# Patient Record
Sex: Female | Born: 1935
Health system: Southern US, Community
[De-identification: ages and names within clinical notes are randomized; demographics above are authoritative.]

## PROBLEM LIST (undated history)

## (undated) DIAGNOSIS — T4145XA Adverse effect of unspecified anesthetic, initial encounter: Secondary | ICD-10-CM

## (undated) DIAGNOSIS — I4819 Other persistent atrial fibrillation: Secondary | ICD-10-CM

## (undated) DIAGNOSIS — Z950 Presence of cardiac pacemaker: Secondary | ICD-10-CM

## (undated) DIAGNOSIS — C801 Malignant (primary) neoplasm, unspecified: Secondary | ICD-10-CM

## (undated) DIAGNOSIS — I4719 Other supraventricular tachycardia: Secondary | ICD-10-CM

## (undated) DIAGNOSIS — I340 Nonrheumatic mitral (valve) insufficiency: Secondary | ICD-10-CM

## (undated) DIAGNOSIS — N289 Disorder of kidney and ureter, unspecified: Secondary | ICD-10-CM

## (undated) DIAGNOSIS — I10 Essential (primary) hypertension: Secondary | ICD-10-CM

## (undated) DIAGNOSIS — R112 Nausea with vomiting, unspecified: Secondary | ICD-10-CM

## (undated) DIAGNOSIS — I5042 Chronic combined systolic (congestive) and diastolic (congestive) heart failure: Secondary | ICD-10-CM

## (undated) DIAGNOSIS — I351 Nonrheumatic aortic (valve) insufficiency: Secondary | ICD-10-CM

## (undated) DIAGNOSIS — E875 Hyperkalemia: Secondary | ICD-10-CM

## (undated) DIAGNOSIS — N183 Chronic kidney disease, stage 3 unspecified: Secondary | ICD-10-CM

## (undated) DIAGNOSIS — E213 Hyperparathyroidism, unspecified: Secondary | ICD-10-CM

## (undated) DIAGNOSIS — I495 Sick sinus syndrome: Secondary | ICD-10-CM

## (undated) DIAGNOSIS — Z9289 Personal history of other medical treatment: Secondary | ICD-10-CM

## (undated) DIAGNOSIS — M1990 Unspecified osteoarthritis, unspecified site: Secondary | ICD-10-CM

## (undated) DIAGNOSIS — Z8669 Personal history of other diseases of the nervous system and sense organs: Secondary | ICD-10-CM

## (undated) DIAGNOSIS — I471 Supraventricular tachycardia: Secondary | ICD-10-CM

## (undated) DIAGNOSIS — I420 Dilated cardiomyopathy: Secondary | ICD-10-CM

## (undated) DIAGNOSIS — I493 Ventricular premature depolarization: Secondary | ICD-10-CM

## (undated) DIAGNOSIS — Z8639 Personal history of other endocrine, nutritional and metabolic disease: Secondary | ICD-10-CM

## (undated) DIAGNOSIS — G459 Transient cerebral ischemic attack, unspecified: Secondary | ICD-10-CM

## (undated) DIAGNOSIS — R0602 Shortness of breath: Secondary | ICD-10-CM

## (undated) DIAGNOSIS — Z9889 Other specified postprocedural states: Secondary | ICD-10-CM

## (undated) DIAGNOSIS — E7800 Pure hypercholesterolemia, unspecified: Secondary | ICD-10-CM

## (undated) DIAGNOSIS — T8859XA Other complications of anesthesia, initial encounter: Secondary | ICD-10-CM

## (undated) DIAGNOSIS — K219 Gastro-esophageal reflux disease without esophagitis: Secondary | ICD-10-CM

## (undated) HISTORY — PX: TOE AMPUTATION: SHX809

## (undated) HISTORY — DX: Other specified postprocedural states: Z98.890

## (undated) HISTORY — DX: Supraventricular tachycardia: I47.1

## (undated) HISTORY — DX: Other supraventricular tachycardia: I47.19

## (undated) HISTORY — DX: Nonrheumatic aortic (valve) insufficiency: I35.1

## (undated) HISTORY — DX: Personal history of other diseases of the nervous system and sense organs: Z86.69

## (undated) HISTORY — PX: BUNIONECTOMY: SHX129

## (undated) HISTORY — PX: FOOT SURGERY: SHX648

## (undated) HISTORY — DX: Other persistent atrial fibrillation: I48.19

## (undated) HISTORY — PX: SHOULDER SURGERY: SHX246

## (undated) HISTORY — PX: EYE SURGERY: SHX253

## (undated) HISTORY — DX: Ventricular premature depolarization: I49.3

## (undated) HISTORY — DX: Disorder of kidney and ureter, unspecified: N28.9

## (undated) HISTORY — PX: VARICOSE VEIN SURGERY: SHX832

## (undated) HISTORY — DX: Hyperkalemia: E87.5

## (undated) HISTORY — DX: Sick sinus syndrome: I49.5

## (undated) HISTORY — DX: Chronic combined systolic (congestive) and diastolic (congestive) heart failure: I50.42

## (undated) HISTORY — DX: Dilated cardiomyopathy: I42.0

## (undated) HISTORY — DX: Nonrheumatic mitral (valve) insufficiency: I34.0

## (undated) HISTORY — PX: TONSILLECTOMY: SUR1361

## (undated) HISTORY — DX: Chronic kidney disease, stage 3 unspecified: N18.30

## (undated) HISTORY — DX: Personal history of other endocrine, nutritional and metabolic disease: Z86.39

## (undated) HISTORY — PX: SHOULDER ARTHROSCOPY W/ ROTATOR CUFF REPAIR: SHX2400

---

## 2000-04-19 ENCOUNTER — Encounter: Admission: RE | Admit: 2000-04-19 | Discharge: 2000-04-19 | Payer: Self-pay | Admitting: *Deleted

## 2000-04-19 ENCOUNTER — Encounter: Payer: Self-pay | Admitting: *Deleted

## 2000-09-25 HISTORY — PX: CARDIAC CATHETERIZATION: SHX172

## 2000-09-28 ENCOUNTER — Ambulatory Visit (HOSPITAL_COMMUNITY): Admission: RE | Admit: 2000-09-28 | Discharge: 2000-09-29 | Payer: Self-pay | Admitting: *Deleted

## 2001-12-23 ENCOUNTER — Encounter: Payer: Self-pay | Admitting: *Deleted

## 2001-12-23 ENCOUNTER — Ambulatory Visit (HOSPITAL_COMMUNITY): Admission: RE | Admit: 2001-12-23 | Discharge: 2001-12-23 | Payer: Self-pay | Admitting: *Deleted

## 2002-06-12 ENCOUNTER — Inpatient Hospital Stay (HOSPITAL_COMMUNITY): Admission: AD | Admit: 2002-06-12 | Discharge: 2002-06-15 | Payer: Self-pay | Admitting: Cardiology

## 2002-07-11 ENCOUNTER — Encounter: Payer: Self-pay | Admitting: Gastroenterology

## 2002-07-11 ENCOUNTER — Encounter: Admission: RE | Admit: 2002-07-11 | Discharge: 2002-07-11 | Payer: Self-pay | Admitting: Gastroenterology

## 2002-07-14 ENCOUNTER — Encounter: Admission: RE | Admit: 2002-07-14 | Discharge: 2002-07-14 | Payer: Self-pay | Admitting: Endocrinology

## 2002-07-24 ENCOUNTER — Encounter: Payer: Self-pay | Admitting: Endocrinology

## 2002-07-24 ENCOUNTER — Encounter: Admission: RE | Admit: 2002-07-24 | Discharge: 2002-07-24 | Payer: Self-pay | Admitting: Endocrinology

## 2002-11-20 ENCOUNTER — Inpatient Hospital Stay (HOSPITAL_COMMUNITY): Admission: AD | Admit: 2002-11-20 | Discharge: 2002-11-22 | Payer: Self-pay | Admitting: Cardiology

## 2003-10-13 ENCOUNTER — Emergency Department (HOSPITAL_COMMUNITY): Admission: EM | Admit: 2003-10-13 | Discharge: 2003-10-13 | Payer: Self-pay | Admitting: Emergency Medicine

## 2003-12-19 ENCOUNTER — Ambulatory Visit (HOSPITAL_COMMUNITY): Admission: RE | Admit: 2003-12-19 | Discharge: 2003-12-19 | Payer: Self-pay | Admitting: Cardiology

## 2004-02-14 ENCOUNTER — Encounter: Admission: RE | Admit: 2004-02-14 | Discharge: 2004-02-14 | Payer: Self-pay | Admitting: Family Medicine

## 2004-08-07 ENCOUNTER — Encounter: Admission: RE | Admit: 2004-08-07 | Discharge: 2004-08-07 | Payer: Self-pay | Admitting: Endocrinology

## 2004-12-02 ENCOUNTER — Ambulatory Visit (HOSPITAL_COMMUNITY): Admission: RE | Admit: 2004-12-02 | Discharge: 2004-12-02 | Payer: Self-pay | Admitting: Cardiology

## 2006-03-16 ENCOUNTER — Ambulatory Visit (HOSPITAL_COMMUNITY): Admission: RE | Admit: 2006-03-16 | Discharge: 2006-03-16 | Payer: Self-pay | Admitting: Cardiology

## 2007-07-28 ENCOUNTER — Encounter: Admission: RE | Admit: 2007-07-28 | Discharge: 2007-07-28 | Payer: Self-pay | Admitting: Cardiology

## 2007-08-22 ENCOUNTER — Ambulatory Visit (HOSPITAL_COMMUNITY): Admission: RE | Admit: 2007-08-22 | Discharge: 2007-08-22 | Payer: Self-pay | Admitting: Cardiology

## 2007-11-17 ENCOUNTER — Other Ambulatory Visit: Admission: RE | Admit: 2007-11-17 | Discharge: 2007-11-17 | Payer: Self-pay | Admitting: Obstetrics & Gynecology

## 2007-11-28 ENCOUNTER — Encounter: Admission: RE | Admit: 2007-11-28 | Discharge: 2007-11-28 | Payer: Self-pay | Admitting: Obstetrics & Gynecology

## 2008-07-30 ENCOUNTER — Encounter: Admission: RE | Admit: 2008-07-30 | Discharge: 2008-07-30 | Payer: Self-pay | Admitting: Cardiology

## 2008-08-06 ENCOUNTER — Ambulatory Visit (HOSPITAL_COMMUNITY): Admission: RE | Admit: 2008-08-06 | Discharge: 2008-08-06 | Payer: Self-pay | Admitting: Cardiology

## 2009-09-02 ENCOUNTER — Ambulatory Visit (HOSPITAL_COMMUNITY): Admission: RE | Admit: 2009-09-02 | Discharge: 2009-09-02 | Payer: Self-pay | Admitting: Cardiology

## 2009-10-28 DIAGNOSIS — I10 Essential (primary) hypertension: Secondary | ICD-10-CM

## 2009-10-28 DIAGNOSIS — I4819 Other persistent atrial fibrillation: Secondary | ICD-10-CM

## 2009-10-29 ENCOUNTER — Ambulatory Visit: Payer: Self-pay | Admitting: Critical Care Medicine

## 2009-10-29 ENCOUNTER — Telehealth: Payer: Self-pay | Admitting: Critical Care Medicine

## 2009-10-29 DIAGNOSIS — E785 Hyperlipidemia, unspecified: Secondary | ICD-10-CM | POA: Insufficient documentation

## 2009-10-29 DIAGNOSIS — R942 Abnormal results of pulmonary function studies: Secondary | ICD-10-CM | POA: Insufficient documentation

## 2010-05-12 ENCOUNTER — Encounter: Admission: RE | Admit: 2010-05-12 | Discharge: 2010-05-12 | Payer: Self-pay | Admitting: Gastroenterology

## 2010-08-25 ENCOUNTER — Ambulatory Visit (HOSPITAL_COMMUNITY): Admission: RE | Admit: 2010-08-25 | Discharge: 2010-08-25 | Payer: Self-pay | Admitting: Cardiology

## 2010-09-02 ENCOUNTER — Encounter: Payer: Self-pay | Admitting: Critical Care Medicine

## 2010-11-16 ENCOUNTER — Encounter: Payer: Self-pay | Admitting: Family Medicine

## 2010-11-25 NOTE — Progress Notes (Signed)
Summary: test results/PFTs received  Phone Note Outgoing Call   Reason for Call: Discuss lab or test results Summary of Call: Call the pt and tell her CXR is normal.  She should stay on pacerone for now.  I am awaiting results of prior PFT results and will call her when available   Initial call taken by: Storm Frisk MD,  October 29, 2009 3:02 PM  Follow-up for Phone Call        Crystal, the PFT results are in PW's look at folder awaiting his review.Michel Bickers CMA  October 29, 2009 3:33 PM  ATC pt.  [employer].  will try back later.  Gweneth Dimitri RN  October 29, 2009 3:41 PM   Additional Follow-up for Phone Call Additional follow up Details #1::        called, spoke with pt.  Pt informed of cxr results and recs stated above per PW.  She verbalized understanding of instructions.  Pt aware we will call back after PW reveiws prior PFTs.  PFTs are in PW's to do folder.  Will forward to PW as FYI.   Additional Follow-up by: Gweneth Dimitri RN,  October 29, 2009 3:57 PM    Additional Follow-up for Phone Call Additional follow up Details #2::    noted   Follow-up by: Storm Frisk MD,  October 30, 2009 12:30 PM

## 2010-11-25 NOTE — Assessment & Plan Note (Signed)
Summary: Pulmonary Consultation   Copy to:  Dr. Armanda Magic Primary Provider/Referring Provider:  Dr. Tally Joe  CC:  Pulmonary Consult-abnormal PFT.  occ SOB with activity x15yr.Marland Kitchen  History of Present Illness: Pulmonary Consultation  77 yo WF referred for eval of abn PFTs, low DLCO on amiodarone.  ? is there pulmonary toxicity from amiodarone.       This is a 77 year old female who presents for consultation at the request of Dr. Armanda Magic for initial Pulmonary consult.  The patient presents with fatigue and shortness of breath with exertion, but has no history of fever, failure to thrive, weight loss, night sweats, nosebleeds, easy bruising, heartburn, nausea/vomiting, unable to take oral fluids/nourishment, chest pain, cough, shortness of breath, hemoptysis, joint swelling, leg swelling, peripheral edema, and URI symptoms.  Today's visit is for evaluation only.  The patient's current symptoms are unchanged.  Prior evaluation and treatment (see reports for full details) has included CXR and PFT's.  The patient has questions or concerns today regarding diagnosis.   Pt with hx of chronic atrial fibrillation.  PT on pacerone 100mg /d for 6years.   Now: only dyspneic with severe exertion, more just fatigued.  No real cough.  If lays flat will cough.  No wheeze.  No prior asthma or copd dx.  Never smoker.   No chest pain.  No mucous.  Notes some edema in ankles.   If goes shopping gets fatigued with exertion.  Preventive Screening-Counseling & Management  Alcohol-Tobacco     Smoking Status: never     Passive Smoke Exposure: yes  Comments: father was a smoker   Current Medications (verified): 1)  Multivitamins  Tabs (Multiple Vitamin) .... Once Daily 2)  Simvastatin 20 Mg Tabs (Simvastatin) .... Once Daily 3)  Amlodipine Besylate 2.5 Mg Tabs (Amlodipine Besylate) .... Once Daily 4)  Lorazepam 0.5 Mg Tabs (Lorazepam) .... As Needed 5)  Metoprolol Succinate 50 Mg Xr24h-Tab (Metoprolol  Succinate) .... 1/2 Tab Once Daily 6)  Pacerone 200 Mg Tabs (Amiodarone Hcl) .... 1/2 Tab Once Daily 7)  Furosemide 20 Mg Tabs (Furosemide) .... Once Daily 8)  Coumadin 5 Mg Tabs (Warfarin Sodium) .... As Directed 9)  Prilosec Otc 20 Mg Tbec (Omeprazole Magnesium) .... Once Daily  Allergies (verified): No Known Drug Allergies  Past History:  Past medical, surgical, family and social histories (including risk factors) reviewed, and no changes noted (except as noted below).  Past Medical History: HYPERLIPIDEMIA (ICD-272.4) HYPERTENSION (ICD-401.9) PAROXYSMAL ATRIAL FIBRILLATION (ICD-427.31) Abnormal DLCO on Amiodarone     -DLCO:  2007:61%    2008:80%  2009:65%   2010: 50%  Past Surgical History: c section x2 varicose vein stripping foot surgery knee surgery rotator cuff Appendectomy  Family History: Reviewed history and no changes required. heart disease-mother, father sister deceased from breast ca  Social History: Reviewed history and no changes required. Patient never smoked.  married 3 children Homemaker Smoking Status:  never Passive Smoke Exposure:  yes  Review of Systems       The patient complains of shortness of breath with activity, irregular heartbeats, acid heartburn, indigestion, and hand/feet swelling.  The patient denies shortness of breath at rest, productive cough, non-productive cough, coughing up blood, chest pain, loss of appetite, weight change, abdominal pain, difficulty swallowing, sore throat, tooth/dental problems, headaches, nasal congestion/difficulty breathing through nose, sneezing, itching, ear ache, anxiety, depression, joint stiffness or pain, rash, change in color of mucus, and fever.    Vital Signs:  Patient profile:  77 year old female Height:      65 inches Weight:      185.13 pounds BMI:     30.92 O2 Sat:      98 % on Room air Temp:     97.4 degrees F oral Pulse rate:   65 / minute BP sitting:   120 / 80  (left arm) Cuff  size:   regular  Vitals Entered By: Gweneth Dimitri RN (October 29, 2009 10:43 AM)  O2 Flow:  Room air CC: Pulmonary Consult-abnormal PFT.  occ SOB with activity x82yr. Comments Medications reviewed with patient Gweneth Dimitri RN  October 29, 2009 10:43 AM    Physical Exam  Additional Exam:  Gen: Pleasant, well-nourished, in no distress,  normal affect ENT: No lesions,  mouth clear,  oropharynx clear, no postnasal drip Neck: No JVD, no TMG, no carotid bruits Lungs: No use of accessory muscles, no dullness to percussion, clear without rales or rhonchi Cardiovascular: RRR, heart sounds normal, no murmur or gallops, no peripheral edema Abdomen: soft and NT, no HSM,  BS normal Musculoskeletal: No deformities, no cyanosis or clubbing Neuro: alert, non focal Skin: Warm, no lesions or rashes    CXR  Procedure date:  10/29/2009  Findings:      IMPRESSION: Stable chest x-ray with no evidence of acute cardiac or pulmonary process.    Pulmonary Function Test Date: 09/02/2009 Gender: Female  Pre-Spirometry FVC    Value: 2.90 L/min   Pred: 2.73 L/min     % Pred: 106 % FEV1    Value: 2.46 L     Pred: 1.91 L     % Pred: 129 % FEV1/FVC  Value: 85 %     Pred: 75 %    FEF 25-75  Value: 3.12 L/min   Pred: 2.17 L/min     % Pred: 107 %  Lung Volumes DLCO    Value: 24.26 %   % Pred: 50 % DLCO/VA  Value: 2.65 %   % Pred: 55 %  Comments: Moderate reduction in DLCO, with drop from 2009 values  Impression & Recommendations:  Problem # 1:  PULMONARY FUNCTION TESTS, ABNORMAL (ICD-794.2) Assessment Unchanged Abnormal DLCO in setting of amiodarone use.  CXR is normal on todays exam.  No physical findings of primary lung disease.Spirometry is normal.  Compared to 2007 DLCO has fallen from 65% pred to 50% pred.  This is consistent with either pulm vascular disease or a true diffusion defect due to amiodarone.  The lack of physical findings or CXR findings for pulm interstitial disease speaks  against this being from amiodarone especially since pt is on such a low dose. plan Repeat PFTs in 6months and track pts status closely Ok to continue low dose amiodarone for now. PT to call sooner if symptoms progress. Orders: New Patient Level IV (16109) T-2 View CXR (71020TC)  Complete Medication List: 1)  Multivitamins Tabs (Multiple vitamin) .... Once daily 2)  Simvastatin 20 Mg Tabs (Simvastatin) .... Once daily 3)  Amlodipine Besylate 2.5 Mg Tabs (Amlodipine besylate) .... Once daily 4)  Lorazepam 0.5 Mg Tabs (Lorazepam) .... As needed 5)  Metoprolol Succinate 50 Mg Xr24h-tab (Metoprolol succinate) .... 1/2 tab once daily 6)  Pacerone 200 Mg Tabs (Amiodarone hcl) .... 1/2 tab once daily 7)  Furosemide 20 Mg Tabs (Furosemide) .... Once daily 8)  Coumadin 5 Mg Tabs (Warfarin sodium) .... As directed 9)  Prilosec Otc 20 Mg Tbec (Omeprazole magnesium) .... Once daily  Patient Instructions: 1)  Ok to stay on Pacerone 2)  Obtain chest xray today 3)  I will call you with Chest xray results  4)  Suspect you will need repeat pulmonary funcitons in 6-8 months, I will review prior PFTs and let you know 5)  No specific return to pulmonary needed   Appended Document: Pulmonary Consultation fax Tally Joe;  Armanda Magic

## 2011-03-13 NOTE — H&P (Signed)
NAME:  Molly [name], Molly [name]                       ACCOUNT NO.:  [id number]   MEDICAL RECORD NO.:  [id number]                   PATIENT TYPE:  INP   LOCATION:  3741                                 FACILITY:  [facility]   PHYSICIAN:  Traci R. Mayford Knife, M.D.               DATE OF BIRTH:  1933/04/21   DATE OF ADMISSION:  06/12/2002  DATE OF DISCHARGE:                                HISTORY & PHYSICAL   PRIMARY CARE PHYSICIAN:  Tama Headings. Marina Goodell, M.D.   IMPRESSION:  (As dictated by Dr. Armanda Magic).  1. Paroxysmal atrial fibrillation; onset February 2003.     A. She is intolerant to Cardizem.  She had breakthrough atrial        fibrillation on Toprol.  She is now admitted for initiation of        vasotherapy for consistent maintenance of normal sinus rhythm.     B. (March 2003)  2-D echocardiogram revealing an ejection fraction of 45        to 50% with normal left ventricular size and function.     C. Cardiac catheterization in December 2001 revealing normal coronary        arteries (prior false positive Cardiolite).     D. The patient has had normal TSH checked in the past.  2. Systemic anticoagulation secondary to paroxysmal atrial fibrillation.  3. History of lower extremity swelling, improved on Lasix.  4. History of hypokalemia, prior daily dosing of K-Dur.  5. Dyspnea, question etiology, though has significantly improved.  She was     referred back to Dr. Marina Goodell for potential referral to a pulmonologist for     further evaluation of her shortness of breath.   PLAN:  (As dictated by Dr. Armanda Magic).  1. Admit to telemetry for initiation of Betapace therapy.  2. Monitor QTC interval for prolongation.  3. Baseline comprehensive metabolic panel and coagulation panel.  4. Continue low-dose K-Dur pending basic metabolic panel results.  5. Follow coagulation studies.  6. If the patient maintains normal sinus rhythm on Betapace, will consider     discontinuation of Coumadin therapy.   HISTORY OF PRESENT ILLNESS:  The patient is a very pleasant, 77 year old  female with a history of atrial fibrillation, onset approximately February  this year.  She has had intolerance to Cardizem in the past and breakthrough  atrial fibrillation on her Toprol dosing.  She has had a prior 2-D  echocardiogram revealing lower range of normal ejection fraction but normal  left ventricular size and function, and normal cardiac valves.  She is on  chronic Coumadin therapy.  As mentioned above, she is on Toprol and digoxin  as well.  The patient also has mild arthritis.   PAST SURGICAL HISTORY:  Significant for  1. Cesarean section x2.  2. Lower extremity vein stripping 30 years ago.  3. Tonsillectomy.  4. Right rotator cuff surgery.  5. Right knee arthroscopic  surgery.  6. Right bunionectomy.   ALLERGIES:  No known drug allergies.  Okay with seafood, shellfish and  iodinated products.   MEDICATIONS ON ADMISSION:  A.  Toprol XL 75 mg p.o. q.d.  B.  Lasix 20 mg p.o. q.d.  C.  Digitek 0.125 mg p.o. q.d.  D.  Coumadin 5 mg on Tuesday, Thursday, Saturday and Sunday, and 2.5 mg on  Monday, Wednesday and Friday.  E.  Vioxx p.r.n., not often taken.   SOCIAL HISTORY/HABITS:  The patient has been married for 50 years.  She is a  Futures trader.  She has three children alive and well.  She has a grandchild,  age 22 years, who she assists in the care of.  EtOH/tobacco:  Negative.   FAMILY HISTORY:  Noncontributory.   REVIEW OF SYSTEMS:  As in history of present illness and past surgical  history.  Otherwise, she denies problems with lightheadedness, dizziness,  syncope and near-syncopal episodes.  Denies current dyspnea.  Negative pedal  edema.  Negative orthopnea.  Negative paroxysmal nocturnal dyspnea.  Episodic dysphagia.  Denies constipation, diarrhea, melena and bright red  blood per rectum.  Negative for dysuria and hematuria.  Mild arthritic-type  complaints affecting her knees and right  shoulder.  She does wear glasses  and has upper partial dentures.  She has lost 15 pounds in the last four  months intentionally.   PHYSICAL EXAMINATION:  (As dictated by Dr. Armanda Magic)  VITAL SIGNS:  Blood pressure is 136/72, heart rate 64 and regular,  respiratory rate 18, temperature 98.2, oxygen saturations 97%.   GENERAL:  She is a well-developed, pleasant, and conversant, 77 year old  female in no acute distress.   HEAD, EYES, EARS, NOSE, THROAT:  Brisk bilateral carotid upstroke without  bruit.  No JVD and no thyromegaly.   CHEST:  Lung sounds clear, equal bilateral excursion.  Negative  costovertebral angle tenderness.   CARDIOVASCULAR:  Regular rate and rhythm without murmurs, rubs or gallops.  Normal S1 and S2.   ABDOMEN:  Soft, nondistended, normoactive bowel sounds.  Negative abdominal  aorta, renal or femoral bruit.  Nontender to applied pressure, no masses and  no organomegaly appreciated.   EXTREMITIES:  Slight lower extremity swelling, distal pulses intact.   NEUROLOGIC:  Cranial nerves II through XII grossly intact.  Alert and  oriented times three.   GENITAL/RECTAL:  Examination is deferred.   LABORATORY DATA:  Hemoglobin 13.9, hematocrit 40.8, white blood cell count  8.5, and platelet count 226,000.  Sodium 141, potassium 4.7, chloride 107,  CO2 28, BUN 16, creatinine 0.9, glucose 97.  Liver function tests are within  normal range.  Calcium is 10.2.  Chest x-ray from December 23, 2001,  revealed no active disease.  Pro time is 23.2, INR 2.4.    Salomon Fick, N.P.                       Traci R. Mayford Knife, M.D.   MES/MEDQ  D:  06/12/2002  T:  06/14/2002  Job:  [id number]

## 2011-03-13 NOTE — Cardiovascular Report (Signed)
[place]  Patient:    Molly [name], Molly [name]                    MRN: [id number] Adm. Date:  [id number] Attending:  Mora Appl CC:         Willis Modena. Dreiling, M.D.   Cardiac Catheterization  INDICATIONS:  Paroxysmal atrial fibrillation and fixed defects in the anteroapical region without redistribution.  There was anterior hypokinesis on echo associated with poor exercise tolerance.  The patient was only able to exercise for 3 minutes 47 seconds utilizing Bruce protocol.  Based on these findings, it was elected to proceed with left heart catheterization.  DESCRIPTION OF PROCEDURE:  After obtaining written informed consent, the patient was brought to the cardiac catheterization lab in the postabsorptive state.  Preoperative sedation was achied using Valium p.o. and IV Versed. Following appropriate sedation, both groins were prepped and draped in the usual sterile fashion.  Access to the right femoral artery was obtained, but was unable to navigate the artery, subsequently the left femoral region was prepped and draped in the usual sterile fashion.  Local anesthesia was achieved using 1% Xylocaine.  A 6 French hemostasis sheath was placed into the left femoral artery using the modified Seldinger technique.  Selective coronary angiography was performed using the JL4 and JR4 Judkins catheters. Single plane ventriculogram was performed in the RAO position using a 6 French pigtail curved catheter.  Following this procedure, the JR4 was brought down to the level of the distal iliac and the right iliac was shown to be patent. The films were reviewed, there was no identifiable disease.  The patient was transferred to the holding area.  The hemostasis sheath was removed. Hemostasis was achieved using digital pressure.  No immediate complications were noted.  Single plane ventriculogram revealed mild anterior hypokinesis.  The mitral valve inlet was  elongated with some disproportional upper septal motion. This may have accounted for what appeared to be hypokinesis on the echo.  There was also significant calcification of the anterior mitral valve inlet.  CORONARY ANGIOGRAPHY:  The left main coronary artery was long and bifurcated into the left anterior descending and circumflex vessel.  There was no significant disease in the left main coronary artery.  Left anterior descending.  The left anterior descending gave rise to a small D1, D2, and D3, and ended as an apical recurrent branch.  There was no significant disease in the left anterior descending or its branches.  Circumflex.  The circumflex vessel gave rise to a high takeoff of the first obtuse marginal and a second obtuse marginal, and ended as an AV groove vessel. There was no significant disease in the circumflex or its branches.  Right coronary artery.  The right coronary artery was dominant giving rise to a small PDA and a moderate size PL branch.  There was no significant disease in the right coronary artery or its branches.  IMPRESSION: 1. False positive stress Cardiolite. 2. Anterior hypokinesis may be explained by the disproportional upper septal    motion making the views of the echo off axis.  There is mild hypokinesis    with an ejection fraction of 50% and normal wall motion.  The patient is    noted to have supraventricular tachycardia and paroxysmal atrial    fibrillation.  This may be corrolated with the calcification in the    anterior mitral valve inlet.  RECOMMENDATIONS:  No further studies is indicated.  The patient is currently maintaining sinus rhythm.  I suspect her poor exercise tolerance may be related to deconditioning. DD:  09/28/00 TD:  09/28/00 Job: [id number]

## 2011-03-13 NOTE — Discharge Summary (Signed)
NAME:  Molly [name], Molly [name]                       ACCOUNT NO.:  [id number]   MEDICAL RECORD NO.:  [id number]                   PATIENT TYPE:  INP   LOCATION:  3741                                 FACILITY:  [facility]   PHYSICIAN:  Armanda Magic, M.D.                  DATE OF BIRTH:  [birth year]   DATE OF ADMISSION:  06/12/2002  DATE OF DISCHARGE:  06/15/2002                                 DISCHARGE SUMMARY   PRIMARY CARE PHYSICIAN:  Tama Headings. Marina Goodell, M.D.   DISCHARGE DIAGNOSES:  1. Paroxysmal atrial flutter, electively admitted for initiation of Betapace     therapy. She had no breakthrough flutter during the course of this     admission. Her QTC remained stable and not prolonged. Did have underlying     first degree arteriovenous block. Betapace was initiated at 80 p.o.     b.i.d. but then held evening of 06/14/2002 secondary to bradycardia at 38     beats per minute. Subsequently heart rate maintained 50s to at 80s     without excessive bradycardia; occasional aberrant APCs versus PVCs.  2. Standard anticoagulation secondary to history of paroxysmal atrial     fibrillation. Continued on her Coumadin during the course of admission.     ProTime/INR within goal range at 24/2.5 at time of discharge.  3. History of hypokalemia supplemented with potassium. Admission serum     potassium was 4.7. Her  K-Dur was held, and serum potassium remained in     good range. Her K-Dur was discontinued at time of discharge.   PLAN:  1. The patient discharged home in stable condition.  2. Discharge medications:     A. (New) Betapace 40 mg p.o. b.i.d.     B. Lasix 20 mg p.o. q.d.     C. Coumadin 5 mg tablets, one tablet on Saturday, Sunday, Tuesday, and        Thursday and a half a tablet Monday, Wednesday, and Friday evening.  3. Special instructions:  Stop Toprol, stop digoxin/Lanoxin, stop Theo-Dur.  4. Followup:     A. Dr. Armanda Magic Wednesday, 9/2, at 9:30 a.m.     B. To see Dawn in our Coumadin  clinic Friday, 9/5, at 2:15 p.m.     C. Wednesday, 8/27, at 11:45 a.m., we will have EKG done with our nurse        assisting for possibility of prolongation of QTC on Betapace.   HISTORY OF PRESENT ILLNESS:  [name] is a very pleasant 77-year-  old female with a history of paroxysmal atrial fibrillation who is know to  be intolerant to Cardizem; breaks through atrial fibrillation on Toprol. She  was admitted for initiation for Betapace therapy towards more consistent  maintenance of NSR. For the details of the hospital admission, see details  above.   PAST MEDICAL HISTORY:  1. PAF.     A. Onset noted  2/03. Found to be intolerant to Cardizem. Breakthrough        atrial fibrillation on Toprol.     B. (3/03) Two-D echocardiogram revealing EF 45 to 50%, normal LV size and        function.     C. (12/01) Cardiac catheterization revealing normal coronary arteries        (prior false positive Cardiolite).     D. Normal TSH, checked in the past.  2. Standard anticoagulation secondary to paroxysmal atrial fibrillation.  3. History of lower extremity swelling improved on Lasix.  4. History of hypokalemia, prior daily dosing of K-Dur.  5. Dyspnea of uncertain etiology, though has significantly improved. She was     referred back to Dr. Marina Goodell for potential referral to pulmonologist for     further evaluation for her shortness of breath.   PAST MEDICAL HISTORY:  1. C-section x2.  2. Lower extremity vein stripping 30 years earlier.  3. Tonsillectomy.  4. Right rotator cuff surgery.  5. Right knee arthroscopic surgery.  6. Right bunionectomy.   LABORATORY DATA:  Sodium 141, potassium 4.7, chloride 107, CO2 28, BUN 16,  creatinine 0.9, glucose 97. LFTs within normal range. On 06/12/02, ProTime of  23.2, INR 2.4. WBC 8.4, hemoglobin 13.9, hematocrit 40.3, platelets 226. On  8/24, EKG revealed sinus rhythm at 61 beats per minute, QTC of 418  milliseconds.   Total time preparing  discharge:  Greater than 40 minutes including dictating  this discharge summary, filling out and reviewing discharge instructions  with patient, and calling in prescriptions.     Salomon Fick, N.P.                       Armanda Magic, M.D.    MES/MEDQ  D:  07/27/2002  T:  07/31/2002  Job:  [id number]   cc:   Tama Headings. Marina Goodell, M.D.

## 2011-03-13 NOTE — Discharge Summary (Signed)
NAME:  Molly [name], Molly [name]                       ACCOUNT NO.:  [id number]   MEDICAL RECORD NO.:  [id number]                   PATIENT TYPE:  INP   LOCATION:  2011                                 FACILITY:  [facility]   PHYSICIAN:  Armanda Magic, M.D.                  DATE OF BIRTH:  [birth year]   DATE OF ADMISSION:  11/20/2002  DATE OF DISCHARGE:  11/22/2002                                 DISCHARGE SUMMARY   PROCEDURES:  PFTs with DLCO, TSH, PA/lateral chest x-ray, LFTs.   DISCHARGE DIAGNOSES:  1. History of paroxysmal atrial fibrillation, electively admitted for     loading with amiodarone.  Baseline QTC, TSH, LFTs, and PFTs were within     acceptable range.  However, PFTs with moderate decreased DLCO computer     interpretation; pulmonologist review pending.  She has a history of being     intolerant to Cardizem.  Amiodarone initiated at 400 mg p.o. b.i.d. (this     is a slightly lower dose setting of heart rate approximately 60s with     some post-conversion pauses, a little over one second).  The patient     tolerated amiodarone load without excessive prolongation of QTC.  QTC of     410 ms at the time of discharge.  2. Systemic anticoagulation secondary to #1.  Continued on Coumadin at a     fractionation.  Admission pro time of 21, INR of 2.0.  At the time of     discharge, the pro time was 22.7, INR of 2.5.  Coumadin was adjusted     downward slightly, setting of anticoagulation for potentiating affects of     Coumadin.  3. History of lower extremity swelling, improved on Lasix.  4. History of hypokalemia.  Serum potassium okay at 4.5 this admission.  5. History of DLE, uncertain etiology.   PLAN:  1. The patient is discharged home in stable condition.  2. Discharge medications:     a. New:  Amiodarone 200 mg two tabs p.o. b.i.d. for two weeks, then        decreased to two tabs p.o. daily for one week, then decreased to one        tab p.o. daily thereafter.  3. Discharge  instructions:  Stop Toprol.  4. Follow up:  [facility] Friday, 11/24/02, at 9:30 a.m.  Also will     obtain an EKG at that time with office nurse assessing for possible QTC     prolongation.  Will see Dr. Armanda Magic Thursday, 12/07/02, at 10:30 a.m.  5. The patient has modest reimbursement for prescription medications by     [employer].  We will enroll her in the Amiodarone Drug Assistance Program via     the pharmaceutical company.   HISTORY OF PRESENT ILLNESS:  Molly [name] is a very pleasant 77-year-  old female with a history of paroxysmal atrial fibrillation.  This was first  identified in 2/03.  She previously has been intolerant to Cardizem and has  had breakthrough atrial fibrillation in the past on Toprol.  She was  admitted last August for initiation of Betapace therapy, but this was  subsequently discontinued secondary to excessive bradycardia.  She has been  maintained on systemic anticoagulation within therapeutic range on Coumadin.  She now presented for elective admission for initiation of amiodarone  therapy.  Her Toprol was discontinued.  Further details of hospital  admission as above.  Uneventful hospital course.   LABORATORY DATA:  WBC of 6, hemoglobin 14.2, hematocrit 42.3, platelets 208.  Admission pro time of 21.0, INR 2, PTC of 34.  At the time of discharge, pro  time 22.7, INR of 2.5.  Admission sodium of 141, potassium 4.5, chloride  103, CO2 29, glucose 101, BUN 18, creatinine 0.9.  LFTs all within normal  range.  Calcium 10.9.  TSH within normal range at 3.886.  At the time of  discharge, her EKG revealed sinus bradycardia with first degree AV block  with occasional PVCs.  Left anterior fascicular block.  Nonspecific T wave  abnormality.  Intraventricular conduction delay.  QTC okay at 410 msec.  Late R wave progression.  PFTs from 11/21/02 revealed spirometry within  normal limits.  Moderate decreased in DLCO.  The patient did note some  difficulty with  10 second breath hold on DLCO testing.   Total time preparing discharge greater than 40 minutes, including dictating  this discharge summary, filling out and reviewing discharge instructions  with patient.     Salomon Fick, N.P.                       Armanda Magic, M.D.    MES/MEDQ  D:  11/22/2002  T:  11/22/2002  Job:  [id number]   cc:   Tama Headings. Marina Goodell, M.D.  [address]  [place]  [place] [address]  Fax: [phone]

## 2011-08-31 ENCOUNTER — Ambulatory Visit
Admission: RE | Admit: 2011-08-31 | Discharge: 2011-08-31 | Disposition: A | Discharge status: Home / Self Care | Payer: Medicare Other | Source: Ambulatory Visit | Attending: Cardiology | Admitting: Cardiology

## 2011-08-31 ENCOUNTER — Other Ambulatory Visit (HOSPITAL_COMMUNITY): Payer: Self-pay | Admitting: Cardiology

## 2011-08-31 ENCOUNTER — Other Ambulatory Visit: Payer: Self-pay | Admitting: Cardiology

## 2011-08-31 DIAGNOSIS — Z79899 Other long term (current) drug therapy: Secondary | ICD-10-CM

## 2011-08-31 DIAGNOSIS — I4891 Unspecified atrial fibrillation: Secondary | ICD-10-CM

## 2011-09-22 ENCOUNTER — Encounter (HOSPITAL_COMMUNITY): Payer: Self-pay

## 2011-09-29 ENCOUNTER — Ambulatory Visit (HOSPITAL_COMMUNITY)
Admission: RE | Admit: 2011-09-29 | Discharge: 2011-09-29 | Disposition: A | Discharge status: Home / Self Care | Payer: Medicare Other | Source: Ambulatory Visit | Attending: Cardiology | Admitting: Cardiology

## 2011-09-29 DIAGNOSIS — Z79899 Other long term (current) drug therapy: Secondary | ICD-10-CM | POA: Insufficient documentation

## 2011-09-29 DIAGNOSIS — I10 Essential (primary) hypertension: Secondary | ICD-10-CM | POA: Insufficient documentation

## 2011-09-29 DIAGNOSIS — E785 Hyperlipidemia, unspecified: Secondary | ICD-10-CM | POA: Insufficient documentation

## 2011-09-29 DIAGNOSIS — R942 Abnormal results of pulmonary function studies: Secondary | ICD-10-CM | POA: Insufficient documentation

## 2011-09-29 DIAGNOSIS — I4891 Unspecified atrial fibrillation: Secondary | ICD-10-CM | POA: Insufficient documentation

## 2011-10-28 DIAGNOSIS — R609 Edema, unspecified: Secondary | ICD-10-CM | POA: Diagnosis not present

## 2011-10-28 DIAGNOSIS — M25579 Pain in unspecified ankle and joints of unspecified foot: Secondary | ICD-10-CM | POA: Diagnosis not present

## 2011-10-29 DIAGNOSIS — M25579 Pain in unspecified ankle and joints of unspecified foot: Secondary | ICD-10-CM | POA: Diagnosis not present

## 2011-11-02 DIAGNOSIS — I4891 Unspecified atrial fibrillation: Secondary | ICD-10-CM | POA: Diagnosis not present

## 2011-11-02 DIAGNOSIS — Z7901 Long term (current) use of anticoagulants: Secondary | ICD-10-CM | POA: Diagnosis not present

## 2011-11-09 DIAGNOSIS — M6240 Contracture of muscle, unspecified site: Secondary | ICD-10-CM | POA: Diagnosis not present

## 2011-11-09 DIAGNOSIS — M76829 Posterior tibial tendinitis, unspecified leg: Secondary | ICD-10-CM | POA: Diagnosis not present

## 2011-11-09 DIAGNOSIS — M2010 Hallux valgus (acquired), unspecified foot: Secondary | ICD-10-CM | POA: Diagnosis not present

## 2011-11-09 DIAGNOSIS — M2040 Other hammer toe(s) (acquired), unspecified foot: Secondary | ICD-10-CM | POA: Diagnosis not present

## 2011-11-10 DIAGNOSIS — H04129 Dry eye syndrome of unspecified lacrimal gland: Secondary | ICD-10-CM | POA: Diagnosis not present

## 2011-11-10 DIAGNOSIS — H401290 Low-tension glaucoma, unspecified eye, stage unspecified: Secondary | ICD-10-CM | POA: Diagnosis not present

## 2011-11-10 DIAGNOSIS — H40059 Ocular hypertension, unspecified eye: Secondary | ICD-10-CM | POA: Diagnosis not present

## 2011-11-10 DIAGNOSIS — H409 Unspecified glaucoma: Secondary | ICD-10-CM | POA: Diagnosis not present

## 2011-11-16 DIAGNOSIS — M76829 Posterior tibial tendinitis, unspecified leg: Secondary | ICD-10-CM | POA: Diagnosis not present

## 2011-11-18 DIAGNOSIS — M76829 Posterior tibial tendinitis, unspecified leg: Secondary | ICD-10-CM | POA: Diagnosis not present

## 2011-11-23 DIAGNOSIS — M76829 Posterior tibial tendinitis, unspecified leg: Secondary | ICD-10-CM | POA: Diagnosis not present

## 2011-11-25 DIAGNOSIS — M76829 Posterior tibial tendinitis, unspecified leg: Secondary | ICD-10-CM | POA: Diagnosis not present

## 2011-11-27 DIAGNOSIS — M76829 Posterior tibial tendinitis, unspecified leg: Secondary | ICD-10-CM | POA: Diagnosis not present

## 2011-11-30 DIAGNOSIS — I4891 Unspecified atrial fibrillation: Secondary | ICD-10-CM | POA: Diagnosis not present

## 2011-11-30 DIAGNOSIS — M76829 Posterior tibial tendinitis, unspecified leg: Secondary | ICD-10-CM | POA: Diagnosis not present

## 2011-11-30 DIAGNOSIS — Z7901 Long term (current) use of anticoagulants: Secondary | ICD-10-CM | POA: Diagnosis not present

## 2011-12-01 DIAGNOSIS — Z85828 Personal history of other malignant neoplasm of skin: Secondary | ICD-10-CM | POA: Diagnosis not present

## 2011-12-01 DIAGNOSIS — L570 Actinic keratosis: Secondary | ICD-10-CM | POA: Diagnosis not present

## 2011-12-01 DIAGNOSIS — L821 Other seborrheic keratosis: Secondary | ICD-10-CM | POA: Diagnosis not present

## 2011-12-01 DIAGNOSIS — C44319 Basal cell carcinoma of skin of other parts of face: Secondary | ICD-10-CM | POA: Diagnosis not present

## 2011-12-11 DIAGNOSIS — M76829 Posterior tibial tendinitis, unspecified leg: Secondary | ICD-10-CM | POA: Diagnosis not present

## 2011-12-15 DIAGNOSIS — M76829 Posterior tibial tendinitis, unspecified leg: Secondary | ICD-10-CM | POA: Diagnosis not present

## 2011-12-21 DIAGNOSIS — M76829 Posterior tibial tendinitis, unspecified leg: Secondary | ICD-10-CM | POA: Diagnosis not present

## 2011-12-21 DIAGNOSIS — M6240 Contracture of muscle, unspecified site: Secondary | ICD-10-CM | POA: Diagnosis not present

## 2012-01-05 DIAGNOSIS — I4891 Unspecified atrial fibrillation: Secondary | ICD-10-CM | POA: Diagnosis not present

## 2012-01-07 DIAGNOSIS — I4891 Unspecified atrial fibrillation: Secondary | ICD-10-CM | POA: Diagnosis not present

## 2012-01-07 DIAGNOSIS — Z7901 Long term (current) use of anticoagulants: Secondary | ICD-10-CM | POA: Diagnosis not present

## 2012-01-07 DIAGNOSIS — R5381 Other malaise: Secondary | ICD-10-CM | POA: Diagnosis not present

## 2012-01-07 DIAGNOSIS — I10 Essential (primary) hypertension: Secondary | ICD-10-CM | POA: Diagnosis not present

## 2012-01-11 DIAGNOSIS — R5381 Other malaise: Secondary | ICD-10-CM | POA: Diagnosis not present

## 2012-01-11 DIAGNOSIS — M79609 Pain in unspecified limb: Secondary | ICD-10-CM | POA: Diagnosis not present

## 2012-01-11 DIAGNOSIS — I4891 Unspecified atrial fibrillation: Secondary | ICD-10-CM | POA: Diagnosis not present

## 2012-01-11 DIAGNOSIS — F411 Generalized anxiety disorder: Secondary | ICD-10-CM | POA: Diagnosis not present

## 2012-01-11 DIAGNOSIS — I10 Essential (primary) hypertension: Secondary | ICD-10-CM | POA: Diagnosis not present

## 2012-01-11 DIAGNOSIS — K219 Gastro-esophageal reflux disease without esophagitis: Secondary | ICD-10-CM | POA: Diagnosis not present

## 2012-01-11 DIAGNOSIS — E782 Mixed hyperlipidemia: Secondary | ICD-10-CM | POA: Diagnosis not present

## 2012-01-11 DIAGNOSIS — R609 Edema, unspecified: Secondary | ICD-10-CM | POA: Diagnosis not present

## 2012-01-26 DIAGNOSIS — M2010 Hallux valgus (acquired), unspecified foot: Secondary | ICD-10-CM | POA: Diagnosis not present

## 2012-01-26 DIAGNOSIS — M79609 Pain in unspecified limb: Secondary | ICD-10-CM | POA: Diagnosis not present

## 2012-01-26 DIAGNOSIS — M76829 Posterior tibial tendinitis, unspecified leg: Secondary | ICD-10-CM | POA: Diagnosis not present

## 2012-01-26 DIAGNOSIS — M7150 Other bursitis, not elsewhere classified, unspecified site: Secondary | ICD-10-CM | POA: Diagnosis not present

## 2012-01-26 DIAGNOSIS — D2370 Other benign neoplasm of skin of unspecified lower limb, including hip: Secondary | ICD-10-CM | POA: Diagnosis not present

## 2012-01-26 DIAGNOSIS — M659 Synovitis and tenosynovitis, unspecified: Secondary | ICD-10-CM | POA: Diagnosis not present

## 2012-02-01 DIAGNOSIS — Z7901 Long term (current) use of anticoagulants: Secondary | ICD-10-CM | POA: Diagnosis not present

## 2012-02-01 DIAGNOSIS — I4891 Unspecified atrial fibrillation: Secondary | ICD-10-CM | POA: Diagnosis not present

## 2012-02-03 DIAGNOSIS — C44319 Basal cell carcinoma of skin of other parts of face: Secondary | ICD-10-CM | POA: Diagnosis not present

## 2012-02-09 DIAGNOSIS — M7750 Other enthesopathy of unspecified foot: Secondary | ICD-10-CM | POA: Diagnosis not present

## 2012-02-09 DIAGNOSIS — D2370 Other benign neoplasm of skin of unspecified lower limb, including hip: Secondary | ICD-10-CM | POA: Diagnosis not present

## 2012-03-09 DIAGNOSIS — I4891 Unspecified atrial fibrillation: Secondary | ICD-10-CM | POA: Diagnosis not present

## 2012-03-09 DIAGNOSIS — Z7901 Long term (current) use of anticoagulants: Secondary | ICD-10-CM | POA: Diagnosis not present

## 2012-03-14 DIAGNOSIS — M2040 Other hammer toe(s) (acquired), unspecified foot: Secondary | ICD-10-CM | POA: Diagnosis not present

## 2012-03-14 DIAGNOSIS — M2010 Hallux valgus (acquired), unspecified foot: Secondary | ICD-10-CM | POA: Diagnosis not present

## 2012-03-14 DIAGNOSIS — M6240 Contracture of muscle, unspecified site: Secondary | ICD-10-CM | POA: Diagnosis not present

## 2012-03-14 DIAGNOSIS — M76829 Posterior tibial tendinitis, unspecified leg: Secondary | ICD-10-CM | POA: Diagnosis not present

## 2012-04-25 DIAGNOSIS — I4891 Unspecified atrial fibrillation: Secondary | ICD-10-CM | POA: Diagnosis not present

## 2012-04-25 DIAGNOSIS — Z7901 Long term (current) use of anticoagulants: Secondary | ICD-10-CM | POA: Diagnosis not present

## 2012-05-06 DIAGNOSIS — I4891 Unspecified atrial fibrillation: Secondary | ICD-10-CM | POA: Diagnosis not present

## 2012-05-06 DIAGNOSIS — I10 Essential (primary) hypertension: Secondary | ICD-10-CM | POA: Diagnosis not present

## 2012-05-06 DIAGNOSIS — R42 Dizziness and giddiness: Secondary | ICD-10-CM | POA: Diagnosis not present

## 2012-05-10 DIAGNOSIS — R42 Dizziness and giddiness: Secondary | ICD-10-CM | POA: Diagnosis not present

## 2012-05-11 DIAGNOSIS — R42 Dizziness and giddiness: Secondary | ICD-10-CM | POA: Diagnosis not present

## 2012-05-22 ENCOUNTER — Encounter (HOSPITAL_COMMUNITY): Payer: Self-pay | Admitting: Emergency Medicine

## 2012-05-22 ENCOUNTER — Inpatient Hospital Stay (HOSPITAL_COMMUNITY)
Admission: EM | Admit: 2012-05-22 | Discharge: 2012-05-23 | DRG: 310 | Disposition: A | Discharge status: Home / Self Care | Payer: Medicare Other | Attending: Cardiology | Admitting: Cardiology

## 2012-05-22 DIAGNOSIS — I4891 Unspecified atrial fibrillation: Secondary | ICD-10-CM | POA: Diagnosis not present

## 2012-05-22 DIAGNOSIS — R5383 Other fatigue: Secondary | ICD-10-CM | POA: Diagnosis not present

## 2012-05-22 DIAGNOSIS — Z7901 Long term (current) use of anticoagulants: Secondary | ICD-10-CM | POA: Diagnosis not present

## 2012-05-22 DIAGNOSIS — E785 Hyperlipidemia, unspecified: Secondary | ICD-10-CM | POA: Diagnosis not present

## 2012-05-22 DIAGNOSIS — N183 Chronic kidney disease, stage 3 unspecified: Secondary | ICD-10-CM | POA: Diagnosis present

## 2012-05-22 DIAGNOSIS — I498 Other specified cardiac arrhythmias: Secondary | ICD-10-CM | POA: Diagnosis not present

## 2012-05-22 DIAGNOSIS — R5381 Other malaise: Secondary | ICD-10-CM | POA: Diagnosis not present

## 2012-05-22 DIAGNOSIS — I4819 Other persistent atrial fibrillation: Secondary | ICD-10-CM | POA: Diagnosis present

## 2012-05-22 DIAGNOSIS — I10 Essential (primary) hypertension: Secondary | ICD-10-CM | POA: Diagnosis present

## 2012-05-22 DIAGNOSIS — R001 Bradycardia, unspecified: Secondary | ICD-10-CM

## 2012-05-22 DIAGNOSIS — I129 Hypertensive chronic kidney disease with stage 1 through stage 4 chronic kidney disease, or unspecified chronic kidney disease: Secondary | ICD-10-CM | POA: Diagnosis present

## 2012-05-22 DIAGNOSIS — R42 Dizziness and giddiness: Secondary | ICD-10-CM | POA: Diagnosis not present

## 2012-05-22 HISTORY — DX: Essential (primary) hypertension: I10

## 2012-05-22 HISTORY — DX: Pure hypercholesterolemia, unspecified: E78.00

## 2012-05-22 LAB — BASIC METABOLIC PANEL
BUN: 24 mg/dL — ABNORMAL HIGH (ref 6–23)
Creatinine, Ser: 1.19 mg/dL — ABNORMAL HIGH (ref 0.50–1.10)
GFR calc non Af Amer: 43 mL/min — ABNORMAL LOW (ref >90)
Potassium: 4.6 mEq/L (ref 3.5–5.1)

## 2012-05-22 LAB — CBC
Hemoglobin: 13.3 g/dL (ref 12.0–15.0)
MCH: 33.1 pg (ref 26.0–34.0)
MCHC: 33 g/dL (ref 30.0–36.0)
RBC: 4.02 MIL/uL (ref 3.87–5.11)
RDW: 14.2 % (ref 11.5–15.5)
WBC: 7.8 10*3/uL (ref 4.0–10.5)

## 2012-05-22 NOTE — ED Notes (Signed)
Pt presented to ED with feeling unwell,dizzy and complains of irregular heart beat.

## 2012-05-22 NOTE — ED Notes (Signed)
Pt states she has been feeling bad for over 2 weeks.  Reports intermittent episodes of dizziness and low HR.  States she saw cardiologist 2 weeks ago and wore monitor for 48 hours.  States monitoring was normal.  Denies chest pain.  On arrival, while hooking pt up to monitor, EMT reported HR in 40s.  HR now 77.

## 2012-05-22 NOTE — ED Provider Notes (Signed)
History     CSN: [id number]  Arrival date & time 05/22/12  2040   First MD Initiated Contact with Patient 05/22/12 2155      Chief Complaint  Patient presents with  . Irregular Heart Beat    The history is provided by the patient.  onset - several days ago Course - worsening Worsened by - exertion Improved - rest  Pt presents for abnormal heart rate - she reports that at home her HR is in the 40s and she feels weak/dizzy.  No cp/sob.  No syncope.  No abdominal pain.  She reports her BP was elevated.  She reports she has had issues with HR recently, currently on amiodarone for afib.  She has been on this for some time. She reports symptoms for at least 2 weeks worse in past several days.  Spoke to her cardiologist who advised ED visit (dr Gloris Manchester turner) She denies vertigo No HA No focal weakness  Past Medical History  Diagnosis Date  . Hypertension   . Atrial fibrillation   . High cholesterol     Past Surgical History  Procedure Date  . Shoulder surgery     No family history on file.  History  Substance Use Topics  . Smoking status: Never Smoker   . Smokeless tobacco: Not on file  . Alcohol Use: No    OB History    Grav Para Term Preterm Abortions TAB SAB Ect Mult Living                  Review of Systems  Constitutional: Negative for fever.  Respiratory: Negative for chest tightness.   Gastrointestinal: Negative for vomiting and abdominal distention.  Neurological: Positive for dizziness.  All other systems reviewed and are negative.    Allergies  Review of patient's allergies indicates no known allergies.  Home Medications   Current Outpatient Rx  Name Route Sig Dispense Refill  . ACETAMINOPHEN 500 MG PO TABS Oral Take 1,000 mg by mouth every 6 (six) hours as needed. For pain    . AMIODARONE HCL 100 MG PO TABS Oral Take 50 mg by mouth daily.    Marland Kitchen AMLODIPINE BESYLATE 2.5 MG PO TABS Oral Take 5 mg by mouth daily.    . FUROSEMIDE 20 MG PO TABS Oral  Take 20 mg by mouth daily.    Marland Kitchen LORAZEPAM 0.5 MG PO TABS Oral Take 0.25 mg by mouth at bedtime.    . ADULT MULTIVITAMIN W/MINERALS CH Oral Take 1 tablet by mouth daily.    Marland Kitchen OMEPRAZOLE 20 MG PO CPDR Oral Take 20 mg by mouth daily.    Marland Kitchen SIMVASTATIN 20 MG PO TABS Oral Take 20 mg by mouth every evening.    . WARFARIN SODIUM 5 MG PO TABS Oral Take 2.5-5 mg by mouth daily. Patient take 0.5 tablet for 3 days then takes 1 whole tab for 4 days      BP 166/49  Pulse 79  Temp 97.8 F (36.6 C) (Oral)  Resp 16  SpO2 97%  Physical Exam CONSTITUTIONAL: Well developed/well nourished HEAD AND FACE: Normocephalic/atraumatic EYES: EOMI/PERRL ENMT: Mucous membranes moist NECK: supple no meningeal signs SPINE:entire spine nontender CV:  no murmurs/rubs/gallops noted LUNGS: Lungs are clear to auscultation bilaterally, no apparent distress ABDOMEN: soft, nontender, no rebound or guarding GU:no cva tenderness NEURO: Pt is awake/alert, moves all extremitiesx4 EXTREMITIES: pulses normal, full ROM SKIN: warm, color normal PSYCH: no abnormalities of mood noted  ED Course  Procedures  Labs Reviewed  CBC - Abnormal; Notable for the following:    MCV 100.2 (*)     All other components within normal limits  BASIC METABOLIC PANEL - Abnormal; Notable for the following:    Glucose, Bld 114 (*)     BUN 24 (*)     Creatinine, Ser 1.19 (*)     Calcium 10.9 (*)     GFR calc non Af Amer 43 (*)     GFR calc Af Amer 50 (*)     All other components within normal limits  POCT I-STAT TROPONIN I   12:18 AM Pt with symptomatic bradycardia and has had some episodes of bradycardia (but normal BP)    D/w cardiology to see patient for likely admission    MDM  Nursing notes including past medical history and social history reviewed and considered in documentation All labs/vitals reviewed and considered        Date: 05/22/2012  Rate: 74  Rhythm: normal sinus rhythm  QRS Axis: left  Intervals: normal   ST/T Wave abnormalities: nonspecific ST changes  Conduction Disutrbances:none  Narrative Interpretation: frequent PVC noted     Joya Gaskins, MD 05/23/12 [phone]

## 2012-05-23 DIAGNOSIS — R42 Dizziness and giddiness: Secondary | ICD-10-CM

## 2012-05-23 DIAGNOSIS — R001 Bradycardia, unspecified: Secondary | ICD-10-CM

## 2012-05-23 DIAGNOSIS — I498 Other specified cardiac arrhythmias: Principal | ICD-10-CM

## 2012-05-23 DIAGNOSIS — I4891 Unspecified atrial fibrillation: Secondary | ICD-10-CM

## 2012-05-23 LAB — CARDIAC PANEL(CRET KIN+CKTOT+MB+TROPI)
Relative Index: INVALID (ref 0.0–2.5)
Relative Index: INVALID (ref 0.0–2.5)
Total CK: 64 U/L (ref 7–177)
Total CK: 74 U/L (ref 7–177)
Troponin I: <0.3 ng/mL (ref <0.30)
Troponin I: <0.3 ng/mL (ref <0.30)

## 2012-05-23 LAB — LIPID PANEL
HDL: 80 mg/dL (ref >39)
Total CHOL/HDL Ratio: 2 RATIO
Triglycerides: 86 mg/dL (ref <150)

## 2012-05-23 LAB — MAGNESIUM: Magnesium: 2.2 mg/dL (ref 1.5–2.5)

## 2012-05-23 LAB — HEMOGLOBIN A1C: Mean Plasma Glucose: 117 mg/dL — ABNORMAL HIGH (ref <117)

## 2012-05-23 MED ORDER — WARFARIN SODIUM 1 MG PO TABS
1.0000 mg | ORAL_TABLET | Freq: Once | ORAL | Status: AC
Start: 2012-05-23 — End: 2012-05-23
  Administered 2012-05-23: 1 mg via ORAL
  Filled 2012-05-23: qty 1

## 2012-05-23 MED ORDER — ACETAMINOPHEN 325 MG PO TABS: 650.0000 mg | ORAL_TABLET | Freq: Four times a day (QID) | ORAL | Status: DC | PRN

## 2012-05-23 MED ORDER — ACETAMINOPHEN 325 MG PO TABS
650.0000 mg | ORAL_TABLET | ORAL | Status: DC | PRN
  Administered 2012-05-23 (×2): 650 mg via ORAL
  Filled 2012-05-23 (×2): qty 2

## 2012-05-23 MED ORDER — WARFARIN SODIUM 2.5 MG PO TABS: 2.5000 mg | ORAL_TABLET | ORAL | Status: DC

## 2012-05-23 MED ORDER — ADULT MULTIVITAMIN W/MINERALS CH
1.0000 | ORAL_TABLET | Freq: Every day | ORAL | Status: DC
  Administered 2012-05-23: 1 via ORAL
  Filled 2012-05-23: qty 1

## 2012-05-23 MED ORDER — PANTOPRAZOLE SODIUM 40 MG PO TBEC
40.0000 mg | DELAYED_RELEASE_TABLET | Freq: Every day | ORAL | Status: DC
  Administered 2012-05-23: 40 mg via ORAL
  Filled 2012-05-23: qty 1

## 2012-05-23 MED ORDER — NITROGLYCERIN 0.4 MG SL SUBL: 0.4000 mg | SUBLINGUAL_TABLET | SUBLINGUAL | Status: DC | PRN

## 2012-05-23 MED ORDER — WARFARIN - PHARMACIST DOSING INPATIENT: Freq: Every day | Status: DC

## 2012-05-23 MED ORDER — WARFARIN - PHYSICIAN DOSING INPATIENT: Freq: Every day | Status: DC

## 2012-05-23 MED ORDER — SIMVASTATIN 20 MG PO TABS
20.0000 mg | ORAL_TABLET | Freq: Every evening | ORAL | Status: DC
  Administered 2012-05-23: 20 mg via ORAL
  Filled 2012-05-23: qty 1

## 2012-05-23 MED ORDER — ONDANSETRON HCL 4 MG/2ML IJ SOLN: 4.0000 mg | Freq: Four times a day (QID) | INTRAMUSCULAR | Status: DC | PRN

## 2012-05-23 MED ORDER — WARFARIN SODIUM 5 MG PO TABS
5.0000 mg | ORAL_TABLET | ORAL | Status: DC
  Filled 2012-05-23: qty 1

## 2012-05-23 MED ORDER — LORAZEPAM 0.5 MG PO TABS: 0.2500 mg | ORAL_TABLET | Freq: Every evening | ORAL | Status: DC | PRN

## 2012-05-23 MED ORDER — FUROSEMIDE 20 MG PO TABS
20.0000 mg | ORAL_TABLET | Freq: Every day | ORAL | Status: DC
  Administered 2012-05-23: 20 mg via ORAL
  Filled 2012-05-23: qty 1

## 2012-05-23 MED ORDER — AMLODIPINE BESYLATE 5 MG PO TABS
5.0000 mg | ORAL_TABLET | Freq: Every day | ORAL | Status: DC
  Administered 2012-05-23: 5 mg via ORAL
  Filled 2012-05-23: qty 1

## 2012-05-23 NOTE — H&P (Signed)
Molly [name] is an 78 y.o. female.   Chief Complaint: slow heart rate HPI: 78 yo woman known to Dr. Mayford [name] with PMH of HTN, atrial fibrillation on chronic anticoagulation and dyslipidemia who continues to have significant symptoms of lightheadedness with intermittent slow heart rates in the high 30s/low 40s. She tells me the symptoms have been doing on for some time (many months) but come and go. She recently saw Dr. Mayford [name] and they discussed the issues but continued the prior plan. However, she has become more symptomatic and called Dr. Norris [name] office today late and it was decided for her to come to the ER. In the ER she had HR ranging from 38-70s. She has no chest pain - just a flutter is noticed when she has slow heart rates. No overt syncope, no PND, no progressive DOE/SOB.   Past Medical History  Diagnosis Date  . Hypertension   . Atrial fibrillation   . High cholesterol     Past Surgical History  Procedure Date  . Shoulder surgery     No family history on file. Social History:  reports that she has never smoked. She does not have any smokeless tobacco history on file. She reports that she does not drink alcohol or use illicit drugs.  Allergies: No Known Allergies  Medications Prior to Admission  Medication Sig Dispense Refill  . acetaminophen (TYLENOL) 500 MG tablet Take 1,000 mg by mouth every 6 (six) hours as needed. For pain      . amiodarone (PACERONE) 100 MG tablet Take 50 mg by mouth daily.      Marland Kitchen amLODipine (NORVASC) 2.5 MG tablet Take 5 mg by mouth daily.      . furosemide (LASIX) 20 MG tablet Take 20 mg by mouth daily.      Marland Kitchen LORazepam (ATIVAN) 0.5 MG tablet Take 0.25 mg by mouth at bedtime.      . Multiple Vitamin (MULTIVITAMIN WITH MINERALS) TABS Take 1 tablet by mouth daily.      Marland Kitchen omeprazole (PRILOSEC) 20 MG capsule Take 20 mg by mouth daily.      . simvastatin (ZOCOR) 20 MG tablet Take 20 mg by mouth every evening.      . warfarin (COUMADIN) 5 MG tablet Take  2.5-5 mg by mouth daily. Patient take 0.5 tablet for 3 days then takes 1 whole tab for 4 days        Review of Systems  Constitutional: Positive for malaise/fatigue. Negative for fever, chills and weight loss.  HENT: Negative for hearing loss, neck pain and tinnitus.   Eyes: Negative for blurred vision, double vision and photophobia.  Respiratory: Negative for cough, hemoptysis and shortness of breath.   Cardiovascular: Positive for palpitations. Negative for chest pain, orthopnea, claudication and PND.  Gastrointestinal: Negative for nausea, vomiting and abdominal pain.  Genitourinary: Negative for dysuria and urgency.  Musculoskeletal: Negative for myalgias.  Skin: Negative for itching and rash.  Neurological: Positive for dizziness. Negative for tingling, tremors, focal weakness, seizures, loss of consciousness and headaches.  Endo/Heme/Allergies: Negative for environmental allergies and polydipsia.  Psychiatric/Behavioral: Negative for depression, suicidal ideas and substance abuse.    Blood pressure 158/62, pulse 79, temperature 97.8 F (36.6 C), temperature source Oral, resp. rate 20, weight 84.369 kg (186 lb), SpO2 96.00%. Physical Exam  Nursing note and vitals reviewed. Constitutional: She is oriented to person, place, and time. She appears well-developed and well-nourished. No distress.  HENT:  Head: Normocephalic and atraumatic.  Nose: Nose normal.  Mouth/Throat: No oropharyngeal exudate.  Eyes: Conjunctivae and EOM are normal. Pupils are equal, round, and reactive to light. No scleral icterus.  Neck: Normal range of motion. Neck supple. No JVD present. No tracheal deviation present. No thyromegaly present.  Cardiovascular: Normal heart sounds and intact distal pulses.  Exam reveals no gallop.   No murmur heard.      Variable rhythm - frequent PVCS - appears to be sinus; HR 70s on my exam  Respiratory: Effort normal and breath sounds normal. No respiratory distress. She has  no wheezes. She has no rales.  GI: Soft. Bowel sounds are normal. She exhibits no distension. There is no tenderness. There is no rebound.  Musculoskeletal: Normal range of motion. She exhibits no tenderness.       Trace LEE bilaterally  Neurological: She is alert and oriented to person, place, and time. No cranial nerve deficit. Coordination normal.  Skin: Skin is warm and dry. No rash noted. She is not diaphoretic. No erythema.  Psychiatric: She has a normal mood and affect. Her behavior is normal.   labs reviewed; K 4.6, Cr 1.2, na 140, troponin negative, h/h 13.3/40.3, plt 208 EKG reviewed; NSR 70s, LAD, nonspecific IVCD, PCS, 1st degree AV block '03 TTE with EF 45-50% 12/04 LHC insignificant  Problem List Symptomatic bradycardia Hypertension Nonspecific IVCD with 1st degree AV block Paroxsymal atrial fibrillation Stage II/III Chronic Kidney Disease  Assessment/Plan 78 yo woman with PMH of Hypertension, paroxysmal atrial fibrillation on warfarin, last INR 3.2 a few weeks ago and dyslipidemia here with symptomatic bradycardia. This is likely secondary to progressive conduction disease in the setting of 1st degree AV block, nonspecific IVCD and amiodarone use. For now, will hold amiodarone (but with 40 day half life will take significant period of time to washout); monitor on telemetry, based on findings patient may need amiodarone washout, possible pacemaker based on symptoms and atrial fibrillation burden. Patient and husband would like to develop a plan before departing hospital - they say she has no quality of life at this time.  - continue amlodipine 5 mg for HTN - hold amiodarone - check INR, warfarin to be continued, dose not taken tonight and will hold in case patient ends up needing a procedure; will write for tomorrow PM - tsh, hba1c, lipid panel - continue simvastatin - repeat cardiac enzymes  Molly [name] 05/23/2012, 12:51 AM

## 2012-05-23 NOTE — Progress Notes (Signed)
VASCULAR LAB PRELIMINARY  PRELIMINARY  PRELIMINARY  PRELIMINARY  Carotid duplex  completed.    Preliminary report:  Bilateral:  No evidence of hemodynamically significant internal carotid artery stenosis.   Vertebral artery flow is antegrade.      Navi Erber, RVT 05/23/2012, 10:58 AM

## 2012-05-23 NOTE — Progress Notes (Addendum)
SUBJECTIVE:  Still complains of transient dizziness especially when turning her head  OBJECTIVE:   Vitals:   Filed Vitals:   05/22/12 2300 05/22/12 2314 05/23/12 0045 05/23/12 0403  BP: 154/57 154/57 158/62 119/68  Pulse: 53 40 79 71  Temp:  98.1 F (36.7 C) 97.8 F (36.6 C) 98 F (36.7 C)  TempSrc:  Oral Oral Oral  Resp: 15 22 20 19   Height:   5\' 4"  (1.626 m)   Weight:   84.369 kg (186 lb)   SpO2: 96% 97% 96% 92%   I&O's:   Intake/Output Summary (Last 24 hours) at 05/23/12 0857 Last data filed at 05/23/12 0558  Gross per 24 hour  Intake      0 ml  Output    700 ml  Net   -700 ml   TELEMETRY: Reviewed telemetry pt in NSR:     PHYSICAL EXAM General: Well developed, well nourished, in no acute distress  Lungs:   Clear bilaterally to auscultation and percussion. Heart:   HRRR S1 S2 Pulses are 2+ & equal. Extremities:   No clubbing, cyanosis or edema.  DP +1 Neuro: Alert and oriented X 3. Psych:  Good affect, responds appropriately   LABS: Basic Metabolic Panel:  Basename 05/23/12 0140 05/22/12 2050  NA -- 140  K -- 4.6  CL -- 104  CO2 -- 24  GLUCOSE -- 114*  BUN -- 24*  CREATININE -- 1.19*  CALCIUM -- 10.9*  MG 2.2 --  PHOS -- --   Liver Function Tests: No results found for this basename: AST:2,ALT:2,ALKPHOS:2,BILITOT:2,PROT:2,ALBUMIN:2 in the last 72 hours No results found for this basename: LIPASE:2,AMYLASE:2 in the last 72 hours CBC:  Basename 05/22/12 2050  WBC 7.8  NEUTROABS --  HGB 13.3  HCT 40.3  MCV 100.2*  PLT 208   Cardiac Enzymes:  Basename 05/23/12 0140  CKTOTAL 64  CKMB 2.3  CKMBINDEX --  TROPONINI <0.30   BNP: No components found with this basename: POCBNP:3 D-Dimer: No results found for this basename: DDIMER:2 in the last 72 hours Hemoglobin A1C: No results found for this basename: HGBA1C in the last 72 hours Fasting Lipid Panel:  Basename 05/23/12 0514  CHOL 156  HDL 80  LDLCALC 59  TRIG 86  CHOLHDL 2.0  LDLDIRECT  --   Thyroid Function Tests: No results found for this basename: TSH,T4TOTAL,FREET3,T3FREE,THYROIDAB in the last 72 hours Anemia Panel: No results found for this basename: VITAMINB12,FOLATE,FERRITIN,TIBC,IRON,RETICCTPCT in the last 72 hours Coag Panel:   Lab Results  Component Value Date   INR 3.07* 05/23/2012    RADIOLOGY: No results found.    ASSESSMENT:  1.  Symptomatic bradycardia - reportedly by patient with heart rate down to 35bpm last pm on her BP cuff in setting of dizziness 2.  PAF on systemic anticoagulation 3.  Hypretension 4.  Dizziness ? Related to #1 although it also occurs some when she turns her head to the side 5.  Dyslipidemia   PLAN:   1.  EP consult for ? Need for PPM 2.  Coumadin per pharmacy 3.  Carotid and vertebral dopplers 4.  Continue to hold amiodarone  Quintella Reichert, MD  05/23/2012  8:57 AM

## 2012-05-23 NOTE — Progress Notes (Signed)
ANTICOAGULATION CONSULT NOTE - Initial Consult  Pharmacy Consult for Coumadin Indication: atrial fibrillation  No Known Allergies  Patient Measurements: Height: 5\' 4"  (162.6 cm) Weight: 186 lb (84.369 kg) IBW/kg (Calculated) : 54.7   Vital Signs: Temp: 98 F (36.7 C) (07/29 0403) Temp src: Oral (07/29 0403) BP: 119/68 mmHg (07/29 0403) Pulse Rate: 71  (07/29 0403)  Labs:  Basename 05/23/12 0140 05/22/12 2050  HGB -- 13.3  HCT -- 40.3  PLT -- 208  APTT 41* --  LABPROT 32.2* --  INR 3.07* --  HEPARINUNFRC -- --  CREATININE -- 1.19*  CKTOTAL 64 --  CKMB 2.3 --  TROPONINI <0.30 --    Estimated Creatinine Clearance: 41.6 ml/min (by C-G formula based on Cr of 1.19).   Medical History: Past Medical History  Diagnosis Date  . Hypertension   . Atrial fibrillation   . High cholesterol     Medications:  Prescriptions prior to admission  Medication Sig Dispense Refill  . acetaminophen (TYLENOL) 500 MG tablet Take 1,000 mg by mouth every 6 (six) hours as needed. For pain      . amiodarone (PACERONE) 100 MG tablet Take 50 mg by mouth daily.      Marland Kitchen amLODipine (NORVASC) 2.5 MG tablet Take 5 mg by mouth daily.      . furosemide (LASIX) 20 MG tablet Take 20 mg by mouth daily.      Marland Kitchen LORazepam (ATIVAN) 0.5 MG tablet Take 0.25 mg by mouth at bedtime.      . Multiple Vitamin (MULTIVITAMIN WITH MINERALS) TABS Take 1 tablet by mouth daily.      Marland Kitchen omeprazole (PRILOSEC) 20 MG capsule Take 20 mg by mouth daily.      . simvastatin (ZOCOR) 20 MG tablet Take 20 mg by mouth every evening.      . warfarin (COUMADIN) 5 MG tablet Take 2.5-5 mg by mouth daily. Patient take 0.5 tablet for 3 days then takes 1 whole tab for 4 days        Assessment: 78 y.o. female presents with bradycardia and lightheadedness. On coumadin PTA for afib- last dose yesterday p.m. Baseline INR slightly supratherapeutic. No bleeding noted. CBC stable.  Goal of Therapy:  INR 2-3 Monitor platelets by  anticoagulation protocol: Yes   Plan:  1. Daily INR 2. Will give smaller dose of coumadin tonight - 1mg  po  Christoper Fabian, PharmD, BCPS Clinical pharmacist, pager [phone] 05/23/2012,9:20 AM

## 2012-05-23 NOTE — Consult Note (Signed)
  Reason for Consult:symptomatic bradycardia, PVC's and PAF  Referring Physician: [name] is an 78 y.o. female.   HPI: The patient is a 78 yo woman with a h/o PAF, HTN, and symptomatic bradycardia. She was admitted yesterday when she was feeling poorly and her heart rates (pulse rate) was in the 30's. She has been observed on telemetery with NSR and PVC's, at times in a bigeminal fashion. She c/o fatigue and weakness. She has been on amiodarone almost 10 years. She has not had significant bradycardia on telemetry. She denies syncope or chest pain. She has had some bradycardia on beta blockers in the past.   PMH: Past Medical History  Diagnosis Date  . Hypertension   . Atrial fibrillation   . High cholesterol     PSHX: Past Surgical History  Procedure Date  . Shoulder surgery     FAMHX:No family history on file.  Social History:  reports that she has never smoked. She does not have any smokeless tobacco history on file. She reports that she does not drink alcohol or use illicit drugs.  Allergies: No Known Allergies  Medications: reviewed  No results found.  ROS  As stated in the HPI and negative for all other systems.  Physical Exam  Vitals:Blood pressure 126/62, pulse 60, temperature 97.2 F (36.2 C), temperature source Oral, resp. rate 18, height 5\' 4"  (1.626 m), weight 186 lb (84.369 kg), SpO2 98.00%.  Well appearing 78 yo woman, NAD HEENT: Unremarkable Neck:  No JVD, no thyromegally Lungs:  Clear with no wheezes HEART:  Regular rate rhythm, no murmurs, no rubs, no clicks Abd:  Flat, positive bowel sounds, no organomegally, no rebound, no guarding Ext:  2 plus pulses, no edema, no cyanosis, no clubbing Skin:  No rashes no nodules Neuro:  CN II through XII intact, motor grossly intact  Tele - NSR with PVC's, at times in a bigeminal fashion  Assessment/Plan: 1. Atrial fib 2. Symptomatic PVC's 3. H/o bradycardia on combination beta blockers and  amiodarone. Rec: I think her pulse rate in the 30's yesterday almost certainly due to NSR with PVC's. I doubt sinus bradycardia is playing a significant role. She appears to be symptomatic from her amiodarone and at this point, I would suggest stopping amio, using beta blockers initially as needed and consider trying Tikosyn in 2-3 months. If she developed uncontrolled tachy-brady, then PPM would be a consideration.  Molly [name], M.D.  Molly Gowda TaylorMD 05/23/2012, 5:38 PM

## 2012-05-23 NOTE — Discharge Summary (Signed)
Patient ID: Molly [name] MRN: [id number] DOB/AGE: 04-02-1933 78 y.o.  Admit date: 05/22/2012 Discharge date: 05/23/2012  Primary Discharge Diagnosis bradycardia  Secondary Discharge Diagnosis  Atrial fibrillation  Systemic anticoagulation  Hypertension  dyslipidemia    Significant Diagnostic Studies:None  Consults: Dr. Ladona Ridgel - EP  Hospital Course:This is a 78yo WF with a history of atrial fibrillation suppressed on Amiodarone, HTN, and systemic anticoagulation who presented with complaints of dizziness and fatigue.  She had heart rates in the 40's yesterda and was admitted to the hospital.  Her heart rates in the hospital have been in the 60's with NSR with PVC's.  She was seen by EP who felt that her low HR yesterday was due to PVC's.  It was recommended that her amiodarone be stopped and then followed as an outpatient for recurrent afib. On day of discharge she was ambulating in stable condition.   Discharge Exam:  WD, WN, WF in NAD HEENT:  Benign LUNGS:  CTA bilaterally COR:  RRR no M/R/G ABD:  Soft NT, NT active BS EXT:  No C/E/E  Labs:   Lab Results  Component Value Date   WBC 7.8 05/22/2012   HGB 13.3 05/22/2012   HCT 40.3 05/22/2012   MCV 100.2* 05/22/2012   PLT 208 05/22/2012    Lab 05/22/12 2050  NA 140  K 4.6  CL 104  CO2 24  BUN 24*  CREATININE 1.19*  CALCIUM 10.9*  PROT --  BILITOT --  ALKPHOS --  ALT --  AST --  GLUCOSE 114*   Lab Results  Component Value Date   CKTOTAL 74 05/23/2012   CKMB 2.3 05/23/2012   TROPONINI <0.30 05/23/2012    Lab Results  Component Value Date   CHOL 156 05/23/2012   Lab Results  Component Value Date   HDL 80 05/23/2012   Lab Results  Component Value Date   LDLCALC 59 05/23/2012   Lab Results  Component Value Date   TRIG 86 05/23/2012   Lab Results  Component Value Date   CHOLHDL 2.0 05/23/2012   No results found for this basename: LDLDIRECT      EKG: NSR with PVC's and nonspecific IVCD  FOLLOW UP  PLANS AND APPOINTMENTS Discharge Orders    Future Orders Please Complete By Expires   Diet - low sodium heart healthy      Increase activity slowly      Call MD for:  difficulty breathing, headache or visual disturbances      Call MD for:  persistant dizziness or light-headedness      Call MD for:  extreme fatigue        Medication List  As of 05/23/2012  7:54 PM   STOP taking these medications         amiodarone 100 MG tablet         TAKE these medications         acetaminophen 500 MG tablet   Commonly known as: TYLENOL   Take 1,000 mg by mouth every 6 (six) hours as needed. For pain      amLODipine 2.5 MG tablet   Commonly known as: NORVASC   Take 5 mg by mouth daily.      furosemide 20 MG tablet   Commonly known as: LASIX   Take 20 mg by mouth daily.      LORazepam 0.5 MG tablet   Commonly known as: ATIVAN   Take 0.25 mg by mouth at bedtime.  multivitamin with minerals Tabs   Take 1 tablet by mouth daily.      omeprazole 20 MG capsule   Commonly known as: PRILOSEC   Take 20 mg by mouth daily.      simvastatin 20 MG tablet   Commonly known as: ZOCOR   Take 20 mg by mouth every evening.      warfarin 5 MG tablet   Commonly known as: COUMADIN   Take 2.5-5 mg by mouth daily. Patient take 0.5 tablet for 3 days then takes 1 whole tab for 4 days           Follow-up Information    Follow up with Quintella Reichert, MD. Call in 1 day. (call for an appt to be seen in 1 week)    Contact information:   [address] [place] [address] [phone]          BRING ALL MEDICATIONS WITH YOU TO FOLLOW UP APPOINTMENTS  Time spent with patient to include physician time 35 minutes Signed: Quintella Reichert 05/23/2012, 7:54 PM

## 2012-05-23 NOTE — Progress Notes (Signed)
PHARMACIST - PHYSICIAN COMMUNICATION CONCERNING: Pharmacy Care Issues Regarding Warfarin Labs  RECOMMENDATION (Action Taken): A baseline and daily protime for three days has been ordered to meet the [employer] National Patient safety goal and comply with the current [facility] policy.   The Pharmacy will defer all warfarin dose order changes and follow up of lab results to the prescriber unless an additional order to initiate a "pharmacy Coumadin consult" is placed.  DESCRIPTION:  While hospitalized, to be in compliance with [employer] National Patient Safety Goals, all patients on warfarin must have a baseline and/or current protime prior to the administration of warfarin. Pharmacy has received your order for warfarin without these required laboratory assessments.   

## 2012-05-23 NOTE — Care Management Note (Unsigned)
    Page 1 of 1   05/23/2012     9:02:12 AM   CARE MANAGEMENT NOTE 05/23/2012  Patient:  Molly [name], Molly [name]   Account Number:  [id number]  Date Initiated:  05/23/2012  Documentation initiated by:  SIMMONS,Nica Friske  Subjective/Objective Assessment:   ADMITTED WITH BRADYCARDIA; LIVES AT HOME WITH HUSBAND- EUGENE; WAS IPTA;  USES [facility] ON [address] FOR RX.     Action/Plan:   DISCHARGE PLANNING DISCUSSED AT BEDSIDE.   Anticipated DC Date:  05/24/2012   Anticipated DC Plan:  HOME/SELF CARE      DC Planning Services  CM consult      Choice offered to / List presented to:             Status of service:  In process, will continue to follow Medicare Important Message given?   (If response is "NO", the following Medicare IM given date fields will be blank) Date Medicare IM given:   Date Additional Medicare IM given:    Discharge Disposition:    Per UR Regulation:  Reviewed for med. necessity/level of care/duration of stay  If discussed at Long Length of Stay Meetings, dates discussed:    Comments:  05/23/12  0901  Zeriyah Wain SIMMONS RN, BSN [phone] NCM WILL FOLLOW.

## 2012-05-30 ENCOUNTER — Encounter: Payer: Self-pay | Admitting: Internal Medicine

## 2012-05-30 DIAGNOSIS — I10 Essential (primary) hypertension: Secondary | ICD-10-CM | POA: Diagnosis not present

## 2012-05-30 DIAGNOSIS — R42 Dizziness and giddiness: Secondary | ICD-10-CM | POA: Diagnosis not present

## 2012-05-30 DIAGNOSIS — I498 Other specified cardiac arrhythmias: Secondary | ICD-10-CM | POA: Diagnosis not present

## 2012-05-30 DIAGNOSIS — I4891 Unspecified atrial fibrillation: Secondary | ICD-10-CM | POA: Diagnosis not present

## 2012-05-30 DIAGNOSIS — Z7901 Long term (current) use of anticoagulants: Secondary | ICD-10-CM | POA: Diagnosis not present

## 2012-05-30 DIAGNOSIS — I4949 Other premature depolarization: Secondary | ICD-10-CM | POA: Diagnosis not present

## 2012-06-03 ENCOUNTER — Encounter: Payer: Self-pay | Admitting: *Deleted

## 2012-06-06 ENCOUNTER — Encounter: Payer: Self-pay | Admitting: Internal Medicine

## 2012-06-06 ENCOUNTER — Ambulatory Visit (INDEPENDENT_AMBULATORY_CARE_PROVIDER_SITE_OTHER): Payer: Medicare Other | Admitting: Internal Medicine

## 2012-06-06 VITALS — BP 155/91 | HR 76 | Ht 63.0 in | Wt 182.0 lb

## 2012-06-06 DIAGNOSIS — Z79899 Other long term (current) drug therapy: Secondary | ICD-10-CM | POA: Diagnosis not present

## 2012-06-06 DIAGNOSIS — I493 Ventricular premature depolarization: Secondary | ICD-10-CM

## 2012-06-06 DIAGNOSIS — I4949 Other premature depolarization: Secondary | ICD-10-CM

## 2012-06-06 DIAGNOSIS — R5381 Other malaise: Secondary | ICD-10-CM

## 2012-06-06 DIAGNOSIS — I4891 Unspecified atrial fibrillation: Secondary | ICD-10-CM | POA: Diagnosis not present

## 2012-06-06 DIAGNOSIS — R5383 Other fatigue: Secondary | ICD-10-CM

## 2012-06-06 LAB — T3, FREE: T3, Free: 2.5 pg/mL (ref 2.3–4.2)

## 2012-06-06 LAB — SEDIMENTATION RATE: Sed Rate: 30 mm/hr — ABNORMAL HIGH (ref 0–22)

## 2012-06-06 LAB — T4, FREE: Free T4: 1.22 ng/dL (ref 0.60–1.60)

## 2012-06-06 LAB — CORTISOL: Cortisol, Plasma: 10.6 ug/dL

## 2012-06-06 NOTE — Patient Instructions (Signed)
Your physician recommends that you return for lab work in: today  Your physician recommends that you schedule a follow-up appointment in: 2-3 months with Dr Ladona Ridgel.

## 2012-06-07 ENCOUNTER — Encounter: Payer: Self-pay | Admitting: Internal Medicine

## 2012-06-07 DIAGNOSIS — I493 Ventricular premature depolarization: Secondary | ICD-10-CM | POA: Insufficient documentation

## 2012-06-07 DIAGNOSIS — R5383 Other fatigue: Secondary | ICD-10-CM | POA: Insufficient documentation

## 2012-06-07 NOTE — Assessment & Plan Note (Signed)
She has occasional symptoms at this point. She may have more as the amiodarone wears off. Will address this at followup.

## 2012-06-07 NOTE — Assessment & Plan Note (Signed)
The PVC's are playing a role but I am struck that she feels poorly all of the time, even when she is in NSR with no PVC's as she is on her visit today. I discussed various treatment options with the patient. Because I am not convinced that her symptoms are all related to the PVC's, and because she has more than one PVC morphology, I would not at this time recommend PVC ablation. She is now off of amiodarone and I would let this wash out for at least 2 more months before considering additional anti-arrhythmic drug therapy.

## 2012-06-07 NOTE — Progress Notes (Signed)
HPI Molly [name] returns today for followup. She is a pleasant 78 yo woman with a h/o atrial fibrillation, who has been on long term amiodarone therapy. She has begun to feel bad and was noted to have increased ventricular ectopy on cardiac monitor with approx. 17000 PVC's a day. She did not have sustained ventricular tachycardia on cardiac monitoring and did not have prolonged pauses. When I saw her last, I recommended stopping her amiodarone as I was concerned about side effects. She continues to feel poorly. I have reviewed her cardiac monitor which demonstrates that her PVC's have several morphologies although one ( a RBBB ) appears to be more prominent then others. On the day of her visit, she denies palpitations but complains of fatigue and weakness. She states that her energy level is down.  No Known Allergies   Current Outpatient Prescriptions  Medication Sig Dispense Refill  . acetaminophen (TYLENOL) 500 MG tablet Take 1,000 mg by mouth every 6 (six) hours as needed. For pain      . amLODipine (NORVASC) 2.5 MG tablet Take 5 mg by mouth daily.      . furosemide (LASIX) 20 MG tablet Take 20 mg by mouth daily.      Marland Kitchen LORazepam (ATIVAN) 0.5 MG tablet Take 0.25 mg by mouth at bedtime.      . Multiple Vitamin (MULTIVITAMIN WITH MINERALS) TABS Take 1 tablet by mouth daily.      Marland Kitchen omeprazole (PRILOSEC) 20 MG capsule Take 20 mg by mouth daily.      . simvastatin (ZOCOR) 20 MG tablet Take 20 mg by mouth every evening.      . warfarin (COUMADIN) 5 MG tablet Take 2.5-5 mg by mouth daily. Patient take 0.5 tablet for 3 days then takes 1 whole tab for 4 days         Past Medical History  Diagnosis Date  . Hypertension   . Atrial fibrillation   . High cholesterol   . Renal insufficiency     ROS:   All systems reviewed and negative except as noted in the HPI.   Past Surgical History  Procedure Date  . Shoulder surgery   . Cardiac catheterization 09/25/2000    EF of 50% -- with normal left  ventricular size and function  . Cesarean section   . Cesarean section   . Tonsillectomy   . Shoulder arthroscopy w/ rotator cuff repair   . Bunionectomy     Right  . Varicose vein surgery   . Foot surgery      No family history on file.   History   Social History  . Marital Status: Married    Spouse Name: N/A    Number of Children: N/A  . Years of Education: N/A   Occupational History  . Not on file.   Social History Main Topics  . Smoking status: Never Smoker   . Smokeless tobacco: Never Used  . Alcohol Use: No  . Drug Use: No  . Sexually Active: Not on file   Other Topics Concern  . Not on file   Social History Narrative  . No narrative on file     BP 155/91  Pulse 76  Ht 5\' 3"  (1.6 m)  Wt 182 lb (82.555 kg)  BMI 32.24 kg/m2  Physical Exam:  Well appearing 78 yo woman, NAD HEENT: Unremarkable Neck:  No JVD, no thyromegally Lungs:  Clear with no wheezes HEART:  Regular rate rhythm, no murmurs, no rubs, no clicks  Abd:  soft, positive bowel sounds, no organomegally, no rebound, no guarding Ext:  2 plus pulses, no edema, no cyanosis, no clubbing Skin:  No rashes no nodules Neuro:  CN II through XII intact, motor grossly intact  EKG NSR with first degree AV block and LVH with QRS widening   Assess/Plan:

## 2012-06-07 NOTE — Assessment & Plan Note (Signed)
This appears to be her predominant problem. I am unclear of the etiology. It is clear that she is feeling poorly even when she is in NSR without PVC's. I note that her TSH was elevated slightly and will plan on checking a FT3 and T4 and a cortisol and ESR. I wonder about amio induced thyroiditis. I will see her in several weeks.

## 2012-06-10 ENCOUNTER — Telehealth: Payer: Self-pay | Admitting: Internal Medicine

## 2012-06-10 DIAGNOSIS — F329 Major depressive disorder, single episode, unspecified: Secondary | ICD-10-CM | POA: Diagnosis not present

## 2012-06-10 DIAGNOSIS — R42 Dizziness and giddiness: Secondary | ICD-10-CM | POA: Diagnosis not present

## 2012-06-10 DIAGNOSIS — I4891 Unspecified atrial fibrillation: Secondary | ICD-10-CM | POA: Diagnosis not present

## 2012-06-10 NOTE — Telephone Encounter (Signed)
Please return call to patient [phone] regarding lab results

## 2012-06-10 NOTE — Telephone Encounter (Signed)
Pt given lab results 

## 2012-06-13 DIAGNOSIS — I4891 Unspecified atrial fibrillation: Secondary | ICD-10-CM | POA: Diagnosis not present

## 2012-06-13 DIAGNOSIS — Z7901 Long term (current) use of anticoagulants: Secondary | ICD-10-CM | POA: Diagnosis not present

## 2012-06-24 DIAGNOSIS — Z1331 Encounter for screening for depression: Secondary | ICD-10-CM | POA: Diagnosis not present

## 2012-06-24 DIAGNOSIS — N390 Urinary tract infection, site not specified: Secondary | ICD-10-CM | POA: Diagnosis not present

## 2012-06-28 DIAGNOSIS — M25579 Pain in unspecified ankle and joints of unspecified foot: Secondary | ICD-10-CM | POA: Diagnosis not present

## 2012-06-28 DIAGNOSIS — M205X9 Other deformities of toe(s) (acquired), unspecified foot: Secondary | ICD-10-CM | POA: Insufficient documentation

## 2012-06-28 DIAGNOSIS — M2010 Hallux valgus (acquired), unspecified foot: Secondary | ICD-10-CM | POA: Diagnosis not present

## 2012-07-18 DIAGNOSIS — I4891 Unspecified atrial fibrillation: Secondary | ICD-10-CM | POA: Diagnosis not present

## 2012-07-18 DIAGNOSIS — Z7901 Long term (current) use of anticoagulants: Secondary | ICD-10-CM | POA: Diagnosis not present

## 2012-07-27 DIAGNOSIS — Z7901 Long term (current) use of anticoagulants: Secondary | ICD-10-CM | POA: Diagnosis not present

## 2012-07-27 DIAGNOSIS — I4891 Unspecified atrial fibrillation: Secondary | ICD-10-CM | POA: Diagnosis not present

## 2012-07-27 DIAGNOSIS — I10 Essential (primary) hypertension: Secondary | ICD-10-CM | POA: Diagnosis not present

## 2012-07-27 DIAGNOSIS — I4949 Other premature depolarization: Secondary | ICD-10-CM | POA: Diagnosis not present

## 2012-08-01 DIAGNOSIS — M2040 Other hammer toe(s) (acquired), unspecified foot: Secondary | ICD-10-CM | POA: Diagnosis not present

## 2012-08-01 DIAGNOSIS — M76829 Posterior tibial tendinitis, unspecified leg: Secondary | ICD-10-CM | POA: Diagnosis not present

## 2012-08-15 DIAGNOSIS — E782 Mixed hyperlipidemia: Secondary | ICD-10-CM | POA: Diagnosis not present

## 2012-08-15 DIAGNOSIS — F411 Generalized anxiety disorder: Secondary | ICD-10-CM | POA: Diagnosis not present

## 2012-08-15 DIAGNOSIS — I10 Essential (primary) hypertension: Secondary | ICD-10-CM | POA: Diagnosis not present

## 2012-08-15 DIAGNOSIS — Z23 Encounter for immunization: Secondary | ICD-10-CM | POA: Diagnosis not present

## 2012-08-15 DIAGNOSIS — I4891 Unspecified atrial fibrillation: Secondary | ICD-10-CM | POA: Diagnosis not present

## 2012-08-15 DIAGNOSIS — K219 Gastro-esophageal reflux disease without esophagitis: Secondary | ICD-10-CM | POA: Diagnosis not present

## 2012-08-15 DIAGNOSIS — R609 Edema, unspecified: Secondary | ICD-10-CM | POA: Diagnosis not present

## 2012-08-29 DIAGNOSIS — Z7901 Long term (current) use of anticoagulants: Secondary | ICD-10-CM | POA: Diagnosis not present

## 2012-08-29 DIAGNOSIS — I4891 Unspecified atrial fibrillation: Secondary | ICD-10-CM | POA: Diagnosis not present

## 2012-09-06 DIAGNOSIS — M205X9 Other deformities of toe(s) (acquired), unspecified foot: Secondary | ICD-10-CM | POA: Diagnosis not present

## 2012-09-06 DIAGNOSIS — M2010 Hallux valgus (acquired), unspecified foot: Secondary | ICD-10-CM | POA: Diagnosis not present

## 2012-09-06 DIAGNOSIS — M2040 Other hammer toe(s) (acquired), unspecified foot: Secondary | ICD-10-CM | POA: Diagnosis not present

## 2012-09-06 DIAGNOSIS — M216X9 Other acquired deformities of unspecified foot: Secondary | ICD-10-CM | POA: Diagnosis not present

## 2012-09-26 DIAGNOSIS — I4891 Unspecified atrial fibrillation: Secondary | ICD-10-CM | POA: Diagnosis not present

## 2012-09-26 DIAGNOSIS — Z7901 Long term (current) use of anticoagulants: Secondary | ICD-10-CM | POA: Diagnosis not present

## 2012-10-05 DIAGNOSIS — M76829 Posterior tibial tendinitis, unspecified leg: Secondary | ICD-10-CM | POA: Diagnosis not present

## 2012-10-31 DIAGNOSIS — I4891 Unspecified atrial fibrillation: Secondary | ICD-10-CM | POA: Diagnosis not present

## 2012-10-31 DIAGNOSIS — Z7901 Long term (current) use of anticoagulants: Secondary | ICD-10-CM | POA: Diagnosis not present

## 2012-11-21 DIAGNOSIS — I4891 Unspecified atrial fibrillation: Secondary | ICD-10-CM | POA: Diagnosis not present

## 2012-11-21 DIAGNOSIS — Z7901 Long term (current) use of anticoagulants: Secondary | ICD-10-CM | POA: Diagnosis not present

## 2012-12-19 DIAGNOSIS — Z7901 Long term (current) use of anticoagulants: Secondary | ICD-10-CM | POA: Diagnosis not present

## 2012-12-19 DIAGNOSIS — I4891 Unspecified atrial fibrillation: Secondary | ICD-10-CM | POA: Diagnosis not present

## 2013-01-10 DIAGNOSIS — D0460 Carcinoma in situ of skin of unspecified upper limb, including shoulder: Secondary | ICD-10-CM | POA: Diagnosis not present

## 2013-01-10 DIAGNOSIS — L570 Actinic keratosis: Secondary | ICD-10-CM | POA: Diagnosis not present

## 2013-01-10 DIAGNOSIS — L821 Other seborrheic keratosis: Secondary | ICD-10-CM | POA: Diagnosis not present

## 2013-01-10 DIAGNOSIS — Z85828 Personal history of other malignant neoplasm of skin: Secondary | ICD-10-CM | POA: Diagnosis not present

## 2013-01-10 DIAGNOSIS — D485 Neoplasm of uncertain behavior of skin: Secondary | ICD-10-CM | POA: Diagnosis not present

## 2013-01-12 DIAGNOSIS — Z7901 Long term (current) use of anticoagulants: Secondary | ICD-10-CM | POA: Diagnosis not present

## 2013-01-12 DIAGNOSIS — I4891 Unspecified atrial fibrillation: Secondary | ICD-10-CM | POA: Diagnosis not present

## 2013-01-12 DIAGNOSIS — I4949 Other premature depolarization: Secondary | ICD-10-CM | POA: Diagnosis not present

## 2013-01-12 DIAGNOSIS — I10 Essential (primary) hypertension: Secondary | ICD-10-CM | POA: Diagnosis not present

## 2013-01-12 DIAGNOSIS — Z79899 Other long term (current) drug therapy: Secondary | ICD-10-CM | POA: Diagnosis not present

## 2013-02-02 DIAGNOSIS — R42 Dizziness and giddiness: Secondary | ICD-10-CM | POA: Diagnosis not present

## 2013-02-14 DIAGNOSIS — H9319 Tinnitus, unspecified ear: Secondary | ICD-10-CM | POA: Diagnosis not present

## 2013-02-14 DIAGNOSIS — H81399 Other peripheral vertigo, unspecified ear: Secondary | ICD-10-CM | POA: Diagnosis not present

## 2013-02-14 DIAGNOSIS — H903 Sensorineural hearing loss, bilateral: Secondary | ICD-10-CM | POA: Diagnosis not present

## 2013-02-21 DIAGNOSIS — H409 Unspecified glaucoma: Secondary | ICD-10-CM | POA: Diagnosis not present

## 2013-02-21 DIAGNOSIS — H401490 Capsular glaucoma with pseudoexfoliation of lens, unspecified eye, stage unspecified: Secondary | ICD-10-CM | POA: Diagnosis not present

## 2013-02-23 ENCOUNTER — Encounter: Payer: Self-pay | Admitting: Internal Medicine

## 2013-02-23 ENCOUNTER — Ambulatory Visit (INDEPENDENT_AMBULATORY_CARE_PROVIDER_SITE_OTHER): Payer: Medicare Other | Admitting: Internal Medicine

## 2013-02-23 VITALS — BP 153/81 | HR 90 | Ht 66.0 in | Wt 181.8 lb

## 2013-02-23 DIAGNOSIS — I4891 Unspecified atrial fibrillation: Secondary | ICD-10-CM | POA: Diagnosis not present

## 2013-02-23 HISTORY — PX: PACEMAKER INSERTION: SHX728

## 2013-02-23 HISTORY — PX: INSERT / REPLACE / REMOVE PACEMAKER: SUR710

## 2013-02-23 MED ORDER — AMIODARONE HCL 200 MG PO TABS: 200.0000 mg | ORAL_TABLET | Freq: Every day | ORAL | Status: DC

## 2013-02-23 NOTE — Patient Instructions (Signed)
Your physician recommends that you schedule a follow-up appointment in: 8 weeks with Dr. Taylor  

## 2013-02-23 NOTE — Assessment & Plan Note (Signed)
Today we discussed her treatment options. She is having increasingly frequent bouts of what sounds like atrial fibrillation. Review of her electrocardiogram suggests that flecainide therapy would be an advisable. Her QT interval is elevated. While initiation of dofetilide would be an option, I am concerned that her QT interval is too long for this to be implemented safely. Previously she was on amiodarone maintaining sinus rhythm. After I saw the patient today my initial plan was to restart amiodarone with the expectation that she would develop more bradycardia and required backup pacemaker support. After reflecting on her elevated sedimentation rate, and PVCs while on amiodarone, I am uncertain if this is the correct course. Unfortunately, she does not have many good options. One possibility would be a trial of Multaq. I plan to discuss these issues with her primary cardiologist and may well decide to not treat her with amiodarone.

## 2013-02-23 NOTE — Progress Notes (Signed)
HPI Molly [name] returns today for followup. She is a very pleasant 79 year old woman with a history of paroxysmal atrial fibrillation who had been well-controlled on amiodarone therapy. I saw the patient approximately 8 months ago. At that time, despite maintaining sinus rhythm, she was feeling poorly. She had frequent PVCs. In addition, laboratory evaluation demonstrated an elevated sedimentation rate. Her thyroid functions were minimally abnormal. I recommended that she stop amiodarone. Over the last several weeks, she has had increasingly frequent palpitations. She thinks that she is going back into atrial fibrillation. She has had no syncope. She cannot distinguish whether her palpitations are due to atrial fibrillation or to PVCs however she notes that the episodes last for 3-4 hours at a time strongly suggesting that this is atrial fibrillation. The patient has had a history of bradycardia on amiodarone in the past. Apparently sotalol also caused bradycardia. No Known Allergies   Current Outpatient Prescriptions  Medication Sig Dispense Refill  . acetaminophen (TYLENOL) 500 MG tablet Take 1,000 mg by mouth every 6 (six) hours as needed. For pain      . amLODipine (NORVASC) 2.5 MG tablet Take 5 mg by mouth daily.      . furosemide (LASIX) 20 MG tablet Take 20 mg by mouth daily.      Marland Kitchen LORazepam (ATIVAN) 0.5 MG tablet Take 0.25 mg by mouth at bedtime.      . Multiple Vitamin (MULTIVITAMIN WITH MINERALS) TABS Take 1 tablet by mouth daily.      Marland Kitchen omeprazole (PRILOSEC) 20 MG capsule Take 20 mg by mouth daily.      . simvastatin (ZOCOR) 20 MG tablet Take 20 mg by mouth every evening.      . warfarin (COUMADIN) 5 MG tablet Take 2.5-5 mg by mouth daily. Patient take 0.5 tablet for 3 days then takes 1 whole tab for 4 days      . amiodarone (PACERONE) 200 MG tablet Take 1 tablet (200 mg total) by mouth daily.  90 tablet  3   No current facility-administered medications for this visit.     Past  Medical History  Diagnosis Date  . Hypertension   . Atrial fibrillation   . High cholesterol   . Renal insufficiency     ROS:   All systems reviewed and negative except as noted in the HPI.   Past Surgical History  Procedure Laterality Date  . Shoulder surgery    . Cardiac catheterization  09/25/2000    EF of 50% -- with normal left ventricular size and function  . Cesarean section    . Cesarean section    . Tonsillectomy    . Shoulder arthroscopy w/ rotator cuff repair    . Bunionectomy      Right  . Varicose vein surgery    . Foot surgery       No family history on file.   History   Social History  . Marital Status: Married    Spouse Name: N/A    Number of Children: N/A  . Years of Education: N/A   Occupational History  . Not on file.   Social History Main Topics  . Smoking status: Never Smoker   . Smokeless tobacco: Never Used  . Alcohol Use: No  . Drug Use: No  . Sexually Active: Not on file   Other Topics Concern  . Not on file   Social History Narrative  . No narrative on file     BP 153/81  Pulse 90  Ht 5\' 6"  (1.676 m)  Wt 181 lb 12.8 oz (82.464 kg)  BMI 29.36 kg/m2  Physical Exam:  Well appearing elderly woman, NAD HEENT: Unremarkable Neck:  7 cmJVD, no thyromegally Lungs:  Clear with no wheezes, rales, or rhonchi. HEART:  Regular rate rhythm, no murmurs, no rubs, no clicks Abd:  soft, positive bowel sounds, no organomegally, no rebound, no guarding Ext:  2 plus pulses, no edema, no cyanosis, no clubbing Skin:  No rashes no nodules Neuro:  CN II through XII intact, motor grossly intact  EKG Normal sinus rhythm with left ventricular hypertrophy and first degree A-V block with QRS widening. QRS duration is 124 ms. Corrected QT is 444 ms.  Assess/Plan:

## 2013-02-28 ENCOUNTER — Inpatient Hospital Stay (HOSPITAL_COMMUNITY)
Admission: AD | Admit: 2013-02-28 | Discharge: 2013-03-06 | DRG: 243 | Disposition: A | Discharge status: Home / Self Care | Payer: Medicare Other | Source: Ambulatory Visit | Attending: Cardiology | Admitting: Cardiology

## 2013-02-28 ENCOUNTER — Encounter (HOSPITAL_COMMUNITY): Payer: Self-pay | Admitting: General Practice

## 2013-02-28 ENCOUNTER — Inpatient Hospital Stay (HOSPITAL_COMMUNITY): Payer: Medicare Other

## 2013-02-28 DIAGNOSIS — I442 Atrioventricular block, complete: Secondary | ICD-10-CM | POA: Diagnosis not present

## 2013-02-28 DIAGNOSIS — Z7901 Long term (current) use of anticoagulants: Secondary | ICD-10-CM | POA: Diagnosis not present

## 2013-02-28 DIAGNOSIS — I498 Other specified cardiac arrhythmias: Secondary | ICD-10-CM | POA: Diagnosis not present

## 2013-02-28 DIAGNOSIS — R5383 Other fatigue: Secondary | ICD-10-CM

## 2013-02-28 DIAGNOSIS — I4891 Unspecified atrial fibrillation: Secondary | ICD-10-CM | POA: Diagnosis present

## 2013-02-28 DIAGNOSIS — I4949 Other premature depolarization: Secondary | ICD-10-CM | POA: Diagnosis present

## 2013-02-28 DIAGNOSIS — R5381 Other malaise: Secondary | ICD-10-CM | POA: Diagnosis not present

## 2013-02-28 DIAGNOSIS — I495 Sick sinus syndrome: Secondary | ICD-10-CM | POA: Diagnosis not present

## 2013-02-28 DIAGNOSIS — E785 Hyperlipidemia, unspecified: Secondary | ICD-10-CM | POA: Diagnosis present

## 2013-02-28 DIAGNOSIS — I10 Essential (primary) hypertension: Secondary | ICD-10-CM | POA: Diagnosis not present

## 2013-02-28 DIAGNOSIS — Z79899 Other long term (current) drug therapy: Secondary | ICD-10-CM | POA: Diagnosis not present

## 2013-02-28 DIAGNOSIS — E213 Hyperparathyroidism, unspecified: Secondary | ICD-10-CM | POA: Diagnosis present

## 2013-02-28 DIAGNOSIS — E7800 Pure hypercholesterolemia, unspecified: Secondary | ICD-10-CM | POA: Diagnosis present

## 2013-02-28 DIAGNOSIS — I493 Ventricular premature depolarization: Secondary | ICD-10-CM

## 2013-02-28 DIAGNOSIS — K219 Gastro-esophageal reflux disease without esophagitis: Secondary | ICD-10-CM | POA: Diagnosis present

## 2013-02-28 DIAGNOSIS — R001 Bradycardia, unspecified: Secondary | ICD-10-CM

## 2013-02-28 DIAGNOSIS — J9819 Other pulmonary collapse: Secondary | ICD-10-CM | POA: Diagnosis not present

## 2013-02-28 DIAGNOSIS — N289 Disorder of kidney and ureter, unspecified: Secondary | ICD-10-CM | POA: Diagnosis present

## 2013-02-28 HISTORY — DX: Shortness of breath: R06.02

## 2013-02-28 HISTORY — DX: Hyperparathyroidism, unspecified: E21.3

## 2013-02-28 HISTORY — DX: Gastro-esophageal reflux disease without esophagitis: K21.9

## 2013-02-28 LAB — PHOSPHORUS: Phosphorus: 2.8 mg/dL (ref 2.3–4.6)

## 2013-02-28 LAB — APTT: aPTT: 36 seconds (ref 24–37)

## 2013-02-28 LAB — COMPREHENSIVE METABOLIC PANEL
ALT: 11 U/L (ref 0–35)
AST: 26 U/L (ref 0–37)
CO2: 25 mEq/L (ref 19–32)
Calcium: 10.7 mg/dL — ABNORMAL HIGH (ref 8.4–10.5)
Creatinine, Ser: 1.2 mg/dL — ABNORMAL HIGH (ref 0.50–1.10)
GFR calc Af Amer: 49 mL/min — ABNORMAL LOW (ref >90)
GFR calc non Af Amer: 42 mL/min — ABNORMAL LOW (ref >90)
Glucose, Bld: 108 mg/dL — ABNORMAL HIGH (ref 70–99)
Sodium: 141 mEq/L (ref 135–145)
Total Protein: 6.6 g/dL (ref 6.0–8.3)

## 2013-02-28 LAB — TSH: TSH: 3.338 u[IU]/mL (ref 0.350–4.500)

## 2013-02-28 LAB — CBC WITH DIFFERENTIAL/PLATELET
Basophils Absolute: 0 10*3/uL (ref 0.0–0.1)
Eosinophils Relative: 2 % (ref 0–5)
Lymphocytes Relative: 26 % (ref 12–46)
Lymphs Abs: 2 10*3/uL (ref 0.7–4.0)
MCV: 96.5 fL (ref 78.0–100.0)
Neutro Abs: 4.7 10*3/uL (ref 1.7–7.7)
Platelets: 194 10*3/uL (ref 150–400)
RBC: 3.7 MIL/uL — ABNORMAL LOW (ref 3.87–5.11)
RDW: 14.2 % (ref 11.5–15.5)
WBC: 7.6 10*3/uL (ref 4.0–10.5)

## 2013-02-28 LAB — PROTIME-INR
INR: 2.58 — ABNORMAL HIGH (ref 0.00–1.49)
Prothrombin Time: 26.4 seconds — ABNORMAL HIGH (ref 11.6–15.2)

## 2013-02-28 MED ORDER — SOTALOL HCL 80 MG PO TABS
80.0000 mg | ORAL_TABLET | Freq: Two times a day (BID) | ORAL | Status: DC
  Filled 2013-02-28 (×2): qty 1

## 2013-02-28 MED ORDER — FUROSEMIDE 20 MG PO TABS
20.0000 mg | ORAL_TABLET | Freq: Every day | ORAL | Status: DC
  Administered 2013-03-01: 20 mg via ORAL
  Filled 2013-02-28: qty 1

## 2013-02-28 MED ORDER — DILTIAZEM LOAD VIA INFUSION
10.0000 mg | Freq: Once | INTRAVENOUS | Status: AC
  Administered 2013-02-28: 10 mg via INTRAVENOUS

## 2013-02-28 MED ORDER — LORAZEPAM 0.5 MG PO TABS
0.2500 mg | ORAL_TABLET | Freq: Every day | ORAL | Status: DC
  Administered 2013-02-28 – 2013-03-05 (×6): 0.25 mg via ORAL
  Filled 2013-02-28 (×6): qty 1

## 2013-02-28 MED ORDER — SODIUM CHLORIDE 0.9 % IJ SOLN: 3.0000 mL | Freq: Two times a day (BID) | INTRAMUSCULAR | Status: DC

## 2013-02-28 MED ORDER — PANTOPRAZOLE SODIUM 40 MG PO TBEC
40.0000 mg | DELAYED_RELEASE_TABLET | Freq: Every day | ORAL | Status: DC
  Administered 2013-03-01 – 2013-03-06 (×6): 40 mg via ORAL
  Filled 2013-02-28 (×6): qty 1

## 2013-02-28 MED ORDER — SODIUM CHLORIDE 0.9 % IV SOLN: 250.0000 mL | INTRAVENOUS | Status: DC | PRN

## 2013-02-28 MED ORDER — SIMVASTATIN 20 MG PO TABS
20.0000 mg | ORAL_TABLET | Freq: Every evening | ORAL | Status: DC
  Filled 2013-02-28: qty 1

## 2013-02-28 MED ORDER — DILTIAZEM HCL 100 MG IV SOLR
5.0000 mg/h | INTRAVENOUS | Status: DC
  Administered 2013-02-28: 10 mg/h via INTRAVENOUS
  Administered 2013-02-28: 5 mg/h via INTRAVENOUS
  Administered 2013-02-28: 10 mg/h via INTRAVENOUS
  Administered 2013-03-01: 5 mg/h via INTRAVENOUS
  Filled 2013-02-28 (×2): qty 100

## 2013-02-28 MED ORDER — SOTALOL HCL 80 MG PO TABS
80.0000 mg | ORAL_TABLET | Freq: Two times a day (BID) | ORAL | Status: DC
  Filled 2013-02-28: qty 1

## 2013-02-28 MED ORDER — ATORVASTATIN CALCIUM 10 MG PO TABS
10.0000 mg | ORAL_TABLET | Freq: Every day | ORAL | Status: DC
  Administered 2013-02-28 – 2013-03-05 (×6): 10 mg via ORAL
  Filled 2013-02-28 (×9): qty 1

## 2013-02-28 MED ORDER — SODIUM CHLORIDE 0.9 % IJ SOLN
3.0000 mL | Freq: Two times a day (BID) | INTRAMUSCULAR | Status: DC
  Administered 2013-03-01: 3 mL via INTRAVENOUS
  Administered 2013-03-02: 10 mL via INTRAVENOUS
  Administered 2013-03-03 – 2013-03-05 (×6): 3 mL via INTRAVENOUS

## 2013-02-28 MED ORDER — SODIUM CHLORIDE 0.9 % IJ SOLN: 3.0000 mL | INTRAMUSCULAR | Status: DC | PRN

## 2013-02-28 NOTE — H&P (Addendum)
Admit date: 02/28/2013 Primary Cardiologist Dr. Mayford Knife Chief complaint/reason for admission:drug loading for afib  ZOX:WRUE is a 79 yo woman  with PMH of HTN, atrial fibrillation on chronic anticoagulation and dyslipidemia who continues to have significant symptoms of lightheadedness with intermittent slow heart rates in the high 30s/low 40s and then episodes of afib with RVR that are symptomatic. She tells me the symptoms have been doing on for some time (many months) but come and go. She recently saw Dr. Ladona Ridgel.  Dr. Ladona Ridgel felt that with her history of elevated sed rate and increased PVC's that were very symptomatic while on Amio that we should avoid restarting amio.  Her QTc is prolonged some so he did not feel Tikosyn or Dofetilide were safe options.  Dr. Ladona Ridgel also felt that Flecainide would also not be a safe option due to QRS duration of .  AFter disussion with Dr. Ladona Ridgel, it was felt that the safest option would be to bring her in for loading with Sotolol 80mg  BID and titrate up to 120mg  BID while following her QTc closely.  If heart rate gets too slow then she will likely need PPM.  Today on admission she in atrial fibrillation with RVR at 170bpm.      PMH:    Past Medical History  Diagnosis Date  . Hypertension   . Atrial fibrillation   . High cholesterol    Symptomatic PVC's    Tachybrady syndrome   . Renal insufficiency     PSH:    Past Surgical History  Procedure Laterality Date  . Shoulder surgery    . Cardiac catheterization  09/25/2000    EF of 50% -- with normal left ventricular size and function  . Cesarean section    . Cesarean section    . Tonsillectomy    . Shoulder arthroscopy w/ rotator cuff repair    . Bunionectomy      Right  . Varicose vein surgery    . Foot surgery      ALLERGIES:   Review of patient's allergies indicates no known allergies.  Prior to Admit Meds:   Prescriptions prior to admission  Medication Sig Dispense Refill  . acetaminophen  (TYLENOL) 500 MG tablet Take 1,000 mg by mouth every 6 (six) hours as needed. For pain      . amiodarone (PACERONE) 200 MG tablet Take 1 tablet (200 mg total) by mouth daily.  90 tablet  3  . amLODipine (NORVASC) 2.5 MG tablet Take 5 mg by mouth daily.      . furosemide (LASIX) 20 MG tablet Take 20 mg by mouth daily.      Marland Kitchen LORazepam (ATIVAN) 0.5 MG tablet Take 0.25 mg by mouth at bedtime.      . Multiple Vitamin (MULTIVITAMIN WITH MINERALS) TABS Take 1 tablet by mouth daily.      Marland Kitchen omeprazole (PRILOSEC) 20 MG capsule Take 20 mg by mouth daily.      . simvastatin (ZOCOR) 20 MG tablet Take 20 mg by mouth every evening.      . warfarin (COUMADIN) 5 MG tablet Take 2.5-5 mg by mouth daily. Patient take 0.5 tablet for 3 days then takes 1 whole tab for 4 days       Family HX:   No family history on file. Social HX:    History   Social History  . Marital Status: Married    Spouse Name: N/A    Number of Children: N/A  . Years of Education:  N/A   Occupational History  . Not on file.   Social History Main Topics  . Smoking status: Never Smoker   . Smokeless tobacco: Never Used  . Alcohol Use: No  . Drug Use: No  . Sexually Active: Not on file   Other Topics Concern  . Not on file   Social History Narrative  . No narrative on file     ROS:  All 11 ROS were addressed and are negative except what is stated in the HPI  PHYSICAL EXAM Filed Vitals:   02/28/13 1253  BP: 114/82  Pulse: 122  Temp:   Resp:    General: Well developed, well nourished, in no acute distress Head: Eyes PERRLA, No xanthomas.   Normal cephalic and atramatic  Lungs:   Clear bilaterally to auscultation and percussion. Heart:   Irregularly lrregular adn tachy S1 S2 Pulses are 2+ & equal.            No carotid bruit. No JVD.  No abdominal bruits. No femoral bruits. Abdomen: Bowel sounds are positive, abdomen soft and non-tender without masses Extremities:   No clubbing, cyanosis or edema.  DP +1 Neuro: Alert  and oriented X 3. Psych:  Good affect, responds appropriately   Labs:   Lab Results  Component Value Date   WBC 7.8 05/22/2012   HGB 13.3 05/22/2012   HCT 40.3 05/22/2012   MCV 100.2* 05/22/2012   PLT 208 05/22/2012   No results found for this basename: NA, K, CL, CO2, BUN, CREATININE, CALCIUM, LABALBU, PROT, BILITOT, ALKPHOS, ALT, AST, GLUCOSE,  in the last 168 hours Lab Results  Component Value Date   CKTOTAL 74 05/23/2012   CKMB 2.3 05/23/2012   TROPONINI <0.30 05/23/2012   No results found for this basename: PTT   Lab Results  Component Value Date   INR 3.07* 05/23/2012     Lab Results  Component Value Date   CHOL 156 05/23/2012   Lab Results  Component Value Date   HDL 80 05/23/2012   Lab Results  Component Value Date   LDLCALC 59 05/23/2012   Lab Results  Component Value Date   TRIG 86 05/23/2012   Lab Results  Component Value Date   CHOLHDL 2.0 05/23/2012   No results found for this basename: LDLDIRECT      Radiology:  pending  EKG:  Atrial fibrillation with RVR  ASSESSMENT:  1.  Afib with RVR with HR 170bpm on admission 2.  Tachybrady syndrome 3.  HTN 4.  Symptomatic PVC's 5.  Systemic anticoagulation  PLAN:   1.  Admit to tele bed 2.  IV Cardizem 10mg  bolus then gtt at 5mg /hr 3.  Start Sotolol 80mg  BID and if QTc remains stable will increase to 120mg  BID 4.  Continue home meds except hold amlodipine- patient has stopped amio 5.  Daily EKG for QTc  Quintella Reichert, MD  02/28/2013  1:44 PM

## 2013-02-28 NOTE — Plan of Care (Signed)
Problem: Phase I Progression Outcomes Goal: Anticoagulation Therapy per MD order Outcome: Completed/Met Date Met:  02/28/13 Pt on coumadin per MD Goal: Heart rate or rhythm control medication Outcome: Progressing Pt currently on cardizem drip, HR from 90-110 at rest, HR up to 160 non-sustained with ambulation.

## 2013-03-01 DIAGNOSIS — N289 Disorder of kidney and ureter, unspecified: Secondary | ICD-10-CM | POA: Diagnosis not present

## 2013-03-01 DIAGNOSIS — I442 Atrioventricular block, complete: Secondary | ICD-10-CM | POA: Diagnosis not present

## 2013-03-01 DIAGNOSIS — I4891 Unspecified atrial fibrillation: Secondary | ICD-10-CM | POA: Diagnosis not present

## 2013-03-01 DIAGNOSIS — I495 Sick sinus syndrome: Secondary | ICD-10-CM | POA: Diagnosis not present

## 2013-03-01 DIAGNOSIS — I498 Other specified cardiac arrhythmias: Secondary | ICD-10-CM | POA: Diagnosis not present

## 2013-03-01 LAB — BASIC METABOLIC PANEL
CO2: 24 mEq/L (ref 19–32)
Calcium: 10.1 mg/dL (ref 8.4–10.5)
Creatinine, Ser: 1.05 mg/dL (ref 0.50–1.10)
GFR calc Af Amer: 58 mL/min — ABNORMAL LOW (ref >90)
GFR calc non Af Amer: 50 mL/min — ABNORMAL LOW (ref >90)
Sodium: 140 mEq/L (ref 135–145)

## 2013-03-01 LAB — MRSA PCR SCREENING: MRSA by PCR: NEGATIVE

## 2013-03-01 LAB — PROTIME-INR: Prothrombin Time: 25.9 seconds — ABNORMAL HIGH (ref 11.6–15.2)

## 2013-03-01 MED ORDER — ATROPINE SULFATE 1 MG/ML IJ SOLN
INTRAMUSCULAR | Status: AC
  Administered 2013-03-01: 15:00:00
  Filled 2013-03-01: qty 1

## 2013-03-01 MED ORDER — SOTALOL HCL 80 MG PO TABS
80.0000 mg | ORAL_TABLET | Freq: Two times a day (BID) | ORAL | Status: DC
  Administered 2013-03-01: 80 mg via ORAL
  Filled 2013-03-01 (×2): qty 1

## 2013-03-01 MED ORDER — ACETAMINOPHEN 500 MG PO TABS
1000.0000 mg | ORAL_TABLET | Freq: Four times a day (QID) | ORAL | Status: DC | PRN
  Administered 2013-03-01 – 2013-03-04 (×3): 1000 mg via ORAL
  Filled 2013-03-01 (×3): qty 2

## 2013-03-01 MED ORDER — ONDANSETRON HCL 4 MG PO TABS: 4.0000 mg | ORAL_TABLET | Freq: Three times a day (TID) | ORAL | Status: DC | PRN

## 2013-03-01 NOTE — Progress Notes (Addendum)
Will transfer to ICU for closer monitoring.  She is having multiple pauses post sotalol dose this AM.  All rate slowing drugs have been stopped.  No further sotalol.  She will likely need PPM.  Discussed with Dr. Mayford Knife earlier today.  Will hold off on temporary pacer at this time.  Could use low dose Dopamine.  Hopefully, rates will improve when sotalol dose from this morning wears off.  Initially, pauses were asymptomatic and occuring infrequently.  Sotalol was given at 10 AM. Fluid bolus ordered.  Now pauses are becoming more frequent.  She began having some nausea this afternoon, which worsened with the pauses.  Now she is having lightheadedness while in bed when her HR drops.  It has beeninthe 20s at times.  No complete syncope. Atropine given with little response.  Husband is quite annoyed.  He feels that we are "playing around."  He feels that something needs to be done or they will have to go someplace else.  I explained to him that a pacemaker was likely and that she would be evaluated by EP tomorrow.  Explained that a pacer would prevent slow HR while medicines could be added that prevented fast HR.    Recheck exam a few hours after transfer to ICU reveals improvement of HR and conversion to NSR.  Prior bradycardia was while patient was in AFib.  HR 70 BPstable.  RRR S1 S2, CTA bilaterally; soft, NT; tr edema  Watch patient in ICU.  Bedrest for a few more hours, until sotalol out of her system.  No indication for temp pacer at this time.    Critical care time  35 minutes

## 2013-03-01 NOTE — Progress Notes (Signed)
SUBJECTIVE:  Doing well and HR better controlled  OBJECTIVE:   Vitals:   Filed Vitals:   02/28/13 1500 02/28/13 1941 02/28/13 2103 03/01/13 0605  BP: 109/70 125/80 123/79 108/72  Pulse: 115 125 90 89  Temp: 97.7 F (36.5 C)  98.4 F (36.9 C) 98.3 F (36.8 C)  TempSrc: Oral  Oral Oral  Resp: 14  18 18   Height:      Weight:      SpO2: 93% 93% 91% 92%   I&O's:   Intake/Output Summary (Last 24 hours) at 03/01/13 0902 Last data filed at 03/01/13 0800  Gross per 24 hour  Intake   1320 ml  Output      3 ml  Net   1317 ml   TELEMETRY: Reviewed telemetry pt in atrial fibrilation     PHYSICAL EXAM General: Well developed, well nourished, in no acute distress Head: Eyes PERRLA, No xanthomas.   Normal cephalic and atramatic  Lungs:   Clear bilaterally to auscultation and percussion. Heart:   Irregularly irregular S1 S2 Pulses are 2+ & equal. Abdomen: Bowel sounds are positive, abdomen soft and non-tender without masses Extremities:   No clubbing, cyanosis or edema.  DP +1 Neuro: Alert and oriented X 3. Psych:  Good affect, responds appropriately   LABS: Basic Metabolic Panel:  Recent Labs  69/62/95 1500 03/01/13 0500  NA 141 140  K 4.4 4.2  CL 107 107  CO2 25 24  GLUCOSE 108* 105*  BUN 23 22  CREATININE 1.20* 1.05  CALCIUM 10.7* 10.1  MG 2.1  --   PHOS 2.8  --    Liver Function Tests:  Recent Labs  02/28/13 1500  AST 26  ALT 11  ALKPHOS 55  BILITOT 0.3  PROT 6.6  ALBUMIN 3.6   No results found for this basename: LIPASE, AMYLASE,  in the last 72 hours CBC:  Recent Labs  02/28/13 1500  WBC 7.6  NEUTROABS 4.7  HGB 12.1  HCT 35.7*  MCV 96.5  PLT 194    Thyroid Function Tests:  Recent Labs  02/28/13 1500  TSH 3.338   Anemia Panel: No results found for this basename: VITAMINB12, FOLATE, FERRITIN, TIBC, IRON, RETICCTPCT,  in the last 72 hours Coag Panel:   Lab Results  Component Value Date   INR 2.51* 03/01/2013   INR 2.58* 02/28/2013   INR  3.07* 05/23/2012    RADIOLOGY: X-ray Chest Pa And Lateral   02/28/2013  *RADIOLOGY REPORT*  Clinical Data: Atrial fibrillation.  Weakness.  CHEST - 2 VIEW  Comparison: 08/31/2011.  Findings: Cardiomegaly and aortic tortuosity are stable.  There is stable anterior eventration of the right hemidiaphragm.  The lungs are clear.  There is no pleural effusion.  Degenerative changes throughout the spine are unchanged.  IMPRESSION: Stable chest.  No acute cardiopulmonary process.   Original Report Authenticated By: Carey Bullocks, M.D.    ASSESSMENT:  1. Afib with RVR with HR 170bpm on admission now rate controlled on IV Cardizem 2. Tachybrady syndrome  3. HTN  4. Symptomatic PVC's  5. Systemic anticoagulation with therapeutic INR  PLAN:  1. Prolonged QTc resolved after HR controlled so will start Sotolol 80mg  BID and if QTc remains stable will increase to 120mg  BID  2. Continue home meds except hold amlodipine- patient has stopped amio  3. Daily EKG for QTc 4. Continue IV Cardizem for now     Quintella Reichert, MD  03/01/2013  9:02 AM

## 2013-03-01 NOTE — Progress Notes (Addendum)
Called to patient room. Pt beginning to feel dizzy and nauseous. HR dropping into 30s nonsustained and bouncing back up to 60s (afib). BP 106/65. Dr Isabel Caprice paged. Will continue to monitor. Levonne Spiller, RN   Spoke with Dr. Isabel Caprice. Betapace discontinued per his order. Zofran 4mg  PO PRN nausea ordered. Pt refuses nausea medication at this time as she states, "i'm starting to feel better." HR sustaining 50s. Will continue to monitor closely. Levonne Spiller, RN

## 2013-03-01 NOTE — Progress Notes (Signed)
Pt having headache and requesting tylenol. MD notified and order given. Baron Hamper, RN 03/01/2013 3:35 AM

## 2013-03-01 NOTE — Progress Notes (Signed)
Pt symptomatic with multiple 2.3-2.8 sec pauses. HR 26. 1/2 amp atropine given. Emelda Brothers RN

## 2013-03-01 NOTE — Progress Notes (Signed)
Pt HR dipping down to low 30s with frequent 2.8-2.89 second pauses. HR sustaining 40s-50s. Pt symptomatic (dizzy, nauseous, and feels flushed). BP 105/60.  Dr Isabel Caprice notified. 250cc saline bolus ordered. Will continue to monitor patient closely. Levonne Spiller, RN

## 2013-03-01 NOTE — Progress Notes (Signed)
Pt brady down to 30s. Asymptomatic at the time.  Cardizem drip discontinued. Pt heart rate bouncing around from 40s-60s. Dr. Mayford Knife made aware. Ok to give betapace tonight as long as HR >60. Contact Dr. Isabel Caprice for questions this afternoon. Will continue to monitor patient closely. Levonne Spiller, RN

## 2013-03-02 ENCOUNTER — Encounter (HOSPITAL_COMMUNITY)
Admission: AD | Disposition: A | Discharge status: Home / Self Care | Payer: Self-pay | Source: Ambulatory Visit | Attending: Cardiology

## 2013-03-02 DIAGNOSIS — N289 Disorder of kidney and ureter, unspecified: Secondary | ICD-10-CM | POA: Diagnosis not present

## 2013-03-02 DIAGNOSIS — I4891 Unspecified atrial fibrillation: Secondary | ICD-10-CM | POA: Diagnosis not present

## 2013-03-02 DIAGNOSIS — I495 Sick sinus syndrome: Secondary | ICD-10-CM

## 2013-03-02 DIAGNOSIS — I442 Atrioventricular block, complete: Secondary | ICD-10-CM | POA: Diagnosis not present

## 2013-03-02 HISTORY — PX: PERMANENT PACEMAKER INSERTION: SHX5480

## 2013-03-02 LAB — PROTIME-INR: Prothrombin Time: 23.3 seconds — ABNORMAL HIGH (ref 11.6–15.2)

## 2013-03-02 SURGERY — PERMANENT PACEMAKER INSERTION
Anesthesia: LOCAL

## 2013-03-02 MED ORDER — SOTALOL HCL 80 MG PO TABS
80.0000 mg | ORAL_TABLET | Freq: Two times a day (BID) | ORAL | Status: DC
  Administered 2013-03-02: 80 mg via ORAL
  Filled 2013-03-02 (×3): qty 1

## 2013-03-02 MED ORDER — SODIUM CHLORIDE 0.9 % IR SOLN
80.0000 mg | Status: DC
  Filled 2013-03-02: qty 2

## 2013-03-02 MED ORDER — MIDAZOLAM HCL 5 MG/5ML IJ SOLN
INTRAMUSCULAR | Status: AC
  Filled 2013-03-02: qty 5

## 2013-03-02 MED ORDER — ACETAMINOPHEN 325 MG PO TABS
325.0000 mg | ORAL_TABLET | ORAL | Status: DC | PRN
  Administered 2013-03-03: 325 mg via ORAL
  Filled 2013-03-02: qty 2

## 2013-03-02 MED ORDER — ONDANSETRON HCL 4 MG/2ML IJ SOLN: 4.0000 mg | Freq: Four times a day (QID) | INTRAMUSCULAR | Status: DC | PRN

## 2013-03-02 MED ORDER — LIDOCAINE HCL (PF) 1 % IJ SOLN
INTRAMUSCULAR | Status: AC
  Filled 2013-03-02: qty 60

## 2013-03-02 MED ORDER — SODIUM CHLORIDE 0.9 % IV SOLN
INTRAVENOUS | Status: DC
  Administered 2013-03-02: 10:00:00 via INTRAVENOUS

## 2013-03-02 MED ORDER — FENTANYL CITRATE 0.05 MG/ML IJ SOLN
INTRAMUSCULAR | Status: AC
  Filled 2013-03-02: qty 2

## 2013-03-02 MED ORDER — HEPARIN (PORCINE) IN NACL 2-0.9 UNIT/ML-% IJ SOLN
INTRAMUSCULAR | Status: AC
  Filled 2013-03-02: qty 500

## 2013-03-02 MED ORDER — CHLORHEXIDINE GLUCONATE 4 % EX LIQD: 60.0000 mL | Freq: Once | CUTANEOUS | Status: DC

## 2013-03-02 MED ORDER — CEFAZOLIN SODIUM-DEXTROSE 2-3 GM-% IV SOLR
2.0000 g | Freq: Four times a day (QID) | INTRAVENOUS | Status: AC
  Administered 2013-03-02 – 2013-03-03 (×3): 2 g via INTRAVENOUS
  Filled 2013-03-02 (×3): qty 50

## 2013-03-02 MED ORDER — CEFAZOLIN SODIUM-DEXTROSE 2-3 GM-% IV SOLR
2.0000 g | INTRAVENOUS | Status: DC
  Filled 2013-03-02: qty 50

## 2013-03-02 MED ORDER — CHLORHEXIDINE GLUCONATE 4 % EX LIQD
60.0000 mL | Freq: Once | CUTANEOUS | Status: AC
  Administered 2013-03-02: 4 via TOPICAL
  Filled 2013-03-02: qty 60

## 2013-03-02 NOTE — Consult Note (Signed)
 ELECTROPHYSIOLOGY CONSULT NOTE  Patient ID: Molly [name] MRN: [id number], DOB/AGE: 01/01/1933   Admit date: 02/28/2013 Date of Consult: 03/02/2013  Primary Physician: David Swayne, MD Primary Cardiologist: Traci Turner, MD Reason for Consultation: Bradycardia  History of Present Illness Molly [name] is a 79 year old woman with PAF, HTN, dyslipidemia and renal insufficiency who has been followed by Dr. Turner for management of AF. When in AF, her rates are slow in the upper 30s to 40s with symptoms of lightheadedness. She also has intermittent RVR which causes tachypalpitations. She denies CP, SOB or frank syncope. She was recently evaluated by Dr. Larry Knipp who discussed multiple treatment options with her; however, she has few options due to baseline QT prolongation and widened QRS. She had an elevated sed rate and symptomatic PVCs while on amiodarone; therefore, Dr. Rishit Burkhalter wanted to avoid resuming amiodarone. After further discussion with Dr. Turner, it was recommended she be admitted for AAD drug loading with sotalol and close watch over her QTc. She presented 02/28/2013 for admission and was in rapid AF. She was started on IV diltiazem in addition to sotalol 80 mg BID. However, she developed bradycardia with rates down to 30 bpm and sotalol was discontinued. Overnight she converted to SR and is now maintaining SR in the 60s.    Past Medical History Past Medical History  Diagnosis Date  . Hypertension   . Atrial fibrillation   . High cholesterol   . Renal insufficiency   . Hyperparathyroidism   . Shortness of breath   . GERD (gastroesophageal reflux disease)     Past Surgical History Past Surgical History  Procedure Laterality Date  . Shoulder surgery    . Cardiac catheterization  09/25/2000    EF of 50% -- with normal left ventricular size and function  . Cesarean section    . Cesarean section    . Tonsillectomy    . Shoulder arthroscopy w/ rotator cuff repair    . Bunionectomy       Right  . Varicose vein surgery    . Foot surgery      Allergies/Intolerances No Known Allergies  Inpatient Medications . atorvastatin  10 mg Oral q1800  . LORazepam  0.25 mg Oral QHS  . pantoprazole  40 mg Oral Daily  . sodium chloride  3 mL Intravenous Q12H  . sodium chloride  3 mL Intravenous Q12H   Family History Positive for CAD   Social History Social History  . Marital Status: Married   Social History Main Topics  . Smoking status: Never Smoker   . Smokeless tobacco: Never Used  . Alcohol Use: No  . Drug Use: No   Review of Systems General: No chills, fever, night sweats or weight changes  Cardiovascular: +palpitations  No chest pain, dyspnea on exertion, edema, orthopnea, paroxysmal nocturnal dyspnea Dermatological: No rash, lesions or masses Respiratory: No cough, dyspnea Urologic: No hematuria, dysuria Abdominal: No nausea, vomiting, diarrhea, bright red blood per rectum, melena, or hematemesis Neurologic: +lightheadedness  No visual changes All other systems reviewed and are otherwise negative except as noted above.  Physical Exam Blood pressure 132/68, pulse 61, temperature 97.7 F (36.5 C), temperature source Oral, resp. rate 17, height 5' 6" (1.676 m), weight 183 lb (83.008 kg), SpO2 93.00%.  General: Well developed, well appearing 79 year old female in no acute distress. HEENT: Normocephalic, atraumatic. EOMs intact. Sclera nonicteric. Oropharynx clear.  Neck: Supple without bruits. No JVD. Lungs: Respirations regular and unlabored, CTA bilaterally. No   wheezes, rales or rhonchi. Heart: RRR. S1, S2 present. No murmurs, rub, S3 or S4. Abdomen: Soft, non-tender, non-distended. BS present x 4 quadrants. No hepatosplenomegaly.  Extremities: No clubbing, cyanosis or edema. DP/PT/Radials 2+ and equal bilaterally. Psych: Normal affect. Neuro: Alert and oriented X 3. Moves all extremities spontaneously. Musculoskeletal: No kyphosis. Skin: Intact. Warm and  dry. No rashes or petechiae in exposed areas.   Labs Lab Results  Component Value Date   WBC 7.6 02/28/2013   HGB 12.1 02/28/2013   HCT 35.7* 02/28/2013   MCV 96.5 02/28/2013   PLT 194 02/28/2013    Recent Labs Lab 02/28/13 1500 03/01/13 0500  NA 141 140  K 4.4 4.2  CL 107 107  CO2 25 24  BUN 23 22  CREATININE 1.20* 1.05  CALCIUM 10.7* 10.1  PROT 6.6  --   BILITOT 0.3  --   ALKPHOS 55  --   ALT 11  --   AST 26  --   GLUCOSE 108* 105*    Recent Labs  02/28/13 1500  TSH 3.338    Recent Labs  03/01/13 0500  INR 2.51*    Radiology/Studies X-ray Chest Pa And Lateral   02/28/2013  *RADIOLOGY REPORT*  Clinical Data: Atrial fibrillation.  Weakness.  CHEST - 2 VIEW  Comparison: 08/31/2011.  Findings: Cardiomegaly and aortic tortuosity are stable.  There is stable anterior eventration of the right hemidiaphragm.  The lungs are clear.  There is no pleural effusion.  Degenerative changes throughout the spine are unchanged.  IMPRESSION: Stable chest.  No acute cardiopulmonary process.   Original Report Authenticated By: William Veazey, M.D.     12-lead ECG this AM shows SR with 1st degree AV block, widened QRS, prolonged QT interval Telemetry currently SR at 63 bpm; reviewed from last 24 hours - AF with SVR, pauses ~3 seconds   Assessment and Plan 1. Tachy-brady syndrome  2. PAF  Molly [name] presents with tachy-brady syndrome and meets criteria for PPM implantation. This will allow for optimization of her medical therapy for AF with rate/rhythm controlling medications. Risks, benefits and alternatives to PPM implantation were discussed in detail with Molly [name] today. These risks include, but are not limited to, bleeding, infection, pneumothorax, perforation, tamponade, vascular damage, renal failure, lead dislodgement, MI, stroke and death. She expressed verbal understanding and agrees to proceed.    Dr. Cassandre Oleksy to see Signed, EDMISTEN, BROOKE, PA-C 03/02/2013, 7:21 AM EP  Attending  Patient seen and examined. She is well known to me with symptomatic tachybrady syndrome, atrial fibrillation with a rapid ventricular response and symptomatic sinus node dysfunction. She is not a candidate for amio or Tikosyn. PPM and sotalol will be only way to keep her in NSR at this point. I have discussed the risks/benefits/goals/expectations of PPM with the patient and her husband and they wish to proceed.  Camari Wisham,M.D.  

## 2013-03-02 NOTE — Op Note (Signed)
DDD PPM insertion via the left subclavian vein without immediate complication. W#[id number].

## 2013-03-02 NOTE — Progress Notes (Signed)
SUBJECTIVE:  Events of yesterday noted.  Had significant bradycardia with sotolol dose.  Now on for PPM today  OBJECTIVE:   Vitals:   Filed Vitals:   03/02/13 0300 03/02/13 0400 03/02/13 0500 03/02/13 0600  BP: 126/79 111/52 117/56 132/68  Pulse:      Temp:  97.7 F (36.5 C)    TempSrc:  Oral    Resp: 18 16 15 17   Height:      Weight:      SpO2:  93%     I&O's:   Intake/Output Summary (Last 24 hours) at 03/02/13 0748 Last data filed at 03/02/13 0700  Gross per 24 hour  Intake    840 ml  Output      2 ml  Net    838 ml   TELEMETRY: Reviewed telemetry pt in NSR:     PHYSICAL EXAM General: Well developed, well nourished, in no acute distress Head: Eyes PERRLA, No xanthomas.   Normal cephalic and atramatic  Lungs:   Clear bilaterally to auscultation and percussion. Heart:   HRRR S1 S2 Pulses are 2+ & equal. Abdomen: Bowel sounds are positive, abdomen soft and non-tender without masses Extremities:   No clubbing, cyanosis or edema.  DP +1 Neuro: Alert and oriented X 3. Psych:  Good affect, responds appropriately   LABS: Basic Metabolic Panel:  Recent Labs  91/47/82 1500 03/01/13 0500  NA 141 140  K 4.4 4.2  CL 107 107  CO2 25 24  GLUCOSE 108* 105*  BUN 23 22  CREATININE 1.20* 1.05  CALCIUM 10.7* 10.1  MG 2.1  --   PHOS 2.8  --    Liver Function Tests:  Recent Labs  02/28/13 1500  AST 26  ALT 11  ALKPHOS 55  BILITOT 0.3  PROT 6.6  ALBUMIN 3.6   No results found for this basename: LIPASE, AMYLASE,  in the last 72 hours CBC:  Recent Labs  02/28/13 1500  WBC 7.6  NEUTROABS 4.7  HGB 12.1  HCT 35.7*  MCV 96.5  PLT 194  Thyroid Function Tests:  Recent Labs  02/28/13 1500  TSH 3.338   Anemia Panel: No results found for this basename: VITAMINB12, FOLATE, FERRITIN, TIBC, IRON, RETICCTPCT,  in the last 72 hours Coag Panel:   Lab Results  Component Value Date   INR 2.51* 03/01/2013   INR 2.58* 02/28/2013   INR 3.07* 05/23/2012    RADIOLOGY:  X-ray Chest Pa And Lateral   02/28/2013  *RADIOLOGY REPORT*  Clinical Data: Atrial fibrillation.  Weakness.  CHEST - 2 VIEW  Comparison: 08/31/2011.  Findings: Cardiomegaly and aortic tortuosity are stable.  There is stable anterior eventration of the right hemidiaphragm.  The lungs are clear.  There is no pleural effusion.  Degenerative changes throughout the spine are unchanged.  IMPRESSION: Stable chest.  No acute cardiopulmonary process.   Original Report Authenticated By: Carey Bullocks, M.D.    ASSESSMENT:  1. Afib with RVR with HR 170bpm on admission now in NSR 2. Tachybrady syndrome with profound bradycardia after first dose of sotolol 3. HTN  4. Symptomatic PVC's  5. Systemic anticoagulation with therapeutic INR  PLAN:  1. PPM today 2. Restart sotolol after PPM     Quintella Reichert, MD  03/02/2013  7:48 AM

## 2013-03-02 NOTE — H&P (View-Only) (Signed)
ELECTROPHYSIOLOGY CONSULT NOTE  Patient ID: Molly [name] MRN: [id number], DOB/AGE: [birth year]   Admit date: 02/28/2013 Date of Consult: 03/02/2013  Primary Physician: Tally Joe, MD Primary Cardiologist: Armanda Magic, MD Reason for Consultation: Bradycardia  History of Present Illness Molly [name] is a 79 year old woman with PAF, HTN, dyslipidemia and renal insufficiency who has been followed by Molly [name] for management of AF. When in AF, her rates are slow in the upper 30s to 40s with symptoms of lightheadedness. She also has intermittent RVR which causes tachypalpitations. She denies CP, SOB or frank syncope. She was recently evaluated by Molly [name] who discussed multiple treatment options with her; however, she has few options due to baseline QT prolongation and widened QRS. She had an elevated sed rate and symptomatic PVCs while on amiodarone; therefore, Molly [name] wanted to avoid resuming amiodarone. After further discussion with Molly [name], it was recommended she be admitted for AAD drug loading with sotalol and close watch over her QTc. She presented 02/28/2013 for admission and was in rapid AF. She was started on IV diltiazem in addition to sotalol 80 mg BID. However, she developed bradycardia with rates down to 30 bpm and sotalol was discontinued. Overnight she converted to SR and is now maintaining SR in the 60s.    Past Medical History Past Medical History  Diagnosis Date  . Hypertension   . Atrial fibrillation   . High cholesterol   . Renal insufficiency   . Hyperparathyroidism   . Shortness of breath   . GERD (gastroesophageal reflux disease)     Past Surgical History Past Surgical History  Procedure Laterality Date  . Shoulder surgery    . Cardiac catheterization  09/25/2000    EF of 50% -- with normal left ventricular size and function  . Cesarean section    . Cesarean section    . Tonsillectomy    . Shoulder arthroscopy w/ rotator cuff repair    . Bunionectomy       Right  . Varicose vein surgery    . Foot surgery      Allergies/Intolerances No Known Allergies  Inpatient Medications . atorvastatin  10 mg Oral q1800  . LORazepam  0.25 mg Oral QHS  . pantoprazole  40 mg Oral Daily  . sodium chloride  3 mL Intravenous Q12H  . sodium chloride  3 mL Intravenous Q12H   Family History Positive for CAD   Social History Social History  . Marital Status: Married   Social History Main Topics  . Smoking status: Never Smoker   . Smokeless tobacco: Never Used  . Alcohol Use: No  . Drug Use: No   Review of Systems General: No chills, fever, night sweats or weight changes  Cardiovascular: +palpitations  No chest pain, dyspnea on exertion, edema, orthopnea, paroxysmal nocturnal dyspnea Dermatological: No rash, lesions or masses Respiratory: No cough, dyspnea Urologic: No hematuria, dysuria Abdominal: No nausea, vomiting, diarrhea, bright red blood per rectum, melena, or hematemesis Neurologic: +lightheadedness  No visual changes All other systems reviewed and are otherwise negative except as noted above.  Physical Exam Blood pressure 132/68, pulse 61, temperature 97.7 F (36.5 C), temperature source Oral, resp. rate 17, height 5\' 6"  (1.676 m), weight 183 lb (83.008 kg), SpO2 93.00%.  General: Well developed, well appearing 79 year old female in no acute distress. HEENT: Normocephalic, atraumatic. EOMs intact. Sclera nonicteric. Oropharynx clear.  Neck: Supple without bruits. No JVD. Lungs: Respirations regular and unlabored, CTA bilaterally. No  wheezes, rales or rhonchi. Heart: RRR. S1, S2 present. No murmurs, rub, S3 or S4. Abdomen: Soft, non-tender, non-distended. BS present x 4 quadrants. No hepatosplenomegaly.  Extremities: No clubbing, cyanosis or edema. DP/PT/Radials 2+ and equal bilaterally. Psych: Normal affect. Neuro: Alert and oriented X 3. Moves all extremities spontaneously. Musculoskeletal: No kyphosis. Skin: Intact. Warm and  dry. No rashes or petechiae in exposed areas.   Labs Lab Results  Component Value Date   WBC 7.6 02/28/2013   HGB 12.1 02/28/2013   HCT 35.7* 02/28/2013   MCV 96.5 02/28/2013   PLT 194 02/28/2013    Recent Labs Lab 02/28/13 1500 03/01/13 0500  NA 141 140  K 4.4 4.2  CL 107 107  CO2 25 24  BUN 23 22  CREATININE 1.20* 1.05  CALCIUM 10.7* 10.1  PROT 6.6  --   BILITOT 0.3  --   ALKPHOS 55  --   ALT 11  --   AST 26  --   GLUCOSE 108* 105*    Recent Labs  02/28/13 1500  TSH 3.338    Recent Labs  03/01/13 0500  INR 2.51*    Radiology/Studies X-ray Chest Pa And Lateral   02/28/2013  *RADIOLOGY REPORT*  Clinical Data: Atrial fibrillation.  Weakness.  CHEST - 2 VIEW  Comparison: 08/31/2011.  Findings: Cardiomegaly and aortic tortuosity are stable.  There is stable anterior eventration of the right hemidiaphragm.  The lungs are clear.  There is no pleural effusion.  Degenerative changes throughout the spine are unchanged.  IMPRESSION: Stable chest.  No acute cardiopulmonary process.   Original Report Authenticated By: Carey Bullocks, M.D.     12-lead ECG this AM shows SR with 1st degree AV block, widened QRS, prolonged QT interval Telemetry currently SR at 63 bpm; reviewed from last 24 hours - AF with SVR, pauses ~3 seconds   Assessment and Plan 1. Tachy-brady syndrome  2. PAF  Molly [name] presents with tachy-brady syndrome and meets criteria for PPM implantation. This will allow for optimization of her medical therapy for AF with rate/rhythm controlling medications. Risks, benefits and alternatives to PPM implantation were discussed in detail with Molly [name] today. These risks include, but are not limited to, bleeding, infection, pneumothorax, perforation, tamponade, vascular damage, renal failure, lead dislodgement, MI, stroke and death. She expressed verbal understanding and agrees to proceed.    Molly [name] to see Signed, Rick Duff, PA-C 03/02/2013, 7:21 AM EP  Attending  Patient seen and examined. She is well known to me with symptomatic tachybrady syndrome, atrial fibrillation with a rapid ventricular response and symptomatic sinus node dysfunction. She is not a candidate for amio or Tikosyn. PPM and sotalol will be only way to keep her in NSR at this point. I have discussed the risks/benefits/goals/expectations of PPM with the patient and her husband and they wish to proceed.  Leonia Reeves.D.

## 2013-03-02 NOTE — Progress Notes (Signed)
03/02/13  1310   To cath lab for Pacer. Chiquitta Matty, Linnell Fulling

## 2013-03-02 NOTE — Interval H&P Note (Signed)
History and Physical Interval Note:  03/02/2013 8:01 AM  Molly [name]  has presented today for surgery, with the diagnosis of Bradycardia  The various methods of treatment have been discussed with the patient and family. After consideration of risks, benefits and other options for treatment, the patient has consented to  Procedure(s): PERMANENT PACEMAKER INSERTION (N/A) as a surgical intervention .  The patient's history has been reviewed, patient examined, no change in status, stable for surgery.  I have reviewed the patient's chart and labs.  Questions were answered to the patient's satisfaction.     Lewayne Bunting

## 2013-03-03 ENCOUNTER — Inpatient Hospital Stay (HOSPITAL_COMMUNITY): Payer: Medicare Other

## 2013-03-03 DIAGNOSIS — J9819 Other pulmonary collapse: Secondary | ICD-10-CM | POA: Diagnosis not present

## 2013-03-03 DIAGNOSIS — I4891 Unspecified atrial fibrillation: Secondary | ICD-10-CM | POA: Diagnosis not present

## 2013-03-03 DIAGNOSIS — I495 Sick sinus syndrome: Secondary | ICD-10-CM | POA: Diagnosis not present

## 2013-03-03 DIAGNOSIS — I442 Atrioventricular block, complete: Secondary | ICD-10-CM | POA: Diagnosis not present

## 2013-03-03 DIAGNOSIS — N289 Disorder of kidney and ureter, unspecified: Secondary | ICD-10-CM | POA: Diagnosis not present

## 2013-03-03 MED ORDER — POLYETHYLENE GLYCOL 3350 17 G PO PACK
17.0000 g | PACK | Freq: Every day | ORAL | Status: DC | PRN
  Administered 2013-03-03: 17 g via ORAL
  Filled 2013-03-03: qty 1

## 2013-03-03 MED ORDER — SOTALOL HCL 120 MG PO TABS
120.0000 mg | ORAL_TABLET | Freq: Two times a day (BID) | ORAL | Status: DC
  Administered 2013-03-03 (×2): 120 mg via ORAL
  Filled 2013-03-03 (×4): qty 1

## 2013-03-03 MED ORDER — POLYETHYLENE GLYCOL 3350 17 G PO PACK: 17.0000 g | PACK | Freq: Every day | ORAL | Status: DC

## 2013-03-03 NOTE — Progress Notes (Signed)
Repeat EKG shows a QTc of 481.  Dr Anne Fu paged.

## 2013-03-03 NOTE — Progress Notes (Signed)
Sotalol 120mg  ordered to start this am, per am EKG done at 0530 5/9, QTc is , Dr. Anne Fu, on call, for Dr. Mayford Knife, called and made aware, to repeat 12 lead EKG and reconfer with Dr. Anne Fu regarding Sotalol administration, Berle Mull RN

## 2013-03-03 NOTE — Progress Notes (Signed)
SUBJECTIVE:  No complaints  OBJECTIVE:   Vitals:   Filed Vitals:   03/02/13 1659 03/02/13 2021 03/02/13 2035 03/03/13 0532  BP: 126/68 116/66  135/81  Pulse:  59 60 62  Temp:  97.6 F (36.4 C)  98.1 F (36.7 C)  TempSrc:  Oral  Oral  Resp: 16 18  18   Height:      Weight:      SpO2: 95% 93%  93%   I&O's:   Intake/Output Summary (Last 24 hours) at 03/03/13 0758 Last data filed at 03/03/13 0442  Gross per 24 hour  Intake 841.67 ml  Output   1450 ml  Net -608.33 ml   TELEMETRY: Reviewed telemetry pt in NSR:     PHYSICAL EXAM General: Well developed, well nourished, in no acute distress Head: Eyes PERRLA, No xanthomas.   Normal cephalic and atramatic  Lungs:   Clear bilaterally to auscultation and percussion. Heart:   HRRR S1 S2 Pulses are 2+ & equal. Abdomen: Bowel sounds are positive, abdomen soft and non-tender without masses Extremities:   No clubbing, cyanosis or edema.  DP +1 Neuro: Alert and oriented X 3. Psych:  Good affect, responds appropriately   LABS: Basic Metabolic Panel:  Recent Labs  16/10/96 1500 03/01/13 0500  NA 141 140  K 4.4 4.2  CL 107 107  CO2 25 24  GLUCOSE 108* 105*  BUN 23 22  CREATININE 1.20* 1.05  CALCIUM 10.7* 10.1  MG 2.1  --   PHOS 2.8  --    Liver Function Tests:  Recent Labs  02/28/13 1500  AST 26  ALT 11  ALKPHOS 55  BILITOT 0.3  PROT 6.6  ALBUMIN 3.6   No results found for this basename: LIPASE, AMYLASE,  in the last 72 hours CBC:  Recent Labs  02/28/13 1500  WBC 7.6  NEUTROABS 4.7  HGB 12.1  HCT 35.7*  MCV 96.5  PLT 194   Thyroid Function Tests:  Recent Labs  02/28/13 1500  TSH 3.338   Anemia Panel: No results found for this basename: VITAMINB12, FOLATE, FERRITIN, TIBC, IRON, RETICCTPCT,  in the last 72 hours Coag Panel:   Lab Results  Component Value Date   INR 2.18* 03/02/2013   INR 2.51* 03/01/2013   INR 2.58* 02/28/2013    RADIOLOGY: Dg Chest 2 View  03/03/2013  *RADIOLOGY REPORT*   Clinical Data: Status post pacemaker placement  CHEST - 2 VIEW  Comparison: 02/28/2013  Findings: The heart and pulmonary vascularity are stable.  A pacing device is now seen.  No pneumothorax is noted.  The lungs are well- aerated with mild left basilar atelectasis.  IMPRESSION: Mild left basilar atelectasis.  No pneumothorax is seen.   Original Report Authenticated By: Alcide Clever, M.D.    X-ray Chest Pa And Lateral   02/28/2013  *RADIOLOGY REPORT*  Clinical Data: Atrial fibrillation.  Weakness.  CHEST - 2 VIEW  Comparison: 08/31/2011.  Findings: Cardiomegaly and aortic tortuosity are stable.  There is stable anterior eventration of the right hemidiaphragm.  The lungs are clear.  There is no pleural effusion.  Degenerative changes throughout the spine are unchanged.  IMPRESSION: Stable chest.  No acute cardiopulmonary process.   Original Report Authenticated By: Carey Bullocks, M.D.     ASSESSMENT:  1. Afib with RVR with HR 170bpm on admission now in NSR on sotolol load 2. Tachybrady syndrome with profound bradycardia after first dose of sotolol now s/p PPM 3. HTN controlled 4. Symptomatic PVC's  5. Systemic anticoagulation with therapeutic INR  PLAN:  1.Continue Sotolol load - will need to stay in hospital until Sunday while loading Sotolol to watch QTc which has increased on other antiarrhythmic meds. 2.  Increase Sotolol to 120mg  BID per Dr. Ladona Ridgel to suppress PAF 3.  Continue Coumadin      Quintella Reichert, MD  03/03/2013  7:58 AM

## 2013-03-03 NOTE — Progress Notes (Signed)
     Patient: Molly [name] Date of Encounter: 03/03/2013, 12:03 PM Admit date: 02/28/2013     Subjective  Ms. Stille reports mild incisional soreness but has no other complaints. She denies CP or SOB. She and her husband are upset that she has not received any dose of Sotalol yet.   Objective  Physical Exam: Vitals: BP 135/81  Pulse 62  Temp(Src) 98.1 F (36.7 C) (Oral)  Resp 18  Ht 5\' 6"  (1.676 m)  Wt 183 lb (83.008 kg)  BMI 29.55 kg/m2  SpO2 93% General: Well developed, well appearing 79 year old female in no acute distress. Neck: Supple. JVD not elevated. Lungs: Clear bilaterally to auscultation without wheezes, rales, or rhonchi. Breathing is unlabored. Heart: Regular. S1 S2 present without murmurs, rubs, or gallops.  Abdomen: Soft, non-distended. Extremities: No clubbing or cyanosis. No edema.   Neuro: Alert and oriented X 3. Moves all extremities spontaneously. No focal deficits. Skin: Left upper chest/implant site intact without significant bleeding or hematoma.  Intake/Output:  Intake/Output Summary (Last 24 hours) at 03/03/13 1203 Last data filed at 03/03/13 1055  Gross per 24 hour  Intake    633 ml  Output   1000 ml  Net   -367 ml    Inpatient Medications:  . atorvastatin  10 mg Oral q1800  . LORazepam  0.25 mg Oral QHS  . pantoprazole  40 mg Oral Daily  . sodium chloride  3 mL Intravenous Q12H  . sodium chloride  3 mL Intravenous Q12H  . sotalol  120 mg Oral Q12H    Labs:  Recent Labs  02/28/13 1500 03/01/13 0500  NA 141 140  K 4.4 4.2  CL 107 107  CO2 25 24  GLUCOSE 108* 105*  BUN 23 22  CREATININE 1.20* 1.05  CALCIUM 10.7* 10.1  MG 2.1  --   PHOS 2.8  --     Recent Labs  02/28/13 1500  AST 26  ALT 11  ALKPHOS 55  BILITOT 0.3  PROT 6.6  ALBUMIN 3.6    Recent Labs  02/28/13 1500  WBC 7.6  NEUTROABS 4.7  HGB 12.1  HCT 35.7*  MCV 96.5  PLT 194    Recent Labs  02/28/13 1500  TSH 3.338    Recent Labs   03/02/13 0710  INR 2.18*    Radiology/Studies: Dg Chest 2 View 03/03/2013  *RADIOLOGY REPORT*  Clinical Data: Status post pacemaker placement  CHEST - 2 VIEW  Comparison: 02/28/2013  Findings: The heart and pulmonary vascularity are stable.  A pacing device is now seen.  No pneumothorax is noted.  The lungs are well- aerated with mild left basilar atelectasis.  IMPRESSION: Mild left basilar atelectasis.  No pneumothorax is seen.   Original Report Authenticated By: Alcide Clever, M.D.     Device interrogaton: performed by industry this AM shows normal PPM function    Assessment and Plan  1. Tachy-brady syndrome s/p PPM implantation 2. PAF Ms. Cronic is doing well post PPM implant yesterday. CXR shows stable lead placement without PTX. Device interrogation shows normal PPM function. Wound is intact without significant bleeding or hematoma. Reviewed activity restrictions, wound care and follow-up. Dr. Ladona Ridgel to see.   Signed, Exie Parody  EP Attending  Patient seen and examined. Agree with above. Her QT is minimally elevated because her QRS duration is out. Please start sotalol 120 mg twice daily. I will be around this weekend to monitor QT.  Leonia Reeves.D.

## 2013-03-03 NOTE — Op Note (Signed)
NAMECATALEYA, Molly [name] NO.:  [id number]  MEDICAL RECORD NO.:  [id number]  LOCATION:  2029                         FACILITY:  [facility]  PHYSICIAN:  Doylene Canning. Ladona Ridgel, MD    DATE OF BIRTH:  04-06-1933  DATE OF PROCEDURE:  03/02/2013 DATE OF DISCHARGE:                              OPERATIVE REPORT   PROCEDURE PERFORMED:  Insertion of a dual-chamber pacemaker.  INDICATION:  Symptomatic sinus node dysfunction and intermittent complete heart block.  INTRODUCTION:  The patient is a 79 year old woman with a history of atrial fibrillation with a rapid ventricular response as well as sinus node dysfunction and sinus bradycardia.  She was admitted to the hospital to initiate sotalol therapy.  Previously, she had been on amiodarone and developed lung toxicity.  Prior to her initial admission, she was experiencing rapid tachy palpitations and heart rates of over 150 beats per minute.  After initiation of sotalol, the patient developed intermittent complete heart block.  At other times, she was in atrial fibrillation with a rapid ventricular response at rates of over 150 beats per minute.  She is now referred for permanent pacemaker insertion, so that she might continue taking sotalol.  PROCEDURE:  After informed was obtained, the patient was taken to the diagnostic EP lab in the fasting state.  After usual preparation and draping, intravenous fentanyl and midazolam was given for sedation.  A 30 mL of lidocaine was infiltrated into the left infraclavicular region. A 5-cm incision was carried out over this region.  Electrocautery was utilized to dissect down to the fascial plane.  The left subclavian vein was punctured x2.  The Guidant dextrous model [id number] cm active fixation pacing lead, serial #[id number] was advanced into the right ventricle and the Guidant dextrous model 4135, active fixation pacing lead, serial #[id number] was advanced into the right atrium.  Mapping was carried  out in the right ventricle  first.  At the final site, the R-waves measured 11 mV.  The pacing impedance was 740 ohms.  The threshold was 0.5 V at 0.4 milliseconds.  With the right ventricular lead in satisfactory position, attention was then turned to placement of the atrial lead.  It was placed in the anterolateral portion of the right atrium.  The P- waves measured 3 mV.  The pacing impedance was 500 ohms and the threshold was 0.6 V at 0.4 milliseconds.  A 10 V pacing in both the atrium and the ventricle did not stimulate the diaphragm.  With these satisfactory parameters, the leads were secured to the subpectoralis fascia with a figure-of-eight silk suture.  Sewing sleeve was secured with silk suture.  Electrocautery was utilized to make a subcutaneous pocket.  Antibiotic irrigation was utilized to irrigate the pocket and electrocautery was utilized to assure hemostasis.  The [facility] Scientific ADVANTIO dual-chamber pacemaker, serial [id number] was connected to the atrial and ventricular leads and placed back in the subcutaneous pocket. The pocket was irrigated with antibiotic irrigation, and the incision was closed with 2-0 and 3-0 Vicryl suture.  Benzoin and Steri-Strips were painted on the skin, a pressure dressing was applied, and the patient was returned to her room in satisfactory condition.  COMPLICATIONS:  There were  no immediate procedure complications.  RESULTS:  Demonstrate successful implantation of a [employer] dual-chamber pacemaker in a patient with symptomatic sinus node dysfunction, and atrial fibrillation with a rapid ventricular response.     Doylene Canning. Ladona Ridgel, MD     GWT/MEDQ  D:  03/02/2013  T:  03/03/2013  Job:  [id number]  cc:   Armanda Magic, M.D.

## 2013-03-04 DIAGNOSIS — I442 Atrioventricular block, complete: Secondary | ICD-10-CM | POA: Diagnosis not present

## 2013-03-04 DIAGNOSIS — Z79899 Other long term (current) drug therapy: Secondary | ICD-10-CM | POA: Diagnosis not present

## 2013-03-04 DIAGNOSIS — I495 Sick sinus syndrome: Secondary | ICD-10-CM | POA: Diagnosis not present

## 2013-03-04 DIAGNOSIS — I4891 Unspecified atrial fibrillation: Secondary | ICD-10-CM | POA: Diagnosis not present

## 2013-03-04 DIAGNOSIS — N289 Disorder of kidney and ureter, unspecified: Secondary | ICD-10-CM | POA: Diagnosis not present

## 2013-03-04 MED ORDER — SOTALOL HCL 80 MG PO TABS
80.0000 mg | ORAL_TABLET | Freq: Two times a day (BID) | ORAL | Status: AC
  Administered 2013-03-04: 80 mg via ORAL
  Filled 2013-03-04: qty 1

## 2013-03-04 MED ORDER — HYDROCORTISONE 1 % EX CREA
TOPICAL_CREAM | Freq: Four times a day (QID) | CUTANEOUS | Status: DC
  Administered 2013-03-04 – 2013-03-06 (×6): via TOPICAL
  Filled 2013-03-04: qty 28

## 2013-03-04 MED ORDER — SOTALOL HCL 80 MG PO TABS
80.0000 mg | ORAL_TABLET | Freq: Two times a day (BID) | ORAL | Status: DC
  Filled 2013-03-04: qty 1

## 2013-03-04 NOTE — Progress Notes (Signed)
Patient Name: Molly [name] Date of Encounter: 03/04/2013    SUBJECTIVE: the patient feels comfortable. She does confirm that she received 2 doses of sotalol last evening, one at 2 PM and another at 4 PM. The nurse confirms that they were both 120 mg doses.  This morning the QTC is 544 milli-seconds and I've instructed no sotalol until the evening dose, depending on the QTC. Will discuss this with Dr. Ladona Ridgel  She denies chest pain and dyspnea.  TELEMETRY:  Paced rhythm Filed Vitals:   03/03/13 0532 03/03/13 1345 03/03/13 2053 03/04/13 0430  BP: 135/81 127/67 121/77 129/66  Pulse: 62 75 79 60  Temp: 98.1 F (36.7 C) 97.8 F (36.6 C) 98.2 F (36.8 C) 98.2 F (36.8 C)  TempSrc: Oral Oral Oral Oral  Resp: 18 18 18 19   Height:      Weight:      SpO2: 93% 97% 99% 94%    Intake/Output Summary (Last 24 hours) at 03/04/13 1019 Last data filed at 03/04/13 0700  Gross per 24 hour  Intake    603 ml  Output    550 ml  Net     53 ml    Radiology/Studies:  The chest x-ray is unremarkable. No pneumothorax post pacer implant  Physical Exam: Blood pressure 129/66, pulse 60, temperature 98.2 F (36.8 C), temperature source Oral, resp. rate 19, height 5\' 6"  (1.676 m), weight 83.008 kg (183 lb), SpO2 94.00%. Weight change:    Chest is clear.  Cardiac exam reveals no rub.  No peripheral edema.  ASSESSMENT:  1. Paroxysmal atrial fibrillation  2. Tachybradycardia syndrome, now with pacemaker in place  3. Sotalol therapy for atrial fibrillation suppression, with prolonged QTC this morning secondary to a dose of 240 mg last evening. QTc corrected for QRS prolongation is 500 msec(124-80 = 44; 544-44= 500 msec)  Plan:  1. Reevaluate QTC this evening and resume the 120 mg twice a day as ordered by Dr. Ladona Ridgel if appropriate. 2. Decrease the sotalol to 80 mg tonight.  Selinda Eon 03/04/2013, 10:19 AM

## 2013-03-04 NOTE — Progress Notes (Signed)
Dr Dorris Fetch order , " O.K. To give betapace "

## 2013-03-04 NOTE — Progress Notes (Signed)
RN notified attending on-call Dr.Smith concerning pt's ekg this am. Per EKG QT/QTC 544. MD to assess pt, no orders given at this time.   Leonie Green [phone]

## 2013-03-04 NOTE — Progress Notes (Signed)
DR, Dorris Fetch ON FLOOR AND AWARE OF EVENTS W/ BETAPACE. HE WILL REVIEW EKG'S AND NOTES AND WILL GIVE [place] ORDERS SHORTLY. R.N.. AWARE. ALSO ORDERED HYDROCORTISONE CREAM FOR RED AREA ON BACK FROM PACER PADS.  QID PRN.

## 2013-03-05 DIAGNOSIS — I495 Sick sinus syndrome: Secondary | ICD-10-CM | POA: Diagnosis not present

## 2013-03-05 DIAGNOSIS — Z79899 Other long term (current) drug therapy: Secondary | ICD-10-CM | POA: Diagnosis not present

## 2013-03-05 DIAGNOSIS — I442 Atrioventricular block, complete: Secondary | ICD-10-CM | POA: Diagnosis not present

## 2013-03-05 DIAGNOSIS — I4891 Unspecified atrial fibrillation: Secondary | ICD-10-CM | POA: Diagnosis not present

## 2013-03-05 DIAGNOSIS — N289 Disorder of kidney and ureter, unspecified: Secondary | ICD-10-CM | POA: Diagnosis not present

## 2013-03-05 LAB — PROTIME-INR
INR: 1.33 (ref 0.00–1.49)
Prothrombin Time: 16.2 seconds — ABNORMAL HIGH (ref 11.6–15.2)

## 2013-03-05 MED ORDER — WARFARIN SODIUM 7.5 MG PO TABS
7.5000 mg | ORAL_TABLET | Freq: Once | ORAL | Status: AC
  Administered 2013-03-05: 7.5 mg via ORAL
  Filled 2013-03-05: qty 1

## 2013-03-05 MED ORDER — WARFARIN - PHARMACIST DOSING INPATIENT: Freq: Every day | Status: DC

## 2013-03-05 MED ORDER — WARFARIN - PHYSICIAN DOSING INPATIENT: Freq: Every day | Status: DC

## 2013-03-05 MED ORDER — FUROSEMIDE 20 MG PO TABS
20.0000 mg | ORAL_TABLET | Freq: Every day | ORAL | Status: DC
  Administered 2013-03-05 – 2013-03-06 (×2): 20 mg via ORAL
  Filled 2013-03-05 (×2): qty 1

## 2013-03-05 MED ORDER — AMLODIPINE BESYLATE 5 MG PO TABS
5.0000 mg | ORAL_TABLET | Freq: Every day | ORAL | Status: DC
  Administered 2013-03-05 – 2013-03-06 (×2): 5 mg via ORAL
  Filled 2013-03-05 (×2): qty 1

## 2013-03-05 MED ORDER — SOTALOL HCL 80 MG PO TABS
80.0000 mg | ORAL_TABLET | Freq: Two times a day (BID) | ORAL | Status: DC
  Administered 2013-03-05 – 2013-03-06 (×3): 80 mg via ORAL
  Filled 2013-03-05 (×4): qty 1

## 2013-03-05 NOTE — Progress Notes (Signed)
ANTICOAGULATION CONSULT NOTE - Initial Consult  Pharmacy Consult for Warfarin Indication: atrial fibrillation  No Known Allergies  Patient Measurements: Height: 5\' 6"  (167.6 cm) Weight: 182 lb 14.4 oz (82.963 kg) IBW/kg (Calculated) : 59.3  Vital Signs: Temp: 98.3 F (36.8 C) (05/11 0451) Temp src: Oral (05/11 0451) BP: 136/63 mmHg (05/11 0451) Pulse Rate: 60 (05/11 0451)  Labs: No results found for this basename: HGB, HCT, PLT, APTT, LABPROT, INR, HEPARINUNFRC, CREATININE, CKTOTAL, CKMB, TROPONINI,  in the last 72 hours  Estimated Creatinine Clearance: 48.7 ml/min (by C-G formula based on Cr of 1.05).  Medical History: Past Medical History  Diagnosis Date  . Hypertension   . Atrial fibrillation   . High cholesterol   . Renal insufficiency   . Hyperparathyroidism   . Shortness of breath   . GERD (gastroesophageal reflux disease)    Medications:  Prescriptions prior to admission  . warfarin (COUMADIN) 5 MG tablet Take 2.5-5 mg by mouth daily. Takes 2.5 mg on Tuesday, Thursday and Sunday, 5 mg all other days.       Assessment: 80 yo female admitted with hx. of PAF, and slow heart rate.  She is now s/p Pacemaker without noted complications.  Her Warfarin is to be continued.  Her last INR was 23.3/2.18 which was drawn on 5/8.  No noted bleeding complications and no new labs.  HOME dose:   warfarin (COUMADIN) 5 MG tablet Take 2.5-5 mg by mouth daily. Takes 2.5 mg on Tuesday, Thursday and Sunday, 5 mg all other days.   Goal of Therapy:  INR 2-3 Monitor platelets by anticoagulation protocol: Yes   Plan:  1.  Give Warfarin 7.5mg  x 1 tonight 2.  Follow up AM PT/INR and adjust 3.  Monitor for bleeding complications.  Nadara Mustard, PharmD., MS Clinical Pharmacist Pager:  [phone] Thank you for allowing pharmacy to be part of this patients care team. 03/05/2013,11:41 AM

## 2013-03-05 NOTE — Progress Notes (Signed)
Called MD on call to clarify that it was okay for pt to receive dose of Betapace tonight. MD gave verbal  orders to give dose tonight, followed by EKG and to subtract 46 from the QTC per EKG to get pt's actual QTC interval. Will follow orders and continue to monitor.   Yarieliz Wasser M

## 2013-03-05 NOTE — Progress Notes (Signed)
EKG completed, Dr. Katrinka Blazing notified of Pt's QTC after am betapace dose. Orders to proceed with previous orders and continue to monitor pt.

## 2013-03-05 NOTE — Progress Notes (Addendum)
Patient Name: ARIYEL JEANGILLES Date of Encounter: 03/05/2013    SUBJECTIVE: Feels well. No chest pain or palpitations/dyspnea.  TELEMETRY:  A and AV pacing Filed Vitals:   03/04/13 0430 03/04/13 1423 03/04/13 2006 03/05/13 0451  BP: 129/66 125/77 135/63 136/63  Pulse: 60 63 62 60  Temp: 98.2 F (36.8 C) 97 F (36.1 C) 98.3 F (36.8 C) 98.3 F (36.8 C)  TempSrc: Oral Oral Oral Oral  Resp: 19 16 17 18   Height:      Weight:    82.963 kg (182 lb 14.4 oz)  SpO2: 94% 90% 98% 97%    Intake/Output Summary (Last 24 hours) at 03/05/13 1010 Last data filed at 03/04/13 2145  Gross per 24 hour  Intake    360 ml  Output      0 ml  Net    360 ml   BMET    Component Value Date/Time   NA 140 03/01/2013 0500   K 4.2 03/01/2013 0500   CL 107 03/01/2013 0500   CO2 24 03/01/2013 0500   GLUCOSE 105* 03/01/2013 0500   BUN 22 03/01/2013 0500   CREATININE 1.05 03/01/2013 0500   CALCIUM 10.1 03/01/2013 0500   GFRNONAA 50* 03/01/2013 0500   GFRAA 58* 03/01/2013 0500     Radiology/Studies:  No new  Physical Exam: Blood pressure 136/63, pulse 60, temperature 98.3 F (36.8 C), temperature source Oral, resp. rate 18, height 5\' 6"  (1.676 m), weight 82.963 kg (182 lb 14.4 oz), SpO2 97.00%. Weight change:    Pacer site flat and not draining.  Chest is clear. Cardiac without rub. No edema.  ASSESSMENT:  1. PAF, with no recurrence. 2. Sotalol therapy with QTc 434 msec corrected for QRS widening. 3. Tachy Brady syndrome, now on Sotalol and with pacer. Pacer pocket okay.   Plan:  1. Sotalol 80 mg BID 2. Multiple questions answered for wife and husband who are now hypervigilant after sotalol dosing error. 3. Resume coumadin. Selinda Eon 03/05/2013, 10:10 AM

## 2013-03-06 DIAGNOSIS — I4891 Unspecified atrial fibrillation: Secondary | ICD-10-CM

## 2013-03-06 DIAGNOSIS — I442 Atrioventricular block, complete: Secondary | ICD-10-CM | POA: Diagnosis not present

## 2013-03-06 DIAGNOSIS — I495 Sick sinus syndrome: Secondary | ICD-10-CM | POA: Diagnosis not present

## 2013-03-06 DIAGNOSIS — N289 Disorder of kidney and ureter, unspecified: Secondary | ICD-10-CM | POA: Diagnosis not present

## 2013-03-06 LAB — BASIC METABOLIC PANEL
Calcium: 10.3 mg/dL (ref 8.4–10.5)
Creatinine, Ser: 0.87 mg/dL (ref 0.50–1.10)
GFR calc non Af Amer: 63 mL/min — ABNORMAL LOW (ref >90)
Sodium: 139 mEq/L (ref 135–145)

## 2013-03-06 LAB — PROTIME-INR: INR: 1.27 (ref 0.00–1.49)

## 2013-03-06 MED ORDER — DILTIAZEM HCL ER COATED BEADS 180 MG PO CP24: 180.0000 mg | ORAL_CAPSULE | Freq: Every day | ORAL | Status: DC

## 2013-03-06 MED ORDER — DILTIAZEM HCL ER COATED BEADS 180 MG PO CP24
180.0000 mg | ORAL_CAPSULE | Freq: Every day | ORAL | Status: DC
  Filled 2013-03-06: qty 1

## 2013-03-06 MED ORDER — SOTALOL HCL 80 MG PO TABS: 80.0000 mg | ORAL_TABLET | Freq: Two times a day (BID) | ORAL | Status: DC

## 2013-03-06 NOTE — Progress Notes (Signed)
Patient ID: Molly [name], female   DOB: Jun 03, 1933, 79 y.o.   MRN: [id number] Subjective:  " I am ready to go home"  Objective:  Vital Signs in the last 24 hours: Temp:  [98 F (36.7 C)-98.5 F (36.9 C)] 98.5 F (36.9 C) (05/12 0437) Pulse Rate:  [58-60] 59 (05/12 0437) Resp:  [16-19] 19 (05/12 0437) BP: (105-132)/(50-73) 132/50 mmHg (05/12 0437) SpO2:  [93 %-100 %] 93 % (05/12 0437) Weight:  [180 lb 14.4 oz (82.056 kg)] 180 lb 14.4 oz (82.056 kg) (05/12 0437)  Intake/Output from previous day: 05/11 0701 - 05/12 0700 In: 250 [P.O.:250] Out: -  Intake/Output from this shift:    Physical Exam: Well appearing NAD HEENT: Unremarkable Neck:  No JVD, no thyromegally Back:  No CVA tenderness Lungs:  Clear with no wheezes, incision with no hematoma HEART:  Regular rate rhythm, no murmurs, no rubs, no clicks Abd:  soft, positive bowel sounds, no organomegally, no rebound, no guarding Ext:  2 plus pulses, no edema, no cyanosis, no clubbing Skin:  No rashes no nodules Neuro:  CN II through XII intact, motor grossly intact  Lab Results: No results found for this basename: WBC, HGB, PLT,  in the last 72 hours  Recent Labs  03/06/13 0430  NA 139  K 3.7  CL 107  CO2 23  GLUCOSE 98  BUN 15  CREATININE 0.87   No results found for this basename: TROPONINI, CK, MB,  in the last 72 hours Hepatic Function Panel No results found for this basename: PROT, ALBUMIN, AST, ALT, ALKPHOS, BILITOT, BILIDIR, IBILI,  in the last 72 hours No results found for this basename: CHOL,  in the last 72 hours No results found for this basename: PROTIME,  in the last 72 hours  Imaging: No results found.  12 Lead ECG - nsr with atrial pacing  Cardiac Studies: Tele - atrial pacing, brief atrial fib Assessment/Plan:  1. Atrial fib  - she is maintaining NSR on low dose sotalol with acceptable QT. She will need followup ECG in the next week or two. 2. Symptomatic bradycardia due to sinus node  dysfunction - s/p PPM doing well Rec: ok for discharge on Sotalol. Would also continue calcium channel blockers.  LOS: 6 days    Missy Baksh,M.D. 03/06/2013, 9:20 AM

## 2013-03-06 NOTE — Progress Notes (Signed)
QTC interval per  EKG was 520. MD on call said to subtract 46 from 520 which equals 474 reflecting pt's actual QTC interval.   Crespin Forstrom M

## 2013-03-06 NOTE — Progress Notes (Addendum)
SUBJECTIVE:  Doing well no complaints  OBJECTIVE:   Vitals:   Filed Vitals:   03/05/13 0451 03/05/13 1328 03/05/13 1948 03/06/13 0437  BP: 136/63 128/51 105/73 132/50  Pulse: 60 60 58 59  Temp: 98.3 F (36.8 C) 98 F (36.7 C) 98.3 F (36.8 C) 98.5 F (36.9 C)  TempSrc: Oral Oral Oral Oral  Resp: 18 16 17 19   Height:      Weight: 82.963 kg (182 lb 14.4 oz)   82.056 kg (180 lb 14.4 oz)  SpO2: 97% 100% 97% 93%   I&O's:   Intake/Output Summary (Last 24 hours) at 03/06/13 0853 Last data filed at 03/05/13 1700  Gross per 24 hour  Intake    130 ml  Output      0 ml  Net    130 ml   TELEMETRY: Reviewed telemetry pt in a paced rhythm     PHYSICAL EXAM General: Well developed, well nourished, in no acute distress Head: Eyes PERRLA, No xanthomas.   Normal cephalic and atramatic  Lungs:   Clear bilaterally to auscultation and percussion.  Pacer site clean dry and intact Heart:   HRRR S1 S2 Pulses are 2+ & equal. Abdomen: Bowel sounds are positive, abdomen soft and non-tender without masses Extremities:   No clubbing, cyanosis or edema.  DP +1 Neuro: Alert and oriented X 3. Psych:  Good affect, responds appropriately   LABS: Basic Metabolic Panel:  Recent Labs  21/30/86 0430  NA 139  K 3.7  CL 107  CO2 23  GLUCOSE 98  BUN 15  CREATININE 0.87  CALCIUM 10.3   Coag Panel:   Lab Results  Component Value Date   INR 1.27 03/06/2013   INR 1.33 03/05/2013   INR 2.18* 03/02/2013    RADIOLOGY: Dg Chest 2 View  03/03/2013  *RADIOLOGY REPORT*  Clinical Data: Status post pacemaker placement  CHEST - 2 VIEW  Comparison: 02/28/2013  Findings: The heart and pulmonary vascularity are stable.  A pacing device is now seen.  No pneumothorax is noted.  The lungs are well- aerated with mild left basilar atelectasis.  IMPRESSION: Mild left basilar atelectasis.  No pneumothorax is seen.   Original Report Authenticated By: Alcide Clever, M.D.    X-ray Chest Pa And Lateral   02/28/2013   *RADIOLOGY REPORT*  Clinical Data: Atrial fibrillation.  Weakness.  CHEST - 2 VIEW  Comparison: 08/31/2011.  Findings: Cardiomegaly and aortic tortuosity are stable.  There is stable anterior eventration of the right hemidiaphragm.  The lungs are clear.  There is no pleural effusion.  Degenerative changes throughout the spine are unchanged.  IMPRESSION: Stable chest.  No acute cardiopulmonary process.   Original Report Authenticated By: Carey Bullocks, M.D.     ASSESSMENT:  1. PAF, with occasional breakthrough with HR 130's.  2. Sotalol therapy with QTc corrected for QRS widening.  3. Tachy Brady syndrome s/p PPM  PLAN: 1.  D/c home today 2.  Followup with me in 1 week and in coumadin clinic on Thursday 3.  Add Cardizem 180mg  daily to try to suppress PAF       Quintella Reichert, MD  03/06/2013  8:53 AM

## 2013-03-06 NOTE — Discharge Summary (Signed)
Patient ID: Molly [name] MRN: [id number] DOB/AGE: 1933/01/24 79 y.o.  Admit date: 02/28/2013 Discharge date: 03/06/2013  Primary Discharge Diagnosis Atrial fibrillation with RVR Secondary Discharge Diagnosis  Tachybrady syndrome s/p PPM  HTN  PVC's  Dyslipidemia  Renal insufficiency     Consults: cardiology  - EP  Hospital Course: This is a 79 yo woman with PMH of HTN, atrial fibrillation on chronic anticoagulation and dyslipidemia who was admitted for drug loading with Sotolol.  On admission she was found to be in afib with RVR at 170bpm and was started on an IV Cardizem gtt.  Her HR became controlled and she was started on Betapace 80mg  BID and developed profound bradycardia into the 20's and was transferred to the CCU.  She subsequently underwent PPM placement by Dr. Ladona Ridgel and then was started back on Sotolol.  Her QTc had to be corrected for her her IVCD and remained stable on Sotolol load at on discharge.  She continued to have some bursts of PAF with RVR and was started on Cardizem CD 180mg  daily and Amlodipine was stopped.  Plan is for outpt uptitration of CCB as needed to suppress afib while keeping Sotolol at 80mg  BID to avoid QTc prolongation.  SHe was discharged home in stable condition.  Discharge Exam: Blood pressure 132/50, pulse 59, temperature 98.5 F (36.9 C), temperature source Oral, resp. rate 19, height 5\' 6"  (1.676 m), weight 82.056 kg (180 lb 14.4 oz), SpO2 93.00%.   General appearance: alert Resp: CTA bilaterally Cor:  RRR with no M/R/G GI: soft, non-tender; bowel sounds normal; no masses,  no organomegaly Extremities: extremities normal, atraumatic, no cyanosis or edema Labs:   Lab Results  Component Value Date   WBC 7.6 02/28/2013   HGB 12.1 02/28/2013   HCT 35.7* 02/28/2013   MCV 96.5 02/28/2013   PLT 194 02/28/2013    Recent Labs Lab 02/28/13 1500  03/06/13 0430  NA 141  < > 139  K 4.4  < > 3.7  CL 107  < > 107  CO2 25  < > 23  BUN 23  < > 15   CREATININE 1.20*  < > 0.87  CALCIUM 10.7*  < > 10.3  PROT 6.6  --   --   BILITOT 0.3  --   --   ALKPHOS 55  --   --   ALT 11  --   --   AST 26  --   --   GLUCOSE 108*  < > 98  < > = values in this interval not displayed. Lab Results  Component Value Date   CKTOTAL 74 05/23/2012   CKMB 2.3 05/23/2012   TROPONINI <0.30 05/23/2012    Lab Results  Component Value Date   CHOL 156 05/23/2012   Lab Results  Component Value Date   HDL 80 05/23/2012   Lab Results  Component Value Date   LDLCALC 59 05/23/2012   Lab Results  Component Value Date   TRIG 86 05/23/2012   Lab Results  Component Value Date   CHOLHDL 2.0 05/23/2012   No results found for this basename: LDLDIRECT      Radiology:*RADIOLOGY REPORT*  Clinical Data: Status post pacemaker placement  CHEST - 2 VIEW  Comparison: 02/28/2013  Findings: The heart and pulmonary vascularity are stable. A pacing  device is now seen. No pneumothorax is noted. The lungs are well-  aerated with mild left basilar atelectasis.  IMPRESSION:  Mild left basilar atelectasis. No pneumothorax  is seen.  Original Report Authenticated By: Alcide Clever, M.D.  EKG:A paced rhythm with IVCD  FOLLOW UP PLANS AND APPOINTMENTS Discharge Orders   Future Appointments Provider Department Dept Phone   03/13/2013 2:00 PM [facility] [employer] Main Office [place]) [phone]   06/07/2013 11:00 AM Marinus Maw, MD [facility] Main Office [place]) [phone]   Future Orders Complete By Expires     Diet - low sodium heart healthy  As directed     Driving Restrictions  As directed     Comments:      No driving for 2 weeks    Increase activity slowly  As directed     Lifting restrictions  As directed     Comments:      No lifting more than 10 pounds for 2 weeks and follow pacer instructions        Medication List    STOP taking these medications       amLODipine 5 MG tablet  Commonly known as:  NORVASC      TAKE  these medications       acetaminophen 500 MG tablet  Commonly known as:  TYLENOL  Take 1,000 mg by mouth every 6 (six) hours as needed. For pain     diltiazem 180 MG 24 hr capsule  Commonly known as:  CARDIZEM CD  Take 1 capsule (180 mg total) by mouth daily.     furosemide 20 MG tablet  Commonly known as:  LASIX  Take 20 mg by mouth daily.     LORazepam 0.5 MG tablet  Commonly known as:  ATIVAN  Take 0.25 mg by mouth at bedtime.     multivitamin with minerals Tabs  Take 1 tablet by mouth daily.     omeprazole 20 MG capsule  Commonly known as:  PRILOSEC  Take 20 mg by mouth daily.     simvastatin 20 MG tablet  Commonly known as:  ZOCOR  Take 20 mg by mouth every evening.     sotalol 80 MG tablet  Commonly known as:  BETAPACE  Take 1 tablet (80 mg total) by mouth every 12 (twelve) hours.     warfarin 5 MG tablet  Commonly known as:  COUMADIN  Take 2.5-5 mg by mouth daily. Takes 2.5 mg on Tuesday, Thursday and Sunday, 5 mg all other days.           Follow-up Information   Follow up with [facility] On 03/13/2013. (At 2:00 PM for wound check)    Contact information:   [address] [place] [address] [phone]      Follow up with Lewayne Bunting, MD On 06/07/2013. (At 11:00 AM)    Contact information:   [address] [place] [address] [phone]      Follow up with Quintella Reichert, MD On 03/16/2013. (at 4:15pm)    Contact information:   [address] [place] [address] [phone]       Follow up with Quintella Reichert, MD On 03/09/2013. (coumadin clinic at 11:30am)    Contact information:   [address] [place] [address] [phone]       BRING ALL MEDICATIONS WITH YOU TO FOLLOW UP APPOINTMENTS  Time spent with patient to include physician time:40 minutes Signed: Alistar Mcenery R 03/06/2013, 11:09 AM

## 2013-03-09 DIAGNOSIS — Z7901 Long term (current) use of anticoagulants: Secondary | ICD-10-CM | POA: Diagnosis not present

## 2013-03-09 DIAGNOSIS — I4891 Unspecified atrial fibrillation: Secondary | ICD-10-CM | POA: Diagnosis not present

## 2013-03-10 ENCOUNTER — Telehealth: Payer: Self-pay | Admitting: Internal Medicine

## 2013-03-10 NOTE — Telephone Encounter (Signed)
Spoke with patient and it sounds like she may be having PVC's.  Her HR is 60 but she is feeling some palpitations.  It does not feel fast only skipping at times.  She has an appointment on Mon for her wound check.  She does not have any other symptoms associated with her palitations

## 2013-03-10 NOTE — Telephone Encounter (Signed)
Pt having a-fib after device placement , has appt Monday but wants to talk to nurse before the weekend

## 2013-03-13 ENCOUNTER — Ambulatory Visit (INDEPENDENT_AMBULATORY_CARE_PROVIDER_SITE_OTHER): Payer: Medicare Other | Admitting: *Deleted

## 2013-03-13 ENCOUNTER — Encounter: Payer: Self-pay | Admitting: Internal Medicine

## 2013-03-13 DIAGNOSIS — I4891 Unspecified atrial fibrillation: Secondary | ICD-10-CM | POA: Diagnosis not present

## 2013-03-13 LAB — PACEMAKER DEVICE OBSERVATION
AL AMPLITUDE: 4.7 mv
AL IMPEDENCE PM: 630 Ohm
ATRIAL PACING PM: 84
DEVICE MODEL PM: 112392
RV LEAD AMPLITUDE: 11.8 mv
VENTRICULAR PACING PM: 21

## 2013-03-13 NOTE — Progress Notes (Signed)
Wound check ppm in clinic. Battery longevity 14 years. Normal device function. ROV 06-07-13 @ 1100 with GT.

## 2013-03-16 DIAGNOSIS — Z950 Presence of cardiac pacemaker: Secondary | ICD-10-CM | POA: Diagnosis not present

## 2013-03-16 DIAGNOSIS — I10 Essential (primary) hypertension: Secondary | ICD-10-CM | POA: Diagnosis not present

## 2013-03-16 DIAGNOSIS — I498 Other specified cardiac arrhythmias: Secondary | ICD-10-CM | POA: Diagnosis not present

## 2013-03-16 DIAGNOSIS — Z7901 Long term (current) use of anticoagulants: Secondary | ICD-10-CM | POA: Diagnosis not present

## 2013-03-16 DIAGNOSIS — I4949 Other premature depolarization: Secondary | ICD-10-CM | POA: Diagnosis not present

## 2013-03-16 DIAGNOSIS — Z79899 Other long term (current) drug therapy: Secondary | ICD-10-CM | POA: Diagnosis not present

## 2013-03-16 DIAGNOSIS — I4891 Unspecified atrial fibrillation: Secondary | ICD-10-CM | POA: Diagnosis not present

## 2013-03-30 DIAGNOSIS — I4891 Unspecified atrial fibrillation: Secondary | ICD-10-CM | POA: Diagnosis not present

## 2013-03-30 DIAGNOSIS — Z79899 Other long term (current) drug therapy: Secondary | ICD-10-CM | POA: Diagnosis not present

## 2013-03-30 DIAGNOSIS — Z7901 Long term (current) use of anticoagulants: Secondary | ICD-10-CM | POA: Diagnosis not present

## 2013-03-30 DIAGNOSIS — Z950 Presence of cardiac pacemaker: Secondary | ICD-10-CM | POA: Diagnosis not present

## 2013-03-30 DIAGNOSIS — I10 Essential (primary) hypertension: Secondary | ICD-10-CM | POA: Diagnosis not present

## 2013-03-30 DIAGNOSIS — I498 Other specified cardiac arrhythmias: Secondary | ICD-10-CM | POA: Diagnosis not present

## 2013-04-06 DIAGNOSIS — Z7901 Long term (current) use of anticoagulants: Secondary | ICD-10-CM | POA: Diagnosis not present

## 2013-04-06 DIAGNOSIS — I4891 Unspecified atrial fibrillation: Secondary | ICD-10-CM | POA: Diagnosis not present

## 2013-04-10 DIAGNOSIS — I4891 Unspecified atrial fibrillation: Secondary | ICD-10-CM | POA: Diagnosis not present

## 2013-04-10 DIAGNOSIS — R609 Edema, unspecified: Secondary | ICD-10-CM | POA: Diagnosis not present

## 2013-04-10 DIAGNOSIS — F411 Generalized anxiety disorder: Secondary | ICD-10-CM | POA: Diagnosis not present

## 2013-04-10 DIAGNOSIS — I10 Essential (primary) hypertension: Secondary | ICD-10-CM | POA: Diagnosis not present

## 2013-04-10 DIAGNOSIS — K219 Gastro-esophageal reflux disease without esophagitis: Secondary | ICD-10-CM | POA: Diagnosis not present

## 2013-04-10 DIAGNOSIS — E782 Mixed hyperlipidemia: Secondary | ICD-10-CM | POA: Diagnosis not present

## 2013-04-10 NOTE — Progress Notes (Signed)
ELECTROPHYSIOLOGY OFFICE NOTE  Patient ID: NYOMIE EHRLICH MRN: [id number], DOB/AGE: Jun 01, 1933   Date of Visit: 04/16/2013  Primary Physician: Sissy Hoff, MD Primary Cardiologist / EP: Mayford Knife, MD / Ladona Ridgel, MD Reason for Visit: EP/device follow-up  History of Present Illness  Molly [name] is a 79 year old woman with PAF, tachy-brady syndrome s/p recent PPM implant and HTN who presents today for electrophysiology followup.   In May 2014, she was admitted to [facility] to initiate sotalol therapy. Previously, she had been on amiodarone and developed lung toxicity. Prior to her admission, she was experiencing racing palpitations with heart rates >150 beats per minute. After initiation of sotalol, the patient developed intermittent complete heart block. At other times, she was in atrial fibrillation with a rapid rates >150 beats per minute. She then underwent PPM implantation to allow for AAD therapy and rate control.  Since discharge,  she reports she is doing well overall but she has noticed some DOE and exertional fatigue which is new since PPM implant. In addition, her implant site appears to be well healing but she reports intense itching at times with sutures "sticking out of the skin." She denies chest pain or shortness of breath. She denies palpitations, dizziness, near syncope or syncope. She denies LE swelling, orthopnea, PND or recent weight gain. Ms. Bevill states that she is compliant and tolerating medications without difficulty.  Past Medical History Past Medical History  Diagnosis Date  . Hypertension   . Atrial fibrillation   . High cholesterol   . Renal insufficiency   . Hyperparathyroidism   . Shortness of breath   . GERD (gastroesophageal reflux disease)     Past Surgical History Past Surgical History  Procedure Laterality Date  . Shoulder surgery    . Cardiac catheterization  09/25/2000    EF of 50% -- with normal left ventricular size and function  . Cesarean  section    . Cesarean section    . Tonsillectomy    . Shoulder arthroscopy w/ rotator cuff repair    . Bunionectomy      Right  . Varicose vein surgery    . Foot surgery      Allergies/Intolerances No Known Allergies  Current Home Medications Current Outpatient Prescriptions  Medication Sig Dispense Refill  . acetaminophen (TYLENOL) 500 MG tablet Take 1,000 mg by mouth every 6 (six) hours as needed. For pain      . diltiazem (CARDIZEM CD) 180 MG 24 hr capsule Take 1 capsule (180 mg total) by mouth daily.  30 capsule  11  . furosemide (LASIX) 20 MG tablet Take 20 mg by mouth daily.      Marland Kitchen LORazepam (ATIVAN) 0.5 MG tablet Take 0.25 mg by mouth at bedtime.      . Multiple Vitamin (MULTIVITAMIN WITH MINERALS) TABS Take 1 tablet by mouth daily.      Marland Kitchen omeprazole (PRILOSEC) 20 MG capsule Take 20 mg by mouth daily.      . simvastatin (ZOCOR) 20 MG tablet Take 20 mg by mouth every evening.      . sotalol (BETAPACE) 80 MG tablet Take 1 tablet (80 mg total) by mouth every 12 (twelve) hours.  60 tablet  11  . warfarin (COUMADIN) 5 MG tablet Take 2.5-5 mg by mouth daily. Takes 2.5 mg on Tuesday, Thursday and Sunday, 5 mg all other days.       No current facility-administered medications for this visit.   Social History Social History  .  Marital Status: Married   Social History Main Topics  . Smoking status: Never Smoker   . Smokeless tobacco: Never Used  . Alcohol Use: No  . Drug Use: No    Review of Systems General: No chills, fever, night sweats or weight changes Cardiovascular: No chest pain, dyspnea on exertion, edema, orthopnea, palpitations, paroxysmal nocturnal dyspnea Dermatological: No rash, lesions or masses Respiratory: No cough, dyspnea Urologic: No hematuria, dysuria Abdominal: No nausea, vomiting, diarrhea, bright red blood per rectum, melena, or hematemesis Neurologic: No visual changes, weakness, changes in mental status All other systems reviewed and are otherwise  negative except as noted above.  Physical Exam Blood pressure 120/60, pulse 60, height 5\' 6"  (1.676 m), weight 181 lb 1.9 oz (82.155 kg).  General: Well developed, well appearing 79 year old female in no acute distress. HEENT: Normocephalic, atraumatic. EOMs intact. Sclera nonicteric. Oropharynx clear.  Neck: Supple without bruits. No JVD. Lungs: Respirations regular and unlabored, CTA bilaterally. No wheezes, rales or rhonchi. Heart: RRR. S1, S2 present. No murmurs, rub, S3 or S4. Abdomen: Soft, non-distended. Extremities: No clubbing, cyanosis or edema. DP/PT/Radials 2+ and equal bilaterally. Psych: Normal affect. Neuro: Alert and oriented X 3. Moves all extremities spontaneously. Skin: Left upper chest/implant site intact and well healing without any evidence of infection. 3 small sutures protruding from lateral aspects of incision.   Diagnostics Device interrogation today - Normal device function. Thresholds, sensing, impedances consistent with previous measurements. 10 ATR episodes, longest 1 minute 13 seconds. 2 high ventricular rates noted, longest 12 seconds, EGMs show an atrial tachycardia with 1:1 conduction. No evidence of ventricular arrhythmias. Device programmed at appropriate safety margins. Histogram distribution flat/blunted for patient activity level. Turned rate response on. Device programmed to optimize intrinsic conduction. Estimated longevity 12.5 years.   Assessment and Plan 1. DOE and exertional fatigue since PPM implant Histogram distribution / heart rate excursion is flat / blunted so rate response was turned on and programmed according to patient activity level She will call in 1 week to let us know how she feels 2. Tachy-brady syndrome s/p PPM implant Normal device function Other than turning on rate response there were no programming changes made Return for follow-up with Dr. Ladona Ridgel as scheduled for 3 month post implant f/u 3. PAF Stable Continue AAD therapy  with sotalol as directed by Dr. Mayford Knife and Dr. Ladona Ridgel Continue warfarin for embolic prophylaxis 4. Suture removal Implant site is well healed without evidence of infection All 3 sutures were removed without difficulty  Signed, Valene Villa, PA-C 04/16/2013, 10:36 AM

## 2013-04-11 ENCOUNTER — Encounter: Payer: Self-pay | Admitting: Cardiology

## 2013-04-11 ENCOUNTER — Encounter: Payer: Self-pay | Admitting: Internal Medicine

## 2013-04-11 ENCOUNTER — Ambulatory Visit (INDEPENDENT_AMBULATORY_CARE_PROVIDER_SITE_OTHER): Payer: Medicare Other | Admitting: Cardiology

## 2013-04-11 VITALS — BP 120/60 | HR 60 | Ht 66.0 in | Wt 181.1 lb

## 2013-04-11 DIAGNOSIS — I495 Sick sinus syndrome: Secondary | ICD-10-CM | POA: Diagnosis not present

## 2013-04-11 DIAGNOSIS — R0609 Other forms of dyspnea: Secondary | ICD-10-CM

## 2013-04-11 DIAGNOSIS — R6889 Other general symptoms and signs: Secondary | ICD-10-CM

## 2013-04-11 DIAGNOSIS — I4891 Unspecified atrial fibrillation: Secondary | ICD-10-CM

## 2013-04-11 DIAGNOSIS — Z950 Presence of cardiac pacemaker: Secondary | ICD-10-CM | POA: Diagnosis not present

## 2013-04-11 DIAGNOSIS — R0989 Other specified symptoms and signs involving the circulatory and respiratory systems: Secondary | ICD-10-CM | POA: Diagnosis not present

## 2013-04-11 DIAGNOSIS — I480 Paroxysmal atrial fibrillation: Secondary | ICD-10-CM

## 2013-04-11 DIAGNOSIS — Z4802 Encounter for removal of sutures: Secondary | ICD-10-CM

## 2013-04-11 LAB — PACEMAKER DEVICE OBSERVATION
ATRIAL PACING PM: 87
RV LEAD IMPEDENCE PM: 708 Ohm
RV LEAD THRESHOLD: 0.6 V
VENTRICULAR PACING PM: 8

## 2013-04-16 ENCOUNTER — Encounter: Payer: Self-pay | Admitting: Cardiology

## 2013-04-17 ENCOUNTER — Telehealth: Payer: Self-pay | Admitting: Internal Medicine

## 2013-04-17 NOTE — Telephone Encounter (Signed)
Spoke with patient who c/o fatigue, feeling extra beats, sometimes continuously.  States she saw Shanon Rosser, PA-C last Tuesday 6/17 and Brooke made adjustments to her pacemaker (turned rate response on per chart review).  Patient states she has not felt better since that time.  She states the only relief she gets from the extra beats is to lay on her right side.  States HR is approximately 68.  Patient states she feels so bad that she is unable to get out and do anything. I advised her that I would talk with Dr. Graciela Husbands, DOD for advise.  Dr. Graciela Husbands recommends that Dr. Ladona Ridgel make decision regarding next step in plan of care unless patient is unstable presently.  Since patient is not unstable at present, Dennis Bast, RN for Dr. Ladona Ridgel advised that patient come in for ov on 6/24 @ 10:45.  Patient verbalized understanding and agreement with plan of care.

## 2013-04-17 NOTE — Telephone Encounter (Signed)
LMTCB

## 2013-04-17 NOTE — Telephone Encounter (Signed)
New Problem  Pt states she is feeling a lot worse since seeing Brooke on 6.17.14. She wants to speak with someone.

## 2013-04-18 ENCOUNTER — Encounter: Payer: Self-pay | Admitting: Internal Medicine

## 2013-04-18 ENCOUNTER — Ambulatory Visit (INDEPENDENT_AMBULATORY_CARE_PROVIDER_SITE_OTHER): Payer: Medicare Other | Admitting: Internal Medicine

## 2013-04-18 VITALS — BP 148/70 | HR 62 | Ht 66.5 in | Wt 180.4 lb

## 2013-04-18 DIAGNOSIS — I4949 Other premature depolarization: Secondary | ICD-10-CM | POA: Diagnosis not present

## 2013-04-18 DIAGNOSIS — Z950 Presence of cardiac pacemaker: Secondary | ICD-10-CM | POA: Diagnosis not present

## 2013-04-18 DIAGNOSIS — I4891 Unspecified atrial fibrillation: Secondary | ICD-10-CM

## 2013-04-18 DIAGNOSIS — I493 Ventricular premature depolarization: Secondary | ICD-10-CM

## 2013-04-18 LAB — PACEMAKER DEVICE OBSERVATION

## 2013-04-18 NOTE — Assessment & Plan Note (Signed)
She is mostly maintaining sinus rhythm on sotalol therapy. She will continue anticoagulation.

## 2013-04-18 NOTE — Assessment & Plan Note (Signed)
Her [employer] dual-chamber pacemaker is working normally. Today we increased her rate from 60-70 beats per minute. Hopefully this will improve her symptoms. If not, I will find no cardiac reason for her feeling poorly although beta blockers and calcium channel blockers could be medications that are making her more tired.

## 2013-04-18 NOTE — Progress Notes (Signed)
HPI Molly [name] returns today for followup. She is a very pleasant 78 year old woman with a history of symptomatic bradycardia, paroxysmal atrial fibrillation. Because of worsening and chronic fatigue despite pacemaker insertion, she was seen in our office last week and the rate response feature of her pacemaker was increased. She does not think she feels any better. When she describes her symptoms she can only state that she feels bad. No specific complaints. She notes occasional palpitations. No chest pain and no syncope. She has little energy. No nausea or vomiting. No Known Allergies   Current Outpatient Prescriptions  Medication Sig Dispense Refill  . acetaminophen (TYLENOL) 500 MG tablet Take 1,000 mg by mouth every 6 (six) hours as needed. For pain      . diltiazem (CARDIZEM CD) 180 MG 24 hr capsule Take 1 capsule (180 mg total) by mouth daily.  30 capsule  11  . furosemide (LASIX) 20 MG tablet Take 20 mg by mouth daily.      Marland Kitchen LORazepam (ATIVAN) 0.5 MG tablet Take 0.25 mg by mouth at bedtime.      . Multiple Vitamin (MULTIVITAMIN WITH MINERALS) TABS Take 1 tablet by mouth daily.      Marland Kitchen omeprazole (PRILOSEC) 20 MG capsule Take 20 mg by mouth daily.      . simvastatin (ZOCOR) 20 MG tablet Take 20 mg by mouth every evening.      . sotalol (BETAPACE) 80 MG tablet Take 1 tablet (80 mg total) by mouth every 12 (twelve) hours.  60 tablet  11  . warfarin (COUMADIN) 5 MG tablet Take 2.5-5 mg by mouth daily. Takes 2.5 mg on Tuesday, Thursday and Sunday, 5 mg all other days.       No current facility-administered medications for this visit.     Past Medical History  Diagnosis Date  . Hypertension   . Atrial fibrillation   . High cholesterol   . Renal insufficiency   . Hyperparathyroidism   . Shortness of breath   . GERD (gastroesophageal reflux disease)     ROS:   All systems reviewed and negative except as noted in the HPI.   Past Surgical History  Procedure Laterality Date  .  Shoulder surgery    . Cardiac catheterization  09/25/2000    EF of 50% -- with normal left ventricular size and function  . Cesarean section    . Cesarean section    . Tonsillectomy    . Shoulder arthroscopy w/ rotator cuff repair    . Bunionectomy      Right  . Varicose vein surgery    . Foot surgery       No family history on file.   History   Social History  . Marital Status: Married    Spouse Name: N/A    Number of Children: N/A  . Years of Education: N/A   Occupational History  . Not on file.   Social History Main Topics  . Smoking status: Never Smoker   . Smokeless tobacco: Never Used  . Alcohol Use: No  . Drug Use: No  . Sexually Active: Not on file   Other Topics Concern  . Not on file   Social History Narrative  . No narrative on file     BP 148/70  Pulse 62  Ht 5' 6.5" (1.689 m)  Wt 180 lb 6.4 oz (81.829 kg)  BMI 28.68 kg/m2  Physical Exam:  Well appearing 79 year old woman, NAD HEENT: Unremarkable Neck:  6 cm  JVD, no thyromegally Back:  No CVA tenderness Lungs:  Clear with no wheezes, rales, or rhonchi. Well-healed pacemaker incision. HEART:  Regular rate rhythm, no murmurs, no rubs, no clicks Abd:  soft, positive bowel sounds, no organomegally, no rebound, no guarding Ext:  2 plus pulses, no edema, no cyanosis, no clubbing Skin:  No rashes no nodules Neuro:  CN II through XII intact, motor grossly intact  DEVICE  Normal device function.  See PaceArt for details.   Assess/Plan:

## 2013-04-18 NOTE — Assessment & Plan Note (Signed)
She has had approximately 2000 PVCs since her last visit. It is unclear to me how symptomatic she is from these.

## 2013-04-18 NOTE — Patient Instructions (Addendum)
Keep follow up the same in Aug

## 2013-04-25 ENCOUNTER — Ambulatory Visit: Payer: Medicare Other | Admitting: Internal Medicine

## 2013-04-27 ENCOUNTER — Encounter: Payer: Self-pay | Admitting: Internal Medicine

## 2013-05-03 DIAGNOSIS — Z7901 Long term (current) use of anticoagulants: Secondary | ICD-10-CM | POA: Diagnosis not present

## 2013-05-03 DIAGNOSIS — I4891 Unspecified atrial fibrillation: Secondary | ICD-10-CM | POA: Diagnosis not present

## 2013-05-19 ENCOUNTER — Telehealth: Payer: Self-pay | Admitting: Internal Medicine

## 2013-05-19 NOTE — Telephone Encounter (Signed)
Patient scheduled for office visit 05/22/13.

## 2013-05-19 NOTE — Telephone Encounter (Signed)
Spoke with patient. Pt states she is having more frequent PVCs in the last week.

## 2013-05-19 NOTE — Telephone Encounter (Signed)
New Prob  Pt states that she has been feeling bad lately, really tired and PVC beats.

## 2013-05-19 NOTE — Telephone Encounter (Signed)
Pt states she saw Dr Ladona Ridgel 04/18/13 and  adjustments were made in her device. Pt states  her PVCs are worse since her office visit with Dr Ladona Ridgel.  She does have a history of a fib but feels she is having PVCs. I will forward to device clinic for recommendations about interrogating device.

## 2013-05-19 NOTE — Telephone Encounter (Signed)
Reviewed with Dr Patty Sermons. Per Dr Drema Balzarine new recommendations except to limit caffeine and forward to Dr Ladona Ridgel for further recommendations. Pt advised, verbalized understanding.

## 2013-05-24 NOTE — Telephone Encounter (Signed)
Spoke with patient and let her know Dr Ladona Ridgel did not have any further recommendations and will see her on 06/07/13

## 2013-05-31 DIAGNOSIS — Z7901 Long term (current) use of anticoagulants: Secondary | ICD-10-CM | POA: Diagnosis not present

## 2013-05-31 DIAGNOSIS — I4891 Unspecified atrial fibrillation: Secondary | ICD-10-CM | POA: Diagnosis not present

## 2013-06-07 ENCOUNTER — Encounter: Payer: Self-pay | Admitting: Internal Medicine

## 2013-06-07 ENCOUNTER — Ambulatory Visit (INDEPENDENT_AMBULATORY_CARE_PROVIDER_SITE_OTHER): Payer: Medicare Other | Admitting: Internal Medicine

## 2013-06-07 VITALS — BP 137/84 | HR 70 | Ht 66.0 in | Wt 178.8 lb

## 2013-06-07 DIAGNOSIS — I4949 Other premature depolarization: Secondary | ICD-10-CM | POA: Diagnosis not present

## 2013-06-07 DIAGNOSIS — Z950 Presence of cardiac pacemaker: Secondary | ICD-10-CM | POA: Diagnosis not present

## 2013-06-07 DIAGNOSIS — R5381 Other malaise: Secondary | ICD-10-CM

## 2013-06-07 DIAGNOSIS — R5383 Other fatigue: Secondary | ICD-10-CM

## 2013-06-07 DIAGNOSIS — I493 Ventricular premature depolarization: Secondary | ICD-10-CM

## 2013-06-07 DIAGNOSIS — I4891 Unspecified atrial fibrillation: Secondary | ICD-10-CM

## 2013-06-07 LAB — PACEMAKER DEVICE OBSERVATION
AL IMPEDENCE PM: 641 Ohm
AL THRESHOLD: 0.8 V
RV LEAD AMPLITUDE: 8.9 mv
RV LEAD IMPEDENCE PM: 712 Ohm
RV LEAD THRESHOLD: 0.6 V

## 2013-06-07 NOTE — Assessment & Plan Note (Signed)
I've recommended that she continue taking sotalol as pacemaker interrogation today demonstrates that her atrial fibrillation has been quiet, less than 1% of the time.

## 2013-06-07 NOTE — Assessment & Plan Note (Signed)
hher density of PVCs is also much improved. She will continue her current medications. Hopefully weaning the Cardizem, will not increase her PVCs.

## 2013-06-07 NOTE — Progress Notes (Signed)
HPI [name] returns today for followup. She is a very pleasant 79 year old woman with a history of frequent PVCs, and atrial fibrillation. The patient has been on multiple medications in attempt to control her arrhythmias. Most recently she was placed back on amiodarone, which she did not tolerate. She was then placed on sotalol in conjunction with calcium channel blockers. In the interim she notes fatigue and weakness. The patient has had some palpitations but these are overall improved. She returns today for followup. No syncope, and no chest pain. She has minimal dyspnea with exertion.  No Known Allergies   Current Outpatient Prescriptions  Medication Sig Dispense Refill  . acetaminophen (TYLENOL) 500 MG tablet Take 1,000 mg by mouth every 6 (six) hours as needed. For pain      . furosemide (LASIX) 20 MG tablet Take 20 mg by mouth daily.      Marland Kitchen LORazepam (ATIVAN) 0.5 MG tablet Take 0.25 mg by mouth at bedtime.      . Multiple Vitamin (MULTIVITAMIN WITH MINERALS) TABS Take 1 tablet by mouth daily.      Marland Kitchen omeprazole (PRILOSEC) 20 MG capsule Take 20 mg by mouth daily.      . simvastatin (ZOCOR) 20 MG tablet Take 10 mg by mouth every evening.       . sotalol (BETAPACE) 80 MG tablet Take 1 tablet (80 mg total) by mouth every 12 (twelve) hours.  60 tablet  11  . warfarin (COUMADIN) 5 MG tablet Take 2.5-5 mg by mouth daily. Takes 2.5 mg on Tuesday, Thursday and Sunday, 5 mg all other days.       No current facility-administered medications for this visit.     Past Medical History  Diagnosis Date  . Hypertension   . Atrial fibrillation   . High cholesterol   . Renal insufficiency   . Hyperparathyroidism   . Shortness of breath   . GERD (gastroesophageal reflux disease)     ROS:   All systems reviewed and negative except as noted in the HPI.   Past Surgical History  Procedure Laterality Date  . Shoulder surgery    . Cardiac catheterization  09/25/2000    EF of 50% -- with  normal left ventricular size and function  . Cesarean section    . Cesarean section    . Tonsillectomy    . Shoulder arthroscopy w/ rotator cuff repair    . Bunionectomy      Right  . Varicose vein surgery    . Foot surgery       No family history on file.   History   Social History  . Marital Status: Married    Spouse Name: Molly [name]    Number of Children: Molly [name]  . Years of Education: Molly [name]   Occupational History  . Not on file.   Social History Main Topics  . Smoking status: Never Smoker   . Smokeless tobacco: Never Used  . Alcohol Use: No  . Drug Use: No  . Sexual Activity: Not on file   Other Topics Concern  . Not on file   Social History Narrative  . No narrative on file     BP 137/84  Pulse 70  Ht 5\' 6"  (1.676 m)  Wt 178 lb 12.8 oz (81.103 kg)  BMI 28.87 kg/m2  Physical Exam:  Well appearing 79 year old woman,NAD HEENT: Unremarkable Neck:  No JVD, no thyromegally Lymphatics:  No adenopathy Back:  No CVA tenderness Lungs:  Clear HEART:  Regular rate  rhythm, no murmurs, no rubs, no clicks Abd:  soft, positive bowel sounds, no organomegally, no rebound, no guarding Ext:  2 plus pulses, no edema, no cyanosis, no clubbing Skin:  No rashes no nodules Neuro:  CN II through XII intact, motor grossly intact  EKG - normal sinus rhythm with atrial pacing  DEVICE  Normal device function.  See PaceArt for details.   Assess/Plan:

## 2013-06-07 NOTE — Assessment & Plan Note (Signed)
Her device is working normally. We'll plan to recheck in several months. 

## 2013-06-07 NOTE — Assessment & Plan Note (Signed)
I've asked the patient to stop taking Cardizem. She will wean it off over 2 weeks. Hopefully her fatigue will improve.

## 2013-06-07 NOTE — Patient Instructions (Addendum)
Your physician wants you to follow-up in: May with Dr Court Joy will receive a reminder letter in the mail two months in advance. If you don't receive a letter, please call our office to schedule the follow-up appointment.   Your physician has recommended you make the following change in your medication:  1) Stop Diltiazem--take 1 tablet every other day for a week then stop

## 2013-06-27 DIAGNOSIS — I4891 Unspecified atrial fibrillation: Secondary | ICD-10-CM | POA: Diagnosis not present

## 2013-06-27 DIAGNOSIS — Z7901 Long term (current) use of anticoagulants: Secondary | ICD-10-CM | POA: Diagnosis not present

## 2013-06-28 DIAGNOSIS — H401490 Capsular glaucoma with pseudoexfoliation of lens, unspecified eye, stage unspecified: Secondary | ICD-10-CM | POA: Diagnosis not present

## 2013-06-28 DIAGNOSIS — H25019 Cortical age-related cataract, unspecified eye: Secondary | ICD-10-CM | POA: Diagnosis not present

## 2013-06-28 DIAGNOSIS — H409 Unspecified glaucoma: Secondary | ICD-10-CM | POA: Diagnosis not present

## 2013-07-03 ENCOUNTER — Ambulatory Visit: Payer: Self-pay | Admitting: Family Medicine

## 2013-07-11 DIAGNOSIS — I4891 Unspecified atrial fibrillation: Secondary | ICD-10-CM | POA: Diagnosis not present

## 2013-07-11 DIAGNOSIS — I498 Other specified cardiac arrhythmias: Secondary | ICD-10-CM | POA: Diagnosis not present

## 2013-07-11 DIAGNOSIS — I4949 Other premature depolarization: Secondary | ICD-10-CM | POA: Diagnosis not present

## 2013-07-11 DIAGNOSIS — Z950 Presence of cardiac pacemaker: Secondary | ICD-10-CM | POA: Diagnosis not present

## 2013-07-11 DIAGNOSIS — I10 Essential (primary) hypertension: Secondary | ICD-10-CM | POA: Diagnosis not present

## 2013-07-11 DIAGNOSIS — Z79899 Other long term (current) drug therapy: Secondary | ICD-10-CM | POA: Diagnosis not present

## 2013-07-11 DIAGNOSIS — Z7901 Long term (current) use of anticoagulants: Secondary | ICD-10-CM | POA: Diagnosis not present

## 2013-07-18 DIAGNOSIS — Z23 Encounter for immunization: Secondary | ICD-10-CM | POA: Diagnosis not present

## 2013-07-18 DIAGNOSIS — R5381 Other malaise: Secondary | ICD-10-CM | POA: Diagnosis not present

## 2013-07-18 DIAGNOSIS — F329 Major depressive disorder, single episode, unspecified: Secondary | ICD-10-CM | POA: Diagnosis not present

## 2013-07-18 DIAGNOSIS — Z1331 Encounter for screening for depression: Secondary | ICD-10-CM | POA: Diagnosis not present

## 2013-07-18 DIAGNOSIS — K219 Gastro-esophageal reflux disease without esophagitis: Secondary | ICD-10-CM | POA: Diagnosis not present

## 2013-07-18 DIAGNOSIS — F411 Generalized anxiety disorder: Secondary | ICD-10-CM | POA: Diagnosis not present

## 2013-07-31 DIAGNOSIS — Z85828 Personal history of other malignant neoplasm of skin: Secondary | ICD-10-CM | POA: Diagnosis not present

## 2013-07-31 DIAGNOSIS — L821 Other seborrheic keratosis: Secondary | ICD-10-CM | POA: Diagnosis not present

## 2013-07-31 DIAGNOSIS — L570 Actinic keratosis: Secondary | ICD-10-CM | POA: Diagnosis not present

## 2013-08-02 DIAGNOSIS — K5902 Outlet dysfunction constipation: Secondary | ICD-10-CM | POA: Diagnosis not present

## 2013-08-02 DIAGNOSIS — Z86010 Personal history of colonic polyps: Secondary | ICD-10-CM | POA: Diagnosis not present

## 2013-08-02 DIAGNOSIS — K625 Hemorrhage of anus and rectum: Secondary | ICD-10-CM | POA: Diagnosis not present

## 2013-08-08 ENCOUNTER — Ambulatory Visit (INDEPENDENT_AMBULATORY_CARE_PROVIDER_SITE_OTHER): Payer: Medicare Other | Admitting: Pharmacist

## 2013-08-08 DIAGNOSIS — I4891 Unspecified atrial fibrillation: Secondary | ICD-10-CM | POA: Diagnosis not present

## 2013-09-13 ENCOUNTER — Ambulatory Visit (INDEPENDENT_AMBULATORY_CARE_PROVIDER_SITE_OTHER): Payer: Medicare Other | Admitting: *Deleted

## 2013-09-13 DIAGNOSIS — I4891 Unspecified atrial fibrillation: Secondary | ICD-10-CM | POA: Diagnosis not present

## 2013-10-24 ENCOUNTER — Ambulatory Visit (INDEPENDENT_AMBULATORY_CARE_PROVIDER_SITE_OTHER): Payer: Medicare Other | Admitting: Pharmacist

## 2013-10-24 DIAGNOSIS — I4891 Unspecified atrial fibrillation: Secondary | ICD-10-CM

## 2013-10-24 MED ORDER — WARFARIN SODIUM 5 MG PO TABS: ORAL_TABLET | ORAL | Status: DC

## 2013-10-31 DIAGNOSIS — H409 Unspecified glaucoma: Secondary | ICD-10-CM | POA: Diagnosis not present

## 2013-10-31 DIAGNOSIS — H2510 Age-related nuclear cataract, unspecified eye: Secondary | ICD-10-CM | POA: Diagnosis not present

## 2013-10-31 DIAGNOSIS — H401490 Capsular glaucoma with pseudoexfoliation of lens, unspecified eye, stage unspecified: Secondary | ICD-10-CM | POA: Diagnosis not present

## 2013-11-03 ENCOUNTER — Other Ambulatory Visit: Payer: Self-pay | Admitting: Cardiology

## 2013-11-22 ENCOUNTER — Telehealth: Payer: Self-pay | Admitting: Cardiology

## 2013-11-22 NOTE — Telephone Encounter (Signed)
Spoke with pt.  Would like to reschedule her appt because she will not change from brand Coumadin to generic warfarin until tomorrow.  Have changed her appt from 2/2 to 2/9 to give her adequate time on the generic to see if she will require any dosing change.

## 2013-11-22 NOTE — Telephone Encounter (Signed)
Attempted to return call. Left message for patient to call back.  

## 2013-11-22 NOTE — Telephone Encounter (Signed)
New problem   Pt need to speak to [place].

## 2013-11-23 DIAGNOSIS — H2510 Age-related nuclear cataract, unspecified eye: Secondary | ICD-10-CM | POA: Diagnosis not present

## 2013-11-23 DIAGNOSIS — H2589 Other age-related cataract: Secondary | ICD-10-CM | POA: Diagnosis not present

## 2013-11-23 DIAGNOSIS — H268 Other specified cataract: Secondary | ICD-10-CM | POA: Diagnosis not present

## 2013-12-04 ENCOUNTER — Ambulatory Visit (INDEPENDENT_AMBULATORY_CARE_PROVIDER_SITE_OTHER): Payer: Medicare Other | Admitting: Pharmacist

## 2013-12-04 DIAGNOSIS — I4891 Unspecified atrial fibrillation: Secondary | ICD-10-CM | POA: Diagnosis not present

## 2013-12-04 LAB — POCT INR: INR: 3.1

## 2013-12-25 DIAGNOSIS — H353290 Exudative age-related macular degeneration, unspecified eye, stage unspecified: Secondary | ICD-10-CM | POA: Diagnosis not present

## 2014-01-01 ENCOUNTER — Encounter: Payer: Self-pay | Admitting: General Surgery

## 2014-01-01 ENCOUNTER — Encounter: Payer: Self-pay | Admitting: Cardiology

## 2014-01-01 ENCOUNTER — Ambulatory Visit (INDEPENDENT_AMBULATORY_CARE_PROVIDER_SITE_OTHER): Payer: Medicare Other | Admitting: Cardiology

## 2014-01-01 ENCOUNTER — Ambulatory Visit (INDEPENDENT_AMBULATORY_CARE_PROVIDER_SITE_OTHER): Payer: Medicare Other | Admitting: Pharmacist

## 2014-01-01 VITALS — BP 140/92 | HR 66 | Ht 65.0 in | Wt 174.0 lb

## 2014-01-01 DIAGNOSIS — I10 Essential (primary) hypertension: Secondary | ICD-10-CM

## 2014-01-01 DIAGNOSIS — I4891 Unspecified atrial fibrillation: Secondary | ICD-10-CM | POA: Diagnosis not present

## 2014-01-01 DIAGNOSIS — Z950 Presence of cardiac pacemaker: Secondary | ICD-10-CM

## 2014-01-01 DIAGNOSIS — I495 Sick sinus syndrome: Secondary | ICD-10-CM

## 2014-01-01 DIAGNOSIS — I4949 Other premature depolarization: Secondary | ICD-10-CM

## 2014-01-01 DIAGNOSIS — I493 Ventricular premature depolarization: Secondary | ICD-10-CM

## 2014-01-01 LAB — POCT INR: INR: 3.2

## 2014-01-01 NOTE — Progress Notes (Signed)
[place], [address], [place]  [id number] Phone: [phone] Fax:  [phone]  Date:  01/01/2014   ID:  Molly [name], DOB [birth year], MRN [id number]  PCP:  Gara Kroner, MD  Cardiologist:  Fransico Him, MD     History of Present Illness: Molly [name] is a 80 y.o. female with a history of paroxysmal atrial fibrillation on chronic systemic anticoagulation, HTN, symptomatic PVC's, low normal LVF EF 50-55%, tachybrady syndrome s/p PPM who presents today for followup.  She is doing well.  She denies any chest pain, SOB, DOE, LE edema, dizziness, palpitations or syncope.    Wt Readings from Last 3 Encounters:  01/01/14 174 lb (78.926 kg)  06/07/13 178 lb 12.8 oz (81.103 kg)  04/18/13 180 lb 6.4 oz (81.829 kg)     Past Medical History  Diagnosis Date  . Hypertension   . Atrial fibrillation     paroxysmal  . High cholesterol   . Hyperparathyroidism   . Shortness of breath   . GERD (gastroesophageal reflux disease)   . PAF (paroxysmal atrial fibrillation)   . H/O hyperkalemia     Resolved off ACE inhibitors  . Renal insufficiency     MILD  . LV dysfunction     MILD-EF40-45% 2003 now EF 50-55%  . H/O benign essential tremor     on amiodarone resolved on lower dose of amio  . PVC's (premature ventricular contractions)   . Tachycardia-bradycardia syndrome     s/p PPM 02/2013    Current Outpatient Prescriptions  Medication Sig Dispense Refill  . acetaminophen (TYLENOL) 500 MG tablet Take 1,000 mg by mouth every 6 (six) hours as needed. For pain      . atorvastatin (LIPITOR) 10 MG tablet TAKE 1 TABLET BY MOUTH ONCE DAILY  30 tablet  3  . furosemide (LASIX) 20 MG tablet Take 20 mg by mouth daily.      Marland Kitchen LORazepam (ATIVAN) 0.5 MG tablet Take 0.25 mg by mouth at bedtime.      . Multiple Vitamin (MULTIVITAMIN WITH MINERALS) TABS Take 1 tablet by mouth daily.      Marland Kitchen omeprazole (PRILOSEC) 20 MG capsule Take 20 mg by mouth daily.      . sotalol (BETAPACE) 80 MG  tablet Take 1 tablet (80 mg total) by mouth every 12 (twelve) hours.  60 tablet  11  . warfarin (COUMADIN) 5 MG tablet Take 1 tablet on all days except 1/2 tablet on Thursday and Sunday, or as directed by coumadin clinic.  30 tablet  5   No current facility-administered medications for this visit.    Allergies:    Allergies  Allergen Reactions  . Tramadol Nausea Only    Social History:  The patient  reports that she has never smoked. She has never used smokeless tobacco. She reports that she does not drink alcohol or use illicit drugs.   Family History:  The patient's family history includes Hypertension in her father and mother.   ROS:  Please see the history of present illness.      All other systems reviewed and negative.   PHYSICAL EXAM: VS:  BP 140/92  Pulse 66  Ht 5\' 5"  (1.651 m)  Wt 174 lb (78.926 kg)  BMI 28.96 kg/m2 Well nourished, well developed, in no acute distress HEENT: normal Neck: no JVD Cardiac:  normal S1, S2; RRR; no murmur Lungs:  clear to auscultation bilaterally, no wheezing, rhonchi or rales Abd: soft, nontender, no hepatomegaly  Ext: no edema Skin: warm and dry Neuro:  CNs 2-12 intact, no focal abnormalities noted  EKG:   NSR with LVH and QRS widening, LAFB, QTc 43msec  ASSESSMENT AND PLAN:  1. Paroxysmal atrial fibrillation  - continue Warfarin/sotolol 2. Chronic systemic anticoagulation 3. HTN - mildly elevated today but she complains of occasional weakness and fatigue around 11am when she gets a shower.  She my have some orthostatic hypotension.    - I have asked her to check her BP daily for a week 4. Symptomatic PVC's - resolved 5. Tachybrady syndrome s/p PPM  Followup with me in 6 months  Signed, Fransico Him, MD 01/01/2014 4:01 PM

## 2014-01-01 NOTE — Patient Instructions (Signed)
Your physician recommends that you continue on your current medications as directed. Please refer to the Current Medication list given to you today.  Your Physician requests that your Check your Blood Pressure Twice daily for a week and call us with the results  Your physician wants you to follow-up in: 6 Months with Dr Mallie Snooks will receive a reminder letter in the mail two months in advance. If you don't receive a letter, please call our office to schedule the follow-up appointment.

## 2014-01-02 DIAGNOSIS — F329 Major depressive disorder, single episode, unspecified: Secondary | ICD-10-CM | POA: Diagnosis not present

## 2014-01-02 DIAGNOSIS — I10 Essential (primary) hypertension: Secondary | ICD-10-CM | POA: Diagnosis not present

## 2014-01-02 DIAGNOSIS — F411 Generalized anxiety disorder: Secondary | ICD-10-CM | POA: Diagnosis not present

## 2014-01-02 DIAGNOSIS — E782 Mixed hyperlipidemia: Secondary | ICD-10-CM | POA: Diagnosis not present

## 2014-01-02 DIAGNOSIS — N183 Chronic kidney disease, stage 3 unspecified: Secondary | ICD-10-CM | POA: Diagnosis not present

## 2014-01-02 DIAGNOSIS — R609 Edema, unspecified: Secondary | ICD-10-CM | POA: Diagnosis not present

## 2014-01-02 DIAGNOSIS — I4891 Unspecified atrial fibrillation: Secondary | ICD-10-CM | POA: Diagnosis not present

## 2014-01-02 DIAGNOSIS — K219 Gastro-esophageal reflux disease without esophagitis: Secondary | ICD-10-CM | POA: Diagnosis not present

## 2014-01-29 ENCOUNTER — Ambulatory Visit (INDEPENDENT_AMBULATORY_CARE_PROVIDER_SITE_OTHER): Payer: Medicare Other | Admitting: Pharmacist

## 2014-01-29 DIAGNOSIS — I4891 Unspecified atrial fibrillation: Secondary | ICD-10-CM

## 2014-01-29 LAB — POCT INR: INR: 2.6

## 2014-02-05 DIAGNOSIS — Z85828 Personal history of other malignant neoplasm of skin: Secondary | ICD-10-CM | POA: Diagnosis not present

## 2014-02-05 DIAGNOSIS — L821 Other seborrheic keratosis: Secondary | ICD-10-CM | POA: Diagnosis not present

## 2014-02-05 DIAGNOSIS — L570 Actinic keratosis: Secondary | ICD-10-CM | POA: Diagnosis not present

## 2014-02-05 DIAGNOSIS — D239 Other benign neoplasm of skin, unspecified: Secondary | ICD-10-CM | POA: Diagnosis not present

## 2014-02-05 DIAGNOSIS — D485 Neoplasm of uncertain behavior of skin: Secondary | ICD-10-CM | POA: Diagnosis not present

## 2014-02-05 DIAGNOSIS — L820 Inflamed seborrheic keratosis: Secondary | ICD-10-CM | POA: Diagnosis not present

## 2014-03-07 ENCOUNTER — Encounter: Payer: Self-pay | Admitting: Internal Medicine

## 2014-03-07 ENCOUNTER — Ambulatory Visit (INDEPENDENT_AMBULATORY_CARE_PROVIDER_SITE_OTHER): Payer: Medicare Other | Admitting: *Deleted

## 2014-03-07 ENCOUNTER — Ambulatory Visit (INDEPENDENT_AMBULATORY_CARE_PROVIDER_SITE_OTHER): Payer: Medicare Other | Admitting: Internal Medicine

## 2014-03-07 VITALS — BP 126/70 | HR 72 | Ht 65.0 in | Wt 172.0 lb

## 2014-03-07 DIAGNOSIS — I4891 Unspecified atrial fibrillation: Secondary | ICD-10-CM

## 2014-03-07 DIAGNOSIS — I493 Ventricular premature depolarization: Secondary | ICD-10-CM

## 2014-03-07 DIAGNOSIS — I495 Sick sinus syndrome: Secondary | ICD-10-CM | POA: Diagnosis not present

## 2014-03-07 DIAGNOSIS — Z950 Presence of cardiac pacemaker: Secondary | ICD-10-CM | POA: Diagnosis not present

## 2014-03-07 DIAGNOSIS — I4949 Other premature depolarization: Secondary | ICD-10-CM

## 2014-03-07 LAB — POCT INR: INR: 2.3

## 2014-03-07 NOTE — Assessment & Plan Note (Signed)
She is maintaining NSR 98% of the time. She will continue her sotalol.

## 2014-03-07 NOTE — Progress Notes (Signed)
HPI Molly [name] returns today for followup. She is a very pleasant 80 year old woman with a history of frequent PVCs, and atrial fibrillation. The patient has been on multiple medications in attempt to control her arrhythmias.  She has been placed on sotalol.  She had fatigue with calcium channel blockers. In the interim she notes fatigue and weakness and palpitations are improved. She returns today for followup. No syncope, and no chest pain. She has minimal dyspnea with exertion and peripheral edema. Allergies  Allergen Reactions  . Tramadol Nausea Only     Current Outpatient Prescriptions  Medication Sig Dispense Refill  . acetaminophen (TYLENOL) 500 MG tablet Take 1,000 mg by mouth every 6 (six) hours as needed. For pain      . atorvastatin (LIPITOR) 10 MG tablet TAKE 1 TABLET BY MOUTH ONCE DAILY  30 tablet  3  . furosemide (LASIX) 20 MG tablet Take 20 mg by mouth daily.      Marland Kitchen LORazepam (ATIVAN) 0.5 MG tablet Take 0.25 mg by mouth at bedtime.      . Multiple Vitamin (MULTIVITAMIN WITH MINERALS) TABS Take 1 tablet by mouth daily.      Marland Kitchen omeprazole (PRILOSEC) 20 MG capsule Take 20 mg by mouth daily.      . sotalol (BETAPACE) 80 MG tablet Take 1 tablet (80 mg total) by mouth every 12 (twelve) hours.  60 tablet  11  . warfarin (COUMADIN) 5 MG tablet Take 1 tablet on all days except 1/2 tablet on Thursday and Sunday, or as directed by coumadin clinic.  30 tablet  5   No current facility-administered medications for this visit.     Past Medical History  Diagnosis Date  . Hypertension   . Atrial fibrillation     paroxysmal  . High cholesterol   . Hyperparathyroidism   . Shortness of breath   . GERD (gastroesophageal reflux disease)   . PAF (paroxysmal atrial fibrillation)   . H/O hyperkalemia     Resolved off ACE inhibitors  . Renal insufficiency     MILD  . LV dysfunction     MILD-EF40-45% 2003 now EF 50-55%  . H/O benign essential tremor     on amiodarone resolved on lower  dose of amio  . PVC's (premature ventricular contractions)   . Tachycardia-bradycardia syndrome     s/p PPM 02/2013    ROS:   All systems reviewed and negative except as noted in the HPI.   Past Surgical History  Procedure Laterality Date  . Shoulder surgery    . Cardiac catheterization  09/25/2000    EF of 50% -- with normal left ventricular size and function  . Cesarean section    . Cesarean section    . Tonsillectomy    . Shoulder arthroscopy w/ rotator cuff repair    . Bunionectomy      Right  . Varicose vein surgery    . Foot surgery    . Insert / replace / remove pacemaker  02/2013  . Pacemaker insertion  02/2013     Family History  Problem Relation Age of Onset  . Hypertension Mother   . Hypertension Father      History   Social History  . Marital Status: Married    Spouse Name: N/A    Number of Children: N/A  . Years of Education: N/A   Occupational History  . Not on file.   Social History Main Topics  . Smoking status: Never Smoker   .  Smokeless tobacco: Never Used  . Alcohol Use: No  . Drug Use: No  . Sexual Activity: Not on file   Other Topics Concern  . Not on file   Social History Narrative  . No narrative on file     BP 126/70  Pulse 72  Ht 5\' 5"  (1.651 m)  Wt 172 lb (78.019 kg)  BMI 28.62 kg/m2  Physical Exam:  Well appearing 80 year old woman,NAD HEENT: Unremarkable Neck:  No JVD, no thyromegally Back:  No CVA tenderness Lungs:  Clear HEART:  Regular rate rhythm, no murmurs, no rubs, no clicks Abd:  soft, positive bowel sounds, no organomegally, no rebound, no guarding Ext:  2 plus pulses, no edema, no cyanosis, no clubbing Skin:  No rashes no nodules Neuro:  CN II through XII intact, motor grossly intact  EKG - normal sinus rhythm with atrial pacing  DEVICE  Normal device function.  See PaceArt for details.   Assess/Plan:

## 2014-03-07 NOTE — Assessment & Plan Note (Signed)
Her [employer] PPM is working normally except for some mild undersensing of atrial fib. We have reprogrammed her device.

## 2014-03-07 NOTE — Assessment & Plan Note (Signed)
These appear to be well controlled. Continue beta blocker.

## 2014-03-07 NOTE — Patient Instructions (Addendum)
Your physician wants you to follow-up in: 6 months in the device clinic and 12 months with Dr Knox Saliva will receive a reminder letter in the mail two months in advance. If you don't receive a letter, please call our office to schedule the follow-up appointment.

## 2014-03-08 ENCOUNTER — Other Ambulatory Visit: Payer: Self-pay

## 2014-03-08 LAB — MDC_IDC_ENUM_SESS_TYPE_INCLINIC
Battery Remaining Longevity: 150 mo
Battery Remaining Percentage: 100 %
Brady Statistic RA Percent Paced: 88 %
Date Time Interrogation Session: 20150513193800
Lead Channel Impedance Value: 667 Ohm
Lead Channel Pacing Threshold Pulse Width: 0.4 ms
Lead Channel Setting Pacing Amplitude: 2.4 V
MDC IDC MSMT LEADCHNL RA PACING THRESHOLD AMPLITUDE: 0.8 V
MDC IDC MSMT LEADCHNL RA PACING THRESHOLD PULSEWIDTH: 0.4 ms
MDC IDC MSMT LEADCHNL RA SENSING INTR AMPL: 2.3 mV
MDC IDC MSMT LEADCHNL RV IMPEDANCE VALUE: 709 Ohm
MDC IDC MSMT LEADCHNL RV PACING THRESHOLD AMPLITUDE: 0.7 V
MDC IDC MSMT LEADCHNL RV SENSING INTR AMPL: 16.7 mV
MDC IDC PG SERIAL: 112392
MDC IDC SET LEADCHNL RA PACING AMPLITUDE: 2 V
MDC IDC SET LEADCHNL RV PACING PULSEWIDTH: 0.4 ms
MDC IDC SET LEADCHNL RV SENSING SENSITIVITY: 2.5 mV
MDC IDC STAT BRADY RV PERCENT PACED: 20 %
Zone Setting Detection Interval: 375 ms

## 2014-03-08 MED ORDER — SOTALOL HCL 80 MG PO TABS: 80.0000 mg | ORAL_TABLET | Freq: Two times a day (BID) | ORAL | Status: DC

## 2014-04-16 ENCOUNTER — Ambulatory Visit (INDEPENDENT_AMBULATORY_CARE_PROVIDER_SITE_OTHER): Payer: Medicare Other

## 2014-04-16 DIAGNOSIS — I4891 Unspecified atrial fibrillation: Secondary | ICD-10-CM

## 2014-04-16 LAB — POCT INR: INR: 2.5

## 2014-05-29 ENCOUNTER — Ambulatory Visit (INDEPENDENT_AMBULATORY_CARE_PROVIDER_SITE_OTHER): Payer: Medicare Other | Admitting: *Deleted

## 2014-05-29 DIAGNOSIS — I4891 Unspecified atrial fibrillation: Secondary | ICD-10-CM | POA: Diagnosis not present

## 2014-05-29 LAB — POCT INR: INR: 2

## 2014-06-08 ENCOUNTER — Emergency Department (HOSPITAL_COMMUNITY): Payer: Medicare Other

## 2014-06-08 ENCOUNTER — Emergency Department (HOSPITAL_COMMUNITY)
Admission: EM | Admit: 2014-06-08 | Discharge: 2014-06-08 | Disposition: A | Discharge status: Home / Self Care | Payer: Medicare Other | Attending: Emergency Medicine | Admitting: Emergency Medicine

## 2014-06-08 ENCOUNTER — Encounter (HOSPITAL_COMMUNITY): Payer: Self-pay | Admitting: Emergency Medicine

## 2014-06-08 DIAGNOSIS — I4891 Unspecified atrial fibrillation: Secondary | ICD-10-CM | POA: Insufficient documentation

## 2014-06-08 DIAGNOSIS — I4949 Other premature depolarization: Secondary | ICD-10-CM | POA: Insufficient documentation

## 2014-06-08 DIAGNOSIS — M25539 Pain in unspecified wrist: Secondary | ICD-10-CM | POA: Diagnosis not present

## 2014-06-08 DIAGNOSIS — E7800 Pure hypercholesterolemia, unspecified: Secondary | ICD-10-CM | POA: Diagnosis not present

## 2014-06-08 DIAGNOSIS — I10 Essential (primary) hypertension: Secondary | ICD-10-CM | POA: Insufficient documentation

## 2014-06-08 DIAGNOSIS — W010XXA Fall on same level from slipping, tripping and stumbling without subsequent striking against object, initial encounter: Secondary | ICD-10-CM | POA: Diagnosis not present

## 2014-06-08 DIAGNOSIS — Z862 Personal history of diseases of the blood and blood-forming organs and certain disorders involving the immune mechanism: Secondary | ICD-10-CM | POA: Insufficient documentation

## 2014-06-08 DIAGNOSIS — W07XXXA Fall from chair, initial encounter: Secondary | ICD-10-CM | POA: Insufficient documentation

## 2014-06-08 DIAGNOSIS — M542 Cervicalgia: Secondary | ICD-10-CM | POA: Diagnosis not present

## 2014-06-08 DIAGNOSIS — S59919A Unspecified injury of unspecified forearm, initial encounter: Secondary | ICD-10-CM | POA: Diagnosis not present

## 2014-06-08 DIAGNOSIS — Z950 Presence of cardiac pacemaker: Secondary | ICD-10-CM | POA: Diagnosis not present

## 2014-06-08 DIAGNOSIS — K219 Gastro-esophageal reflux disease without esophagitis: Secondary | ICD-10-CM | POA: Insufficient documentation

## 2014-06-08 DIAGNOSIS — Y929 Unspecified place or not applicable: Secondary | ICD-10-CM | POA: Insufficient documentation

## 2014-06-08 DIAGNOSIS — Z79899 Other long term (current) drug therapy: Secondary | ICD-10-CM | POA: Diagnosis not present

## 2014-06-08 DIAGNOSIS — Z87448 Personal history of other diseases of urinary system: Secondary | ICD-10-CM | POA: Insufficient documentation

## 2014-06-08 DIAGNOSIS — M25531 Pain in right wrist: Secondary | ICD-10-CM

## 2014-06-08 DIAGNOSIS — S298XXA Other specified injuries of thorax, initial encounter: Secondary | ICD-10-CM | POA: Diagnosis not present

## 2014-06-08 DIAGNOSIS — I495 Sick sinus syndrome: Secondary | ICD-10-CM | POA: Insufficient documentation

## 2014-06-08 DIAGNOSIS — S199XXA Unspecified injury of neck, initial encounter: Secondary | ICD-10-CM | POA: Diagnosis not present

## 2014-06-08 DIAGNOSIS — S6990XA Unspecified injury of unspecified wrist, hand and finger(s), initial encounter: Secondary | ICD-10-CM | POA: Diagnosis not present

## 2014-06-08 DIAGNOSIS — Z8639 Personal history of other endocrine, nutritional and metabolic disease: Secondary | ICD-10-CM | POA: Insufficient documentation

## 2014-06-08 DIAGNOSIS — R51 Headache: Secondary | ICD-10-CM | POA: Diagnosis not present

## 2014-06-08 DIAGNOSIS — S59909A Unspecified injury of unspecified elbow, initial encounter: Secondary | ICD-10-CM | POA: Insufficient documentation

## 2014-06-08 DIAGNOSIS — Y9389 Activity, other specified: Secondary | ICD-10-CM | POA: Insufficient documentation

## 2014-06-08 DIAGNOSIS — Z9889 Other specified postprocedural states: Secondary | ICD-10-CM | POA: Insufficient documentation

## 2014-06-08 DIAGNOSIS — Z7901 Long term (current) use of anticoagulants: Secondary | ICD-10-CM | POA: Diagnosis not present

## 2014-06-08 DIAGNOSIS — S0993XA Unspecified injury of face, initial encounter: Secondary | ICD-10-CM | POA: Diagnosis not present

## 2014-06-08 DIAGNOSIS — S0990XA Unspecified injury of head, initial encounter: Secondary | ICD-10-CM | POA: Insufficient documentation

## 2014-06-08 DIAGNOSIS — W19XXXA Unspecified fall, initial encounter: Secondary | ICD-10-CM

## 2014-06-08 DIAGNOSIS — M19049 Primary osteoarthritis, unspecified hand: Secondary | ICD-10-CM | POA: Diagnosis not present

## 2014-06-08 DIAGNOSIS — M11249 Other chondrocalcinosis, unspecified hand: Secondary | ICD-10-CM | POA: Diagnosis not present

## 2014-06-08 LAB — CBC WITH DIFFERENTIAL/PLATELET
BASOS ABS: 0 10*3/uL (ref 0.0–0.1)
Basophils Relative: 0 % (ref 0–1)
EOS ABS: 0.4 10*3/uL (ref 0.0–0.7)
EOS PCT: 5 % (ref 0–5)
HCT: 40 % (ref 36.0–46.0)
Hemoglobin: 13 g/dL (ref 12.0–15.0)
LYMPHS ABS: 1.9 10*3/uL (ref 0.7–4.0)
LYMPHS PCT: 24 % (ref 12–46)
MCH: 32.9 pg (ref 26.0–34.0)
MCHC: 32.5 g/dL (ref 30.0–36.0)
MCV: 101.3 fL — AB (ref 78.0–100.0)
Monocytes Absolute: 0.7 10*3/uL (ref 0.1–1.0)
Monocytes Relative: 9 % (ref 3–12)
NEUTROS PCT: 62 % (ref 43–77)
Neutro Abs: 5 10*3/uL (ref 1.7–7.7)
PLATELETS: 219 10*3/uL (ref 150–400)
RBC: 3.95 MIL/uL (ref 3.87–5.11)
RDW: 13.4 % (ref 11.5–15.5)
WBC: 8 10*3/uL (ref 4.0–10.5)

## 2014-06-08 LAB — URINALYSIS, ROUTINE W REFLEX MICROSCOPIC
Bilirubin Urine: NEGATIVE
GLUCOSE, UA: NEGATIVE mg/dL
HGB URINE DIPSTICK: NEGATIVE
Ketones, ur: NEGATIVE mg/dL
Nitrite: NEGATIVE
PH: 6.5 (ref 5.0–8.0)
Protein, ur: NEGATIVE mg/dL
Specific Gravity, Urine: 1.007 (ref 1.005–1.030)
Urobilinogen, UA: 0.2 mg/dL (ref 0.0–1.0)

## 2014-06-08 LAB — BASIC METABOLIC PANEL
ANION GAP: 12 (ref 5–15)
BUN: 21 mg/dL (ref 6–23)
CALCIUM: 10.9 mg/dL — AB (ref 8.4–10.5)
CO2: 25 mEq/L (ref 19–32)
Chloride: 104 mEq/L (ref 96–112)
Creatinine, Ser: 0.93 mg/dL (ref 0.50–1.10)
GFR calc Af Amer: 66 mL/min — ABNORMAL LOW (ref >90)
GFR, EST NON AFRICAN AMERICAN: 57 mL/min — AB (ref >90)
GLUCOSE: 109 mg/dL — AB (ref 70–99)
POTASSIUM: 4.2 meq/L (ref 3.7–5.3)
SODIUM: 141 meq/L (ref 137–147)

## 2014-06-08 LAB — PROTIME-INR
INR: 1.97 — AB (ref 0.00–1.49)
PROTHROMBIN TIME: 22.4 s — AB (ref 11.6–15.2)

## 2014-06-08 LAB — URINE MICROSCOPIC-ADD ON

## 2014-06-08 MED ORDER — ACETAMINOPHEN 325 MG PO TABS
650.0000 mg | ORAL_TABLET | Freq: Once | ORAL | Status: AC
  Administered 2014-06-08: 650 mg via ORAL
  Filled 2014-06-08: qty 2

## 2014-06-08 NOTE — ED Notes (Signed)
Pt reports tripping out of chair at salon today and fell hitting her head on the floor. No LOC. No bleeding noted. Pt is on coumadin for AFib. Pt also reports right wrist pain from trying to keep from falling.

## 2014-06-08 NOTE — ED Provider Notes (Signed)
CSN: [id number]     Arrival date & time 06/08/14  1455 History   First MD Initiated Contact with Patient 06/08/14 1542     Chief Complaint  Patient presents with  . Head Injury  . Wrist Pain     (Consider location/radiation/quality/duration/timing/severity/associated sxs/prior Treatment) HPI Comments: Patient is a 80 year old female with history of atrial fibrillation, hypertension, high cholesterol, hyperparathyroidism who presents to the emergency department after a fall earlier today. She states that she was at the beauty shop when she got up from the chair. She was feeling well and believes she tripped over something. She denies any lightheadedness, dizziness, chest pain, shortness of breath prior to the fall. She fell backwards and landed on her head and right wrist. She denies any loss of consciousness. She currently has a headache which she  describes as a "soreness". She was feeling well enough after the fall to drive herself home. After arriving home she was encouraged to come to the emergency department. She last checked her INR a week and a half ago. At that time her INR was 2.0.   Patient is a 80 y.o. female presenting with head injury and wrist pain. The history is provided by the patient. No language interpreter was used.  Head Injury Associated symptoms: headache   Associated symptoms: no numbness   Wrist Pain Associated symptoms include headaches and myalgias. Pertinent negatives include no chest pain, chills, fever, numbness or weakness.    Past Medical History  Diagnosis Date  . Hypertension   . Atrial fibrillation     paroxysmal  . High cholesterol   . Hyperparathyroidism   . Shortness of breath   . GERD (gastroesophageal reflux disease)   . PAF (paroxysmal atrial fibrillation)   . H/O hyperkalemia     Resolved off ACE inhibitors  . Renal insufficiency     MILD  . LV dysfunction     MILD-EF40-45% 2003 now EF 50-55%  . H/O benign essential tremor     on  amiodarone resolved on lower dose of amio  . PVC's (premature ventricular contractions)   . Tachycardia-bradycardia syndrome     s/p PPM 02/2013   Past Surgical History  Procedure Laterality Date  . Shoulder surgery    . Cardiac catheterization  09/25/2000    EF of 50% -- with normal left ventricular size and function  . Cesarean section    . Cesarean section    . Tonsillectomy    . Shoulder arthroscopy w/ rotator cuff repair    . Bunionectomy      Right  . Varicose vein surgery    . Foot surgery    . Insert / replace / remove pacemaker  02/2013  . Pacemaker insertion  02/2013   Family History  Problem Relation Age of Onset  . Hypertension Mother   . Hypertension Father    History  Substance Use Topics  . Smoking status: Never Smoker   . Smokeless tobacco: Never Used  . Alcohol Use: No   OB History   Grav Para Term Preterm Abortions TAB SAB Ect Mult Living                 Review of Systems  Constitutional: Negative for fever and chills.  Respiratory: Negative for shortness of breath.   Cardiovascular: Negative for chest pain.  Musculoskeletal: Positive for myalgias.  Neurological: Positive for headaches. Negative for dizziness, syncope, weakness and numbness.  All other systems reviewed and are negative.  Allergies  Tramadol  Home Medications   Prior to Admission medications   Medication Sig Start Date End Date Taking? Authorizing Provider  acetaminophen (TYLENOL) 500 MG tablet Take 1,000 mg by mouth every 6 (six) hours as needed. For pain   Yes Historical Provider, MD  atorvastatin (LIPITOR) 10 MG tablet Take 10 mg by mouth every evening.   Yes Historical Provider, MD  furosemide (LASIX) 20 MG tablet Take 20 mg by mouth daily.   Yes Historical Provider, MD  LORazepam (ATIVAN) 0.5 MG tablet Take 0.25 mg by mouth at bedtime.   Yes Historical Provider, MD  Multiple Vitamin (MULTIVITAMIN WITH MINERALS) TABS Take 1 tablet by mouth daily.   Yes Historical  Provider, MD  omeprazole (PRILOSEC) 20 MG capsule Take 20 mg by mouth daily.   Yes Historical Provider, MD  sotalol (BETAPACE) 80 MG tablet Take 1 tablet (80 mg total) by mouth every 12 (twelve) hours. 03/08/14  Yes Sueanne Margarita, MD  warfarin (COUMADIN) 5 MG tablet Take 1 tablet on all days except 1/2 tablet on Thursday and Sunday, or as directed by coumadin clinic. 10/24/13  Yes Sueanne Margarita, MD   BP 152/82  Pulse 76  Temp(Src) 98.2 F (36.8 C) (Oral)  Resp 8  SpO2 99% Physical Exam  Nursing note and vitals reviewed. Constitutional: She is oriented to person, place, and time. She appears well-developed and well-nourished. No distress.  HENT:  Head: Normocephalic and atraumatic.  Right Ear: External ear normal.  Left Ear: External ear normal.  Nose: Nose normal.  Mouth/Throat: Uvula is midline and oropharynx is clear and moist.  No broken or loose teeth  Eyes: Conjunctivae and EOM are normal. Pupils are equal, round, and reactive to light.  Neck: Normal range of motion. No spinous process tenderness and no muscular tenderness present.  Cardiovascular: Normal rate, regular rhythm, normal heart sounds, intact distal pulses and normal pulses.   Pulses:      Radial pulses are 2+ on the right side, and 2+ on the left side.       Posterior tibial pulses are 2+ on the right side, and 2+ on the left side.  Pulmonary/Chest: Effort normal and breath sounds normal. No stridor. No respiratory distress. She has no wheezes. She has no rales.  Abdominal: Soft. She exhibits no distension.  Musculoskeletal: Normal range of motion.  Tenderness to palpation over right thenar eminence. No anatomical snuff box tenderness.   Neurological: She is alert and oriented to person, place, and time. She has normal strength. No sensory deficit. Coordination and gait normal. GCS eye subscore is 4. GCS verbal subscore is 5. GCS motor subscore is 6.  Finger nose finger normal. Rapid alternating movements normal   Grip strength 5/5 bilaterally  Skin: Skin is warm and dry. She is not diaphoretic. No erythema.  Psychiatric: She has a normal mood and affect. Her behavior is normal.    ED Course  Procedures (including critical care time) Labs Review Labs Reviewed  CBC WITH DIFFERENTIAL - Abnormal; Notable for the following:    MCV 101.3 (*)    All other components within normal limits  BASIC METABOLIC PANEL - Abnormal; Notable for the following:    Glucose, Bld 109 (*)    Calcium 10.9 (*)    GFR calc non Af Amer 57 (*)    GFR calc Af Amer 66 (*)    All other components within normal limits  PROTIME-INR - Abnormal; Notable for the following:  Prothrombin Time 22.4 (*)    INR 1.97 (*)    All other components within normal limits  URINALYSIS, ROUTINE W REFLEX MICROSCOPIC - Abnormal; Notable for the following:    Leukocytes, UA SMALL (*)    All other components within normal limits  URINE MICROSCOPIC-ADD ON - Abnormal; Notable for the following:    Squamous Epithelial / LPF FEW (*)    Bacteria, UA FEW (*)    All other components within normal limits  URINE CULTURE    Imaging Review Dg Wrist Complete Right  06/08/2014   CLINICAL DATA:  Pain post fall  EXAM: RIGHT WRIST - COMPLETE 3+ VIEW  COMPARISON:  None  FINDINGS: Four views of the right wrist submitted. There is diffuse osteopenia. No acute fracture or subluxation. Degenerative changes are noted at the articulation of navicular with trapezium. Degenerative changes first carpometacarpal joint. Chondrocalcinosis. Narrowing of radiocarpal joint space.  IMPRESSION: No acute fracture or subluxation. Diffuse osteopenia. Degenerative changes as described above. Chondrocalcinosis.   Electronically Signed   By: Lahoma Crocker M.D.   On: 06/08/2014 17:02   Ct Head Wo Contrast  06/08/2014   CLINICAL DATA:  Status post fall now with headache and neck pain; on anticoagulant its  EXAM: CT HEAD WITHOUT CONTRAST  CT CERVICAL SPINE WITHOUT CONTRAST  TECHNIQUE:  Multidetector CT imaging of the head and cervical spine was performed following the standard protocol without intravenous contrast. Multiplanar CT image reconstructions of the cervical spine were also generated.  COMPARISON:  Noncontrast CT scan of the brain of July 28, 2007  FINDINGS: CT HEAD FINDINGS  There is mild age appropriate diffuse cerebral and cerebellar atrophy with compensatory ventriculomegaly. There is no acute intracranial hemorrhage nor evidence of acute ischemic change. There are minimal white matter hypodensities consistent with chronic small vessel ischemic change. The cerebellum and brainstem exhibit no acute abnormalities.  The observed paranasal sinuses and mastoid air cells are clear. There is no acute skull fracture. There is a small cephalohematoma over the high right posterior parietal bone.  CT CERVICAL SPINE FINDINGS  The cervical vertebral bodies are preserved in height. There is mild reversal of the normal cervical lordosis centered at the C4-5 level. There is 2 mm of anterolisthesis of C4 with respect to C5 that is likely chronic. There is degenerative change at all cervical disc levels with the exception of C2-3. The prevertebral soft tissue spaces are normal. There is no perched facet. There is no evidence of a spinous process or facet fracture. The odontoid is intact. There are degenerative changes of the atlanto dens articulation. The observed portions of the first and second ribs are normal.  IMPRESSION: 1. There is no acute intracranial hemorrhage nor other acute intracranial abnormality. There are age related atrophic and white matter density changes. 2. There is no acute skull fracture. 3. There is no acute cervical spine fracture nor dislocation. Mild reversal of the normal cervical lordosis centered at C4-5 may be chronic or related to muscle spasm. Degenerative disc change is present at multiple cervical levels and is most conspicuous at C5-6 and C6-7.   Electronically  Signed   By: David  Martinique   On: 06/08/2014 16:54   Ct Cervical Spine Wo Contrast  06/08/2014   CLINICAL DATA:  Status post fall now with headache and neck pain; on anticoagulant its  EXAM: CT HEAD WITHOUT CONTRAST  CT CERVICAL SPINE WITHOUT CONTRAST  TECHNIQUE: Multidetector CT imaging of the head and cervical spine was performed following the  standard protocol without intravenous contrast. Multiplanar CT image reconstructions of the cervical spine were also generated.  COMPARISON:  Noncontrast CT scan of the brain of July 28, 2007  FINDINGS: CT HEAD FINDINGS  There is mild age appropriate diffuse cerebral and cerebellar atrophy with compensatory ventriculomegaly. There is no acute intracranial hemorrhage nor evidence of acute ischemic change. There are minimal white matter hypodensities consistent with chronic small vessel ischemic change. The cerebellum and brainstem exhibit no acute abnormalities.  The observed paranasal sinuses and mastoid air cells are clear. There is no acute skull fracture. There is a small cephalohematoma over the high right posterior parietal bone.  CT CERVICAL SPINE FINDINGS  The cervical vertebral bodies are preserved in height. There is mild reversal of the normal cervical lordosis centered at the C4-5 level. There is 2 mm of anterolisthesis of C4 with respect to C5 that is likely chronic. There is degenerative change at all cervical disc levels with the exception of C2-3. The prevertebral soft tissue spaces are normal. There is no perched facet. There is no evidence of a spinous process or facet fracture. The odontoid is intact. There are degenerative changes of the atlanto dens articulation. The observed portions of the first and second ribs are normal.  IMPRESSION: 1. There is no acute intracranial hemorrhage nor other acute intracranial abnormality. There are age related atrophic and white matter density changes. 2. There is no acute skull fracture. 3. There is no acute  cervical spine fracture nor dislocation. Mild reversal of the normal cervical lordosis centered at C4-5 may be chronic or related to muscle spasm. Degenerative disc change is present at multiple cervical levels and is most conspicuous at C5-6 and C6-7.   Electronically Signed   By: David  Martinique   On: 06/08/2014 16:54   Dg Hand Complete Right  06/08/2014   CLINICAL DATA:  Pain along the radial side  EXAM: RIGHT HAND - COMPLETE 3+ VIEW  COMPARISON:  None.  FINDINGS: No fracture distal radius ulna. There is calcification of the triangular fibrocartilage along the distal ulna. Without carpal bone fracture. There is narrowing of the first carpal metacarpal joint. All  IMPRESSION: 1. No acute findings of right hand. 2. Chondrocalcinosis of the triangular fibrocartilage could represent calcium pyrophosphate dihydrate deposition disease. 3. Osteoarthritis of the first carpometacarpal   Electronically Signed   By: Suzy Bouchard M.D.   On: 06/08/2014 17:02     EKG Interpretation   Date/Time:  Friday June 08 2014 16:06:38 EDT Ventricular Rate:  71 PR Interval:  147 QRS Duration: 137 QT Interval:  432 QTC Calculation: 469 R Axis:   -53 Text Interpretation:  Atrial-paced rhythm Left bundle branch block ED  PHYSICIAN INTERPRETATION AVAILABLE IN [facility] Confirmed by TEST,  Record ([id number]) on 06/10/2014 7:19:41 AM      MDM   Final diagnoses:  Fall, initial encounter  Right wrist pain    Patient presents to ED after a mechanical fall. She is on Coumadin. INR is 1.97. Head and cervical spine CT show intracranial pathology or any cervical spine fracture. Patient's XRs show no acute pathology. Discussed results with patient. She feels improved after tylenol. Discussed reasons to return to ED immediately. Vital signs stable for discharge. Dr. Tomi Bamberger evaluated patient and agrees with plan. Patient / Family / Caregiver informed of clinical course, understand medical decision-making process, and  agree with plan.   Elwyn Lade, PA-C 06/10/14 [phone]

## 2014-06-08 NOTE — Discharge Instructions (Signed)
Fall Prevention and Home Safety Falls cause injuries and can affect all age groups. It is possible to prevent falls.  HOW TO PREVENT FALLS  Wear shoes with rubber soles that do not have an opening for your toes.  Keep the inside and outside of your house well lit.  Use night lights throughout your home.  Remove clutter from floors.  Clean up floor spills.  Remove throw rugs or fasten them to the floor with carpet tape.  Do not place electrical cords across pathways.  Put grab bars by your tub, shower, and toilet. Do not use towel bars as grab bars.  Put handrails on both sides of the stairway. Fix loose handrails.  Do not climb on stools or stepladders, if possible.  Do not wax your floors.  Repair uneven or unsafe sidewalks, walkways, or stairs.  Keep items you use a lot within reach.  Be aware of pets.  Keep emergency numbers next to the telephone.  Put smoke detectors in your home and near bedrooms. Ask your doctor what other things you can do to prevent falls. Document Released: 08/08/2009 Document Revised: 04/12/2012 Document Reviewed: 01/12/2012 [facility] Patient Information 2015 [place], [place]. This information is not intended to replace advice given to you by your health care provider. Make sure you discuss any questions you have with your health care provider.  Wrist Pain A wrist sprain happens when the bands of tissue that hold the wrist joints together (ligament) stretch too much or tear. A wrist strain happens when muscles or bands of tissue that connect muscles to bones (tendons) are stretched or pulled. HOME CARE  Put ice on the injured area.  Put ice in a plastic bag.  Place a towel between your skin and the bag.  Leave the ice on for 15-20 minutes, 03-04 times a day, for the first 2 days.  Raise (elevate) the injured wrist to lessen puffiness (swelling).  Rest the injured wrist for at least 48 hours or as told by your doctor.  Wear a splint,  cast, or an elastic wrap as told by your doctor.  Only take medicine as told by your doctor.  Follow up with your doctor as told. This is important. GET HELP RIGHT AWAY IF:   The fingers are puffy, very red, white, or cold and blue.  The fingers lose feeling (numb) or tingle.  The pain gets worse.  It is hard to move the fingers. MAKE SURE YOU:   Understand these instructions.  Will watch your condition.  Will get help right away if you are not doing well or get worse. Document Released: 03/30/2008 Document Revised: 01/04/2012 Document Reviewed: 12/03/2010 [facility] Patient Information 2015 [place], [place]. This information is not intended to replace advice given to you by your health care provider. Make sure you discuss any questions you have with your health care provider.

## 2014-06-12 ENCOUNTER — Telehealth (HOSPITAL_BASED_OUTPATIENT_CLINIC_OR_DEPARTMENT_OTHER): Payer: Self-pay | Admitting: Emergency Medicine

## 2014-06-12 LAB — URINE CULTURE

## 2014-06-12 NOTE — Telephone Encounter (Signed)
Post ED Visit - Positive Culture Follow-up: Successful Patient Follow-Up  Culture assessed and recommendations reviewed by: []  Wes [place], Pharm.D., BCPS []  Heide Guile, Pharm.D., BCPS []  Alycia Rossetti, Pharm.D., BCPS []  [place], Pharm.D., BCPS, AAHIVP []  [name], Pharm.D., BCPS, AAHIVP []  Hassie Bruce, Pharm.D. [x]  Cassie Nicole Kindred, Pharm.D.  Positive urine culture >100,000 colonies/ml, E. coli  []  Patient discharged without antimicrobial prescription and treatment is now indicated []  Organism is resistant to prescribed ED discharge antimicrobial []  Patient with positive blood cultures  Changes discussed with ED provider: Harvie Heck [facility] antibiotic prescription  Cephalexin 500mg  po bid x 7 days Called to [place] [phone]  Contacted patient, date 06/12/14 time 1600   Hazle Nordmann 06/12/2014, 4:07 PM

## 2014-06-12 NOTE — Progress Notes (Signed)
ED Antimicrobial Stewardship Positive Culture Follow Up   Molly [name] is an 80 y.o. female who presented to [facility] on 06/08/2014 with a chief complaint of  Chief Complaint  Patient presents with  . Head Injury  . Wrist Pain    Recent Results (from the past 720 hour(s))  URINE CULTURE     Status: None   Collection Time    06/08/14  4:08 PM      Result Value Ref Range Status   Specimen Description URINE, CLEAN CATCH   Final   Special Requests NONE   Final   Culture  Setup Time     Final   Value: 06/09/2014 19:55     Performed at [facility]     Final   Value: >=100,000 COLONIES/ML     Performed at [facility]   Culture     Final   Value: ESCHERICHIA COLI     Performed at [facility]   Report Status 06/12/2014 FINAL   Final   Organism ID, Bacteria ESCHERICHIA COLI   Final    [x]  Patient discharged originally without antimicrobial agent and treatment is now indicated  New antibiotic prescription: Cephalexin 500 mg PO BID x 7 days.  ED Provider: Harvie Heck, PA-C  Nicole Kindred, Ailsa Mireles L 06/12/2014, 12:52 PM Infectious Diseases Pharmacist Phone# [phone]

## 2014-06-12 NOTE — Telephone Encounter (Incomplete)
Post ED Visit - Positive Culture Follow-up  Culture report reviewed by antimicrobial stewardship pharmacist: []  Wes [place], Pharm.D., BCPS []  Heide Guile, Pharm.D., BCPS []  Alycia Rossetti, Pharm.D., BCPS []  [place], Pharm.D., BCPS, AAHIVP []  [name], Pharm.D., BCPS, AAHIVP []  Hassie Bruce, Pharm.D. [x]  Milus Glazier, Pharm.D.  Positive *** culture Treated with ***, organism sensitive to the same and no further patient follow-up is required at this time.  Hazle Nordmann 06/12/2014, 1:51 PM

## 2014-06-13 NOTE — ED Provider Notes (Signed)
Pt fell this am, tripping over an object.  Takes coumadin.  Labs and xrays unremarkable.  At this time there does not appear to be any evidence of an acute emergency medical condition and the patient appears stable for discharge with appropriate outpatient follow up.  Medical screening examination/treatment/procedure(s) were conducted as a shared visit with non-physician practitioner(s) and myself.  I personally evaluated the patient during the encounter.   EKG Interpretation   Date/Time:  Friday June 08 2014 16:06:38 EDT Ventricular Rate:  71 PR Interval:  147 QRS Duration: 137 QT Interval:  432 QTC Calculation: 469 R Axis:   -53 Text Interpretation:  Atrial-paced rhythm Left bundle branch block ED  PHYSICIAN INTERPRETATION AVAILABLE IN [facility] Confirmed by TEST,  Record ([id number]) on 06/10/2014 7:19:41 AM        Dorie Rank, MD 06/13/14 1530

## 2014-06-19 ENCOUNTER — Other Ambulatory Visit: Payer: Self-pay | Admitting: *Deleted

## 2014-06-19 MED ORDER — WARFARIN SODIUM 5 MG PO TABS: ORAL_TABLET | ORAL | Status: DC

## 2014-07-03 ENCOUNTER — Ambulatory Visit: Payer: Medicare Other | Admitting: Cardiology

## 2014-07-11 ENCOUNTER — Ambulatory Visit (INDEPENDENT_AMBULATORY_CARE_PROVIDER_SITE_OTHER): Payer: Medicare Other | Admitting: Pharmacist

## 2014-07-11 ENCOUNTER — Ambulatory Visit (INDEPENDENT_AMBULATORY_CARE_PROVIDER_SITE_OTHER): Payer: Medicare Other | Admitting: Cardiology

## 2014-07-11 ENCOUNTER — Encounter: Payer: Self-pay | Admitting: Cardiology

## 2014-07-11 VITALS — BP 130/100 | HR 76 | Ht 66.0 in | Wt 170.0 lb

## 2014-07-11 DIAGNOSIS — I10 Essential (primary) hypertension: Secondary | ICD-10-CM

## 2014-07-11 DIAGNOSIS — I4891 Unspecified atrial fibrillation: Secondary | ICD-10-CM

## 2014-07-11 DIAGNOSIS — I4949 Other premature depolarization: Secondary | ICD-10-CM

## 2014-07-11 DIAGNOSIS — I493 Ventricular premature depolarization: Secondary | ICD-10-CM

## 2014-07-11 DIAGNOSIS — I495 Sick sinus syndrome: Secondary | ICD-10-CM | POA: Diagnosis not present

## 2014-07-11 DIAGNOSIS — Z950 Presence of cardiac pacemaker: Secondary | ICD-10-CM

## 2014-07-11 DIAGNOSIS — I480 Paroxysmal atrial fibrillation: Secondary | ICD-10-CM

## 2014-07-11 LAB — POCT INR: INR: 2.7

## 2014-07-11 NOTE — Progress Notes (Signed)
[address], [place], [place]  [address] Phone: [phone] Fax:  [phone]  Date:  07/11/2014   ID:  Molly [name], DOB 01-08-1933, MRN [id number]  PCP:  Gara Kroner, MD  Cardiologist:  Fransico Him, MD     History of Present Illness: Molly [name] is a 80 y.o. female with a history of paroxysmal atrial fibrillation on chronic systemic anticoagulation, HTN, symptomatic PVC's, low normal LVF EF 50-55%, tachybrady syndrome s/p PPM who presents today for followup. She is doing well. She denies any chest pain, SOB, DOE, LE edema, dizziness or syncope. She occasionally has some skipped heart beats.   Wt Readings from Last 3 Encounters:  07/11/14 170 lb (77.111 kg)  03/07/14 172 lb (78.019 kg)  01/01/14 174 lb (78.926 kg)     Past Medical History  Diagnosis Date  . Hypertension   . Atrial fibrillation     paroxysmal  . High cholesterol   . Hyperparathyroidism   . Shortness of breath   . GERD (gastroesophageal reflux disease)   . PAF (paroxysmal atrial fibrillation)   . H/O hyperkalemia     Resolved off ACE inhibitors  . Renal insufficiency     MILD  . LV dysfunction     MILD-EF40-45% 2003 now EF 50-55%  . H/O benign essential tremor     on amiodarone resolved on lower dose of amio  . PVC's (premature ventricular contractions)   . Tachycardia-bradycardia syndrome     s/p PPM 02/2013    Current Outpatient Prescriptions  Medication Sig Dispense Refill  . acetaminophen (TYLENOL) 500 MG tablet Take 1,000 mg by mouth every 6 (six) hours as needed. For pain      . atorvastatin (LIPITOR) 10 MG tablet Take 10 mg by mouth every evening.      . furosemide (LASIX) 20 MG tablet Take 20 mg by mouth daily.      Marland Kitchen LORazepam (ATIVAN) 0.5 MG tablet Take 0.25 mg by mouth at bedtime.      . Multiple Vitamin (MULTIVITAMIN WITH MINERALS) TABS Take 1 tablet by mouth daily.      Marland Kitchen omeprazole (PRILOSEC) 20 MG capsule Take 20 mg by mouth daily.      . sotalol (BETAPACE)  80 MG tablet Take 1 tablet (80 mg total) by mouth every 12 (twelve) hours.  60 tablet  11  . warfarin (COUMADIN) 5 MG tablet Take as directed by anticoagulation clinic  30 tablet  3   No current facility-administered medications for this visit.    Allergies:    Allergies  Allergen Reactions  . Tramadol Nausea Only    Social History:  The patient  reports that she has never smoked. She has never used smokeless tobacco. She reports that she does not drink alcohol or use illicit drugs.   Family History:  The patient's family history includes Hypertension in her father and mother.   ROS:  Please see the history of present illness.      All other systems reviewed and negative.   PHYSICAL EXAM: VS:  BP 130/100  Pulse 76  Ht 5\' 6"  (1.676 m)  Wt 170 lb (77.111 kg)  BMI 27.45 kg/m2 Well nourished, well developed, in no acute distress HEENT: normal Neck: no JVD Cardiac:  normal S1, S2; RRR; no murmur Lungs:  clear to auscultation bilaterally, no wheezing, rhonchi or rales Abd: soft, nontender, no hepatomegaly Ext: no edema Skin: warm and dry Neuro:  CNs 2-12 intact, no focal abnormalities  noted   ASSESSMENT AND PLAN:  1. Paroxysmal atrial fibrillation - continue Warfarin/sotolol  2. Chronic systemic anticoagulation 4. HTN - elevated today but she has had white coat HTN in the past.  On average at home it runs around 120/53mmhg. 5. Symptomatic PVC's - resolved 6. Tachybrady syndrome s/p PPM  Followup with me in 6 months     Signed, Fransico Him, MD 07/11/2014 3:44 PM

## 2014-07-11 NOTE — Patient Instructions (Signed)
Your physician recommends that you continue on your current medications as directed. Please refer to the Current Medication list given to you today.  Your physician wants you to follow-up in: 6 months with Dr Turner You will receive a reminder letter in the mail two months in advance. If you don't receive a letter, please call our office to schedule the follow-up appointment.  

## 2014-07-25 DIAGNOSIS — H4011X0 Primary open-angle glaucoma, stage unspecified: Secondary | ICD-10-CM | POA: Diagnosis not present

## 2014-07-25 DIAGNOSIS — H409 Unspecified glaucoma: Secondary | ICD-10-CM | POA: Diagnosis not present

## 2014-08-13 DIAGNOSIS — L578 Other skin changes due to chronic exposure to nonionizing radiation: Secondary | ICD-10-CM | POA: Diagnosis not present

## 2014-08-13 DIAGNOSIS — Z85828 Personal history of other malignant neoplasm of skin: Secondary | ICD-10-CM | POA: Diagnosis not present

## 2014-08-13 DIAGNOSIS — D692 Other nonthrombocytopenic purpura: Secondary | ICD-10-CM | POA: Diagnosis not present

## 2014-08-13 DIAGNOSIS — L570 Actinic keratosis: Secondary | ICD-10-CM | POA: Diagnosis not present

## 2014-08-13 DIAGNOSIS — L738 Other specified follicular disorders: Secondary | ICD-10-CM | POA: Diagnosis not present

## 2014-08-13 DIAGNOSIS — D225 Melanocytic nevi of trunk: Secondary | ICD-10-CM | POA: Diagnosis not present

## 2014-08-13 DIAGNOSIS — L718 Other rosacea: Secondary | ICD-10-CM | POA: Diagnosis not present

## 2014-08-13 DIAGNOSIS — L821 Other seborrheic keratosis: Secondary | ICD-10-CM | POA: Diagnosis not present

## 2014-08-14 DIAGNOSIS — N183 Chronic kidney disease, stage 3 (moderate): Secondary | ICD-10-CM | POA: Diagnosis not present

## 2014-08-14 DIAGNOSIS — F419 Anxiety disorder, unspecified: Secondary | ICD-10-CM | POA: Diagnosis not present

## 2014-08-14 DIAGNOSIS — R609 Edema, unspecified: Secondary | ICD-10-CM | POA: Diagnosis not present

## 2014-08-14 DIAGNOSIS — I4891 Unspecified atrial fibrillation: Secondary | ICD-10-CM | POA: Diagnosis not present

## 2014-08-14 DIAGNOSIS — F329 Major depressive disorder, single episode, unspecified: Secondary | ICD-10-CM | POA: Diagnosis not present

## 2014-08-14 DIAGNOSIS — E782 Mixed hyperlipidemia: Secondary | ICD-10-CM | POA: Diagnosis not present

## 2014-08-14 DIAGNOSIS — Z23 Encounter for immunization: Secondary | ICD-10-CM | POA: Diagnosis not present

## 2014-08-14 DIAGNOSIS — I129 Hypertensive chronic kidney disease with stage 1 through stage 4 chronic kidney disease, or unspecified chronic kidney disease: Secondary | ICD-10-CM | POA: Diagnosis not present

## 2014-08-14 DIAGNOSIS — K219 Gastro-esophageal reflux disease without esophagitis: Secondary | ICD-10-CM | POA: Diagnosis not present

## 2014-08-27 ENCOUNTER — Ambulatory Visit (INDEPENDENT_AMBULATORY_CARE_PROVIDER_SITE_OTHER): Payer: Medicare Other | Admitting: *Deleted

## 2014-08-27 DIAGNOSIS — I4891 Unspecified atrial fibrillation: Secondary | ICD-10-CM

## 2014-08-27 LAB — POCT INR: INR: 2.1

## 2014-09-04 DIAGNOSIS — M4155 Other secondary scoliosis, thoracolumbar region: Secondary | ICD-10-CM | POA: Diagnosis not present

## 2014-09-04 DIAGNOSIS — M5441 Lumbago with sciatica, right side: Secondary | ICD-10-CM | POA: Diagnosis not present

## 2014-09-24 ENCOUNTER — Ambulatory Visit (INDEPENDENT_AMBULATORY_CARE_PROVIDER_SITE_OTHER): Payer: Medicare Other | Admitting: Pharmacist

## 2014-09-24 ENCOUNTER — Ambulatory Visit (INDEPENDENT_AMBULATORY_CARE_PROVIDER_SITE_OTHER): Payer: Medicare Other | Admitting: *Deleted

## 2014-09-24 DIAGNOSIS — R001 Bradycardia, unspecified: Secondary | ICD-10-CM | POA: Diagnosis not present

## 2014-09-24 DIAGNOSIS — I480 Paroxysmal atrial fibrillation: Secondary | ICD-10-CM

## 2014-09-24 DIAGNOSIS — I493 Ventricular premature depolarization: Secondary | ICD-10-CM

## 2014-09-24 DIAGNOSIS — I495 Sick sinus syndrome: Secondary | ICD-10-CM

## 2014-09-24 DIAGNOSIS — I4891 Unspecified atrial fibrillation: Secondary | ICD-10-CM

## 2014-09-24 LAB — MDC_IDC_ENUM_SESS_TYPE_INCLINIC
Date Time Interrogation Session: 20151130050000
Eval Rhythm: 1:1 {titer}
Lead Channel Impedance Value: 663 Ohm
Lead Channel Pacing Threshold Amplitude: 1 V
Lead Channel Pacing Threshold Pulse Width: 0.4 ms
Lead Channel Sensing Intrinsic Amplitude: 12.5 mV
Lead Channel Sensing Intrinsic Amplitude: 2.8 mV
MDC IDC MSMT LEADCHNL RV IMPEDANCE VALUE: 680 Ohm
MDC IDC MSMT LEADCHNL RV PACING THRESHOLD AMPLITUDE: 0.7 V
MDC IDC MSMT LEADCHNL RV PACING THRESHOLD PULSEWIDTH: 0.4 ms
MDC IDC PG SERIAL: 112392
MDC IDC SET LEADCHNL RA PACING AMPLITUDE: 2 V
MDC IDC SET LEADCHNL RV PACING AMPLITUDE: 2.4 V
MDC IDC SET LEADCHNL RV PACING PULSEWIDTH: 0.4 ms
MDC IDC SET LEADCHNL RV SENSING SENSITIVITY: 2.5 mV
Zone Setting Detection Interval: 375 ms

## 2014-09-24 LAB — POCT INR: INR: 1.7

## 2014-09-24 NOTE — Progress Notes (Addendum)
Patient presents for device clinic pacemaker check.  No problems with shortness of breath, chest pain, palpitations, or syncope.  Device interrogated and found to be functioning normally.  No changes made today.  See Pace[facility] report for full details.  Plan ROV with Dr. [name] in 6 months.  Ranee Gosselin, RN, BSN 09/24/2014 4:11 PM

## 2014-09-25 DIAGNOSIS — M2011 Hallux valgus (acquired), right foot: Secondary | ICD-10-CM | POA: Diagnosis not present

## 2014-09-25 DIAGNOSIS — M71571 Other bursitis, not elsewhere classified, right ankle and foot: Secondary | ICD-10-CM | POA: Diagnosis not present

## 2014-09-25 DIAGNOSIS — M792 Neuralgia and neuritis, unspecified: Secondary | ICD-10-CM | POA: Diagnosis not present

## 2014-09-25 DIAGNOSIS — M65871 Other synovitis and tenosynovitis, right ankle and foot: Secondary | ICD-10-CM | POA: Diagnosis not present

## 2014-10-02 DIAGNOSIS — M2011 Hallux valgus (acquired), right foot: Secondary | ICD-10-CM | POA: Diagnosis not present

## 2014-10-02 DIAGNOSIS — M792 Neuralgia and neuritis, unspecified: Secondary | ICD-10-CM | POA: Diagnosis not present

## 2014-10-02 DIAGNOSIS — G5761 Lesion of plantar nerve, right lower limb: Secondary | ICD-10-CM | POA: Diagnosis not present

## 2014-10-04 ENCOUNTER — Encounter (HOSPITAL_COMMUNITY): Payer: Self-pay | Admitting: Internal Medicine

## 2014-10-12 ENCOUNTER — Encounter: Payer: Self-pay | Admitting: Internal Medicine

## 2014-10-24 ENCOUNTER — Ambulatory Visit (INDEPENDENT_AMBULATORY_CARE_PROVIDER_SITE_OTHER): Payer: Medicare Other | Admitting: Pharmacist

## 2014-10-24 DIAGNOSIS — I4891 Unspecified atrial fibrillation: Secondary | ICD-10-CM | POA: Diagnosis not present

## 2014-10-24 LAB — POCT INR: INR: 2.4

## 2014-10-30 ENCOUNTER — Other Ambulatory Visit: Payer: Self-pay | Admitting: *Deleted

## 2014-10-30 MED ORDER — WARFARIN SODIUM 5 MG PO TABS: ORAL_TABLET | ORAL | Status: DC

## 2014-10-30 NOTE — Telephone Encounter (Signed)
Refill done as requested 

## 2014-10-31 ENCOUNTER — Encounter: Payer: Self-pay | Admitting: Cardiology

## 2014-11-01 ENCOUNTER — Other Ambulatory Visit: Payer: Self-pay | Admitting: *Deleted

## 2014-11-01 MED ORDER — WARFARIN SODIUM 5 MG PO TABS: ORAL_TABLET | ORAL | Status: DC

## 2014-11-01 NOTE — Telephone Encounter (Signed)
Refill done as requested 

## 2014-11-12 DIAGNOSIS — M2041 Other hammer toe(s) (acquired), right foot: Secondary | ICD-10-CM | POA: Diagnosis not present

## 2014-11-12 DIAGNOSIS — M205X1 Other deformities of toe(s) (acquired), right foot: Secondary | ICD-10-CM | POA: Diagnosis not present

## 2014-11-20 ENCOUNTER — Telehealth: Payer: Self-pay | Admitting: Pharmacist

## 2014-11-20 NOTE — Telephone Encounter (Signed)
New Message  Pt having surgery on Monday 2/2 and had a question about her coumadin. Please call back and discuss.

## 2014-11-20 NOTE — Telephone Encounter (Signed)
Spoke with pt and instructed her to keep her appt in the clinic tomorrow as we will need to check her INR. Also called and placed on Pollyann Savoy voice mail  at [facility] asking if she has received cardiac clearance for pt 's surgery on February 2nd  Received call from [facility] and she is faxing over cardiac clearance orders now

## 2014-11-21 ENCOUNTER — Telehealth: Payer: Self-pay | Admitting: Cardiology

## 2014-11-21 ENCOUNTER — Ambulatory Visit (INDEPENDENT_AMBULATORY_CARE_PROVIDER_SITE_OTHER): Payer: Medicare Other | Admitting: *Deleted

## 2014-11-21 DIAGNOSIS — I4891 Unspecified atrial fibrillation: Secondary | ICD-10-CM | POA: Diagnosis not present

## 2014-11-21 LAB — POCT INR: INR: 2

## 2014-11-21 NOTE — Telephone Encounter (Signed)
Received request from Nurse fax box, documents faxed for surgical clearance. To: [employer] Fax number: [phone] Attention: [phone]

## 2014-11-26 ENCOUNTER — Encounter (HOSPITAL_COMMUNITY)
Admission: RE | Admit: 2014-11-26 | Discharge: 2014-11-26 | Disposition: A | Discharge status: Home / Self Care | Payer: Medicare Other | Source: Ambulatory Visit | Attending: Orthopedic Surgery | Admitting: Orthopedic Surgery

## 2014-11-26 ENCOUNTER — Telehealth: Payer: Self-pay | Admitting: Cardiology

## 2014-11-26 ENCOUNTER — Encounter (HOSPITAL_COMMUNITY): Payer: Self-pay

## 2014-11-26 DIAGNOSIS — I10 Essential (primary) hypertension: Secondary | ICD-10-CM | POA: Diagnosis not present

## 2014-11-26 DIAGNOSIS — Z7901 Long term (current) use of anticoagulants: Secondary | ICD-10-CM | POA: Diagnosis not present

## 2014-11-26 DIAGNOSIS — Z950 Presence of cardiac pacemaker: Secondary | ICD-10-CM | POA: Diagnosis not present

## 2014-11-26 DIAGNOSIS — K219 Gastro-esophageal reflux disease without esophagitis: Secondary | ICD-10-CM | POA: Diagnosis not present

## 2014-11-26 DIAGNOSIS — I4891 Unspecified atrial fibrillation: Secondary | ICD-10-CM | POA: Diagnosis not present

## 2014-11-26 DIAGNOSIS — M205X1 Other deformities of toe(s) (acquired), right foot: Secondary | ICD-10-CM | POA: Diagnosis not present

## 2014-11-26 HISTORY — DX: Malignant (primary) neoplasm, unspecified: C80.1

## 2014-11-26 HISTORY — DX: Other specified postprocedural states: Z98.890

## 2014-11-26 HISTORY — DX: Other complications of anesthesia, initial encounter: T88.59XA

## 2014-11-26 HISTORY — DX: Adverse effect of unspecified anesthetic, initial encounter: T41.45XA

## 2014-11-26 HISTORY — DX: Personal history of other medical treatment: Z92.89

## 2014-11-26 HISTORY — DX: Nausea with vomiting, unspecified: R11.2

## 2014-11-26 LAB — BASIC METABOLIC PANEL
ANION GAP: 4 — AB (ref 5–15)
BUN: 23 mg/dL (ref 6–23)
CHLORIDE: 108 mmol/L (ref 96–112)
CO2: 27 mmol/L (ref 19–32)
Calcium: 10.7 mg/dL — ABNORMAL HIGH (ref 8.4–10.5)
Creatinine, Ser: 1.09 mg/dL (ref 0.50–1.10)
GFR calc Af Amer: 54 mL/min — ABNORMAL LOW (ref >90)
GFR calc non Af Amer: 47 mL/min — ABNORMAL LOW (ref >90)
GLUCOSE: 93 mg/dL (ref 70–99)
POTASSIUM: 4.8 mmol/L (ref 3.5–5.1)
SODIUM: 139 mmol/L (ref 135–145)

## 2014-11-26 LAB — CBC
HCT: 41 % (ref 36.0–46.0)
Hemoglobin: 13.1 g/dL (ref 12.0–15.0)
MCH: 32.7 pg (ref 26.0–34.0)
MCHC: 32 g/dL (ref 30.0–36.0)
MCV: 102.2 fL — ABNORMAL HIGH (ref 78.0–100.0)
Platelets: 191 10*3/uL (ref 150–400)
RBC: 4.01 MIL/uL (ref 3.87–5.11)
RDW: 13.8 % (ref 11.5–15.5)
WBC: 8.2 10*3/uL (ref 4.0–10.5)

## 2014-11-26 LAB — PROTIME-INR
INR: 1.11 (ref 0.00–1.49)
PROTHROMBIN TIME: 14.5 s (ref 11.6–15.2)

## 2014-11-26 NOTE — Pre-Procedure Instructions (Signed)
GUISELLE MIAN  11/26/2014   Your procedure is scheduled on:  Tuesday, January 2.  Report to [facility] Admitting at 12:30 AM.  Call this number if you have problems the morning of surgery: [phone]   Remember:   Do not eat food or drink liquids after midnight  Take these medicines the morning of surgery with A SIP OF WATER: omeprazole (PRILOSEC), sotalol (BETAPACE).                Take if needed: acetaminophen (TYLENOL).   Do not wear jewelry, make-up or nail polish.  Do not wear lotions, powders, or perfumes.   Do not shave 48 hours prior to surgery.   Do not bring valuables to the hospital.            [facility] is not responsible  for any belongings or valuables.               Contacts, dentures or bridgework may not be worn into surgery.  Leave suitcase in the car. After surgery it may be brought to your room.  For patients admitted to the hospital, discharge time is determined by your treatment team.               Patients discharged the day of surgery will not be allowed to drive home.  Name and phone number of your driver: -   Special Instructions: Review  [place] - Preparing For Surgery.   Please read over the following fact sheets that you were given: Pain Booklet, Coughing and Deep Breathing and Surgical Site Infection Prevention

## 2014-11-26 NOTE — Pre-Procedure Instructions (Signed)
Molly [name]  11/26/2014   Your procedure is scheduled on:  Tuesday, January 2.  Report to [facility] Admitting at 12:30 AM.  Call this number if you have problems the morning of surgery: [phone]   Remember:   Do not eat food or drink liquids after midnight  Take these medicines the morning of surgery with A SIP OF WATER: omeprazole (PRILOSEC).    Do not wear jewelry, make-up or nail polish.  Do not wear lotions, powders, or perfumes.   Do not shave 48 hours prior to surgery.   Do not bring valuables to the hospital.            [facility] is not responsible  for any belongings or valuables.               Contacts, dentures or bridgework may not be worn into surgery.  Leave suitcase in the car. After surgery it may be brought to your room.  For patients admitted to the hospital, discharge time is determined by your treatment team.               Patients discharged the day of surgery will not be allowed to drive home.  Name and phone number of your driver: -   Special Instructions: Review  [place] - Preparing For Surgery.   Please read over the following fact sheets that you were given: Pain Booklet, Coughing and Deep Breathing and Surgical Site Infection Prevention

## 2014-11-26 NOTE — Telephone Encounter (Signed)
Patient has right forefoot pain and saw ortho and was diagnosed with hammertoe and needs preoperative clearance for 2nd and 3rd toe amputation.  She has PAF and has been maintaining NSR on Sotolol.  She will need to see coumadin clinic for guidance on her coumadin cessation.  She has a pacemaker so please have pacer clinic fill out form for surgical clearance.

## 2014-11-26 NOTE — Telephone Encounter (Addendum)
To Coumadin Clinic for clearance. Printed and given to [facility] for clearance.

## 2014-11-27 ENCOUNTER — Ambulatory Visit (HOSPITAL_BASED_OUTPATIENT_CLINIC_OR_DEPARTMENT_OTHER)
Admission: RE | Admit: 2014-11-27 | Discharge: 2014-11-27 | Disposition: A | Discharge status: Home / Self Care | Payer: Medicare Other | Source: Ambulatory Visit | Attending: Orthopedic Surgery | Admitting: Orthopedic Surgery

## 2014-11-27 ENCOUNTER — Ambulatory Visit (HOSPITAL_COMMUNITY): Payer: Medicare Other | Admitting: Certified Registered Nurse Anesthetist

## 2014-11-27 ENCOUNTER — Encounter (HOSPITAL_COMMUNITY)
Admission: RE | Disposition: A | Discharge status: Home / Self Care | Payer: Self-pay | Source: Ambulatory Visit | Attending: Orthopedic Surgery

## 2014-11-27 ENCOUNTER — Encounter (HOSPITAL_COMMUNITY): Payer: Self-pay | Admitting: Certified Registered Nurse Anesthetist

## 2014-11-27 DIAGNOSIS — I4891 Unspecified atrial fibrillation: Secondary | ICD-10-CM | POA: Diagnosis not present

## 2014-11-27 DIAGNOSIS — Z7901 Long term (current) use of anticoagulants: Secondary | ICD-10-CM | POA: Diagnosis not present

## 2014-11-27 DIAGNOSIS — M205X1 Other deformities of toe(s) (acquired), right foot: Secondary | ICD-10-CM | POA: Diagnosis not present

## 2014-11-27 DIAGNOSIS — R0602 Shortness of breath: Secondary | ICD-10-CM | POA: Diagnosis not present

## 2014-11-27 DIAGNOSIS — K219 Gastro-esophageal reflux disease without esophagitis: Secondary | ICD-10-CM | POA: Diagnosis not present

## 2014-11-27 DIAGNOSIS — Z950 Presence of cardiac pacemaker: Secondary | ICD-10-CM | POA: Insufficient documentation

## 2014-11-27 DIAGNOSIS — M205X9 Other deformities of toe(s) (acquired), unspecified foot: Secondary | ICD-10-CM | POA: Diagnosis not present

## 2014-11-27 DIAGNOSIS — I10 Essential (primary) hypertension: Secondary | ICD-10-CM | POA: Diagnosis not present

## 2014-11-27 HISTORY — PX: AMPUTATION: SHX166

## 2014-11-27 SURGERY — Surgical Case
Anesthesia: *Unknown

## 2014-11-27 SURGERY — AMPUTATION, FOOT, PARTIAL
Anesthesia: Monitor Anesthesia Care | Site: Toe | Laterality: Right

## 2014-11-27 MED ORDER — FENTANYL CITRATE 0.05 MG/ML IJ SOLN: 25.0000 ug | INTRAMUSCULAR | Status: DC | PRN

## 2014-11-27 MED ORDER — PROPOFOL 10 MG/ML IV BOLUS
INTRAVENOUS | Status: AC
  Filled 2014-11-27: qty 20

## 2014-11-27 MED ORDER — FENTANYL CITRATE 0.05 MG/ML IJ SOLN
INTRAMUSCULAR | Status: DC | PRN
  Administered 2014-11-27: 25 ug via INTRAVENOUS
  Administered 2014-11-27 (×2): 50 ug via INTRAVENOUS
  Administered 2014-11-27: 25 ug via INTRAVENOUS

## 2014-11-27 MED ORDER — NEOSTIGMINE METHYLSULFATE 10 MG/10ML IV SOLN
INTRAVENOUS | Status: AC
  Filled 2014-11-27: qty 1

## 2014-11-27 MED ORDER — LIDOCAINE HCL (CARDIAC) 20 MG/ML IV SOLN
INTRAVENOUS | Status: DC | PRN
  Administered 2014-11-27: 100 mg via INTRAVENOUS

## 2014-11-27 MED ORDER — PROMETHAZINE HCL 25 MG/ML IJ SOLN: 6.2500 mg | INTRAMUSCULAR | Status: DC | PRN

## 2014-11-27 MED ORDER — PHENYLEPHRINE HCL 10 MG/ML IJ SOLN
INTRAMUSCULAR | Status: DC | PRN
  Administered 2014-11-27: 40 ug via INTRAVENOUS

## 2014-11-27 MED ORDER — PHENYLEPHRINE 40 MCG/ML (10ML) SYRINGE FOR IV PUSH (FOR BLOOD PRESSURE SUPPORT)
PREFILLED_SYRINGE | INTRAVENOUS | Status: AC
  Filled 2014-11-27: qty 10

## 2014-11-27 MED ORDER — CEFAZOLIN SODIUM-DEXTROSE 2-3 GM-% IV SOLR
2.0000 g | INTRAVENOUS | Status: AC
  Administered 2014-11-27: 2 g via INTRAVENOUS
  Filled 2014-11-27: qty 50

## 2014-11-27 MED ORDER — DEXAMETHASONE SODIUM PHOSPHATE 4 MG/ML IJ SOLN
INTRAMUSCULAR | Status: DC | PRN
  Administered 2014-11-27: 4 mg via INTRAVENOUS

## 2014-11-27 MED ORDER — CHLORHEXIDINE GLUCONATE 4 % EX LIQD
60.0000 mL | Freq: Once | CUTANEOUS | Status: DC
  Filled 2014-11-27: qty 60

## 2014-11-27 MED ORDER — FENTANYL CITRATE 0.05 MG/ML IJ SOLN
INTRAMUSCULAR | Status: AC
  Filled 2014-11-27: qty 5

## 2014-11-27 MED ORDER — ONDANSETRON HCL 4 MG/2ML IJ SOLN
INTRAMUSCULAR | Status: AC
  Filled 2014-11-27: qty 2

## 2014-11-27 MED ORDER — LACTATED RINGERS IV SOLN
INTRAVENOUS | Status: DC
  Administered 2014-11-27: 13:00:00 via INTRAVENOUS

## 2014-11-27 MED ORDER — DEXMEDETOMIDINE HCL IN NACL 200 MCG/50ML IV SOLN
INTRAVENOUS | Status: AC
  Filled 2014-11-27: qty 50

## 2014-11-27 MED ORDER — PROPOFOL 10 MG/ML IV BOLUS
INTRAVENOUS | Status: DC | PRN
  Administered 2014-11-27: 150 mg via INTRAVENOUS
  Administered 2014-11-27: 20 mg via INTRAVENOUS

## 2014-11-27 MED ORDER — 0.9 % SODIUM CHLORIDE (POUR BTL) OPTIME
TOPICAL | Status: DC | PRN
  Administered 2014-11-27: 1000 mL

## 2014-11-27 MED ORDER — HYDROCODONE-ACETAMINOPHEN 5-325 MG PO TABS: 1.0000 | ORAL_TABLET | Freq: Four times a day (QID) | ORAL | Status: DC | PRN

## 2014-11-27 MED ORDER — GLYCOPYRROLATE 0.2 MG/ML IJ SOLN
INTRAMUSCULAR | Status: AC
  Filled 2014-11-27: qty 2

## 2014-11-27 MED ORDER — MEPERIDINE HCL 25 MG/ML IJ SOLN: 6.2500 mg | INTRAMUSCULAR | Status: DC | PRN

## 2014-11-27 MED ORDER — LIDOCAINE HCL (CARDIAC) 20 MG/ML IV SOLN
INTRAVENOUS | Status: AC
  Filled 2014-11-27: qty 5

## 2014-11-27 MED ORDER — SODIUM CHLORIDE 0.9 % IV SOLN: INTRAVENOUS | Status: DC

## 2014-11-27 MED ORDER — MIDAZOLAM HCL 2 MG/2ML IJ SOLN
INTRAMUSCULAR | Status: AC
  Filled 2014-11-27: qty 2

## 2014-11-27 MED ORDER — BACITRACIN ZINC 500 UNIT/GM EX OINT
TOPICAL_OINTMENT | CUTANEOUS | Status: AC
  Filled 2014-11-27: qty 28.35

## 2014-11-27 MED ORDER — BUPIVACAINE-EPINEPHRINE 0.5% -1:200000 IJ SOLN
INTRAMUSCULAR | Status: DC | PRN
  Administered 2014-11-27: 10 mL

## 2014-11-27 MED ORDER — ONDANSETRON HCL 4 MG/2ML IJ SOLN
INTRAMUSCULAR | Status: DC | PRN
  Administered 2014-11-27: 4 mg via INTRAVENOUS

## 2014-11-27 MED ORDER — BUPIVACAINE-EPINEPHRINE (PF) 0.5% -1:200000 IJ SOLN
INTRAMUSCULAR | Status: AC
  Filled 2014-11-27: qty 30

## 2014-11-27 SURGICAL SUPPLY — 44 items
BANDAGE ELASTIC 4 VELCRO ST LF (GAUZE/BANDAGES/DRESSINGS) IMPLANT
BANDAGE ELASTIC 6 VELCRO ST LF (GAUZE/BANDAGES/DRESSINGS) IMPLANT
BANDAGE GAUZE 4  KLING STR (GAUZE/BANDAGES/DRESSINGS) ×2 IMPLANT
BLADE SAW SGTL MED 73X18.5 STR (BLADE) IMPLANT
BNDG COHESIVE 4X5 TAN STRL (GAUZE/BANDAGES/DRESSINGS) ×2 IMPLANT
BNDG ESMARK 4X9 LF (GAUZE/BANDAGES/DRESSINGS) ×2 IMPLANT
CANISTER SUCT 3000ML (MISCELLANEOUS) ×2 IMPLANT
CUFF TOURNIQUET SINGLE 34IN LL (TOURNIQUET CUFF) IMPLANT
CUFF TOURNIQUET SINGLE 44IN (TOURNIQUET CUFF) IMPLANT
DRAPE U-SHAPE 47X51 STRL (DRAPES) ×2 IMPLANT
DRSG ADAPTIC 3X8 NADH LF (GAUZE/BANDAGES/DRESSINGS) ×2 IMPLANT
DRSG PAD ABDOMINAL 8X10 ST (GAUZE/BANDAGES/DRESSINGS) ×2 IMPLANT
DURAPREP 26ML APPLICATOR (WOUND CARE) ×2 IMPLANT
ELECT REM PT RETURN 9FT ADLT (ELECTROSURGICAL) ×2
ELECTRODE REM PT RTRN 9FT ADLT (ELECTROSURGICAL) ×1 IMPLANT
GAUZE SPONGE 4X4 12PLY STRL (GAUZE/BANDAGES/DRESSINGS) ×2 IMPLANT
GLOVE BIO SURGEON STRL SZ7 (GLOVE) IMPLANT
GLOVE BIO SURGEON STRL SZ8 (GLOVE) ×2 IMPLANT
GLOVE BIOGEL PI IND STRL 7.5 (GLOVE) IMPLANT
GLOVE BIOGEL PI IND STRL 8 (GLOVE) ×1 IMPLANT
GLOVE BIOGEL PI INDICATOR 7.5 (GLOVE)
GLOVE BIOGEL PI INDICATOR 8 (GLOVE) ×1
GOWN STRL REUS W/ TWL LRG LVL3 (GOWN DISPOSABLE) ×1 IMPLANT
GOWN STRL REUS W/ TWL XL LVL3 (GOWN DISPOSABLE) ×1 IMPLANT
GOWN STRL REUS W/TWL LRG LVL3 (GOWN DISPOSABLE) ×1
GOWN STRL REUS W/TWL XL LVL3 (GOWN DISPOSABLE) ×1
KIT BASIN OR (CUSTOM PROCEDURE TRAY) ×2 IMPLANT
KIT ROOM TURNOVER OR (KITS) ×2 IMPLANT
NS IRRIG 1000ML POUR BTL (IV SOLUTION) ×2 IMPLANT
PACK ORTHO EXTREMITY (CUSTOM PROCEDURE TRAY) ×2 IMPLANT
PAD ARMBOARD 7.5X6 YLW CONV (MISCELLANEOUS) ×2 IMPLANT
PAD CAST 4YDX4 CTTN HI CHSV (CAST SUPPLIES) ×1 IMPLANT
PADDING CAST ABS 6INX4YD NS (CAST SUPPLIES) ×1
PADDING CAST ABS COTTON 6X4 NS (CAST SUPPLIES) ×1 IMPLANT
PADDING CAST COTTON 4X4 STRL (CAST SUPPLIES) ×1
SPONGE LAP 18X18 X RAY DECT (DISPOSABLE) ×2 IMPLANT
STOCKINETTE IMPERVIOUS LG (DRAPES) IMPLANT
SUCTION FRAZIER TIP 10 FR DISP (SUCTIONS) ×2 IMPLANT
SUT ETHILON 2 0 PSLX (SUTURE) ×4 IMPLANT
TOWEL OR 17X24 6PK STRL BLUE (TOWEL DISPOSABLE) ×2 IMPLANT
TOWEL OR 17X26 10 PK STRL BLUE (TOWEL DISPOSABLE) ×2 IMPLANT
TUBE CONNECTING 12X1/4 (SUCTIONS) ×2 IMPLANT
UNDERPAD 30X30 INCONTINENT (UNDERPADS AND DIAPERS) ×2 IMPLANT
WATER STERILE IRR 1000ML POUR (IV SOLUTION) ×2 IMPLANT

## 2014-11-27 NOTE — Anesthesia Postprocedure Evaluation (Signed)
  Anesthesia Post-op Note  Patient: Molly [name]  Procedure(s) Performed: Procedure(s): RIGHT 2ND AND 3RD TOE AMPUTATION (Right)  Patient Location: PACU  Anesthesia Type:General  Level of Consciousness: awake and alert   Airway and Oxygen Therapy: Patient Spontanous Breathing and Patient connected to nasal cannula oxygen  Post-op Pain: mild  Post-op Assessment: Post-op Vital signs reviewed  Post-op Vital Signs: Reviewed  Last Vitals:  Filed Vitals:   11/27/14 1434  BP: 149/64  Pulse: 70  Temp:   Resp: 16    Complications: No apparent anesthesia complications

## 2014-11-27 NOTE — Brief Op Note (Signed)
11/27/2014  2:24 PM  PATIENT:  Molly [name]  81 y.o. female  PRE-OPERATIVE DIAGNOSIS:  RIGHT 2ND  3RD CROSS OVER TOES  POST-OPERATIVE DIAGNOSIS:  RIGHT 2ND  3RD CROSS OVER TOES  Procedure(s): RIGHT 2ND AND 3RD TOE AMPUTATIONs through the MTP joints  SURGEON:  Wylene Simmer, MD  ASSISTANT: n/a  ANESTHESIA:   General  EBL:  minimal   TOURNIQUET:   Total Tourniquet Time Documented: area (Right) - 14 minutes Total: area (Right) - 14 minutes   COMPLICATIONS:  None apparent  DISPOSITION:  Extubated, awake and stable to recovery.  DICTATION ID:  [id number]

## 2014-11-27 NOTE — Discharge Instructions (Signed)
Molly Simmer, MD [place]  Please read the following information regarding your care after surgery.  Medications  You only need a prescription for the narcotic pain medicine (ex. oxycodone, Percocet, Norco).  All of the other medicines listed below are available over the counter. X norco as prescribed for severe pain X Motrin or Alleve as needed for mild to moderate pain.  Narcotic pain medicine (ex. oxycodone, Percocet, Vicodin) will cause constipation.  To prevent this problem, take the following medicines while you are taking any pain medicine. X docusate sodium (Colace) 100 mg twice a day X senna (Senokot) 2 tablets twice a day  Resume your Coumadin (warfarin) today.  Weight Bearing X Bear weight only on your operated foot in the post-op shoe.  Cast / Splint / Dressing X Keep your dressings clean and dry.  Dont put anything (coat hanger, pencil, etc) down inside of it.  If it gets damp, use a hair dryer on the cool setting to dry it.  If it gets soaked, call the office to schedule an appointment for a dressing change.  After your dressing, cast or splint is removed; you may shower, but do not soak or scrub the wound.  Allow the water to run over it, and then gently pat it dry.  Swelling It is normal for you to have swelling where you had surgery.  To reduce swelling and pain, keep your toes above your nose for at least 3 days after surgery.  It may be necessary to keep your foot or leg elevated for several weeks.  If it hurts, it should be elevated.  Follow Up Call my office at [phone] when you are discharged from the hospital or surgery center to schedule an appointment to be seen two weeks after surgery.  Call my office at [phone] if you develop a fever >101.5 F, nausea, vomiting, bleeding from the surgical site or severe pain.    What to eat:  For your first meals, you should eat lightly; only small meals initially.  If you do not have nausea, you may  eat larger meals.  Avoid spicy, greasy and heavy food.    General Anesthesia, Adult, Care After  Refer to this sheet in the next few weeks. These instructions provide you with information on caring for yourself after your procedure. Your health care provider may also give you more specific instructions. Your treatment has been planned according to current medical practices, but problems sometimes occur. Call your health care provider if you have any problems or questions after your procedure.  WHAT TO EXPECT AFTER THE PROCEDURE  After the procedure, it is typical to experience:  Sleepiness.  Nausea and vomiting. HOME CARE INSTRUCTIONS  For the first 24 hours after general anesthesia:  Have a responsible person with you.  Do not drive a car. If you are alone, do not take public transportation.  Do not drink alcohol.  Do not take medicine that has not been prescribed by your health care provider.  Do not sign important papers or make important decisions.  You may resume a normal diet and activities as directed by your health care provider.  Change bandages (dressings) as directed.  If you have questions or problems that seem related to general anesthesia, call the hospital and ask for the anesthetist or anesthesiologist on call. SEEK MEDICAL CARE IF:  You have nausea and vomiting that continue the day after anesthesia.  You develop a rash. SEEK IMMEDIATE MEDICAL CARE IF:  You have  difficulty breathing.  You have chest pain.  You have any allergic problems. Document Released: 01/18/2001 Document Revised: 06/14/2013 Document Reviewed: 04/27/2013  [facility] Patient Information 2014 [place], [place].   Sore Throat    A sore throat is a painful, burning, sore, or scratchy feeling of the throat. There may be pain or tenderness when swallowing or talking. You may have other symptoms with a sore throat. These include coughing, sneezing, fever, or a swollen neck. A sore throat is often the first  sign of another sickness. These sicknesses may include a cold, flu, strep throat, or an infection called mono. Most sore throats go away without medical treatment.  HOME CARE  Only take medicine as told by your doctor.  Drink enough fluids to keep your pee (urine) clear or pale yellow.  Rest as needed.  Try using throat sprays, lozenges, or suck on hard candy (if older than 4 years or as told).  Sip warm liquids, such as broth, herbal tea, or warm water with honey. Try sucking on frozen ice pops or drinking cold liquids.  Rinse the mouth (gargle) with salt water. Mix 1 teaspoon salt with 8 ounces of water.  Do not smoke. Avoid being around others when they are smoking.  Put a humidifier in your bedroom at night to moisten the air. You can also turn on a hot shower and sit in the bathroom for 5-10 minutes. Be sure the bathroom door is closed. GET HELP RIGHT AWAY IF:  You have trouble breathing.  You cannot swallow fluids, soft foods, or your spit (saliva).  You have more puffiness (swelling) in the throat.  Your sore throat does not get better in 7 days.  You feel sick to your stomach (nauseous) and throw up (vomit).  You have a fever or lasting symptoms for more than 2-3 days.  You have a fever and your symptoms suddenly get worse. MAKE SURE YOU:  Understand these instructions.  Will watch your condition.  Will get help right away if you are not doing well or get worse. Document Released: 07/21/2008 Document Revised: 07/06/2012 Document Reviewed: 06/19/2012  [facility] Patient Information 2015 [place], [place]. This information is not intended to replace advice given to you by your health care provider. Make sure you discuss any questions you have with your health care provider.

## 2014-11-27 NOTE — Anesthesia Preprocedure Evaluation (Addendum)
Anesthesia Evaluation  Patient identified by MRN, date of birth, ID band Patient awake    Reviewed: Allergy & Precautions, NPO status , Patient's Chart, lab work & pertinent test results, reviewed documented beta blocker date and time   Airway Mallampati: II       Dental   Pulmonary  breath sounds clear to auscultation        Cardiovascular hypertension, Pt. on medications + dysrhythmias Atrial Fibrillation + pacemaker (checked often) Rhythm:Irregular     Neuro/Psych    GI/Hepatic Neg liver ROS, GERD-  Medicated,  Endo/Other  negative endocrine ROS  Renal/GU Renal InsufficiencyRenal diseaseGFR 50     Musculoskeletal   Abdominal (+)  Abdomen: soft.    Peds  Hematology   Anesthesia Other Findings   Reproductive/Obstetrics                            Anesthesia Physical Anesthesia Plan  ASA: III  Anesthesia Plan: General and MAC   Post-op Pain Management: MAC Combined w/ Regional for Post-op pain   Induction: Intravenous  Airway Management Planned:   Additional Equipment:   Intra-op Plan:   Post-operative Plan:   Informed Consent: I have reviewed the patients History and Physical, chart, labs and discussed the procedure including the risks, benefits and alternatives for the proposed anesthesia with the patient or authorized representative who has indicated his/her understanding and acceptance.     Plan Discussed with:   Anesthesia Plan Comments:         Anesthesia Quick Evaluation

## 2014-11-27 NOTE — Anesthesia Procedure Notes (Signed)
Procedure Name: LMA Insertion Date/Time: 11/27/2014 2:12 PM Performed by: Garner Nash Pre-anesthesia Checklist: Patient identified, Timeout performed, Emergency Drugs available, Suction available and Patient being monitored Patient Re-evaluated:Patient Re-evaluated prior to inductionOxygen Delivery Method: Circle system utilized Preoxygenation: Pre-oxygenation with 100% oxygen Intubation Type: IV induction LMA: LMA inserted LMA Size: 4.0 Number of attempts: 1 Placement Confirmation: positive ETCO2,  CO2 detector and breath sounds checked- equal and bilateral Tube secured with: Tape Dental Injury: Teeth and Oropharynx as per pre-operative assessment

## 2014-11-27 NOTE — Progress Notes (Signed)
Orthopedic Tech Progress Note Patient Details:  Molly [name] 1933-07-09 [id number]  Ortho Devices Type of Ortho Device: Postop shoe/boot Ortho Device/Splint Location: rle Ortho Device/Splint Interventions: Application   Hildred Priest 11/27/2014, 3:39 PM

## 2014-11-27 NOTE — Transfer of Care (Signed)
Immediate Anesthesia Transfer of Care Note  Patient: Molly [name]  Procedure(s) Performed: Procedure(s): RIGHT 2ND AND 3RD TOE AMPUTATION (Right)  Patient Location: PACU  Anesthesia Type:General  Level of Consciousness: awake, alert  and oriented  Airway & Oxygen Therapy: Patient Spontanous Breathing  Post-op Assessment: Report given to RN and Post -op Vital signs reviewed and stable  Post vital signs: Reviewed and stable  Last Vitals:  Filed Vitals:   11/27/14 1127  BP: 161/74  Pulse: 70  Temp: 36.4 C  Resp: 20    Complications: No apparent anesthesia complications

## 2014-11-28 NOTE — Op Note (Signed)
NAMEJONEL, SICK NO.:  [id number]  MEDICAL RECORD NO.:  [id number]  LOCATION:  [facility]                         FACILITY:  [facility]  PHYSICIAN:  Wylene Simmer, MD        DATE OF BIRTH:  1932-11-03  DATE OF PROCEDURE:  11/27/2014 DATE OF DISCHARGE:  11/27/2014                              OPERATIVE REPORT   PREOPERATIVE DIAGNOSIS:  Right second and third crossover toes.  POSTOPERATIVE DIAGNOSIS:  Right second and third crossover toes.  PROCEDURE:  Amputation of right second and third toes through the metatarsophalangeal joints.  SURGEON:  Wylene Simmer, MD  ANESTHESIA:  General.  ESTIMATED BLOOD LOSS:  Minimal.  TOURNIQUET TIME:  14 minutes with an ankle Esmarch.  COMPLICATIONS:  None apparent.  DISPOSITION:  Extubated, awake and stable to recovery.  INDICATIONS FOR PROCEDURE:  The patient is a 81 year old woman who has a history of painful right second and third hammertoe deformities.  These were progressed to crossover toe deformities on top of the hallux.  She had a similar problem on the left and underwent second and third toe amputations and has been happy with that result.  On the right, she presents now for second and third toe amputations.  She understands the risks and benefits, the alternative treatment options, and elects surgical treatment.  She specifically understands risks of bleeding, infection, nerve damage, blood clots, need for additional surgery, continued pain, revision amputation, and death.  PROCEDURE IN DETAIL:  After preoperative consent was obtained and the correct operative site was identified, the patient was brought to the operating room and placed supine on the operating table.  General anesthesia was induced.  Preoperative antibiotics were administered. Surgical time-out was taken.  The right lower extremity was prepped and draped in standard sterile fashion with the tourniquet around the thigh. The extremity was  exsanguinated and a 4-inch Esmarch tourniquet was wrapped around the ankle.  A racket style incision was marked on the skin adjacent to the bases of the second and third toes.  Incision was made.  Sharp dissection was carried down through the skin and subcutaneous tissue.  The second and third toes were then disarticulated through the MTP joints.  The second metatarsal head was still rather prominence and this was resected with the bone cutter.  The neurovascular bundles were cauterized.  The remaining soft tissue all appeared healthy.  The incision was closed with horizontal mattress and simple sutures of 3-0 nylon.  The incision site was then infiltrated with 0.5% Marcaine with epinephrine.  Sterile dressings were applied followed by compression wrap.  Tourniquet was released after application of the dressings at 14 minutes.  The patient was awakened from anesthesia and transported to the recovery room in stable condition.  FOLLOWUP PLAN:  The patient will be weightbearing as tolerated on her right foot in a flat postop shoe.  She will resume her blood thinners tonight and follow up with me in 2-3 weeks for suture removal.     Wylene Simmer, MD     JH/MEDQ  D:  11/27/2014  T:  11/28/2014  Job:  [id number]

## 2014-11-29 ENCOUNTER — Encounter (HOSPITAL_COMMUNITY): Payer: Self-pay | Admitting: Orthopedic Surgery

## 2014-11-29 ENCOUNTER — Telehealth: Payer: Self-pay | Admitting: Internal Medicine

## 2014-11-29 NOTE — Telephone Encounter (Signed)
Informed patient that we're not receiving her transmissions since her monitor is not assigned in [employer]. She states that she will have her husband to call back in the am with the model and SN.

## 2014-11-29 NOTE — Telephone Encounter (Signed)
°  1. Has your device fired? No  2. Is you device beeping? No  3. Are you experiencing draining or swelling at device site? No  4. Are you calling to see if we received your device transmission? yes  5. Have you passed out? No  New message  Pt called, request a call back to determine if her remote device is sending signals correctly please call

## 2014-11-30 NOTE — Telephone Encounter (Signed)
Pt has a CHADS score of 2 and no history of TIA/CVA- okay to hold Coumadin x 5 days prior to procedure.

## 2014-12-04 NOTE — H&P (Signed)
Molly [name] is an 81 y.o. female.   Chief Complaint: right foot pain HPI: 81 y/o female with PMH of TIA c/o chronic pain at her right foot crossover toe deformity.  Both the 2nd and 3rd toes cross over the hallux and have clawtoe defor mity as well.  We have discussed reconstruction of the deformity as well as amputation of the two painful toes.  SHe elected amputation9 for the same deformity of the left foot and is happy with the results.  She elects amputation of the right 2nd and 3rd toes to treat this painful deformity.  SHe has been off of coumadin for a few days.  Past Medical History  Diagnosis Date  . Hypertension   . Atrial fibrillation     paroxysmal  . High cholesterol   . Hyperparathyroidism   . GERD (gastroesophageal reflux disease)   . PAF (paroxysmal atrial fibrillation)   . H/O hyperkalemia     Resolved off ACE inhibitors  . Renal insufficiency     MILD  . LV dysfunction     MILD-EF40-45% 2003 now EF 50-55%  . H/O benign essential tremor     on amiodarone resolved on lower dose of amio  . PVC's (premature ventricular contractions)   . Tachycardia-bradycardia syndrome     s/p PPM 02/2013  . Dysrhythmia     PAF  . Complication of anesthesia   . PONV (postoperative nausea and vomiting)   . Shortness of breath     with exertion  . History of blood transfusion   . Cancer     Skin cancer- basal.  1 mylenoma    Past Surgical History  Procedure Laterality Date  . Shoulder surgery Right     roto cuff  . Cardiac catheterization  09/25/2000    EF of 50% -- with normal left ventricular size and function  . Cesarean section    . Cesarean section    . Tonsillectomy    . Shoulder arthroscopy w/ rotator cuff repair    . Bunionectomy      Right  . Varicose vein surgery    . Foot surgery    . Insert / replace / remove pacemaker  02/2013  . Pacemaker insertion  02/2013  . Permanent pacemaker insertion N/A 03/02/2013    Procedure: PERMANENT PACEMAKER INSERTION;   Surgeon: Evans Lance, MD;  Location: [facility] CATH LAB;  Service: Cardiovascular;  Laterality: N/A;  . Eye surgery Bilateral     Cataract  . Toe amputation Left     2nd and 3rd, bunion under toes.  . Amputation Right 11/27/2014    Procedure: RIGHT 2ND AND 3RD TOE AMPUTATION;  Surgeon: Wylene Simmer, MD;  Location: [facility];  Service: Orthopedics;  Laterality: Right;    Family History  Problem Relation Age of Onset  . Hypertension Mother   . Hypertension Father    Social History:  reports that she has never smoked. She has never used smokeless tobacco. She reports that she does not drink alcohol or use illicit drugs.  Allergies:  Allergies  Allergen Reactions  . Tramadol Nausea Only    No prescriptions prior to admission    No results found for this or any previous visit (from the past 48 hour(s)). No results found.  ROS  No recent f/c/n/v/wt loss.  Blood pressure 150/61, pulse 71, temperature 97.8 F (36.6 C), temperature source Oral, resp. rate 11, weight 79.379 kg (175 lb), SpO2 95 %. Physical Exam  wn wd woman  in nad.  A and O x 4.  Mood and affect normal.  EOmi.  RESP unlabored.  R foot with 2nd and 3rd crossover toes.  NVI.  No callouses.    Assessment/Plan R 2nd and 3rd crossover toes.  To OR For right 2nd and 3rd toe amputations.  The risks and benefits of the alternative treatment options have been discussed in detail.  The patient wishes to proceed with surgery and specifically understands risks of bleeding, infection, nerve damage, blood clots, need for additional surgery, amputation and death.   Wylene Simmer 15-Dec-2014, 8:12 AM

## 2014-12-13 DIAGNOSIS — Z89421 Acquired absence of other right toe(s): Secondary | ICD-10-CM | POA: Diagnosis not present

## 2014-12-13 DIAGNOSIS — M205X1 Other deformities of toe(s) (acquired), right foot: Secondary | ICD-10-CM | POA: Diagnosis not present

## 2014-12-13 DIAGNOSIS — Z4781 Encounter for orthopedic aftercare following surgical amputation: Secondary | ICD-10-CM | POA: Diagnosis not present

## 2014-12-17 ENCOUNTER — Ambulatory Visit (INDEPENDENT_AMBULATORY_CARE_PROVIDER_SITE_OTHER): Payer: Medicare Other

## 2014-12-17 DIAGNOSIS — I4891 Unspecified atrial fibrillation: Secondary | ICD-10-CM

## 2014-12-17 LAB — POCT INR: INR: 2.3

## 2014-12-25 ENCOUNTER — Encounter: Payer: Medicare Other | Admitting: *Deleted

## 2014-12-25 ENCOUNTER — Telehealth: Payer: Self-pay | Admitting: Cardiology

## 2014-12-25 NOTE — Telephone Encounter (Signed)
Spoke with pt and reminded pt of remote transmission that is due today. Pt verbalized understanding.   

## 2014-12-26 ENCOUNTER — Encounter: Payer: Self-pay | Admitting: Cardiology

## 2014-12-26 DIAGNOSIS — Z89421 Acquired absence of other right toe(s): Secondary | ICD-10-CM | POA: Diagnosis not present

## 2014-12-26 DIAGNOSIS — R269 Unspecified abnormalities of gait and mobility: Secondary | ICD-10-CM | POA: Diagnosis not present

## 2014-12-26 DIAGNOSIS — Z4781 Encounter for orthopedic aftercare following surgical amputation: Secondary | ICD-10-CM | POA: Diagnosis not present

## 2014-12-31 ENCOUNTER — Telehealth: Payer: Self-pay | Admitting: Internal Medicine

## 2014-12-31 NOTE — Telephone Encounter (Signed)
Spoke w/pt to let know transmissions are not coming thru at this time. Pt was advised to call [employer] and troubleshoot.

## 2014-12-31 NOTE — Telephone Encounter (Signed)
New message      Pt received a new box.  Are we getting transmissions now?

## 2015-01-02 ENCOUNTER — Encounter: Payer: Self-pay | Admitting: Internal Medicine

## 2015-01-02 ENCOUNTER — Ambulatory Visit (INDEPENDENT_AMBULATORY_CARE_PROVIDER_SITE_OTHER): Payer: Medicare Other | Admitting: *Deleted

## 2015-01-02 DIAGNOSIS — I495 Sick sinus syndrome: Secondary | ICD-10-CM | POA: Diagnosis not present

## 2015-01-02 LAB — MDC_IDC_ENUM_SESS_TYPE_REMOTE
Battery Remaining Longevity: 144 mo
Battery Remaining Percentage: 100 %
Brady Statistic RV Percent Paced: 12 %
Date Time Interrogation Session: 20160309131100
Lead Channel Impedance Value: 670 Ohm
Lead Channel Pacing Threshold Amplitude: 1 V
Lead Channel Pacing Threshold Pulse Width: 0.4 ms
Lead Channel Setting Pacing Amplitude: 2 V
Lead Channel Setting Pacing Pulse Width: 0.4 ms
MDC IDC MSMT LEADCHNL RV IMPEDANCE VALUE: 690 Ohm
MDC IDC PG SERIAL: 112392
MDC IDC SET LEADCHNL RV PACING AMPLITUDE: 2.4 V
MDC IDC SET LEADCHNL RV SENSING SENSITIVITY: 2.5 mV
MDC IDC STAT BRADY RA PERCENT PACED: 91 %
Zone Setting Detection Interval: 375 ms

## 2015-01-03 NOTE — Progress Notes (Signed)
Remote pacemaker transmission.   

## 2015-01-07 DIAGNOSIS — H401432 Capsular glaucoma with pseudoexfoliation of lens, bilateral, moderate stage: Secondary | ICD-10-CM | POA: Diagnosis not present

## 2015-01-22 ENCOUNTER — Encounter: Payer: Self-pay | Admitting: Cardiology

## 2015-01-22 NOTE — Telephone Encounter (Signed)
This encounter was created in error - please disregard.

## 2015-01-29 ENCOUNTER — Ambulatory Visit (INDEPENDENT_AMBULATORY_CARE_PROVIDER_SITE_OTHER): Payer: Medicare Other | Admitting: *Deleted

## 2015-01-29 DIAGNOSIS — I480 Paroxysmal atrial fibrillation: Secondary | ICD-10-CM | POA: Diagnosis not present

## 2015-01-29 DIAGNOSIS — Z5181 Encounter for therapeutic drug level monitoring: Secondary | ICD-10-CM

## 2015-01-29 DIAGNOSIS — I4891 Unspecified atrial fibrillation: Secondary | ICD-10-CM

## 2015-01-29 LAB — POCT INR: INR: 2.3

## 2015-01-30 ENCOUNTER — Encounter: Payer: Self-pay | Admitting: Cardiology

## 2015-02-05 ENCOUNTER — Ambulatory Visit: Payer: Medicare Other | Admitting: Cardiology

## 2015-02-18 DIAGNOSIS — D0439 Carcinoma in situ of skin of other parts of face: Secondary | ICD-10-CM | POA: Diagnosis not present

## 2015-02-18 DIAGNOSIS — L578 Other skin changes due to chronic exposure to nonionizing radiation: Secondary | ICD-10-CM | POA: Diagnosis not present

## 2015-02-18 DIAGNOSIS — L821 Other seborrheic keratosis: Secondary | ICD-10-CM | POA: Diagnosis not present

## 2015-02-18 DIAGNOSIS — L570 Actinic keratosis: Secondary | ICD-10-CM | POA: Diagnosis not present

## 2015-02-18 DIAGNOSIS — D692 Other nonthrombocytopenic purpura: Secondary | ICD-10-CM | POA: Diagnosis not present

## 2015-02-18 DIAGNOSIS — Z85828 Personal history of other malignant neoplasm of skin: Secondary | ICD-10-CM | POA: Diagnosis not present

## 2015-02-18 DIAGNOSIS — D485 Neoplasm of uncertain behavior of skin: Secondary | ICD-10-CM | POA: Diagnosis not present

## 2015-02-26 ENCOUNTER — Other Ambulatory Visit: Payer: Self-pay | Admitting: Cardiology

## 2015-02-27 ENCOUNTER — Other Ambulatory Visit: Payer: Self-pay

## 2015-02-27 MED ORDER — SOTALOL HCL 80 MG PO TABS: 80.0000 mg | ORAL_TABLET | Freq: Two times a day (BID) | ORAL | Status: DC

## 2015-03-12 ENCOUNTER — Ambulatory Visit (INDEPENDENT_AMBULATORY_CARE_PROVIDER_SITE_OTHER): Payer: Medicare Other | Admitting: Internal Medicine

## 2015-03-12 ENCOUNTER — Ambulatory Visit (INDEPENDENT_AMBULATORY_CARE_PROVIDER_SITE_OTHER): Payer: Medicare Other | Admitting: *Deleted

## 2015-03-12 ENCOUNTER — Encounter: Payer: Self-pay | Admitting: Internal Medicine

## 2015-03-12 DIAGNOSIS — Z5181 Encounter for therapeutic drug level monitoring: Secondary | ICD-10-CM

## 2015-03-12 DIAGNOSIS — I495 Sick sinus syndrome: Secondary | ICD-10-CM

## 2015-03-12 DIAGNOSIS — R001 Bradycardia, unspecified: Secondary | ICD-10-CM

## 2015-03-12 DIAGNOSIS — I493 Ventricular premature depolarization: Secondary | ICD-10-CM

## 2015-03-12 DIAGNOSIS — I480 Paroxysmal atrial fibrillation: Secondary | ICD-10-CM | POA: Diagnosis not present

## 2015-03-12 DIAGNOSIS — I4891 Unspecified atrial fibrillation: Secondary | ICD-10-CM | POA: Diagnosis not present

## 2015-03-12 LAB — CUP PACEART INCLINIC DEVICE CHECK
Date Time Interrogation Session: 20160517040000
Lead Channel Impedance Value: 645 Ohm
Lead Channel Pacing Threshold Amplitude: 1 V
Lead Channel Sensing Intrinsic Amplitude: 15.8 mV
Lead Channel Sensing Intrinsic Amplitude: 2.7 mV
Lead Channel Setting Pacing Amplitude: 2.4 V
Lead Channel Setting Pacing Pulse Width: 0.4 ms
Lead Channel Setting Sensing Sensitivity: 2.5 mV
MDC IDC MSMT LEADCHNL RA PACING THRESHOLD PULSEWIDTH: 0.4 ms
MDC IDC MSMT LEADCHNL RV IMPEDANCE VALUE: 669 Ohm
MDC IDC MSMT LEADCHNL RV PACING THRESHOLD AMPLITUDE: 0.7 V
MDC IDC MSMT LEADCHNL RV PACING THRESHOLD PULSEWIDTH: 0.4 ms
MDC IDC SET LEADCHNL RA PACING AMPLITUDE: 2 V
Pulse Gen Serial Number: 112392
Zone Setting Detection Interval: 375 ms

## 2015-03-12 LAB — POCT INR: INR: 1.8

## 2015-03-12 NOTE — Progress Notes (Signed)
HPI Molly [name] returns today for followup. She is a very pleasant 81 year old woman with a history of frequent PVCs, and atrial fibrillation. The patient has been on multiple medications in attempt to control her arrhythmias.  She has been placed on sotalol.  She had fatigue with calcium channel blockers. In the interim she notes fatigue and weakness and palpitations are improved. She returns today for followup. No syncope, and no chest pain. She has minimal dyspnea with exertion and peripheral edema. She admits to being a bit more sedentary.  Allergies  Allergen Reactions  . Tramadol Nausea Only     Current Outpatient Prescriptions  Medication Sig Dispense Refill  . acetaminophen (TYLENOL) 500 MG tablet Take 1,000 mg by mouth every 6 (six) hours as needed. For pain    . atorvastatin (LIPITOR) 10 MG tablet Take 10 mg by mouth every evening.    . dorzolamide (TRUSOPT) 2 % ophthalmic solution Place 1 drop into the right eye 2 (two) times daily.    . furosemide (LASIX) 20 MG tablet Take 20 mg by mouth daily.    Marland Kitchen HYDROcodone-acetaminophen (NORCO) 5-325 MG per tablet Take 1 tablet by mouth every 6 (six) hours as needed for moderate pain. 15 tablet 0  . latanoprost (XALATAN) 0.005 % ophthalmic solution Place 1 drop into the right eye at bedtime.    Marland Kitchen LORazepam (ATIVAN) 0.5 MG tablet Take 0.25 mg by mouth at bedtime.    . Multiple Vitamin (MULTIVITAMIN WITH MINERALS) TABS Take 1 tablet by mouth daily.    Marland Kitchen omeprazole (PRILOSEC) 20 MG capsule Take 20 mg by mouth daily.    . sotalol (BETAPACE) 80 MG tablet Take 1 tablet (80 mg total) by mouth every 12 (twelve) hours. 60 tablet 3  . warfarin (COUMADIN) 5 MG tablet TAKE AS DIRECTED BY ANTICOAGULATION CLINIC 30 tablet 3   No current facility-administered medications for this visit.     Past Medical History  Diagnosis Date  . Hypertension   . Atrial fibrillation     paroxysmal  . High cholesterol   . Hyperparathyroidism   . GERD  (gastroesophageal reflux disease)   . PAF (paroxysmal atrial fibrillation)   . H/O hyperkalemia     Resolved off ACE inhibitors  . Renal insufficiency     MILD  . LV dysfunction     MILD-EF40-45% 2003 now EF 50-55%  . H/O benign essential tremor     on amiodarone resolved on lower dose of amio  . PVC's (premature ventricular contractions)   . Tachycardia-bradycardia syndrome     s/p PPM 02/2013  . Dysrhythmia     PAF  . Complication of anesthesia   . PONV (postoperative nausea and vomiting)   . Shortness of breath     with exertion  . History of blood transfusion   . Cancer     Skin cancer- basal.  1 mylenoma    ROS:   All systems reviewed and negative except as noted in the HPI.   Past Surgical History  Procedure Laterality Date  . Shoulder surgery Right     roto cuff  . Cardiac catheterization  09/25/2000    EF of 50% -- with normal left ventricular size and function  . Cesarean section    . Cesarean section    . Tonsillectomy    . Shoulder arthroscopy w/ rotator cuff repair    . Bunionectomy      Right  . Varicose vein surgery    . Foot surgery    .  Insert / replace / remove pacemaker  02/2013  . Pacemaker insertion  02/2013  . Permanent pacemaker insertion N/A 03/02/2013    Procedure: PERMANENT PACEMAKER INSERTION;  Surgeon: Evans Lance, MD;  Location: [facility] CATH LAB;  Service: Cardiovascular;  Laterality: N/A;  . Eye surgery Bilateral     Cataract  . Toe amputation Left     2nd and 3rd, bunion under toes.  . Amputation Right 11/27/2014    Procedure: RIGHT 2ND AND 3RD TOE AMPUTATION;  Surgeon: Wylene Simmer, MD;  Location: [facility];  Service: Orthopedics;  Laterality: Right;     Family History  Problem Relation Age of Onset  . Hypertension Mother   . Hypertension Father      History   Social History  . Marital Status: Married    Spouse Name: N/A  . Number of Children: N/A  . Years of Education: N/A   Occupational History  . Not on file.   Social  History Main Topics  . Smoking status: Never Smoker   . Smokeless tobacco: Never Used  . Alcohol Use: No  . Drug Use: No  . Sexual Activity: Not on file   Other Topics Concern  . Not on file   Social History Narrative     BP 137/86 mmHg  Pulse 70  Ht 5\' 4"  (1.626 m)  Wt 178 lb 12.8 oz (81.103 kg)  BMI 30.68 kg/m2  Physical Exam:  Well appearing 81 year old woman,NAD HEENT: Unremarkable Neck:  No JVD, no thyromegally Back:  No CVA tenderness Lungs:  Clear with no wheezes HEART:  Regular rate rhythm, no murmurs, no rubs, no clicks Abd:  soft, positive bowel sounds, no organomegally, no rebound, no guarding Ext:  2 plus pulses, no edema, no cyanosis, no clubbing Skin:  No rashes no nodules Neuro:  CN II through XII intact, motor grossly intact   DEVICE  Normal device function.  See PaceArt for details.   Assess/Plan:

## 2015-03-12 NOTE — Assessment & Plan Note (Signed)
Her [facility] sci device is working normally. Will recheck in several months.

## 2015-03-12 NOTE — Assessment & Plan Note (Signed)
She is maintaining NSR approx. 99% of the time. She will continue her sotalol.

## 2015-03-12 NOTE — Assessment & Plan Note (Signed)
She is minimally symptomatic. She has occaisional episodes of palpitations. Will follow.

## 2015-03-12 NOTE — Patient Instructions (Signed)
Medication Instructions:  Your physician recommends that you continue on your current medications as directed. Please refer to the Current Medication list given to you today.   Labwork: None ordered  Testing/Procedures: None ordered  Follow-Up: Your physician wants you to follow-up in: 12 months with Dr Knox Saliva will receive a reminder letter in the mail two months in advance. If you don't receive a letter, please call our office to schedule the follow-up appointment.  Remote monitoring is used to monitor your Pacemaker or ICD from home. This monitoring reduces the number of office visits required to check your device to one time per year. It allows [place] to keep an eye on the functioning of your device to ensure it is working properly. You are scheduled for a device check from home on 06/11/15. You may send your transmission at any time that day. If you have a wireless device, the transmission will be sent automatically. After your physician reviews your transmission, you will receive a postcard with your next transmission date.    Any Other Special Instructions Will Be Listed Below (If Applicable).

## 2015-03-19 DIAGNOSIS — Z950 Presence of cardiac pacemaker: Secondary | ICD-10-CM | POA: Diagnosis not present

## 2015-03-19 DIAGNOSIS — K219 Gastro-esophageal reflux disease without esophagitis: Secondary | ICD-10-CM | POA: Diagnosis not present

## 2015-03-19 DIAGNOSIS — F3340 Major depressive disorder, recurrent, in remission, unspecified: Secondary | ICD-10-CM | POA: Diagnosis not present

## 2015-03-19 DIAGNOSIS — F419 Anxiety disorder, unspecified: Secondary | ICD-10-CM | POA: Diagnosis not present

## 2015-03-19 DIAGNOSIS — I4891 Unspecified atrial fibrillation: Secondary | ICD-10-CM | POA: Diagnosis not present

## 2015-03-19 DIAGNOSIS — E782 Mixed hyperlipidemia: Secondary | ICD-10-CM | POA: Diagnosis not present

## 2015-03-19 DIAGNOSIS — R609 Edema, unspecified: Secondary | ICD-10-CM | POA: Diagnosis not present

## 2015-03-19 DIAGNOSIS — N183 Chronic kidney disease, stage 3 (moderate): Secondary | ICD-10-CM | POA: Diagnosis not present

## 2015-03-19 DIAGNOSIS — I129 Hypertensive chronic kidney disease with stage 1 through stage 4 chronic kidney disease, or unspecified chronic kidney disease: Secondary | ICD-10-CM | POA: Diagnosis not present

## 2015-03-25 NOTE — Progress Notes (Signed)
Cardiology Office Note   Date:  03/26/2015   ID:  Molly [name], DOB 05/04/1933, MRN [id number]  PCP:  Molly Kroner, MD    Chief Complaint  Patient presents with  . Follow-up    essential hypertension, atrial fibrillation, PPM      History of Present Illness: Molly [name] is a 81 y.o. female with a history of paroxysmal atrial fibrillation on chronic systemic anticoagulation, HTN, symptomatic PVC's, low normal LVF EF 50-55%, tachybrady syndrome s/p PPM who presents today for followup. She is doing well. She denies any chest pain, SOB, DOE, dizziness or syncope. She occasionally has some [name] edema which is fairly controlled on Lasix.  She occasionally has some skipped heart beats.  She recently had a pacer check with Dr. Lovena [name] and was noted to have some increased frequency of PAF (1%) which she mainly notices at night but Dr. Lovena [name] did not want to change her Sotolol dose.       Past Medical History  Diagnosis Date  . Hypertension   . Atrial fibrillation     paroxysmal  . High cholesterol   . Hyperparathyroidism   . GERD (gastroesophageal reflux disease)   . PAF (paroxysmal atrial fibrillation)   . H/O hyperkalemia     Resolved off ACE inhibitors  . Renal insufficiency     MILD  . LV dysfunction     MILD-EF40-45% 2003 now EF 50-55%  . H/O benign essential tremor     on amiodarone resolved on lower dose of amio  . PVC's (premature ventricular contractions)   . Tachycardia-bradycardia syndrome     s/p PPM 02/2013  . Dysrhythmia     PAF  . Complication of anesthesia   . PONV (postoperative nausea and vomiting)   . Shortness of breath     with exertion  . History of blood transfusion   . Cancer     Skin cancer- basal.  1 mylenoma    Past Surgical History  Procedure Laterality Date  . Shoulder surgery Right     roto cuff  . Cardiac catheterization  09/25/2000    EF of 50% -- with normal left ventricular size and function  . Cesarean  section    . Cesarean section    . Tonsillectomy    . Shoulder arthroscopy w/ rotator cuff repair    . Bunionectomy      Right  . Varicose vein surgery    . Foot surgery    . Insert / replace / remove pacemaker  02/2013  . Pacemaker insertion  02/2013  . Permanent pacemaker insertion N/A 03/02/2013    Procedure: PERMANENT PACEMAKER INSERTION;  Surgeon: Evans Lance, MD;  Location: [facility] CATH LAB;  Service: Cardiovascular;  Laterality: N/A;  . Eye surgery Bilateral     Cataract  . Toe amputation Left     2nd and 3rd, bunion under toes.  . Amputation Right 11/27/2014    Procedure: RIGHT 2ND AND 3RD TOE AMPUTATION;  Surgeon: Wylene Simmer, MD;  Location: [facility];  Service: Orthopedics;  Laterality: Right;     Current Outpatient Prescriptions  Medication Sig Dispense Refill  . acetaminophen (TYLENOL) 500 MG tablet Take 1,000 mg by mouth every 6 (six) hours as needed. For pain    . atorvastatin (LIPITOR) 10 MG tablet Take 10 mg by mouth every evening.    . dorzolamide (TRUSOPT) 2 % ophthalmic solution  Place 1 drop into the right eye 2 (two) times daily.    . furosemide (LASIX) 20 MG tablet Take 20 mg by mouth daily.    Marland Kitchen latanoprost (XALATAN) 0.005 % ophthalmic solution Place 1 drop into the right eye at bedtime.    Marland Kitchen LORazepam (ATIVAN) 1 MG tablet Take 1 mg by mouth daily. Pt takes 1/2 tablet by mouth daily    . Multiple Vitamin (MULTIVITAMIN WITH MINERALS) TABS Take 1 tablet by mouth daily.    Marland Kitchen omeprazole (PRILOSEC) 20 MG capsule Take 20 mg by mouth daily.    . sotalol (BETAPACE) 80 MG tablet Take 1 tablet (80 mg total) by mouth every 12 (twelve) hours. 60 tablet 3  . warfarin (COUMADIN) 5 MG tablet TAKE AS DIRECTED BY ANTICOAGULATION CLINIC 30 tablet 3   No current facility-administered medications for this visit.    Allergies:   Tramadol    Social History:  The patient  reports that she has never smoked. She has never used smokeless tobacco. She reports that she does not drink alcohol or  use illicit drugs.   Family History:  The patient's family history includes Hypertension in her father and mother.    ROS:  Please see the history of present illness.   Otherwise, review of systems are positive for muscle and back pain.   All other systems are reviewed and negative.    PHYSICAL EXAM: VS:  BP 140/90 mmHg  Pulse 70  Ht 5\' 4"  (1.626 m)  Wt 179 lb (81.194 kg)  BMI 30.71 kg/m2  SpO2 97% , BMI Body mass index is 30.71 kg/(m^2). GEN: Well nourished, well developed, in no acute distress HEENT: normal Neck: no JVD, carotid bruits, or masses Cardiac: RRR; no murmurs, rubs, or gallops,no edema  Respiratory:  clear to auscultation bilaterally, normal work of breathing GI: soft, nontender, nondistended, + BS MS: no deformity or atrophy Skin: warm and dry, no rash Neuro:  Strength and sensation are intact Psych: euthymic mood, full affect   EKG:  EKG is not ordered today.    Recent Labs: 11/26/2014: BUN 23; Creatinine 1.09; Hemoglobin 13.1; Platelets 191; Potassium 4.8; Sodium 139    Lipid Panel    Component Value Date/Time   CHOL 156 05/23/2012 0514   TRIG 86 05/23/2012 0514   HDL 80 05/23/2012 0514   CHOLHDL 2.0 05/23/2012 0514   VLDL 17 05/23/2012 0514   LDLCALC 59 05/23/2012 0514      Wt Readings from Last 3 Encounters:  03/26/15 179 lb (81.194 kg)  03/12/15 178 lb 12.8 oz (81.103 kg)  11/27/14 175 lb (79.379 kg)    ASSESSMENT AND PLAN:  1. Paroxysmal atrial fibrillation (1% PAF breakthrough on pacer check) - continue Warfarin/sotolol  2. Chronic systemic anticoagulation 4. HTN - borderline today but she has had white coat HTN in the past. On average at home it runs around 90/70-140/77mmhg. 5. Symptomatic PVC's - resolved 6. Tachybrady syndrome s/p PPM    Current medicines are reviewed at length with the patient today.  The patient does not have concerns regarding medicines.  The following changes have been made:  no change  Labs/ tests ordered  today: See above Assessment and Plan No orders of the defined types were placed in this encounter.     Disposition:   FU with me in 6 months  Signed, Sueanne Margarita, MD  03/26/2015 3:44 PM    [facility] [address], [address], [place]  [address] Phone: [phone]; Fax: [phone]

## 2015-03-26 ENCOUNTER — Ambulatory Visit (INDEPENDENT_AMBULATORY_CARE_PROVIDER_SITE_OTHER): Payer: Medicare Other | Admitting: Cardiology

## 2015-03-26 ENCOUNTER — Ambulatory Visit (INDEPENDENT_AMBULATORY_CARE_PROVIDER_SITE_OTHER): Payer: Medicare Other

## 2015-03-26 ENCOUNTER — Encounter: Payer: Self-pay | Admitting: Cardiology

## 2015-03-26 VITALS — BP 140/90 | HR 70 | Ht 64.0 in | Wt 179.0 lb

## 2015-03-26 DIAGNOSIS — I10 Essential (primary) hypertension: Secondary | ICD-10-CM

## 2015-03-26 DIAGNOSIS — I495 Sick sinus syndrome: Secondary | ICD-10-CM

## 2015-03-26 DIAGNOSIS — I480 Paroxysmal atrial fibrillation: Secondary | ICD-10-CM

## 2015-03-26 DIAGNOSIS — Z5181 Encounter for therapeutic drug level monitoring: Secondary | ICD-10-CM

## 2015-03-26 DIAGNOSIS — I493 Ventricular premature depolarization: Secondary | ICD-10-CM | POA: Diagnosis not present

## 2015-03-26 DIAGNOSIS — I4891 Unspecified atrial fibrillation: Secondary | ICD-10-CM

## 2015-03-26 DIAGNOSIS — Z950 Presence of cardiac pacemaker: Secondary | ICD-10-CM

## 2015-03-26 LAB — POCT INR: INR: 2.4

## 2015-03-26 NOTE — Patient Instructions (Signed)

## 2015-04-01 DIAGNOSIS — M545 Low back pain: Secondary | ICD-10-CM | POA: Diagnosis not present

## 2015-04-01 DIAGNOSIS — M41126 Adolescent idiopathic scoliosis, lumbar region: Secondary | ICD-10-CM | POA: Diagnosis not present

## 2015-04-15 DIAGNOSIS — G8929 Other chronic pain: Secondary | ICD-10-CM | POA: Diagnosis not present

## 2015-04-15 DIAGNOSIS — M545 Low back pain: Secondary | ICD-10-CM | POA: Diagnosis not present

## 2015-04-18 DIAGNOSIS — G8929 Other chronic pain: Secondary | ICD-10-CM | POA: Diagnosis not present

## 2015-04-18 DIAGNOSIS — M545 Low back pain: Secondary | ICD-10-CM | POA: Diagnosis not present

## 2015-04-30 DIAGNOSIS — G8929 Other chronic pain: Secondary | ICD-10-CM | POA: Diagnosis not present

## 2015-04-30 DIAGNOSIS — M545 Low back pain: Secondary | ICD-10-CM | POA: Diagnosis not present

## 2015-05-01 ENCOUNTER — Ambulatory Visit (INDEPENDENT_AMBULATORY_CARE_PROVIDER_SITE_OTHER): Payer: Medicare Other | Admitting: *Deleted

## 2015-05-01 DIAGNOSIS — Z5181 Encounter for therapeutic drug level monitoring: Secondary | ICD-10-CM

## 2015-05-01 DIAGNOSIS — I4891 Unspecified atrial fibrillation: Secondary | ICD-10-CM | POA: Diagnosis not present

## 2015-05-01 DIAGNOSIS — I480 Paroxysmal atrial fibrillation: Secondary | ICD-10-CM

## 2015-05-01 LAB — POCT INR: INR: 2.6

## 2015-05-02 DIAGNOSIS — G8929 Other chronic pain: Secondary | ICD-10-CM | POA: Diagnosis not present

## 2015-05-02 DIAGNOSIS — M545 Low back pain: Secondary | ICD-10-CM | POA: Diagnosis not present

## 2015-05-08 DIAGNOSIS — M545 Low back pain: Secondary | ICD-10-CM | POA: Diagnosis not present

## 2015-05-08 DIAGNOSIS — G8929 Other chronic pain: Secondary | ICD-10-CM | POA: Diagnosis not present

## 2015-05-15 DIAGNOSIS — G8929 Other chronic pain: Secondary | ICD-10-CM | POA: Diagnosis not present

## 2015-05-15 DIAGNOSIS — M545 Low back pain: Secondary | ICD-10-CM | POA: Diagnosis not present

## 2015-06-05 ENCOUNTER — Ambulatory Visit (INDEPENDENT_AMBULATORY_CARE_PROVIDER_SITE_OTHER): Payer: Medicare Other | Admitting: *Deleted

## 2015-06-05 DIAGNOSIS — Z5181 Encounter for therapeutic drug level monitoring: Secondary | ICD-10-CM | POA: Diagnosis not present

## 2015-06-05 DIAGNOSIS — I4891 Unspecified atrial fibrillation: Secondary | ICD-10-CM | POA: Diagnosis not present

## 2015-06-05 DIAGNOSIS — I480 Paroxysmal atrial fibrillation: Secondary | ICD-10-CM | POA: Diagnosis not present

## 2015-06-05 LAB — POCT INR: INR: 1.9

## 2015-06-11 ENCOUNTER — Ambulatory Visit (INDEPENDENT_AMBULATORY_CARE_PROVIDER_SITE_OTHER): Payer: Medicare Other | Admitting: *Deleted

## 2015-06-11 DIAGNOSIS — I495 Sick sinus syndrome: Secondary | ICD-10-CM | POA: Diagnosis not present

## 2015-06-11 NOTE — Progress Notes (Signed)
Remote pacemaker transmission.   

## 2015-06-21 LAB — CUP PACEART REMOTE DEVICE CHECK
Battery Remaining Longevity: 138 mo
Battery Remaining Percentage: 100 %
Brady Statistic RV Percent Paced: 15 %
Lead Channel Impedance Value: 636 Ohm
Lead Channel Sensing Intrinsic Amplitude: 13.3 mV
Lead Channel Setting Pacing Amplitude: 2 V
Lead Channel Setting Pacing Amplitude: 2.4 V
Lead Channel Setting Sensing Sensitivity: 2.5 mV
MDC IDC MSMT LEADCHNL RA IMPEDANCE VALUE: 624 Ohm
MDC IDC SESS DTM: 20160816073300
MDC IDC SET LEADCHNL RV PACING PULSEWIDTH: 0.4 ms
MDC IDC STAT BRADY RA PERCENT PACED: 94 %
Pulse Gen Serial Number: 112392
Zone Setting Detection Interval: 375 ms

## 2015-06-27 ENCOUNTER — Encounter: Payer: Self-pay | Admitting: Cardiology

## 2015-06-28 ENCOUNTER — Other Ambulatory Visit: Payer: Self-pay | Admitting: Cardiology

## 2015-07-10 ENCOUNTER — Ambulatory Visit (INDEPENDENT_AMBULATORY_CARE_PROVIDER_SITE_OTHER): Payer: Medicare Other | Admitting: *Deleted

## 2015-07-10 DIAGNOSIS — I4891 Unspecified atrial fibrillation: Secondary | ICD-10-CM

## 2015-07-10 DIAGNOSIS — I480 Paroxysmal atrial fibrillation: Secondary | ICD-10-CM

## 2015-07-10 DIAGNOSIS — Z5181 Encounter for therapeutic drug level monitoring: Secondary | ICD-10-CM | POA: Diagnosis not present

## 2015-07-10 LAB — POCT INR: INR: 2.3

## 2015-07-11 ENCOUNTER — Encounter: Payer: Self-pay | Admitting: Internal Medicine

## 2015-07-15 DIAGNOSIS — H4011X2 Primary open-angle glaucoma, moderate stage: Secondary | ICD-10-CM | POA: Diagnosis not present

## 2015-07-15 DIAGNOSIS — H52203 Unspecified astigmatism, bilateral: Secondary | ICD-10-CM | POA: Diagnosis not present

## 2015-07-15 DIAGNOSIS — Z961 Presence of intraocular lens: Secondary | ICD-10-CM | POA: Diagnosis not present

## 2015-07-15 DIAGNOSIS — H47021 Hemorrhage in optic nerve sheath, right eye: Secondary | ICD-10-CM | POA: Diagnosis not present

## 2015-07-31 DIAGNOSIS — H401412 Capsular glaucoma with pseudoexfoliation of lens, right eye, moderate stage: Secondary | ICD-10-CM | POA: Diagnosis not present

## 2015-08-15 ENCOUNTER — Ambulatory Visit
Admission: RE | Admit: 2015-08-15 | Discharge: 2015-08-15 | Disposition: A | Discharge status: Home / Self Care | Payer: Medicare Other | Source: Ambulatory Visit | Attending: Family Medicine | Admitting: Family Medicine

## 2015-08-15 ENCOUNTER — Other Ambulatory Visit: Payer: Self-pay | Admitting: Family Medicine

## 2015-08-15 DIAGNOSIS — R51 Headache: Principal | ICD-10-CM

## 2015-08-15 DIAGNOSIS — M542 Cervicalgia: Secondary | ICD-10-CM | POA: Diagnosis not present

## 2015-08-15 DIAGNOSIS — I4891 Unspecified atrial fibrillation: Secondary | ICD-10-CM | POA: Diagnosis not present

## 2015-08-15 DIAGNOSIS — R519 Headache, unspecified: Secondary | ICD-10-CM

## 2015-08-15 DIAGNOSIS — I129 Hypertensive chronic kidney disease with stage 1 through stage 4 chronic kidney disease, or unspecified chronic kidney disease: Secondary | ICD-10-CM | POA: Diagnosis not present

## 2015-08-21 ENCOUNTER — Ambulatory Visit (INDEPENDENT_AMBULATORY_CARE_PROVIDER_SITE_OTHER): Payer: Medicare Other | Admitting: *Deleted

## 2015-08-21 DIAGNOSIS — I4891 Unspecified atrial fibrillation: Secondary | ICD-10-CM

## 2015-08-21 DIAGNOSIS — Z5181 Encounter for therapeutic drug level monitoring: Secondary | ICD-10-CM

## 2015-08-21 DIAGNOSIS — I480 Paroxysmal atrial fibrillation: Secondary | ICD-10-CM | POA: Diagnosis not present

## 2015-08-21 LAB — POCT INR: INR: 2.6

## 2015-09-05 ENCOUNTER — Other Ambulatory Visit: Payer: Self-pay | Admitting: Pharmacist

## 2015-09-05 MED ORDER — WARFARIN SODIUM 5 MG PO TABS: ORAL_TABLET | ORAL | Status: DC

## 2015-09-10 ENCOUNTER — Ambulatory Visit (INDEPENDENT_AMBULATORY_CARE_PROVIDER_SITE_OTHER): Payer: Medicare Other | Admitting: *Deleted

## 2015-09-10 DIAGNOSIS — I495 Sick sinus syndrome: Secondary | ICD-10-CM | POA: Diagnosis not present

## 2015-09-10 NOTE — Progress Notes (Signed)
Remote pacemaker transmission.   

## 2015-09-18 LAB — CUP PACEART REMOTE DEVICE CHECK
Brady Statistic RA Percent Paced: 90 %
Date Time Interrogation Session: 20161115074100
Implantable Lead Location: 753859
Implantable Lead Model: 4135
Lead Channel Impedance Value: 643 Ohm
Lead Channel Setting Pacing Amplitude: 2 V
Lead Channel Setting Pacing Amplitude: 2.4 V
MDC IDC LEAD IMPLANT DT: 20140508
MDC IDC LEAD IMPLANT DT: 20140508
MDC IDC LEAD LOCATION: 753860
MDC IDC LEAD MODEL: 4136
MDC IDC LEAD SERIAL: 29333020
MDC IDC LEAD SERIAL: 29379474
MDC IDC MSMT BATTERY REMAINING LONGEVITY: 132 mo
MDC IDC MSMT BATTERY REMAINING PERCENTAGE: 100 %
MDC IDC MSMT LEADCHNL RA PACING THRESHOLD AMPLITUDE: 1 V
MDC IDC MSMT LEADCHNL RA PACING THRESHOLD PULSEWIDTH: 0.4 ms
MDC IDC MSMT LEADCHNL RV IMPEDANCE VALUE: 680 Ohm
MDC IDC SET LEADCHNL RV PACING PULSEWIDTH: 0.4 ms
MDC IDC SET LEADCHNL RV SENSING SENSITIVITY: 2.5 mV
MDC IDC STAT BRADY RV PERCENT PACED: 14 %
Pulse Gen Serial Number: 112392

## 2015-09-25 ENCOUNTER — Encounter: Payer: Self-pay | Admitting: Cardiology

## 2015-09-25 ENCOUNTER — Other Ambulatory Visit: Payer: Self-pay | Admitting: Cardiology

## 2015-10-07 ENCOUNTER — Ambulatory Visit (INDEPENDENT_AMBULATORY_CARE_PROVIDER_SITE_OTHER): Payer: Medicare Other | Admitting: *Deleted

## 2015-10-07 DIAGNOSIS — Z5181 Encounter for therapeutic drug level monitoring: Secondary | ICD-10-CM | POA: Diagnosis not present

## 2015-10-07 DIAGNOSIS — I4891 Unspecified atrial fibrillation: Secondary | ICD-10-CM | POA: Diagnosis not present

## 2015-10-07 DIAGNOSIS — I480 Paroxysmal atrial fibrillation: Secondary | ICD-10-CM

## 2015-10-07 LAB — POCT INR: INR: 2.9

## 2015-11-06 ENCOUNTER — Encounter: Payer: Self-pay | Admitting: Cardiology

## 2015-11-06 ENCOUNTER — Ambulatory Visit (INDEPENDENT_AMBULATORY_CARE_PROVIDER_SITE_OTHER): Payer: Medicare Other | Admitting: Pharmacist

## 2015-11-06 ENCOUNTER — Ambulatory Visit (INDEPENDENT_AMBULATORY_CARE_PROVIDER_SITE_OTHER): Payer: Medicare Other | Admitting: Cardiology

## 2015-11-06 VITALS — BP 120/82 | HR 58 | Ht 64.0 in | Wt 181.8 lb

## 2015-11-06 DIAGNOSIS — I495 Sick sinus syndrome: Secondary | ICD-10-CM

## 2015-11-06 DIAGNOSIS — I4891 Unspecified atrial fibrillation: Secondary | ICD-10-CM

## 2015-11-06 DIAGNOSIS — I480 Paroxysmal atrial fibrillation: Secondary | ICD-10-CM | POA: Diagnosis not present

## 2015-11-06 DIAGNOSIS — I493 Ventricular premature depolarization: Secondary | ICD-10-CM

## 2015-11-06 DIAGNOSIS — Z5181 Encounter for therapeutic drug level monitoring: Secondary | ICD-10-CM | POA: Diagnosis not present

## 2015-11-06 DIAGNOSIS — I340 Nonrheumatic mitral (valve) insufficiency: Secondary | ICD-10-CM

## 2015-11-06 DIAGNOSIS — I10 Essential (primary) hypertension: Secondary | ICD-10-CM

## 2015-11-06 DIAGNOSIS — Z950 Presence of cardiac pacemaker: Secondary | ICD-10-CM

## 2015-11-06 HISTORY — DX: Nonrheumatic mitral (valve) insufficiency: I34.0

## 2015-11-06 LAB — POCT INR: INR: 2.2

## 2015-11-06 NOTE — Patient Instructions (Signed)
Medication Instructions:  Your physician recommends that you continue on your current medications as directed. Please refer to the Current Medication list given to you today.   Labwork: None  Testing/Procedures: Your physician has requested that you have an echocardiogram. Echocardiography is a painless test that uses sound waves to create images of your heart. It provides your doctor with information about the size and shape of your heart and how well your heart's chambers and valves are working. This procedure takes approximately one hour. There are no restrictions for this procedure.  Follow-Up: Your physician wants you to follow-up in: 6 months with Dr. Turner. You will receive a reminder letter in the mail two months in advance. If you don't receive a letter, please call our office to schedule the follow-up appointment.   Any Other Special Instructions Will Be Listed Below (If Applicable).     If you need a refill on your cardiac medications before your next appointment, please call your pharmacy.   

## 2015-11-06 NOTE — Progress Notes (Signed)
Cardiology Office Note   Date:  11/06/2015   ID:  Molly [name], DOB 22-Sep-1933, MRN [id number]  PCP:  Gara Kroner, MD    Chief Complaint  Patient presents with  . Atrial Fibrillation  . Hypertension      History of Present Illness: Molly [name] is a 82 y.o. female with a history of paroxysmal atrial fibrillation on chronic systemic anticoagulation, HTN, symptomatic PVC's, low normal LVF EF 50-55%, tachybrady syndrome s/p PPM who presents today for followup. She is doing well from a cardiac standpoint.  She has some problems with fatigue. And back pain.  She denies any chest pain, dizziness or syncope. She occasionally has some LE edema which is fairly controlled on Lasix. She occasionally has some skipped heart beats and still feels as though she has some breakthrough of afib but variable in duration.  She had some yesterday that lasted a few hours and then resolved.   She complains of occasional DOE with extreme exertion which is stable.     Past Medical History  Diagnosis Date  . Hypertension   . Atrial fibrillation (HCC)     paroxysmal  . High cholesterol   . Hyperparathyroidism ([place])   . GERD (gastroesophageal reflux disease)   . PAF (paroxysmal atrial fibrillation) ([place])   . H/O hyperkalemia     Resolved off ACE inhibitors  . Renal insufficiency     MILD  . LV dysfunction     MILD-EF40-45% 2003 now EF 50-55%  . H/O benign essential tremor     on amiodarone resolved on lower dose of amio  . PVC's (premature ventricular contractions)   . Tachycardia-bradycardia syndrome ([place])     s/p PPM 02/2013  . Dysrhythmia     PAF  . Complication of anesthesia   . PONV (postoperative nausea and vomiting)   . Shortness of breath     with exertion  . History of blood transfusion   . Cancer ([place])     Skin cancer- basal.  1 mylenoma  . Mitral regurgitation 11/06/2015    Past Surgical History  Procedure Laterality Date  . Shoulder surgery Right       roto cuff  . Cardiac catheterization  09/25/2000    EF of 50% -- with normal left ventricular size and function  . Cesarean section    . Cesarean section    . Tonsillectomy    . Shoulder arthroscopy w/ rotator cuff repair    . Bunionectomy      Right  . Varicose vein surgery    . Foot surgery    . Insert / replace / remove pacemaker  02/2013  . Pacemaker insertion  02/2013  . Permanent pacemaker insertion N/A 03/02/2013    Procedure: PERMANENT PACEMAKER INSERTION;  Surgeon: Evans Lance, MD;  [facility]: [facility] C[facility] LAB;  Service: Cardiovascular;  Laterality: N/A;  . Eye surgery Bilateral     Cataract  . Toe amputation Left     2nd and 3rd, bunion under toes.  . Amputation Right 11/27/2014    Procedure: RIGHT 2ND AND 3RD TOE AMPUTATION;  Surgeon: Wylene Simmer, MD;  Location: [facility];  Service: Orthopedics;  Laterality: Right;     Current Outpatient Prescriptions  Medication Sig Dispense Refill  . acetaminophen (TYLENOL) 500 MG tablet Take 1,000 mg by mouth every 6 (six) hours as needed. For pain    . atorvastatin (LIPITOR)  10 MG tablet Take 10 mg by mouth every evening.    . dorzolamide (TRUSOPT) 2 % ophthalmic solution Place 1 drop into the right eye 2 (two) times daily.    . furosemide (LASIX) 20 MG tablet Take 20 mg by mouth daily.    Marland Kitchen latanoprost (XALATAN) 0.005 % ophthalmic solution Place 1 drop into the right eye at bedtime.    Marland Kitchen LORazepam (ATIVAN) 0.5 MG tablet TAKE 1/2 TO  TABLET 0.5 MG BY MOUTH  AT BEDTIME  0  . Multiple Vitamin (MULTIVITAMIN WITH MINERALS) TABS Take 1 tablet by mouth daily.    Marland Kitchen omeprazole (PRILOSEC) 20 MG capsule Take 20 mg by mouth daily.    . sotalol (BETAPACE) 80 MG tablet Take 80 mg by mouth 2 (two) times daily.    Marland Kitchen warfarin (COUMADIN) 5 MG tablet TAKE 1/2-1 tablet daily AS DIRECTED BY ANTICOAGULATION CLINIC 30 tablet 3   No current facility-administered medications for this visit.    Allergies:   Tramadol    Social History:  The patient  reports  that she has never smoked. She has never used smokeless tobacco. She reports that she does not drink alcohol or use illicit drugs.   Family History:  The patient's family history includes Hypertension in her father and mother.    ROS:  Please see the history of present illness.   Otherwise, review of systems are positive for none.   All other systems are reviewed and negative.    PHYSICAL EXAM: VS:  BP 120/82 mmHg  Pulse 58  Ht 5\' 4"  (1.626 m)  Wt 181 lb 12.8 oz (82.464 kg)  BMI 31.19 kg/m2 , BMI Body mass index is 31.19 kg/(m^2). GEN: Well nourished, well developed, in no acute distress HEENT: normal Neck: no JVD, carotid bruits, or masses Cardiac: RRR; no murmurs, rubs, or gallops,no edema  Respiratory:  clear to auscultation bilaterally, normal work of breathing GI: soft, nontender, nondistended, + BS MS: no deformity or atrophy Skin: warm and dry, no rash Neuro:  Strength and sensation are intact Psych: euthymic mood, full affect   EKG:  EKG was ordered today and showed atrial paced rhythm with occasional PVC and nonspecific IVCD    Recent Labs: 11/26/2014: BUN 23; Creatinine, Ser 1.09; Hemoglobin 13.1; Platelets 191; Potassium 4.8; Sodium 139    Lipid Panel    Component Value Date/Time   CHOL 156 05/23/2012 0514   TRIG 86 05/23/2012 0514   HDL 80 05/23/2012 0514   CHOLHDL 2.0 05/23/2012 0514   VLDL 17 05/23/2012 0514   LDLCALC 59 05/23/2012 0514      Wt Readings from Last 3 Encounters:  11/06/15 181 lb 12.8 oz (82.464 kg)  03/26/15 179 lb (81.194 kg)  03/12/15 178 lb 12.8 oz (81.103 kg)    ASSESSMENT AND PLAN:  1. Paroxysmal atrial fibrillation  - continue Warfarin/sotolol  2. Chronic systemic anticoagulation 3.   HTN - controlled on current medical regimen.  No changes.   4. Symptomatic PVC's - resolved 5. Tachybrady syndrome s/p PPM 6. Mild to moderate MR on echo 2008 - I will recheck to assess for progession    Current medicines are reviewed at  length with the patient today.  The patient does not have concerns regarding medicines.  The following changes have been made:  no change  Labs/ tests ordered today: See above Assessment and Plan No orders of the defined types were placed in this encounter.     Disposition:   FU with  me in 6 months  Signed, Sueanne Margarita, MD  11/06/2015 2:46 PM    [facility] [address], [place], [place]  [address] Phone: [phone]; Fax: [phone]

## 2015-11-18 ENCOUNTER — Other Ambulatory Visit (HOSPITAL_COMMUNITY): Payer: Medicare Other

## 2015-11-19 DIAGNOSIS — Z23 Encounter for immunization: Secondary | ICD-10-CM | POA: Diagnosis not present

## 2015-11-19 DIAGNOSIS — F419 Anxiety disorder, unspecified: Secondary | ICD-10-CM | POA: Diagnosis not present

## 2015-11-19 DIAGNOSIS — N183 Chronic kidney disease, stage 3 (moderate): Secondary | ICD-10-CM | POA: Diagnosis not present

## 2015-11-19 DIAGNOSIS — I129 Hypertensive chronic kidney disease with stage 1 through stage 4 chronic kidney disease, or unspecified chronic kidney disease: Secondary | ICD-10-CM | POA: Diagnosis not present

## 2015-11-19 DIAGNOSIS — E782 Mixed hyperlipidemia: Secondary | ICD-10-CM | POA: Diagnosis not present

## 2015-11-19 DIAGNOSIS — F3340 Major depressive disorder, recurrent, in remission, unspecified: Secondary | ICD-10-CM | POA: Diagnosis not present

## 2015-11-19 DIAGNOSIS — I4891 Unspecified atrial fibrillation: Secondary | ICD-10-CM | POA: Diagnosis not present

## 2015-11-19 DIAGNOSIS — R609 Edema, unspecified: Secondary | ICD-10-CM | POA: Diagnosis not present

## 2015-11-19 DIAGNOSIS — Z950 Presence of cardiac pacemaker: Secondary | ICD-10-CM | POA: Diagnosis not present

## 2015-11-19 DIAGNOSIS — K219 Gastro-esophageal reflux disease without esophagitis: Secondary | ICD-10-CM | POA: Diagnosis not present

## 2015-11-19 DIAGNOSIS — Z1389 Encounter for screening for other disorder: Secondary | ICD-10-CM | POA: Diagnosis not present

## 2015-12-10 ENCOUNTER — Ambulatory Visit (INDEPENDENT_AMBULATORY_CARE_PROVIDER_SITE_OTHER): Payer: Medicare Other | Admitting: *Deleted

## 2015-12-10 DIAGNOSIS — I495 Sick sinus syndrome: Secondary | ICD-10-CM | POA: Diagnosis not present

## 2015-12-10 NOTE — Progress Notes (Signed)
Remote pacemaker transmission.   

## 2015-12-11 ENCOUNTER — Other Ambulatory Visit: Payer: Self-pay

## 2015-12-11 ENCOUNTER — Ambulatory Visit (HOSPITAL_COMMUNITY): Payer: Medicare Other | Attending: Cardiology

## 2015-12-11 ENCOUNTER — Ambulatory Visit (INDEPENDENT_AMBULATORY_CARE_PROVIDER_SITE_OTHER): Payer: Medicare Other | Admitting: *Deleted

## 2015-12-11 ENCOUNTER — Encounter: Payer: Self-pay | Admitting: Cardiology

## 2015-12-11 DIAGNOSIS — E785 Hyperlipidemia, unspecified: Secondary | ICD-10-CM | POA: Diagnosis not present

## 2015-12-11 DIAGNOSIS — I480 Paroxysmal atrial fibrillation: Secondary | ICD-10-CM | POA: Diagnosis not present

## 2015-12-11 DIAGNOSIS — I119 Hypertensive heart disease without heart failure: Secondary | ICD-10-CM | POA: Insufficient documentation

## 2015-12-11 DIAGNOSIS — I340 Nonrheumatic mitral (valve) insufficiency: Secondary | ICD-10-CM | POA: Insufficient documentation

## 2015-12-11 DIAGNOSIS — I4891 Unspecified atrial fibrillation: Secondary | ICD-10-CM | POA: Insufficient documentation

## 2015-12-11 DIAGNOSIS — Z5181 Encounter for therapeutic drug level monitoring: Secondary | ICD-10-CM

## 2015-12-11 DIAGNOSIS — I059 Rheumatic mitral valve disease, unspecified: Secondary | ICD-10-CM | POA: Diagnosis present

## 2015-12-11 DIAGNOSIS — I351 Nonrheumatic aortic (valve) insufficiency: Secondary | ICD-10-CM | POA: Insufficient documentation

## 2015-12-11 LAB — CUP PACEART REMOTE DEVICE CHECK
Brady Statistic RA Percent Paced: 90 %
Implantable Lead Implant Date: 20140508
Implantable Lead Implant Date: 20140508
Implantable Lead Location: 753859
Implantable Lead Model: 4135
Implantable Lead Model: 4136
Implantable Lead Serial Number: 29379474
Lead Channel Impedance Value: 612 Ohm
Lead Channel Impedance Value: 656 Ohm
Lead Channel Sensing Intrinsic Amplitude: 11.7 mV
Lead Channel Setting Pacing Amplitude: 2.4 V
Lead Channel Setting Pacing Pulse Width: 0.4 ms
MDC IDC LEAD LOCATION: 753860
MDC IDC LEAD SERIAL: 29333020
MDC IDC MSMT BATTERY REMAINING LONGEVITY: 126 mo
MDC IDC MSMT BATTERY REMAINING PERCENTAGE: 100 %
MDC IDC SESS DTM: 20170214130400
MDC IDC SET LEADCHNL RA PACING AMPLITUDE: 2 V
MDC IDC SET LEADCHNL RV SENSING SENSITIVITY: 2.5 mV
MDC IDC STAT BRADY RV PERCENT PACED: 14 %
Pulse Gen Serial Number: 112392

## 2015-12-11 LAB — POCT INR: INR: 2.3

## 2015-12-13 ENCOUNTER — Telehealth: Payer: Self-pay

## 2015-12-13 DIAGNOSIS — R931 Abnormal findings on diagnostic imaging of heart and coronary circulation: Secondary | ICD-10-CM

## 2015-12-13 DIAGNOSIS — I340 Nonrheumatic mitral (valve) insufficiency: Secondary | ICD-10-CM

## 2015-12-13 DIAGNOSIS — I480 Paroxysmal atrial fibrillation: Secondary | ICD-10-CM

## 2015-12-13 NOTE — Telephone Encounter (Signed)
Informed patient of results and verbal understanding expressed.  Repeat echo ordered to be scheduled in 1 year. Nuclear stress test ordered to be scheduled. Patient agrees with treatment plan.

## 2015-12-13 NOTE — Telephone Encounter (Signed)
-----   Message from Sueanne Margarita, MD sent at 12/11/2015  6:43 PM EST ----- Echo showed mildly reduced LVF with EF 40-45% with mild AR and mild to moderate MR - repeat echo in 1 year to followup on MR.  EF has declined from prior - please get nuclear stress test to rule out ischeami

## 2015-12-17 DIAGNOSIS — L820 Inflamed seborrheic keratosis: Secondary | ICD-10-CM | POA: Diagnosis not present

## 2015-12-17 DIAGNOSIS — L570 Actinic keratosis: Secondary | ICD-10-CM | POA: Diagnosis not present

## 2015-12-17 DIAGNOSIS — D225 Melanocytic nevi of trunk: Secondary | ICD-10-CM | POA: Diagnosis not present

## 2015-12-17 DIAGNOSIS — Z85828 Personal history of other malignant neoplasm of skin: Secondary | ICD-10-CM | POA: Diagnosis not present

## 2015-12-18 ENCOUNTER — Other Ambulatory Visit: Payer: Self-pay | Admitting: Cardiology

## 2015-12-18 NOTE — Telephone Encounter (Signed)
REFILL 

## 2015-12-26 ENCOUNTER — Telehealth (HOSPITAL_COMMUNITY): Payer: Self-pay | Admitting: *Deleted

## 2015-12-26 NOTE — Telephone Encounter (Signed)
Patient given detailed instructions per Myocardial Perfusion Study Information Sheet for the test on 12/31/15. Patient notified to arrive 15 minutes early and that it is imperative to arrive on time for appointment to keep from having the test rescheduled.  If you need to cancel or reschedule your appointment, please call the office within 24 hours of your appointment. Failure to do so may result in a cancellation of your appointment, and a $50 no show fee. Patient verbalized understanding.Hanae Waiters J Mounir Skipper,RN  

## 2015-12-31 ENCOUNTER — Ambulatory Visit (HOSPITAL_COMMUNITY): Payer: Medicare Other | Attending: Cardiology

## 2015-12-31 ENCOUNTER — Telehealth: Payer: Self-pay | Admitting: Cardiology

## 2015-12-31 DIAGNOSIS — I480 Paroxysmal atrial fibrillation: Secondary | ICD-10-CM | POA: Diagnosis not present

## 2015-12-31 DIAGNOSIS — I10 Essential (primary) hypertension: Secondary | ICD-10-CM | POA: Diagnosis not present

## 2015-12-31 DIAGNOSIS — R0609 Other forms of dyspnea: Secondary | ICD-10-CM | POA: Insufficient documentation

## 2015-12-31 DIAGNOSIS — R931 Abnormal findings on diagnostic imaging of heart and coronary circulation: Secondary | ICD-10-CM | POA: Diagnosis not present

## 2015-12-31 LAB — MYOCARDIAL PERFUSION IMAGING
CHL CUP NUCLEAR SRS: 4
CHL CUP RESTING HR STRESS: 70 {beats}/min
NUC STRESS TID: 0.97
Peak HR: 82 {beats}/min
RATE: 0.31
SDS: 1
SSS: 5

## 2015-12-31 MED ORDER — TECHNETIUM TC 99M SESTAMIBI GENERIC - CARDIOLITE
10.8000 | Freq: Once | INTRAVENOUS | Status: AC | PRN
  Administered 2015-12-31: 11 via INTRAVENOUS

## 2015-12-31 MED ORDER — TECHNETIUM TC 99M SESTAMIBI GENERIC - CARDIOLITE
32.5000 | Freq: Once | INTRAVENOUS | Status: AC | PRN
  Administered 2015-12-31: 33 via INTRAVENOUS

## 2015-12-31 MED ORDER — REGADENOSON 0.4 MG/5ML IV SOLN
0.4000 mg | Freq: Once | INTRAVENOUS | Status: AC
  Administered 2015-12-31: 0.4 mg via INTRAVENOUS

## 2015-12-31 NOTE — Telephone Encounter (Signed)
Please have patient come in to get pacer interrogated to see how much ventricular ectopy she is having and how much she is V paced - trying to determine why LVF has declined

## 2015-12-31 NOTE — Telephone Encounter (Signed)
Please call patient and ask to send a remote transmission tonight. We will review tomorrow am and send results to Dr Radford Pax.  Chanetta Marshall, NP 12/31/2015 3:54 PM

## 2015-12-31 NOTE — Telephone Encounter (Signed)
Instructed patient to send remote transmission tonight for review. Patient agrees with treatment plan.

## 2016-01-01 NOTE — Telephone Encounter (Signed)
I need to know her PVC load and what percentage she is RV paced

## 2016-01-01 NOTE — Telephone Encounter (Signed)
Remote reviewed Battery - 10.5 years to ERI Lead measurements stable AP 91%, VP 14% 8 days of AF (3% burden), V rates relatively well controlled in AF 14,498 PVC's in last 294 days Histograms appropriate NSVT episodes are AF with RVR  Chanetta Marshall, NP 01/01/2016 7:24 AM

## 2016-01-01 NOTE — Telephone Encounter (Signed)
14% RV pacing  No PVC % on this device

## 2016-01-01 NOTE — Telephone Encounter (Signed)
No new recommendations

## 2016-01-03 NOTE — Telephone Encounter (Signed)
Informed patient Dr. Radford Pax has no new recommendations. Patient was grateful for call.

## 2016-01-03 NOTE — Telephone Encounter (Signed)
Left message to call back  

## 2016-01-27 ENCOUNTER — Ambulatory Visit (INDEPENDENT_AMBULATORY_CARE_PROVIDER_SITE_OTHER): Payer: Medicare Other | Admitting: *Deleted

## 2016-01-27 DIAGNOSIS — Z5181 Encounter for therapeutic drug level monitoring: Secondary | ICD-10-CM

## 2016-01-27 DIAGNOSIS — I480 Paroxysmal atrial fibrillation: Secondary | ICD-10-CM | POA: Diagnosis not present

## 2016-01-27 DIAGNOSIS — I4891 Unspecified atrial fibrillation: Secondary | ICD-10-CM

## 2016-01-27 LAB — POCT INR: INR: 2.3

## 2016-01-28 ENCOUNTER — Telehealth: Payer: Self-pay | Admitting: Cardiology

## 2016-01-28 NOTE — Telephone Encounter (Signed)
Follow up   Per pt "she is calling back because she has not heard anything"  Please call pt

## 2016-01-28 NOTE — Telephone Encounter (Signed)
New Message:  Pt called in stating that she has been having some problems with Fatigue and she would like to speak with a nurse. Please f/u with her

## 2016-01-28 NOTE — Telephone Encounter (Signed)
Patient called to report extreme fatigue for about 3 weeks.  Her energy "is just gone" and she is "so tired all the time."  She went to the beach last week and couldn't do anything 2 days she was there she was so exhausted. She has no c/o CP. She reports very mild SOB on exertion. She reports no more swelling than usual (her ankles are usually swollen).  BP today was 124/88, HR in the 80s. She has been taking her medications as directed.  Patient is to call PCP and review symptoms. Patient understands she will be called tomorrow with Dr. Theodosia Blender recommendations.

## 2016-01-29 NOTE — Telephone Encounter (Signed)
Needs to see PCP

## 2016-01-29 NOTE — Telephone Encounter (Signed)
Called both home and cell. Mailbox full on cell- unable to leave message.  Left message on home machine that per Dr. Radford Pax, patient should see PCP for evaluation of symptoms. Instructed patient to call with further questions or concerns.

## 2016-02-05 ENCOUNTER — Telehealth: Payer: Self-pay | Admitting: Internal Medicine

## 2016-02-05 NOTE — Telephone Encounter (Signed)
Tried to call patient and her mailbox is full.  Will try on her home phone.  Left patient a message on her home phone to call me back

## 2016-02-05 NOTE — Telephone Encounter (Signed)
New Message  Patient c/o Palpitations:  High priority if patient c/o lightheadedness and shortness of breath.  1. How long have you been having Afib? Real bad within the last 24 hours with Fatique.   2. Are you currently experiencing lightheadedness and shortness of breath? No.   3. Have you checked your BP and heart rate? (document readings)  Bp at times 130/80 Something. ( no documented readings)    4. Are you experiencing any other symptoms? No, Nothing other than feeling really really tired.

## 2016-02-07 NOTE — Telephone Encounter (Addendum)
Called the patient again.  She has been having problems with her afib and really was just trying to move her appointment up with Dr [name] to earlier in May.  I have offered 3 different times but none of these were good for her as her husband still works and brings her.  She wants to leave the appointment where it is and will call back if she feels she needs to.  She appreciated my calling back again.

## 2016-02-08 ENCOUNTER — Other Ambulatory Visit: Payer: Self-pay | Admitting: Cardiology

## 2016-03-03 DIAGNOSIS — R5383 Other fatigue: Secondary | ICD-10-CM | POA: Diagnosis not present

## 2016-03-03 DIAGNOSIS — H8110 Benign paroxysmal vertigo, unspecified ear: Secondary | ICD-10-CM | POA: Diagnosis not present

## 2016-03-03 DIAGNOSIS — R0982 Postnasal drip: Secondary | ICD-10-CM | POA: Diagnosis not present

## 2016-03-03 DIAGNOSIS — E782 Mixed hyperlipidemia: Secondary | ICD-10-CM | POA: Diagnosis not present

## 2016-03-17 ENCOUNTER — Encounter: Payer: Self-pay | Admitting: Internal Medicine

## 2016-03-17 ENCOUNTER — Ambulatory Visit (INDEPENDENT_AMBULATORY_CARE_PROVIDER_SITE_OTHER): Payer: Medicare Other | Admitting: *Deleted

## 2016-03-17 ENCOUNTER — Ambulatory Visit (INDEPENDENT_AMBULATORY_CARE_PROVIDER_SITE_OTHER): Payer: Medicare Other | Admitting: Internal Medicine

## 2016-03-17 VITALS — BP 148/94 | HR 77 | Ht 65.0 in | Wt 185.1 lb

## 2016-03-17 DIAGNOSIS — I480 Paroxysmal atrial fibrillation: Secondary | ICD-10-CM | POA: Diagnosis not present

## 2016-03-17 DIAGNOSIS — Z5181 Encounter for therapeutic drug level monitoring: Secondary | ICD-10-CM

## 2016-03-17 DIAGNOSIS — I4891 Unspecified atrial fibrillation: Secondary | ICD-10-CM

## 2016-03-17 LAB — POCT INR: INR: 2.7

## 2016-03-17 MED ORDER — SOTALOL HCL 80 MG PO TABS
120.0000 mg | ORAL_TABLET | Freq: Every day | ORAL | Status: DC
Start: 2016-03-17 — End: 2016-12-05

## 2016-03-17 NOTE — Patient Instructions (Addendum)
Medication Instructions:  Your physician has recommended you make the following change in your medication:  1) Decrease Sotalol to 120 mg daily    Labwork: None ordered   Testing/Procedures: None ordered   Follow-Up: Your physician wants you to follow-up in: 12 months with Dr Knox Saliva will receive a reminder letter in the mail two months in advance. If you don't receive a letter, please call our office to schedule the follow-up appointment.  Remote monitoring is used to monitor your Pacemaker  from home. This monitoring reduces the number of office visits required to check your device to one time per year. It allows [place] to keep an eye on the functioning of your device to ensure it is working properly. You are scheduled for a device check from home on 06/16/16. You may send your transmission at any time that day. If you have a wireless device, the transmission will be sent automatically. After your physician reviews your transmission, you will receive a postcard with your next transmission date.     Any Other Special Instructions Will Be Listed Below (If Applicable).     If you need a refill on your cardiac medications before your next appointment, please call your pharmacy.

## 2016-03-17 NOTE — Progress Notes (Signed)
HPI Molly [name] returns today for followup. She is a very pleasant 82 year old woman with a history of frequent PVCs, and atrial fibrillation. The patient has been on multiple medications in attempt to control her arrhythmias.  She has been placed on sotalol.  She had fatigue with calcium channel blockers. In the interim she notes fatigue and weakness have worsened. No syncope, and no chest pain. She has minimal dyspnea with exertion and peripheral edema. She admits to being sedentary. She does not appear to appreciate when her heart races.  Allergies  Allergen Reactions  . Tramadol Nausea Only     Current Outpatient Prescriptions  Medication Sig Dispense Refill  . acetaminophen (TYLENOL) 500 MG tablet Take 1,000 mg by mouth every 6 (six) hours as needed. For pain    . atorvastatin (LIPITOR) 10 MG tablet Take 10 mg by mouth every evening.    . dorzolamide (TRUSOPT) 2 % ophthalmic solution Place 1 drop into the right eye 2 (two) times daily.    . furosemide (LASIX) 20 MG tablet Take 20 mg by mouth daily.    Marland Kitchen latanoprost (XALATAN) 0.005 % ophthalmic solution Place 1 drop into the right eye at bedtime.    . meclizine (ANTIVERT) 25 MG tablet Take 25 mg by mouth 3 (three) times daily as needed for dizziness.    . Multiple Vitamin (MULTIVITAMIN WITH MINERALS) TABS Take 1 tablet by mouth daily.    Marland Kitchen omeprazole (PRILOSEC) 20 MG capsule Take 20 mg by mouth daily.    . sotalol (BETAPACE) 80 MG tablet Take 1.5 tablets (120 mg total) by mouth at bedtime. 60 tablet 5  . warfarin (COUMADIN) 5 MG tablet TAKE 1/2-1 TABLET DAILY AS DIRECTED BY ANTICOAGULATION CLINIC 30 tablet 3   No current facility-administered medications for this visit.     Past Medical History  Diagnosis Date  . Hypertension   . Atrial fibrillation (HCC)     paroxysmal  . High cholesterol   . Hyperparathyroidism ([place])   . GERD (gastroesophageal reflux disease)   . PAF (paroxysmal atrial fibrillation) ([place])   . H/O hyperkalemia      Resolved off ACE inhibitors  . Renal insufficiency     MILD  . LV dysfunction     MILD-EF40-45% 2003 now EF 50-55%  . H/O benign essential tremor     on amiodarone resolved on lower dose of amio  . PVC's (premature ventricular contractions)   . Tachycardia-bradycardia syndrome ([place])     s/p PPM 02/2013  . Dysrhythmia     PAF  . Complication of anesthesia   . PONV (postoperative nausea and vomiting)   . Shortness of breath     with exertion  . History of blood transfusion   . Cancer ([place])     Skin cancer- basal.  1 mylenoma  . Mitral regurgitation 11/06/2015    ROS:   All systems reviewed and negative except as noted in the HPI.   Past Surgical History  Procedure Laterality Date  . Shoulder surgery Right     roto cuff  . Cardiac catheterization  09/25/2000    EF of 50% -- with normal left ventricular size and function  . Cesarean section    . Cesarean section    . Tonsillectomy    . Shoulder arthroscopy w/ rotator cuff repair    . Bunionectomy      Right  . Varicose vein surgery    . Foot surgery    . Insert / replace / remove  pacemaker  02/2013  . Pacemaker insertion  02/2013  . Permanent pacemaker insertion N/A 03/02/2013    Procedure: PERMANENT PACEMAKER INSERTION;  Surgeon: Evans Lance, MD;  Location: [facility] CATH LAB;  Service: Cardiovascular;  Laterality: N/A;  . Eye surgery Bilateral     Cataract  . Toe amputation Left     2nd and 3rd, bunion under toes.  . Amputation Right 11/27/2014    Procedure: RIGHT 2ND AND 3RD TOE AMPUTATION;  Surgeon: Wylene Simmer, MD;  Location: [facility];  Service: Orthopedics;  Laterality: Right;     Family History  Problem Relation Age of Onset  . Hypertension Mother   . Hypertension Father      Social History   Social History  . Marital Status: Married    Spouse Name: N/A  . Number of Children: N/A  . Years of Education: N/A   Occupational History  . Not on file.   Social History Main Topics  . Smoking status: Never  Smoker   . Smokeless tobacco: Never Used  . Alcohol Use: No  . Drug Use: No  . Sexual Activity: Not on file   Other Topics Concern  . Not on file   Social History Narrative     BP 148/94 mmHg  Pulse 77  Ht 5\' 5"  (1.651 m)  Wt 185 lb 1.9 oz (83.97 kg)  BMI 30.81 kg/m2  Physical Exam:  Well appearing 82 year old woman,NAD HEENT: Unremarkable Neck:  6 cm JVD, no thyromegally Back:  No CVA tenderness Lungs:  Clear with no wheezes, rales or rhonchi HEART:  Regular rate rhythm, no murmurs, no rubs, no clicks Abd:  soft, positive bowel sounds, no organomegally, no rebound, no guarding Ext:  2 plus pulses, no edema, no cyanosis, no clubbing Skin:  No rashes no nodules Neuro:  CN II through XII intact, motor grossly intact   DEVICE  Normal device function.  See PaceArt for details.   Assess/Plan: 1. PAF/Flutter - she is mostly in NSR. She will continue her current meds but I have asked her to reduce her dose of sotalol down to 120 mg daily. 2. PM - her [place] DDD PM is working normally.  3. HTN - her blood pressure is elevated. I will defer additional treatment to Dr. Real Cons.D.

## 2016-03-18 LAB — CUP PACEART INCLINIC DEVICE CHECK
Date Time Interrogation Session: 20170523040000
Implantable Lead Implant Date: 20140508
Implantable Lead Implant Date: 20140508
Implantable Lead Location: 753859
Implantable Lead Location: 753860
Implantable Lead Model: 4135
Implantable Lead Model: 4136
Implantable Lead Serial Number: 29333020
Implantable Lead Serial Number: 29379474
Lead Channel Impedance Value: 670 Ohm
Lead Channel Impedance Value: 689 Ohm
Lead Channel Pacing Threshold Amplitude: 0.7 V
Lead Channel Pacing Threshold Pulse Width: 0.4 ms
Lead Channel Pacing Threshold Pulse Width: 0.4 ms
Lead Channel Setting Pacing Amplitude: 2 V
Lead Channel Setting Pacing Amplitude: 2.4 V
MDC IDC MSMT LEADCHNL RA PACING THRESHOLD AMPLITUDE: 1 V
MDC IDC MSMT LEADCHNL RA SENSING INTR AMPL: 1.8 mV
MDC IDC MSMT LEADCHNL RV SENSING INTR AMPL: 17.9 mV
MDC IDC PG SERIAL: 112392
MDC IDC SET LEADCHNL RV PACING PULSEWIDTH: 0.4 ms
MDC IDC SET LEADCHNL RV SENSING SENSITIVITY: 2.5 mV
MDC IDC STAT BRADY RA PERCENT PACED: 88 %
MDC IDC STAT BRADY RV PERCENT PACED: 14 %

## 2016-03-30 DIAGNOSIS — H401412 Capsular glaucoma with pseudoexfoliation of lens, right eye, moderate stage: Secondary | ICD-10-CM | POA: Diagnosis not present

## 2016-04-27 ENCOUNTER — Ambulatory Visit (INDEPENDENT_AMBULATORY_CARE_PROVIDER_SITE_OTHER): Payer: Medicare Other | Admitting: *Deleted

## 2016-04-27 DIAGNOSIS — I4891 Unspecified atrial fibrillation: Secondary | ICD-10-CM | POA: Diagnosis not present

## 2016-04-27 DIAGNOSIS — Z5181 Encounter for therapeutic drug level monitoring: Secondary | ICD-10-CM | POA: Diagnosis not present

## 2016-04-27 DIAGNOSIS — I480 Paroxysmal atrial fibrillation: Secondary | ICD-10-CM | POA: Diagnosis not present

## 2016-04-27 LAB — POCT INR: INR: 2.3

## 2016-06-08 ENCOUNTER — Ambulatory Visit (INDEPENDENT_AMBULATORY_CARE_PROVIDER_SITE_OTHER): Payer: Medicare Other | Admitting: Pharmacist

## 2016-06-08 DIAGNOSIS — I4891 Unspecified atrial fibrillation: Secondary | ICD-10-CM

## 2016-06-08 DIAGNOSIS — Z5181 Encounter for therapeutic drug level monitoring: Secondary | ICD-10-CM

## 2016-06-08 LAB — POCT INR: INR: 2.6

## 2016-06-16 ENCOUNTER — Ambulatory Visit (INDEPENDENT_AMBULATORY_CARE_PROVIDER_SITE_OTHER): Payer: Medicare Other | Admitting: *Deleted

## 2016-06-16 DIAGNOSIS — I495 Sick sinus syndrome: Secondary | ICD-10-CM | POA: Diagnosis not present

## 2016-06-16 NOTE — Progress Notes (Signed)
Remote pacemaker transmission.   

## 2016-06-19 ENCOUNTER — Encounter: Payer: Self-pay | Admitting: Cardiology

## 2016-06-30 LAB — CUP PACEART REMOTE DEVICE CHECK
Battery Remaining Percentage: 100 %
Brady Statistic RA Percent Paced: 87 %
Date Time Interrogation Session: 20170822064200
Implantable Lead Implant Date: 20140508
Implantable Lead Model: 4135
Implantable Lead Model: 4136
Lead Channel Impedance Value: 634 Ohm
Lead Channel Impedance Value: 684 Ohm
Lead Channel Pacing Threshold Pulse Width: 0.4 ms
Lead Channel Setting Pacing Amplitude: 2.4 V
Lead Channel Setting Pacing Pulse Width: 0.4 ms
MDC IDC LEAD IMPLANT DT: 20140508
MDC IDC LEAD LOCATION: 753859
MDC IDC LEAD LOCATION: 753860
MDC IDC LEAD SERIAL: 29333020
MDC IDC LEAD SERIAL: 29379474
MDC IDC MSMT BATTERY REMAINING LONGEVITY: 126 mo
MDC IDC MSMT LEADCHNL RA PACING THRESHOLD AMPLITUDE: 1 V
MDC IDC PG SERIAL: 112392
MDC IDC SET LEADCHNL RA PACING AMPLITUDE: 2 V
MDC IDC SET LEADCHNL RV SENSING SENSITIVITY: 2.5 mV
MDC IDC STAT BRADY RV PERCENT PACED: 16 %

## 2016-07-04 ENCOUNTER — Other Ambulatory Visit: Payer: Self-pay | Admitting: Cardiology

## 2016-07-20 ENCOUNTER — Ambulatory Visit (INDEPENDENT_AMBULATORY_CARE_PROVIDER_SITE_OTHER): Payer: Medicare Other | Admitting: Pharmacist

## 2016-07-20 DIAGNOSIS — Z5181 Encounter for therapeutic drug level monitoring: Secondary | ICD-10-CM

## 2016-07-20 DIAGNOSIS — I4891 Unspecified atrial fibrillation: Secondary | ICD-10-CM | POA: Diagnosis not present

## 2016-07-20 LAB — POCT INR: INR: 2.7

## 2016-08-13 DIAGNOSIS — Z950 Presence of cardiac pacemaker: Secondary | ICD-10-CM | POA: Diagnosis not present

## 2016-08-13 DIAGNOSIS — E782 Mixed hyperlipidemia: Secondary | ICD-10-CM | POA: Diagnosis not present

## 2016-08-13 DIAGNOSIS — I4891 Unspecified atrial fibrillation: Secondary | ICD-10-CM | POA: Diagnosis not present

## 2016-08-13 DIAGNOSIS — Z23 Encounter for immunization: Secondary | ICD-10-CM | POA: Diagnosis not present

## 2016-08-13 DIAGNOSIS — N183 Chronic kidney disease, stage 3 (moderate): Secondary | ICD-10-CM | POA: Diagnosis not present

## 2016-08-13 DIAGNOSIS — I129 Hypertensive chronic kidney disease with stage 1 through stage 4 chronic kidney disease, or unspecified chronic kidney disease: Secondary | ICD-10-CM | POA: Diagnosis not present

## 2016-08-13 DIAGNOSIS — F3340 Major depressive disorder, recurrent, in remission, unspecified: Secondary | ICD-10-CM | POA: Diagnosis not present

## 2016-08-13 DIAGNOSIS — M545 Low back pain: Secondary | ICD-10-CM | POA: Diagnosis not present

## 2016-08-13 DIAGNOSIS — K219 Gastro-esophageal reflux disease without esophagitis: Secondary | ICD-10-CM | POA: Diagnosis not present

## 2016-08-13 DIAGNOSIS — F419 Anxiety disorder, unspecified: Secondary | ICD-10-CM | POA: Diagnosis not present

## 2016-08-13 DIAGNOSIS — R609 Edema, unspecified: Secondary | ICD-10-CM | POA: Diagnosis not present

## 2016-08-28 ENCOUNTER — Encounter: Payer: Self-pay | Admitting: *Deleted

## 2016-08-28 ENCOUNTER — Telehealth: Payer: Self-pay | Admitting: Internal Medicine

## 2016-08-28 NOTE — Telephone Encounter (Signed)
This encounter was created in error - please disregard.

## 2016-08-28 NOTE — Telephone Encounter (Signed)
Spoke with pt, she has had trouble with elevated blood pressure. Today her bp is 144/101 and 144/91. She also reports off and on atrial fib today with rates 105-106 and then back down to 80. She is also c/o dizziness when up moving around. She has taken sotalol 80 mg this morning, she has not taken her furosemide. She would like to see dr turner asap. Patient unable to make the opening to see dr turner on Monday so she will see lori gerhardt np Monday. Patient advised if continues to have bp problems over the weekend she should go to the [facility] or ER as she ios currently on no bp meds. Advised patient to take her furosemide.

## 2016-08-28 NOTE — Telephone Encounter (Signed)
Pt c/o BP issue: STAT if pt c/o blurred vision, one-sided weakness or slurred speech  1. What are your last 5 BP readings? 144/101   HR 1022. Are you having any other symptoms (ex. Dizziness, headache, blurred vision, passed out)?  Dizziness & had headaches 3. What is your BP issue? BP high

## 2016-08-31 ENCOUNTER — Ambulatory Visit: Payer: Medicare Other | Admitting: Nurse Practitioner

## 2016-08-31 ENCOUNTER — Telehealth: Payer: Self-pay | Admitting: Cardiology

## 2016-08-31 ENCOUNTER — Ambulatory Visit (INDEPENDENT_AMBULATORY_CARE_PROVIDER_SITE_OTHER): Payer: Medicare Other | Admitting: *Deleted

## 2016-08-31 DIAGNOSIS — Z5181 Encounter for therapeutic drug level monitoring: Secondary | ICD-10-CM | POA: Diagnosis not present

## 2016-08-31 DIAGNOSIS — I4891 Unspecified atrial fibrillation: Secondary | ICD-10-CM | POA: Diagnosis not present

## 2016-08-31 LAB — POCT INR: INR: 2.7

## 2016-08-31 NOTE — Telephone Encounter (Signed)
Patient called to report that she cancelled her OV with Cecille Rubin today because she "only wants to see Dr. Radford Pax." Patient states that on Friday, she had a few dizzy spells. She felt like her HR was high and she was having palpitations. Her BP was 147/101. She later checked her VS and her HR was in the 50s and BP 118/50s. She reports today she is asymptomatic.  She is taking her medications as directed.  Again, offered patient ASAP OV with APP. She declined, stating she feels fine today and will call if symptoms reoccur.   She understands she will be called with Dr. Theodosia Blender recommendations.

## 2016-08-31 NOTE — Progress Notes (Deleted)
CARDIOLOGY OFFICE NOTE  Date:  08/31/2016    Molly [name] Date of Birth: [birth year] Medical Record [id number]  PCP:  Gara Kroner, MD  Cardiologist:  Servando Snare & ***    No chief complaint on file.   History of Present Illness: Molly [name] is a 82 y.o. female who presents today for a ***  with a history of frequent PVCs, and atrial fibrillation. The patient has been on multiple medications in attempt to control her arrhythmias.  She has been placed on sotalol.  She had fatigue with calcium channel blockers. In the interim she notes fatigue and weakness have worsened. No syncope, and no chest pain. She has minimal dyspnea with exertion and peripheral edema. She admits to being sedentary. She does not appear to appreciate when her heart races.   Phone call last week - "Spoke with pt, she has had trouble with elevated blood pressure. Today her bp is 144/101 and 144/91. She also reports off and on atrial fib today with rates 105-106 and then back down to 80. She is also c/o dizziness when up moving around. She has taken sotalol 80 mg this morning, she has not taken her furosemide. She would like to see dr turner asap. Patient unable to make the opening to see dr turner on Monday so she will see Shyrl Obi np Monday. Patient advised if continues to have bp problems over the weekend she should go to the [facility] or ER as she ios currently on no bp meds. Advised patient to take her furosemide."  Thus added to my schedule.   Comes in today. Here with   Past Medical History:  Diagnosis Date  . Atrial fibrillation (HCC)    paroxysmal  . Cancer (HCC)    Skin cancer- basal.  1 mylenoma  . Complication of anesthesia   . Dysrhythmia    PAF  . GERD (gastroesophageal reflux disease)   . H/O benign essential tremor    on amiodarone resolved on lower dose of amio  . H/O hyperkalemia    Resolved off ACE inhibitors  . High cholesterol   . History of blood transfusion   .  Hyperparathyroidism ([place])   . Hypertension   . LV dysfunction    MILD-EF40-45% 2003 now EF 50-55%  . Mitral regurgitation 11/06/2015  . PAF (paroxysmal atrial fibrillation) ([place])   . PONV (postoperative nausea and vomiting)   . PVC's (premature ventricular contractions)   . Renal insufficiency    MILD  . Shortness of breath    with exertion  . Tachycardia-bradycardia syndrome [facility])    s/p PPM 02/2013    Past Surgical History:  Procedure Laterality Date  . AMPUTATION Right 11/27/2014   Procedure: RIGHT 2ND AND 3RD TOE AMPUTATION;  Surgeon: Wylene Simmer, MD;  Location: [place];  Service: Orthopedics;  Laterality: Right;  . BUNIONECTOMY     Right  . CARDIAC CATHETERIZATION  09/25/2000   EF of 50% -- with normal left ventricular size and function  . CESAREAN SECTION    . CESAREAN SECTION    . EYE SURGERY Bilateral    Cataract  . FOOT SURGERY    . INSERT / REPLACE / REMOVE PACEMAKER  02/2013  . PACEMAKER INSERTION  02/2013  . PERMANENT PACEMAKER INSERTION N/A 03/02/2013   Procedure: PERMANENT PACEMAKER INSERTION;  Surgeon: Evans Lance, MD;  Location: [facility] CATH LAB;  Service: Cardiovascular;  Laterality: N/A;  . SHOULDER ARTHROSCOPY W/ ROTATOR CUFF REPAIR    .  SHOULDER SURGERY Right    roto cuff  . TOE AMPUTATION Left    2nd and 3rd, bunion under toes.  . TONSILLECTOMY    . VARICOSE VEIN SURGERY       Medications: Current Outpatient Prescriptions  Medication Sig Dispense Refill  . acetaminophen (TYLENOL) 500 MG tablet Take 1,000 mg by mouth every 6 (six) hours as needed. For pain    . atorvastatin (LIPITOR) 10 MG tablet Take 10 mg by mouth every evening.    . dorzolamide (TRUSOPT) 2 % ophthalmic solution Place 1 drop into the right eye 2 (two) times daily.    . furosemide (LASIX) 20 MG tablet Take 20 mg by mouth daily.    Marland Kitchen latanoprost (XALATAN) 0.005 % ophthalmic solution Place 1 drop into the right eye at bedtime.    . meclizine (ANTIVERT) 25 MG tablet Take 25 mg by mouth 3  (three) times daily as needed for dizziness.    . Multiple Vitamin (MULTIVITAMIN WITH MINERALS) TABS Take 1 tablet by mouth daily.    Marland Kitchen omeprazole (PRILOSEC) 20 MG capsule Take 20 mg by mouth daily.    . sotalol (BETAPACE) 80 MG tablet Take 1.5 tablets (120 mg total) by mouth at bedtime. 60 tablet 5  . warfarin (COUMADIN) 5 MG tablet TAKE 1/2-1 TABLET DAILY AS DIRECTED BY ANTICOAGULATION CLINIC 30 tablet 3   No current facility-administered medications for this visit.     Allergies: Allergies  Allergen Reactions  . Tramadol Nausea Only    Social History: The patient  reports that she has never smoked. She has never used smokeless tobacco. She reports that she does not drink alcohol or use drugs.   Family History: The patient's family history includes Hypertension in her father and mother.   Review of Systems: Please see the history of present illness.   Otherwise, the review of systems is positive for none.   All other systems are reviewed and negative.   Physical Exam: VS:  There were no vitals taken for this visit. Marland Kitchen  BMI There is no height or weight on file to calculate BMI.  Wt Readings from Last 3 Encounters:  03/17/16 185 lb 1.9 oz (84 kg)  12/31/15 181 lb (82.1 kg)  11/06/15 181 lb 12.8 oz (82.5 kg)    General: Pleasant. Well developed, well nourished and in no acute distress.   HEENT: Normal.  Neck: Supple, no JVD, carotid bruits, or masses noted.  Cardiac: Regular rate and rhythm. No murmurs, rubs, or gallops. No edema.  Respiratory:  Lungs are clear to auscultation bilaterally with normal work of breathing.  GI: Soft and nontender.  MS: No deformity or atrophy. Gait and ROM intact.  Skin: Warm and dry. Color is normal.  Neuro:  Strength and sensation are intact and no gross focal deficits noted.  Psych: Alert, appropriate and with normal affect.   LABORATORY DATA:  EKG:  EKG is ordered today. This demonstrates .  Lab Results  Component Value Date   WBC 8.2  11/26/2014   HGB 13.1 11/26/2014   HCT 41.0 11/26/2014   PLT 191 11/26/2014   GLUCOSE 93 11/26/2014   CHOL 156 05/23/2012   TRIG 86 05/23/2012   HDL 80 05/23/2012   LDLCALC 59 05/23/2012   ALT 11 02/28/2013   AST 26 02/28/2013   NA 139 11/26/2014   K 4.8 11/26/2014   CL 108 11/26/2014   CREATININE 1.09 11/26/2014   BUN 23 11/26/2014   CO2 27 11/26/2014  TSH 3.338 02/28/2013   INR 2.7 07/20/2016   HGBA1C 5.7 (H) 05/23/2012    BNP (last 3 results) No results for input(s): BNP in the last 8760 hours.  ProBNP (last 3 results) No results for input(s): PROBNP in the last 8760 hours.   Other Studies Reviewed Today:  Myoview Study Highlights from 12/2015   There was no ST segment deviation noted during stress.  The study is normal. There is no ischemia.  This is a low risk study.  The study could not be gated. No LV function information is available . The LV cavity size is normal   Echo Study Conclusions from 11/2015  - Left ventricle: Inferior wall hypokinesis. The cavity size was   moderately dilated. Systolic function was mildly to moderately   reduced. The estimated ejection fraction was in the range of 40%   to 45%. Wall motion was normal; there were no regional wall   motion abnormalities. - Aortic valve: There was mild regurgitation. - Mitral valve: There was mild to moderate regurgitation.  Assessment/Plan: 1. PAF/Flutter - she is mostly in NSR. She will continue her current meds but I have asked her to reduce her dose of sotalol down to 120 mg daily. 2. PM - her [place] DDD PM is working normally.  3. HTN - her blood pressure is elevated. I will defer additional treatment to Dr. Radford Pax  Current medicines are reviewed with the patient today.  The patient does not have concerns regarding medicines other than what has been noted above.  The following changes have been made:  See above.  Labs/ tests ordered today include:   No orders of the defined types  were placed in this encounter.    Disposition:   FU with    Patient is agreeable to this plan and will call if any problems develop in the interim.   Signed: Burtis Junes, RN, ANP-C 08/31/2016 8:16 AM  [facility] [address] [place], [place]  [address] Phone: [phone] Fax: [phone]

## 2016-08-31 NOTE — Telephone Encounter (Signed)
New message      Pt cancelled her appt with the NP because she want to see Dr Radford Pax only.  She is complaining about irr heart beat.  Pt want to know if you can work her in this week or next week?

## 2016-09-09 DIAGNOSIS — L570 Actinic keratosis: Secondary | ICD-10-CM | POA: Diagnosis not present

## 2016-09-09 DIAGNOSIS — Z85828 Personal history of other malignant neoplasm of skin: Secondary | ICD-10-CM | POA: Diagnosis not present

## 2016-09-09 DIAGNOSIS — L821 Other seborrheic keratosis: Secondary | ICD-10-CM | POA: Diagnosis not present

## 2016-09-15 ENCOUNTER — Ambulatory Visit (INDEPENDENT_AMBULATORY_CARE_PROVIDER_SITE_OTHER): Payer: Medicare Other | Admitting: *Deleted

## 2016-09-15 DIAGNOSIS — I495 Sick sinus syndrome: Secondary | ICD-10-CM | POA: Diagnosis not present

## 2016-09-15 NOTE — Progress Notes (Signed)
Remote pacemaker transmission.   

## 2016-09-21 DIAGNOSIS — H02401 Unspecified ptosis of right eyelid: Secondary | ICD-10-CM | POA: Diagnosis not present

## 2016-09-24 ENCOUNTER — Encounter: Payer: Self-pay | Admitting: Cardiology

## 2016-10-07 LAB — CUP PACEART REMOTE DEVICE CHECK
Battery Remaining Longevity: 120 mo
Battery Remaining Percentage: 100 %
Date Time Interrogation Session: 20171121074100
Implantable Lead Implant Date: 20140508
Implantable Lead Location: 753859
Implantable Lead Location: 753860
Implantable Lead Serial Number: 29379474
Implantable Pulse Generator Implant Date: 20140508
Lead Channel Impedance Value: 681 Ohm
Lead Channel Pacing Threshold Pulse Width: 0.4 ms
Lead Channel Setting Pacing Amplitude: 2.4 V
Lead Channel Setting Sensing Sensitivity: 2.5 mV
MDC IDC LEAD IMPLANT DT: 20140508
MDC IDC LEAD MODEL: 4135
MDC IDC LEAD MODEL: 4136
MDC IDC LEAD SERIAL: 29333020
MDC IDC MSMT LEADCHNL RA IMPEDANCE VALUE: 641 Ohm
MDC IDC MSMT LEADCHNL RA PACING THRESHOLD AMPLITUDE: 1 V
MDC IDC PG SERIAL: 112392
MDC IDC SET LEADCHNL RA PACING AMPLITUDE: 2 V
MDC IDC SET LEADCHNL RV PACING PULSEWIDTH: 0.4 ms
MDC IDC STAT BRADY RA PERCENT PACED: 89 %
MDC IDC STAT BRADY RV PERCENT PACED: 14 %

## 2016-10-12 ENCOUNTER — Ambulatory Visit (INDEPENDENT_AMBULATORY_CARE_PROVIDER_SITE_OTHER): Payer: Medicare Other | Admitting: *Deleted

## 2016-10-12 DIAGNOSIS — I4891 Unspecified atrial fibrillation: Secondary | ICD-10-CM | POA: Diagnosis not present

## 2016-10-12 DIAGNOSIS — Z5181 Encounter for therapeutic drug level monitoring: Secondary | ICD-10-CM

## 2016-10-12 LAB — POCT INR: INR: 3.3

## 2016-10-27 ENCOUNTER — Other Ambulatory Visit: Payer: Self-pay | Admitting: Cardiology

## 2016-11-10 ENCOUNTER — Ambulatory Visit (INDEPENDENT_AMBULATORY_CARE_PROVIDER_SITE_OTHER): Payer: Medicare Other | Admitting: *Deleted

## 2016-11-10 ENCOUNTER — Encounter: Payer: Self-pay | Admitting: Cardiology

## 2016-11-10 ENCOUNTER — Ambulatory Visit (INDEPENDENT_AMBULATORY_CARE_PROVIDER_SITE_OTHER): Payer: Medicare Other | Admitting: Cardiology

## 2016-11-10 VITALS — BP 122/84 | HR 72 | Ht 65.0 in | Wt 188.0 lb

## 2016-11-10 DIAGNOSIS — I493 Ventricular premature depolarization: Secondary | ICD-10-CM

## 2016-11-10 DIAGNOSIS — I481 Persistent atrial fibrillation: Secondary | ICD-10-CM

## 2016-11-10 DIAGNOSIS — I10 Essential (primary) hypertension: Secondary | ICD-10-CM | POA: Diagnosis not present

## 2016-11-10 DIAGNOSIS — I4819 Other persistent atrial fibrillation: Secondary | ICD-10-CM

## 2016-11-10 DIAGNOSIS — I4891 Unspecified atrial fibrillation: Secondary | ICD-10-CM | POA: Diagnosis not present

## 2016-11-10 DIAGNOSIS — I351 Nonrheumatic aortic (valve) insufficiency: Secondary | ICD-10-CM

## 2016-11-10 DIAGNOSIS — I340 Nonrheumatic mitral (valve) insufficiency: Secondary | ICD-10-CM | POA: Diagnosis not present

## 2016-11-10 DIAGNOSIS — Z5181 Encounter for therapeutic drug level monitoring: Secondary | ICD-10-CM

## 2016-11-10 DIAGNOSIS — R001 Bradycardia, unspecified: Secondary | ICD-10-CM

## 2016-11-10 DIAGNOSIS — I420 Dilated cardiomyopathy: Secondary | ICD-10-CM | POA: Insufficient documentation

## 2016-11-10 LAB — POCT INR: INR: 2.3

## 2016-11-10 NOTE — Progress Notes (Signed)
Cardiology Office Note    Date:  11/10/2016   ID:  Molly [name], DOB 06/05/1933, MRN [id number]  PCP:  Gara Kroner, MD  Cardiologist:  Fransico Him, MD   Chief Complaint  Patient presents with  . Atrial Fibrillation  . Hypertension  . Mitral Regurgitation    History of Present Illness:  Molly [name] is a 83 y.o. female with a history of paroxysmal atrial fibrillation on chronic systemic anticoagulation, HTN, symptomatic PVC's, low normal LVF EF 50-55%, tachybrady syndrome s/p PPM who presents today for followup. She is doing well from a cardiac standpoint. .  She denies any chest pain, PND, orthopnea, DOE (except with extreme walking), claudication or syncope. She occasionally has some LE edema which is fairly controlled on Lasix. She occasionally has some skipped heart beats at night.  SHe recently had some problems with dizziness in the am for a few days but that resolved and has not had any more problems in a month.  Past Medical History:  Diagnosis Date  . Aortic insufficiency    mild by echo 2017  . Cancer ([place])    Skin cancer- basal.  1 mylenoma  . Complication of anesthesia   . DCM (dilated cardiomyopathy) ([place])    EF 40-45% by echo 2017 - nuclear stress test with no ischemia  . GERD (gastroesophageal reflux disease)   . H/O benign essential tremor    on amiodarone resolved off amio  . H/O hyperkalemia    Resolved off ACE inhibitors  . High cholesterol   . History of blood transfusion   . Hyperparathyroidism ([place])   . Hypertension   . Mitral regurgitation    mild to moderate by echo 2017  . Persistent atrial fibrillation ([place])   . PONV (postoperative nausea and vomiting)   . PVC's (premature ventricular contractions)   . Renal insufficiency    MILD  . Shortness of breath    with exertion  . Tachycardia-bradycardia syndrome [facility])    s/p PPM 02/2013    Past Surgical History:  Procedure Laterality Date  . AMPUTATION Right 11/27/2014   Procedure:  RIGHT 2ND AND 3RD TOE AMPUTATION;  Surgeon: Wylene Simmer, MD;  Location: [place];  Service: Orthopedics;  Laterality: Right;  . BUNIONECTOMY     Right  . CARDIAC CATHETERIZATION  09/25/2000   EF of 50% -- with normal left ventricular size and function  . CESAREAN SECTION    . CESAREAN SECTION    . EYE SURGERY Bilateral    Cataract  . FOOT SURGERY    . INSERT / REPLACE / REMOVE PACEMAKER  02/2013  . PACEMAKER INSERTION  02/2013  . PERMANENT PACEMAKER INSERTION N/A 03/02/2013   Procedure: PERMANENT PACEMAKER INSERTION;  Surgeon: Evans Lance, MD;  [facility] C[facility] LAB;  Service: Cardiovascular;  Laterality: N/A;  . SHOULDER ARTHROSCOPY W/ ROTATOR CUFF REPAIR    . SHOULDER SURGERY Right    roto cuff  . TOE AMPUTATION Left    2nd and 3rd, bunion under toes.  . TONSILLECTOMY    . VARICOSE VEIN SURGERY      Current Medications: Outpatient Medications Prior to Visit  Medication Sig Dispense Refill  . acetaminophen (TYLENOL) 500 MG tablet Take 1,000 mg by mouth every 6 (six) hours as needed. For pain    . atorvastatin (LIPITOR) 10 MG tablet Take 10 mg by mouth every evening.    . dorzolamide (TRUSOPT) 2 % ophthalmic solution Place 1 drop into the right eye 2 (  two) times daily.    . furosemide (LASIX) 20 MG tablet Take 20 mg by mouth daily.    Marland Kitchen latanoprost (XALATAN) 0.005 % ophthalmic solution Place 1 drop into the right eye at bedtime.    . Multiple Vitamin (MULTIVITAMIN WITH MINERALS) TABS Take 1 tablet by mouth daily.    Marland Kitchen omeprazole (PRILOSEC) 20 MG capsule Take 20 mg by mouth daily.    . sotalol (BETAPACE) 80 MG tablet Take 1.5 tablets (120 mg total) by mouth at bedtime. 60 tablet 5  . warfarin (COUMADIN) 5 MG tablet TAKE 1/2-1 TABLET DAILY AS DIRECTED BY ANTICOAGULATION CLINIC 30 tablet 3  . meclizine (ANTIVERT) 25 MG tablet Take 25 mg by mouth 3 (three) times daily as needed for dizziness.     No facility-administered medications prior to visit.      Allergies:   Tramadol    Social History   Social History  . Marital status: Married    Spouse name: N/A  . Number of children: N/A  . Years of education: N/A   Social History Main Topics  . Smoking status: Never Smoker  . Smokeless tobacco: Never Used  . Alcohol use No  . Drug use: No  . Sexual activity: Not Asked   Other Topics Concern  . None   Social History Narrative  . None     Family History:  The patient's family history includes Hypertension in her father and mother.   ROS:   Please see the history of present illness.    ROS All other systems reviewed and are negative.  No flowsheet data found.     PHYSICAL EXAM:   VS:  BP 122/84   Pulse 72   Ht 5\' 5"  (1.651 m)   Wt 188 lb (85.3 kg)   BMI 31.28 kg/m    GEN: Well nourished, well developed, in no acute distress  HEENT: normal  Neck: no JVD, carotid bruits, or masses Cardiac: RRR; no murmurs, rubs, or gallops,no edema.  Intact distal pulses bilaterally.  Respiratory:  clear to auscultation bilaterally, normal work of breathing GI: soft, nontender, nondistended, + BS MS: no deformity or atrophy  Skin: warm and dry, no rash Neuro:  Alert and Oriented x 3, Strength and sensation are intact Psych: euthymic mood, full affect  Wt Readings from Last 3 Encounters:  11/10/16 188 lb (85.3 kg)  03/17/16 185 lb 1.9 oz (84 kg)  12/31/15 181 lb (82.1 kg)      Studies/Labs Reviewed:   EKG:  EKG is not ordered today.    Recent Labs: No results found for requested labs within last 8760 hours.   Lipid Panel    Component Value Date/Time   CHOL 156 05/23/2012 0514   TRIG 86 05/23/2012 0514   HDL 80 05/23/2012 0514   CHOLHDL 2.0 05/23/2012 0514   VLDL 17 05/23/2012 0514   LDLCALC 59 05/23/2012 0514    Additional studies/ records that were reviewed today include:  none    ASSESSMENT:    1. Persistent atrial fibrillation ([place])   2. Essential hypertension   3. PVC (premature ventricular contraction)   4. Mitral valve  insufficiency, unspecified etiology   5. Symptomatic bradycardia   6. Aortic valve insufficiency, etiology of cardiac valve disease unspecified   7. DCM (dilated cardiomyopathy) ([place])      PLAN:  In order of problems listed above:  1. Persistent atrial fibrillation - she is maintaining NSR with 2% PAF on device check as well as atrial  flutter.  Continue Sotolol and warfarin.  2. HTN - BP controlled on low sodium diet alone.  3. PVC's - fairly well tolerated. 4. Mild to moderate MR by echo 11/2015 - I will repeat echo to make sure MR has not progressed. 5. Symptomatic bradycardia s/p PPM - followed in device clinic.  Last device check in November showed 2% PAF and some aflutter. 6.   Mild AI  7.   DCM with EF 40-45% by echo a year ago.  Nuclear stress test showed on ischemia.  ? Secondary to RV pacing.  Will repeat echo to reassess LVF.     Medication Adjustments/Labs and Tests Ordered: Current medicines are reviewed at length with the patient today.  Concerns regarding medicines are outlined above.  Medication changes, Labs and Tests ordered today are listed in the Patient Instructions below.  There are no Patient Instructions on file for this visit.   Signed, Fransico Him, MD  11/10/2016 2:31 PM    [address], [place], [place]  [address] Phone: [phone]; Fax: [phone]

## 2016-11-10 NOTE — Patient Instructions (Signed)
Medication Instructions:  Your physician recommends that you continue on your current medications as directed. Please refer to the Current Medication list given to you today.   Labwork: None  Testing/Procedures: Your physician has requested that you have an echocardiogram. Echocardiography is a painless test that uses sound waves to create images of your heart. It provides your doctor with information about the size and shape of your heart and how well your heart's chambers and valves are working. This procedure takes approximately one hour. There are no restrictions for this procedure.  Follow-Up: Your physician wants you to follow-up in: 6 months with Dr. Turner's assistant. You will receive a reminder letter in the mail two months in advance. If you don't receive a letter, please call our office to schedule the follow-up appointment.   Your physician wants you to follow-up in: 1 year with Dr. Turner. You will receive a reminder letter in the mail two months in advance. If you don't receive a letter, please call our office to schedule the follow-up appointment.   Any Other Special Instructions Will Be Listed Below (If Applicable).     If you need a refill on your cardiac medications before your next appointment, please call your pharmacy.   

## 2016-12-05 ENCOUNTER — Other Ambulatory Visit: Payer: Self-pay | Admitting: Internal Medicine

## 2016-12-05 DIAGNOSIS — I480 Paroxysmal atrial fibrillation: Secondary | ICD-10-CM

## 2016-12-15 ENCOUNTER — Ambulatory Visit (INDEPENDENT_AMBULATORY_CARE_PROVIDER_SITE_OTHER): Payer: Medicare Other | Admitting: *Deleted

## 2016-12-15 DIAGNOSIS — H52203 Unspecified astigmatism, bilateral: Secondary | ICD-10-CM | POA: Diagnosis not present

## 2016-12-15 DIAGNOSIS — H401412 Capsular glaucoma with pseudoexfoliation of lens, right eye, moderate stage: Secondary | ICD-10-CM | POA: Diagnosis not present

## 2016-12-15 DIAGNOSIS — I495 Sick sinus syndrome: Secondary | ICD-10-CM

## 2016-12-15 DIAGNOSIS — Z961 Presence of intraocular lens: Secondary | ICD-10-CM | POA: Diagnosis not present

## 2016-12-15 DIAGNOSIS — H02401 Unspecified ptosis of right eyelid: Secondary | ICD-10-CM | POA: Diagnosis not present

## 2016-12-15 NOTE — Progress Notes (Signed)
Remote pacemaker transmission.   

## 2016-12-16 ENCOUNTER — Encounter: Payer: Self-pay | Admitting: Cardiology

## 2016-12-16 LAB — CUP PACEART REMOTE DEVICE CHECK
Brady Statistic RA Percent Paced: 89 %
Date Time Interrogation Session: 20180220074300
Implantable Lead Implant Date: 20140508
Implantable Lead Location: 753859
Implantable Lead Location: 753860
Implantable Lead Model: 4136
Implantable Pulse Generator Implant Date: 20140508
Lead Channel Impedance Value: 640 Ohm
Lead Channel Impedance Value: 674 Ohm
Lead Channel Pacing Threshold Amplitude: 1 V
Lead Channel Setting Pacing Amplitude: 2.4 V
MDC IDC LEAD IMPLANT DT: 20140508
MDC IDC LEAD SERIAL: 29333020
MDC IDC LEAD SERIAL: 29379474
MDC IDC MSMT BATTERY REMAINING LONGEVITY: 114 mo
MDC IDC MSMT BATTERY REMAINING PERCENTAGE: 100 %
MDC IDC MSMT LEADCHNL RA PACING THRESHOLD PULSEWIDTH: 0.4 ms
MDC IDC SET LEADCHNL RA PACING AMPLITUDE: 2 V
MDC IDC SET LEADCHNL RV PACING PULSEWIDTH: 0.4 ms
MDC IDC SET LEADCHNL RV SENSING SENSITIVITY: 2.5 mV
MDC IDC STAT BRADY RV PERCENT PACED: 13 %
Pulse Gen Serial Number: 112392

## 2016-12-21 ENCOUNTER — Ambulatory Visit (HOSPITAL_COMMUNITY): Payer: Medicare Other | Attending: Cardiovascular Disease

## 2016-12-21 ENCOUNTER — Ambulatory Visit (INDEPENDENT_AMBULATORY_CARE_PROVIDER_SITE_OTHER): Payer: Medicare Other | Admitting: *Deleted

## 2016-12-21 ENCOUNTER — Other Ambulatory Visit: Payer: Self-pay

## 2016-12-21 DIAGNOSIS — I4819 Other persistent atrial fibrillation: Secondary | ICD-10-CM

## 2016-12-21 DIAGNOSIS — I361 Nonrheumatic tricuspid (valve) insufficiency: Secondary | ICD-10-CM | POA: Insufficient documentation

## 2016-12-21 DIAGNOSIS — I351 Nonrheumatic aortic (valve) insufficiency: Secondary | ICD-10-CM | POA: Insufficient documentation

## 2016-12-21 DIAGNOSIS — I517 Cardiomegaly: Secondary | ICD-10-CM | POA: Diagnosis not present

## 2016-12-21 DIAGNOSIS — Z5181 Encounter for therapeutic drug level monitoring: Secondary | ICD-10-CM

## 2016-12-21 DIAGNOSIS — I4891 Unspecified atrial fibrillation: Secondary | ICD-10-CM

## 2016-12-21 DIAGNOSIS — I340 Nonrheumatic mitral (valve) insufficiency: Secondary | ICD-10-CM | POA: Insufficient documentation

## 2016-12-21 DIAGNOSIS — I481 Persistent atrial fibrillation: Secondary | ICD-10-CM | POA: Diagnosis not present

## 2016-12-21 DIAGNOSIS — R9439 Abnormal result of other cardiovascular function study: Secondary | ICD-10-CM | POA: Insufficient documentation

## 2016-12-21 LAB — POCT INR: INR: 3.1

## 2016-12-24 ENCOUNTER — Telehealth: Payer: Self-pay | Admitting: Cardiology

## 2016-12-24 DIAGNOSIS — I340 Nonrheumatic mitral (valve) insufficiency: Secondary | ICD-10-CM

## 2016-12-24 NOTE — Telephone Encounter (Signed)
I spoke with patient regarding her reports of increasing afib. After reviewing her most recent remote transmission I explained that per her device her AFib burden is actually lower than her past checks. I offered to re-schedule her with an earlier appointment to see doctor taylor but she denied at this time and stated that she would see how she felt over the next couple of days and call back if she felt she needed to move up her appointment.

## 2016-12-24 NOTE — Telephone Encounter (Signed)
-----   Message from Sueanne Margarita, MD sent at 12/21/2016  8:06 PM EST ----- Echo showed mild to moderate LV dysfunction with EF 40-45% with diffuse HK, mild to moderate MR, mild biatrial enlargement, moderate TR and very mild pulmonary HTN. No significant change from a year ago.  Repeat echo in 1 year for MR

## 2016-12-24 NOTE — Telephone Encounter (Signed)
Follow Up    Returning phone call from nurse regarding echo results

## 2016-12-24 NOTE — Telephone Encounter (Signed)
Informed patient of results and verbal understanding expressed.  Repeat ECHO ordered to be scheduled in 1 year. Patient agrees with treatment plan.  While on the phone, patient reported increased frequency of Afib, especially at night.  She wakes up the next day exhausted and it takes her a long time to feel "normal" to get about her day. She requests a call from the [facility] to discuss her options (she states she sent a remote 2/20).

## 2017-01-11 DIAGNOSIS — G8929 Other chronic pain: Secondary | ICD-10-CM | POA: Diagnosis not present

## 2017-01-11 DIAGNOSIS — M545 Low back pain: Secondary | ICD-10-CM | POA: Diagnosis not present

## 2017-01-21 DIAGNOSIS — M47816 Spondylosis without myelopathy or radiculopathy, lumbar region: Secondary | ICD-10-CM | POA: Diagnosis not present

## 2017-02-10 ENCOUNTER — Telehealth: Payer: Self-pay | Admitting: Pharmacist

## 2017-02-10 ENCOUNTER — Ambulatory Visit (INDEPENDENT_AMBULATORY_CARE_PROVIDER_SITE_OTHER): Payer: Medicare Other | Admitting: *Deleted

## 2017-02-10 DIAGNOSIS — Z5181 Encounter for therapeutic drug level monitoring: Secondary | ICD-10-CM

## 2017-02-10 DIAGNOSIS — I4891 Unspecified atrial fibrillation: Secondary | ICD-10-CM

## 2017-02-10 DIAGNOSIS — I481 Persistent atrial fibrillation: Secondary | ICD-10-CM

## 2017-02-10 DIAGNOSIS — I4819 Other persistent atrial fibrillation: Secondary | ICD-10-CM

## 2017-02-10 LAB — POCT INR: INR: 2.9

## 2017-02-10 NOTE — Telephone Encounter (Signed)
Pt presented today for INR check and reports that she is having a procedure on 02/16/17 with Dr. Nelva Bush. She reports that she was told not to hold Coumadin, but believes that it will be a spinal injection.   Called and spoke with Tameka at Dr. Nelva Bush and she states that pt will need to be off warfarin for 5 days prior to procedure.   Pt on warfarin for Afib with CHADS<4 and no history of stroke/TIA. OK to hold 5 days prior to procedure. Resume evening of procedure or as directed by MD.

## 2017-02-15 ENCOUNTER — Ambulatory Visit (INDEPENDENT_AMBULATORY_CARE_PROVIDER_SITE_OTHER): Payer: Medicare Other | Admitting: *Deleted

## 2017-02-15 DIAGNOSIS — I481 Persistent atrial fibrillation: Secondary | ICD-10-CM | POA: Diagnosis not present

## 2017-02-15 DIAGNOSIS — Z5181 Encounter for therapeutic drug level monitoring: Secondary | ICD-10-CM | POA: Diagnosis not present

## 2017-02-15 DIAGNOSIS — I4819 Other persistent atrial fibrillation: Secondary | ICD-10-CM

## 2017-02-15 DIAGNOSIS — I4891 Unspecified atrial fibrillation: Secondary | ICD-10-CM | POA: Diagnosis not present

## 2017-02-15 LAB — POCT INR: INR: 1.2

## 2017-02-16 DIAGNOSIS — M47816 Spondylosis without myelopathy or radiculopathy, lumbar region: Secondary | ICD-10-CM | POA: Diagnosis not present

## 2017-02-22 ENCOUNTER — Inpatient Hospital Stay (HOSPITAL_COMMUNITY)
Admission: EM | Admit: 2017-02-22 | Discharge: 2017-02-25 | DRG: 309 | Disposition: A | Discharge status: Home / Self Care | Payer: Medicare Other | Attending: Internal Medicine | Admitting: Internal Medicine

## 2017-02-22 ENCOUNTER — Emergency Department (HOSPITAL_COMMUNITY): Payer: Medicare Other

## 2017-02-22 ENCOUNTER — Telehealth: Payer: Self-pay | Admitting: Internal Medicine

## 2017-02-22 ENCOUNTER — Encounter (HOSPITAL_COMMUNITY): Payer: Self-pay | Admitting: Emergency Medicine

## 2017-02-22 DIAGNOSIS — I481 Persistent atrial fibrillation: Secondary | ICD-10-CM | POA: Diagnosis not present

## 2017-02-22 DIAGNOSIS — I130 Hypertensive heart and chronic kidney disease with heart failure and stage 1 through stage 4 chronic kidney disease, or unspecified chronic kidney disease: Secondary | ICD-10-CM | POA: Diagnosis not present

## 2017-02-22 DIAGNOSIS — I083 Combined rheumatic disorders of mitral, aortic and tricuspid valves: Secondary | ICD-10-CM | POA: Diagnosis present

## 2017-02-22 DIAGNOSIS — I420 Dilated cardiomyopathy: Secondary | ICD-10-CM | POA: Diagnosis not present

## 2017-02-22 DIAGNOSIS — I10 Essential (primary) hypertension: Secondary | ICD-10-CM | POA: Diagnosis not present

## 2017-02-22 DIAGNOSIS — Z6831 Body mass index (BMI) 31.0-31.9, adult: Secondary | ICD-10-CM

## 2017-02-22 DIAGNOSIS — R0602 Shortness of breath: Secondary | ICD-10-CM

## 2017-02-22 DIAGNOSIS — R9082 White matter disease, unspecified: Secondary | ICD-10-CM | POA: Diagnosis not present

## 2017-02-22 DIAGNOSIS — E7800 Pure hypercholesterolemia, unspecified: Secondary | ICD-10-CM | POA: Diagnosis present

## 2017-02-22 DIAGNOSIS — I480 Paroxysmal atrial fibrillation: Secondary | ICD-10-CM | POA: Diagnosis not present

## 2017-02-22 DIAGNOSIS — I4819 Other persistent atrial fibrillation: Secondary | ICD-10-CM | POA: Diagnosis present

## 2017-02-22 DIAGNOSIS — N183 Chronic kidney disease, stage 3 (moderate): Secondary | ICD-10-CM | POA: Diagnosis present

## 2017-02-22 DIAGNOSIS — Z888 Allergy status to other drugs, medicaments and biological substances status: Secondary | ICD-10-CM

## 2017-02-22 DIAGNOSIS — I351 Nonrheumatic aortic (valve) insufficiency: Secondary | ICD-10-CM | POA: Diagnosis present

## 2017-02-22 DIAGNOSIS — E669 Obesity, unspecified: Secondary | ICD-10-CM | POA: Diagnosis present

## 2017-02-22 DIAGNOSIS — R51 Headache: Secondary | ICD-10-CM | POA: Diagnosis not present

## 2017-02-22 DIAGNOSIS — I4891 Unspecified atrial fibrillation: Secondary | ICD-10-CM | POA: Diagnosis not present

## 2017-02-22 DIAGNOSIS — I5022 Chronic systolic (congestive) heart failure: Secondary | ICD-10-CM | POA: Diagnosis present

## 2017-02-22 DIAGNOSIS — Z950 Presence of cardiac pacemaker: Secondary | ICD-10-CM

## 2017-02-22 DIAGNOSIS — K219 Gastro-esophageal reflux disease without esophagitis: Secondary | ICD-10-CM | POA: Diagnosis present

## 2017-02-22 DIAGNOSIS — Z8249 Family history of ischemic heart disease and other diseases of the circulatory system: Secondary | ICD-10-CM

## 2017-02-22 DIAGNOSIS — E785 Hyperlipidemia, unspecified: Secondary | ICD-10-CM | POA: Diagnosis present

## 2017-02-22 DIAGNOSIS — Z7901 Long term (current) use of anticoagulants: Secondary | ICD-10-CM

## 2017-02-22 DIAGNOSIS — Z85828 Personal history of other malignant neoplasm of skin: Secondary | ICD-10-CM

## 2017-02-22 DIAGNOSIS — I340 Nonrheumatic mitral (valve) insufficiency: Secondary | ICD-10-CM | POA: Diagnosis present

## 2017-02-22 LAB — BASIC METABOLIC PANEL
Anion gap: 9 (ref 5–15)
BUN: 32 mg/dL — AB (ref 6–20)
CO2: 22 mmol/L (ref 22–32)
Calcium: 10.5 mg/dL — ABNORMAL HIGH (ref 8.9–10.3)
Chloride: 107 mmol/L (ref 101–111)
Creatinine, Ser: 1.18 mg/dL — ABNORMAL HIGH (ref 0.44–1.00)
GFR calc Af Amer: 49 mL/min — ABNORMAL LOW (ref >60)
GFR calc non Af Amer: 42 mL/min — ABNORMAL LOW (ref >60)
GLUCOSE: 131 mg/dL — AB (ref 65–99)
POTASSIUM: 4.5 mmol/L (ref 3.5–5.1)
Sodium: 138 mmol/L (ref 135–145)

## 2017-02-22 LAB — CBC
HEMATOCRIT: 43.3 % (ref 36.0–46.0)
Hemoglobin: 14.4 g/dL (ref 12.0–15.0)
MCH: 34.5 pg — AB (ref 26.0–34.0)
MCHC: 33.3 g/dL (ref 30.0–36.0)
MCV: 103.8 fL — AB (ref 78.0–100.0)
Platelets: 246 10*3/uL (ref 150–400)
RBC: 4.17 MIL/uL (ref 3.87–5.11)
RDW: 14.2 % (ref 11.5–15.5)
WBC: 10.8 10*3/uL — ABNORMAL HIGH (ref 4.0–10.5)

## 2017-02-22 LAB — URINALYSIS, ROUTINE W REFLEX MICROSCOPIC
BILIRUBIN URINE: NEGATIVE
GLUCOSE, UA: NEGATIVE mg/dL
HGB URINE DIPSTICK: NEGATIVE
KETONES UR: NEGATIVE mg/dL
NITRITE: NEGATIVE
Protein, ur: NEGATIVE mg/dL
Specific Gravity, Urine: 1.012 (ref 1.005–1.030)
pH: 5 (ref 5.0–8.0)

## 2017-02-22 LAB — PROTIME-INR
INR: 2.37
Prothrombin Time: 26.3 seconds — ABNORMAL HIGH (ref 11.4–15.2)

## 2017-02-22 LAB — TROPONIN I

## 2017-02-22 LAB — I-STAT TROPONIN, ED: Troponin i, poc: 0.01 ng/mL (ref 0.00–0.08)

## 2017-02-22 MED ORDER — WARFARIN - PHARMACIST DOSING INPATIENT: Freq: Every day | Status: DC

## 2017-02-22 MED ORDER — SODIUM CHLORIDE 0.9 % IV BOLUS (SEPSIS)
500.0000 mL | Freq: Once | INTRAVENOUS | Status: AC
  Administered 2017-02-22: 500 mL via INTRAVENOUS

## 2017-02-22 MED ORDER — DILTIAZEM HCL-DEXTROSE 100-5 MG/100ML-% IV SOLN (PREMIX)
5.0000 mg/h | Freq: Once | INTRAVENOUS | Status: AC
  Administered 2017-02-22: 5 mg/h via INTRAVENOUS
  Filled 2017-02-22: qty 100

## 2017-02-22 MED ORDER — ONDANSETRON HCL 4 MG/2ML IJ SOLN: 4.0000 mg | Freq: Four times a day (QID) | INTRAMUSCULAR | Status: DC | PRN

## 2017-02-22 MED ORDER — SOTALOL HCL 80 MG PO TABS
80.0000 mg | ORAL_TABLET | Freq: Two times a day (BID) | ORAL | Status: DC
  Administered 2017-02-22 – 2017-02-25 (×6): 80 mg via ORAL
  Filled 2017-02-22 (×6): qty 1

## 2017-02-22 MED ORDER — ONDANSETRON HCL 4 MG PO TABS: 4.0000 mg | ORAL_TABLET | Freq: Four times a day (QID) | ORAL | Status: DC | PRN

## 2017-02-22 MED ORDER — POLYETHYLENE GLYCOL 3350 17 G PO PACK: 17.0000 g | PACK | Freq: Every day | ORAL | Status: DC | PRN

## 2017-02-22 MED ORDER — ACETAMINOPHEN 650 MG RE SUPP: 650.0000 mg | Freq: Four times a day (QID) | RECTAL | Status: DC | PRN

## 2017-02-22 MED ORDER — DORZOLAMIDE HCL 2 % OP SOLN
1.0000 [drp] | Freq: Two times a day (BID) | OPHTHALMIC | Status: DC
  Administered 2017-02-22 – 2017-02-24 (×4): 1 [drp] via OPHTHALMIC
  Filled 2017-02-22: qty 10

## 2017-02-22 MED ORDER — PANTOPRAZOLE SODIUM 40 MG PO TBEC
40.0000 mg | DELAYED_RELEASE_TABLET | Freq: Every day | ORAL | Status: DC
  Administered 2017-02-23 – 2017-02-25 (×3): 40 mg via ORAL
  Filled 2017-02-22 (×3): qty 1

## 2017-02-22 MED ORDER — ACETAMINOPHEN 325 MG PO TABS
650.0000 mg | ORAL_TABLET | Freq: Four times a day (QID) | ORAL | Status: DC | PRN
  Administered 2017-02-23 – 2017-02-24 (×2): 650 mg via ORAL
  Filled 2017-02-22 (×2): qty 2

## 2017-02-22 MED ORDER — SENNA 8.6 MG PO TABS
1.0000 | ORAL_TABLET | Freq: Two times a day (BID) | ORAL | Status: DC
  Administered 2017-02-22 – 2017-02-24 (×5): 8.6 mg via ORAL
  Filled 2017-02-22 (×6): qty 1

## 2017-02-22 MED ORDER — ACETAMINOPHEN 500 MG PO TABS
1000.0000 mg | ORAL_TABLET | Freq: Once | ORAL | Status: AC
Start: 2017-02-22 — End: 2017-02-22
  Administered 2017-02-22: 1000 mg via ORAL
  Filled 2017-02-22: qty 2

## 2017-02-22 MED ORDER — WARFARIN SODIUM 5 MG PO TABS
5.0000 mg | ORAL_TABLET | Freq: Once | ORAL | Status: AC
  Administered 2017-02-22: 5 mg via ORAL
  Filled 2017-02-22: qty 1

## 2017-02-22 MED ORDER — ATORVASTATIN CALCIUM 10 MG PO TABS
10.0000 mg | ORAL_TABLET | Freq: Every evening | ORAL | Status: DC
  Administered 2017-02-23 – 2017-02-24 (×2): 10 mg via ORAL
  Filled 2017-02-22 (×2): qty 1

## 2017-02-22 MED ORDER — SODIUM CHLORIDE 0.9 % IV SOLN
INTRAVENOUS | Status: AC
  Administered 2017-02-22: 22:00:00 via INTRAVENOUS

## 2017-02-22 MED ORDER — DILTIAZEM HCL-DEXTROSE 100-5 MG/100ML-% IV SOLN (PREMIX)
5.0000 mg/h | INTRAVENOUS | Status: DC
  Administered 2017-02-23 – 2017-02-24 (×4): 10 mg/h via INTRAVENOUS
  Filled 2017-02-22 (×3): qty 100

## 2017-02-22 MED ORDER — SODIUM CHLORIDE 0.9% FLUSH
3.0000 mL | Freq: Two times a day (BID) | INTRAVENOUS | Status: DC
  Administered 2017-02-23 – 2017-02-24 (×3): 3 mL via INTRAVENOUS

## 2017-02-22 NOTE — ED Triage Notes (Addendum)
Pt sent from evaluation from PCP related to abnormal pacemaker interrogation; per reading "in and out of a fib since Wednesday." Pt verbalizes decreased energy and severe headache since Wednesday; pt denies chest pain. Pt verbalizes "given shots in back" for chronic back pain on Tuesday.

## 2017-02-22 NOTE — Telephone Encounter (Signed)
I spoke with pt. She reports since getting injections in back this past Tuesday she has noticed heart rate is elevated.  She checked several times on Saturday and Sunday and rate staying 110-120.   Also reports headache since Tuesday.  Some improvement with tylenol.  Feels like pressure.  I told her she should follow up with primary care if headache does not improve.  She is going to send a transmission.  I told her we would contact her once transmission received.

## 2017-02-22 NOTE — ED Provider Notes (Signed)
Emergency Department Provider Note   I have reviewed the triage vital signs and the nursing notes.   HISTORY  Chief Complaint Abnormal ECG   HPI Molly [name] is a 83 y.o. female with PMH of AS, a-fib on coumadin, GERD, HTN, and renal insufficiency presents to the emergency department for evaluation of generalized fatigue and intermittent A. fib for the past several days. The patient reports having a spinal injection approximately one week ago. In preparation for that injection she stopped her Coumadin for 5 days and restarted it last Tuesday (6 days ago). Since that time she's had intermittent headaches and fatigue. She denies any shortness of breath or chest pain. She discussed this with her cardiology clinic who remote interrogated her pacemaker and found that she has been in a-fib with RVR for the last week. Patient has been compliant with her Sotalol. No fever, chills, weakness, numbness, or back pain. Patient describes her headache as throbbing, worse with movement, and only mildly improved with Tylenol. No fever, chills, or neck pain.    Past Medical History:  Diagnosis Date  . Aortic insufficiency    mild by echo 2017  . Cancer ([place])    Skin cancer- basal.  1 mylenoma  . Complication of anesthesia   . DCM (dilated cardiomyopathy) ([place])    EF 40-45% by echo 2017 - nuclear stress test with no ischemia  . GERD (gastroesophageal reflux disease)   . H/O benign essential tremor    on amiodarone resolved off amio  . H/O hyperkalemia    Resolved off ACE inhibitors  . High cholesterol   . History of blood transfusion   . Hyperparathyroidism ([place])   . Hypertension   . Mitral regurgitation    mild to moderate by echo 2017  . Persistent atrial fibrillation ([place])   . PONV (postoperative nausea and vomiting)   . PVC's (premature ventricular contractions)   . Renal insufficiency    MILD  . Shortness of breath    with exertion  . Tachycardia-bradycardia syndrome [facility])    s/p PPM 02/2013    Patient Active Problem List   Diagnosis Date Noted  . DCM (dilated cardiomyopathy) ([place]) 11/10/2016  . Aortic insufficiency   . Mitral regurgitation 11/06/2015  . Encounter for therapeutic drug monitoring 01/29/2015  . Tachycardia-bradycardia syndrome ([place])   . Pacemaker 04/18/2013  . Atrial fibrillation with RVR ([place]) 02/28/2013  . Claw toe, acquired 06/28/2012  . Pain in joint involving ankle and foot 06/28/2012  . PVC (premature ventricular contraction) 06/07/2012  . Fatigue 06/07/2012  . Symptomatic bradycardia 05/23/2012  . HYPERLIPIDEMIA 10/29/2009  . PULMONARY FUNCTION TESTS, ABNORMAL 10/29/2009  . Essential hypertension 10/28/2009  . Persistent atrial fibrillation ([place]) 10/28/2009    Past Surgical History:  Procedure Laterality Date  . AMPUTATION Right 11/27/2014   Procedure: RIGHT 2ND AND 3RD TOE AMPUTATION;  Surgeon: Wylene Simmer, MD;  Location: [facility];  Service: Orthopedics;  Laterality: Right;  . BUNIONECTOMY     Right  . CARDIAC CATHETERIZATION  09/25/2000   EF of 50% -- with normal left ventricular size and function  . CESAREAN SECTION    . CESAREAN SECTION    . EYE SURGERY Bilateral    Cataract  . FOOT SURGERY    . INSERT / REPLACE / REMOVE PACEMAKER  02/2013  . PACEMAKER INSERTION  02/2013  . PERMANENT PACEMAKER INSERTION N/A 03/02/2013   Procedure: PERMANENT PACEMAKER INSERTION;  Surgeon: Evans Lance, MD;  Location[facility] CATH LAB;  Service: Cardiovascular;  Laterality: N/A;  . SHOULDER ARTHROSCOPY W/ ROTATOR CUFF REPAIR    . SHOULDER SURGERY Right    roto cuff  . TOE AMPUTATION Left    2nd and 3rd, bunion under toes.  . TONSILLECTOMY    . VARICOSE VEIN SURGERY        Allergies Tramadol  Family History  Problem Relation Age of Onset  . Hypertension Mother   . Hypertension Father     Social History Social History  Substance Use Topics  . Smoking status: Never Smoker  . Smokeless tobacco: Never Used  . Alcohol use No     Review of Systems  Constitutional: No fever/chills. Positive generalized weakness.  Eyes: No visual changes. ENT: No sore throat. Cardiovascular: Denies chest pain. Respiratory: Denies shortness of breath. Gastrointestinal: No abdominal pain.  No nausea, no vomiting.  No diarrhea.  No constipation. Genitourinary: Negative for dysuria. Musculoskeletal: Positive for continued back pain. Skin: Negative for rash. Neurological: Negative for headaches, focal weakness or numbness.  10-point ROS otherwise negative.  ____________________________________________   PHYSICAL EXAM:  VITAL SIGNS: ED Triage Vitals  Enc Vitals Group     BP 02/22/17 1704 (!) 144/106     Pulse Rate 02/22/17 1704 (!) 140     Resp 02/22/17 1704 19     Temp --      Temp Source 02/22/17 1704 Oral     SpO2 02/22/17 1704 95 %     Weight 02/22/17 1704 187 lb (84.8 kg)     Height 02/22/17 1704 5\' 5"  (1.651 m)     Pain Score 02/22/17 1714 7   Constitutional: Alert and oriented. Well appearing and in no acute distress. Eyes: Conjunctivae are normal.  Head: Atraumatic. Nose: No congestion/rhinnorhea. Mouth/Throat: Mucous membranes are moist.  Oropharynx non-erythematous. Neck: No stridor.  Cardiovascular: A-fib RVR. Good peripheral circulation. Grossly normal heart sounds.   Respiratory: Normal respiratory effort.  No retractions. Lungs CTAB. Gastrointestinal: Soft and nontender. No distention.  Musculoskeletal: No lower extremity tenderness nor edema. No gross deformities of extremities. Neurologic:  Normal speech and language. No gross focal neurologic deficits are appreciated.  Skin:  Skin is warm, dry and intact. No rash noted.  ____________________________________________   LABS (all labs ordered are listed, but only abnormal results are displayed)  Labs Reviewed  BASIC METABOLIC PANEL - Abnormal; Notable for the following:       Result Value   Glucose, Bld 131 (*)    BUN 32 (*)    Creatinine,  Ser 1.18 (*)    Calcium 10.5 (*)    GFR calc non Af Amer 42 (*)    GFR calc Af Amer 49 (*)    All other components within normal limits  CBC - Abnormal; Notable for the following:    WBC 10.8 (*)    MCV 103.8 (*)    MCH 34.5 (*)    All other components within normal limits  PROTIME-INR - Abnormal; Notable for the following:    Prothrombin Time 26.3 (*)    All other components within normal limits  URINALYSIS, ROUTINE W REFLEX MICROSCOPIC - Abnormal; Notable for the following:    Leukocytes, UA LARGE (*)    Bacteria, UA RARE (*)    Squamous Epithelial / LPF 0-5 (*)    All other components within normal limits  PROTIME-INR - Abnormal; Notable for the following:    Prothrombin Time 26.7 (*)    All other components within normal limits  COMPREHENSIVE METABOLIC PANEL -  Abnormal; Notable for the following:    Glucose, Bld 108 (*)    BUN 27 (*)    Total Protein 6.2 (*)    ALT 13 (*)    GFR calc non Af Amer 52 (*)    All other components within normal limits  CBC - Abnormal; Notable for the following:    MCV 105.4 (*)    All other components within normal limits  MRSA PCR SCREENING  MAGNESIUM  PHOSPHORUS  TSH  TROPONIN I  TROPONIN I  TROPONIN I  I-STAT TROPOININ, ED   ____________________________________________  EKG   EKG Interpretation  Date/Time:  Monday February 22 2017 17:15:56 EDT Ventricular Rate:  138 PR Interval:    QRS Duration: 132 QT Interval:  304 QTC Calculation: 461 R Axis:   -49 Text Interpretation:  Wide-QRS tachycardia RBBB and LAFB No STEMI.  Confirmed by LONG MD, JOSHUA [phone]) on 02/22/2017 5:52:06 PM Also confirmed by LONG MD, JOSHUA [id number]), editor Drema Pry [phone])  on 02/23/2017 7:20:35 AM       ____________________________________________  RADIOLOGY  Dg Chest 2 View  Result Date: 02/22/2017 CLINICAL DATA:  Abnormal EKG, atrial fibrillation EXAM: CHEST  2 VIEW COMPARISON:  06/08/2014 FINDINGS: Left-sided duo lead pacing device,  similar compared to prior. Slightly elevated right diaphragm as before. No acute infiltrate or effusion. Stable cardiomediastinal silhouette with prominent mitral calcification. No pneumothorax. Scoliosis and degenerative changes of the spine. IMPRESSION: No active cardiopulmonary disease. Electronically Signed   By: Donavan Foil M.D.   On: 02/22/2017 17:57   Ct Head Wo Contrast  Result Date: 02/22/2017 CLINICAL DATA:  Severe headache. EXAM: CT HEAD WITHOUT CONTRAST TECHNIQUE: Contiguous axial images were obtained from the base of the skull through the vertex without intravenous contrast. COMPARISON:  08/15/2015 FINDINGS: Brain: There is no evidence for acute hemorrhage, hydrocephalus, mass lesion, or abnormal extra-axial fluid collection. No definite CT evidence for acute infarction. Diffuse loss of parenchymal volume is consistent with atrophy. Patchy low attenuation in the deep hemispheric and periventricular white matter is nonspecific, but likely reflects chronic microvascular ischemic demyelination. Vascular: No hyperdense vessel or unexpected calcification. Skull: No evidence for fracture. No worrisome lytic or sclerotic lesion. Sinuses/Orbits: The visualized paranasal sinuses and mastoid air cells are clear. Visualized portions of the globes and intraorbital fat are unremarkable. Other: None. IMPRESSION: 1. Stable.  No acute intracranial abnormality. 2. Atrophy with chronic small vessel white matter disease. Electronically Signed   By: Misty Stanley M.D.   On: 02/22/2017 17:53    ____________________________________________   PROCEDURES  Procedure(s) performed:   Procedures  CRITICAL CARE Performed by: Margette Fast Total critical care time: 40 minutes Critical care time was exclusive of separately billable procedures and treating other patients. Critical care was necessary to treat or prevent imminent or life-threatening deterioration. Critical care was time spent personally by me on  the following activities: development of treatment plan with patient and/or surrogate as well as nursing, discussions with consultants, evaluation of patient's response to treatment, examination of patient, obtaining history from patient or surrogate, ordering and performing treatments and interventions, ordering and review of laboratory studies, ordering and review of radiographic studies, pulse oximetry and re-evaluation of patient's condition.  Nanda Quinton, MD Emergency Medicine  ____________________________________________   INITIAL IMPRESSION / ASSESSMENT AND PLAN / ED COURSE  Pertinent labs & imaging results that were available during my care of the patient were reviewed by me and considered in my medical decision making (see chart for details).  Patient resents to the emergency department for evaluation of generalized weakness over the past week. She has A. fib with RVR reported on her pacemaker for the past several days. She held her Coumadin recently for a spine injection. Current heart rate is in the 130-150s. Normal blood pressure. Patient is awake and alert with no acute distress. Plan for CT head with severe HA but symptoms not totally consistent with bleed. No falls recently.   07:35 PM Spoke with Dr. Wynonia Lawman with Cardiology. Advises home Sotalol and then Diltiazem if that doesn't slow the rate. She is not a cardioversion candidate with recent time off of warfarin. Patient asymptomatic in bed but became very lightheaded with ambulation to the bathroom with worsening tachycardia.   Diltiazem infusion started with incomplete rate control with Sotalol. Plan for admission for further rate control.   Discussed patient's case with hospitalist.  Patient and family (if present) updated with plan. Care transferred to hospitalist service.  I reviewed all nursing notes, vitals, pertinent old records, EKGs, labs, imaging (as available).  ____________________________________________  FINAL  CLINICAL IMPRESSION(S) / ED DIAGNOSES  Final diagnoses:  Atrial fibrillation with RVR ([place])     MEDICATIONS GIVEN DURING THIS VISIT:  Medications  sotalol (BETAPACE) tablet 80 mg (80 mg Oral Given 02/22/17 1944)  dorzolamide (TRUSOPT) 2 % ophthalmic solution 1 drop (1 drop Right Eye Given 02/22/17 2302)  atorvastatin (LIPITOR) tablet 10 mg (not administered)  pantoprazole (PROTONIX) EC tablet 40 mg (not administered)  sodium chloride flush (NS) 0.9 % injection 3 mL (3 mLs Intravenous Not Given 02/22/17 2230)  acetaminophen (TYLENOL) tablet 650 mg (not administered)    Or  acetaminophen (TYLENOL) suppository 650 mg (not administered)  ondansetron (ZOFRAN) tablet 4 mg (not administered)    Or  ondansetron (ZOFRAN) injection 4 mg (not administered)  diltiazem (CARDIZEM) 100 mg in dextrose 5% 157mL (1 mg/mL) infusion (5 mg/hr Intravenous Rate/Dose Verify 02/23/17 0800)  0.9 %  sodium chloride infusion ( Intravenous Rate/Dose Verify 02/23/17 0800)  polyethylene glycol (MIRALAX / GLYCOLAX) packet 17 g (not administered)  senna (SENOKOT) tablet 8.6 mg (8.6 mg Oral Given 02/22/17 2243)  Warfarin - Pharmacist Dosing Inpatient (not administered)  sodium chloride 0.9 % bolus 500 mL (0 mLs Intravenous Stopped 02/22/17 2133)  acetaminophen (TYLENOL) tablet 1,000 mg (1,000 mg Oral Given 02/22/17 1944)  diltiazem (CARDIZEM) 100 mg in dextrose 5% 174mL (1 mg/mL) infusion (5 mg/hr Intravenous New Bag/Given 02/22/17 2132)  warfarin (COUMADIN) tablet 5 mg (5 mg Oral Given 02/22/17 2243)     NEW OUTPATIENT MEDICATIONS STARTED DURING THIS VISIT:  None   Note:  This document was prepared using Dragon voice recognition software and may include unintentional dictation errors.  Nanda Quinton, MD Emergency Medicine   Margette Fast, MD 02/23/17 [phone]

## 2017-02-22 NOTE — Telephone Encounter (Signed)
Continue medical therapy with sotalol.

## 2017-02-22 NOTE — ED Notes (Signed)
Called pharm,acy to request betapace, Abigail Butts stated she would send medication

## 2017-02-22 NOTE — H&P (Signed)
Molly [name] IWL:[id number] DOB: 1933/07/10 DOA: 02/22/2017     PCP: Gara Kroner, MD   Outpatient Specialists: Cardiology Frans[facility] Him Patient coming from:    home Lives  With family (husband)    Chief Complaint: fatigue  HPI: Molly [name] is a 83 y.o. female with medical history significant of paroxysmal atrial fibrillation on chronic systemic anticoagulation, HTN, symptomatic PVC's, low normal LVF EF 50-55%, tachybrady syndrome s/p PPM, GERD, Mitral regurgitation, CKD    Presented with fatigue and palpitation for the past 5 days. On Tuesday 6 days ago patient received spinal injections. Prior to this she had to hold her Coumadin for 5 days. She checked her heart rate at home and was bitten 110-120 she's been having intermittent headaches but improved if Tylenol. Her pacemaker was interrogated found that she has been in persistent A. fib with RVR for past few days the heart rate going up to 150s patient has been taking her sotalol and has restarted her Coumadin after the injections on Tuesday evening. She was sent to emergency department by her PCP given abnormal pacemaker interrogation results. She denies having any chest pain or shortness of breath but just overall feeling very weak and tired. No fevers or chills no dysuria no incontinence. No lower extremity weakness    Regarding pertinent Chronic problems: Patient has known history of atrial fibrillation followed by cardiology last cardiac catheterization done showed EF of 50% in 2001 Been on multiple medications in attempt to control her arrhythmias on amiodarone in the past have caused tremors and was stopped she was started on sotalol she had fatigue with calcium channel blockers. She's been taking Coumadin for anticoagulation  IN ER:  No data recorded.      RR 20 96% HR 134 BP 129/100 Trop 0.01 Na 138 K 4.5 BUN 32 Cr 1.18 in 2016 it was 1.09 WBC 10.8 Hg 14.4 INR 2.37 CXR negative CT head: stable Following  Medications were ordered in ER: Medications  sotalol (BETAPACE) tablet 80 mg (80 mg Oral Given 02/22/17 1944)  diltiazem (CARDIZEM) 100 mg in dextrose 5% 162mL (1 mg/mL) infusion (not administered)  sodium chloride 0.9 % bolus 500 mL (500 mLs Intravenous New Bag/Given 02/22/17 1911)  acetaminophen (TYLENOL) tablet 1,000 mg (1,000 mg Oral Given 02/22/17 1944)     ER provider discussed case with:  Dr.Tilley who advised against cardioversion given that recently coumadin was on hold.   Hospitalist was called for admission for a.fib w RVR  Review of Systems:    Pertinent positives include:palpitations , fatigue,  Constitutional:  No weight loss, night sweats, Fevers, chills weight loss  HEENT:  No headaches, Difficulty swallowing,Tooth/dental problems,Sore throat,  No sneezing, itching, ear ache, nasal congestion, post nasal drip,  Cardio-vascular:  No chest pain, Orthopnea, PND, anasarca, dizziness, .no Bilateral lower extremity swelling  GI:  No heartburn, indigestion, abdominal pain, nausea, vomiting, diarrhea, change in bowel habits, loss of appetite, melena, blood in stool, hematemesis Resp:  no shortness of breath at rest. No dyspnea on exertion, No excess mucus, no productive cough, No non-productive cough, No coughing up of blood.No change in color of mucus.No wheezing. Skin:  no rash or lesions. No jaundice GU:  no dysuria, change in color of urine, no urgency or frequency. No straining to urinate.  No flank pain.  Musculoskeletal:  No joint pain or no joint swelling. No decreased range of motion. No back pain.  Psych:  No change in mood or affect. No depression or  anxiety. No memory loss.  Neuro: no localizing neurological complaints, no tingling, no weakness, no double vision, no gait abnormality, no slurred speech, no confusion  As per HPI otherwise 10 point review of systems negative.   Past Medical History: Past Medical History:  Diagnosis Date  . Aortic  insufficiency    mild by echo 2017  . Cancer ([place])    Skin cancer- basal.  1 mylenoma  . Complication of anesthesia   . DCM (dilated cardiomyopathy) ([place])    EF 40-45% by echo 2017 - nuclear stress test with no ischemia  . GERD (gastroesophageal reflux disease)   . H/O benign essential tremor    on amiodarone resolved off amio  . H/O hyperkalemia    Resolved off ACE inhibitors  . High cholesterol   . History of blood transfusion   . Hyperparathyroidism ([place])   . Hypertension   . Mitral regurgitation    mild to moderate by echo 2017  . Persistent atrial fibrillation ([place])   . PONV (postoperative nausea and vomiting)   . PVC's (premature ventricular contractions)   . Renal insufficiency    MILD  . Shortness of breath    with exertion  . Tachycardia-bradycardia syndrome [facility])    s/p PPM 02/2013   Past Surgical History:  Procedure Laterality Date  . AMPUTATION Right 11/27/2014   Procedure: RIGHT 2ND AND 3RD TOE AMPUTATION;  Surgeon: Wylene Simmer, MD;  Location: [facility];  Service: Orthopedics;  Laterality: Right;  . BUNIONECTOMY     Right  . CARDIAC CATHETERIZATION  09/25/2000   EF of 50% -- with normal left ventricular size and function  . CESAREAN SECTION    . CESAREAN SECTION    . EYE SURGERY Bilateral    Cataract  . FOOT SURGERY    . INSERT / REPLACE / REMOVE PACEMAKER  02/2013  . PACEMAKER INSERTION  02/2013  . PERMANENT PACEMAKER INSERTION N/A 03/02/2013   Procedure: PERMANENT PACEMAKER INSERTION;  Surgeon: Evans Lance, MD;  Location: [facility] CATH LAB;  Service: Cardiovascular;  Laterality: N/A;  . SHOULDER ARTHROSCOPY W/ ROTATOR CUFF REPAIR    . SHOULDER SURGERY Right    roto cuff  . TOE AMPUTATION Left    2nd and 3rd, bunion under toes.  . TONSILLECTOMY    . VARICOSE VEIN SURGERY       Social History:  Ambulatory   Cane and assistance    reports that she has never smoked. She has never used smokeless tobacco. She reports that she does not drink alcohol or use  drugs.  Allergies:   Allergies  Allergen Reactions  . Tramadol Nausea Only       Family History:   Family History  Problem Relation Age of Onset  . Hypertension Mother   . Hypertension Father     Medications: Prior to Admission medications   Medication Sig Start Date End Date Taking? Authorizing Provider  acetaminophen (TYLENOL) 500 MG tablet Take 1,000 mg by mouth every 6 (six) hours as needed. For pain   Yes Historical Provider, MD  atorvastatin (LIPITOR) 10 MG tablet Take 10 mg by mouth every evening.   Yes Historical Provider, MD  dorzolamide (TRUSOPT) 2 % ophthalmic solution Place 1 drop into the right eye 2 (two) times daily. 06/22/12  Yes Historical Provider, MD  furosemide (LASIX) 20 MG tablet Take 20 mg by mouth daily.   Yes Historical Provider, MD  latanoprost (XALATAN) 0.005 % ophthalmic solution Place 1 drop into the right eye  at bedtime. 06/22/12  Yes Historical Provider, MD  Multiple Vitamin (MULTIVITAMIN WITH MINERALS) TABS Take 1 tablet by mouth daily.   Yes Historical Provider, MD  omeprazole (PRILOSEC) 20 MG capsule Take 20 mg by mouth daily.   Yes Historical Provider, MD  sotalol (BETAPACE) 80 MG tablet TAKE 1 & 1/2 TABLETS BY MOUTH ONCE DAILY AT BEDTIME Patient taking differently: take 1/2 tablet by mouth in the morning and then take 1 tablet by mouth at bedtime 12/07/16  Yes Evans Lance, MD  warfarin (COUMADIN) 5 MG tablet TAKE 1/2-1 TABLET DAILY AS DIRECTED BY ANTICOAGULATION CLINIC 10/27/16  Yes Sueanne Margarita, MD    Physical Exam: Patient Vitals for the past 24 hrs:  BP Temp src Pulse Resp SpO2 Height Weight  02/22/17 1845 (!) 129/100 - (!) 36 20 96 % - -  02/22/17 1830 (!) 113/96 - 98 19 97 % - -  02/22/17 1730 (!) 137/98 - (!) 127 (!) 23 97 % - -  02/22/17 1716 (!) 155/107 - (!) 145 (!) 21 97 % - -  02/22/17 1704 (!) 144/106 Oral (!) 140 19 95 % 5\' 5"  (1.651 m) 84.8 kg (187 lb)    1. General:  in No Acute distress 2. Psychological: Alert and  Oriented 3. Head/ENT:    Dry Mucous Membranes                          Head Non traumatic, neck supple                            Poor Dentition 4. SKIN:  decreased Skin turgor,  Skin clean Dry and intact no rash 5. Heart: rapid irregular rate and rhythm Murmur, Rub or gallop 6. Lungs:  Clear to auscultation bilaterally, no wheezes or crackles   7. Abdomen: Soft,  non-tender, Non distended 8. Lower extremities: no clubbing, cyanosis, or edema 9. Neurologically Grossly intact, moving all 4 extremities equally   10. MSK: Normal range of motion   body mass index is 31.12 kg/m.  Labs on Admission:   Labs on Admission: I have personally reviewed following labs and imaging studies  CBC:  Recent Labs Lab 02/22/17 1724  WBC 10.8*  HGB 14.4  HCT 43.3  MCV 103.8*  PLT 182   Basic Metabolic Panel:  Recent Labs Lab 02/22/17 1724  NA 138  K 4.5  CL 107  CO2 22  GLUCOSE 131*  BUN 32*  CREATININE 1.18*  CALCIUM 10.5*   GFR: Estimated Creatinine Clearance: 40.2 mL/min (A) (by C-G formula based on SCr of 1.18 mg/dL (H)). Liver Function Tests: No results for input(s): AST, ALT, ALKPHOS, BILITOT, PROT, ALBUMIN in the last 168 hours. No results for input(s): LIPASE, AMYLASE in the last 168 hours. No results for input(s): AMMONIA in the last 168 hours. Coagulation Profile:  Recent Labs Lab 02/22/17 1724  INR 2.37   Cardiac Enzymes: No results for input(s): CKTOTAL, CKMB, CKMBINDEX, TROPONINI in the last 168 hours. BNP (last 3 results) No results for input(s): PROBNP in the last 8760 hours. HbA1C: No results for input(s): HGBA1C in the last 72 hours. CBG: No results for input(s): GLUCAP in the last 168 hours. Lipid Profile: No results for input(s): CHOL, HDL, LDLCALC, TRIG, CHOLHDL, LDLDIRECT in the last 72 hours. Thyroid Function Tests: No results for input(s): TSH, T4TOTAL, FREET4, T3FREE, THYROIDAB in the last 72 hours. Anemia Panel: No  results for input(s):  VITAMINB12, FOLATE, FERRITIN, TIBC, IRON, RETICCTPCT in the last 72 hours.  Sepsis Labs: @LABRCNTIP (procalcitonin:4,lacticidven:4) )No results found for this or any previous visit (from the past 240 hour(s)).    UA  ordered  Lab Results  Component Value Date   HGBA1C 5.7 (H) 05/23/2012    Estimated Creatinine Clearance: 40.2 mL/min (A) (by C-G formula based on SCr of 1.18 mg/dL (H)).  BNP (last 3 results) No results for input(s): PROBNP in the last 8760 hours.   ECG REPORT  Independently reviewed Rate:138  Rhythm: a.fib w rvr wide QRS, RBBB ST&T Change: No acute ischemic changes   QTC 461  Filed Weights   02/22/17 1704  Weight: 84.8 kg (187 lb)     Cultures:    Component Value Date/Time   SDES URINE, CLEAN CATCH 06/08/2014 1608   SPECREQUEST NONE 06/08/2014 1608   CULT  06/08/2014 1608    ESCHERICHIA COLI Performed at [facility] 06/12/2014 FINAL 06/08/2014 1608     Radiological Exams on Admission: Dg Chest 2 View  Result Date: 02/22/2017 CLINICAL DATA:  Abnormal EKG, atrial fibrillation EXAM: CHEST  2 VIEW COMPARISON:  06/08/2014 FINDINGS: Left-sided duo lead pacing device, similar compared to prior. Slightly elevated right diaphragm as before. No acute infiltrate or effusion. Stable cardiomediastinal silhouette with prominent mitral calcification. No pneumothorax. Scoliosis and degenerative changes of the spine. IMPRESSION: No active cardiopulmonary disease. Electronically Signed   By: Donavan Foil M.D.   On: 02/22/2017 17:57   Ct Head Wo Contrast  Result Date: 02/22/2017 CLINICAL DATA:  Severe headache. EXAM: CT HEAD WITHOUT CONTRAST TECHNIQUE: Contiguous axial images were obtained from the base of the skull through the vertex without intravenous contrast. COMPARISON:  08/15/2015 FINDINGS: Brain: There is no evidence for acute hemorrhage, hydrocephalus, mass lesion, or abnormal extra-axial fluid collection. No definite CT evidence for acute  infarction. Diffuse loss of parenchymal volume is consistent with atrophy. Patchy low attenuation in the deep hemispheric and periventricular white matter is nonspecific, but likely reflects chronic microvascular ischemic demyelination. Vascular: No hyperdense vessel or unexpected calcification. Skull: No evidence for fracture. No worrisome lytic or sclerotic lesion. Sinuses/Orbits: The visualized paranasal sinuses and mastoid air cells are clear. Visualized portions of the globes and intraorbital fat are unremarkable. Other: None. IMPRESSION: 1. Stable.  No acute intracranial abnormality. 2. Atrophy with chronic small vessel white matter disease. Electronically Signed   By: Misty Stanley M.D.   On: 02/22/2017 17:53    Chart has been reviewed    Assessment/Plan   83 y.o. female with medical history significant of paroxysmal atrial fibrillation on chronic systemic anticoagulation, HTN, symptomatic PVC's, low normal LVF EF 50-55%, tachybrady syndrome s/p PPM, GERD, Mitral regurgitation, CKD admitted for A.fib w RVR  Present on Admission: . DCM (dilated cardiomyopathy) ([place]) Given slight increased in Cr and reporting lightheadedness when she stands up suspect possibly slightly on the dry side for tonight hold Lasix give very gentle fluids and full . Essential hypertension stable continue to monitor resume home medications when able   . Atrial fibrillation with RVR (HCC)  - Admit to step down on Cardizem drip       CHA2D-VASC score 5     contiue  Coumadin per pharmacy          continue sotalol      Check TSH      Cycle cardiac enzymes      Obtain ECHO      Cardiology  consult in AM     Other plan as per orders.  DVT prophylaxis:  coumadin   Code Status:  FULL CODE  as per patient   Family Communication:   Family   at  Bedside  plan of care was discussed with  Husband  Disposition Plan:     To home once workup is complete and patient is stable                       Would benefit from  PT/OT eval prior to DC  ordered                                                Consults called: email cardiology     Admission status:   obs   Level of care         SDU      I have spent a total of 57 min on this admission  Daisean Brodhead 02/22/2017, 9:57 PM    [facility]  Pager [phone]   after 2 AM please page floor coverage PA If 7AM-7PM, please contact the day team taking care of the patient  [email or web address]  Password [email or web address]

## 2017-02-22 NOTE — ED Notes (Signed)
Pt ambulated with assistto bathroom.  Pt c/o dizziness on arrival back to bed.  HR increased to 150's.  Notified MD.

## 2017-02-22 NOTE — ED Notes (Signed)
Pt alert and oriented x 4 pt husband is at bedside. Pt is c/o of a headache 7/10 made Dr. Laverta Baltimore aware.

## 2017-02-22 NOTE — ED Notes (Signed)
Pt transported to DG.  

## 2017-02-22 NOTE — Progress Notes (Signed)
ANTICOAGULATION CONSULT NOTE - Initial Consult  Pharmacy Consult for Warfarin Indication: atrial fibrillation  Allergies  Allergen Reactions  . Tramadol Nausea Only    Patient Measurements: Height: 5\' 5"  (165.1 cm) Weight: 187 lb (84.8 kg) IBW/kg (Calculated) : 57  Vital Signs: Temp Source: Oral (04/30 1704) BP: 129/64 (04/30 2135) Pulse Rate: 118 (04/30 2153)  Labs:  Recent Labs  02/22/17 1724  HGB 14.4  HCT 43.3  PLT 246  LABPROT 26.3*  INR 2.Molly  CREATININE 1.18*    Estimated Creatinine Clearance: 40.2 mL/min (A) (by C-G formula based on SCr of 1.18 mg/dL (H)).   Medical History: Past Medical History:  Diagnosis Date  . Aortic insufficiency    mild by echo 2017  . Cancer ([place])    Skin cancer- basal.  1 mylenoma  . Complication of anesthesia   . DCM (dilated cardiomyopathy) ([place])    EF 40-45% by echo 2017 - nuclear stress test with no ischemia  . GERD (gastroesophageal reflux disease)   . H/O benign essential tremor    on amiodarone resolved off amio  . H/O hyperkalemia    Resolved off ACE inhibitors  . High cholesterol   . History of blood transfusion   . Hyperparathyroidism ([place])   . Hypertension   . Mitral regurgitation    mild to moderate by echo 2017  . Persistent atrial fibrillation ([place])   . PONV (postoperative nausea and vomiting)   . PVC's (premature ventricular contractions)   . Renal insufficiency    MILD  . Shortness of breath    with exertion  . Tachycardia-bradycardia syndrome [facility])    s/p PPM 02/2013    Assessment:  83 yr Molly [name] admitted with AFib.  PMH significant for AFib, HTN, CKD.  On Warfarin PTA  INR = 2.Molly  PTA Warfarin regimen: 5mg  daily except 2.5mg  on Thur& Sun  Last dose reported as taken on 4/29 @ 18:00  Goal of Therapy:  INR 2-3    Plan:   Warfarin 5mg  po x 1 tonight  Check daily INR  Milli Molly Molly [name], PharmD 02/22/2017,10:12 PM

## 2017-02-22 NOTE — Telephone Encounter (Signed)
Transmission reviewed from 0240 12/22/16. AF RVR noted, V rates 110-150bpm noted. She reports taking her sotalol as prescribed and re-started her coumadin Tuesday evening. She will send another transmission today to review any episodes from Friday and Saturday when she felt poorly. I have advised her to contact the physician who gave her these injections regarding headache and general feeling of unwell. I will contact her if any other arrhythmias are noted- if more of the same AF I will not call back. Information routed to Dr. [name] if he has any other recommendations.

## 2017-02-22 NOTE — Telephone Encounter (Signed)
New Message    Per pt Heart rate has been elevated around 110-120. Feeling bad, and having headaches. Has been experiencing since Tuesday after getting shots in back. Requesting call back from nurse.

## 2017-02-23 ENCOUNTER — Observation Stay (HOSPITAL_BASED_OUTPATIENT_CLINIC_OR_DEPARTMENT_OTHER): Payer: Medicare Other

## 2017-02-23 DIAGNOSIS — Z950 Presence of cardiac pacemaker: Secondary | ICD-10-CM | POA: Diagnosis not present

## 2017-02-23 DIAGNOSIS — Z85828 Personal history of other malignant neoplasm of skin: Secondary | ICD-10-CM | POA: Diagnosis not present

## 2017-02-23 DIAGNOSIS — I4891 Unspecified atrial fibrillation: Secondary | ICD-10-CM

## 2017-02-23 DIAGNOSIS — I130 Hypertensive heart and chronic kidney disease with heart failure and stage 1 through stage 4 chronic kidney disease, or unspecified chronic kidney disease: Secondary | ICD-10-CM | POA: Diagnosis present

## 2017-02-23 DIAGNOSIS — I083 Combined rheumatic disorders of mitral, aortic and tricuspid valves: Secondary | ICD-10-CM | POA: Diagnosis present

## 2017-02-23 DIAGNOSIS — I481 Persistent atrial fibrillation: Principal | ICD-10-CM

## 2017-02-23 DIAGNOSIS — I10 Essential (primary) hypertension: Secondary | ICD-10-CM | POA: Diagnosis not present

## 2017-02-23 DIAGNOSIS — I340 Nonrheumatic mitral (valve) insufficiency: Secondary | ICD-10-CM | POA: Diagnosis not present

## 2017-02-23 DIAGNOSIS — I059 Rheumatic mitral valve disease, unspecified: Secondary | ICD-10-CM | POA: Diagnosis not present

## 2017-02-23 DIAGNOSIS — I420 Dilated cardiomyopathy: Secondary | ICD-10-CM

## 2017-02-23 DIAGNOSIS — E6609 Other obesity due to excess calories: Secondary | ICD-10-CM | POA: Diagnosis not present

## 2017-02-23 DIAGNOSIS — I428 Other cardiomyopathies: Secondary | ICD-10-CM | POA: Diagnosis not present

## 2017-02-23 DIAGNOSIS — Z6831 Body mass index (BMI) 31.0-31.9, adult: Secondary | ICD-10-CM | POA: Diagnosis not present

## 2017-02-23 DIAGNOSIS — N183 Chronic kidney disease, stage 3 (moderate): Secondary | ICD-10-CM | POA: Diagnosis present

## 2017-02-23 DIAGNOSIS — I480 Paroxysmal atrial fibrillation: Secondary | ICD-10-CM | POA: Diagnosis present

## 2017-02-23 DIAGNOSIS — E7800 Pure hypercholesterolemia, unspecified: Secondary | ICD-10-CM | POA: Diagnosis present

## 2017-02-23 DIAGNOSIS — E669 Obesity, unspecified: Secondary | ICD-10-CM | POA: Diagnosis present

## 2017-02-23 DIAGNOSIS — E785 Hyperlipidemia, unspecified: Secondary | ICD-10-CM | POA: Diagnosis not present

## 2017-02-23 DIAGNOSIS — R0602 Shortness of breath: Secondary | ICD-10-CM | POA: Diagnosis not present

## 2017-02-23 DIAGNOSIS — Z7901 Long term (current) use of anticoagulants: Secondary | ICD-10-CM | POA: Diagnosis not present

## 2017-02-23 DIAGNOSIS — Z888 Allergy status to other drugs, medicaments and biological substances status: Secondary | ICD-10-CM | POA: Diagnosis not present

## 2017-02-23 DIAGNOSIS — I5022 Chronic systolic (congestive) heart failure: Secondary | ICD-10-CM | POA: Diagnosis not present

## 2017-02-23 DIAGNOSIS — Z8249 Family history of ischemic heart disease and other diseases of the circulatory system: Secondary | ICD-10-CM | POA: Diagnosis not present

## 2017-02-23 DIAGNOSIS — K219 Gastro-esophageal reflux disease without esophagitis: Secondary | ICD-10-CM | POA: Diagnosis present

## 2017-02-23 LAB — CBC
HEMATOCRIT: 41.1 % (ref 36.0–46.0)
Hemoglobin: 12.9 g/dL (ref 12.0–15.0)
MCH: 33.1 pg (ref 26.0–34.0)
MCHC: 31.4 g/dL (ref 30.0–36.0)
MCV: 105.4 fL — AB (ref 78.0–100.0)
PLATELETS: 214 10*3/uL (ref 150–400)
RBC: 3.9 MIL/uL (ref 3.87–5.11)
RDW: 14.1 % (ref 11.5–15.5)
WBC: 9.5 10*3/uL (ref 4.0–10.5)

## 2017-02-23 LAB — PROTIME-INR
INR: 2.41
Prothrombin Time: 26.7 seconds — ABNORMAL HIGH (ref 11.4–15.2)

## 2017-02-23 LAB — COMPREHENSIVE METABOLIC PANEL
ALBUMIN: 3.6 g/dL (ref 3.5–5.0)
ALT: 13 U/L — AB (ref 14–54)
AST: 19 U/L (ref 15–41)
Alkaline Phosphatase: 43 U/L (ref 38–126)
Anion gap: 7 (ref 5–15)
BUN: 27 mg/dL — AB (ref 6–20)
CHLORIDE: 108 mmol/L (ref 101–111)
CO2: 25 mmol/L (ref 22–32)
CREATININE: 0.99 mg/dL (ref 0.44–1.00)
Calcium: 10.2 mg/dL (ref 8.9–10.3)
GFR calc Af Amer: >60 mL/min (ref >60)
GFR calc non Af Amer: 52 mL/min — ABNORMAL LOW (ref >60)
GLUCOSE: 108 mg/dL — AB (ref 65–99)
POTASSIUM: 4.8 mmol/L (ref 3.5–5.1)
Sodium: 140 mmol/L (ref 135–145)
Total Bilirubin: 0.9 mg/dL (ref 0.3–1.2)
Total Protein: 6.2 g/dL — ABNORMAL LOW (ref 6.5–8.1)

## 2017-02-23 LAB — ECHOCARDIOGRAM COMPLETE
Height: 65 in
Weight: 2966.51 oz

## 2017-02-23 LAB — TSH: TSH: 3.115 u[IU]/mL (ref 0.350–4.500)

## 2017-02-23 LAB — TROPONIN I
Troponin I: <0.03 ng/mL (ref <0.03)
Troponin I: <0.03 ng/mL (ref <0.03)

## 2017-02-23 LAB — MAGNESIUM: Magnesium: 1.9 mg/dL (ref 1.7–2.4)

## 2017-02-23 LAB — MRSA PCR SCREENING: MRSA BY PCR: NEGATIVE

## 2017-02-23 LAB — PHOSPHORUS: PHOSPHORUS: 3.2 mg/dL (ref 2.5–4.6)

## 2017-02-23 MED ORDER — SODIUM CHLORIDE 0.9% FLUSH
3.0000 mL | Freq: Two times a day (BID) | INTRAVENOUS | Status: DC
  Administered 2017-02-23 – 2017-02-25 (×4): 3 mL via INTRAVENOUS

## 2017-02-23 MED ORDER — WARFARIN SODIUM 5 MG PO TABS
5.0000 mg | ORAL_TABLET | Freq: Once | ORAL | Status: AC
  Administered 2017-02-23: 5 mg via ORAL
  Filled 2017-02-23: qty 1

## 2017-02-23 MED ORDER — LORAZEPAM 0.5 MG PO TABS
0.5000 mg | ORAL_TABLET | ORAL | Status: DC | PRN
  Administered 2017-02-23 – 2017-02-24 (×2): 0.5 mg via ORAL
  Filled 2017-02-23 (×2): qty 1

## 2017-02-23 MED ORDER — SODIUM CHLORIDE 0.9 % IV SOLN
250.0000 mL | INTRAVENOUS | Status: DC
  Administered 2017-02-23: 250 mL via INTRAVENOUS

## 2017-02-23 MED ORDER — KETOROLAC TROMETHAMINE 15 MG/ML IJ SOLN
15.0000 mg | Freq: Four times a day (QID) | INTRAMUSCULAR | Status: DC | PRN
  Administered 2017-02-23: 15 mg via INTRAVENOUS
  Filled 2017-02-23: qty 1

## 2017-02-23 MED ORDER — SODIUM CHLORIDE 0.9% FLUSH: 3.0000 mL | INTRAVENOUS | Status: DC | PRN

## 2017-02-23 NOTE — Telephone Encounter (Signed)
LMTCB//sss 

## 2017-02-23 NOTE — Progress Notes (Signed)
  Echocardiogram 2D Echocardiogram has been performed.  Molly [name] 02/23/2017, 8:54 AM

## 2017-02-23 NOTE — Consult Note (Signed)
Patient ID: Molly [name] MRN: [id number], DOB/AGE: 1933/10/15   Admit date: 02/22/2017  Reason for Consult: Atrial Fibrillation w/ RVR Requesting Physician: Dr. Roel Cluck, Internal Medicine    Primary Physician: Gara Kroner, MD Primary Cardiologist: Dr. Radford Pax Electrophysiologist: Dr. [name]  Pt. Profile:  Molly [name] is a 83 y.o. female, with a h/o PAF/flutter, on AAD therapy with sotalol and chronic anticoagulation with coumadin, mild systolic HF with EF of 81-82% and mild to moderate MR by echo 11/2016, HTN, PVCs and tachybrady syndrome s/p PPM, who is being seen today for the evaluation of atrial fibrillation w/ RVR, at the request of Dr. Roel Cluck, Internal Medicine. .  Problem List  Past Medical History:  Diagnosis Date  . Aortic insufficiency    mild by echo 2017  . Cancer ([place])    Skin cancer- basal.  1 mylenoma  . Complication of anesthesia   . DCM (dilated cardiomyopathy) ([place])    EF 40-45% by echo 2017 - nuclear stress test with no ischemia  . GERD (gastroesophageal reflux disease)   . H/O benign essential tremor    on amiodarone resolved off amio  . H/O hyperkalemia    Resolved off ACE inhibitors  . High cholesterol   . History of blood transfusion   . Hyperparathyroidism ([place])   . Hypertension   . Mitral regurgitation    mild to moderate by echo 2017  . Persistent atrial fibrillation ([place])   . PONV (postoperative nausea and vomiting)   . PVC's (premature ventricular contractions)   . Renal insufficiency    MILD  . Shortness of breath    with exertion  . Tachycardia-bradycardia syndrome [facility])    s/p PPM 02/2013    Past Surgical History:  Procedure Laterality Date  . AMPUTATION Right 11/27/2014   Procedure: RIGHT 2ND AND 3RD TOE AMPUTATION;  Surgeon: Wylene Simmer, MD;  Location: [facility];  Service: Orthopedics;  Laterality: Right;  . BUNIONECTOMY     Right  . CARDIAC CATHETERIZATION  09/25/2000   EF of 50% -- with normal left ventricular size  and function  . CESAREAN SECTION    . CESAREAN SECTION    . EYE SURGERY Bilateral    Cataract  . FOOT SURGERY    . INSERT / REPLACE / REMOVE PACEMAKER  02/2013  . PACEMAKER INSERTION  02/2013  . PERMANENT PACEMAKER INSERTION N/A 03/02/2013   Procedure: PERMANENT PACEMAKER INSERTION;  Surgeon: Evans Lance, MD;  Location: [facility] C[facility] LAB;  Service: Cardiovascular;  Laterality: N/A;  . SHOULDER ARTHROSCOPY W/ ROTATOR CUFF REPAIR    . SHOULDER SURGERY Right    roto cuff  . TOE AMPUTATION Left    2nd and 3rd, bunion under toes.  . TONSILLECTOMY    . VARICOSE VEIN SURGERY       Allergies  Allergies  Allergen Reactions  . Tramadol Nausea Only    HPI  Molly [name] is a 83 y.o. female, with a h/o PAF/flutter, on AAD therapy with sotalol and chronic anticoagulation with coumadin, mild systolic HF with EF of 99-37% and mild to moderate MR by echo 11/2016, HTN, PVCs and tachybrady syndrome s/p PPM, who is being seen today for the evaluation of atrial fibrillation w/ RVR, at the request of Dr. Roel Cluck, Internal Medicine.  She is followed by Dr. Radford Pax as well as Dr. [name] in [place] [facility].   Pt admitted overnight by IM. She presented to the [facility] ED with complaint of fatigue and palpitations for  the last 5 days. 1 week ago, on 02/16/17, she underwent low back injections and had held her coumadin for 5 days prior to that, but has since restarted. Pt noted elevated pulse rates at home, in the 110s-120s. Given her HR and symptoms, she came to the ED. EKG showed afib w/ RVR with rate in the 130s. Labs notable for WBC of 10.8, but afebrile. BMP with mild renal insufficiency with SCr at 1.18/BUN at 32>>>improved today w/ SCr at 0.99 and BUN at 27. K WNL at 4.8. TSH WNL. Troponin negative x 2. INR therapeutic at 2.41. 2D echo pending. UA with large leukocytes but nitrite negative. She was placed on IV Cardizem and continued on sotalol and coumadin.   She remains on low dose IV Cardizem at 5 mg/hr. HR in the  110s-120s. SBP in the 130s. Her husband notes her HR jumps even higher when she gets up to go to the bathroom. She continues to feel tired and dizzy. Still with frequent headaches. No extremity weakness, visual or speech changes.    Home Medications  Prior to Admission medications   Medication Sig Start Date End Date Taking? Authorizing Provider  acetaminophen (TYLENOL) 500 MG tablet Take 1,000 mg by mouth every 6 (six) hours as needed. For pain   Yes Historical Provider, MD  atorvastatin (LIPITOR) 10 MG tablet Take 10 mg by mouth every evening.   Yes Historical Provider, MD  dorzolamide (TRUSOPT) 2 % ophthalmic solution Place 1 drop into the right eye 2 (two) times daily. 06/22/12  Yes Historical Provider, MD  furosemide (LASIX) 20 MG tablet Take 20 mg by mouth daily.   Yes Historical Provider, MD  Multiple Vitamin (MULTIVITAMIN WITH MINERALS) TABS Take 1 tablet by mouth daily.   Yes Historical Provider, MD  omeprazole (PRILOSEC) 20 MG capsule Take 20 mg by mouth daily.   Yes Historical Provider, MD  sotalol (BETAPACE) 80 MG tablet TAKE 1 & 1/2 TABLETS BY MOUTH ONCE DAILY AT BEDTIME Patient taking differently: take 1/2 tablet by mouth in the morning and then take 1 tablet by mouth at bedtime 12/07/16  Yes Evans Lance, MD  warfarin (COUMADIN) 5 MG tablet TAKE 1/2-1 TABLET DAILY AS [place] Patient taking differently: TAKE 1/2 on Thursday and Sunday -1 TABLET all other days AS DIRECTED BY ANTICOAGULATION CLINIC 10/27/16  Yes Sueanne Margarita, MD    Hospital Medications  . atorvastatin  10 mg Oral QPM  . dorzolamide  1 drop Right Eye BID  . pantoprazole  40 mg Oral Daily  . senna  1 tablet Oral BID  . sodium chloride flush  3 mL Intravenous Q12H  . sotalol  80 mg Oral Q12H  . Warfarin - Pharmacist Dosing Inpatient   Does not apply q1800   . sodium chloride 75 mL/hr at 02/23/17 0400  . diltiazem (CARDIZEM) infusion 5 mg/hr (02/23/17 0400)   acetaminophen **OR**  acetaminophen, ondansetron **OR** ondansetron (ZOFRAN) IV, polyethylene glycol  Family History  Family History  Problem Relation Age of Onset  . Hypertension Mother   . Hypertension Father     Social History  Social History   Social History  . Marital status: Married    Spouse name: N/A  . Number of children: N/A  . Years of education: N/A   Occupational History  . Not on file.   Social History Main Topics  . Smoking status: Never Smoker  . Smokeless tobacco: Never Used  . Alcohol use No  .  Drug use: No  . Sexual activity: Not on file   Other Topics Concern  . Not on file   Social History Narrative  . No narrative on file     Review of Systems General:  No chills, fever, night sweats or weight changes.  Cardiovascular:  No chest pain, dyspnea on exertion, edema, orthopnea, palpitations, paroxysmal nocturnal dyspnea. Dermatological: No rash, lesions/masses Respiratory: No cough, dyspnea Urologic: No hematuria, dysuria Abdominal:   No nausea, vomiting, diarrhea, bright red blood per rectum, melena, or hematemesis Neurologic:  No visual changes, wkns, changes in mental status. All other systems reviewed and are otherwise negative except as noted above.  Physical Exam  Blood pressure (!) 135/113, pulse (!) 101, temperature 97.5 F (36.4 C), temperature source Oral, resp. rate 17, height 5\' 5"  (1.651 m), weight 185 lb 6.5 oz (84.1 kg), SpO2 94 %.  General: Pleasant, NAD Psych: Normal affect. Neuro: Alert and oriented X 3. Moves all extremities spontaneously. HEENT: Normal  Neck: Supple without bruits or JVD. Lungs:  Resp regular and unlabored, CTA. Heart: irregularly, irregular, mildly tachy rate no s3, s4, or murmurs. Abdomen: Soft, non-tender, non-distended, BS + x 4.  Extremities: No clubbing, cyanosis or edema. DP/PT/Radials 2+ and equal bilaterally.  Labs  Troponin [facility] Test)  Recent Labs  02/22/17 1732  TROPIPOC 0.01    Recent Labs   02/22/17 2247 02/23/17 0454  TROPONINI <0.03 <0.03   Lab Results  Component Value Date   WBC 9.5 02/23/2017   HGB 12.9 02/23/2017   HCT 41.1 02/23/2017   MCV 105.4 (H) 02/23/2017   PLT 214 02/23/2017    Recent Labs Lab 02/23/17 0454  NA 140  K 4.8  CL 108  CO2 25  BUN 27*  CREATININE 0.99  CALCIUM 10.2  PROT 6.2*  BILITOT 0.9  ALKPHOS 43  ALT 13*  AST 19  GLUCOSE 108*   Lab Results  Component Value Date   CHOL 156 05/23/2012   HDL 80 05/23/2012   LDLCALC 59 05/23/2012   TRIG 86 05/23/2012   No results found for: DDIMER   Radiology/Studies  Dg Chest 2 View  Result Date: 02/22/2017 CLINICAL DATA:  Abnormal EKG, atrial fibrillation EXAM: CHEST  2 VIEW COMPARISON:  06/08/2014 FINDINGS: Left-sided duo lead pacing device, similar compared to prior. Slightly elevated right diaphragm as before. No acute infiltrate or effusion. Stable cardiomediastinal silhouette with prominent mitral calcification. No pneumothorax. Scoliosis and degenerative changes of the spine. IMPRESSION: No active cardiopulmonary disease. Electronically Signed   By: Donavan Foil M.D.   On: 02/22/2017 17:57   Ct Head Wo Contrast  Result Date: 02/22/2017 CLINICAL DATA:  Severe headache. EXAM: CT HEAD WITHOUT CONTRAST TECHNIQUE: Contiguous axial images were obtained from the base of the skull through the vertex without intravenous contrast. COMPARISON:  08/15/2015 FINDINGS: Brain: There is no evidence for acute hemorrhage, hydrocephalus, mass lesion, or abnormal extra-axial fluid collection. No definite CT evidence for acute infarction. Diffuse loss of parenchymal volume is consistent with atrophy. Patchy low attenuation in the deep hemispheric and periventricular white matter is nonspecific, but likely reflects chronic microvascular ischemic demyelination. Vascular: No hyperdense vessel or unexpected calcification. Skull: No evidence for fracture. No worrisome lytic or sclerotic lesion. Sinuses/Orbits: The  visualized paranasal sinuses and mastoid air cells are clear. Visualized portions of the globes and intraorbital fat are unremarkable. Other: None. IMPRESSION: 1. Stable.  No acute intracranial abnormality. 2. Atrophy with chronic small vessel white matter disease. Electronically Signed  By: Misty Stanley M.D.   On: 02/22/2017 17:53    ECG  Atrial fibrillation w/ RVR 138 bpm-- personally reviewed  Telemetry  Atrial fibrillation w/ RVR -- personally reviewed   Echocardiogram Pending   ASSESSMENT AND PLAN  Active Problems:   Essential hypertension   Persistent atrial fibrillation (HCC)   Atrial fibrillation with RVR (HCC)   Mitral regurgitation   Aortic insufficiency   DCM (dilated cardiomyopathy) ([place])   1. Atrial Fibrillation w/ RVR: K and TSH WNL. Mild renal insufficiency on admit that has since improved (SCr/BUN 1.18/32>> 0.99/27). Troponin negative x 2. 2D echo pending. UA negative for UTI. Ventricular rate remains elevated in the 110s-120s. SBP in the 130s. She is on low dose IV Cardizem at 5 mg/hr. Will increase rate to 10 mg/hr. Continue sotalol and coumadin.   2. Chronic Systolic HF: most recent echo 11/2016 with mildly decreased LVEF at 40-45%. Repeat echo pending. Volume appears stable.   3. Mitral Regurgitation: mild to moderate on echo 11/2016. Repeat echo pending.   4. Tachy-Brady Syndrome: has PPM, followed by Dr. [name].   5. Chronic Systemic Anticoagulation: on coumadin for PAF. INR therapeutic at 2.41. Was temporarily held last week for low back injection.   6. HTN: controlled. Will increase Cardizem for additional rate control. Monitor BP response.    Signed, Lyda Jester, PA-C, MHS 02/23/2017, 7:48 AM [facility] Pager: [phone]

## 2017-02-23 NOTE — Progress Notes (Signed)
PROGRESS NOTE    Molly [name]  BBC:[id number] DOB: 22-Dec-1932 DOA: 02/22/2017 PCP: Gara Kroner, MD    Brief Narrative:  83 y.o. female with medical history significant ofparoxysmal atrial fibrillation on chronic systemic anticoagulation, HTN, symptomatic PVC's, low normal LVF EF 50-55%, tachybrady syndrome s/p PPM, GERD, Mitral regurgitation, CKD    Presented with fatigue and palpitation for the past 5 days. On Tuesday 6 days ago patient received spinal injections. Prior to this she had to hold her Coumadin for 5 days. She checked her heart rate at home and was bitten 110-120 she's been having intermittent headaches but improved if Tylenol. Her pacemaker was interrogated found that she has been in persistent A. fib with RVR for past few days the heart rate going up to 150s patient has been taking her sotalol and has restarted her Coumadin after the injections on Tuesday evening. She was sent to emergency department by her PCP given abnormal pacemaker interrogation results. She denies having any chest pain or shortness of breath but just overall feeling very weak and tired. No fevers or chills no dysuria no incontinence. No lower extremity weakness  Assessment & Plan:   Active Problems:   Essential hypertension   Persistent atrial fibrillation (HCC)   Atrial fibrillation with RVR (HCC)   Mitral regurgitation   Aortic insufficiency   DCM (dilated cardiomyopathy) ([place])   1. Afib RVR 1. Hx of pacemaker placement 2. Required cardizem gtt, currently continued on cardizem 3. Cardiology consulted with recs to continue sotalol, coumadin. Cardizem rate increased to 10mg /hr 2. HTN 1. BP currently stable 2. Cont to monitor 3. Headache 1. Stable at present 2. Head CT reviewed. Neg. No acute bleed 3. Will continue on PRN toradol for now 4. Chronic Anticoagulation 1. Coumadin per pharmacy dosing 2. INR 2.41  DVT prophylaxis: coumadin Code Status: full Family Communication: Pt in  room, family at bedside Disposition Plan: Uncertain at this time  Consultants:   Cardiology  Procedures:     Antimicrobials: Anti-infectives    None       Subjective: No complaints at present  Objective: Vitals:   02/23/17 1000 02/23/17 1100 02/23/17 1159 02/23/17 1200  BP: (!) 144/88     Pulse: 78 71  77  Resp: (!) 22 (!) 25  15  Temp:   97.5 F (36.4 C)   TempSrc:   Oral   SpO2: 94% 96%  96%  Weight:      Height:        Intake/Output Summary (Last 24 hours) at 02/23/17 1348 Last data filed at 02/23/17 1200  Gross per 24 hour  Intake          1309.09 ml  Output              150 ml  Net          1159.09 ml   Filed Weights   02/22/17 1704 02/22/17 2214 02/23/17 0400  Weight: 84.8 kg (187 lb) 84.1 kg (185 lb 6.5 oz) 84.1 kg (185 lb 6.5 oz)    Examination:  General exam: Appears calm and comfortable  Respiratory system: Clear to auscultation. Respiratory effort normal. Cardiovascular system: S1 & S2 heard, RRR Gastrointestinal system: Abdomen is nondistended, soft and nontender. No organomegaly or masses felt. Normal bowel sounds heard. Central nervous system: Alert and oriented. No focal neurological deficits. Extremities: Symmetric 5 x 5 power. Skin: No rashes, lesions Psychiatry: Judgement and insight appear normal. Mood & affect appropriate.   Data Reviewed: I  have personally reviewed following labs and imaging studies  CBC:  Recent Labs Lab 02/22/17 1724 02/23/17 0454  WBC 10.8* 9.5  HGB 14.4 12.9  HCT 43.3 41.1  MCV 103.8* 105.4*  PLT 246 502   Basic Metabolic Panel:  Recent Labs Lab 02/22/17 1724 02/23/17 0454  NA 138 140  K 4.5 4.8  CL 107 108  CO2 22 25  GLUCOSE 131* 108*  BUN 32* 27*  CREATININE 1.18* 0.99  CALCIUM 10.5* 10.2  MG  --  1.9  PHOS  --  3.2   GFR: Estimated Creatinine Clearance: 47.7 mL/min (by C-G formula based on SCr of 0.99 mg/dL). Liver Function Tests:  Recent Labs Lab 02/23/17 0454  AST 19  ALT  13*  ALKPHOS 43  BILITOT 0.9  PROT 6.2*  ALBUMIN 3.6   No results for input(s): LIPASE, AMYLASE in the last 168 hours. No results for input(s): AMMONIA in the last 168 hours. Coagulation Profile:  Recent Labs Lab 02/22/17 1724 02/23/17 0454  INR 2.37 2.41   Cardiac Enzymes:  Recent Labs Lab 02/22/17 2247 02/23/17 0454 02/23/17 1005  TROPONINI <0.03 <0.03 <0.03   BNP (last 3 results) No results for input(s): PROBNP in the last 8760 hours. HbA1C: No results for input(s): HGBA1C in the last 72 hours. CBG: No results for input(s): GLUCAP in the last 168 hours. Lipid Profile: No results for input(s): CHOL, HDL, LDLCALC, TRIG, CHOLHDL, LDLDIRECT in the last 72 hours. Thyroid Function Tests:  Recent Labs  02/23/17 0454  TSH 3.115   Anemia Panel: No results for input(s): VITAMINB12, FOLATE, FERRITIN, TIBC, IRON, RETICCTPCT in the last 72 hours. Sepsis Labs: No results for input(s): PROCALCITON, LATICACIDVEN in the last 168 hours.  Recent Results (from the past 240 hour(s))  MRSA PCR Screening     Status: None   Collection Time: 02/22/17 10:10 PM  Result Value Ref Range Status   MRSA by PCR NEGATIVE NEGATIVE Final    Comment:        The GeneXpert MRSA Assay (FDA approved for NASAL specimens only), is one component of a comprehensive MRSA colonization surveillance program. It is not intended to diagnose MRSA infection nor to guide or monitor treatment for MRSA infections.      Radiology Studies: Dg Chest 2 View  Result Date: 02/22/2017 CLINICAL DATA:  Abnormal EKG, atrial fibrillation EXAM: CHEST  2 VIEW COMPARISON:  06/08/2014 FINDINGS: Left-sided duo lead pacing device, similar compared to prior. Slightly elevated right diaphragm as before. No acute infiltrate or effusion. Stable cardiomediastinal silhouette with prominent mitral calcification. No pneumothorax. Scoliosis and degenerative changes of the spine. IMPRESSION: No active cardiopulmonary disease.  Electronically Signed   By: Donavan Foil M.D.   On: 02/22/2017 17:57   Ct Head Wo Contrast  Result Date: 02/22/2017 CLINICAL DATA:  Severe headache. EXAM: CT HEAD WITHOUT CONTRAST TECHNIQUE: Contiguous axial images were obtained from the base of the skull through the vertex without intravenous contrast. COMPARISON:  08/15/2015 FINDINGS: Brain: There is no evidence for acute hemorrhage, hydrocephalus, mass lesion, or abnormal extra-axial fluid collection. No definite CT evidence for acute infarction. Diffuse loss of parenchymal volume is consistent with atrophy. Patchy low attenuation in the deep hemispheric and periventricular white matter is nonspecific, but likely reflects chronic microvascular ischemic demyelination. Vascular: No hyperdense vessel or unexpected calcification. Skull: No evidence for fracture. No worrisome lytic or sclerotic lesion. Sinuses/Orbits: The visualized paranasal sinuses and mastoid air cells are clear. Visualized portions of the globes and intraorbital fat  are unremarkable. Other: None. IMPRESSION: 1. Stable.  No acute intracranial abnormality. 2. Atrophy with chronic small vessel white matter disease. Electronically Signed   By: Misty Stanley M.D.   On: 02/22/2017 17:53    Scheduled Meds: . atorvastatin  10 mg Oral QPM  . dorzolamide  1 drop Right Eye BID  . pantoprazole  40 mg Oral Daily  . senna  1 tablet Oral BID  . sodium chloride flush  3 mL Intravenous Q12H  . sotalol  80 mg Oral Q12H  . warfarin  5 mg Oral ONCE-1800  . Warfarin - Pharmacist Dosing Inpatient   Does not apply q1800   Continuous Infusions: . diltiazem (CARDIZEM) infusion 10 mg/hr (02/23/17 1200)     LOS: 0 days   CHIU, Orpah Melter, MD [facility] Pager [phone]  If 7PM-7AM, please contact night-coverage [email or web address] Password [email or web address] 02/23/2017, 1:48 PM

## 2017-02-23 NOTE — Progress Notes (Signed)
ANTICOAGULATION CONSULT NOTE - Follow Up Consult  Pharmacy Consult for Warfarin Indication: atrial fibrillation  Allergies  Allergen Reactions  . Tramadol Nausea Only    Patient Measurements: Height: 5\' 5"  (165.1 cm) Weight: 185 lb 6.5 oz (84.1 kg) IBW/kg (Calculated) : 57  Vital Signs: Temp: 97.5 F (36.4 C) (05/01 0800) Temp Source: Oral (05/01 0800) BP: 134/90 (05/01 0800) Pulse Rate: 74 (05/01 0800)  Labs:  Recent Labs  02/22/17 1724 02/22/17 2247 02/23/17 0454 02/23/17 1005  HGB 14.4  --  12.9  --   HCT 43.3  --  41.1  --   PLT 246  --  214  --   LABPROT 26.3*  --  26.7*  --   INR 2.37  --  2.41  --   CREATININE 1.18*  --  0.99  --   TROPONINI  --  <0.03 <0.03 <0.03    Estimated Creatinine Clearance: 47.7 mL/min (by C-G formula based on SCr of 0.99 mg/dL).   Medical History: Past Medical History:  Diagnosis Date  . Aortic insufficiency    mild by echo 2017  . Cancer ([place])    Skin cancer- basal.  1 mylenoma  . Complication of anesthesia   . DCM (dilated cardiomyopathy) ([place])    EF 40-45% by echo 2017 - nuclear stress test with no ischemia  . GERD (gastroesophageal reflux disease)   . H/O benign essential tremor    on amiodarone resolved off amio  . H/O hyperkalemia    Resolved off ACE inhibitors  . High cholesterol   . History of blood transfusion   . Hyperparathyroidism ([place])   . Hypertension   . Mitral regurgitation    mild to moderate by echo 2017  . Persistent atrial fibrillation ([place])   . PONV (postoperative nausea and vomiting)   . PVC's (premature ventricular contractions)   . Renal insufficiency    MILD  . Shortness of breath    with exertion  . Tachycardia-bradycardia syndrome [facility])    s/p PPM 02/2013    Assessment: 83 yr female admitted with AFib with RVR.  PMH significant for AFib on warfarin, HTN, tachybrady syndrome s/p PPM, CKD.  Pharmacy to manage warfarin dosing.  PTA Warfarin regimen: 5mg  daily except 2.5mg  on Thur& Sun;  Last dose reported as taken on 4/29 @ 18:00  Significant events: 02/16/17, she underwent low back injections and had held her coumadin for 5 days prior to that, but has since restarted and INR therapeutic on admission  Today, 02/23/2017: - INR = 2.41 - Hgb, platelets WNL - no drug interactions noted  Goal of Therapy:  INR 2-3   Plan:   Warfarin 5mg  po x 1 tonight  Check daily INR  Hershal Coria, PharmD 02/23/2017,10:49 AM

## 2017-02-23 NOTE — Evaluation (Signed)
Physical Therapy Evaluation Patient Details Name: Molly [name] MRN: [id number] DOB: Aug 31, 1933 Today's Date: 02/23/2017   History of Present Illness  pt was admitted with fatique and palpitations.  Had spinal injection 5 days PTA.  PMH:  PAF, HTN, PVCs, tachycardia bradycardia syndrome, and CKR     Clinical Impression  On eval, pt required min assist for mobility. She walked ~60 feet with a straight cane. Unsteady at times. Pt fatigues easily. HR 111 bpm during ambulation. Pt presents with general weakness, decreased activity tolerance, and impaired gait and balance. Recommend HHPT f/u. Also recommend walker for ambulation safety if pt is agreeable.     Follow Up Recommendations Home health PT;Supervision/Assistance - 24 hour    Equipment Recommendations   (4 wheeled rolling walker -if pt is agreeable to use)   Recommendations for Other Services       Precautions / Restrictions Precautions Precautions: Fall Restrictions Weight Bearing Restrictions: No      Mobility  Bed Mobility Overal bed mobility: Needs Assistance Bed Mobility: Supine to Sit;Sit to Supine     Supine to sit: Supervision;HOB elevated Sit to supine: Supervision;HOB elevated   General bed mobility comments: for lines.   Transfers Overall transfer level: Needs assistance Equipment used: Straight cane Transfers: Sit to/from Stand Sit to Stand: Min assist         General transfer comment: small amount of assist to steady.   Ambulation/Gait Ambulation/Gait assistance: Min assist Ambulation Distance (Feet): 60 Feet Assistive device: Straight cane Gait Pattern/deviations: Step-through pattern;Decreased stride length     General Gait Details: Steadying assist required. Slow gait speed. Pt fatigue easily. HR 111 bpm.   Stairs            Wheelchair Mobility    Modified Rankin (Stroke Patients Only)       Balance Overall balance assessment: Needs assistance         Standing  balance support: Single extremity supported Standing balance-Leahy Scale: Poor                               Pertinent Vitals/Pain Pain Assessment: No/denies pain    Home Living Family/patient expects to be discharged to:: Private residence Living Arrangements: Spouse/significant other Available Help at Discharge: Family           Home Equipment: Kasandra Knudsen - single point;Shower seat Additional Comments: has summer home with low toilet    Prior Function Level of Independence: Independent         Comments: has been difficult for her to get dressed but she has managed with extra time.  Doesn't use AE     Hand Dominance        Extremity/Trunk Assessment   Upper Extremity Assessment Upper Extremity Assessment: Defer to OT evaluation    Lower Extremity Assessment Lower Extremity Assessment: Generalized weakness    Cervical / Trunk Assessment Cervical / Trunk Assessment: Normal  Communication   Communication: No difficulties  Cognition Arousal/Alertness: Awake/alert Behavior During Therapy: WFL for tasks assessed/performed Overall Cognitive Status: Within Functional Limits for tasks assessed                                        General Comments      Exercises     Assessment/Plan    PT Assessment Patient needs continued PT services  PT Problem  List Decreased mobility;Decreased balance;Decreased activity tolerance;Decreased knowledge of use of DME;Decreased strength       PT Treatment Interventions DME instruction;Therapeutic activities;Therapeutic exercise;Patient/family education;Gait training;Functional mobility training;Balance training    PT Goals (Current goals can be found in the Care Plan section)  Acute Rehab PT Goals Patient Stated Goal: get strength back PT Goal Formulation: With patient/family Time For Goal Achievement: 03/09/17 Potential to Achieve Goals: Good    Frequency Min 3X/week   Barriers to discharge         Co-evaluation               AM-PAC PT "6 Clicks" Daily Activity  Outcome Measure Difficulty turning over in bed (including adjusting bedclothes, sheets and blankets)?: A Little Difficulty moving from lying on back to sitting on the side of the bed? : A Little Difficulty sitting down on and standing up from a chair with arms (e.g., wheelchair, bedside commode, etc,.)?: A Little Help needed moving to and from a bed to chair (including a wheelchair)?: A Little Help needed walking in hospital room?: A Little Help needed climbing 3-5 steps with a railing? : A Little 6 Click Score: 18    End of Session Equipment Utilized During Treatment: Gait belt Activity Tolerance: Patient limited by fatigue Patient left: in bed;with call bell/phone within reach;with family/visitor present;with bed alarm set   PT Visit Diagnosis: Muscle weakness (generalized) (M62.81);Difficulty in walking, not elsewhere classified (R26.2)    Time: 6761-9509 PT Time Calculation (min) (ACUTE ONLY): 17 min   Charges:   PT Evaluation $PT Eval Low Complexity: 1 Procedure     PT G Codes:   PT G-Codes **NOT FOR INPATIENT CLASS** Functional Assessment Tool Used: AM-PAC 6 Clicks Basic Mobility;Clinical judgement Functional Limitation: Mobility: Walking and moving around Mobility: Walking and Moving Around Current Status (T2671): At least 20 percent but less than 40 percent impaired, limited or restricted Mobility: Walking and Moving Around Goal Status [phone]): At least 1 percent but less than 20 percent impaired, limited or restricted      Weston Anna, MPT Pager: [phone]

## 2017-02-23 NOTE — Evaluation (Addendum)
Occupational Therapy Evaluation Patient Details Name: Molly [name] MRN: [id number] DOB: 1933-09-13 Today's Date: 02/23/2017    History of Present Illness pt was admitted with fatique and palpitations.  Had spinal injection 5 days PTA.  PMH:  PAF, HTN, PVCs, tachycardia bradycardia syndrome, and CKR    Clinical Impression   This 83 year old female was admitted for the above. At baseline, she is mod I (extra time and effort) for adls.  She currently needs up to max A for LB adls due to fatique. She will benefit from continued OT to increase independence with adls.  Goals are for min guard level    Follow Up Recommendations  Supervision/Assistance - 24 hour    Equipment Recommendations  3 in 1 bedside commode (possibly)    Recommendations for Other Services       Precautions / Restrictions Precautions Precautions: Fall Restrictions Weight Bearing Restrictions: No      Mobility Bed Mobility Overal bed mobility: Needs Assistance Bed Mobility: Supine to Sit;Sit to Supine     Supine to sit: Supervision;HOB elevated Sit to supine: Min assist;HOB elevated   General bed mobility comments: assist for legs when getting back to bed  Transfers Overall transfer level: Needs assistance Equipment used: Straight cane Transfers: Sit to/from Stand Sit to Stand: Min assist         General transfer comment: steadying assistance    Balance                                           ADL either performed or assessed with clinical judgement   ADL Overall ADL's : Needs assistance/impaired     Grooming: Wash/dry hands;Min guard;Standing   Upper Body Bathing: Set up;Sitting   Lower Body Bathing: Moderate assistance;Sit to/from stand   Upper Body Dressing : Minimal assistance;Sitting   Lower Body Dressing: Maximal assistance;Sit to/from stand   Toilet Transfer: Minimal assistance;Ambulation;BSC;RW   Toileting- Clothing Manipulation and Hygiene: Minimal  assistance;Sitting/lateral lean         General ADL Comments: ambulated to bathroom; used commode and washed hands. Pt fatiqued at end of doing this.  She used cane but also furniture walked:  may benefit from using RW next time     Vision         Perception     Praxis      Pertinent Vitals/Pain Pain Assessment: No/denies pain     Hand Dominance     Extremity/Trunk Assessment Upper Extremity Assessment Upper Extremity Assessment: Overall WFL for tasks assessed           Communication Communication Communication: No difficulties   Cognition Arousal/Alertness: Awake/alert Behavior During Therapy: WFL for tasks assessed/performed Overall Cognitive Status: Within Functional Limits for tasks assessed                                     General Comments   HR 80-117.  Could not get standing BP.  Initially bp 138/80; when sitting EOB 124/100.  Informed covering RN    Exercises     Shoulder Instructions      Home Living Family/patient expects to be discharged to:: Private residence Living Arrangements: Spouse/significant other Available Help at Discharge: Family               Bathroom Shower/Tub: Walk-in  shower   Bathroom Toilet: Handicapped height     Home Equipment: [employer] - single point;Shower seat   Additional Comments: has summer home with low toilet      Prior Functioning/Environment Level of Independence: Independent        Comments: has been difficult for her to get dressed but she has managed with extra time.  Doesn't use AE        OT Problem List: Decreased strength;Decreased activity tolerance;Impaired balance (sitting and/or standing);Decreased knowledge of use of DME or AE;Cardiopulmonary status limiting activity      OT Treatment/Interventions: Self-care/ADL training;DME and/or AE instruction;Balance training;Patient/family education;Energy conservation    OT Goals(Current goals can be found in the care plan section)  Acute Rehab OT Goals Patient Stated Goal: get strength back OT Goal Formulation: With patient Time For Goal Achievement: 03/02/17 Potential to Achieve Goals: Good ADL Goals Pt Will Perform Lower Body Bathing: with min guard assist;with adaptive equipment;sit to/from stand Pt Will Perform Lower Body Dressing: with min guard assist;with adaptive equipment;sit to/from stand Pt Will Transfer to Toilet: with min guard assist;ambulating;bedside commode Pt Will Perform Toileting - Clothing Manipulation and hygiene: with min guard assist;sit to/from stand Additional ADL Goal #1: pt will perform bed mobility from a flat bed at supervision level  OT Frequency: Min 2X/week   Barriers to D/C:            Co-evaluation              AM-PAC PT "6 Clicks" Daily Activity     Outcome Measure Help from another person eating meals?: None Help from another person taking care of personal grooming?: A Little Help from another person toileting, which includes using toliet, bedpan, or urinal?: A Little Help from another person bathing (including washing, rinsing, drying)?: A Lot Help from another person to put on and taking off regular upper body clothing?: A Little Help from another person to put on and taking off regular lower body clothing?: A Lot 6 Click Score: 17   End of Session    Activity Tolerance: Patient limited by fatigue Patient left: in bed;with call bell/phone within reach;with family/visitor present  OT Visit Diagnosis: Unsteadiness on feet (R26.81)                Time: 5885-0277 OT Time Calculation (min): 26 min Charges:  OT General Charges $OT Visit: 1 Procedure OT Evaluation $OT Eval Low Complexity: 1 Procedure G-Codes: OT G-codes **NOT FOR INPATIENT CLASS** Functional Assessment Tool Used: Clinical judgement Functional Limitation: Self care Self Care Current Status (A1287): At least 60 percent but less than 80 percent impaired, limited or restricted Self Care Goal Status  (O6767): At least 20 percent but less than 40 percent impaired, limited or restricted   Molly [name], OTR/L [phone] 02/23/2017  Molly [name] 02/23/2017, 3:33 PM

## 2017-02-24 DIAGNOSIS — I5022 Chronic systolic (congestive) heart failure: Secondary | ICD-10-CM

## 2017-02-24 DIAGNOSIS — Z6831 Body mass index (BMI) 31.0-31.9, adult: Secondary | ICD-10-CM

## 2017-02-24 DIAGNOSIS — E6609 Other obesity due to excess calories: Secondary | ICD-10-CM

## 2017-02-24 LAB — BASIC METABOLIC PANEL
Anion gap: 7 (ref 5–15)
BUN: 26 mg/dL — ABNORMAL HIGH (ref 6–20)
CHLORIDE: 111 mmol/L (ref 101–111)
CO2: 23 mmol/L (ref 22–32)
CREATININE: 0.92 mg/dL (ref 0.44–1.00)
Calcium: 9.8 mg/dL (ref 8.9–10.3)
GFR calc non Af Amer: 57 mL/min — ABNORMAL LOW (ref >60)
Glucose, Bld: 107 mg/dL — ABNORMAL HIGH (ref 65–99)
POTASSIUM: 4.6 mmol/L (ref 3.5–5.1)
Sodium: 141 mmol/L (ref 135–145)

## 2017-02-24 LAB — PROTIME-INR
INR: 2.95
Prothrombin Time: 31.4 seconds — ABNORMAL HIGH (ref 11.4–15.2)

## 2017-02-24 MED ORDER — CARVEDILOL 3.125 MG PO TABS
3.1250 mg | ORAL_TABLET | Freq: Two times a day (BID) | ORAL | Status: DC
  Administered 2017-02-24 – 2017-02-25 (×2): 3.125 mg via ORAL
  Filled 2017-02-24 (×2): qty 1

## 2017-02-24 MED ORDER — WARFARIN SODIUM 3 MG PO TABS
3.0000 mg | ORAL_TABLET | Freq: Once | ORAL | Status: AC
  Administered 2017-02-24: 3 mg via ORAL
  Filled 2017-02-24 (×2): qty 1

## 2017-02-24 MED ORDER — LOSARTAN POTASSIUM 25 MG PO TABS
25.0000 mg | ORAL_TABLET | Freq: Every day | ORAL | Status: DC
Start: 2017-02-24 — End: 2017-02-25
  Administered 2017-02-24 – 2017-02-25 (×2): 25 mg via ORAL
  Filled 2017-02-24 (×2): qty 1

## 2017-02-24 NOTE — Telephone Encounter (Signed)
LMTCB//sss 

## 2017-02-24 NOTE — Progress Notes (Signed)
Occupational Therapy Treatment Patient Details Name: Molly [name] MRN: [id number] DOB: [birth year] Today's Date: 02/24/2017    History of present illness pt was admitted with fatique and palpitations.  Had spinal injection 5 days PTA.  PMH:  PAF, HTN, PVCs, tachycardia bradycardia syndrome, and CKR    OT comments  Pt hopes for d/c tomorrow. Would benefit from continued OT (HHOT)  Follow Up Recommendations  Home health OT;Supervision/Assistance - 24 hour    Equipment Recommendations  3 in 1 bedside commode    Recommendations for Other Services      Precautions / Restrictions Precautions Precautions: Fall Restrictions Weight Bearing Restrictions: No       Mobility Bed Mobility         Supine to sit: Supervision;HOB elevated Sit to supine: Supervision;HOB elevated      Transfers Overall transfer level: Needs assistance Equipment used: Rolling walker (2 wheeled)   Sit to Stand: Min guard         General transfer comment: cues for UE placement    Balance                                           ADL either performed or assessed with clinical judgement   ADL                       Lower Body Dressing: Minimal assistance;Sit to/from stand;With adaptive equipment   Toilet Transfer: Min guard;Ambulation;Regular Toilet;RW             General ADL Comments: educated on AE:  tried sock aide, wide sock aide and reacher.  Pt is interested in reacher and wide sock aide. We will trade standard for wide tomorrow, if her husband picks this up.  Also tried "as seen on tv" model, but that didn't work for her.  Pt does want 3:1 to go over her toilet.       Vision       Perception     Praxis      Cognition Arousal/Alertness: Awake/alert Behavior During Therapy: WFL for tasks assessed/performed Overall Cognitive Status: Within Functional Limits for tasks assessed                                          Exercises      Shoulder Instructions       General Comments      Pertinent Vitals/ Pain       Pain Assessment: 0-10 Pain Score: 4  Pain Location: headache Pain Descriptors / Indicators: Aching Pain Intervention(s): RN gave pain meds during session  Home Living                                          Prior Functioning/Environment              Frequency  Min 2X/week        Progress Toward Goals  OT Goals(current goals can now be found in the care plan section)  Progress towards OT goals: Progressing toward goals  Acute Rehab OT Goals Patient Stated Goal: get strength back  Plan Discharge plan needs to be updated    Co-evaluation  AM-PAC PT "6 Clicks" Daily Activity     Outcome Measure   Help from another person eating meals?: None Help from another person taking care of personal grooming?: A Little Help from another person toileting, which includes using toliet, bedpan, or urinal?: A Little Help from another person bathing (including washing, rinsing, drying)?: A Lot Help from another person to put on and taking off regular upper body clothing?: A Little Help from another person to put on and taking off regular lower body clothing?: A Little 6 Click Score: 18    End of Session    OT Visit Diagnosis: Unsteadiness on feet (R26.81)   Activity Tolerance Patient limited by fatigue   Patient Left in bed;with call bell/phone within reach   Nurse Communication          Time: 6294-7654 OT Time Calculation (min): 41 min  Charges: OT General Charges $OT Visit: 1 Procedure OT Treatments $Self Care/Home Management : 23-37 mins  Lesle Chris, OTR/L [phone] 02/24/2017   [place] 02/24/2017, 3:21 PM

## 2017-02-24 NOTE — Care Management Note (Signed)
Case Management Note  Patient Details  Name: Molly [name] MRN: [id number] Date of Birth: 1933-08-13  Subjective/Objective:          A,fib requiring iv cardizem drip          Action/Plan: From home Will follow for dc needs  Expected Discharge Date:   (unknown)               Expected Discharge Plan:  Home/Self Care  In-House Referral:     Discharge planning Services     Post Acute Care Choice:    Choice offered to:     DME Arranged:    DME Agency:     HH Arranged:    HH Agency:     Status of Service:  In process, will continue to follow  If discussed at Long Length of Stay Meetings, dates discussed:    Additional Comments:  Leeroy Cha, RN 02/24/2017, 10:05 AM

## 2017-02-24 NOTE — Progress Notes (Signed)
PT Cancellation Note  Patient Details Name: DESIRAE MANCUSI MRN: [id number] DOB: [birth year]   Cancelled Treatment:    Reason Eval/Treat Not Completed: Pt currently working with OT. Will check back if schedule allows. Thanks.    Weston Anna, MPT Pager: [phone]

## 2017-02-24 NOTE — Progress Notes (Signed)
Progress Note  Patient Name: Molly [name] Date of Encounter: 02/24/2017  Primary Cardiologist: Dr. Radford Pax  Subjective   No complaints this am.  Denies any SOB or palpitations  Inpatient Medications    Scheduled Meds: . atorvastatin  10 mg Oral QPM  . dorzolamide  1 drop Right Eye BID  . pantoprazole  40 mg Oral Daily  . senna  1 tablet Oral BID  . sodium chloride flush  3 mL Intravenous Q12H  . sodium chloride flush  3 mL Intravenous Q12H  . sotalol  80 mg Oral Q12H  . Warfarin - Pharmacist Dosing Inpatient   Does not apply q1800   Continuous Infusions: . sodium chloride 250 mL (02/24/17 0800)  . diltiazem (CARDIZEM) infusion 10 mg/hr (02/24/17 0800)   PRN Meds: acetaminophen **OR** acetaminophen, ketorolac, LORazepam, ondansetron **OR** ondansetron (ZOFRAN) IV, polyethylene glycol, sodium chloride flush   Vital Signs    Vitals:   02/24/17 0400 02/24/17 0500 02/24/17 0600 02/24/17 0705  BP: 136/88  126/72   Pulse: 71  70   Resp: (!) 24  14   Temp:    97.1 F (36.2 C)  TempSrc:    Axillary  SpO2: 95%  90%   Weight:  187 lb 9.8 oz (85.1 kg)    Height:        Intake/Output Summary (Last 24 hours) at 02/24/17 1000 Last data filed at 02/24/17 0800  Gross per 24 hour  Intake           242.33 ml  Output              650 ml  Net          -407.67 ml   Filed Weights   02/22/17 2214 02/23/17 0400 02/24/17 0500  Weight: 185 lb 6.5 oz (84.1 kg) 185 lb 6.5 oz (84.1 kg) 187 lb 9.8 oz (85.1 kg)    Telemetry    Atrial fibrillation rate controlled - Personally Reviewed  ECG    No new EKG to review - Personally Reviewed  Physical Exam   GEN: WD, WN in NAD Neck: no JVD or carotid bruit Cardiac: irregularly irregular with no M/R/G Respiratory: CTA bilaterally GI: soft, NT, ND with active BS MS: no edema or deformity Neuro:  A&O x 3 Psych: normal affect  Labs    Chemistry Recent Labs Lab 02/22/17 1724 02/23/17 0454 02/24/17 0316  NA 138 140 141  K  4.5 4.8 4.6  CL 107 108 111  CO2 22 25 23   GLUCOSE 131* 108* 107*  BUN 32* 27* 26*  CREATININE 1.18* 0.99 0.92  CALCIUM 10.5* 10.2 9.8  PROT  --  6.2*  --   ALBUMIN  --  3.6  --   AST  --  19  --   ALT  --  13*  --   ALKPHOS  --  43  --   BILITOT  --  0.9  --   GFRNONAA 42* 52* 57*  GFRAA 49* >60 >60  ANIONGAP 9 7 7      Hematology Recent Labs Lab 02/22/17 1724 02/23/17 0454  WBC 10.8* 9.5  RBC 4.17 3.90  HGB 14.4 12.9  HCT 43.3 41.1  MCV 103.8* 105.4*  MCH 34.5* 33.1  MCHC 33.3 31.4  RDW 14.2 14.1  PLT 246 214    Cardiac Enzymes Recent Labs Lab 02/22/17 2247 02/23/17 0454 02/23/17 1005  TROPONINI <0.03 <0.03 <0.03    Recent Labs Lab 02/22/17 1732  TROPIPOC 0.01  BNPNo results for input(s): BNP, PROBNP in the last 168 hours.   DDimer No results for input(s): DDIMER in the last 168 hours.   Radiology    Dg Chest 2 View  Result Date: 02/22/2017 CLINICAL DATA:  Abnormal EKG, atrial fibrillation EXAM: CHEST  2 VIEW COMPARISON:  06/08/2014 FINDINGS: Left-sided duo lead pacing device, similar compared to prior. Slightly elevated right diaphragm as before. No acute infiltrate or effusion. Stable cardiomediastinal silhouette with prominent mitral calcification. No pneumothorax. Scoliosis and degenerative changes of the spine. IMPRESSION: No active cardiopulmonary disease. Electronically Signed   By: Donavan Foil M.D.   On: 02/22/2017 17:57   Ct Head Wo Contrast  Result Date: 02/22/2017 CLINICAL DATA:  Severe headache. EXAM: CT HEAD WITHOUT CONTRAST TECHNIQUE: Contiguous axial images were obtained from the base of the skull through the vertex without intravenous contrast. COMPARISON:  08/15/2015 FINDINGS: Brain: There is no evidence for acute hemorrhage, hydrocephalus, mass lesion, or abnormal extra-axial fluid collection. No definite CT evidence for acute infarction. Diffuse loss of parenchymal volume is consistent with atrophy. Patchy low attenuation in the  deep hemispheric and periventricular white matter is nonspecific, but likely reflects chronic microvascular ischemic demyelination. Vascular: No hyperdense vessel or unexpected calcification. Skull: No evidence for fracture. No worrisome lytic or sclerotic lesion. Sinuses/Orbits: The visualized paranasal sinuses and mastoid air cells are clear. Visualized portions of the globes and intraorbital fat are unremarkable. Other: None. IMPRESSION: 1. Stable.  No acute intracranial abnormality. 2. Atrophy with chronic small vessel white matter disease. Electronically Signed   By: Misty Stanley M.D.   On: 02/22/2017 17:53    Cardiac Studies  2D echo 02/23/2017 Study Conclusions  - Left ventricle: The cavity size was normal. Wall thickness was   increased in a pattern of mild LVH. Systolic function was   severely reduced. The estimated ejection fraction was in the   range of 20% to 25%. Diffuse hypokinesis. - Aortic valve: There was mild regurgitation. - Mitral valve: Calcified annulus. There was severe regurgitation. - Left atrium: The atrium was severely dilated. - Right ventricle: The cavity size was mildly dilated. - Right atrium: The atrium was moderately dilated. - Tricuspid valve: There was moderate regurgitation. - Pulmonary arteries: Systolic pressure was moderately increased.   PA peak pressure: 58 mm Hg (S).  Impressions:  - Severe global reduction in LV systolic function; mild LVH;l mild   AI; severe MR; biatrial enlargement; mild RVE; moderate TR;   moderately elevated pulmonary pressure.  Patient Profile     83 y.o. female with history of difficult to treat persistent atrial fibrillation.  She had been on amio at one point but this was stopped due to a significant tremor.  She takes coumadin but this was held a week ago for a back injection but is now back on it with a therapeutic INR.  Admitted  With 1 week history of palpitations and fatigue and found to be back in afib with RVR.   Started on IV Cardizem gtt at 5mg /hr but still with elevated HR as high as 120's especially with ambulation.  Trop neg x 3.  She has known mild DCM with EF 40-45% by recent echo in February.  She also has tachybrady syndrome and is followed in device clinic for PPM  Assessment & Plan     1. Atrial Fibrillation w/ RVR:  - She has converted to atrial paced rhythm - She is on IV Cardizem at 10 mg/hr.  I will stop Cardizem given  her recent worsening LVF. - Continue sotalol and increase to 80mg  BID (she had been taking 80mg  qam and 40mg  qpm). - INR therapeutic at 2.95   2. Chronic Systolic HF/nonischemic DCM - most recent echo 11/2016 with mildly decreased LVEF at 40-45% but repeat echo yesterday shows a significant decline in EF at 20-25% which likely is related to tachycardia induced CM.  She has been in afib for at least a week. - She is net positive 700cc but does not appear volume overloaded on exam.   - Stop Cardizem - renal function stable so will add Losartan 25mg  daily.   - start Coreg 3.125mg  BID - repeat echo in 2 months to make sure LVF has improved - ok to transfer to tele bed and likely discharge home if stable tomorrow  3. Mitral Regurgitation: mild to moderate on echo 11/2016. Repeat echo with severe MR but likely related to annular dilatation from DCM and worsening LVF.  Will repeat echo in 2 months to see if LVF and MR have improved.   4. Tachy-Brady Syndrome: has PPM, followed by Dr. [name].   5. Chronic Systemic Anticoagulation: on coumadin for PAF. INR therapeutic at 2.41. Was temporarily held last week for low back injection.   6. HTN: controlled.   Signed, Fransico Him, MD  02/24/2017, 10:00 AM

## 2017-02-24 NOTE — Progress Notes (Signed)
OT Cancellation Note  Patient Details Name: Molly [name] MRN: [id number] DOB: 1933/04/13   Cancelled Treatment:    Reason Eval/Treat Not Completed: Other (comment).  Pt has been NPO, but she will be able to eat lunch. She requests that therapy return after that.  [name] 02/24/2017, 10:31 AM  Lesle Chris, OTR/L [phone] 02/24/2017

## 2017-02-24 NOTE — Progress Notes (Signed)
[facility] PROGRESS NOTE  Molly [name] HBZ:[id number] DOB: 06-May-1933 DOA: 02/22/2017 PCP: Gara Kroner, MD  Interim Summary and HPI 83 year old female with past medical history significant for paroxysmal atrial fibrillation (chronically on Coumadin for anticoagulation), hypertension, chronic systolic heart failure (last 2-D echo prior to admission 40-45% ejection fraction), tachybradycardia syndrome status post pacemaker implantation, GERD, chronic kidney disease and aortic insufficiency; who presented to the hospital secondary to approximately 5 days of increased fatigue and palpitation. Patient was found to be on atrial fibrillation with RVR.  Assessment/Plan: 1-A. fib with RVR -Will resume Sotalol 80mg  BID now; also started on coreg. -cardizem has been discontinued, will monitor on telemetry -continue coumadin -ChadsVasc score 4 -Cardiology service on board, will follow recommendations.  2-chronic systolic heart failure/nonischemic dilated cardiomyopathy -With worsening ejection fraction in comparison to echo done in March 2018; currently EF 20-25%; Most likely triggered by tachycardia and diffuse cardiomyopathy. -Patient will be started on carvedilol and losartan -Daily weights and strict intake and output -Low sodium diet -She is status post pacemaker and the plan is to repeat 2-D echo in 2 months for further assessment on her left ventricular function. -Cardiology is on board and aware, will follow recommendations. -Patient condition is compensated at this moment.  3-tachybradycardia syndrome -Status post pacemaker implantation, will continue outpatient follow-up by Dr. [name]  4-hypertension: -Blood pressure is a stable -Will follow low sodium/heart healthy diet and will assess response with adjustment of her current medications (adding losartan and Coreg).  5-Class 1 obesity -Low calorie diet and exercise has been discussed with patient -Body mass index is  31.22 kg/m.  6-mitral regurgitation: -Appears to be moderate to severe in repeated echo -Will plan is to follow back cardiology service after discharge. -Continue control of her body and statins, point hypertension  Code Status: Full code Family Communication: Husband at bedside Disposition Plan: Patient has now converted to atrial paced rhythm with controlled heart rate; no chest pain, no shortness of breath, no nausea or vomiting. Per cardiology recommendations will hold off on cardioversion at this moment; transferred to telemetry bed and stop Cardizem. Medication has been adjusted and will follow their recommendations.   Consultants:  Cardiology  Procedures:  2-D echo - Left ventricle: The cavity size was normal. Wall thickness was   increased in a pattern of mild LVH. Systolic function was   severely reduced. The estimated ejection fraction was in the   range of 20% to 25%. Diffuse hypokinesis. - Aortic valve: There was mild regurgitation. - Mitral valve: Calcified annulus. There was severe regurgitation. - Left atrium: The atrium was severely dilated. - Right ventricle: The cavity size was mildly dilated. - Right atrium: The atrium was moderately dilated. - Tricuspid valve: There was moderate regurgitation. - Pulmonary arteries: Systolic pressure was moderately increased.   PA peak pressure: 58 mm Hg (S).  Impressions: - Severe global reduction in LV systolic function; mild LVH;l mild   AI; severe MR; biatrial enlargement; mild RVE; moderate TR;   moderately elevated pulmonary pressure.  Antibiotics:  None  HPI/Subjective: Afebrile, patient denies chest pain. Endorses breathing is a stable and currently is not complaining of palpitations. No nausea, no vomiting, no dysuria.  Objective: Vitals:   02/24/17 0400 02/24/17 0600  BP: 136/88 126/72  Pulse: 71 70  Resp: (!) 24 14  Temp:      Intake/Output Summary (Last 24 hours) at 02/24/17 0856 Last data filed  at 02/24/17 0800  Gross per 24 hour  Intake  245.33 ml  Output              650 ml  Net          -404.67 ml   Filed Weights   02/22/17 2214 02/23/17 0400 02/24/17 0500  Weight: 84.1 kg (185 lb 6.5 oz) 84.1 kg (185 lb 6.5 oz) 85.1 kg (187 lb 9.8 oz)    Exam:   General: Afebrile, no chest pain, a good oxygen saturation on room air and denying palpitations. Patient heart rate is now controlled and the patient has converted to atrial pace rhythm.  Cardiovascular: Positive systolic ejection murmur, no rubs, no gallops, rate control; no JVDs.  Respiratory: Good air appointment, no crackles, no wheezing, no using accessory muscles.  Abdomen: Soft, nontender, nondistended, positive bowel sounds  Musculoskeletal: Trace edema bilaterally, no cyanosis, no clubbing  Data Reviewed: Basic Metabolic Panel:  Recent Labs Lab 02/22/17 1724 02/23/17 0454 02/24/17 0316  NA 138 140 141  K 4.5 4.8 4.6  CL 107 108 111  CO2 22 25 23   GLUCOSE 131* 108* 107*  BUN 32* 27* 26*  CREATININE 1.18* 0.99 0.92  CALCIUM 10.5* 10.2 9.8  MG  --  1.9  --   PHOS  --  3.2  --    Liver Function Tests:  Recent Labs Lab 02/23/17 0454  AST 19  ALT 13*  ALKPHOS 43  BILITOT 0.9  PROT 6.2*  ALBUMIN 3.6   CBC:  Recent Labs Lab 02/22/17 1724 02/23/17 0454  WBC 10.8* 9.5  HGB 14.4 12.9  HCT 43.3 41.1  MCV 103.8* 105.4*  PLT 246 214   Cardiac Enzymes:  Recent Labs Lab 02/22/17 2247 02/23/17 0454 02/23/17 1005  TROPONINI <0.03 <0.03 <0.03    CBG: No results for input(s): GLUCAP in the last 168 hours.  Recent Results (from the past 240 hour(s))  MRSA PCR Screening     Status: None   Collection Time: 02/22/17 10:10 PM  Result Value Ref Range Status   MRSA by PCR NEGATIVE NEGATIVE Final    Comment:        The GeneXpert MRSA Assay (FDA approved for NASAL specimens only), is one component of a comprehensive MRSA colonization surveillance program. It is not intended to  diagnose MRSA infection nor to guide or monitor treatment for MRSA infections.      Studies: Dg Chest 2 View  Result Date: 02/22/2017 CLINICAL DATA:  Abnormal EKG, atrial fibrillation EXAM: CHEST  2 VIEW COMPARISON:  06/08/2014 FINDINGS: Left-sided duo lead pacing device, similar compared to prior. Slightly elevated right diaphragm as before. No acute infiltrate or effusion. Stable cardiomediastinal silhouette with prominent mitral calcification. No pneumothorax. Scoliosis and degenerative changes of the spine. IMPRESSION: No active cardiopulmonary disease. Electronically Signed   By: Donavan Foil M.D.   On: 02/22/2017 17:57   Ct Head Wo Contrast  Result Date: 02/22/2017 CLINICAL DATA:  Severe headache. EXAM: CT HEAD WITHOUT CONTRAST TECHNIQUE: Contiguous axial images were obtained from the base of the skull through the vertex without intravenous contrast. COMPARISON:  08/15/2015 FINDINGS: Brain: There is no evidence for acute hemorrhage, hydrocephalus, mass lesion, or abnormal extra-axial fluid collection. No definite CT evidence for acute infarction. Diffuse loss of parenchymal volume is consistent with atrophy. Patchy low attenuation in the deep hemispheric and periventricular white matter is nonspecific, but likely reflects chronic microvascular ischemic demyelination. Vascular: No hyperdense vessel or unexpected calcification. Skull: No evidence for fracture. No worrisome lytic or sclerotic lesion. Sinuses/Orbits: The visualized paranasal  sinuses and mastoid air cells are clear. Visualized portions of the globes and intraorbital fat are unremarkable. Other: None. IMPRESSION: 1. Stable.  No acute intracranial abnormality. 2. Atrophy with chronic small vessel white matter disease. Electronically Signed   By: Misty Stanley M.D.   On: 02/22/2017 17:53    Scheduled Meds: . atorvastatin  10 mg Oral QPM  . dorzolamide  1 drop Right Eye BID  . pantoprazole  40 mg Oral Daily  . senna  1 tablet Oral  BID  . sodium chloride flush  3 mL Intravenous Q12H  . sodium chloride flush  3 mL Intravenous Q12H  . sotalol  80 mg Oral Q12H  . Warfarin - Pharmacist Dosing Inpatient   Does not apply q1800   Continuous Infusions: . sodium chloride 250 mL (02/24/17 0800)  . diltiazem (CARDIZEM) infusion 10 mg/hr (02/24/17 0800)    Active Problems:   Essential hypertension   Persistent atrial fibrillation (HCC)   Atrial fibrillation with RVR (HCC)   Mitral regurgitation   Aortic insufficiency   DCM (dilated cardiomyopathy) ([place])    Time spent: 30 minutes    Barton Dubois  [facility] Pager [phone]. If 7PM-7AM, please contact night-coverage at [email or web address], password [facility] 02/24/2017, 8:56 AM  LOS: 1 day

## 2017-02-24 NOTE — Progress Notes (Signed)
PT Cancellation Note  Patient Details Name: Molly [name] MRN: [id number] DOB: February 12, 1933   Cancelled Treatment:    Reason Eval/Treat Not Completed: Attempted PT tx session-pt declined due to fatigue. Will check back another day.   Weston Anna, MPT Pager: [phone]

## 2017-02-24 NOTE — Progress Notes (Signed)
ANTICOAGULATION CONSULT NOTE - Follow Up Consult  Pharmacy Consult for Warfarin Indication: atrial fibrillation  Allergies  Allergen Reactions  . Tramadol Nausea Only    Patient Measurements: Height: 5\' 5"  (165.1 cm) Weight: 187 lb 9.8 oz (85.1 kg) IBW/kg (Calculated) : 57  Vital Signs: Temp: 97.1 F (36.2 C) (05/02 0705) Temp Source: Axillary (05/02 0705) BP: 128/66 (05/02 1200) Pulse Rate: 71 (05/02 1200)  Labs:  Recent Labs  02/22/17 1724 02/22/17 2247 02/23/17 0454 02/23/17 1005 02/24/17 0316  HGB 14.4  --  12.9  --   --   HCT 43.3  --  41.1  --   --   PLT 246  --  214  --   --   LABPROT 26.3*  --  26.7*  --  31.4*  INR 2.37  --  2.41  --  2.95  CREATININE 1.18*  --  0.99  --  0.92  TROPONINI  --  <0.03 <0.03 <0.03  --     Estimated Creatinine Clearance: 51.6 mL/min (by C-G formula based on SCr of 0.92 mg/dL).   Medical History: Past Medical History:  Diagnosis Date  . Aortic insufficiency    mild by echo 2017  . Cancer ([place])    Skin cancer- basal.  1 mylenoma  . Complication of anesthesia   . DCM (dilated cardiomyopathy) ([place])    EF 40-45% by echo 2017 - nuclear stress test with no ischemia  . GERD (gastroesophageal reflux disease)   . H/O benign essential tremor    on amiodarone resolved off amio  . H/O hyperkalemia    Resolved off ACE inhibitors  . High cholesterol   . History of blood transfusion   . Hyperparathyroidism ([place])   . Hypertension   . Mitral regurgitation    mild to moderate by echo 2017  . Persistent atrial fibrillation ([place])   . PONV (postoperative nausea and vomiting)   . PVC's (premature ventricular contractions)   . Renal insufficiency    MILD  . Shortness of breath    with exertion  . Tachycardia-bradycardia syndrome [facility])    s/p PPM 02/2013    Assessment: 83 yr female admitted with AFib with RVR.  PMH significant for AFib on warfarin, HTN, tachybrady syndrome s/p PPM, CKD.  Pharmacy to manage warfarin dosing.  PTA  Warfarin regimen: 5mg  daily except 2.5mg  on Thurs & Sun; Last dose reported as taken on 4/29 @ 18:00  Significant events: 02/16/17, she underwent low back injections and had held her coumadin for 5 days prior to that, but has since restarted and INR therapeutic on admission  Today, 02/24/2017: - INR = 2.95 - Hgb, platelets WNL - no drug interactions noted  Goal of Therapy:  INR 2-3   Plan:   Given rise of INR to upper end of goal range, will give smaller dose of Warfarin 3mg  PO x 1 tonight.  Anticipate can return to home regimen thereafter.  Daily PT/INR.  Monitor closely for s/sx of bleeding.   Lindell Spar, PharmD, BCPS Pager: [phone] 02/24/2017 12:43 PM

## 2017-02-25 ENCOUNTER — Encounter (HOSPITAL_COMMUNITY)
Admission: EM | Disposition: A | Discharge status: Home / Self Care | Payer: Self-pay | Source: Home / Self Care | Attending: Internal Medicine

## 2017-02-25 ENCOUNTER — Other Ambulatory Visit: Payer: Self-pay | Admitting: Cardiology

## 2017-02-25 DIAGNOSIS — N183 Chronic kidney disease, stage 3 unspecified: Secondary | ICD-10-CM

## 2017-02-25 DIAGNOSIS — E785 Hyperlipidemia, unspecified: Secondary | ICD-10-CM

## 2017-02-25 DIAGNOSIS — I428 Other cardiomyopathies: Secondary | ICD-10-CM

## 2017-02-25 DIAGNOSIS — R0602 Shortness of breath: Secondary | ICD-10-CM

## 2017-02-25 DIAGNOSIS — I059 Rheumatic mitral valve disease, unspecified: Secondary | ICD-10-CM

## 2017-02-25 DIAGNOSIS — I420 Dilated cardiomyopathy: Secondary | ICD-10-CM

## 2017-02-25 LAB — BASIC METABOLIC PANEL
ANION GAP: 6 (ref 5–15)
BUN: 24 mg/dL — ABNORMAL HIGH (ref 6–20)
CHLORIDE: 111 mmol/L (ref 101–111)
CO2: 23 mmol/L (ref 22–32)
Calcium: 9.8 mg/dL (ref 8.9–10.3)
Creatinine, Ser: 1.01 mg/dL — ABNORMAL HIGH (ref 0.44–1.00)
GFR calc non Af Amer: 51 mL/min — ABNORMAL LOW (ref >60)
GFR, EST AFRICAN AMERICAN: 59 mL/min — AB (ref >60)
Glucose, Bld: 109 mg/dL — ABNORMAL HIGH (ref 65–99)
Potassium: 4.5 mmol/L (ref 3.5–5.1)
Sodium: 140 mmol/L (ref 135–145)

## 2017-02-25 LAB — PROTIME-INR
INR: 3.69
Prothrombin Time: 37.6 seconds — ABNORMAL HIGH (ref 11.4–15.2)

## 2017-02-25 SURGERY — CARDIOVERSION
Anesthesia: Monitor Anesthesia Care

## 2017-02-25 MED ORDER — LOSARTAN POTASSIUM 25 MG PO TABS
25.0000 mg | ORAL_TABLET | Freq: Once | ORAL | Status: AC
  Administered 2017-02-25: 25 mg via ORAL
  Filled 2017-02-25: qty 1

## 2017-02-25 MED ORDER — LOSARTAN POTASSIUM 50 MG PO TABS: 50.0000 mg | ORAL_TABLET | Freq: Every day | ORAL | Status: DC

## 2017-02-25 MED ORDER — CARVEDILOL 3.125 MG PO TABS: 3.1250 mg | ORAL_TABLET | Freq: Two times a day (BID) | ORAL | 1 refills | Status: DC

## 2017-02-25 MED ORDER — WARFARIN 0.5 MG HALF TABLET
0.5000 mg | ORAL_TABLET | Freq: Once | ORAL | Status: DC
  Filled 2017-02-25: qty 1

## 2017-02-25 MED ORDER — LOSARTAN POTASSIUM 50 MG PO TABS: 50.0000 mg | ORAL_TABLET | Freq: Every day | ORAL | 1 refills | Status: DC

## 2017-02-25 MED ORDER — SPIRONOLACTONE 25 MG PO TABS: 12.5000 mg | ORAL_TABLET | Freq: Every day | ORAL | Status: DC

## 2017-02-25 MED ORDER — SPIRONOLACTONE 25 MG PO TABS
12.5000 mg | ORAL_TABLET | Freq: Every day | ORAL | Status: DC
  Administered 2017-02-25: 12.5 mg via ORAL
  Filled 2017-02-25: qty 1

## 2017-02-25 MED ORDER — SPIRONOLACTONE 25 MG PO TABS: 12.5000 mg | ORAL_TABLET | Freq: Every day | ORAL | 1 refills | Status: DC

## 2017-02-25 MED ORDER — SOTALOL HCL 80 MG PO TABS: 80.0000 mg | ORAL_TABLET | Freq: Two times a day (BID) | ORAL | 1 refills | Status: DC

## 2017-02-25 NOTE — Progress Notes (Signed)
EKG complete as ordered. SRP, RN

## 2017-02-25 NOTE — Discharge Instructions (Signed)
You will need lab work in Monday to check kidney function in Dr. Theodosia Blender office  After coumadin clinic appt.   echo in 2 months to recheck how strong your heart is. July 03/2017 at 1000  If you gain more than 3 lbs in a day or 5 lbs in a week please call Dr. Theodosia Blender office  Weigh daily   Decrease salt in your diet

## 2017-02-25 NOTE — Progress Notes (Signed)
EKG with QT corrected from QRS duration 404 ms corrected Qtc 450 ms ok for discharge .  Dr, Radford Pax has reviewed.

## 2017-02-25 NOTE — Progress Notes (Signed)
ANTICOAGULATION CONSULT NOTE - Follow Up Consult  Pharmacy Consult for Warfarin Indication: atrial fibrillation  Allergies  Allergen Reactions  . Tramadol Nausea Only    Patient Measurements: Height: 5\' 4"  (162.6 cm) Weight: 188 lb 7.9 oz (85.5 kg) IBW/kg (Calculated) : 54.7  Vital Signs: Temp: 97.7 F (36.5 C) (05/03 0352) Temp Source: Oral (05/03 0352) BP: 140/75 (05/03 0352) Pulse Rate: 71 (05/03 0352)  Labs:  Recent Labs  02/22/17 1724 02/22/17 2247 02/23/17 0454 02/23/17 1005 02/24/17 0316 02/25/17 0536  HGB 14.4  --  12.9  --   --   --   HCT 43.3  --  41.1  --   --   --   PLT 246  --  214  --   --   --   LABPROT 26.3*  --  26.7*  --  31.4* 37.6*  INR 2.37  --  2.41  --  2.95 3.69  CREATININE 1.18*  --  0.99  --  0.92 1.01*  TROPONINI  --  <0.03 <0.03 <0.03  --   --     Estimated Creatinine Clearance: 46.2 mL/min (A) (by C-G formula based on SCr of 1.01 mg/dL (H)).    Assessment: 83 yr female admitted with AFib with RVR.  PMH significant for AFib on warfarin, HTN, tachybrady syndrome s/p PPM, CKD.  Pharmacy to manage warfarin dosing.  PTA Warfarin regimen: 5mg  daily except 2.5mg  on Thurs & Sun; Last dose reported as taken on 4/29 @ 18:00  Significant events: 02/16/17, she underwent low back injections and had held her coumadin for 5 days prior to that, but has since restarted and INR therapeutic on admission  Today, 02/25/2017: - INR = 3.69 - slightly above goal, 6 sec jump in protime - Hgb, platelets WNL on 5/1 - no drug interactions noted  Goal of Therapy:  INR 2-3   Plan:   Warfarin 0.5 mg PO x 1 tonight.  Daily PT/INR.  Monitor closely for s/sx of bleeding.  Eudelia Bunch, Pharm.D. [phone] 02/25/2017 11:24 AM

## 2017-02-25 NOTE — Progress Notes (Signed)
Progress Note  Patient Name: Molly [name] Date of Encounter: 02/25/2017  Primary Cardiologist: Dr. Radford Pax  Subjective   No complaints, no chest pain and no SOB.   Inpatient Medications    Scheduled Meds: . atorvastatin  10 mg Oral QPM  . carvedilol  3.125 mg Oral BID WC  . dorzolamide  1 drop Right Eye BID  . losartan  25 mg Oral Daily  . pantoprazole  40 mg Oral Daily  . senna  1 tablet Oral BID  . sodium chloride flush  3 mL Intravenous Q12H  . sodium chloride flush  3 mL Intravenous Q12H  . sotalol  80 mg Oral Q12H  . Warfarin - Pharmacist Dosing Inpatient   Does not apply q1800   Continuous Infusions: . sodium chloride Stopped (02/24/17 2149)   PRN Meds: acetaminophen **OR** acetaminophen, LORazepam, ondansetron **OR** ondansetron (ZOFRAN) IV, polyethylene glycol, sodium chloride flush   Vital Signs    Vitals:   02/24/17 1324 02/24/17 1800 02/24/17 2034 02/25/17 0352  BP: 129/82 122/72 111/66 140/75  Pulse: 72 69 72 71  Resp: 20  20 18   Temp: 97.5 F (36.4 C)  97.5 F (36.4 C) 97.7 F (36.5 C)  TempSrc: Oral  Oral Oral  SpO2: 97%  95% 91%  Weight: 188 lb 7.9 oz (85.5 kg)     Height: 5\' 4"  (1.626 m)       Intake/Output Summary (Last 24 hours) at 02/25/17 1034 Last data filed at 02/24/17 2000  Gross per 24 hour  Intake              244 ml  Output                0 ml  Net              244 ml   Filed Weights   02/23/17 0400 02/24/17 0500 02/24/17 1324  Weight: 185 lb 6.5 oz (84.1 kg) 187 lb 9.8 oz (85.1 kg) 188 lb 7.9 oz (85.5 kg)    Telemetry    AV sensing and pacing in SR - Personally Reviewed  ECG    No new from 02/23/17 - Personally Reviewed  Physical Exam   GEN: No acute distress.   Neck: No JVD Cardiac: RRR, + 8-9/3 systolic murmur, rubs, or gallops.  Respiratory: Clear to auscultation bilaterally. GI: Soft, nontender, non-distended  MS: No edema; No deformity. Neuro:  Nonfocal  Psych: Normal affect   Labs     Chemistry Recent Labs Lab 02/23/17 0454 02/24/17 0316 02/25/17 0536  NA 140 141 140  K 4.8 4.6 4.5  CL 108 111 111  CO2 25 23 23   GLUCOSE 108* 107* 109*  BUN 27* 26* 24*  CREATININE 0.99 0.92 1.01*  CALCIUM 10.2 9.8 9.8  PROT 6.2*  --   --   ALBUMIN 3.6  --   --   AST 19  --   --   ALT 13*  --   --   ALKPHOS 43  --   --   BILITOT 0.9  --   --   GFRNONAA 52* 57* 51*  GFRAA >60 >60 59*  ANIONGAP 7 7 6      Hematology Recent Labs Lab 02/22/17 1724 02/23/17 0454  WBC 10.8* 9.5  RBC 4.17 3.90  HGB 14.4 12.9  HCT 43.3 41.1  MCV 103.8* 105.4*  MCH 34.5* 33.1  MCHC 33.3 31.4  RDW 14.2 14.1  PLT 246 214    Cardiac Enzymes  Recent Labs Lab 02/22/17 2247 02/23/17 0454 02/23/17 1005  TROPONINI <0.03 <0.03 <0.03    Recent Labs Lab 02/22/17 1732  TROPIPOC 0.01     BNPNo results for input(s): BNP, PROBNP in the last 168 hours.   DDimer No results for input(s): DDIMER in the last 168 hours.   Radiology    No results found.  Cardiac Studies   2D echo 02/23/2017 Study Conclusions  - Left ventricle: The cavity size was normal. Wall thickness was increased in a pattern of mild LVH. Systolic function was severely reduced. The estimated ejection fraction was in the range of 20% to 25%. Diffuse hypokinesis. - Aortic valve: There was mild regurgitation. - Mitral valve: Calcified annulus. There was severe regurgitation. - Left atrium: The atrium was severely dilated. - Right ventricle: The cavity size was mildly dilated. - Right atrium: The atrium was moderately dilated. - Tricuspid valve: There was moderate regurgitation. - Pulmonary arteries: Systolic pressure was moderately increased. PA peak pressure: 58 mm Hg (S).  Impressions:  - Severe global reduction in LV systolic function; mild LVH;l mild AI; severe MR; biatrial enlargement; mild RVE; moderate TR; moderately elevated pulmonary pressure.   Patient Profile     83 y.o. female with  history of difficult to treat persistent atrial fibrillation. She had been on amio at one point but this was stopped due to a significant tremor. She takes coumadin but this was held a week ago for a back injection but is now back on it with a therapeutic INR. Admitted  With 1 week history of palpitations and fatigue and found to be back in afib with RVR. Started on IV Cardizem gtt at 5mg /hr but still with elevated HR as high as 120's especially with ambulation. Trop neg x 3. She has known mild DCM with EF 40-45% by recent echo in February. She also has tachybrady syndrome and is followed in device clinic for PPM  Assessment & Plan    1. Atrial Fibrillation w/ RVR:  - She has converted to atrial paced rhythm - now off  IV Cardizem  - Continue sotalol and increase to 80mg  BID (she had been taking 80mg  qam and 40mg  qpm). - INR therapeutic at 3.69   2. Chronic Systolic HF/nonischemic DCM - most recent echo 11/2016 with mildly decreased LVEF at 40-45% but repeat echo yesterday shows a significant decline in EF at 20-25% which likely is related to tachycardia induced CM.  She has been in afib for at least a week. - She is net positive 963cc but does not appear volume overloaded on exam.   - Stop Cardizem - renal function stable so Losartan 25mg  added daily. Now increased to 50 mg daily  check BMP as outpt - started Coreg 3.125mg  BID - repeat echo in 2 months to make sure LVF has improved - ok to transfer to tele bed and likely discharge home if stable today  3. Mitral Regurgitation: mild to moderate on echo 11/2016. Repeat echo with severe MR but likely related to annular dilatation from DCM and worsening LVF.  Will repeat echo in 2 months to see if LVF and MR have improved.   4. Tachy-Brady Syndrome:has PPM, followed by Dr. [name].   5. Chronic Systemic Anticoagulation:on coumadin for PAF. INR therapeutic at 3.69. Was temporarily held last week for low backinjection.   6. HTN:  controlled.    Signed, Cecilie Kicks, NP  02/25/2017, 10:34 AM

## 2017-02-25 NOTE — Progress Notes (Signed)
PT Cancellation Note  Patient Details Name: Molly [name] MRN: [id number] DOB: 1933/09/07   Cancelled Treatment:    Reason Eval/Treat Not Completed: Attempted PT tx session-pt declined. Pt stated she is set to d/c.     Weston Anna, MPT Pager: [phone]

## 2017-02-25 NOTE — Progress Notes (Signed)
OT Note:    Sock aide was exchanged for wider model this am (by OT supervisor). Stopped by to see if pt had any concerns or if she felt she needed to practice anything else. She feels fine with AE and toilet transfer. Will sign off in acute setting but continue to recommend HHOT to help pt reach PLOF.  [place], OTR/L [phone] 02/25/2017

## 2017-02-25 NOTE — Discharge Summary (Addendum)
Physician Discharge Summary  Molly [name] TDD:[id number] DOB: 1933/04/05 DOA: 02/22/2017  PCP: Gara Kroner, MD  Admit date: 02/22/2017 Discharge date: 02/25/2017  Time spent: 35 minutes  Recommendations for Outpatient Follow-up:  Repeat basic metabolic panel to assess electrolytes and renal function Reassess blood pressure and if needed adjust antihypertensive regimen.  Discharge Diagnoses:  Active Problems:   Essential hypertension   Persistent atrial fibrillation (HCC)   Atrial fibrillation with RVR (HCC)   Mitral regurgitation   Aortic insufficiency   DCM (dilated cardiomyopathy) (HCC)   SOB (shortness of breath) chronic renal failure stage 2-3  Discharge Condition: Stable and improved. Patient discharged home with instructions to follow up with cardiology service, electrophysiologist and PCP (see below for appointment details)  Diet recommendation: Heart healthy/low sodium diet  Filed Weights   02/24/17 0500 02/24/17 1324 02/25/17 1240  Weight: 85.1 kg (187 lb 9.8 oz) 85.5 kg (188 lb 7.9 oz) 89.3 kg (196 lb 13.9 oz)    History of present illness:  83 year old female with past medical history significant for paroxysmal atrial fibrillation (chronically on Coumadin for anticoagulation), hypertension, chronic systolic heart failure (last 2-D echo prior to admission 40-45% ejection fraction), tachybradycardia syndrome status post pacemaker implantation, GERD, chronic kidney disease and aortic insufficiency; who presented to the hospital secondary to approximately 5 days of increased fatigue and palpitation. Patient was found to be on atrial fibrillation with RVR.  Hospital Course:  1-A. fib with RVR -Will resume Sotalol 80mg  BID now; also started on coreg. -Will continue coumadin for secondary prevention (INR therapeutic) -ChadsVasc score 4 -Cardiology will set up outpatient follow-up for patient  2-chronic systolic heart failure/nonischemic dilated cardiomyopathy -With  worsening ejection fraction in comparison to echo done in March 2018; currently EF 20-25%; Most likely triggered by tachycardia induced cardiomyopathy. -Patient Has been started on carvedilol, losartan and spironolactone  -Encouraged to follow adequate hydration, low-sodium diet and daily weights -She is status post pacemaker and the plan is to repeat 2-D echo in 2 months for further assessment on her left ventricular function. -Cardiology Was on board and dictated recommendations for further plan of care -Patient condition is compensated at this moment.  3-tachybradycardia syndrome -Status post pacemaker implantation, will continue outpatient follow-up with Dr. [name]  4-hypertension: -Blood pressure is stable And has tolerated new introduction of antihypertensive agents. -Patient advised to follow a low-sodium diet  5-Class 1 obesity -Low calorie diet and exercise has been discussed with patient -Body mass index is 31.22 kg/m.  6-mitral regurgitation: -Appears to be moderate to severe in repeated echo During this admission -Plan is to follow Up With cardiology service after discharge. -Continue good control of her blood pressure, Stable volume and avoid hypotension.  7-physical deconditioning: -Physical therapy has assessed patient during this admission and has recommended home health PT -Case manager has arranged for home health services to assist patient with further rehabilitation and care after discharge.  8-chronic renal failure: stage 2-3 at baseline -stable and at baseline -monitor renal function trend   Procedures:  2-D echo - Left ventricle: The cavity size was normal. Wall thickness was increased in a pattern of mild LVH. Systolic function was severely reduced. The estimated ejection fraction was in the range of 20% to 25%. Diffuse hypokinesis. - Aortic valve: There was mild regurgitation. - Mitral valve: Calcified annulus. There was severe  regurgitation. - Left atrium: The atrium was severely dilated. - Right ventricle: The cavity size was mildly dilated. - Right atrium: The atrium was moderately dilated. -  Tricuspid valve: There was moderate regurgitation. - Pulmonary arteries: Systolic pressure was moderately increased. PA peak pressure: 58 mm Hg (S).  Impressions: - Severe global reduction in LV systolic function; mild LVH;l mild AI; severe MR; biatrial enlargement; mild RVE; moderate TR; moderately elevated pulmonary pressure.  Consultations:  Cardiology service  Discharge Exam: Vitals:   02/24/17 2034 02/25/17 0352  BP: 111/66 140/75  Pulse: 72 71  Resp: 20 18  Temp: 97.5 F (36.4 C) 97.7 F (36.5 C)    General: Afebrile, no chest pain, Denies shortness of breath and has good oxygen saturation on room air. Patient heart rate Has remained Rate control and with atrial paced rhythm.  Cardiovascular: Positive systolic ejection murmur, no rubs, no gallops, rate controlled; no JVDs.  Respiratory: Good air appointment, no crackles, no wheezing, no using accessory muscles.  Abdomen: Soft, nontender, nondistended, positive bowel sounds  Musculoskeletal: Trace edema bilaterally, no cyanosis, no clubbing   Discharge Instructions   Discharge Instructions    (HEART FAILURE PATIENTS) Call MD:  Anytime you have any of the following symptoms: 1) 3 pound weight gain in 24 hours or 5 pounds in 1 week 2) shortness of breath, with or without a dry hacking cough 3) swelling in the hands, feet or stomach 4) if you have to sleep on extra pillows at night in order to breathe.    Complete by:  As directed    Diet - low sodium heart healthy    Complete by:  As directed    Discharge instructions    Complete by:  As directed    Take medications as prescribed Follow low sodium diet (less than 2 g of sodium per day) Please check your weight on daily basis Follow-up with cardiology service as instructed (4 INR check,  follow-up from heart failure standpoint with Dr. Radford Pax and also follow-up with Dr. [name] for your pacemaker standpoint). Keep herself adequately hydrated     Current Discharge Medication List    START taking these medications   Details  carvedilol (COREG) 3.125 MG tablet Take 1 tablet (3.125 mg total) by mouth 2 (two) times daily with a meal. Qty: 60 tablet, Refills: 1    losartan (COZAAR) 50 MG tablet Take 1 tablet (50 mg total) by mouth daily. Qty: 30 tablet, Refills: 1    spironolactone (ALDACTONE) 25 MG tablet Take 0.5 tablets (12.5 mg total) by mouth daily. Qty: 30 tablet, Refills: 1      CONTINUE these medications which have CHANGED   Details  sotalol (BETAPACE) 80 MG tablet Take 1 tablet (80 mg total) by mouth 2 (two) times daily. Qty: 60 tablet, Refills: 1   Associated Diagnoses: Paroxysmal atrial fibrillation ([place])      CONTINUE these medications which have NOT CHANGED   Details  acetaminophen (TYLENOL) 500 MG tablet Take 1,000 mg by mouth every 6 (six) hours as needed. For pain    atorvastatin (LIPITOR) 10 MG tablet Take 10 mg by mouth every evening.    dorzolamide (TRUSOPT) 2 % ophthalmic solution Place 1 drop into the right eye 2 (two) times daily.    Multiple Vitamin (MULTIVITAMIN WITH MINERALS) TABS Take 1 tablet by mouth daily.    omeprazole (PRILOSEC) 20 MG capsule Take 20 mg by mouth daily.    warfarin (COUMADIN) 5 MG tablet TAKE 1/2-1 TABLET DAILY AS DIRECTED BY ANTICOAGULATION CLINIC Qty: 30 tablet, Refills: 3      STOP taking these medications     furosemide (LASIX) 20 MG tablet  Allergies  Allergen Reactions  . Tramadol Nausea Only   Follow-up Information    [name], MD Follow up on 03/24/2017.   Specialty:  Cardiology Why:  as previously sceduled Contact information: [place]. [place] [address] [phone]        [place] [facility] Follow up on 03/01/2017.   Specialty:   Cardiology Why:  at 9:00 AM for coumadin clinic Contact information: [address] [place]       Ermalinda Barrios, PA-C Follow up on 03/11/2017.   Specialty:  Cardiology Why:  at 1:15 PM this is Dr. Theodosia Blender PA Contact information: [address] [address] [place] [address] [phone]        Gara Kroner, MD. Schedule an appointment as soon as possible for a visit in 2 week(s).   Specialty:  Family Medicine Contact information: [facility] [place] [address] [phone]           The results of significant diagnostics from this hospitalization (including imaging, microbiology, ancillary and laboratory) are listed below for reference.    Significant Diagnostic Studies: Dg Chest 2 View  Result Date: 02/22/2017 CLINICAL DATA:  Abnormal EKG, atrial fibrillation EXAM: CHEST  2 VIEW COMPARISON:  06/08/2014 FINDINGS: Left-sided duo lead pacing device, similar compared to prior. Slightly elevated right diaphragm as before. No acute infiltrate or effusion. Stable cardiomediastinal silhouette with prominent mitral calcification. No pneumothorax. Scoliosis and degenerative changes of the spine. IMPRESSION: No active cardiopulmonary disease. Electronically Signed   By: Donavan Foil M.D.   On: 02/22/2017 17:57   Ct Head Wo Contrast  Result Date: 02/22/2017 CLINICAL DATA:  Severe headache. EXAM: CT HEAD WITHOUT CONTRAST TECHNIQUE: Contiguous axial images were obtained from the base of the skull through the vertex without intravenous contrast. COMPARISON:  08/15/2015 FINDINGS: Brain: There is no evidence for acute hemorrhage, hydrocephalus, mass lesion, or abnormal extra-axial fluid collection. No definite CT evidence for acute infarction. Diffuse loss of parenchymal volume is consistent with atrophy. Patchy low attenuation in the deep hemispheric and periventricular white matter is nonspecific, but likely  reflects chronic microvascular ischemic demyelination. Vascular: No hyperdense vessel or unexpected calcification. Skull: No evidence for fracture. No worrisome lytic or sclerotic lesion. Sinuses/Orbits: The visualized paranasal sinuses and mastoid air cells are clear. Visualized portions of the globes and intraorbital fat are unremarkable. Other: None. IMPRESSION: 1. Stable.  No acute intracranial abnormality. 2. Atrophy with chronic small vessel white matter disease. Electronically Signed   By: Misty Stanley M.D.   On: 02/22/2017 17:53    Microbiology: Recent Results (from the past 240 hour(s))  MRSA PCR Screening     Status: None   Collection Time: 02/22/17 10:10 PM  Result Value Ref Range Status   MRSA by PCR NEGATIVE NEGATIVE Final    Comment:        The GeneXpert MRSA Assay (FDA approved for NASAL specimens only), is one component of a comprehensive MRSA colonization surveillance program. It is not intended to diagnose MRSA infection nor to guide or monitor treatment for MRSA infections.      Labs: Basic Metabolic Panel:  Recent Labs Lab 02/22/17 1724 02/23/17 0454 02/24/17 0316 02/25/17 0536  NA 138 140 141 140  K 4.5 4.8 4.6 4.5  CL 107 108 111 111  CO2 22 25 23 23   GLUCOSE 131* 108* 107* 109*  BUN 32* 27* 26* 24*  CREATININE 1.18*  0.99 0.92 1.01*  CALCIUM 10.5* 10.2 9.8 9.8  MG  --  1.9  --   --   PHOS  --  3.2  --   --    Liver Function Tests:  Recent Labs Lab 02/23/17 0454  AST 19  ALT 13*  ALKPHOS 43  BILITOT 0.9  PROT 6.2*  ALBUMIN 3.6   CBC:  Recent Labs Lab 02/22/17 1724 02/23/17 0454  WBC 10.8* 9.5  HGB 14.4 12.9  HCT 43.3 41.1  MCV 103.8* 105.4*  PLT 246 214   Cardiac Enzymes:  Recent Labs Lab 02/22/17 2247 02/23/17 0454 02/23/17 1005  TROPONINI <0.03 <0.03 <0.03    Signed:  Barton Dubois MD.  [facility] 02/25/2017, 2:44 PM

## 2017-02-25 NOTE — Care Management Note (Signed)
Case Management Note  Patient Details  Name: Molly [name] MRN: [id number] Date of Birth: 18-Oct-1933  Subjective/Objective:                    Action/Plan:plan to discharge home with [place]   Expected Discharge Date:   (unknown)               Expected Discharge Plan:  Molly [name]  In-House Referral:     Discharge planning Services     Post Acute Care Choice:    Choice offered to:  Patient  DME Arranged:  Molly [name], Bedside commode DME Agency:  [employer]:  RN, PT, OT, Nurse's Aide [facility] Agency:  [place]  Status of Service:  In process, will continue to follow  If discussed at Long Length of Stay Meetings, dates discussed:    Additional CommentsPurcell Mouton, RN 02/25/2017, 11:42 AM

## 2017-02-27 ENCOUNTER — Other Ambulatory Visit: Payer: Self-pay | Admitting: Cardiology

## 2017-03-01 ENCOUNTER — Other Ambulatory Visit (INDEPENDENT_AMBULATORY_CARE_PROVIDER_SITE_OTHER): Payer: Medicare Other

## 2017-03-01 ENCOUNTER — Ambulatory Visit (INDEPENDENT_AMBULATORY_CARE_PROVIDER_SITE_OTHER): Payer: Medicare Other

## 2017-03-01 DIAGNOSIS — I5022 Chronic systolic (congestive) heart failure: Secondary | ICD-10-CM

## 2017-03-01 DIAGNOSIS — I420 Dilated cardiomyopathy: Secondary | ICD-10-CM

## 2017-03-01 DIAGNOSIS — I4891 Unspecified atrial fibrillation: Secondary | ICD-10-CM

## 2017-03-01 DIAGNOSIS — I4819 Other persistent atrial fibrillation: Secondary | ICD-10-CM

## 2017-03-01 DIAGNOSIS — Z5181 Encounter for therapeutic drug level monitoring: Secondary | ICD-10-CM

## 2017-03-01 DIAGNOSIS — N183 Chronic kidney disease, stage 3 unspecified: Secondary | ICD-10-CM

## 2017-03-01 DIAGNOSIS — I481 Persistent atrial fibrillation: Secondary | ICD-10-CM

## 2017-03-01 LAB — POCT INR: INR: 4.3

## 2017-03-02 LAB — BASIC METABOLIC PANEL
BUN/Creatinine Ratio: 20 (ref 12–28)
BUN: 20 mg/dL (ref 8–27)
CALCIUM: 10.9 mg/dL — AB (ref 8.7–10.3)
CO2: 22 mmol/L (ref 18–29)
Chloride: 102 mmol/L (ref 96–106)
Creatinine, Ser: 1.02 mg/dL — ABNORMAL HIGH (ref 0.57–1.00)
GFR, EST AFRICAN AMERICAN: 60 mL/min/{1.73_m2} (ref >59)
GFR, EST NON AFRICAN AMERICAN: 52 mL/min/{1.73_m2} — AB (ref >59)
Glucose: 78 mg/dL (ref 65–99)
POTASSIUM: 4.9 mmol/L (ref 3.5–5.2)
Sodium: 139 mmol/L (ref 134–144)

## 2017-03-08 DIAGNOSIS — L03031 Cellulitis of right toe: Secondary | ICD-10-CM | POA: Diagnosis not present

## 2017-03-08 DIAGNOSIS — M2041 Other hammer toe(s) (acquired), right foot: Secondary | ICD-10-CM | POA: Diagnosis not present

## 2017-03-11 ENCOUNTER — Ambulatory Visit (INDEPENDENT_AMBULATORY_CARE_PROVIDER_SITE_OTHER): Payer: Medicare Other | Admitting: Physician Assistant

## 2017-03-11 ENCOUNTER — Ambulatory Visit (INDEPENDENT_AMBULATORY_CARE_PROVIDER_SITE_OTHER): Payer: Medicare Other | Admitting: *Deleted

## 2017-03-11 ENCOUNTER — Encounter: Payer: Self-pay | Admitting: Physician Assistant

## 2017-03-11 VITALS — BP 118/68 | HR 72 | Ht 65.0 in | Wt 185.1 lb

## 2017-03-11 DIAGNOSIS — I420 Dilated cardiomyopathy: Secondary | ICD-10-CM

## 2017-03-11 DIAGNOSIS — I481 Persistent atrial fibrillation: Secondary | ICD-10-CM

## 2017-03-11 DIAGNOSIS — I4891 Unspecified atrial fibrillation: Secondary | ICD-10-CM | POA: Diagnosis not present

## 2017-03-11 DIAGNOSIS — I10 Essential (primary) hypertension: Secondary | ICD-10-CM | POA: Diagnosis not present

## 2017-03-11 DIAGNOSIS — I340 Nonrheumatic mitral (valve) insufficiency: Secondary | ICD-10-CM

## 2017-03-11 DIAGNOSIS — I4819 Other persistent atrial fibrillation: Secondary | ICD-10-CM

## 2017-03-11 DIAGNOSIS — I495 Sick sinus syndrome: Secondary | ICD-10-CM

## 2017-03-11 DIAGNOSIS — Z5181 Encounter for therapeutic drug level monitoring: Secondary | ICD-10-CM

## 2017-03-11 LAB — POCT INR: INR: 4.1

## 2017-03-11 MED ORDER — CARVEDILOL 6.25 MG PO TABS: 6.2500 mg | ORAL_TABLET | Freq: Two times a day (BID) | ORAL | 6 refills | Status: DC

## 2017-03-11 NOTE — Progress Notes (Signed)
Cardiology Office Note    Date:  03/11/2017   ID:  Molly [name], DOB 1933/02/11, MRN [id number]  PCP:  Antony Contras, MD  Cardiologist: Dr. Radford Pax EPS Dr. [name]  Chief Complaint  Patient presents with  . Follow-up    History of Present Illness:  Molly [name] is a 83 y.o. female with history of difficult to treat persistent atrial fibrillation on Coumadin. She was admitted to the hospital with atrial flutter with RVR and started on IV Cardizem. Troponins negative 3. She also has dilated cardiomyopathy EF 40-45% on echo 11/2016. Repeat echo EF 20-25% felt likely related to tachycardia induced cardiomyopathy, also showed severe MR but likely related to annular dilatation from DCM a worsening LVEF.Marland Kitchen Sotalol was increased to 80 mg BID. Losartan increased to 50 mg daily. Coreg started with plans for repeat echo in 2 months. Has tachybradycardia syndrome with permanent pacemaker followed by Dr. [name].  Patient comes in today overall feeling pretty well. She denies any chest pain, palpitations, dyspnea, dyspnea on exertion, or edema. She has chronic fatigue and tires easily. She says her heart rates have been in the 70s.    Past Medical History:  Diagnosis Date  . Aortic insufficiency    mild by echo 2017  . Cancer ([place])    Skin cancer- basal.  1 mylenoma  . Complication of anesthesia   . DCM (dilated cardiomyopathy) ([place])    EF 40-45% by echo 2017 - nuclear stress test with no ischemia  . GERD (gastroesophageal reflux disease)   . H/O benign essential tremor    on amiodarone resolved off amio  . H/O hyperkalemia    Resolved off ACE inhibitors  . High cholesterol   . History of blood transfusion   . Hyperparathyroidism ([place])   . Hypertension   . Mitral regurgitation    mild to moderate by echo 2017  . Persistent atrial fibrillation ([place])   . PONV (postoperative nausea and vomiting)   . PVC's (premature ventricular contractions)   . Renal insufficiency    MILD  .  Shortness of breath    with exertion  . Tachycardia-bradycardia syndrome [facility])    s/p PPM 02/2013    Past Surgical History:  Procedure Laterality Date  . AMPUTATION Right 11/27/2014   Procedure: RIGHT 2ND AND 3RD TOE AMPUTATION;  Surgeon: Wylene Simmer, MD;  Location: [place];  Service: Orthopedics;  Laterality: Right;  . BUNIONECTOMY     Right  . CARDIAC CATHETERIZATION  09/25/2000   EF of 50% -- with normal left ventricular size and function  . CESAREAN SECTION    . CESAREAN SECTION    . EYE SURGERY Bilateral    Cataract  . FOOT SURGERY    . INSERT / REPLACE / REMOVE PACEMAKER  02/2013  . PACEMAKER INSERTION  02/2013  . PERMANENT PACEMAKER INSERTION N/A 03/02/2013   Procedure: PERMANENT PACEMAKER INSERTION;  Surgeon: Evans Lance, MD;  Location: [facility] CATH LAB;  Service: Cardiovascular;  Laterality: N/A;  . SHOULDER ARTHROSCOPY W/ ROTATOR CUFF REPAIR    . SHOULDER SURGERY Right    roto cuff  . TOE AMPUTATION Left    2nd and 3rd, bunion under toes.  . TONSILLECTOMY    . VARICOSE VEIN SURGERY      Current Medications: Outpatient Medications Prior to Visit  Medication Sig Dispense Refill  . acetaminophen (TYLENOL) 500 MG tablet Take 1,000 mg by mouth every 6 (six) hours as needed. For pain    . atorvastatin (  LIPITOR) 10 MG tablet Take 10 mg by mouth every evening.    . dorzolamide (TRUSOPT) 2 % ophthalmic solution Place 1 drop into the right eye 2 (two) times daily.    Marland Kitchen losartan (COZAAR) 50 MG tablet Take 1 tablet (50 mg total) by mouth daily. 30 tablet 1  . Multiple Vitamin (MULTIVITAMIN WITH MINERALS) TABS Take 1 tablet by mouth daily.    Marland Kitchen omeprazole (PRILOSEC) 20 MG capsule Take 20 mg by mouth daily.    . sotalol (BETAPACE) 80 MG tablet Take 1 tablet (80 mg total) by mouth 2 (two) times daily. 60 tablet 1  . spironolactone (ALDACTONE) 25 MG tablet Take 0.5 tablets (12.5 mg total) by mouth daily. 30 tablet 1  . warfarin (COUMADIN) 5 MG tablet TAKE 1/2 TO 1 TABLET DAILY AS DIRECTED  BY ANTICOAGULATION CLINIC 30 tablet 3  . carvedilol (COREG) 3.125 MG tablet Take 1 tablet (3.125 mg total) by mouth 2 (two) times daily with a meal. 60 tablet 1   No facility-administered medications prior to visit.      Allergies:   Tramadol   Social History   Social History  . Marital status: Married    Spouse name: N/A  . Number of children: N/A  . Years of education: N/A   Social History Main Topics  . Smoking status: Never Smoker  . Smokeless tobacco: Never Used  . Alcohol use No  . Drug use: No  . Sexual activity: Not on file   Other Topics Concern  . Not on file   Social History Narrative  . No narrative on file     Family History:  The patient's   family history includes Hypertension in her father and mother.   ROS:   Please see the history of present illness.    Review of Systems  Constitution: Negative.  HENT: Negative.   Eyes: Negative.   Cardiovascular: Positive for dyspnea on exertion, irregular heartbeat and palpitations.  Respiratory: Negative.   Hematologic/Lymphatic: Bruises/bleeds easily.  Musculoskeletal: Positive for back pain. Negative for joint pain.  Gastrointestinal: Negative.   Genitourinary: Negative.   Neurological: Positive for dizziness and loss of balance.   All other systems reviewed and are negative.   PHYSICAL EXAM:   VS:  BP 118/68   Pulse 72   Ht 5\' 5"  (1.651 m)   Wt 185 lb 1.9 oz (84 kg)   SpO2 97%   BMI 30.81 kg/m   Physical Exam  GEN: Obese, in no acute distress  Neck: no JVD, carotid bruits, or masses Cardiac:RRR; 1/6 systolic murmur at the apex  Respiratory:  clear to auscultation bilaterally, normal work of breathing GI: soft, nontender, nondistended, + BS Ext: Trace of ankle edema without cyanosis, clubbing, Good distal pulses bilaterally Neuro:  Alert and Oriented x 3, Strength and sensation are intact Psych: euthymic mood, full affect  Wt Readings from Last 3 Encounters:  03/11/17 185 lb 1.9 oz (84 kg)    02/25/17 196 lb 13.9 oz (89.3 kg)  11/10/16 188 lb (85.3 kg)      Studies/Labs Reviewed:   EKG:  EKG is  ordered today.  The ekg ordered today demonstrates Normal sinus rhythm with PVC LVH QT/QTc 386/419 ms  Recent Labs: 02/23/2017: ALT 13; Hemoglobin 12.9; Magnesium 1.9; Platelets 214; TSH 3.115 03/01/2017: BUN 20; Creatinine, Ser 1.02; Potassium 4.9; Sodium 139   Lipid Panel    Component Value Date/Time   CHOL 156 05/23/2012 0514   TRIG 86 05/23/2012 0514  HDL 80 05/23/2012 0514   CHOLHDL 2.0 05/23/2012 0514   VLDL 17 05/23/2012 0514   LDLCALC 59 05/23/2012 0514    Additional studies/ records that were reviewed today include:  2D echo 02/23/2017 Study Conclusions   - Left ventricle: The cavity size was normal. Wall thickness was   increased in a pattern of mild LVH. Systolic function was   severely reduced. The estimated ejection fraction was in the   range of 20% to 25%. Diffuse hypokinesis. - Aortic valve: There was mild regurgitation. - Mitral valve: Calcified annulus. There was severe regurgitation. - Left atrium: The atrium was severely dilated. - Right ventricle: The cavity size was mildly dilated. - Right atrium: The atrium was moderately dilated. - Tricuspid valve: There was moderate regurgitation. - Pulmonary arteries: Systolic pressure was moderately increased.   PA peak pressure: 58 mm Hg (S).   Impressions:   - Severe global reduction in LV systolic function; mild LVH;l mild   AI; severe MR; biatrial enlargement; mild RVE; moderate TR;   moderately elevated pulmonary pressure.         ASSESSMENT:    1. Persistent atrial fibrillation ([place])   2. DCM (dilated cardiomyopathy) ([place])   3. Tachycardia-bradycardia syndrome ([place])   4. Mitral valve insufficiency, unspecified etiology   5. Essential hypertension      PLAN:  In order of problems listed above:  Persistent difficult to control atrial fibrillation with recent admission with RVR. Patient  now normal sinus rhythm on increased sotalol 80 mg twice a day. Continue this dose and follow-up with Dr. [name] as schedule at the end of this month. Patient is on Coumadin.  Dilated cardiomyopathy with worsening LVEF 20-25% most likely related to tachycardia induced cardiomyopathy. Patient is not in volume overload. Cardizem was stopped in the hospital and Coreg started. Will increase this to 6.25 mg twice a day. Repeat echo scheduled in a couple weeks.  Tachybradycardia syndrome with permanent pacemaker followed by Dr. [name]  Mitral regurgitation looks severe on recent echo when in rapid atrial fibrillation. MR murmur is not severe on exam. Hopefully follow-up echo confirmed this.  Essential hypertension controlled    Medication Adjustments/Labs and Tests Ordered: Current medicines are reviewed at length with the patient today.  Concerns regarding medicines are outlined above.  Medication changes, Labs and Tests ordered today are listed in the Patient Instructions below. There are no Patient Instructions on file for this visit.   Signed, Ermalinda Barrios, PA-C  03/11/2017 1:43 PM    [facility] [address], [place], [place]  [address] Phone: [phone]; Fax: [phone]

## 2017-03-11 NOTE — Patient Instructions (Addendum)
Medication Instructions:  Your physician has recommended you make the following change in your medication:  1. Increase Coreg (6.25 mg ) twice daily.    Labwork: -None  Testing/Procedures: -None  Follow-Up: Your physician recommends that you keep all your scheduled  follow-up appointments with Dr. [name], Estella Husk, PA, and Echo.   Any Other Special Instructions Will Be Listed Below (If Applicable).     If you need a refill on your cardiac medications before your next appointment, please call your pharmacy.

## 2017-03-23 ENCOUNTER — Telehealth: Payer: Self-pay | Admitting: Cardiology

## 2017-03-23 NOTE — Telephone Encounter (Signed)
New message   Pt c/o medication issue:  1. Name of Medication: carvedilol (COREG) 6.25 MG tablet  2. How are you currently taking this medication (dosage and times per day)? 6.25mg   3. Are you having a reaction (difficulty breathing--STAT)? no  4. What is your medication issue? Dizziness, and having a hard time walking - pt believes it is due to the medication that was changed

## 2017-03-23 NOTE — Telephone Encounter (Signed)
Patient states since increasing Coreg she has felt awful and more tired every day.  She states she has no energy and doesn't want to leave the house. She states she felt better on the lower dose of Coreg, but "didn't feel fantastic on the lower dose either." She checks her blood pressure several times daily and says her BP fluctuates between 90s/60s and 130s/80s. Her HR is usually in the 70s, but one day when she felt poorly recently she checked her HR and it was 43. This is the lowest reading she has seen.  She is seeing Dr. [name] tomorrow, but requests medication recommendations today.

## 2017-03-23 NOTE — Telephone Encounter (Signed)
Instructed patient to decrease Coreg to 3.125 mg tonight and tomorrow (after checking HR) and Dr. [name] will discuss future plans tomorrow at visit. She agrees with treatment plan and was grateful for call.

## 2017-03-23 NOTE — Telephone Encounter (Signed)
Would recommend that pt reduce her carvedilol dose back to 3.125mg  for this evening and tomorrow morning's dose and f/u with Dr [name] as scheduled in clinic tomorrow afternoon for further recommendations.

## 2017-03-24 ENCOUNTER — Ambulatory Visit (INDEPENDENT_AMBULATORY_CARE_PROVIDER_SITE_OTHER): Payer: Medicare Other | Admitting: Internal Medicine

## 2017-03-24 ENCOUNTER — Ambulatory Visit (INDEPENDENT_AMBULATORY_CARE_PROVIDER_SITE_OTHER): Payer: Medicare Other | Admitting: *Deleted

## 2017-03-24 ENCOUNTER — Encounter: Payer: Self-pay | Admitting: Internal Medicine

## 2017-03-24 VITALS — BP 126/80 | HR 72 | Ht 65.0 in | Wt 186.0 lb

## 2017-03-24 DIAGNOSIS — Z950 Presence of cardiac pacemaker: Secondary | ICD-10-CM

## 2017-03-24 DIAGNOSIS — I4819 Other persistent atrial fibrillation: Secondary | ICD-10-CM

## 2017-03-24 DIAGNOSIS — I495 Sick sinus syndrome: Secondary | ICD-10-CM | POA: Diagnosis not present

## 2017-03-24 DIAGNOSIS — I4891 Unspecified atrial fibrillation: Secondary | ICD-10-CM | POA: Diagnosis not present

## 2017-03-24 DIAGNOSIS — I481 Persistent atrial fibrillation: Secondary | ICD-10-CM

## 2017-03-24 DIAGNOSIS — Z5181 Encounter for therapeutic drug level monitoring: Secondary | ICD-10-CM

## 2017-03-24 LAB — CUP PACEART INCLINIC DEVICE CHECK
Brady Statistic RV Percent Paced: 12 %
Date Time Interrogation Session: 20180530040000
Implantable Lead Implant Date: 20140508
Implantable Lead Implant Date: 20140508
Implantable Lead Location: 753859
Implantable Lead Location: 753860
Implantable Lead Model: 4136
Implantable Lead Serial Number: 29333020
Implantable Pulse Generator Implant Date: 20140508
Lead Channel Impedance Value: 668 Ohm
Lead Channel Impedance Value: 693 Ohm
Lead Channel Pacing Threshold Amplitude: 1 V
Lead Channel Pacing Threshold Pulse Width: 0.4 ms
Lead Channel Pacing Threshold Pulse Width: 0.4 ms
Lead Channel Sensing Intrinsic Amplitude: 1.9 mV
Lead Channel Sensing Intrinsic Amplitude: 15.9 mV
Lead Channel Setting Pacing Amplitude: 2 V
Lead Channel Setting Pacing Amplitude: 2.4 V
Lead Channel Setting Sensing Sensitivity: 2.5 mV
MDC IDC LEAD SERIAL: 29379474
MDC IDC MSMT LEADCHNL RV PACING THRESHOLD AMPLITUDE: 0.6 V
MDC IDC SET LEADCHNL RV PACING PULSEWIDTH: 0.4 ms
MDC IDC STAT BRADY RA PERCENT PACED: 81 %
Pulse Gen Serial Number: 112392

## 2017-03-24 LAB — POCT INR: INR: 2.9

## 2017-03-24 NOTE — Progress Notes (Signed)
HPI [name] returns today for followup. She is a very pleasant 83 year old woman with a history of frequent PVCs, and atrial fibrillation. In the interim, she has done well. She was bothered by too much coreg and felt weak and tired but had the dose decreased and already is feeling better. She still has palpitations but for the most part these are controlled.  Allergies  Allergen Reactions  . Tramadol Nausea Only     Current Outpatient Prescriptions  Medication Sig Dispense Refill  . acetaminophen (TYLENOL) 500 MG tablet Take 1,000 mg by mouth every 6 (six) hours as needed. For pain    . atorvastatin (LIPITOR) 10 MG tablet Take 10 mg by mouth every evening.    . carvedilol (COREG) 6.25 MG tablet Take 1 tablet (6.25 mg total) by mouth 2 (two) times daily with a meal. 30 tablet 6  . dorzolamide (TRUSOPT) 2 % ophthalmic solution Place 1 drop into the right eye 2 (two) times daily.    Marland Kitchen losartan (COZAAR) 50 MG tablet Take 1 tablet (50 mg total) by mouth daily. 30 tablet 1  . Multiple Vitamin (MULTIVITAMIN WITH MINERALS) TABS Take 1 tablet by mouth daily.    . NUCYNTA 50 MG tablet Take 50 mg by mouth every 6 (six) hours as needed (for pain).     Marland Kitchen omeprazole (PRILOSEC) 20 MG capsule Take 20 mg by mouth daily.    . sotalol (BETAPACE) 80 MG tablet Take 1 tablet (80 mg total) by mouth 2 (two) times daily. 60 tablet 1  . spironolactone (ALDACTONE) 25 MG tablet Take 0.5 tablets (12.5 mg total) by mouth daily. 30 tablet 1  . warfarin (COUMADIN) 5 MG tablet TAKE 1/2 TO 1 TABLET DAILY AS DIRECTED BY ANTICOAGULATION CLINIC 30 tablet 3   No current facility-administered medications for this visit.      Past Medical History:  Diagnosis Date  . Aortic insufficiency    mild by echo 2017  . Cancer ([place])    Skin cancer- basal.  1 mylenoma  . Complication of anesthesia   . DCM (dilated cardiomyopathy) ([place])    EF 40-45% by echo 2017 - nuclear stress test with no ischemia  . GERD (gastroesophageal  reflux disease)   . H/O benign essential tremor    on amiodarone resolved off amio  . H/O hyperkalemia    Resolved off ACE inhibitors  . High cholesterol   . History of blood transfusion   . Hyperparathyroidism ([place])   . Hypertension   . Mitral regurgitation    mild to moderate by echo 2017  . Persistent atrial fibrillation ([place])   . PONV (postoperative nausea and vomiting)   . PVC's (premature ventricular contractions)   . Renal insufficiency    MILD  . Shortness of breath    with exertion  . Tachycardia-bradycardia syndrome ([place])    s/p PPM 02/2013    ROS:   All systems reviewed and negative except as noted in the HPI.   Past Surgical History:  Procedure Laterality Date  . AMPUTATION Right 11/27/2014   Procedure: RIGHT 2ND AND 3RD TOE AMPUTATION;  Surgeon: Wylene Simmer, MD;  Location: [facility];  Service: Orthopedics;  Laterality: Right;  . BUNIONECTOMY     Right  . CARDIAC CATHETERIZATION  09/25/2000   EF of 50% -- with normal left ventricular size and function  . CESAREAN SECTION    . CESAREAN SECTION    . EYE SURGERY Bilateral    Cataract  . FOOT SURGERY    .  INSERT / REPLACE / REMOVE PACEMAKER  02/2013  . PACEMAKER INSERTION  02/2013  . PERMANENT PACEMAKER INSERTION N/A 03/02/2013   Procedure: PERMANENT PACEMAKER INSERTION;  Surgeon: Evans Lance, MD;  Location: [facility] CATH LAB;  Service: Cardiovascular;  Laterality: N/A;  . SHOULDER ARTHROSCOPY W/ ROTATOR CUFF REPAIR    . SHOULDER SURGERY Right    roto cuff  . TOE AMPUTATION Left    2nd and 3rd, bunion under toes.  . TONSILLECTOMY    . VARICOSE VEIN SURGERY       Family History  Problem Relation Age of Onset  . Hypertension Mother   . Hypertension Father      Social History   Social History  . Marital status: Married    Spouse name: N/A  . Number of children: N/A  . Years of education: N/A   Occupational History  . Not on file.   Social History Main Topics  . Smoking status: Never Smoker  . Smokeless  tobacco: Never Used  . Alcohol use No  . Drug use: No  . Sexual activity: Not on file   Other Topics Concern  . Not on file   Social History Narrative  . No narrative on file     BP 126/80   Pulse 72   Ht 5\' 5"  (1.651 m)   Wt 186 lb (84.4 kg)   SpO2 98%   BMI 30.95 kg/m   Physical Exam:  Well appearing 83 year old woman,NAD HEENT: Unremarkable Neck:  6 cm JVD, no thyromegally Back:  No CVA tenderness Lungs:  Clear with no wheezes, rales or rhonchi HEART:  Regular rate rhythm, no murmurs, no rubs, no clicks Abd:  soft, positive bowel sounds, no organomegally, no rebound, no guarding Ext:  2 plus pulses, no edema, no cyanosis, no clubbing Skin:  No rashes no nodules Neuro:  CN II through XII intact, motor grossly intact   DEVICE  Normal device function.  See PaceArt for details.   Assess/Plan: 1. PAF/Flutter - she is mostly in NSR. She will continue her current meds but I have asked her to reduce her dose of sotalol down to 80 mg daily. 2. PM - her [place] DDD PM is working normally.  3. HTN - her blood pressure is under good control. She had been hypotensive. If her pressures continue lower, she could reduce her dose of Losartan. 4. Atrial tachycardia - she has had some break through episodes but has mostly been well controlled.   Mikle Bosworth.D.

## 2017-03-24 NOTE — Patient Instructions (Addendum)
Medication Instructions:  Your physician recommends that you continue on your current medications as directed. Please refer to the Current Medication list given to you today.   Labwork: None Ordered   Testing/Procedures: None Ordered   Follow-Up: Your physician wants you to follow-up in: 1 year with Dr. Taylor. You will receive a reminder letter in the mail two months in advance. If you don't receive a letter, please call our office to schedule the follow-up appointment.  Remote monitoring is used to monitor your Pacemaker from home. This monitoring reduces the number of office visits required to check your device to one time per year. It allows us to keep an eye on the functioning of your device to ensure it is working properly. You are scheduled for a device check from home on  06/23/17 . You may send your transmission at any time that day. If you have a wireless device, the transmission will be sent automatically. After your physician reviews your transmission, you will receive a postcard with your next transmission date.    Any Other Special Instructions Will Be Listed Below (If Applicable).     If you need a refill on your cardiac medications before your next appointment, please call your pharmacy.   

## 2017-04-07 DIAGNOSIS — T8189XA Other complications of procedures, not elsewhere classified, initial encounter: Secondary | ICD-10-CM | POA: Diagnosis not present

## 2017-04-12 DIAGNOSIS — K219 Gastro-esophageal reflux disease without esophagitis: Secondary | ICD-10-CM | POA: Diagnosis not present

## 2017-04-12 DIAGNOSIS — F3340 Major depressive disorder, recurrent, in remission, unspecified: Secondary | ICD-10-CM | POA: Diagnosis not present

## 2017-04-12 DIAGNOSIS — I129 Hypertensive chronic kidney disease with stage 1 through stage 4 chronic kidney disease, or unspecified chronic kidney disease: Secondary | ICD-10-CM | POA: Diagnosis not present

## 2017-04-12 DIAGNOSIS — E782 Mixed hyperlipidemia: Secondary | ICD-10-CM | POA: Diagnosis not present

## 2017-04-12 DIAGNOSIS — R609 Edema, unspecified: Secondary | ICD-10-CM | POA: Diagnosis not present

## 2017-04-12 DIAGNOSIS — F419 Anxiety disorder, unspecified: Secondary | ICD-10-CM | POA: Diagnosis not present

## 2017-04-12 DIAGNOSIS — I4891 Unspecified atrial fibrillation: Secondary | ICD-10-CM | POA: Diagnosis not present

## 2017-04-12 DIAGNOSIS — Z950 Presence of cardiac pacemaker: Secondary | ICD-10-CM | POA: Diagnosis not present

## 2017-04-12 DIAGNOSIS — M545 Low back pain: Secondary | ICD-10-CM | POA: Diagnosis not present

## 2017-04-12 DIAGNOSIS — N183 Chronic kidney disease, stage 3 (moderate): Secondary | ICD-10-CM | POA: Diagnosis not present

## 2017-04-12 DIAGNOSIS — R7303 Prediabetes: Secondary | ICD-10-CM | POA: Diagnosis not present

## 2017-04-13 ENCOUNTER — Ambulatory Visit (INDEPENDENT_AMBULATORY_CARE_PROVIDER_SITE_OTHER): Payer: Medicare Other | Admitting: *Deleted

## 2017-04-13 DIAGNOSIS — I481 Persistent atrial fibrillation: Secondary | ICD-10-CM

## 2017-04-13 DIAGNOSIS — I4891 Unspecified atrial fibrillation: Secondary | ICD-10-CM | POA: Diagnosis not present

## 2017-04-13 DIAGNOSIS — I4819 Other persistent atrial fibrillation: Secondary | ICD-10-CM

## 2017-04-13 DIAGNOSIS — Z5181 Encounter for therapeutic drug level monitoring: Secondary | ICD-10-CM

## 2017-04-13 LAB — POCT INR: INR: 3.1

## 2017-04-15 ENCOUNTER — Telehealth: Payer: Self-pay | Admitting: Cardiology

## 2017-04-15 NOTE — Telephone Encounter (Signed)
New Message    Pt c/o BP issue: STAT if pt c/o blurred vision, one-sided weakness or slurred speech  1. What are your last 5 BP readings? 96/68 heart rate 53,  Then last night her heart rate dropped to 41  2. Are you having any other symptoms (ex. Dizziness, headache, blurred vision, passed out)?  Some dizziness and tired   3. What is your BP issue? Pt states her bp and heart rate was really low

## 2017-04-15 NOTE — Telephone Encounter (Signed)
Please have her come in for a pacer check tomorrow check pacer function and verify HR

## 2017-04-15 NOTE — Telephone Encounter (Signed)
Patient called to complain of fluctuating BP and HR. Molly [name] was a "terrible day." The patient states her BP dropped to 96/63 and her HR was 41. She could barely get out of bed. She was tired and weak and "could barely do nothing." She blames the symptoms on medications she was started on at her most recent hospital stay. She reports she spoke with Dr. [name] about her symptoms and he kept medications the same. He also told her that the HR is not accurate. She states he checked her PM. She did not take her Losartan or her Coreg today and she feels much better. Her BP is 134/92 and her HR was 82. She requests med change recommendations from Dr. Radford Pax.

## 2017-04-16 NOTE — Telephone Encounter (Signed)
Called patient about Dr.Turner's recommendations. I offered patient an appt for today @ 1400. Patient states that she can't come into the office today bc her husband is working. She said that she doesn't believe her problems are related to her ppm. She said she thinks she's having issues with her medications. Patient asked if she could send in a remote transmission and come into the office if there were any abnormal findings. I told her that this would be fine. Will call patient once transmission is received.

## 2017-04-16 NOTE — Telephone Encounter (Signed)
Will forward to [facility] for review and scheduling.

## 2017-04-16 NOTE — Telephone Encounter (Signed)
Spoke to Dr.Turner about ppm results. Dr.Turner asked that Dr.Taylor review the report and make recommendations accordingly.  Informed husband that transmission results had been discussed with Dr.Turner and will be discussed with Dr.Taylor. I told him that we would call him back with recommendations shortly. Husband verbalized understanding.

## 2017-04-16 NOTE — Telephone Encounter (Signed)
Called to inform patient of Dr.Taylor's recommendations. Patient was not home, so I spoke to husband (on [place] form). Informed husband that d/t patient's lack of sx's with the majority of the episodes she has GT does not want to make any changes to her medications at this time. Husband was very upset with the information and stated several times that he "believed his wife was taking too much medication". Husband feels that at least one of her medications needs to be reduced bc "she feels horrible all the time and has changed her lifestyle completely".  When I told husband that she did not relay that information to me this am, he said that she doesn't like to say how bad things really are. Husband said that it may be time for patient to follow up with Dr.Turner to straighten medications out. He said he would let patient know what Dr.Taylor said and see what she decides.

## 2017-04-16 NOTE — Telephone Encounter (Signed)
Remote transmission received. Presenting rhythm: ApVs 90's. 10% AT/AF burden since 03/24/17. (1008) total ventricular high rate episodes, AT per EGMs. Stable histogram distribution. Stable lead measurements. Normal device function.

## 2017-04-16 NOTE — Telephone Encounter (Signed)
Spoke to Dr.Taylor about patient's episodes recorded on her device. Dr.Taylor recommended no changes at this time.

## 2017-04-30 ENCOUNTER — Other Ambulatory Visit: Payer: Self-pay | Admitting: Internal Medicine

## 2017-04-30 ENCOUNTER — Other Ambulatory Visit (HOSPITAL_COMMUNITY): Payer: Medicare Other

## 2017-05-03 ENCOUNTER — Ambulatory Visit (INDEPENDENT_AMBULATORY_CARE_PROVIDER_SITE_OTHER): Payer: Medicare Other | Admitting: *Deleted

## 2017-05-03 DIAGNOSIS — Z5181 Encounter for therapeutic drug level monitoring: Secondary | ICD-10-CM | POA: Diagnosis not present

## 2017-05-03 DIAGNOSIS — I481 Persistent atrial fibrillation: Secondary | ICD-10-CM

## 2017-05-03 DIAGNOSIS — I4819 Other persistent atrial fibrillation: Secondary | ICD-10-CM

## 2017-05-03 DIAGNOSIS — I4891 Unspecified atrial fibrillation: Secondary | ICD-10-CM | POA: Diagnosis not present

## 2017-05-03 LAB — POCT INR: INR: 2.6

## 2017-05-10 ENCOUNTER — Ambulatory Visit (HOSPITAL_COMMUNITY): Payer: Medicare Other | Attending: Cardiovascular Disease

## 2017-05-10 ENCOUNTER — Other Ambulatory Visit: Payer: Self-pay

## 2017-05-10 DIAGNOSIS — I420 Dilated cardiomyopathy: Secondary | ICD-10-CM | POA: Diagnosis not present

## 2017-05-10 DIAGNOSIS — I081 Rheumatic disorders of both mitral and tricuspid valves: Secondary | ICD-10-CM | POA: Insufficient documentation

## 2017-05-14 ENCOUNTER — Encounter: Payer: Self-pay | Admitting: Cardiology

## 2017-05-14 NOTE — Telephone Encounter (Signed)
°  Follow Up   Calling to follow up on most recent echocardiogram results. Please call.

## 2017-05-14 NOTE — Telephone Encounter (Signed)
This encounter was created in error - please disregard.

## 2017-05-17 ENCOUNTER — Ambulatory Visit (INDEPENDENT_AMBULATORY_CARE_PROVIDER_SITE_OTHER): Payer: Medicare Other

## 2017-05-17 ENCOUNTER — Encounter: Payer: Self-pay | Admitting: Physician Assistant

## 2017-05-17 ENCOUNTER — Ambulatory Visit (INDEPENDENT_AMBULATORY_CARE_PROVIDER_SITE_OTHER): Payer: Medicare Other | Admitting: Physician Assistant

## 2017-05-17 VITALS — BP 110/70 | HR 104 | Ht 66.0 in | Wt 188.8 lb

## 2017-05-17 DIAGNOSIS — I4819 Other persistent atrial fibrillation: Secondary | ICD-10-CM

## 2017-05-17 DIAGNOSIS — Z5181 Encounter for therapeutic drug level monitoring: Secondary | ICD-10-CM | POA: Diagnosis not present

## 2017-05-17 DIAGNOSIS — I10 Essential (primary) hypertension: Secondary | ICD-10-CM | POA: Diagnosis not present

## 2017-05-17 DIAGNOSIS — R351 Nocturia: Secondary | ICD-10-CM

## 2017-05-17 DIAGNOSIS — I952 Hypotension due to drugs: Secondary | ICD-10-CM | POA: Diagnosis not present

## 2017-05-17 DIAGNOSIS — I481 Persistent atrial fibrillation: Secondary | ICD-10-CM

## 2017-05-17 DIAGNOSIS — I4891 Unspecified atrial fibrillation: Secondary | ICD-10-CM | POA: Diagnosis not present

## 2017-05-17 DIAGNOSIS — I420 Dilated cardiomyopathy: Secondary | ICD-10-CM

## 2017-05-17 DIAGNOSIS — Z79899 Other long term (current) drug therapy: Secondary | ICD-10-CM

## 2017-05-17 DIAGNOSIS — I959 Hypotension, unspecified: Secondary | ICD-10-CM | POA: Insufficient documentation

## 2017-05-17 LAB — POCT INR: INR: 2.6

## 2017-05-17 MED ORDER — LOSARTAN POTASSIUM 25 MG PO TABS: 25.0000 mg | ORAL_TABLET | Freq: Every day | ORAL | 3 refills | Status: DC

## 2017-05-17 MED ORDER — CARVEDILOL 3.125 MG PO TABS: 3.1250 mg | ORAL_TABLET | Freq: Two times a day (BID) | ORAL | 3 refills | Status: DC

## 2017-05-17 NOTE — Progress Notes (Signed)
Cardiology Office Note    Date:  05/17/2017   ID:  Molly [name], DOB 30-Oct-1932, MRN [id number]  PCP:  Antony Contras, MD  Cardiologist: Dr. Radford Pax EPS Dr. [name]  Chief Complaint  Patient presents with  . Follow-up    History of Present Illness:  Molly [name] is a 83 y.o. female  with history of difficult to treat persistent atrial fibrillation on Coumadin. She was admitted to the hospital with atrial flutter with RVR and started on IV Cardizem. Troponins negative 3. She also has dilated cardiomyopathy EF 40-45% on echo 11/2016. Repeat echo EF 20-25% felt likely related to tachycardia induced cardiomyopathy, also showed severe MR but likely related to annular dilatation from DCM a worsening LVEF.Marland Kitchen Sotalol was increased to 80 mg BID. Losartan increased to 50 mg daily. Coreg started with plans for repeat echo in 2 months. Has tachybradycardia syndrome with permanent pacemaker followed by Dr. [name].  I saw the patient 03/11/17 and tried to titrate her Coreg to 6.25 mg twice a day but she did not tolerate this and is back on 3.125 mg twice a day. She saw Dr. [name] 03/24/17 who asked her to reduce her dose of sotalol down to 80 mg daily. This was eventually increased to 80 mg twice a day by Dr. Radford Pax because of increased heart rates. Repeat 2-D echo showed some improvement in LV function EF 35-40% with diffuse hypokinesis and grade 2 DD with moderate MR.  Patient was added onto my schedule today for the intent of starting entresto. She was told to hold her losartan yesterday. She has been feeling poorly. She says her blood pressures been running low on when it's low she feels bad. She brings a list of blood pressures that range from 87-56 systolic. She is also complaining of urinary frequency at night with some burning. This started last week and she was trying to treat it with cranberry juice. She'd like a urinalysis done today.     Past Medical History:  Diagnosis Date  . Aortic  insufficiency    mild by echo 2017  . Cancer ([place])    Skin cancer- basal.  1 mylenoma  . Complication of anesthesia   . DCM (dilated cardiomyopathy) ([place])    EF 40-45% by echo 2017 - nuclear stress test with no ischemia  . GERD (gastroesophageal reflux disease)   . H/O benign essential tremor    on amiodarone resolved off amio  . H/O hyperkalemia    Resolved off ACE inhibitors  . High cholesterol   . History of blood transfusion   . Hyperparathyroidism ([place])   . Hypertension   . Mitral regurgitation    mild to moderate by echo 2017  . Persistent atrial fibrillation ([place])   . PONV (postoperative nausea and vomiting)   . PVC's (premature ventricular contractions)   . Renal insufficiency    MILD  . Shortness of breath    with exertion  . Tachycardia-bradycardia syndrome [facility])    s/p PPM 02/2013    Past Surgical History:  Procedure Laterality Date  . AMPUTATION Right 11/27/2014   Procedure: RIGHT 2ND AND 3RD TOE AMPUTATION;  Surgeon: Wylene Simmer, MD;  Location: [facility];  Service: Orthopedics;  Laterality: Right;  . BUNIONECTOMY     Right  . CARDIAC CATHETERIZATION  09/25/2000   EF of 50% -- with normal left ventricular size and function  . CESAREAN SECTION    . CESAREAN SECTION    . EYE SURGERY Bilateral  Cataract  . FOOT SURGERY    . INSERT / REPLACE / REMOVE PACEMAKER  02/2013  . PACEMAKER INSERTION  02/2013  . PERMANENT PACEMAKER INSERTION N/A 03/02/2013   Procedure: PERMANENT PACEMAKER INSERTION;  Surgeon: Evans Lance, MD;  Location: [facility] CATH LAB;  Service: Cardiovascular;  Laterality: N/A;  . SHOULDER ARTHROSCOPY W/ ROTATOR CUFF REPAIR    . SHOULDER SURGERY Right    roto cuff  . TOE AMPUTATION Left    2nd and 3rd, bunion under toes.  . TONSILLECTOMY    . VARICOSE VEIN SURGERY      Current Medications: Current Meds  Medication Sig  . acetaminophen (TYLENOL) 500 MG tablet Take 1,000 mg by mouth every 6 (six) hours as needed. For pain  . atorvastatin (LIPITOR) 10  MG tablet Take 10 mg by mouth every evening.  . carvedilol (COREG) 3.125 MG tablet Take 1 tablet (3.125 mg total) by mouth 2 (two) times daily with a meal.  . dorzolamide (TRUSOPT) 2 % ophthalmic solution Place 1 drop into the right eye 2 (two) times daily.  Marland Kitchen LORazepam (ATIVAN) 0.5 MG tablet Take 0.5 tablets by mouth at bedtime as needed.  . meclizine (ANTIVERT) 25 MG tablet Take 25 mg by mouth as needed for dizziness.  . Multiple Vitamin (MULTIVITAMIN WITH MINERALS) TABS Take 1 tablet by mouth daily.  Marland Kitchen omeprazole (PRILOSEC) 20 MG capsule Take 20 mg by mouth daily.  . sotalol (BETAPACE) 80 MG tablet Take 1 tablet (80 mg total) by mouth 2 (two) times daily.  Marland Kitchen spironolactone (ALDACTONE) 25 MG tablet Take 0.5 tablets (12.5 mg total) by mouth daily.  Marland Kitchen warfarin (COUMADIN) 5 MG tablet TAKE 1/2 TO 1 TABLET DAILY AS DIRECTED BY ANTICOAGULATION CLINIC  . [DISCONTINUED] carvedilol (COREG) 6.25 MG tablet Take 1 tablet (6.25 mg total) by mouth 2 (two) times daily with a meal.     Allergies:   Tramadol   Social History   Social History  . Marital status: Married    Spouse name: N/A  . Number of children: N/A  . Years of education: N/A   Social History Main Topics  . Smoking status: Never Smoker  . Smokeless tobacco: Never Used  . Alcohol use No  . Drug use: No  . Sexual activity: Not Asked   Other Topics Concern  . None   Social History Narrative  . None     Family History:  The patient's family history includes Hypertension in her father and mother.   ROS:   Please see the history of present illness.    Review of Systems  Constitution: Positive for weakness and malaise/fatigue.  HENT: Negative.   Eyes: Negative.   Cardiovascular: Positive for palpitations.  Respiratory: Negative.   Hematologic/Lymphatic: Negative.   Musculoskeletal: Negative.  Negative for joint pain.  Gastrointestinal: Negative.   Genitourinary: Positive for dysuria, nocturia and urgency.  Neurological:  Positive for dizziness.   All other systems reviewed and are negative.   PHYSICAL EXAM:   VS:  BP 110/70 (BP Location: Right Arm, Patient Position: Sitting, Cuff Size: Normal)   Pulse (!) 104   Ht 5\' 6"  (1.676 m)   Wt 188 lb 12.8 oz (85.6 kg)   SpO2 98%   BMI 30.47 kg/m   Physical Exam  GEN: Well nourished, well developed, in no acute distress  Neck: no JVD, carotid bruits, or masses JJOACZY:SAY;3/0 systolic murmur at the apex Respiratory:  clear to auscultation bilaterally, normal work of breathing GI: soft, nontender, nondistended, +  BS Ext: without cyanosis, clubbing, or edema, Good distal pulses bilaterally MS: no deformity or atrophy  Skin: warm and dry, no rash Neuro:  Alert and Oriented x 3 Psych: euthymic mood, full affect  Wt Readings from Last 3 Encounters:  05/17/17 188 lb 12.8 oz (85.6 kg)  03/24/17 186 lb (84.4 kg)  03/11/17 185 lb 1.9 oz (84 kg)      Studies/Labs Reviewed:   EKG:  EKG is ordered today.  The ekg ordered today demonstrates Normal sinus rhythm at 100 bpm nonspecific intraventricular conduction delay, nonspecific ST-T wave changes  Recent Labs: 02/23/2017: ALT 13; Hemoglobin 12.9; Magnesium 1.9; Platelets 214; TSH 3.115 03/01/2017: BUN 20; Creatinine, Ser 1.02; Potassium 4.9; Sodium 139   Lipid Panel    Component Value Date/Time   CHOL 156 05/23/2012 0514   TRIG 86 05/23/2012 0514   HDL 80 05/23/2012 0514   CHOLHDL 2.0 05/23/2012 0514   VLDL 17 05/23/2012 0514   LDLCALC 59 05/23/2012 0514    Additional studies/ records that were reviewed today include:   2-D echo 05/10/17 Study Conclusions   - Left ventricle: The cavity size was normal. Wall thickness was   normal. Systolic function was moderately reduced. The estimated   ejection fraction was in the range of 35% to 40%. Diffuse   hypokinesis. Features are consistent with a pseudonormal left   ventricular filling pattern, with concomitant abnormal relaxation   and increased filling  pressure (grade 2 diastolic dysfunction). - Aortic valve: There was trivial regurgitation. - Mitral valve: There was moderate regurgitation. - Left atrium: The atrium was mildly dilated. - Right ventricle: Systolic function was moderately reduced. - Tricuspid valve: There was moderate regurgitation. - Pulmonary arteries: Systolic pressure was moderately increased.   PA peak pressure: 45 mm Hg (S). Study Conclusions   - Left ventricle: The cavity size was normal. Wall thickness was   increased in a pattern of mild LVH. Systolic function was   severely reduced. The estimated ejection fraction was in the   range of 20% to 25%. Diffuse hypokinesis. - Aortic valve: There was mild regurgitation. - Mitral valve: Calcified annulus. There was severe regurgitation. - Left atrium: The atrium was severely dilated. - Right ventricle: The cavity size was mildly dilated. - Right atrium: The atrium was moderately dilated. - Tricuspid valve: There was moderate regurgitation. - Pulmonary arteries: Systolic pressure was moderately increased.   PA peak pressure: 58 mm Hg (S).   Impressions:   - Severe global reduction in LV systolic function; mild LVH;l mild   AI; severe MR; biatrial enlargement; mild RVE; moderate TR;   moderately elevated pulmonary pressure.     ASSESSMENT:    1. Medication management   2. Essential hypertension   3. Persistent atrial fibrillation ([place])   4. Nocturia   5. Hypotension due to drugs   6. DCM (dilated cardiomyopathy) ([place])      PLAN:  In order of problems listed above:  Medication management and patient was to be started on Entresto today for dilated cardiomyopathy. She held her losartan yesterday. Unfortunately blood pressure readings brought into her to day show systolic blood pressures ranging from 81-100. She says she feels terrible when her blood pressure runs low. Plan today will be reduced losartan to 25 mg once daily in hopes that her blood pressure  comes up. Follow-up with Dr. Radford Pax for further medical management.  Essential hypertension blood pressure low see above  Persistent atrial fibrillation in normal sinus rhythm on sotalol followed  by Dr. [name]  Nocturia with some burning patient's requesting a UA C&S be done today which we will do.  Hypotension due to medications have adjusted  Dilated cardiomyopathy with worsening LVEF 20-25% most likely related to tachycardia induced cardiomyopathy. Follow-up 2-D echo showed some improvement LVEF 35-40%. can't adjust meds further at this time because of hypotension.    Medication Adjustments/Labs and Tests Ordered: Current medicines are reviewed at length with the patient today.  Concerns regarding medicines are outlined above.  Medication changes, Labs and Tests ordered today are listed in the Patient Instructions below. Patient Instructions  Medication Instructions:  Your physician has recommended you make the following change in your medication:  1-Decrease Losartan 25 mg by mouth daily  Labwork: Your physician recommends that you have lab work today for urinalysis and urine culture  Testing/Procedures: NONE  Follow-Up: Your physician recommends that you schedule a follow-up appointment in: 1 month with Dr. Radford Pax.   Any Other Special Instructions Will Be Listed Below (If Applicable).     If you need a refill on your cardiac medications before your next appointment, please call your pharmacy.      Signed, Ermalinda Barrios, PA-C  05/17/2017 1:55 PM    [facility] [address], [place], [place]  [address] Phone: [phone]; Fax: [phone]

## 2017-05-17 NOTE — Patient Instructions (Addendum)
Medication Instructions:  Your physician has recommended you make the following change in your medication:  1-Decrease Losartan 25 mg by mouth daily  Labwork: Your physician recommends that you have lab work today for urinalysis and urine culture  Testing/Procedures: NONE  Follow-Up: Your physician recommends that you schedule a follow-up appointment in: 1 month with Dr. Radford Pax.   Any Other Special Instructions Will Be Listed Below (If Applicable).     If you need a refill on your cardiac medications before your next appointment, please call your pharmacy.

## 2017-05-18 ENCOUNTER — Telehealth: Payer: Self-pay | Admitting: Physician Assistant

## 2017-05-18 LAB — URINALYSIS
BILIRUBIN UA: NEGATIVE
GLUCOSE, UA: NEGATIVE
Nitrite, UA: NEGATIVE
Specific Gravity, UA: 1.022 (ref 1.005–1.030)
UUROB: 0.2 mg/dL (ref 0.2–1.0)
pH, UA: 5 (ref 5.0–7.5)

## 2017-05-18 NOTE — Telephone Encounter (Signed)
Returned pts call.  She states that her pain is worse today than yesterday and she was wanting to know if we had her UA back yet. I advised pt that I don't have it as of yet, and that Ermalinda Barrios, PA-C was not in the office today, but I would send her a message to see if she had it and what she could do.  Pt states that she can barley wait any longer.  Please advise!

## 2017-05-18 NOTE — Telephone Encounter (Signed)
New message      Did you receive the lab results back for the urinalysis? She is in a lot of pain.

## 2017-05-19 LAB — URINE CULTURE

## 2017-05-20 ENCOUNTER — Other Ambulatory Visit: Payer: Self-pay | Admitting: *Deleted

## 2017-05-20 MED ORDER — AMPICILLIN 500 MG PO CAPS: 500.0000 mg | ORAL_CAPSULE | Freq: Four times a day (QID) | ORAL | 0 refills | Status: DC

## 2017-05-21 ENCOUNTER — Other Ambulatory Visit: Payer: Self-pay | Admitting: *Deleted

## 2017-05-21 MED ORDER — LOSARTAN POTASSIUM 25 MG PO TABS: 25.0000 mg | ORAL_TABLET | Freq: Every day | ORAL | 3 refills | Status: DC

## 2017-06-02 ENCOUNTER — Ambulatory Visit: Payer: Medicare Other | Admitting: Cardiology

## 2017-06-07 ENCOUNTER — Other Ambulatory Visit: Payer: Self-pay | Admitting: *Deleted

## 2017-06-07 DIAGNOSIS — I480 Paroxysmal atrial fibrillation: Secondary | ICD-10-CM

## 2017-06-07 MED ORDER — SOTALOL HCL 80 MG PO TABS: 80.0000 mg | ORAL_TABLET | Freq: Two times a day (BID) | ORAL | 10 refills | Status: DC

## 2017-06-21 ENCOUNTER — Other Ambulatory Visit: Payer: Self-pay | Admitting: Cardiology

## 2017-06-22 ENCOUNTER — Ambulatory Visit (INDEPENDENT_AMBULATORY_CARE_PROVIDER_SITE_OTHER): Payer: Medicare Other | Admitting: *Deleted

## 2017-06-22 DIAGNOSIS — I4891 Unspecified atrial fibrillation: Secondary | ICD-10-CM | POA: Diagnosis not present

## 2017-06-22 DIAGNOSIS — I4819 Other persistent atrial fibrillation: Secondary | ICD-10-CM

## 2017-06-22 DIAGNOSIS — Z5181 Encounter for therapeutic drug level monitoring: Secondary | ICD-10-CM

## 2017-06-22 DIAGNOSIS — I481 Persistent atrial fibrillation: Secondary | ICD-10-CM

## 2017-06-22 LAB — POCT INR: INR: 3.1

## 2017-06-23 ENCOUNTER — Ambulatory Visit (INDEPENDENT_AMBULATORY_CARE_PROVIDER_SITE_OTHER): Payer: Medicare Other | Admitting: *Deleted

## 2017-06-23 DIAGNOSIS — I495 Sick sinus syndrome: Secondary | ICD-10-CM

## 2017-06-24 NOTE — Progress Notes (Signed)
Remote pacemaker transmission.   

## 2017-06-25 LAB — CUP PACEART REMOTE DEVICE CHECK
Battery Remaining Percentage: 100 %
Date Time Interrogation Session: 20180829064100
Implantable Lead Implant Date: 20140508
Implantable Lead Location: 753860
Implantable Lead Serial Number: 29379474
Implantable Pulse Generator Implant Date: 20140508
Lead Channel Impedance Value: 667 Ohm
Lead Channel Impedance Value: 685 Ohm
Lead Channel Pacing Threshold Amplitude: 1 V
Lead Channel Pacing Threshold Pulse Width: 0.4 ms
Lead Channel Setting Pacing Amplitude: 2.4 V
MDC IDC LEAD IMPLANT DT: 20140508
MDC IDC LEAD LOCATION: 753859
MDC IDC LEAD SERIAL: 29333020
MDC IDC MSMT BATTERY REMAINING LONGEVITY: 114 mo
MDC IDC SET LEADCHNL RA PACING AMPLITUDE: 2 V
MDC IDC SET LEADCHNL RV PACING PULSEWIDTH: 0.4 ms
MDC IDC SET LEADCHNL RV SENSING SENSITIVITY: 2.5 mV
MDC IDC STAT BRADY RA PERCENT PACED: 54 %
MDC IDC STAT BRADY RV PERCENT PACED: 12 %
Pulse Gen Serial Number: 112392

## 2017-07-02 ENCOUNTER — Encounter: Payer: Self-pay | Admitting: Cardiology

## 2017-07-05 ENCOUNTER — Other Ambulatory Visit: Payer: Self-pay | Admitting: Cardiology

## 2017-07-21 ENCOUNTER — Ambulatory Visit (INDEPENDENT_AMBULATORY_CARE_PROVIDER_SITE_OTHER): Payer: Medicare Other | Admitting: *Deleted

## 2017-07-21 DIAGNOSIS — Z5181 Encounter for therapeutic drug level monitoring: Secondary | ICD-10-CM | POA: Diagnosis not present

## 2017-07-21 DIAGNOSIS — I481 Persistent atrial fibrillation: Secondary | ICD-10-CM | POA: Diagnosis not present

## 2017-07-21 DIAGNOSIS — I4891 Unspecified atrial fibrillation: Secondary | ICD-10-CM | POA: Diagnosis not present

## 2017-07-21 DIAGNOSIS — I4819 Other persistent atrial fibrillation: Secondary | ICD-10-CM

## 2017-07-21 LAB — POCT INR: INR: 2.5

## 2017-07-26 ENCOUNTER — Ambulatory Visit (INDEPENDENT_AMBULATORY_CARE_PROVIDER_SITE_OTHER): Payer: Medicare Other | Admitting: Cardiology

## 2017-07-26 ENCOUNTER — Encounter: Payer: Self-pay | Admitting: Cardiology

## 2017-07-26 VITALS — BP 105/70 | HR 70 | Ht 65.0 in | Wt 188.0 lb

## 2017-07-26 DIAGNOSIS — I340 Nonrheumatic mitral (valve) insufficiency: Secondary | ICD-10-CM | POA: Diagnosis not present

## 2017-07-26 DIAGNOSIS — I495 Sick sinus syndrome: Secondary | ICD-10-CM | POA: Diagnosis not present

## 2017-07-26 DIAGNOSIS — I10 Essential (primary) hypertension: Secondary | ICD-10-CM

## 2017-07-26 DIAGNOSIS — I481 Persistent atrial fibrillation: Secondary | ICD-10-CM | POA: Diagnosis not present

## 2017-07-26 DIAGNOSIS — I4819 Other persistent atrial fibrillation: Secondary | ICD-10-CM

## 2017-07-26 DIAGNOSIS — I420 Dilated cardiomyopathy: Secondary | ICD-10-CM

## 2017-07-26 MED ORDER — LOSARTAN POTASSIUM 25 MG PO TABS: 12.5000 mg | ORAL_TABLET | Freq: Every day | ORAL | 3 refills | Status: DC

## 2017-07-26 NOTE — Patient Instructions (Signed)
Medication Instructions:  Your physician has recommended you make the following change in your medication: 1.  Decrease your losartan to 12.5 mg by mouth daily.  Split your 25 mg tablet you currently take in 1/2.    Labwork: None ordered.  Testing/Procedures: None ordered.  Follow-Up:  Follow up with Dr. [name] in 1-2 weeks.  Follow up with Dr. Radford Pax in 3 months.  Any Other Special Instructions Will Be Listed Below (If Applicable).     If you need a refill on your cardiac medications before your next appointment, please call your pharmacy.

## 2017-07-26 NOTE — Progress Notes (Signed)
Cardiology Office Note:    Date:  07/27/2017   ID:  Molly [name], DOB June 08, 1933, MRN [id number]  PCP:  Antony Contras, MD  Cardiologist:  Fransico Him, MD   Referring MD: Antony Contras, MD   Chief Complaint  Patient presents with  . Atrial Fibrillation  . Cardiomyopathy  . Mitral Regurgitation    History of Present Illness:    Molly [name] is a 83 y.o. female with a hx of difficult to treat persistent atrial fibrillation on Coumadin. She was admitted to the hospital with atrial flutter with RVR and started on IV Cardizem. Troponins negative 3. She also has dilatedcardiomyopathy EF 40-45% on echo 11/2016. Repeat echo EF 20-25% felt likely related to tachycardia induced cardiomyopathy, also showed severe MR but likely related to annular dilatation from DCM a worsening LVEF.Marland Kitchen Sotalol was increased to 80 mg BID.Losartan increased to 50 mg daily. Coreg started with plans for repeat echo in 2 months. Has tachybradycardia syndrome with permanent pacemaker followed by Dr. [name]. She was seen by Estella Husk PA in May and her Carvedilol was cut back to 6.25mg  BID due to intolerance with severe fatigue.  She was see by Dr. [name] later that month and he decreased her Sotolol to 80mg  daily as she was maintaining NSR. Her last echo showed improvement in her EF to 35-40% with improvement in her mr from severe to moderate.    She is here today for followup.  She has been feeling poorly for several weeks.  She says that she is very fatigued and has no energy to do anything and barely leaves the house.  She has been having more breakthrough of PAF recently despite a higher dose of Sotolol.  She also has been having drops in her BP with it staying in the low 937'J systolic which makes her feel bad.  She denies any chest pain or SOB, PND, orthopnea, LE edema, dizziness or syncope.   Past Medical History:  Diagnosis Date  . Aortic insufficiency    mild by echo 2017  . Cancer ([place])    Skin  cancer- basal.  1 mylenoma  . Complication of anesthesia   . DCM (dilated cardiomyopathy) ([place])    EF 40-45% by echo 2017 - nuclear stress test with no ischemia  . GERD (gastroesophageal reflux disease)   . H/O benign essential tremor    on amiodarone resolved off amio  . H/O hyperkalemia    Resolved off ACE inhibitors  . High cholesterol   . History of blood transfusion   . Hyperparathyroidism ([place])   . Hypertension   . Mitral regurgitation    mild to moderate by echo 2017  . Persistent atrial fibrillation ([place])   . PONV (postoperative nausea and vomiting)   . PVC's (premature ventricular contractions)   . Renal insufficiency    MILD  . Shortness of breath    with exertion  . Tachycardia-bradycardia syndrome [facility])    s/p PPM 02/2013    Past Surgical History:  Procedure Laterality Date  . AMPUTATION Right 11/27/2014   Procedure: RIGHT 2ND AND 3RD TOE AMPUTATION;  Surgeon: Wylene Simmer, MD;  Location: [place];  Service: Orthopedics;  Laterality: Right;  . BUNIONECTOMY     Right  . CARDIAC CATHETERIZATION  09/25/2000   EF of 50% -- with normal left ventricular size and function  . CESAREAN SECTION    . CESAREAN SECTION    . EYE SURGERY Bilateral    Cataract  . FOOT SURGERY    .  INSERT / REPLACE / REMOVE PACEMAKER  02/2013  . PACEMAKER INSERTION  02/2013  . PERMANENT PACEMAKER INSERTION N/A 03/02/2013   Procedure: PERMANENT PACEMAKER INSERTION;  Surgeon: Evans Lance, MD;  Location: [facility] CATH LAB;  Service: Cardiovascular;  Laterality: N/A;  . SHOULDER ARTHROSCOPY W/ ROTATOR CUFF REPAIR    . SHOULDER SURGERY Right    roto cuff  . TOE AMPUTATION Left    2nd and 3rd, bunion under toes.  . TONSILLECTOMY    . VARICOSE VEIN SURGERY      Current Medications: Current Meds  Medication Sig  . acetaminophen (TYLENOL) 500 MG tablet Take 1,000 mg by mouth every 6 (six) hours as needed. For pain  . atorvastatin (LIPITOR) 10 MG tablet Take 10 mg by mouth every evening.  . carvedilol  (COREG) 3.125 MG tablet Take 1 tablet (3.125 mg total) by mouth 2 (two) times daily with a meal.  . dorzolamide (TRUSOPT) 2 % ophthalmic solution Place 1 drop into the right eye 2 (two) times daily.  Marland Kitchen LORazepam (ATIVAN) 0.5 MG tablet Take 0.5 tablets by mouth at bedtime as needed.  . meclizine (ANTIVERT) 25 MG tablet Take 25 mg by mouth as needed for dizziness.  . Multiple Vitamin (MULTIVITAMIN WITH MINERALS) TABS Take 1 tablet by mouth daily.  Marland Kitchen omeprazole (PRILOSEC) 20 MG capsule Take 20 mg by mouth daily.  . sotalol (BETAPACE) 80 MG tablet Take 1 tablet (80 mg total) by mouth 2 (two) times daily.  Marland Kitchen spironolactone (ALDACTONE) 25 MG tablet TAKE 1/2 TABLET ONCE A DAY  . warfarin (COUMADIN) 5 MG tablet TAKE 1/2 TO 1 TABLET DAILY AS DIRECTED BY ANTICOAGULATION CLINIC  . [DISCONTINUED] ampicillin (PRINCIPEN) 500 MG capsule Take 1 capsule (500 mg total) by mouth 4 (four) times daily.  . [DISCONTINUED] losartan (COZAAR) 25 MG tablet Take 1 tablet (25 mg total) by mouth daily. Hold tablet if systolic is < 097.     Allergies:   Tramadol   Social History   Social History  . Marital status: Married    Spouse name: N/A  . Number of children: N/A  . Years of education: N/A   Social History Main Topics  . Smoking status: Never Smoker  . Smokeless tobacco: Never Used  . Alcohol use No  . Drug use: No  . Sexual activity: Not Asked   Other Topics Concern  . None   Social History Narrative  . None     Family History: The patient's family history includes Hypertension in her father and mother.  ROS:   Please see the history of present illness.    ROS  All other systems reviewed and negative.   EKGs/Labs/Other Studies Reviewed:    The following studies were reviewed today: none  EKG:  EKG is ordered today showing atrial paced rhythm with nonspecific IVCd, PVCs and QTc 472msec.  Recent Labs: 02/23/2017: ALT 13; Hemoglobin 12.9; Magnesium 1.9; Platelets 214; TSH 3.115 03/01/2017: BUN  20; Creatinine, Ser 1.02; Potassium 4.9; Sodium 139   Recent Lipid Panel    Component Value Date/Time   CHOL 156 05/23/2012 0514   TRIG 86 05/23/2012 0514   HDL 80 05/23/2012 0514   CHOLHDL 2.0 05/23/2012 0514   VLDL 17 05/23/2012 0514   LDLCALC 59 05/23/2012 0514    Physical Exam:    VS:  BP 105/70   Pulse 70   Ht 5\' 5"  (1.651 m)   Wt 188 lb (85.3 kg)   BMI 31.28 kg/m  Wt Readings from Last 3 Encounters:  07/26/17 188 lb (85.3 kg)  05/17/17 188 lb 12.8 oz (85.6 kg)  03/24/17 186 lb (84.4 kg)     GEN:  Well nourished, well developed in no acute distress HEENT: Normal NECK: No JVD; No carotid bruits LYMPHATICS: No lymphadenopathy CARDIAC: RRR, no murmurs, rubs, gallops RESPIRATORY:  Clear to auscultation without rales, wheezing or rhonchi  ABDOMEN: Soft, non-tender, non-distended MUSCULOSKELETAL:  No edema; No deformity  SKIN: Warm and dry NEUROLOGIC:  Alert and oriented x 3 PSYCHIATRIC:  Normal affect   ASSESSMENT:    1. Persistent atrial fibrillation ([place])   2. Essential hypertension   3. Non-rheumatic mitral regurgitation   4. DCM (dilated cardiomyopathy) ([place])   5. Tachycardia-bradycardia syndrome ([place])    PLAN:    In order of problems listed above:  1.  Persistent atrial fibrillation - EKG today showed paced atrial rhythm with intrinsic ventricular conduction with IVCD.  Not sure if she is having breakthrough of afib but says that she has been in it a lot the past 2 weeks and is very frustrated.  I will keep her on Sotolol 80mg  BID for now as her QTc is normal and refer back to see Dr. [name] for further recommendations on her AAD therapy.   I am leary to stop her BB for fear she will have more breakthrough of her PAF.    2.  HTN - BP soft on exam today. See #4  3.  Moderate MR - this has improved on most recent echo in July and has gone from severe to moderate with improved LVF.  Will repeat echo in 1 year.    4.  DCM -EF improved by echo 04/2017 and  is now 35-40%.  She will continue on carvedilol but since she is having soft BPs will decrease Losartan to 12.5mg  daily.  Her BP is too soft to start entresto which had been the plan in the past.    5  Tachybrady syndrome s/p PPM - she is followed in device clinic.    Medication Adjustments/Labs and Tests Ordered: Current medicines are reviewed at length with the patient today.  Concerns regarding medicines are outlined above.  Orders Placed This Encounter  Procedures  . EKG 12-Lead   Meds ordered this encounter  Medications  . losartan (COZAAR) 25 MG tablet    Sig: Take 0.5 tablets (12.5 mg total) by mouth daily.    Dispense:  90 tablet    Refill:  3    Signed, Fransico Him, MD  07/27/2017 12:36 PM    [place]

## 2017-08-03 ENCOUNTER — Encounter: Payer: Self-pay | Admitting: Internal Medicine

## 2017-08-03 ENCOUNTER — Ambulatory Visit (INDEPENDENT_AMBULATORY_CARE_PROVIDER_SITE_OTHER): Payer: Medicare Other | Admitting: Internal Medicine

## 2017-08-03 VITALS — BP 120/78 | HR 71 | Ht 65.5 in | Wt 187.0 lb

## 2017-08-03 DIAGNOSIS — Z950 Presence of cardiac pacemaker: Secondary | ICD-10-CM

## 2017-08-03 DIAGNOSIS — I495 Sick sinus syndrome: Secondary | ICD-10-CM

## 2017-08-03 DIAGNOSIS — I481 Persistent atrial fibrillation: Secondary | ICD-10-CM

## 2017-08-03 DIAGNOSIS — I4819 Other persistent atrial fibrillation: Secondary | ICD-10-CM

## 2017-08-03 NOTE — Patient Instructions (Addendum)
Medication Instructions:  Your physician has recommended you make the following change in your medication:  1.  Stop taking sotalol.  Your last dose will be 08/13/2017.  Labwork: None ordered.  Testing/Procedures: Initiate dofetilide.  Follow-Up:  Referring to Afib clinic for dofetilide initiation.    Any Other Special Instructions Will Be Listed Below (If Applicable).   If you need a refill on your cardiac medications before your next appointment, please call your pharmacy.  Dofetilide capsules What is this medicine? DOFETILIDE (doe FET il ide) is an antiarrhythmic drug. It helps make your heart beat regularly. This medicine also helps to slow rapid heartbeats. This medicine may be used for other purposes; ask your health care provider or pharmacist if you have questions. COMMON BRAND NAME(S): Tikosyn What should I tell my health care provider before I take this medicine? They need to know if you have any of these conditions: -heart disease -history of low levels of potassium or magnesium -kidney disease -liver disease -an unusual or allergic reaction to dofetilide, other medicines, foods, dyes, or preservatives -pregnant or trying to get pregnant -breast-feeding How should I use this medicine? Take this medicine by mouth with a glass of water. Follow the directions on the prescription label. You can take this medicine with or without food. Do not drink grapefruit juice with this medicine. Take your doses at regular intervals. Do not take your medicine more often than directed. Do not stop taking this medicine suddenly. This may cause serious, heart-related side effects. Your doctor will tell you how much medicine to take. If your doctor wants you to stop the medicine, the dose will be slowly lowered over time to avoid any side effects. Talk to your pediatrician regarding the use of this medicine in children. Special care may be needed. Overdosage: If you think you have taken  too much of this medicine contact a poison control center or emergency room at once. NOTE: This medicine is only for you. Do not share this medicine with others. What if I miss a dose? If you miss a dose, take it as soon as you can. If it is almost time for your next dose, take only that dose. Do not take double or extra doses. What may interact with this medicine? Do not take this medicine with any of the following medications: -cimetidine -dolutegravir -hydrochlorothiazide alone or in combination with other medicines -isavuconazonium -ketoconazole -megestrol -other medicines that prolong the QT interval (cause an abnormal heart rhythm) -prochlorperazine -trimethoprim alone or in combination with sulfamethoxazole -verapamil This medicine may also interact with the following medications: -amiloride -certain antidepressants like fluvoxamine or paroxetine -certain antiviral medicines for HIV or AIDS like atazanavir or darunavir -certain medicines for fungal infections like clotrimazole or miconazole -digoxin -diltiazem -dronabinol, THC -grapefruit juice -metformin -nefazodone -triamterene -zafirlukast This list may not describe all possible interactions. Give your health care provider a list of all the medicines, herbs, non-prescription drugs, or dietary supplements you use. Also tell them if you smoke, drink alcohol, or use illegal drugs. Some items may interact with your medicine. What should I watch for while using this medicine? Visit your doctor or health care professional for regular checks on your progress. Wear a medical ID bracelet or chain, and carry a card that describes your disease and details of your medicine and dosage times. Check your heart rate and blood pressure regularly while you are taking this medicine. Ask your doctor or health care professional what your heart rate and blood pressure should  be, and when you should contact him or her. Your doctor or health care  professional also may schedule regular tests to check your progress. You will be started on this medicine in a specialized facility for at least three days. You will be monitored to find the right dose of medicine for you. It is very important that you take your medicine exactly as prescribed when you leave the hospital. The correct dosing of this medicine is very important to treat your condition and prevent possible serious side effects. What side effects may I notice from receiving this medicine? Side effects that you should report to your doctor or health care professional as soon as possible: -allergic reactions like skin rash, itching or hives, swelling of the face, lips, or tongue -breathing problems -dizziness -fast or rapid beating of the heart -feeling faint or lightheaded -swelling of the ankles -unusually weak or tired -vomiting Side effects that usually do not require medical attention (report to your doctor or health care professional if they continue or are bothersome): -cough -diarrhea -difficulty sleeping -headache -nausea -stomach pain This list may not describe all possible side effects. Call your doctor for medical advice about side effects. You may report side effects to FDA at 1-800-FDA-1088. Where should I keep my medicine? Keep out of the reach of children. Store at room temperature between 15 and 30 degrees C (59 and 86 degrees F). Protect the medicine from moisture or humidity. Keep container tightly closed. Throw away any unused medicine after the expiration date. NOTE: This sheet is a summary. It may not cover all possible information. If you have questions about this medicine, talk to your doctor, pharmacist, or health care provider.  2018 Elsevier/Gold Standard (2016-08-24 09:81:19)

## 2017-08-03 NOTE — Progress Notes (Signed)
HPI Molly [name] returns today for ongoing evaluation and management of her atrial fib and tachycardia, sinus node dysfunction s/p PPM insertion. The patient had been admitted several years ago for initiation of sotalol therapy for uncontrolled atrial fibrillation. Prior to that she had been on amiodarone. The patient developed symptomatic bradycardia and sinus node dysfunction and underwent permanent pacemaker insertion. She had done reasonably well for several years. She has unfortunately had worsening fatigue and malaise, and her dose of sotalol has been decreased. The patient has had breakthrough episodes of atrial fibrillation and atrial tachycardia with rapid ventricular responses. She has not had syncope. Review of her pacemaker today demonstrates that she has been out of rhythm for hours and days at a time and her ventricular rate is quite fast. She has not had syncope, but gets lightheaded. Allergies  Allergen Reactions  . Tramadol Nausea Only     Current Outpatient Prescriptions  Medication Sig Dispense Refill  . acetaminophen (TYLENOL) 500 MG tablet Take 1,000 mg by mouth every 6 (six) hours as needed. For pain    . atorvastatin (LIPITOR) 10 MG tablet Take 10 mg by mouth every evening.    . carvedilol (COREG) 3.125 MG tablet Take 1 tablet (3.125 mg total) by mouth 2 (two) times daily with a meal. 180 tablet 3  . dorzolamide (TRUSOPT) 2 % ophthalmic solution Place 1 drop into the right eye 2 (two) times daily.    Marland Kitchen LORazepam (ATIVAN) 0.5 MG tablet Take 0.5 tablets by mouth at bedtime as needed.  0  . losartan (COZAAR) 25 MG tablet Take 0.5 tablets (12.5 mg total) by mouth daily. 90 tablet 3  . meclizine (ANTIVERT) 25 MG tablet Take 25 mg by mouth as needed for dizziness (Take as directed).     . Multiple Vitamin (MULTIVITAMIN WITH MINERALS) TABS Take 1 tablet by mouth daily.    Marland Kitchen omeprazole (PRILOSEC) 20 MG capsule Take 20 mg by mouth daily.    . sotalol (BETAPACE) 80 MG tablet  Take 1 tablet (80 mg total) by mouth 2 (two) times daily. 60 tablet 10  . spironolactone (ALDACTONE) 25 MG tablet TAKE 1/2 TABLET ONCE A DAY 30 tablet 10  . warfarin (COUMADIN) 5 MG tablet TAKE 1/2 TO 1 TABLET DAILY AS DIRECTED BY ANTICOAGULATION CLINIC 30 tablet 3   No current facility-administered medications for this visit.      Past Medical History:  Diagnosis Date  . Aortic insufficiency    mild by echo 2017  . Cancer ([place])    Skin cancer- basal.  1 mylenoma  . Complication of anesthesia   . DCM (dilated cardiomyopathy) ([place])    EF 40-45% by echo 2017 - nuclear stress test with no ischemia  . GERD (gastroesophageal reflux disease)   . H/O benign essential tremor    on amiodarone resolved off amio  . H/O hyperkalemia    Resolved off ACE inhibitors  . High cholesterol   . History of blood transfusion   . Hyperparathyroidism ([place])   . Hypertension   . Mitral regurgitation    mild to moderate by echo 2017  . Persistent atrial fibrillation ([place])   . PONV (postoperative nausea and vomiting)   . PVC's (premature ventricular contractions)   . Renal insufficiency    MILD  . Shortness of breath    with exertion  . Tachycardia-bradycardia syndrome ([place])    s/p PPM 02/2013    ROS:   All systems reviewed and negative except  as noted in the HPI.   Past Surgical History:  Procedure Laterality Date  . AMPUTATION Right 11/27/2014   Procedure: RIGHT 2ND AND 3RD TOE AMPUTATION;  Surgeon: Wylene Simmer, MD;  Location: [place];  Service: Orthopedics;  Laterality: Right;  . BUNIONECTOMY     Right  . CARDIAC CATHETERIZATION  09/25/2000   EF of 50% -- with normal left ventricular size and function  . CESAREAN SECTION    . CESAREAN SECTION    . EYE SURGERY Bilateral    Cataract  . FOOT SURGERY    . INSERT / REPLACE / REMOVE PACEMAKER  02/2013  . PACEMAKER INSERTION  02/2013  . PERMANENT PACEMAKER INSERTION N/A 03/02/2013   Procedure: PERMANENT PACEMAKER INSERTION;  Surgeon: Evans Lance, MD;  Location: [facility] CATH LAB;  Service: Cardiovascular;  Laterality: N/A;  . SHOULDER ARTHROSCOPY W/ ROTATOR CUFF REPAIR    . SHOULDER SURGERY Right    roto cuff  . TOE AMPUTATION Left    2nd and 3rd, bunion under toes.  . TONSILLECTOMY    . VARICOSE VEIN SURGERY       Family History  Problem Relation Age of Onset  . Hypertension Mother   . Hypertension Father      Social History   Social History  . Marital status: Married    Spouse name: N/A  . Number of children: N/A  . Years of education: N/A   Occupational History  . Not on file.   Social History Main Topics  . Smoking status: Never Smoker  . Smokeless tobacco: Never Used  . Alcohol use No  . Drug use: No  . Sexual activity: Not on file   Other Topics Concern  . Not on file   Social History Narrative  . No narrative on file     BP 120/78   Pulse 71   Ht 5' 5.5" (1.664 m)   Wt 187 lb (84.8 kg)   SpO2 95%   BMI 30.65 kg/m   Physical Exam:  Well appearing 83 year old woman, NAD HEENT: Unremarkable Neck:  6 cm JVD, no thyromegally Lymphatics:  No adenopathy Back:  No CVA tenderness Lungs:  Clear, with no wheezes, rales, or rhonchi HEART:  Regular rate rhythm, no murmurs, no rubs, no clicks Abd:  soft, positive bowel sounds, no organomegally, no rebound, no guarding Ext:  2 plus pulses, no edema, no cyanosis, no clubbing Skin:  No rashes no nodules Neuro:  CN II through XII intact, motor grossly intact  EKG - none today, previously sinus rhythm with atrial pacing  DEVICE  Normal device function.  See PaceArt for details.   Assess/Plan: 1. Paroxysmal atrial fibrillation and tachycardia - she has had severely symptomatic episodes of her atrial arrhythmias despite medical therapy. We discussed the treatment options. I've recommended inpatient hospitalization for initiation of dofetilide therapy. Previously she has failed amiodarone. She has failed sotalol. We also discussed AV node ablation  and would consider this if dofetilide is unsuccessful at controlling her arrhythmias or if she is intolerant of this medication. My biggest concern about AV node ablation is the potential for developing right ventricular pacing induced ventricular dyssynchrony. 2. Pacemaker - her [place] pacemaker is working normally. We'll recheck in several months. 3. Hypertension - her blood pressure is well controlled. No change in medical therapy.  [name], M.D.

## 2017-08-04 ENCOUNTER — Telehealth: Payer: Self-pay | Admitting: Pharmacist

## 2017-08-04 NOTE — Telephone Encounter (Signed)
Medication list reviewed in anticipation of upcoming Tikosyn initiation. Patient is not taking any contraindicated or QTc prolonging medications. She has been instructed by Dr [name] to take her last dose of sotalol prior to starting Tikosyn on 10/23.  Patient is anticoagulated on warfarin but has not had any weekly INR checks. Contacted pt and she will come in tomorrow for INR check, however pt will only be able to have a maximum of ~2 therapeutic weeks of Coumadin if levels are in range. Would recommend either moving [place] admission back a week or getting a TEE prior to Tikosyn start. Would confirm this with Dr [name] as well.  Patient will need to be counseled to avoid use of Benadryl while on Tikosyn and in the 2-3 days prior to Tikosyn initiation.

## 2017-08-06 ENCOUNTER — Telehealth (HOSPITAL_COMMUNITY): Payer: Self-pay | Admitting: *Deleted

## 2017-08-06 ENCOUNTER — Ambulatory Visit: Payer: Medicare Other | Admitting: Cardiology

## 2017-08-06 NOTE — Telephone Encounter (Signed)
-----   Message from Damian Leavell, RN sent at 08/06/2017  8:22 AM EDT ----- Regarding: RE: dofetilide initiation Maralyn Sago, Per Dr. [name] "does not need weekly checks unless we document an INR < 2.0"  He said Pt does not need a TEE.  Let me know if that answers your question.  Sonia Baller   ----- Message ----- From: Juluis Mire, RN Sent: 08/04/2017  11:05 AM To: Damian Leavell, RN Subject: RE: dofetilide initiation                      thanks ----- Message ----- From: Damian Leavell, RN Sent: 08/04/2017  10:45 AM To: Juluis Mire, RN Subject: RE: dofetilide initiation                      Maralyn Sago, I didn't see the pharmacist note. I will ask Dr. [name]. Sonia Baller  ----- Message ----- From: Juluis Mire, RN Sent: 08/04/2017   9:22 AM To: Damian Leavell, RN Subject: FW: dofetilide initiation                      Please discuss with Dr. [name] in regards to coumadin prior to tikosyn admit. Then I will be in touch with patient for next steps.  [place] Morren RN Afib Clinic ----- Message ----- From: Leeroy Bock, [facility] Sent: 08/04/2017   9:09 AM To: Juluis Mire, RN Subject: RE: dofetilide initiation                      She has not had weekly INR checks yet. We can start them now but she will have only had a max of 2 weeks therapeutic INR readings at that time and would require TEE prior to Tikosyn start unless Dr [name] has specified otherwise.  ----- Message ----- From: Juluis Mire, RN Sent: 08/04/2017   8:49 AM To: Harlon Flor Supple, RPH Subject: FW: dofetilide initiation                      tikosyn start -- what will we need to do from coumadin aspect? ----- Message ----- From: Damian Leavell, RN Sent: 08/03/2017   4:41 PM To: Juluis Mire, RN, Sherran Needs, NP Subject: dofetilide initiation                          Dr. [name] is referring this patient for dofetilide initiation. He is off next week, he would like her to be admitted on  08/17/2017. I have instructed her her last dose of sotalol will be 08/13/2017.  Let me know if you need anything further from me Sonia Baller RN

## 2017-08-09 DIAGNOSIS — H401412 Capsular glaucoma with pseudoexfoliation of lens, right eye, moderate stage: Secondary | ICD-10-CM | POA: Diagnosis not present

## 2017-08-10 ENCOUNTER — Telehealth (HOSPITAL_COMMUNITY): Payer: Self-pay | Admitting: *Deleted

## 2017-08-10 ENCOUNTER — Ambulatory Visit (INDEPENDENT_AMBULATORY_CARE_PROVIDER_SITE_OTHER): Payer: Medicare Other | Admitting: *Deleted

## 2017-08-10 DIAGNOSIS — I4891 Unspecified atrial fibrillation: Secondary | ICD-10-CM

## 2017-08-10 DIAGNOSIS — Z5181 Encounter for therapeutic drug level monitoring: Secondary | ICD-10-CM

## 2017-08-10 DIAGNOSIS — I481 Persistent atrial fibrillation: Secondary | ICD-10-CM | POA: Diagnosis not present

## 2017-08-10 DIAGNOSIS — I4819 Other persistent atrial fibrillation: Secondary | ICD-10-CM

## 2017-08-10 LAB — POCT INR: INR: 2.2

## 2017-08-10 NOTE — Telephone Encounter (Signed)
Patient checked with insurance company for Dofetilide costs -- has a 342 deductible left to meet then copays would be 15.62 a month. Pt unsure if she will proceed with admission since will have to pay deductible again first of year.Pt will think about this and let me know on how she would like to proceed.

## 2017-08-12 ENCOUNTER — Telehealth: Payer: Self-pay | Admitting: Cardiology

## 2017-08-12 ENCOUNTER — Telehealth: Payer: Self-pay | Admitting: *Deleted

## 2017-08-12 NOTE — Telephone Encounter (Signed)
Call returned to Molly [name].  Per Molly [name] she completed research on cost of Tikosyn, and with having to meet deductible this year and then again in January she does not feel she can afford it at this time.  Per Molly [name] she would like to explore other options, liking increasing sotalol first.  Molly [name] states she will reassess situation in January and see if she is still interested.  Cancelled Molly [name] afib clinic appt for next Tuesday, and notified coumadin clinic of Molly [name] change in plans.  Coumadin clinic will follow up with Molly [name].  Will discuss Molly [name] options with Dr. [name] and return Molly [name] call next week.  Continue to monitor.

## 2017-08-12 NOTE — Telephone Encounter (Signed)
Follow Up :   Molly [name] is calling to discuss the procedure on Tuesday . Stating that she is not going to have the procedure she is going to wait until the first of the year . Please call

## 2017-08-12 NOTE — Telephone Encounter (Signed)
Pt has cancelled starting Tikosyn at this time so her weekly coumadin appts have been cancelled and made her an appt to be seen in coumadin clinic November 13,2018 at 3pm and she states understanding

## 2017-08-12 NOTE — Telephone Encounter (Signed)
New message    Patient wants to discuss if she should cancel coumadin appointment. Offered to cancel and reschedule for patient, but she did not want to cancel until she was able to speak with nurse. Please call

## 2017-08-12 NOTE — Telephone Encounter (Signed)
Called and spoke to the patient who states that she originally was going in for a Tikosyn start on Tuesday but she is no longer going to be doing that due to the co-pay requirement through her insurance. Patient is requesting to speak with Dr. Tanna Furry RN to discuss the next steps. Made patient aware that I would forward the message to Dr. Tanna Furry RN for her to call her back. Patient verbalized understanding and thanked me for the call.

## 2017-08-13 NOTE — Telephone Encounter (Signed)
See telephone note of 08/12/2017 regarding her coumadin appts Spoke with pt and cancelled her weekly appts and scheduled return visit to have INR checked in 1 month and she stated understanding

## 2017-08-17 ENCOUNTER — Inpatient Hospital Stay (HOSPITAL_COMMUNITY): Admission: RE | Admit: 2017-08-17 | Payer: Medicare Other | Source: Ambulatory Visit | Admitting: Nurse Practitioner

## 2017-08-23 ENCOUNTER — Telehealth: Payer: Self-pay

## 2017-08-23 NOTE — Telephone Encounter (Signed)
error 

## 2017-08-23 NOTE — Telephone Encounter (Signed)
Reviewed current Pt options with Dr. [name].  Per Dr. [name], Pt may try increasing her sotalol to 120 mg PO BID, however he is unsure if this will make her feel any better.  If Pt wants to increase sotalol, she will need a f/u EKG 7 days after increasing the sotalol. Spoke with Pt, per Pt at this time she feels ok.  Declines to make any changes in her meds, however she will call office if she starts to feel worse.  Pt states she will notify office if she wants to try increasing sotalol.  Pt asked if Dr. [name] thinks Phyllis Ginger should still be started in January.  Notified Pt that that was Dr. Tanna Furry recommendation.  Pt states she will think on starting tikosyn and be in touch with office.  No further needs at this time.

## 2017-08-25 LAB — CUP PACEART INCLINIC DEVICE CHECK
Brady Statistic RA Percent Paced: 52 %
Brady Statistic RV Percent Paced: 12 %
Implantable Lead Implant Date: 20140508
Implantable Lead Location: 753859
Implantable Lead Model: 4135
Implantable Lead Serial Number: 29379474
Implantable Pulse Generator Implant Date: 20140508
Lead Channel Impedance Value: 671 Ohm
Lead Channel Pacing Threshold Amplitude: 1 V
Lead Channel Pacing Threshold Pulse Width: 0.4 ms
Lead Channel Sensing Intrinsic Amplitude: 20.9 mV
Lead Channel Setting Pacing Amplitude: 2.4 V
MDC IDC LEAD IMPLANT DT: 20140508
MDC IDC LEAD LOCATION: 753860
MDC IDC LEAD SERIAL: 29333020
MDC IDC MSMT LEADCHNL RA SENSING INTR AMPL: 2.9 mV
MDC IDC MSMT LEADCHNL RV IMPEDANCE VALUE: 648 Ohm
MDC IDC MSMT LEADCHNL RV PACING THRESHOLD AMPLITUDE: 0.8 V
MDC IDC MSMT LEADCHNL RV PACING THRESHOLD PULSEWIDTH: 0.4 ms
MDC IDC SESS DTM: 20181009040000
MDC IDC SET LEADCHNL RA PACING AMPLITUDE: 2 V
MDC IDC SET LEADCHNL RV PACING PULSEWIDTH: 0.4 ms
MDC IDC SET LEADCHNL RV SENSING SENSITIVITY: 2.5 mV
Pulse Gen Serial Number: 112392

## 2017-09-07 ENCOUNTER — Ambulatory Visit (INDEPENDENT_AMBULATORY_CARE_PROVIDER_SITE_OTHER): Payer: Medicare Other | Admitting: Pharmacist

## 2017-09-07 DIAGNOSIS — I4891 Unspecified atrial fibrillation: Secondary | ICD-10-CM | POA: Diagnosis not present

## 2017-09-07 DIAGNOSIS — I481 Persistent atrial fibrillation: Secondary | ICD-10-CM

## 2017-09-07 DIAGNOSIS — Z5181 Encounter for therapeutic drug level monitoring: Secondary | ICD-10-CM

## 2017-09-07 DIAGNOSIS — I4819 Other persistent atrial fibrillation: Secondary | ICD-10-CM

## 2017-09-07 LAB — POCT INR: INR: 2.8

## 2017-09-07 NOTE — Patient Instructions (Signed)
Continue same dose 1/2 tablet everyday except 1 tablet on Mondays, Wednesdays and Fridays.   Recheck INR in 6 weeks.

## 2017-09-13 DIAGNOSIS — M47816 Spondylosis without myelopathy or radiculopathy, lumbar region: Secondary | ICD-10-CM | POA: Diagnosis not present

## 2017-09-13 DIAGNOSIS — M41126 Adolescent idiopathic scoliosis, lumbar region: Secondary | ICD-10-CM | POA: Diagnosis not present

## 2017-09-22 ENCOUNTER — Ambulatory Visit (INDEPENDENT_AMBULATORY_CARE_PROVIDER_SITE_OTHER): Payer: Medicare Other | Admitting: *Deleted

## 2017-09-22 DIAGNOSIS — I495 Sick sinus syndrome: Secondary | ICD-10-CM | POA: Diagnosis not present

## 2017-09-22 NOTE — Progress Notes (Signed)
Remote pacemaker transmission.   

## 2017-09-24 ENCOUNTER — Encounter: Payer: Self-pay | Admitting: Cardiology

## 2017-10-05 LAB — CUP PACEART REMOTE DEVICE CHECK
Battery Remaining Longevity: 90 mo
Battery Remaining Percentage: 93 %
Brady Statistic RA Percent Paced: 81 %
Brady Statistic RV Percent Paced: 11 %
Implantable Lead Implant Date: 20140508
Implantable Lead Location: 753859
Implantable Lead Model: 4135
Implantable Lead Serial Number: 29333020
Implantable Lead Serial Number: 29379474
Lead Channel Pacing Threshold Pulse Width: 0.4 ms
Lead Channel Setting Pacing Amplitude: 2 V
Lead Channel Setting Pacing Amplitude: 2.4 V
Lead Channel Setting Pacing Pulse Width: 0.4 ms
MDC IDC LEAD IMPLANT DT: 20140508
MDC IDC LEAD LOCATION: 753860
MDC IDC MSMT LEADCHNL RA IMPEDANCE VALUE: 634 Ohm
MDC IDC MSMT LEADCHNL RA PACING THRESHOLD AMPLITUDE: 1 V
MDC IDC MSMT LEADCHNL RV IMPEDANCE VALUE: 634 Ohm
MDC IDC PG IMPLANT DT: 20140508
MDC IDC PG SERIAL: 112392
MDC IDC SESS DTM: 20181128074200
MDC IDC SET LEADCHNL RV SENSING SENSITIVITY: 2.5 mV

## 2017-10-21 DIAGNOSIS — M47816 Spondylosis without myelopathy or radiculopathy, lumbar region: Secondary | ICD-10-CM | POA: Diagnosis not present

## 2017-11-02 ENCOUNTER — Telehealth: Payer: Self-pay

## 2017-11-02 DIAGNOSIS — E782 Mixed hyperlipidemia: Secondary | ICD-10-CM | POA: Diagnosis not present

## 2017-11-02 DIAGNOSIS — R7303 Prediabetes: Secondary | ICD-10-CM | POA: Diagnosis not present

## 2017-11-02 DIAGNOSIS — R609 Edema, unspecified: Secondary | ICD-10-CM | POA: Diagnosis not present

## 2017-11-02 DIAGNOSIS — Z23 Encounter for immunization: Secondary | ICD-10-CM | POA: Diagnosis not present

## 2017-11-02 DIAGNOSIS — I4891 Unspecified atrial fibrillation: Secondary | ICD-10-CM | POA: Diagnosis not present

## 2017-11-02 DIAGNOSIS — F419 Anxiety disorder, unspecified: Secondary | ICD-10-CM | POA: Diagnosis not present

## 2017-11-02 DIAGNOSIS — N183 Chronic kidney disease, stage 3 (moderate): Secondary | ICD-10-CM | POA: Diagnosis not present

## 2017-11-02 DIAGNOSIS — I129 Hypertensive chronic kidney disease with stage 1 through stage 4 chronic kidney disease, or unspecified chronic kidney disease: Secondary | ICD-10-CM | POA: Diagnosis not present

## 2017-11-02 DIAGNOSIS — Z950 Presence of cardiac pacemaker: Secondary | ICD-10-CM | POA: Diagnosis not present

## 2017-11-02 DIAGNOSIS — M545 Low back pain: Secondary | ICD-10-CM | POA: Diagnosis not present

## 2017-11-02 DIAGNOSIS — K219 Gastro-esophageal reflux disease without esophagitis: Secondary | ICD-10-CM | POA: Diagnosis not present

## 2017-11-02 DIAGNOSIS — F3340 Major depressive disorder, recurrent, in remission, unspecified: Secondary | ICD-10-CM | POA: Diagnosis not present

## 2017-11-02 NOTE — Telephone Encounter (Signed)
Received letter from [facility] for recommendations on premedication for patient's upcoming dental appointment. Called office at [phone], informed staff Delana that per Dr. Radford Pax no need for premedication of antibiotics prior to dental appointment. Will fax signed letter to office at [phone]. She verbalized understanding and thanked me for the call.

## 2017-11-03 ENCOUNTER — Ambulatory Visit (INDEPENDENT_AMBULATORY_CARE_PROVIDER_SITE_OTHER): Payer: Medicare Other | Admitting: *Deleted

## 2017-11-03 DIAGNOSIS — M545 Low back pain: Secondary | ICD-10-CM | POA: Diagnosis not present

## 2017-11-03 DIAGNOSIS — Z5181 Encounter for therapeutic drug level monitoring: Secondary | ICD-10-CM

## 2017-11-03 DIAGNOSIS — I481 Persistent atrial fibrillation: Secondary | ICD-10-CM

## 2017-11-03 DIAGNOSIS — I4891 Unspecified atrial fibrillation: Secondary | ICD-10-CM | POA: Diagnosis not present

## 2017-11-03 DIAGNOSIS — G894 Chronic pain syndrome: Secondary | ICD-10-CM | POA: Diagnosis not present

## 2017-11-03 DIAGNOSIS — I4819 Other persistent atrial fibrillation: Secondary | ICD-10-CM

## 2017-11-03 LAB — POCT INR: INR: 2.7

## 2017-11-03 NOTE — Patient Instructions (Signed)
Description   Continue same dose 1/2 tablet everyday except 1 tablet on Mondays, Wednesdays and Fridays.   Recheck INR in 6 weeks.

## 2017-12-07 ENCOUNTER — Other Ambulatory Visit: Payer: Self-pay

## 2017-12-07 ENCOUNTER — Ambulatory Visit (HOSPITAL_COMMUNITY): Payer: Medicare Other | Attending: Cardiology

## 2017-12-07 DIAGNOSIS — I493 Ventricular premature depolarization: Secondary | ICD-10-CM | POA: Insufficient documentation

## 2017-12-07 DIAGNOSIS — I481 Persistent atrial fibrillation: Secondary | ICD-10-CM | POA: Diagnosis not present

## 2017-12-07 DIAGNOSIS — R0602 Shortness of breath: Secondary | ICD-10-CM | POA: Insufficient documentation

## 2017-12-07 DIAGNOSIS — I340 Nonrheumatic mitral (valve) insufficiency: Secondary | ICD-10-CM | POA: Diagnosis not present

## 2017-12-07 DIAGNOSIS — I080 Rheumatic disorders of both mitral and aortic valves: Secondary | ICD-10-CM | POA: Diagnosis not present

## 2017-12-07 DIAGNOSIS — I420 Dilated cardiomyopathy: Secondary | ICD-10-CM | POA: Diagnosis not present

## 2017-12-12 NOTE — Progress Notes (Signed)
Cardiology Office Note:    Date:  12/13/2017   ID:  Molly [name], DOB Jul 16, 1933, MRN [id number]  PCP:  Antony Contras, MD  Cardiologist:  No primary care provider on file.    Referring MD: Antony Contras, MD   Chief Complaint  Patient presents with  . Atrial Fibrillation  . Cardiomyopathy  . Mitral Regurgitation    History of Present Illness:    Molly [name] is a 84 y.o. female with a hx of persistent atrial fibrillation with CHADS2VASC score of 5 on chronic anticoagulation, DCM with EF 40-45% by echo, severe MR secondary to annular dilatation, tachybrady syndrome s/p PPM.  Her last echo done this month showed an EF of 40-45% with mild to moderate MR.  She is followed by Dr. [name] for her afib and pacer.  She saw him 07/2017.  She is very symptomatic with her atrial arrhythmias and has failed Amio and sotolol.  Dr. [name] recommended Tikosyn load but she has not followed through yet.    She is here today for followup and is doing well.  She denies any chest pain or pressure, PND, orthopnea, dizziness, or syncope. She has chronic DOE which is stable and not gotten any worse.  She has chronic LE edema which is stable.  She cannot get the compression hose on.  She is compliant with her meds and is tolerating meds with no SE.    Past Medical History:  Diagnosis Date  . Aortic insufficiency    mild by echo 2017  . Cancer ([place])    Skin cancer- basal.  1 mylenoma  . Complication of anesthesia   . DCM (dilated cardiomyopathy) ([place])    EF 40-45% by echo 2017 - nuclear stress test with no ischemia  . GERD (gastroesophageal reflux disease)   . H/O benign essential tremor    on amiodarone resolved off amio  . H/O hyperkalemia    Resolved off ACE inhibitors  . High cholesterol   . History of blood transfusion   . Hyperparathyroidism ([place])   . Hypertension   . Mitral regurgitation    mild to moderate by echo 2017  . Persistent atrial fibrillation ([place])   . PONV (postoperative  nausea and vomiting)   . PVC's (premature ventricular contractions)   . Renal insufficiency    MILD  . Shortness of breath    with exertion  . Tachycardia-bradycardia syndrome [facility])    s/p PPM 02/2013    Past Surgical History:  Procedure Laterality Date  . AMPUTATION Right 11/27/2014   Procedure: RIGHT 2ND AND 3RD TOE AMPUTATION;  Surgeon: Wylene Simmer, MD;  Location: [facility];  Service: Orthopedics;  Laterality: Right;  . BUNIONECTOMY     Right  . CARDIAC CATHETERIZATION  09/25/2000   EF of 50% -- with normal left ventricular size and function  . CESAREAN SECTION    . CESAREAN SECTION    . EYE SURGERY Bilateral    Cataract  . FOOT SURGERY    . INSERT / REPLACE / REMOVE PACEMAKER  02/2013  . PACEMAKER INSERTION  02/2013  . PERMANENT PACEMAKER INSERTION N/A 03/02/2013   Procedure: PERMANENT PACEMAKER INSERTION;  Surgeon: Evans Lance, MD;  Location: [facility] CATH LAB;  Service: Cardiovascular;  Laterality: N/A;  . SHOULDER ARTHROSCOPY W/ ROTATOR CUFF REPAIR    . SHOULDER SURGERY Right    roto cuff  . TOE AMPUTATION Left    2nd and 3rd, bunion under toes.  . TONSILLECTOMY    .  VARICOSE VEIN SURGERY      Current Medications: Current Meds  Medication Sig  . acetaminophen (TYLENOL) 500 MG tablet Take 1,000 mg by mouth every 6 (six) hours as needed. For pain  . atorvastatin (LIPITOR) 10 MG tablet Take 10 mg by mouth every evening.  . carvedilol (COREG) 3.125 MG tablet Take 1 tablet (3.125 mg total) by mouth 2 (two) times daily with a meal.  . dorzolamide (TRUSOPT) 2 % ophthalmic solution Place 1 drop into the right eye 2 (two) times daily.  Marland Kitchen LORazepam (ATIVAN) 0.5 MG tablet Take 0.5 tablets by mouth at bedtime as needed.  Marland Kitchen losartan (COZAAR) 25 MG tablet Take 0.5 tablets (12.5 mg total) by mouth daily.  . Multiple Vitamin (MULTIVITAMIN WITH MINERALS) TABS Take 1 tablet by mouth daily.  Marland Kitchen omeprazole (PRILOSEC) 20 MG capsule Take 20 mg by mouth daily.  . sotalol (BETAPACE) 80 MG tablet Take  1 tablet (80 mg total) by mouth 2 (two) times daily.  Marland Kitchen spironolactone (ALDACTONE) 25 MG tablet TAKE 1/2 TABLET ONCE A DAY  . warfarin (COUMADIN) 5 MG tablet TAKE 1/2 TO 1 TABLET DAILY AS DIRECTED BY ANTICOAGULATION CLINIC     Allergies:   Codeine and Tramadol   Social History   Socioeconomic History  . Marital status: Married    Spouse name: None  . Number of children: None  . Years of education: None  . Highest education level: None  Social Needs  . Financial resource strain: None  . Food insecurity - worry: None  . Food insecurity - inability: None  . Transportation needs - medical: None  . Transportation needs - non-medical: None  Occupational History  . None  Tobacco Use  . Smoking status: Never Smoker  . Smokeless tobacco: Never Used  Substance and Sexual Activity  . Alcohol use: No  . Drug use: No  . Sexual activity: None  Other Topics Concern  . None  Social History Narrative  . None     Family History: The patient's family history includes Hypertension in her father and mother.  ROS:   Please see the history of present illness.    ROS  All other systems reviewed and negative.   EKGs/Labs/Other Studies Reviewed:    The following studies were reviewed today: none  EKG:  EKG is not ordered today.    Recent Labs: 02/23/2017: ALT 13; Hemoglobin 12.9; Magnesium 1.9; Platelets 214; TSH 3.115 03/01/2017: BUN 20; Creatinine, Ser 1.02; Potassium 4.9; Sodium 139   Recent Lipid Panel    Component Value Date/Time   CHOL 156 05/23/2012 0514   TRIG 86 05/23/2012 0514   HDL 80 05/23/2012 0514   CHOLHDL 2.0 05/23/2012 0514   VLDL 17 05/23/2012 0514   LDLCALC 59 05/23/2012 0514    Physical Exam:    VS:  BP (!) 147/89   Pulse 75   Ht 5' 5.5" (1.664 m)   Wt 190 lb (86.2 kg)   SpO2 97%   BMI 31.14 kg/m     Wt Readings from Last 3 Encounters:  12/13/17 190 lb (86.2 kg)  08/03/17 187 lb (84.8 kg)  07/26/17 188 lb (85.3 kg)     GEN:  Well nourished, well  developed in no acute distress HEENT: Normal NECK: No JVD; No carotid bruits LYMPHATICS: No lymphadenopathy CARDIAC: RRR, no murmurs, rubs, gallops RESPIRATORY:  Clear to auscultation without rales, wheezing or rhonchi  ABDOMEN: Soft, non-tender, non-distended MUSCULOSKELETAL:  No edema; No deformity  SKIN: Warm and dry  NEUROLOGIC:  Alert and oriented x 3 PSYCHIATRIC:  Normal affect   ASSESSMENT:    1. Persistent atrial fibrillation ([place])   2. Essential hypertension   3. PVC (premature ventricular contraction)   4. Tachycardia-bradycardia syndrome ([place])   5. Non-rheumatic mitral regurgitation   6. DCM (dilated cardiomyopathy) ([place])    PLAN:    In order of problems listed above:  1.  Persistent atrial fibrillation - she is maintaining NSR but has had problems with breakthrough on Sotolol.  At her last OV with Dr. [name] last fall he recommended changing to Tikosyn but she has been heistant.  She will continue on warfarin followed in our coumadin clinic. We discussed the treatment options and she does not want to pursue Tikosyn at this time due to cost.  She thinks her afib episodes are not that frequent and she feels good now so she does not want to increase sotolol at this time due to risk of more fatigue.  She will let me know if her afib becomes more problematic.    2.  HTN - her BP is borderline controlled on exam today.  She will continue on spironolactone, losartan and BB.  At home her BP is normal.  3.  PVCs - suppressed on Sotolol  4.  Tachybrady syndrome s/p PPM followed in device clinic by Dr. [name]  5.  Mild to moderate MR by recent echo  6.  NIDCM thought possibly due to tachycardia mediated CM.  Her EF has improved to 40-45%.  She will continue on carvedilol 3.125mg  BID, Losartan 12.5mg  daily and aldactone 25mg  daily.  I will check a BMET.   Medication Adjustments/Labs and Tests Ordered: Current medicines are reviewed at length with the patient today.  Concerns  regarding medicines are outlined above.  No orders of the defined types were placed in this encounter.  No orders of the defined types were placed in this encounter.   Signed, Fransico Him, MD  12/13/2017 2:18 PM    [place]

## 2017-12-13 ENCOUNTER — Ambulatory Visit (INDEPENDENT_AMBULATORY_CARE_PROVIDER_SITE_OTHER): Payer: Medicare Other | Admitting: *Deleted

## 2017-12-13 ENCOUNTER — Encounter: Payer: Self-pay | Admitting: Cardiology

## 2017-12-13 ENCOUNTER — Ambulatory Visit (INDEPENDENT_AMBULATORY_CARE_PROVIDER_SITE_OTHER): Payer: Medicare Other | Admitting: Cardiology

## 2017-12-13 ENCOUNTER — Other Ambulatory Visit (HOSPITAL_COMMUNITY): Payer: Medicare Other

## 2017-12-13 VITALS — BP 147/89 | HR 75 | Ht 65.5 in | Wt 190.0 lb

## 2017-12-13 DIAGNOSIS — I420 Dilated cardiomyopathy: Secondary | ICD-10-CM

## 2017-12-13 DIAGNOSIS — I493 Ventricular premature depolarization: Secondary | ICD-10-CM | POA: Diagnosis not present

## 2017-12-13 DIAGNOSIS — I10 Essential (primary) hypertension: Secondary | ICD-10-CM | POA: Diagnosis not present

## 2017-12-13 DIAGNOSIS — Z5181 Encounter for therapeutic drug level monitoring: Secondary | ICD-10-CM | POA: Diagnosis not present

## 2017-12-13 DIAGNOSIS — I481 Persistent atrial fibrillation: Secondary | ICD-10-CM

## 2017-12-13 DIAGNOSIS — I495 Sick sinus syndrome: Secondary | ICD-10-CM

## 2017-12-13 DIAGNOSIS — I340 Nonrheumatic mitral (valve) insufficiency: Secondary | ICD-10-CM | POA: Diagnosis not present

## 2017-12-13 DIAGNOSIS — I4819 Other persistent atrial fibrillation: Secondary | ICD-10-CM

## 2017-12-13 DIAGNOSIS — I4891 Unspecified atrial fibrillation: Secondary | ICD-10-CM

## 2017-12-13 LAB — POCT INR: INR: 3.5

## 2017-12-13 NOTE — Patient Instructions (Signed)
Medication Instructions:  Your physician recommends that you continue on your current medications as directed. Please refer to the Current Medication list given to you today.  If you need a refill on your cardiac medications, please contact your pharmacy first.  Labwork: None ordered   Testing/Procedures: None ordered   Follow-Up: Your physician wants you to follow-up in: 6 months with Dr. Turner. You will receive a reminder letter in the mail two months in advance. If you don't receive a letter, please call our office to schedule the follow-up appointment.  Any Other Special Instructions Will Be Listed Below (If Applicable).   Thank you for choosing [facility]    Rena Rashida Ladouceur, RN  [phone]  If you need a refill on your cardiac medications before your next appointment, please call your pharmacy.   

## 2017-12-13 NOTE — Patient Instructions (Signed)
Description   Do not take coumadin today Feb 18th then continue same dose 1/2 tablet everyday except 1 tablet on Mondays, Wednesdays and Fridays.   Recheck INR in 2 weeks.

## 2017-12-22 ENCOUNTER — Ambulatory Visit (INDEPENDENT_AMBULATORY_CARE_PROVIDER_SITE_OTHER): Payer: Medicare Other | Admitting: *Deleted

## 2017-12-22 DIAGNOSIS — I495 Sick sinus syndrome: Secondary | ICD-10-CM

## 2017-12-22 NOTE — Progress Notes (Signed)
Remote pacemaker transmission.   

## 2017-12-23 ENCOUNTER — Encounter: Payer: Self-pay | Admitting: Cardiology

## 2017-12-27 ENCOUNTER — Telehealth: Payer: Self-pay | Admitting: Cardiology

## 2017-12-27 NOTE — Telephone Encounter (Signed)
Spoke with pt informed her that she was AF at time of transmission, pt stated that she felt like has been in and out of AF for a week now. Pt stated that she didn't know if she needed an apt with Dr.Taylor or not, informed her that she has an apt tomorrow with Richardson Dopp at 9:45am and if he had any questions Dr. [name] was in the office and Nicki Reaper could speak with Dr. [name] is needed. Pt voiced understanding.

## 2017-12-27 NOTE — Telephone Encounter (Signed)
Returned call to patient. Patient c/o dizziness, malaise and hypotension for the past week. Her SBP has been running around 100 and HR 90-100s. BP this am before meds was 88/68 HR 98, at 10 AM BP 99/77 HR 98 after meds. Patient rechecked BP again at 11AM BP 106/67 HR 99. Patient stated she took sotalol 80 mg, coreg 3.125 mg and spironolactone 12.5 mg. She did not take her losartan this am. Patient denies, chest pain, syncope or sob. Made an appointment with Richardson Dopp, PA on 12/28/17 (first available)  and informed patient I would send to Dr. Radford Pax for further recommendations. Patient also states she sent in a a remote report on Wednesday 12/23/17 and she felt like she was in afib at the time but has not heard anything back. Informed her I would forward to device clinic to follow up. She verbalized understanding and thankful for the call.

## 2017-12-27 NOTE — Telephone Encounter (Signed)
New Message   STAT if HR is under 50 or over 120 (normal HR is 60-100 beats per minute)  1) What is your heart rate? 98  2) Do you have a log of your heart rate readings (document readings)? no  3) Do you have any other symptoms? BP has been low 88/68 as well as dizziness    Pt c/o BP issue:  1. What are your last 5 BP readings? 88/68 2. Are you having any other symptoms (ex. Dizziness, headache, blurred vision, passed out) DIZZINESS   3. What is your medication issue? None

## 2017-12-28 ENCOUNTER — Encounter (HOSPITAL_COMMUNITY)
Admission: RE | Disposition: A | Discharge status: Home / Self Care | Payer: Self-pay | Source: Ambulatory Visit | Attending: Cardiology

## 2017-12-28 ENCOUNTER — Ambulatory Visit (HOSPITAL_COMMUNITY): Payer: Medicare Other | Admitting: Anesthesiology

## 2017-12-28 ENCOUNTER — Encounter (HOSPITAL_COMMUNITY): Payer: Self-pay | Admitting: *Deleted

## 2017-12-28 ENCOUNTER — Other Ambulatory Visit: Payer: Self-pay

## 2017-12-28 ENCOUNTER — Ambulatory Visit (INDEPENDENT_AMBULATORY_CARE_PROVIDER_SITE_OTHER): Payer: Medicare Other | Admitting: Pharmacist

## 2017-12-28 ENCOUNTER — Ambulatory Visit (HOSPITAL_COMMUNITY)
Admission: RE | Admit: 2017-12-28 | Discharge: 2017-12-28 | Disposition: A | Discharge status: Home / Self Care | Payer: Medicare Other | Source: Ambulatory Visit | Attending: Cardiology | Admitting: Cardiology

## 2017-12-28 ENCOUNTER — Encounter: Payer: Self-pay | Admitting: Physician Assistant

## 2017-12-28 ENCOUNTER — Ambulatory Visit (INDEPENDENT_AMBULATORY_CARE_PROVIDER_SITE_OTHER): Payer: Medicare Other | Admitting: Physician Assistant

## 2017-12-28 VITALS — BP 130/64 | HR 119 | Ht 65.5 in | Wt 188.4 lb

## 2017-12-28 DIAGNOSIS — Z89429 Acquired absence of other toe(s), unspecified side: Secondary | ICD-10-CM | POA: Insufficient documentation

## 2017-12-28 DIAGNOSIS — Z79899 Other long term (current) drug therapy: Secondary | ICD-10-CM | POA: Diagnosis not present

## 2017-12-28 DIAGNOSIS — I130 Hypertensive heart and chronic kidney disease with heart failure and stage 1 through stage 4 chronic kidney disease, or unspecified chronic kidney disease: Secondary | ICD-10-CM | POA: Diagnosis not present

## 2017-12-28 DIAGNOSIS — I4891 Unspecified atrial fibrillation: Secondary | ICD-10-CM

## 2017-12-28 DIAGNOSIS — Z5181 Encounter for therapeutic drug level monitoring: Secondary | ICD-10-CM

## 2017-12-28 DIAGNOSIS — Z885 Allergy status to narcotic agent status: Secondary | ICD-10-CM | POA: Diagnosis not present

## 2017-12-28 DIAGNOSIS — Z7901 Long term (current) use of anticoagulants: Secondary | ICD-10-CM | POA: Diagnosis not present

## 2017-12-28 DIAGNOSIS — I480 Paroxysmal atrial fibrillation: Secondary | ICD-10-CM | POA: Diagnosis not present

## 2017-12-28 DIAGNOSIS — N189 Chronic kidney disease, unspecified: Secondary | ICD-10-CM | POA: Diagnosis not present

## 2017-12-28 DIAGNOSIS — Z950 Presence of cardiac pacemaker: Secondary | ICD-10-CM | POA: Insufficient documentation

## 2017-12-28 DIAGNOSIS — E7800 Pure hypercholesterolemia, unspecified: Secondary | ICD-10-CM | POA: Insufficient documentation

## 2017-12-28 DIAGNOSIS — I420 Dilated cardiomyopathy: Secondary | ICD-10-CM

## 2017-12-28 DIAGNOSIS — I481 Persistent atrial fibrillation: Secondary | ICD-10-CM | POA: Insufficient documentation

## 2017-12-28 DIAGNOSIS — I340 Nonrheumatic mitral (valve) insufficiency: Secondary | ICD-10-CM | POA: Insufficient documentation

## 2017-12-28 DIAGNOSIS — I5042 Chronic combined systolic (congestive) and diastolic (congestive) heart failure: Secondary | ICD-10-CM | POA: Insufficient documentation

## 2017-12-28 DIAGNOSIS — I4819 Other persistent atrial fibrillation: Secondary | ICD-10-CM

## 2017-12-28 DIAGNOSIS — Z85828 Personal history of other malignant neoplasm of skin: Secondary | ICD-10-CM | POA: Diagnosis not present

## 2017-12-28 DIAGNOSIS — Z888 Allergy status to other drugs, medicaments and biological substances status: Secondary | ICD-10-CM | POA: Diagnosis not present

## 2017-12-28 DIAGNOSIS — K219 Gastro-esophageal reflux disease without esophagitis: Secondary | ICD-10-CM | POA: Diagnosis not present

## 2017-12-28 HISTORY — PX: CARDIOVERSION: SHX1299

## 2017-12-28 LAB — POCT I-STAT, CHEM 8
BUN: 28 mg/dL — ABNORMAL HIGH (ref 6–20)
Calcium, Ion: 1.41 mmol/L — ABNORMAL HIGH (ref 1.15–1.40)
Chloride: 108 mmol/L (ref 101–111)
Creatinine, Ser: 1.3 mg/dL — ABNORMAL HIGH (ref 0.44–1.00)
Glucose, Bld: 111 mg/dL — ABNORMAL HIGH (ref 65–99)
HCT: 37 % (ref 36.0–46.0)
Hemoglobin: 12.6 g/dL (ref 12.0–15.0)
Potassium: 4.8 mmol/L (ref 3.5–5.1)
Sodium: 141 mmol/L (ref 135–145)
TCO2: 22 mmol/L (ref 22–32)

## 2017-12-28 LAB — POCT INR: INR: 3

## 2017-12-28 SURGERY — CARDIOVERSION
Anesthesia: General

## 2017-12-28 MED ORDER — HYDROCORTISONE 1 % EX CREA: 1.0000 "application " | TOPICAL_CREAM | Freq: Three times a day (TID) | CUTANEOUS | Status: DC | PRN

## 2017-12-28 MED ORDER — PHENYLEPHRINE HCL 10 MG/ML IJ SOLN
INTRAMUSCULAR | Status: DC | PRN
  Administered 2017-12-28: 80 ug via INTRAVENOUS

## 2017-12-28 MED ORDER — SODIUM CHLORIDE 0.9% FLUSH: 3.0000 mL | INTRAVENOUS | Status: DC | PRN

## 2017-12-28 MED ORDER — SODIUM CHLORIDE 0.9 % IV SOLN: 250.0000 mL | INTRAVENOUS | Status: DC

## 2017-12-28 MED ORDER — PROPOFOL 10 MG/ML IV BOLUS
INTRAVENOUS | Status: DC | PRN
  Administered 2017-12-28: 50 mg via INTRAVENOUS

## 2017-12-28 MED ORDER — SODIUM CHLORIDE 0.9% FLUSH: 3.0000 mL | Freq: Two times a day (BID) | INTRAVENOUS | Status: DC

## 2017-12-28 MED ORDER — SODIUM CHLORIDE 0.9 % IV SOLN
INTRAVENOUS | Status: DC
  Administered 2017-12-28: 13:00:00 via INTRAVENOUS

## 2017-12-28 MED ORDER — LIDOCAINE HCL (CARDIAC) 20 MG/ML IV SOLN
INTRAVENOUS | Status: DC | PRN
  Administered 2017-12-28: 60 mg via INTRAVENOUS

## 2017-12-28 NOTE — CV Procedure (Signed)
    Cardioversion Note  Molly [name] [id number] [birth year]  Procedure: DC Cardioversion Indications: DCCV  Procedure Details Consent: Obtained Time Out: Verified patient identification, verified procedure, site/side was marked, verified correct patient position, special equipment/implants available, Radiology Safety Procedures followed,  medications/allergies/relevent history reviewed, required imaging and test results available.  Performed  The patient has been on adequate anticoagulation.  The patient received IV propofol administered by anesthesia staff for sedation.  Synchronous cardioversion was performed at 120 joules.  The cardioversion was successful.    Complications: No apparent complications Patient did tolerate procedure well.   [name], MD, [facility] 12/28/2017, 2:02 PM

## 2017-12-28 NOTE — Patient Instructions (Signed)
YOU HAVE BEEN SCHEDULED FOR A CARDIOVERSION TO BE DONE TODAY PER SCOTT WEAVER, [place] .Marland Kitchen   YOU WILL GO TO ADMITTING AT [facility]

## 2017-12-28 NOTE — H&P (View-Only) (Signed)
Cardiology Office Note:    Date:  12/28/2017   ID:  Molly [name], DOB 04/11/1933, MRN [id number]  PCP:  Antony Contras, MD  Cardiologist:  Fransico Him, MD  Electrophysiologist: Dr. [name]    Referring MD: Antony Contras, MD   Chief Complaint  Patient presents with  . Dizziness    History of Present Illness:    Molly [name] is a 84 y.o. female with chronic systolic heart failure, persistent atrial fibrillation, hypertension, chronic kidney disease, valvular heart disease, tachybradycardia syndrome status post pacemaker.  She was admitted in May 2018 with atrial fibrillation with rapid ventricular rate with associated reduced LV function (EF 20-25).  Her worsening cardiomyopathy was felt to be related to tachycardia.  EF has improved with most recent echo 12/07/17 demonstrating EF 40-45.  She had mild to moderate mitral regurgitation at that time.  She has been maintained on sotalol and warfarin.  She had difficulty with recurrent symptomatic atrial fibrillation.  She felt worse on higher doses of sotalol. Dr. [name] recommended admission for dofetilide however, she declined due to cost..  She was last seen by Dr. Radford Pax 12/13/17.  She felt that her atrial fibrillation was fairly well controlled at that time.  She called in yesterday with symptomatic hypotension.  Recent remote check did demonstrate recurrence of atrial fibrillation.    Molly [name] returns for evaluation of dizziness.  She is here alone. She has felt that she has been in atrial fibrillation since last Monday.  She has been dizzy and near syncopal.  She denies frank syncope.  She denies chest heaviness or pressure.  She has been short of breath with activity. She denies paroxysmal nocturnal dyspnea, significant swelling.  She denies any recent illnesses, vomiting, diarrhea, bleeding issues.    Prior CV studies:   The following studies were reviewed today:  Echo 12/07/17 Mild focal basal septal hypertrophy, EF 40-45,  mild diffuse HK, grade 2 diastolic dysfunction, mild to moderate MR, severe LAE  Echo 05/10/17  EF 35-40, diffuse HK, grade 2 diastolic dysfunction, trivial AI, moderate MR, mild LAE, moderate reduced RVSF, moderate TR, PASP 45  Echo 02/23/17 Mild LVH, EF 20-25, diffuse HK, mild AI, MAC, severe MR, severe LAE, mild RVE, moderate RAE, moderate TR, PASP 58  Echo 12/21/16 Mild concentric LVH, EF 40-45, mild diffuse HK, trivial AI, mild to moderate MR, mild BAE, moderate TR, PASP 35  Nuclear stress test 12/31/15 No ischemia, not gated, low risk  Echo 12/11/15 EF 40-45, mild AI, mild to moderate MR  Carotid US 05/23/12 No significant extracranial carotid artery stenosis demonstrated.   Cardiac catheterization 09/2000 Normal coronary arteries EF 50  Past Medical History:  Diagnosis Date  . Aortic insufficiency    mild by echo 2017  . Cancer ([facility])    Skin cancer- basal.  1 mylenoma  . Complication of anesthesia   . DCM (dilated cardiomyopathy) ([place])    EF 40-45% by echo 2017 - nuclear stress test with no ischemia  . GERD (gastroesophageal reflux disease)   . H/O benign essential tremor    on amiodarone resolved off amio  . H/O hyperkalemia    Resolved off ACE inhibitors  . High cholesterol   . History of blood transfusion   . Hyperparathyroidism ([place])   . Hypertension   . Mitral regurgitation    mild to moderate by echo 2017  . Persistent atrial fibrillation ([place])   . PONV (postoperative nausea and vomiting)   . PVC's (premature ventricular  contractions)   . Renal insufficiency    MILD  . Shortness of breath    with exertion  . Tachycardia-bradycardia syndrome [facility])    s/p PPM 02/2013    Past Surgical History:  Procedure Laterality Date  . AMPUTATION Right 11/27/2014   Procedure: RIGHT 2ND AND 3RD TOE AMPUTATION;  Surgeon: Wylene Simmer, MD;  Location: [facility];  Service: Orthopedics;  Laterality: Right;  . BUNIONECTOMY     Right  . CARDIAC CATHETERIZATION  09/25/2000   EF of  50% -- with normal left ventricular size and function  . CESAREAN SECTION    . CESAREAN SECTION    . EYE SURGERY Bilateral    Cataract  . FOOT SURGERY    . INSERT / REPLACE / REMOVE PACEMAKER  02/2013  . PACEMAKER INSERTION  02/2013  . PERMANENT PACEMAKER INSERTION N/A 03/02/2013   Procedure: PERMANENT PACEMAKER INSERTION;  Surgeon: Evans Lance, MD;  Location: [facility] CATH LAB;  Service: Cardiovascular;  Laterality: N/A;  . SHOULDER ARTHROSCOPY W/ ROTATOR CUFF REPAIR    . SHOULDER SURGERY Right    roto cuff  . TOE AMPUTATION Left    2nd and 3rd, bunion under toes.  . TONSILLECTOMY    . VARICOSE VEIN SURGERY      Current Medications: Current Meds  Medication Sig  . acetaminophen (TYLENOL) 500 MG tablet Take 1,000 mg by mouth every 6 (six) hours as needed. For pain  . atorvastatin (LIPITOR) 10 MG tablet Take 10 mg by mouth every evening.  . carvedilol (COREG) 3.125 MG tablet Take 1 tablet (3.125 mg total) by mouth 2 (two) times daily with a meal.  . dorzolamide (TRUSOPT) 2 % ophthalmic solution Place 1 drop into the right eye 2 (two) times daily.  Marland Kitchen LORazepam (ATIVAN) 0.5 MG tablet Take 0.5 tablets by mouth at bedtime as needed.  Marland Kitchen losartan (COZAAR) 25 MG tablet Take 0.5 tablets (12.5 mg total) by mouth daily.  . Multiple Vitamin (MULTIVITAMIN WITH MINERALS) TABS Take 1 tablet by mouth daily.  Marland Kitchen omeprazole (PRILOSEC) 20 MG capsule Take 20 mg by mouth daily.  . sotalol (BETAPACE) 80 MG tablet Take 1 tablet (80 mg total) by mouth 2 (two) times daily.  Marland Kitchen spironolactone (ALDACTONE) 25 MG tablet TAKE 1/2 TABLET ONCE A DAY  . warfarin (COUMADIN) 5 MG tablet TAKE 1/2 TO 1 TABLET DAILY AS DIRECTED BY ANTICOAGULATION CLINIC     Allergies:   Codeine and Tramadol   Social History   Tobacco Use  . Smoking status: Never Smoker  . Smokeless tobacco: Never Used  Substance Use Topics  . Alcohol use: No  . Drug use: No     Family Hx: The patient's family history includes Hypertension in her  father and mother.  ROS:   Please see the history of present illness.    Review of Systems  Cardiovascular: Positive for irregular heartbeat and leg swelling.  Respiratory: Positive for shortness of breath.   Hematologic/Lymphatic: Bruises/bleeds easily.  Musculoskeletal: Positive for back pain.  Neurological: Positive for dizziness.   All other systems reviewed and are negative.   EKGs/Labs/Other Test Reviewed:    EKG:  EKG is  ordered today.  The ekg ordered today demonstrates atrial fibrillation, heart rate 119, interventricular conduction delay  Recent Labs: 02/23/2017: ALT 13; Hemoglobin 12.9; Magnesium 1.9; Platelets 214; TSH 3.115 03/01/2017: BUN 20; Creatinine, Ser 1.02; Potassium 4.9; Sodium 139   Lab Results  Component Value Date   INR 3.0 12/28/2017   INR  3.5 12/13/2017   INR 2.7 11/03/2017     Recent Lipid Panel Lab Results  Component Value Date/Time   CHOL 156 05/23/2012 05:14 AM   TRIG 86 05/23/2012 05:14 AM   HDL 80 05/23/2012 05:14 AM   CHOLHDL 2.0 05/23/2012 05:14 AM   LDLCALC 59 05/23/2012 05:14 AM    Physical Exam:    VS:  BP 130/64   Pulse (!) 119   Ht 5' 5.5" (1.664 m)   Wt 188 lb 6.4 oz (85.5 kg)   SpO2 94%   BMI 30.87 kg/m    Orthostatic VS for the past 24 hrs (Last 3 readings):  BP- Lying Pulse- Lying BP- Sitting Pulse- Sitting BP- Standing at 0 minutes Pulse- Standing at 0 minutes BP- Standing at 3 minutes Pulse- Standing at 3 minutes  12/28/17 0945 130/64 88 122/78 88 118/80 78 132/74 71      Wt Readings from Last 3 Encounters:  12/28/17 188 lb 6.4 oz (85.5 kg)  12/13/17 190 lb (86.2 kg)  08/03/17 187 lb (84.8 kg)     Physical Exam  Constitutional: She is oriented to person, place, and time. She appears well-developed and well-nourished. No distress.  HENT:  Head: Normocephalic and atraumatic.  Neck: No JVD present.  Cardiovascular: An irregularly irregular rhythm present. Tachycardia present.  No murmur heard. Pulmonary/Chest:  Effort normal. She has no rales.  Abdominal: Soft.  Musculoskeletal: She exhibits no edema.  Neurological: She is alert and oriented to person, place, and time.  Skin: Skin is warm and dry.    ASSESSMENT & PLAN:    #1.  Atrial fibrillation with RVR ([place]) She presents with recurrent atrial fibrillation with rapid ventricular rate.  She is significantly symptomatic.  Her INR has been therapeutic for the last few months.  INR today is 3.0.  I reviewed her case with Dr. [name].  We will send her to the hospital today for cardioversion to restore normal sinus rhythm.  She will follow-up with Dr. [name] in 2 weeks to discuss further options for rhythm control (i.e admission for dofetilide versus AV nodal ablation).  -Continue current dose of sotalol, carvedilol  -Arrange cardioversion today  #2.  DCM (dilated cardiomyopathy) (HCC) Nonischemic cardiomyopathy.  She had worsening LV function in 2018 in the setting of rapid ventricular rate felt to be related to tachycardia.  Continue beta-blocker, ARB, spironolactone.  #3.  Chronic combined systolic and diastolic heart failure (HCC) NYHA 2 b-3.  Volume appears stable on exam.  Continue current dose of beta-blocker, ARB, Spironolactone.  #4.  Non-rheumatic mitral regurgitation Mild to moderate mitral regurgitation by most recent echocardiogram.  #5.  Pacemaker Continue follow-up with EP as planned.   Dispo:  Return in about 2 weeks (around 01/11/2018) for Post Procedure Follow Up with Dr. [name].   Medication Adjustments/Labs and Tests Ordered: Current medicines are reviewed at length with the patient today.  Concerns regarding medicines are outlined above.  Tests Ordered: Orders Placed This Encounter  Procedures  . EKG 12-Lead   Medication Changes: No orders of the defined types were placed in this encounter.   Signed, Richardson Dopp, PA-C  12/28/2017 10:21 AM    [address] [place], [place], [place]   [address] Phone: [phone]; Fax: [phone]

## 2017-12-28 NOTE — Interval H&P Note (Signed)
History and Physical Interval Note:  12/28/2017 11:19 AM  Molly [name]  has presented today for surgery, with the diagnosis of afib with RVR  The various methods of treatment have been discussed with the patient and family. After consideration of risks, benefits and other options for treatment, the patient has consented to  Procedure(s): CARDIOVERSION (N/A) as a surgical intervention .  The patient's history has been reviewed, patient examined, no change in status, stable for surgery.  I have reviewed the patient's chart and labs.  Questions were answered to the patient's satisfaction.     [name]

## 2017-12-28 NOTE — Progress Notes (Signed)
Cardiology Office Note:    Date:  12/28/2017   ID:  Molly [name], DOB 04/11/1933, MRN [id number]  PCP:  Antony Contras, MD  Cardiologist:  Fransico Him, MD  Electrophysiologist: Dr. [name]    Referring MD: Antony Contras, MD   Chief Complaint  Patient presents with  . Dizziness    History of Present Illness:    Molly [name] is a 84 y.o. female with chronic systolic heart failure, persistent atrial fibrillation, hypertension, chronic kidney disease, valvular heart disease, tachybradycardia syndrome status post pacemaker.  She was admitted in May 2018 with atrial fibrillation with rapid ventricular rate with associated reduced LV function (EF 20-25).  Her worsening cardiomyopathy was felt to be related to tachycardia.  EF has improved with most recent echo 12/07/17 demonstrating EF 40-45.  She had mild to moderate mitral regurgitation at that time.  She has been maintained on sotalol and warfarin.  She had difficulty with recurrent symptomatic atrial fibrillation.  She felt worse on higher doses of sotalol. Dr. [name] recommended admission for dofetilide however, she declined due to cost..  She was last seen by Dr. Radford Pax 12/13/17.  She felt that her atrial fibrillation was fairly well controlled at that time.  She called in yesterday with symptomatic hypotension.  Recent remote check did demonstrate recurrence of atrial fibrillation.    Molly [name] returns for evaluation of dizziness.  She is here alone. She has felt that she has been in atrial fibrillation since last Monday.  She has been dizzy and near syncopal.  She denies frank syncope.  She denies chest heaviness or pressure.  She has been short of breath with activity. She denies paroxysmal nocturnal dyspnea, significant swelling.  She denies any recent illnesses, vomiting, diarrhea, bleeding issues.    Prior CV studies:   The following studies were reviewed today:  Echo 12/07/17 Mild focal basal septal hypertrophy, EF 40-45,  mild diffuse HK, grade 2 diastolic dysfunction, mild to moderate MR, severe LAE  Echo 05/10/17  EF 35-40, diffuse HK, grade 2 diastolic dysfunction, trivial AI, moderate MR, mild LAE, moderate reduced RVSF, moderate TR, PASP 45  Echo 02/23/17 Mild LVH, EF 20-25, diffuse HK, mild AI, MAC, severe MR, severe LAE, mild RVE, moderate RAE, moderate TR, PASP 58  Echo 12/21/16 Mild concentric LVH, EF 40-45, mild diffuse HK, trivial AI, mild to moderate MR, mild BAE, moderate TR, PASP 35  Nuclear stress test 12/31/15 No ischemia, not gated, low risk  Echo 12/11/15 EF 40-45, mild AI, mild to moderate MR  Carotid US 05/23/12 No significant extracranial carotid artery stenosis demonstrated.   Cardiac catheterization 09/2000 Normal coronary arteries EF 50  Past Medical History:  Diagnosis Date  . Aortic insufficiency    mild by echo 2017  . Cancer ([facility])    Skin cancer- basal.  1 mylenoma  . Complication of anesthesia   . DCM (dilated cardiomyopathy) ([place])    EF 40-45% by echo 2017 - nuclear stress test with no ischemia  . GERD (gastroesophageal reflux disease)   . H/O benign essential tremor    on amiodarone resolved off amio  . H/O hyperkalemia    Resolved off ACE inhibitors  . High cholesterol   . History of blood transfusion   . Hyperparathyroidism ([place])   . Hypertension   . Mitral regurgitation    mild to moderate by echo 2017  . Persistent atrial fibrillation ([place])   . PONV (postoperative nausea and vomiting)   . PVC's (premature ventricular  contractions)   . Renal insufficiency    MILD  . Shortness of breath    with exertion  . Tachycardia-bradycardia syndrome [facility])    s/p PPM 02/2013    Past Surgical History:  Procedure Laterality Date  . AMPUTATION Right 11/27/2014   Procedure: RIGHT 2ND AND 3RD TOE AMPUTATION;  Surgeon: Wylene Simmer, MD;  Location: [facility];  Service: Orthopedics;  Laterality: Right;  . BUNIONECTOMY     Right  . CARDIAC CATHETERIZATION  09/25/2000   EF of  50% -- with normal left ventricular size and function  . CESAREAN SECTION    . CESAREAN SECTION    . EYE SURGERY Bilateral    Cataract  . FOOT SURGERY    . INSERT / REPLACE / REMOVE PACEMAKER  02/2013  . PACEMAKER INSERTION  02/2013  . PERMANENT PACEMAKER INSERTION N/A 03/02/2013   Procedure: PERMANENT PACEMAKER INSERTION;  Surgeon: Evans Lance, MD;  Location: [facility] CATH LAB;  Service: Cardiovascular;  Laterality: N/A;  . SHOULDER ARTHROSCOPY W/ ROTATOR CUFF REPAIR    . SHOULDER SURGERY Right    roto cuff  . TOE AMPUTATION Left    2nd and 3rd, bunion under toes.  . TONSILLECTOMY    . VARICOSE VEIN SURGERY      Current Medications: Current Meds  Medication Sig  . acetaminophen (TYLENOL) 500 MG tablet Take 1,000 mg by mouth every 6 (six) hours as needed. For pain  . atorvastatin (LIPITOR) 10 MG tablet Take 10 mg by mouth every evening.  . carvedilol (COREG) 3.125 MG tablet Take 1 tablet (3.125 mg total) by mouth 2 (two) times daily with a meal.  . dorzolamide (TRUSOPT) 2 % ophthalmic solution Place 1 drop into the right eye 2 (two) times daily.  Marland Kitchen LORazepam (ATIVAN) 0.5 MG tablet Take 0.5 tablets by mouth at bedtime as needed.  Marland Kitchen losartan (COZAAR) 25 MG tablet Take 0.5 tablets (12.5 mg total) by mouth daily.  . Multiple Vitamin (MULTIVITAMIN WITH MINERALS) TABS Take 1 tablet by mouth daily.  Marland Kitchen omeprazole (PRILOSEC) 20 MG capsule Take 20 mg by mouth daily.  . sotalol (BETAPACE) 80 MG tablet Take 1 tablet (80 mg total) by mouth 2 (two) times daily.  Marland Kitchen spironolactone (ALDACTONE) 25 MG tablet TAKE 1/2 TABLET ONCE A DAY  . warfarin (COUMADIN) 5 MG tablet TAKE 1/2 TO 1 TABLET DAILY AS DIRECTED BY ANTICOAGULATION CLINIC     Allergies:   Codeine and Tramadol   Social History   Tobacco Use  . Smoking status: Never Smoker  . Smokeless tobacco: Never Used  Substance Use Topics  . Alcohol use: No  . Drug use: No     Family Hx: The patient's family history includes Hypertension in her  father and mother.  ROS:   Please see the history of present illness.    Review of Systems  Cardiovascular: Positive for irregular heartbeat and leg swelling.  Respiratory: Positive for shortness of breath.   Hematologic/Lymphatic: Bruises/bleeds easily.  Musculoskeletal: Positive for back pain.  Neurological: Positive for dizziness.   All other systems reviewed and are negative.   EKGs/Labs/Other Test Reviewed:    EKG:  EKG is  ordered today.  The ekg ordered today demonstrates atrial fibrillation, heart rate 119, interventricular conduction delay  Recent Labs: 02/23/2017: ALT 13; Hemoglobin 12.9; Magnesium 1.9; Platelets 214; TSH 3.115 03/01/2017: BUN 20; Creatinine, Ser 1.02; Potassium 4.9; Sodium 139   Lab Results  Component Value Date   INR 3.0 12/28/2017   INR  3.5 12/13/2017   INR 2.7 11/03/2017     Recent Lipid Panel Lab Results  Component Value Date/Time   CHOL 156 05/23/2012 05:14 AM   TRIG 86 05/23/2012 05:14 AM   HDL 80 05/23/2012 05:14 AM   CHOLHDL 2.0 05/23/2012 05:14 AM   LDLCALC 59 05/23/2012 05:14 AM    Physical Exam:    VS:  BP 130/64   Pulse (!) 119   Ht 5' 5.5" (1.664 m)   Wt 188 lb 6.4 oz (85.5 kg)   SpO2 94%   BMI 30.87 kg/m    Orthostatic VS for the past 24 hrs (Last 3 readings):  BP- Lying Pulse- Lying BP- Sitting Pulse- Sitting BP- Standing at 0 minutes Pulse- Standing at 0 minutes BP- Standing at 3 minutes Pulse- Standing at 3 minutes  12/28/17 0945 130/64 88 122/78 88 118/80 78 132/74 71      Wt Readings from Last 3 Encounters:  12/28/17 188 lb 6.4 oz (85.5 kg)  12/13/17 190 lb (86.2 kg)  08/03/17 187 lb (84.8 kg)     Physical Exam  Constitutional: She is oriented to person, place, and time. She appears well-developed and well-nourished. No distress.  HENT:  Head: Normocephalic and atraumatic.  Neck: No JVD present.  Cardiovascular: An irregularly irregular rhythm present. Tachycardia present.  No murmur heard. Pulmonary/Chest:  Effort normal. She has no rales.  Abdominal: Soft.  Musculoskeletal: She exhibits no edema.  Neurological: She is alert and oriented to person, place, and time.  Skin: Skin is warm and dry.    ASSESSMENT & PLAN:    #1.  Atrial fibrillation with RVR ([place]) She presents with recurrent atrial fibrillation with rapid ventricular rate.  She is significantly symptomatic.  Her INR has been therapeutic for the last few months.  INR today is 3.0.  I reviewed her case with Dr. [name].  We will send her to the hospital today for cardioversion to restore normal sinus rhythm.  She will follow-up with Dr. [name] in 2 weeks to discuss further options for rhythm control (i.e admission for dofetilide versus AV nodal ablation).  -Continue current dose of sotalol, carvedilol  -Arrange cardioversion today  #2.  DCM (dilated cardiomyopathy) (HCC) Nonischemic cardiomyopathy.  She had worsening LV function in 2018 in the setting of rapid ventricular rate felt to be related to tachycardia.  Continue beta-blocker, ARB, spironolactone.  #3.  Chronic combined systolic and diastolic heart failure (HCC) NYHA 2 b-3.  Volume appears stable on exam.  Continue current dose of beta-blocker, ARB, Spironolactone.  #4.  Non-rheumatic mitral regurgitation Mild to moderate mitral regurgitation by most recent echocardiogram.  #5.  Pacemaker Continue follow-up with EP as planned.   Dispo:  Return in about 2 weeks (around 01/11/2018) for Post Procedure Follow Up with Dr. [name].   Medication Adjustments/Labs and Tests Ordered: Current medicines are reviewed at length with the patient today.  Concerns regarding medicines are outlined above.  Tests Ordered: Orders Placed This Encounter  Procedures  . EKG 12-Lead   Medication Changes: No orders of the defined types were placed in this encounter.   Signed, Richardson Dopp, PA-C  12/28/2017 10:21 AM    [address] [place], [place], [place]   [address] Phone: [phone]; Fax: [phone]

## 2017-12-28 NOTE — Anesthesia Postprocedure Evaluation (Signed)
Anesthesia Post Note  Patient: Molly [name]  Procedure(s) Performed: CARDIOVERSION (N/A )     Patient location during evaluation: PACU Anesthesia Type: General Level of consciousness: awake and alert Pain management: pain level controlled Vital Signs Assessment: post-procedure vital signs reviewed and stable Respiratory status: spontaneous breathing, nonlabored ventilation and respiratory function stable Cardiovascular status: blood pressure returned to baseline and stable Postop Assessment: no apparent nausea or vomiting Anesthetic complications: no    Last Vitals:  Vitals:   12/28/17 1325 12/28/17 1335  BP: 98/60 111/67  Pulse: 72 70  Resp: 20 19  Temp:    SpO2: 95% 95%    Last Pain:  Vitals:   12/28/17 1310  TempSrc: Oral                 Amarachi Kotz,W. EDMOND

## 2017-12-28 NOTE — Discharge Instructions (Signed)
Electrical Cardioversion, Care After °This sheet gives you information about how to care for yourself after your procedure. Your health care provider may also give you more specific instructions. If you have problems or questions, contact your health care provider. °What can I expect after the procedure? °After the procedure, it is common to have: °· Some redness on the skin where the shocks were given. ° °Follow these instructions at home: °· Do not drive for 24 hours if you were given a medicine to help you relax (sedative). °· Take over-the-counter and prescription medicines only as told by your health care provider. °· Ask your health care provider how to check your pulse. Check it often. °· Rest for 48 hours after the procedure or as told by your health care provider. °· Avoid or limit your caffeine use as told by your health care provider. °Contact a health care provider if: °· You feel like your heart is beating too quickly or your pulse is not regular. °· You have a serious muscle cramp that does not go away. °Get help right away if: °· You have discomfort in your chest. °· You are dizzy or you feel faint. °· You have trouble breathing or you are short of breath. °· Your speech is slurred. °· You have trouble moving an arm or leg on one side of your body. °· Your fingers or toes turn cold or blue. °This information is not intended to replace advice given to you by your health care provider. Make sure you discuss any questions you have with your health care provider. °Document Released: 08/02/2013 Document Revised: 05/15/2016 Document Reviewed: 04/17/2016 °Elsevier Interactive Patient Education © 2018 [employer] ° °

## 2017-12-28 NOTE — Anesthesia Preprocedure Evaluation (Addendum)
Anesthesia Evaluation  Patient identified by MRN, date of birth, ID band Patient awake    Reviewed: Allergy & Precautions, H&P , NPO status , Patient's Chart, lab work & pertinent test results, reviewed documented beta blocker date and time   History of Anesthesia Complications (+) PONV  Airway Mallampati: III  TM Distance: >3 FB Neck ROM: Full    Dental no notable dental hx. (+) Partial Upper, Dental Advisory Given   Pulmonary neg pulmonary ROS,    Pulmonary exam normal breath sounds clear to auscultation       Cardiovascular hypertension, Pt. on medications and Pt. on home beta blockers + dysrhythmias Atrial Fibrillation + pacemaker  Rhythm:Regular Rate:Normal     Neuro/Psych negative neurological ROS  negative psych ROS   GI/Hepatic Neg liver ROS, GERD  Controlled and Medicated,  Endo/Other  negative endocrine ROS  Renal/GU negative Renal ROS  negative genitourinary   Musculoskeletal   Abdominal   Peds  Hematology negative hematology ROS (+)   Anesthesia Other Findings   Reproductive/Obstetrics negative OB ROS                            Anesthesia Physical Anesthesia Plan  ASA: III  Anesthesia Plan: General   Post-op Pain Management:    Induction: Intravenous  PONV Risk Score and Plan: 4 or greater and Treatment may vary due to age or medical condition  Airway Management Planned: Mask  Additional Equipment:   Intra-op Plan:   Post-operative Plan:   Informed Consent: I have reviewed the patients History and Physical, chart, labs and discussed the procedure including the risks, benefits and alternatives for the proposed anesthesia with the patient or authorized representative who has indicated his/her understanding and acceptance.   Dental advisory given  Plan Discussed with: CRNA  Anesthesia Plan Comments:         Anesthesia Quick Evaluation

## 2017-12-28 NOTE — Transfer of Care (Signed)
Immediate Anesthesia Transfer of Care Note  Patient: Molly [name]  Procedure(s) Performed: CARDIOVERSION (N/A )  Patient Location: Endoscopy Unit  Anesthesia Type:General  Level of Consciousness: awake, alert , oriented and patient cooperative  Airway & Oxygen Therapy: Patient Spontanous Breathing and Patient connected to nasal cannula oxygen  Post-op Assessment: Report given to RN and Post -op Vital signs reviewed and stable  Post vital signs: Reviewed and stable  Last Vitals:  Vitals:   12/28/17 1123  BP: 124/82  Pulse: (!) 55  Resp: (!) 21  SpO2: 98%    Last Pain: There were no vitals filed for this visit.       Complications: No apparent anesthesia complications

## 2017-12-29 ENCOUNTER — Encounter (HOSPITAL_COMMUNITY): Payer: Self-pay | Admitting: Cardiology

## 2018-01-07 LAB — CUP PACEART REMOTE DEVICE CHECK
Battery Remaining Percentage: 100 %
Brady Statistic RA Percent Paced: 75 %
Brady Statistic RV Percent Paced: 12 %
Implantable Lead Implant Date: 20140508
Implantable Lead Location: 753859
Implantable Lead Location: 753860
Implantable Lead Serial Number: 29379474
Lead Channel Pacing Threshold Pulse Width: 0.4 ms
Lead Channel Setting Pacing Amplitude: 2 V
Lead Channel Setting Pacing Amplitude: 2.4 V
Lead Channel Setting Pacing Pulse Width: 0.4 ms
MDC IDC LEAD IMPLANT DT: 20140508
MDC IDC LEAD SERIAL: 29333020
MDC IDC MSMT BATTERY REMAINING LONGEVITY: 102 mo
MDC IDC MSMT LEADCHNL RA IMPEDANCE VALUE: 641 Ohm
MDC IDC MSMT LEADCHNL RA PACING THRESHOLD AMPLITUDE: 1 V
MDC IDC MSMT LEADCHNL RV IMPEDANCE VALUE: 669 Ohm
MDC IDC PG IMPLANT DT: 20140508
MDC IDC PG SERIAL: 112392
MDC IDC SESS DTM: 20190225074100
MDC IDC SET LEADCHNL RV SENSING SENSITIVITY: 2.5 mV

## 2018-01-11 ENCOUNTER — Encounter: Payer: Self-pay | Admitting: Internal Medicine

## 2018-01-11 ENCOUNTER — Ambulatory Visit (INDEPENDENT_AMBULATORY_CARE_PROVIDER_SITE_OTHER): Payer: Medicare Other | Admitting: Internal Medicine

## 2018-01-11 VITALS — BP 120/80 | HR 70 | Ht 65.5 in | Wt 188.0 lb

## 2018-01-11 DIAGNOSIS — I495 Sick sinus syndrome: Secondary | ICD-10-CM | POA: Diagnosis not present

## 2018-01-11 DIAGNOSIS — I420 Dilated cardiomyopathy: Secondary | ICD-10-CM

## 2018-01-11 DIAGNOSIS — I5042 Chronic combined systolic (congestive) and diastolic (congestive) heart failure: Secondary | ICD-10-CM

## 2018-01-11 DIAGNOSIS — I481 Persistent atrial fibrillation: Secondary | ICD-10-CM | POA: Diagnosis not present

## 2018-01-11 DIAGNOSIS — Z950 Presence of cardiac pacemaker: Secondary | ICD-10-CM

## 2018-01-11 DIAGNOSIS — I4819 Other persistent atrial fibrillation: Secondary | ICD-10-CM

## 2018-01-11 NOTE — Progress Notes (Signed)
HPI Molly [name] returns today for ongoing evaluation and management of her PPM in the setting of sinus node dysfunction. She has had atrial fib and undergone DCCV. She denies chest pain or sob. No syncope. She has a h/o chronic systolic heart failure, EF 40% with focal basal hypertrophy. She feels much better in NSR.  Allergies  Allergen Reactions  . Codeine   . Tramadol Nausea Only     Current Outpatient Medications  Medication Sig Dispense Refill  . acetaminophen (TYLENOL) 500 MG tablet Take 1,000 mg by mouth every 6 (six) hours as needed. For pain    . atorvastatin (LIPITOR) 10 MG tablet Take 10 mg by mouth every evening.    . carvedilol (COREG) 3.125 MG tablet Take 1 tablet (3.125 mg total) by mouth 2 (two) times daily with a meal. 180 tablet 3  . dorzolamide (TRUSOPT) 2 % ophthalmic solution Place 1 drop into the right eye 2 (two) times daily.    Marland Kitchen LORazepam (ATIVAN) 0.5 MG tablet Take 0.5 tablets by mouth at bedtime as needed.  0  . losartan (COZAAR) 25 MG tablet Take 0.5 tablets (12.5 mg total) by mouth daily. 90 tablet 3  . Multiple Vitamin (MULTIVITAMIN WITH MINERALS) TABS Take 1 tablet by mouth daily.    Marland Kitchen omeprazole (PRILOSEC) 20 MG capsule Take 20 mg by mouth daily.    . sotalol (BETAPACE) 80 MG tablet Take 1 tablet (80 mg total) by mouth 2 (two) times daily. 60 tablet 10  . spironolactone (ALDACTONE) 25 MG tablet TAKE 1/2 TABLET ONCE A DAY 30 tablet 10  . warfarin (COUMADIN) 5 MG tablet TAKE 1/2 TO 1 TABLET DAILY AS DIRECTED BY ANTICOAGULATION CLINIC 30 tablet 3   No current facility-administered medications for this visit.      Past Medical History:  Diagnosis Date  . Aortic insufficiency    mild by echo 2017  . Cancer ([place])    Skin cancer- basal.  1 mylenoma  . Complication of anesthesia   . DCM (dilated cardiomyopathy) ([place])    EF 40-45% by echo 2017 - nuclear stress test with no ischemia  . GERD (gastroesophageal reflux disease)   . H/O benign  essential tremor    on amiodarone resolved off amio  . H/O hyperkalemia    Resolved off ACE inhibitors  . High cholesterol   . History of blood transfusion   . Hyperparathyroidism ([place])   . Hypertension   . Mitral regurgitation    mild to moderate by echo 2017  . Persistent atrial fibrillation ([place])   . PONV (postoperative nausea and vomiting)   . PVC's (premature ventricular contractions)   . Renal insufficiency    MILD  . Shortness of breath    with exertion  . Tachycardia-bradycardia syndrome ([place])    s/p PPM 02/2013    ROS:   All systems reviewed and negative except as noted in the HPI.   Past Surgical History:  Procedure Laterality Date  . AMPUTATION Right 11/27/2014   Procedure: RIGHT 2ND AND 3RD TOE AMPUTATION;  Surgeon: Wylene Simmer, MD;  Location: [place];  Service: Orthopedics;  Laterality: Right;  . BUNIONECTOMY     Right  . CARDIAC CATHETERIZATION  09/25/2000   EF of 50% -- with normal left ventricular size and function  . CARDIOVERSION N/A 12/28/2017   Procedure: CARDIOVERSION;  Surgeon: Dorothy Spark, MD;  Location[facility] ENDOSCOPY;  Service: Cardiovascular;  Laterality: N/A;  . CESAREAN SECTION    .  CESAREAN SECTION    . EYE SURGERY Bilateral    Cataract  . FOOT SURGERY    . INSERT / REPLACE / REMOVE PACEMAKER  02/2013  . PACEMAKER INSERTION  02/2013  . PERMANENT PACEMAKER INSERTION N/A 03/02/2013   Procedure: PERMANENT PACEMAKER INSERTION;  Surgeon: Evans Lance, MD;  Location: [facility] CATH LAB;  Service: Cardiovascular;  Laterality: N/A;  . SHOULDER ARTHROSCOPY W/ ROTATOR CUFF REPAIR    . SHOULDER SURGERY Right    roto cuff  . TOE AMPUTATION Left    2nd and 3rd, bunion under toes.  . TONSILLECTOMY    . VARICOSE VEIN SURGERY       Family History  Problem Relation Age of Onset  . Hypertension Mother   . Hypertension Father      Social History   Socioeconomic History  . Marital status: Married    Spouse name: Not on file  . Number of children: Not  on file  . Years of education: Not on file  . Highest education level: Not on file  Social Needs  . Financial resource strain: Not on file  . Food insecurity - worry: Not on file  . Food insecurity - inability: Not on file  . Transportation needs - medical: Not on file  . Transportation needs - non-medical: Not on file  Occupational History  . Not on file  Tobacco Use  . Smoking status: Never Smoker  . Smokeless tobacco: Never Used  Substance and Sexual Activity  . Alcohol use: No  . Drug use: No  . Sexual activity: Not on file  Other Topics Concern  . Not on file  Social History Narrative  . Not on file     BP 120/80   Pulse 70   Ht 5' 5.5" (1.664 m)   Wt 188 lb (85.3 kg)   BMI 30.81 kg/m   Physical Exam:  Well appearing 84 yo woman, NAD HEENT: Unremarkable Neck:  6 cm JVD, no thyromegally Lymphatics:  No adenopathy Back:  No CVA tenderness Lungs:  Clear with no wheezes HEART:  Regular rate rhythm, no murmurs, no rubs, no clicks Abd:  soft, positive bowel sounds, no organomegally, no rebound, no guarding Ext:  2 plus pulses, no edema, no cyanosis, no clubbing Skin:  No rashes no nodules Neuro:  CN II through XII intact, motor grossly intact  EKG - nsr with atrial pacing  DEVICE  Normal device function.  See PaceArt for details.   Assess/Plan: 1. Atrial fib - she is maintaining NSR very nicely. She will continue her sotalol. If she does not maintain NSR, dofetilide would be another option. 2. Cardiomyopathy - she has class 2 CHF symptoms, down from 3 when she was in atrial fib. She will continue her current meds. 3. MR - she has moderate MR by echo and she is minimally symptomatic.  4. PPM - her [employer] DDD PM is working normally. Will recheck in several months.  Mikle Bosworth.D.

## 2018-01-11 NOTE — Patient Instructions (Addendum)
Medication Instructions:  Your physician recommends that you continue on your current medications as directed. Please refer to the Current Medication list given to you today.  Labwork: None ordered  Testing/Procedures: None ordered  Follow-Up: Remote monitoring is used to monitor your Pacemaker of ICD from home. This monitoring reduces the number of office visits required to check your device to one time per year. It allows [place] to keep an eye on the functioning of your device to ensure it is working properly. You are scheduled for a device check from home on 03/20/18 You may send your transmission at any time that day. If you have a wireless device, the transmission will be sent automatically. After your physician reviews your transmission, you will receive a postcard with your next transmission date.     Your physician wants you to follow-up in: 1 year with Dr. [name].  You will receive a reminder letter in the mail two months in advance. If you don't receive a letter, please call our office to schedule the follow-up appointment.   Any Other Special Instructions Will Be Listed Below (If Applicable).     If you need a refill on your cardiac medications before your next appointment, please call your pharmacy.

## 2018-01-18 ENCOUNTER — Ambulatory Visit (INDEPENDENT_AMBULATORY_CARE_PROVIDER_SITE_OTHER): Payer: Medicare Other | Admitting: *Deleted

## 2018-01-18 DIAGNOSIS — I481 Persistent atrial fibrillation: Secondary | ICD-10-CM

## 2018-01-18 DIAGNOSIS — I4819 Other persistent atrial fibrillation: Secondary | ICD-10-CM

## 2018-01-18 DIAGNOSIS — I4891 Unspecified atrial fibrillation: Secondary | ICD-10-CM

## 2018-01-18 DIAGNOSIS — Z5181 Encounter for therapeutic drug level monitoring: Secondary | ICD-10-CM

## 2018-01-18 LAB — POCT INR: INR: 3.6

## 2018-01-18 NOTE — Patient Instructions (Signed)
Description   Do not take coumadin today March 26th then on March 27th take 1/2 tablet (2.5mg ) then continue same dose 1/2 tablet everyday except 1 tablet on Mondays, Wednesdays and Fridays.   Recheck INR in 2 weeks.

## 2018-01-24 DIAGNOSIS — H401412 Capsular glaucoma with pseudoexfoliation of lens, right eye, moderate stage: Secondary | ICD-10-CM | POA: Diagnosis not present

## 2018-01-31 ENCOUNTER — Telehealth: Payer: Self-pay | Admitting: Internal Medicine

## 2018-01-31 NOTE — Telephone Encounter (Signed)
Patient was in A-fib as of automatic transmission on 01/29/18 at 0241.  Spoke with patient to request a transmission today.  She is agreeable and will send now.  Advised we will review it when received and call her back with recommendations.  She is appreciative.

## 2018-01-31 NOTE — Telephone Encounter (Signed)
Manual transmission received.  Patient is currently in A-tach, V rate 90s-130s per presenting EGM.  ATR episodes noted to begin on 01/28/18 at 1408, correlating with patient's reports of episode onset.  Will route to Myrtie Hawk, RN, for review.

## 2018-01-31 NOTE — Telephone Encounter (Signed)
Molly [name] is calling because she has been in AFIB since Friday and is wanting to speak with you about it . Please call   Thanks

## 2018-01-31 NOTE — Telephone Encounter (Signed)
Returned call to Pt.

## 2018-01-31 NOTE — Telephone Encounter (Signed)
Returned call to Pt.  Notified Pt she is in [place].  Per Dr. Tanna Furry last note if Pt did not maintain NSR her next option was dofetilide.  Spoke with Stacy at [facility] clinic.  Per Stacy Pt had declined dofetilide previously d/t cost.  Tentatively scheduled Pt for afib clinic on February 07, 2018 @ 11:00 am for Tikosyn admit. Spoke with Pt, Pt willing to try Tikosyn now.  Pt states she continues to feel horrible.  Pt states systolic BP was 79 earlier.  Advised Pt if systolic less than 90 hold her carvedilol.  Pt indicates understanding.  Will discuss with Dr. [name].

## 2018-02-01 ENCOUNTER — Telehealth: Payer: Self-pay | Admitting: Pharmacist

## 2018-02-01 NOTE — Telephone Encounter (Signed)
-----   Message from Juluis Mire, RN sent at 01/31/2018  2:02 PM EDT ----- Regarding: tikosyn Pt for tikosyn - to stop sotalol on Thursday (admit Monday)  also dr [name] stated did not want weekly INRs prior to admit since no lows last 3 months documented.   Will you set her up for a INR check on 4/15 prior to our appt at 11am.  Thanks stacy

## 2018-02-01 NOTE — Telephone Encounter (Signed)
Returned call to Pt.  Pt states BP continues to be low, also having some low heart rates.  Pt has held carvedilol last night and this AM. Pt feels continues to feel awful. Advised Pt to decrease sotalol to once a day, advised to hold tonight.  She will take one dose tomorrow and one dose on Thursday and then stop.  Pt will continue to hold carvedilol for sbp less than 90 and hr less than 60.   Pt states she does not feel well and will not go to coumadin appt today.  Advised Pt she needs to have this lab before her admit on Monday. Pt indicates understanding. Pt will go to ER for chest pain or syncope.

## 2018-02-01 NOTE — Telephone Encounter (Signed)
Follow up    Patient is call back in reference to her being in Afib. She states that she is waiting on hearing back from the nurse after she spoke with Dr. [name]. Please call to discuss Dr. [name] recommendations.

## 2018-02-01 NOTE — Telephone Encounter (Signed)
Medication list reviewed in anticipation of upcoming Tikosyn initiation. Patient is not taking any contraindicated or QTc prolonging medications.  Pt is to stop sotalol on Thursday in anticipation of admission on Monday 4/15.   Patient is anticoagulated on warfarin on which has been therapeutic for few checks - per Dr. [name] ok without weekly checks. Please ensure that patient has not missed any anticoagulation doses in the 3 weeks prior to Tikosyn initiation. Will coordinate INR check on morning of admission.   Patient will need to be counseled to avoid use of Benadryl while on Tikosyn and in the 2-3 days prior to Tikosyn initiation.

## 2018-02-03 ENCOUNTER — Ambulatory Visit (INDEPENDENT_AMBULATORY_CARE_PROVIDER_SITE_OTHER): Payer: Medicare Other | Admitting: *Deleted

## 2018-02-03 ENCOUNTER — Telehealth: Payer: Self-pay

## 2018-02-03 DIAGNOSIS — I481 Persistent atrial fibrillation: Secondary | ICD-10-CM

## 2018-02-03 DIAGNOSIS — I4819 Other persistent atrial fibrillation: Secondary | ICD-10-CM

## 2018-02-03 DIAGNOSIS — Z5181 Encounter for therapeutic drug level monitoring: Secondary | ICD-10-CM

## 2018-02-03 DIAGNOSIS — I4891 Unspecified atrial fibrillation: Secondary | ICD-10-CM

## 2018-02-03 LAB — POCT INR: INR: 3.1

## 2018-02-03 NOTE — Telephone Encounter (Signed)
Checked in with Pt.  Pt continues to feel "awful".  Blood pressure continues to be low.  Spoke with DOD.  Advised Pt can hold her losartan as well as her carvedilol.  Pt took her last dose of sotalol this AM.   Advised Pt to wear her compression hose to help with her blood pressure. Pt indicates understanding.

## 2018-02-03 NOTE — Patient Instructions (Signed)
Description   Continue same dose 1/2 tablet everyday except 1 tablet on Mondays, Wednesdays and Fridays.   Recheck INR on Monday before Tikosyn admission.

## 2018-02-07 ENCOUNTER — Encounter (HOSPITAL_COMMUNITY): Payer: Self-pay

## 2018-02-07 ENCOUNTER — Encounter (HOSPITAL_COMMUNITY): Payer: Self-pay | Admitting: Nurse Practitioner

## 2018-02-07 ENCOUNTER — Other Ambulatory Visit: Payer: Self-pay

## 2018-02-07 ENCOUNTER — Inpatient Hospital Stay (HOSPITAL_COMMUNITY)
Admission: AD | Admit: 2018-02-07 | Discharge: 2018-02-10 | DRG: 309 | Disposition: A | Discharge status: Home / Self Care | Payer: Medicare Other | Source: Ambulatory Visit | Attending: Internal Medicine | Admitting: Internal Medicine

## 2018-02-07 ENCOUNTER — Ambulatory Visit (INDEPENDENT_AMBULATORY_CARE_PROVIDER_SITE_OTHER): Payer: Medicare Other | Admitting: *Deleted

## 2018-02-07 ENCOUNTER — Ambulatory Visit (HOSPITAL_COMMUNITY)
Admission: RE | Admit: 2018-02-07 | Discharge: 2018-02-07 | Disposition: A | Discharge status: Home / Self Care | Payer: Medicare Other | Source: Ambulatory Visit | Attending: Nurse Practitioner | Admitting: Nurse Practitioner

## 2018-02-07 VITALS — BP 148/84 | HR 150 | Ht 65.5 in | Wt 187.4 lb

## 2018-02-07 DIAGNOSIS — I110 Hypertensive heart disease with heart failure: Secondary | ICD-10-CM | POA: Diagnosis present

## 2018-02-07 DIAGNOSIS — I4891 Unspecified atrial fibrillation: Secondary | ICD-10-CM

## 2018-02-07 DIAGNOSIS — Z89421 Acquired absence of other right toe(s): Secondary | ICD-10-CM

## 2018-02-07 DIAGNOSIS — I481 Persistent atrial fibrillation: Principal | ICD-10-CM | POA: Diagnosis present

## 2018-02-07 DIAGNOSIS — Z5181 Encounter for therapeutic drug level monitoring: Secondary | ICD-10-CM

## 2018-02-07 DIAGNOSIS — Z7901 Long term (current) use of anticoagulants: Secondary | ICD-10-CM | POA: Diagnosis not present

## 2018-02-07 DIAGNOSIS — Z89422 Acquired absence of other left toe(s): Secondary | ICD-10-CM | POA: Diagnosis not present

## 2018-02-07 DIAGNOSIS — I495 Sick sinus syndrome: Secondary | ICD-10-CM | POA: Diagnosis present

## 2018-02-07 DIAGNOSIS — I480 Paroxysmal atrial fibrillation: Secondary | ICD-10-CM | POA: Diagnosis not present

## 2018-02-07 DIAGNOSIS — Z950 Presence of cardiac pacemaker: Secondary | ICD-10-CM | POA: Diagnosis not present

## 2018-02-07 DIAGNOSIS — Z79899 Other long term (current) drug therapy: Secondary | ICD-10-CM

## 2018-02-07 DIAGNOSIS — I5022 Chronic systolic (congestive) heart failure: Secondary | ICD-10-CM | POA: Diagnosis present

## 2018-02-07 DIAGNOSIS — I4819 Other persistent atrial fibrillation: Secondary | ICD-10-CM

## 2018-02-07 LAB — BASIC METABOLIC PANEL
ANION GAP: 10 (ref 5–15)
BUN: 27 mg/dL — ABNORMAL HIGH (ref 6–20)
CHLORIDE: 111 mmol/L (ref 101–111)
CO2: 20 mmol/L — AB (ref 22–32)
Calcium: 10.3 mg/dL (ref 8.9–10.3)
Creatinine, Ser: 1.33 mg/dL — ABNORMAL HIGH (ref 0.44–1.00)
GFR calc Af Amer: 42 mL/min — ABNORMAL LOW (ref >60)
GFR calc non Af Amer: 36 mL/min — ABNORMAL LOW (ref >60)
Glucose, Bld: 113 mg/dL — ABNORMAL HIGH (ref 65–99)
POTASSIUM: 4.7 mmol/L (ref 3.5–5.1)
Sodium: 141 mmol/L (ref 135–145)

## 2018-02-07 LAB — POCT INR: INR: 2.4

## 2018-02-07 LAB — MAGNESIUM: Magnesium: 1.6 mg/dL — ABNORMAL LOW (ref 1.7–2.4)

## 2018-02-07 MED ORDER — WARFARIN SODIUM 5 MG PO TABS
5.0000 mg | ORAL_TABLET | ORAL | Status: AC
Start: 2018-02-07 — End: 2018-02-07
  Administered 2018-02-07: 5 mg via ORAL
  Filled 2018-02-07: qty 1

## 2018-02-07 MED ORDER — WARFARIN - PHARMACIST DOSING INPATIENT
Freq: Every day | Status: DC
Start: 2018-02-07 — End: 2018-02-10

## 2018-02-07 MED ORDER — PANTOPRAZOLE SODIUM 40 MG PO TBEC
40.0000 mg | DELAYED_RELEASE_TABLET | Freq: Every day | ORAL | Status: DC
  Administered 2018-02-07 – 2018-02-10 (×4): 40 mg via ORAL
  Filled 2018-02-07 (×4): qty 1

## 2018-02-07 MED ORDER — MAGNESIUM SULFATE 2 GM/50ML IV SOLN
2.0000 g | Freq: Once | INTRAVENOUS | Status: AC
  Administered 2018-02-07: 2 g via INTRAVENOUS
  Filled 2018-02-07: qty 50

## 2018-02-07 MED ORDER — LORAZEPAM 0.5 MG PO TABS
0.2500 mg | ORAL_TABLET | Freq: Every evening | ORAL | Status: DC | PRN
  Administered 2018-02-07 – 2018-02-09 (×3): 0.25 mg via ORAL
  Filled 2018-02-07 (×3): qty 1

## 2018-02-07 MED ORDER — SODIUM CHLORIDE 0.9 % IV SOLN: 250.0000 mL | INTRAVENOUS | Status: DC | PRN

## 2018-02-07 MED ORDER — ADULT MULTIVITAMIN W/MINERALS CH
1.0000 | ORAL_TABLET | Freq: Every day | ORAL | Status: DC
  Administered 2018-02-08 – 2018-02-10 (×3): 1 via ORAL
  Filled 2018-02-07 (×3): qty 1

## 2018-02-07 MED ORDER — SODIUM CHLORIDE 0.9% FLUSH
3.0000 mL | Freq: Two times a day (BID) | INTRAVENOUS | Status: DC
  Administered 2018-02-08 – 2018-02-09 (×3): 3 mL via INTRAVENOUS

## 2018-02-07 MED ORDER — DOFETILIDE 250 MCG PO CAPS: 250.0000 ug | ORAL_CAPSULE | Freq: Two times a day (BID) | ORAL | Status: DC

## 2018-02-07 MED ORDER — DORZOLAMIDE HCL 2 % OP SOLN
1.0000 [drp] | Freq: Two times a day (BID) | OPHTHALMIC | Status: DC
  Administered 2018-02-07 – 2018-02-10 (×6): 1 [drp] via OPHTHALMIC
  Filled 2018-02-07: qty 10

## 2018-02-07 MED ORDER — SODIUM CHLORIDE 0.9% FLUSH: 3.0000 mL | INTRAVENOUS | Status: DC | PRN

## 2018-02-07 MED ORDER — CARVEDILOL 3.125 MG PO TABS
3.1250 mg | ORAL_TABLET | Freq: Two times a day (BID) | ORAL | Status: DC
  Administered 2018-02-07 – 2018-02-10 (×6): 3.125 mg via ORAL
  Filled 2018-02-07 (×6): qty 1

## 2018-02-07 MED ORDER — DOFETILIDE 250 MCG PO CAPS
250.0000 ug | ORAL_CAPSULE | Freq: Two times a day (BID) | ORAL | Status: DC
  Administered 2018-02-07 – 2018-02-10 (×6): 250 ug via ORAL
  Filled 2018-02-07 (×6): qty 1

## 2018-02-07 MED ORDER — WARFARIN SODIUM 2.5 MG PO TABS
2.5000 mg | ORAL_TABLET | ORAL | Status: AC
  Administered 2018-02-08: 2.5 mg via ORAL
  Filled 2018-02-07: qty 1

## 2018-02-07 MED ORDER — SPIRONOLACTONE 12.5 MG HALF TABLET
12.5000 mg | ORAL_TABLET | Freq: Every day | ORAL | Status: DC
  Administered 2018-02-08 – 2018-02-10 (×3): 12.5 mg via ORAL
  Filled 2018-02-07 (×4): qty 1

## 2018-02-07 MED ORDER — ATORVASTATIN CALCIUM 10 MG PO TABS
10.0000 mg | ORAL_TABLET | Freq: Every evening | ORAL | Status: DC
  Administered 2018-02-07 – 2018-02-09 (×3): 10 mg via ORAL
  Filled 2018-02-07 (×3): qty 1

## 2018-02-07 MED ORDER — ACETAMINOPHEN 500 MG PO TABS
1000.0000 mg | ORAL_TABLET | Freq: Four times a day (QID) | ORAL | Status: DC | PRN
  Administered 2018-02-07: 1000 mg via ORAL
  Administered 2018-02-08: 500 mg via ORAL
  Administered 2018-02-08 – 2018-02-10 (×3): 1000 mg via ORAL
  Filled 2018-02-07 (×5): qty 2

## 2018-02-07 NOTE — Progress Notes (Signed)
EKGs (today's and 01/11/18) and labs reviewed with Dr. [name]. Last sinus/A paced rhythm EKG QTc is acceptable to start Tikosyn tonight, Mag was 1.6, replaced with 2gm IV,  no need to recheck prior to starting Tikosyn tonight.  OK for Tikosyn start this evening.  Madilyn Hook, PA-C

## 2018-02-07 NOTE — H&P (Addendum)
Cardiology Admission History and Physical:   Patient ID: Molly [name]; MRN: [id number]; DOB: December 19, 1932   Admission date: 02/07/2018  Primary Care Provider: Antony Contras, MD Primary Cardiologist: Fransico Him, MD  Primary Electrophysiologist:  Dr. [name]  Chief Complaint:  Tikosyn initiation, rapid AFib  Patient Profile:   Molly [name] is a 84 y.o. female with a history of Paroxysmal AFib (has failed sotalol and amiodarone (2014), HTN, HLD, chronic CHF (systolic) suspected to be tachy-mediated, SSSx w/PPM,   History of Present Illness:   Molly [name] saw Dr. [name] in march, was at that visit in [place], though was discussed if recurrent AF on Sotalol, would consider Tikosyn.  Historically [name] has been cost prohibitive though the patient feels terrible in AFib and felt she wanted to go forward, her last dose of Sotalol was Thursday, the patient confirms this.  Warfarin has been therapeutic consistently  And again today, in preparation for admission, Dr. [name] did not feel weekly INRs needed given she has been consistently therapeutic for many months.  She feels terrible in AFib, quite weak, recently has noticed her BP gets low with AFib as well.   This past week feeling quite weak with low BP and her AFib, "just no energy".  No CP, no syncope.  Device information: BSCi dual chamber PPM implanted 03/02/13   Past Medical History:  Diagnosis Date  . Aortic insufficiency    mild by echo 2017  . Cancer ([place])    Skin cancer- basal.  1 mylenoma  . Complication of anesthesia   . DCM (dilated cardiomyopathy) ([place])    EF 40-45% by echo 2017 - nuclear stress test with no ischemia  . GERD (gastroesophageal reflux disease)   . H/O benign essential tremor    on amiodarone resolved off amio  . H/O hyperkalemia    Resolved off ACE inhibitors  . High cholesterol   . History of blood transfusion   . Hyperparathyroidism ([place])   . Hypertension   . Mitral regurgitation    mild to  moderate by echo 2017  . Persistent atrial fibrillation ([place])   . PONV (postoperative nausea and vomiting)   . PVC's (premature ventricular contractions)   . Renal insufficiency    MILD  . Shortness of breath    with exertion  . Tachycardia-bradycardia syndrome [facility])    s/p PPM 02/2013    Past Surgical History:  Procedure Laterality Date  . AMPUTATION Right 11/27/2014   Procedure: RIGHT 2ND AND 3RD TOE AMPUTATION;  Surgeon: Wylene Simmer, MD;  Location: [place];  Service: Orthopedics;  Laterality: Right;  . BUNIONECTOMY     Right  . CARDIAC CATHETERIZATION  09/25/2000   EF of 50% -- with normal left ventricular size and function  . CARDIOVERSION N/A 12/28/2017   Procedure: CARDIOVERSION;  Surgeon: Dorothy Spark, MD;  Location: [facility] ENDOSCOPY;  Service: Cardiovascular;  Laterality: N/A;  . CESAREAN SECTION    . CESAREAN SECTION    . EYE SURGERY Bilateral    Cataract  . FOOT SURGERY    . INSERT / REPLACE / REMOVE PACEMAKER  02/2013  . PACEMAKER INSERTION  02/2013  . PERMANENT PACEMAKER INSERTION N/A 03/02/2013   Procedure: PERMANENT PACEMAKER INSERTION;  Surgeon: Evans Lance, MD;  Location: [facility] CATH LAB;  Service: Cardiovascular;  Laterality: N/A;  . SHOULDER ARTHROSCOPY W/ ROTATOR CUFF REPAIR    . SHOULDER SURGERY Right    roto cuff  . TOE AMPUTATION Left    2nd and 3rd,  bunion under toes.  . TONSILLECTOMY    . VARICOSE VEIN SURGERY       Medications Prior to Admission: Prior to Admission medications   Medication Sig Start Date End Date Taking? Authorizing Provider  acetaminophen (TYLENOL) 500 MG tablet Take 1,000 mg by mouth every 6 (six) hours as needed. For pain    [provider]  atorvastatin (LIPITOR) 10 MG tablet Take 10 mg by mouth every evening.    [provider]  carvedilol (COREG) 3.125 MG tablet Take 1 tablet (3.125 mg total) by mouth 2 (two) times daily with a meal. Patient not taking: Reported on 02/07/2018 05/17/17   Imogene Burn, PA-C    dorzolamide (TRUSOPT) 2 % ophthalmic solution Place 1 drop into the right eye 2 (two) times daily. 06/22/12   [provider]  LORazepam (ATIVAN) 0.5 MG tablet Take 0.5 tablets by mouth at bedtime as needed. 05/06/17   [provider]  losartan (COZAAR) 25 MG tablet Take 0.5 tablets (12.5 mg total) by mouth daily. Patient not taking: Reported on 02/07/2018 07/26/17   Sueanne Margarita, MD  Multiple Vitamin (MULTIVITAMIN WITH MINERALS) TABS Take 1 tablet by mouth daily.    [provider]  omeprazole (PRILOSEC) 20 MG capsule Take 20 mg by mouth daily.    [provider]  spironolactone (ALDACTONE) 25 MG tablet TAKE 1/2 TABLET ONCE A DAY 06/21/17   Sueanne Margarita, MD  warfarin (COUMADIN) 5 MG tablet TAKE 1/2 TO 1 TABLET DAILY AS DIRECTED BY ANTICOAGULATION CLINIC 07/05/17   Sueanne Margarita, MD     Allergies:    Allergies  Allergen Reactions  . Codeine   . Tramadol Nausea Only    Social History:   Social History   Socioeconomic History  . Marital status: Married    Spouse name: Not on file  . Number of children: Not on file  . Years of education: Not on file  . Highest education level: Not on file  Occupational History  . Not on file  Social Needs  . Financial resource strain: Not on file  . Food insecurity:    Worry: Not on file    Inability: Not on file  . Transportation needs:    Medical: Not on file    Non-medical: Not on file  Tobacco Use  . Smoking status: Never Smoker  . Smokeless tobacco: Never Used  Substance and Sexual Activity  . Alcohol use: No  . Drug use: No  . Sexual activity: Not on file  Lifestyle  . Physical activity:    Days per week: Not on file    Minutes per session: Not on file  . Stress: Not on file  Relationships  . Social connections:    Talks on phone: Not on file    Gets together: Not on file    Attends religious service: Not on file    Active member of club or organization: Not on file    Attends meetings  of clubs or organizations: Not on file    Relationship status: Not on file  . Intimate partner violence:    Fear of current or ex partner: Not on file    Emotionally abused: Not on file    Physically abused: Not on file    Forced sexual activity: Not on file  Other Topics Concern  . Not on file  Social History Narrative  . Not on file    Family History:   The patient's family history  includes Hypertension in her father and mother.    ROS:  Please see the history of present illness.  All other ROS reviewed and negative.     Physical Exam/Data:   Vitals:   02/07/18 1222  BP: 121/89  Pulse: 62  Resp: 17  SpO2: 97%   No intake or output data in the 24 hours ending 02/07/18 1223 There were no vitals filed for this visit. There is no height or weight on file to calculate BMI.  General:  Well nourished, well developed, in no acute distress HEENT: normal Lymph: no adenopathy Neck: no JVD Endocrine:  No thryomegaly Vascular: No carotid bruits  Cardiac:  IRRR; tachycardic no murmurs, gallops or rubs Lungs:  CTA b/l, no wheezing, rhonchi or rales  Abd: soft, nontender  Ext: no edema Musculoskeletal:  No deformities, age appropriate atrophy Skin: warm and dry  Neuro:  No gross focal abnormalities noted Psych:  Normal affect    EKG:  The ECG that was done was personally reviewed and demonstrates  Afib 150bpm, QT is difficult to assess given AF/RVR 01/11/18: APaced 70bpm, 1st degree AVblock, PR 360ms, QRS 136ms, QTc 461ms  Relevant CV Studies:  12/07/17: TTE Study Conclusions - Left ventricle: The cavity size was normal. There was mild focal   basal hypertrophy of the septum. Systolic function was mildly to   moderately reduced. The estimated ejection fraction was in the   range of 40% to 45%. Mild diffuse hypokinesis. Features are   consistent with a pseudonormal left ventricular filling pattern,   with concomitant abnormal relaxation and increased filling   pressure  (grade 2 diastolic dysfunction). - Aortic valve: Trileaflet; mildly thickened, mildly calcified   leaflets. - Mitral valve: Calcified annulus. There was mild to moderate   regurgitation. - Left atrium: The atrium was severely dilated. (67mm)   Laboratory Data:  Chemistry Recent Labs  Lab 02/07/18 1014  NA 141  K 4.7  CL 111  CO2 20*  GLUCOSE 113*  BUN 27*  CREATININE 1.33*  CALCIUM 10.3  GFRNONAA 36*  GFRAA 42*  ANIONGAP 10    No results for input(s): PROT, ALBUMIN, AST, ALT, ALKPHOS, BILITOT in the last 168 hours. HematologyNo results for input(s): WBC, RBC, HGB, HCT, MCV, MCH, MCHC, RDW, PLT in the last 168 hours. Cardiac EnzymesNo results for input(s): TROPONINI in the last 168 hours. No results for input(s): TROPIPOC in the last 168 hours.  BNPNo results for input(s): BNP, PROBNP in the last 168 hours.  DDimer No results for input(s): DDIMER in the last 168 hours.  Radiology/Studies:  No results found.  Assessment and Plan:   1. Persistent AFib     CHA2DS2Vasc is 5, on warfarin (pharmacy to manage during stay)     LA size may make maintaining SR difficult     K+ 4.7     Mag 1.6, replacement ordered     Creat 1.33 (Calc CrCl is 43), will start with 267mcg dosing     EKG is sinus with correct QTc 475ms, I will review with Dr. [name] further     INR 2.4  2. RVR     Off all rate control meds for a few days now, not taking her coreg with low BP at home      3. HTN     Pt reports relative hypotension at home with her AFib     Looks OK here to day, resume her coreg and follow without ARB for now  4. CM  Thought to be tachy-mediated     She does not appear to be fluid OL     Resume coreg for better rate control  5. PPM      BSci          For questions or updates, please contact [place] Please consult [email or web address] for contact info under Cardiology/STEMI.    Signed, Baldwin Jamaica, PA-C  02/07/2018 12:23 PM   EP attending  Patient seen  and examined.  Agree with the findings as noted above.  The patient presents today for initiation of dofetilide.  Electrolytes are within satisfactory limits and her prior QT interval during sinus rhythm is also acceptable.  The patient is in rapid, atypical atrial flutter today with a variable ventricular response making her QT interval very difficult to measure.  She will be admitted to the hospital and initiated on dofetilide therapy based on her renal function.  If she has not converted to sinus rhythm, we will plan DC cardioversion in approximately 2 days.  We will follow her renal function, electrolytes, and twelve-lead EKG carefully.  [name], MD

## 2018-02-07 NOTE — Patient Instructions (Signed)
Description   Continue same dose 1/2 tablet everyday except 1 tablet on Mondays, Wednesdays and Fridays.   Recheck in 2 weeks. Call us with any medication changes or concerns # [phone] [facility], Main # [phone].

## 2018-02-07 NOTE — Progress Notes (Signed)
ANTICOAGULATION CONSULT NOTE - Initial Consult  Pharmacy Consult for Warfarin Indication: atrial fibrillation  Allergies  Allergen Reactions  . Codeine Nausea Only  . Tramadol Nausea Only    Patient Measurements: Height: 5\' 5"  (165.1 cm) Weight: 186 lb 9.6 oz (84.6 kg) IBW/kg (Calculated) : 57   Vital Signs: Temp: 98.2 F (36.8 C) (04/15 1213) Temp Source: Oral (04/15 1213) BP: 121/89 (04/15 1222) Pulse Rate: 130 (04/15 1213)  Labs: Recent Labs    02/07/18 0945 02/07/18 1014  INR 2.4  --   CREATININE  --  1.33*    Estimated Creatinine Clearance: 35 mL/min (A) (by C-G formula based on SCr of 1.33 mg/dL (H)).   Medical History: Past Medical History:  Diagnosis Date  . Aortic insufficiency    mild by echo 2017  . Cancer ([place])    Skin cancer- basal.  1 mylenoma  . Complication of anesthesia   . DCM (dilated cardiomyopathy) ([place])    EF 40-45% by echo 2017 - nuclear stress test with no ischemia  . GERD (gastroesophageal reflux disease)   . H/O benign essential tremor    on amiodarone resolved off amio  . H/O hyperkalemia    Resolved off ACE inhibitors  . High cholesterol   . History of blood transfusion   . Hyperparathyroidism ([place])   . Hypertension   . Mitral regurgitation    mild to moderate by echo 2017  . Persistent atrial fibrillation ([place])   . PONV (postoperative nausea and vomiting)   . PVC's (premature ventricular contractions)   . Renal insufficiency    MILD  . Shortness of breath    with exertion  . Tachycardia-bradycardia syndrome [facility])    s/p PPM 02/2013    Medications:  Medications Prior to Admission  Medication Sig Dispense Refill Last Dose  . acetaminophen (TYLENOL) 500 MG tablet Take 1,000 mg by mouth every 6 (six) hours as needed. For pain   02/06/2018 at Unknown time  . atorvastatin (LIPITOR) 10 MG tablet Take 10 mg by mouth every evening.   02/06/2018 at Unknown time  . dorzolamide (TRUSOPT) 2 % ophthalmic solution Place 1 drop into  the right eye 2 (two) times daily.   02/06/2018 at Unknown time  . LORazepam (ATIVAN) 0.5 MG tablet Take 0.5 tablets by mouth at bedtime as needed.  0 02/06/2018 at Unknown time  . Multiple Vitamin (MULTIVITAMIN WITH MINERALS) TABS Take 1 tablet by mouth daily.   Past Week at Unknown time  . omeprazole (PRILOSEC) 20 MG capsule Take 20 mg by mouth daily.   02/06/2018 at Unknown time  . spironolactone (ALDACTONE) 25 MG tablet TAKE 1/2 TABLET ONCE A DAY 30 tablet 10 02/06/2018 at Unknown time  . warfarin (COUMADIN) 5 MG tablet TAKE 1/2 TO 1 TABLET DAILY AS DIRECTED BY ANTICOAGULATION CLINIC 30 tablet 3 02/06/2018 at Unknown time  . carvedilol (COREG) 3.125 MG tablet Take 1 tablet (3.125 mg total) by mouth 2 (two) times daily with a meal. (Patient not taking: Reported on 02/07/2018) 180 tablet 3 01/31/2018 at am  . losartan (COZAAR) 25 MG tablet Take 0.5 tablets (12.5 mg total) by mouth daily. (Patient not taking: Reported on 02/07/2018) 90 tablet 3 01/30/2018   Scheduled:  . atorvastatin  10 mg Oral QPM  . carvedilol  3.125 mg Oral BID WC  . dofetilide  250 mcg Oral BID  . dorzolamide  1 drop Right Eye BID  . multivitamin with minerals  1 tablet Oral Daily  . pantoprazole  40 mg Oral Daily  . sodium chloride flush  3 mL Intravenous Q12H  . spironolactone  12.5 mg Oral Daily  . warfarin  5 mg Oral Q M,W,F-1800   And  . [START ON 02/08/2018] warfarin  2.5 mg Oral Q T,Th,S,Su-1800  . Warfarin - Pharmacist Dosing Inpatient   Does not apply q1800    Assessment: 84 y.o female with h/o PAF on warfarin prior to admission.  INR therapeutic = 2.4 today at anticoagulation clinic visit.  Presents to [facility] today 02/07/18 for Tikosyn initiation for PAF and has failed Sotalol and amiodarone.   Goal of Therapy:  INR 2-3 Monitor platelets by anticoagulation protocol: Yes   Plan:  Continue her PTA Warfarin dose 5 mg every MWF and 2.5 mg qTTSS.  Dose due today is 5mg .  Daily PT/INR.  Nicole Cella, [place] Clinical  Pharmacist Pager: [phone] or [phone]) Main Rx [phone] 02/07/2018,4:29 PM

## 2018-02-07 NOTE — Progress Notes (Signed)
Primary Care Physician: Antony Contras, MD Referring Physician:Dr. [name] is a 84 y.o. female with a h/o PPM,afib   failing amiodarone and most recently  sotalol. She stopped sotalol last week and is in afib at 150 bpm this am. She is pending admission this am for tikosyn per Dr. [name].  INR this am was therapeutic.   Today, she denies symptoms of palpitations, chest pain, shortness of breath, orthopnea, PND, lower extremity edema, dizziness, presyncope, syncope, or neurologic sequela. The patient is tolerating medications without difficulties and is otherwise without complaint today.   Past Medical History:  Diagnosis Date  . Aortic insufficiency    mild by echo 2017  . Cancer ([place])    Skin cancer- basal.  1 mylenoma  . Complication of anesthesia   . DCM (dilated cardiomyopathy) ([place])    EF 40-45% by echo 2017 - nuclear stress test with no ischemia  . GERD (gastroesophageal reflux disease)   . H/O benign essential tremor    on amiodarone resolved off amio  . H/O hyperkalemia    Resolved off ACE inhibitors  . High cholesterol   . History of blood transfusion   . Hyperparathyroidism ([place])   . Hypertension   . Mitral regurgitation    mild to moderate by echo 2017  . Persistent atrial fibrillation ([place])   . PONV (postoperative nausea and vomiting)   . PVC's (premature ventricular contractions)   . Renal insufficiency    MILD  . Shortness of breath    with exertion  . Tachycardia-bradycardia syndrome [facility])    s/p PPM 02/2013   Past Surgical History:  Procedure Laterality Date  . AMPUTATION Right 11/27/2014   Procedure: RIGHT 2ND AND 3RD TOE AMPUTATION;  Surgeon: Wylene Simmer, MD;  Location: [place];  Service: Orthopedics;  Laterality: Right;  . BUNIONECTOMY     Right  . CARDIAC CATHETERIZATION  09/25/2000   EF of 50% -- with normal left ventricular size and function  . CARDIOVERSION N/A 12/28/2017   Procedure: CARDIOVERSION;  Surgeon: Dorothy Spark, MD;   Location: [facility] ENDOSCOPY;  Service: Cardiovascular;  Laterality: N/A;  . CESAREAN SECTION    . CESAREAN SECTION    . EYE SURGERY Bilateral    Cataract  . FOOT SURGERY    . INSERT / REPLACE / REMOVE PACEMAKER  02/2013  . PACEMAKER INSERTION  02/2013  . PERMANENT PACEMAKER INSERTION N/A 03/02/2013   Procedure: PERMANENT PACEMAKER INSERTION;  Surgeon: Evans Lance, MD;  Location: [facility] CATH LAB;  Service: Cardiovascular;  Laterality: N/A;  . SHOULDER ARTHROSCOPY W/ ROTATOR CUFF REPAIR    . SHOULDER SURGERY Right    roto cuff  . TOE AMPUTATION Left    2nd and 3rd, bunion under toes.  . TONSILLECTOMY    . VARICOSE VEIN SURGERY      Current Outpatient Medications  Medication Sig Dispense Refill  . acetaminophen (TYLENOL) 500 MG tablet Take 1,000 mg by mouth every 6 (six) hours as needed. For pain    . atorvastatin (LIPITOR) 10 MG tablet Take 10 mg by mouth every evening.    . dorzolamide (TRUSOPT) 2 % ophthalmic solution Place 1 drop into the right eye 2 (two) times daily.    Marland Kitchen LORazepam (ATIVAN) 0.5 MG tablet Take 0.5 tablets by mouth at bedtime as needed.  0  . Multiple Vitamin (MULTIVITAMIN WITH MINERALS) TABS Take 1 tablet by mouth daily.    Marland Kitchen omeprazole (PRILOSEC) 20 MG capsule Take 20  mg by mouth daily.    Marland Kitchen spironolactone (ALDACTONE) 25 MG tablet TAKE 1/2 TABLET ONCE A DAY 30 tablet 10  . warfarin (COUMADIN) 5 MG tablet TAKE 1/2 TO 1 TABLET DAILY AS DIRECTED BY ANTICOAGULATION CLINIC 30 tablet 3  . carvedilol (COREG) 3.125 MG tablet Take 1 tablet (3.125 mg total) by mouth 2 (two) times daily with a meal. (Patient not taking: Reported on 02/07/2018) 180 tablet 3  . losartan (COZAAR) 25 MG tablet Take 0.5 tablets (12.5 mg total) by mouth daily. (Patient not taking: Reported on 02/07/2018) 90 tablet 3   No current facility-administered medications for this encounter.     Allergies  Allergen Reactions  . Codeine   . Tramadol Nausea Only    Social History   Socioeconomic History  .  Marital status: Married    Spouse name: Not on file  . Number of children: Not on file  . Years of education: Not on file  . Highest education level: Not on file  Occupational History  . Not on file  Social Needs  . Financial resource strain: Not on file  . Food insecurity:    Worry: Not on file    Inability: Not on file  . Transportation needs:    Medical: Not on file    Non-medical: Not on file  Tobacco Use  . Smoking status: Never Smoker  . Smokeless tobacco: Never Used  Substance and Sexual Activity  . Alcohol use: No  . Drug use: No  . Sexual activity: Not on file  Lifestyle  . Physical activity:    Days per week: Not on file    Minutes per session: Not on file  . Stress: Not on file  Relationships  . Social connections:    Talks on phone: Not on file    Gets together: Not on file    Attends religious service: Not on file    Active member of club or organization: Not on file    Attends meetings of clubs or organizations: Not on file    Relationship status: Not on file  . Intimate partner violence:    Fear of current or ex partner: Not on file    Emotionally abused: Not on file    Physically abused: Not on file    Forced sexual activity: Not on file  Other Topics Concern  . Not on file  Social History Narrative  . Not on file    Family History  Problem Relation Age of Onset  . Hypertension Mother   . Hypertension Father     ROS- All systems are reviewed and negative except as per the HPI above  Physical Exam: Vitals:   02/07/18 1031  BP: (!) 148/84  Pulse: (!) 150  Weight: 187 lb 6.4 oz (85 kg)  Height: 5' 5.5" (1.664 m)   Wt Readings from Last 3 Encounters:  02/07/18 187 lb 6.4 oz (85 kg)  01/11/18 188 lb (85.3 kg)  12/28/17 188 lb 6.4 oz (85.5 kg)    Labs: Lab Results  Component Value Date   NA 141 02/07/2018   K 4.7 02/07/2018   CL 111 02/07/2018   CO2 20 (L) 02/07/2018   GLUCOSE 113 (H) 02/07/2018   BUN 27 (H) 02/07/2018    CREATININE 1.33 (H) 02/07/2018   CALCIUM 10.3 02/07/2018   PHOS 3.2 02/23/2017   MG 1.6 (L) 02/07/2018   Lab Results  Component Value Date   INR 2.4 02/07/2018   Lab Results  Component  Value Date   CHOL 156 05/23/2012   HDL 80 05/23/2012   LDLCALC 59 05/23/2012   TRIG 86 05/23/2012     GEN- The patient is well appearing, alert and oriented x 3 today.   Head- normocephalic, atraumatic Eyes-  Sclera clear, conjunctiva pink Ears- hearing intact Oropharynx- clear Neck- supple, no JVP Lymph- no cervical lymphadenopathy Lungs- Clear to ausculation bilaterally, normal work of breathing Heart- Rapid, irregular rate and rhythm, no murmurs, rubs or gallops, PMI not laterally displaced GI- soft, NT, ND, + BS Extremities- no clubbing, cyanosis, or edema MS- no significant deformity or atrophy Skin- no rash or lesion Psych- euthymic mood, full affect Neuro- strength and sensation are intact  EKG-afib at 150 bpm, LAD, NSIV block, qrs int 130 bpm, qtc 508 ms    Assessment and Plan: 1. Persistent  afib Failed amiodarone and most recently sotalol has not taken any since last Thursday INR this am therapeutic, Dr. [name] approved being without 4 weeks therapeutic INR's No benadryl use Pt assistance forms given to pt as she feels she will have to pay over $100 dollars for the drug Bmet/mag this am with Kt at 4.7, mag low at 1.6 and will need to be repleted, Renee aware Crcl calculated at 43.67, will only be able to take 250 mcg of tikoyn bid PharmD reviewed drugs qtc in rapid flutter at 508 ms today, in SR in March 451 ms  To [place]. Molly Savarino, [facility] [address] [place], [place] [address] [phone]

## 2018-02-07 NOTE — Progress Notes (Signed)
Pharmacy Review for Dofetilide (Tikosyn) Initiation  Admit Complaint: 84 y.o. female admitted 02/07/2018 with atrial fibrillation to be initiated on dofetilide.   Assessment:  Patient Exclusion Criteria: If any screening criteria checked as "Yes", then  patient  should NOT receive dofetilide until criteria item is corrected. If "Yes" please indicate correction plan.  YES  NO Patient  Exclusion Criteria Correction Plan  [x]  []  Baseline QTc interval is greater than or equal to 440 msec. IF above YES box checked dofetilide contraindicated unless patient has ICD; then may proceed if QTc 500-550 msec or with known ventricular conduction abnormalities may proceed with QTc 550-600 msec. QTc =  508 Cardiology PA notes that EKGs (today's and 01/11/18) and labs reviewed with Dr. [name]. Last sinus/A paced rhythm EKG QTc is acceptable to start Tikosyn tonight    [x]  []  Magnesium level is less than 1.8 mEq/l : Last magnesium:  Lab Results  Component Value Date   MG 1.6 (L) 02/07/2018       Replacing with Mag Sulfate 2g IVPB x1 -given today @ 14:40. No need to recheck prior to starting Tikosyn tonight per Card PA/MD.    []  [x]  Potassium level is less than 4 mEq/l : Last potassium:  Lab Results  Component Value Date   K 4.7 02/07/2018         []  [x]  Patient is known or suspected to have a digoxin level greater than 2 ng/ml: No results found for: DIGOXIN    []  [x]  Creatinine clearance less than 20 ml/min (calculated using Cockcroft-Gault, actual body weight and serum creatinine): Estimated Creatinine Clearance: 35 mL/min (A) (by C-G formula based on SCr of 1.33 mg/dL (H)).  Cardiologist noted calculated CrCl is 43 ml/min.  []  [x]  Patient has received drugs known to prolong the QT intervals within the last 48 hours (phenothiazines, tricyclics or tetracyclic antidepressants, erythromycin, H-1 antihistamines, cisapride, fluoroquinolones, azithromycin). Drugs not listed above may have an, as yet,  undetected potential to prolong the QT interval, updated information on QT prolonging agents is available at this website:QT prolonging agents   []  [x]  Patient received a dose of hydrochlorothiazide (Oretic) alone or in any combination including triamterene (Dyazide, Maxzide) in the last 48 hours.   []  [x]  Patient received a medication known to increase dofetilide plasma concentrations prior to initial dofetilide dose:  . Trimethoprim (Primsol, Proloprim) in the last 36 hours . Verapamil (Calan, Verelan) in the last 36 hours or a sustained release dose in the last 72 hours . Megestrol (Megace) in the last 5 days  . Cimetidine (Tagamet) in the last 6 hours . Ketoconazole (Nizoral) in the last 24 hours . Itraconazole (Sporanox) in the last 48 hours  . Prochlorperazine (Compazine) in the last 36 hours    []  [x]  Patient is known to have a history of torsades de pointes; congenital or acquired long QT syndromes.   []  [x]  Patient has received a Class 1 antiarrhythmic with less than 2 half-lives since last dose. (Disopyramide, Quinidine, Procainamide, Lidocaine, Mexiletine, Flecainide, Propafenone)   []  [x]  Patient has received amiodarone therapy in the past 3 months or amiodarone level is greater than 0.3 ng/ml.    Patient has been appropriately anticoagulated with Warfarin.  Ordering provider was confirmed at [email or web address] if they are not listed on the [place] Prescribers list.  Goal of Therapy: Follow renal function, electrolytes, potential drug interactions, and dose adjustment. Provide education and 1 week supply at discharge.  Plan:  []   Physician selected initial dose  within range recommended for patients level of renal function - will monitor for response.  [x]   Physician selected initial dose outside of range recommended for patients level of renal function - will discuss if the dose should be altered at this time.   Cardiologist noted Creat 1.33 (Calc CrCl is 43 ml/min)  and will start with 25mcg dosing.  [facility] Calculated CrCl  Dose q12h  []  > 60 ml/min 500 mcg  [x]  40-60 ml/min 250 mcg  []  20-40 ml/min 125 mcg   2. Follow up QTc after the first 5 doses, renal function, electrolytes (K & Mg) daily x 3     days, dose adjustment, success of initiation and facilitate 1 week discharge supply as     clinically indicated.  3. Initiate Tikosyn education video (Call [phone] and ask for Tikosyn Video # 116).  4. Place Enrollment Form on the chart for discharge supply of dofetilide.  Nicole Cella, [place] Clinical Pharmacist [phone] or [phone]) Main Rx [phone]   3:43 PM 02/07/2018

## 2018-02-08 LAB — BASIC METABOLIC PANEL
Anion gap: 9 (ref 5–15)
BUN: 28 mg/dL — AB (ref 6–20)
CALCIUM: 9.9 mg/dL (ref 8.9–10.3)
CO2: 20 mmol/L — ABNORMAL LOW (ref 22–32)
Chloride: 111 mmol/L (ref 101–111)
Creatinine, Ser: 1.26 mg/dL — ABNORMAL HIGH (ref 0.44–1.00)
GFR calc Af Amer: 45 mL/min — ABNORMAL LOW (ref >60)
GFR, EST NON AFRICAN AMERICAN: 39 mL/min — AB (ref >60)
Glucose, Bld: 111 mg/dL — ABNORMAL HIGH (ref 65–99)
POTASSIUM: 4.3 mmol/L (ref 3.5–5.1)
SODIUM: 140 mmol/L (ref 135–145)

## 2018-02-08 LAB — PROTIME-INR
INR: 2.33
PROTHROMBIN TIME: 25.4 s — AB (ref 11.4–15.2)

## 2018-02-08 LAB — MAGNESIUM: Magnesium: 2.1 mg/dL (ref 1.7–2.4)

## 2018-02-08 MED ORDER — SODIUM CHLORIDE 0.9% FLUSH
3.0000 mL | Freq: Two times a day (BID) | INTRAVENOUS | Status: DC
  Administered 2018-02-09 – 2018-02-10 (×3): 3 mL via INTRAVENOUS

## 2018-02-08 MED ORDER — METOPROLOL TARTRATE 5 MG/5ML IV SOLN: 5.0000 mg | Freq: Four times a day (QID) | INTRAVENOUS | Status: DC | PRN

## 2018-02-08 MED ORDER — HYDROCORTISONE 1 % EX CREA
1.0000 "application " | TOPICAL_CREAM | Freq: Three times a day (TID) | CUTANEOUS | Status: DC | PRN
  Filled 2018-02-08: qty 28

## 2018-02-08 MED ORDER — SODIUM CHLORIDE 0.9% FLUSH: 3.0000 mL | INTRAVENOUS | Status: DC | PRN

## 2018-02-08 MED ORDER — SODIUM CHLORIDE 0.9 % IV SOLN: 250.0000 mL | INTRAVENOUS | Status: DC

## 2018-02-08 NOTE — Progress Notes (Signed)
QTc 3 hours post 1st dose of Tikosyn 504.  Will continue to monitor. Jessie Foot, RN

## 2018-02-08 NOTE — Progress Notes (Signed)
Notified by CCMD pt had 8 bts NSVT. Pt without complaints/asymptomatic. Strip saved per CCMD. Will continue to monitor. Jessie Foot, RN

## 2018-02-08 NOTE — Progress Notes (Signed)
Pt converted from AFIB 1 teens-120's to APaced/VPOD at 2306. QTc post 3rd dose of Tikosyn 454. Will continue to monitor rhythm and keep pt NPO (if DCCV needed tomorrow). Jessie Foot, RN

## 2018-02-08 NOTE — Progress Notes (Signed)
[place] for Warfarin Indication: atrial fibrillation  Allergies  Allergen Reactions  . Codeine Nausea Only  . Tramadol Nausea Only    Patient Measurements: Height: 5\' 5"  (165.1 cm) Weight: 185 lb 9.6 oz (84.2 kg) IBW/kg (Calculated) : 57   Vital Signs: Temp: 97.8 F (36.6 C) (04/16 1415) Temp Source: Oral (04/16 1415) BP: 106/71 (04/16 1415) Pulse Rate: 84 (04/16 1415)  Labs: Recent Labs    02/07/18 0945 02/07/18 1014 02/08/18 0600  LABPROT  --   --  25.4*  INR 2.4  --  2.33  CREATININE  --  1.33* 1.26*    Estimated Creatinine Clearance: 36.9 mL/min (A) (by C-G formula based on SCr of 1.26 mg/dL (H)).   Medical History: Past Medical History:  Diagnosis Date  . Aortic insufficiency    mild by echo 2017  . Cancer ([place])    Skin cancer- basal.  1 mylenoma  . Complication of anesthesia   . DCM (dilated cardiomyopathy) ([place])    EF 40-45% by echo 2017 - nuclear stress test with no ischemia  . GERD (gastroesophageal reflux disease)   . H/O benign essential tremor    on amiodarone resolved off amio  . H/O hyperkalemia    Resolved off ACE inhibitors  . High cholesterol   . History of blood transfusion   . Hyperparathyroidism ([place])   . Hypertension   . Mitral regurgitation    mild to moderate by echo 2017  . Persistent atrial fibrillation ([place])   . PONV (postoperative nausea and vomiting)   . PVC's (premature ventricular contractions)   . Renal insufficiency    MILD  . Shortness of breath    with exertion  . Tachycardia-bradycardia syndrome [facility])    s/p PPM 02/2013    Medications:  Medications Prior to Admission  Medication Sig Dispense Refill Last Dose  . acetaminophen (TYLENOL) 500 MG tablet Take 1,000 mg by mouth every 6 (six) hours as needed. For pain   02/06/2018 at Unknown time  . atorvastatin (LIPITOR) 10 MG tablet Take 10 mg by mouth every evening.   02/06/2018 at Unknown time  . dorzolamide (TRUSOPT) 2 %  ophthalmic solution Place 1 drop into the right eye 2 (two) times daily.   02/06/2018 at Unknown time  . LORazepam (ATIVAN) 0.5 MG tablet Take 0.5 tablets by mouth at bedtime as needed.  0 02/06/2018 at Unknown time  . Multiple Vitamin (MULTIVITAMIN WITH MINERALS) TABS Take 1 tablet by mouth daily.   Past Week at Unknown time  . omeprazole (PRILOSEC) 20 MG capsule Take 20 mg by mouth daily.   02/06/2018 at Unknown time  . spironolactone (ALDACTONE) 25 MG tablet TAKE 1/2 TABLET ONCE A DAY 30 tablet 10 02/06/2018 at Unknown time  . warfarin (COUMADIN) 5 MG tablet TAKE 1/2 TO 1 TABLET DAILY AS DIRECTED BY ANTICOAGULATION CLINIC 30 tablet 3 02/06/2018 at Unknown time  . carvedilol (COREG) 3.125 MG tablet Take 1 tablet (3.125 mg total) by mouth 2 (two) times daily with a meal. (Patient not taking: Reported on 02/07/2018) 180 tablet 3 01/31/2018 at am  . losartan (COZAAR) 25 MG tablet Take 0.5 tablets (12.5 mg total) by mouth daily. (Patient not taking: Reported on 02/07/2018) 90 tablet 3 01/30/2018   Scheduled:  . atorvastatin  10 mg Oral QPM  . carvedilol  3.125 mg Oral BID WC  . dofetilide  250 mcg Oral BID  . dorzolamide  1 drop Right Eye BID  . multivitamin  with minerals  1 tablet Oral Daily  . pantoprazole  40 mg Oral Daily  . sodium chloride flush  3 mL Intravenous Q12H  . sodium chloride flush  3 mL Intravenous Q12H  . spironolactone  12.5 mg Oral Daily  . warfarin  2.5 mg Oral Q T,Th,S,Su-1800  . Warfarin - Pharmacist Dosing Inpatient   Does not apply q1800    Assessment: 84 y.o female with h/o PAF on warfarin prior to admission.  INR therapeutic = 2.4 today at anticoagulation clinic visit.  [facility]07[facility]18 for Tikosyn initiation for PAF.  Today the INR remains therapeutic at 2.33 on her usual home dosage. No bleeding noted.    Goal of Therapy:  INR 2-3 Monitor platelets by anticoagulation protocol: Yes   Plan:  Continue her PTA Warfarin dose 5 mg every MWF and 2.5 mg qTTSS.  Dose due  today is 2.5 mg.  Daily PT/INR.  Nicole Cella, [place] Clinical Pharmacist Pager: [phone] or [phone] (330p-1030p) Main Rx [phone] 02/08/2018,2:45 PM

## 2018-02-08 NOTE — Care Management Note (Signed)
Case Management Note  Patient Details  Name: MARYALICE PASLEY MRN: [id number] Date of Birth: 25-Apr-1933  Subjective/Objective: Pt presented for Tikosyn Load: Benefits check completed and CM will make pt aware of cost. CM will assist with Rx for 7 day supply no refills and pt will need an additional original Rx with refills.                   Action/Plan: Per rep at [facility]:   Name Brand:  Medication is NOT covered   Generic:  Covered/ no auth required/ $691.78 > $326.78 co-pay/ $365 deductible-- 90 day mail order   Covered/ $197.40 until deductible is met, then co-pay is: $78.95 -- 30 day retail  Expected Discharge Date:                  Expected Discharge Plan:  Home/Self Care  In-House Referral:  NA  Discharge planning Services  CM Consult, Medication Assistance  Post Acute Care Choice:  NA Choice offered to:  NA  DME Arranged:  N/A DME Agency:  NA  HH Arranged:  NA HH Agency:  NA  Status of Service:  Completed, signed off  If discussed at [place] of Stay Meetings, dates discussed:    Additional Comments:  Bethena Roys, RN 02/08/2018, 12:14 PM

## 2018-02-08 NOTE — Progress Notes (Addendum)
Progress Note  Patient Name: Molly [name] Date of Encounter: 02/08/2018  Primary Cardiologist: Fransico Him, MD   Subjective   Tolerating med so far, aware she is not back in SR, no CP or SOB, generally tired when in AF  Inpatient Medications    Scheduled Meds: . atorvastatin  10 mg Oral QPM  . carvedilol  3.125 mg Oral BID WC  . dofetilide  250 mcg Oral BID  . dorzolamide  1 drop Right Eye BID  . multivitamin with minerals  1 tablet Oral Daily  . pantoprazole  40 mg Oral Daily  . sodium chloride flush  3 mL Intravenous Q12H  . spironolactone  12.5 mg Oral Daily  . warfarin  2.5 mg Oral Q T,Th,S,Su-1800  . Warfarin - Pharmacist Dosing Inpatient   Does not apply q1800   Continuous Infusions: . sodium chloride     PRN Meds: sodium chloride, acetaminophen, LORazepam, metoprolol tartrate, sodium chloride flush   Vital Signs    Vitals:   02/07/18 1222 02/07/18 2035 02/08/18 0542 02/08/18 0545  BP: 121/89 116/81  (!) 128/96  Pulse:  (!) 123  (!) 103  Resp: 17     Temp:  97.6 F (36.4 C)  (!) 97.3 F (36.3 C)  TempSrc:  Oral  Axillary  SpO2: 97% 100%  98%  Weight:   185 lb 9.6 oz (84.2 kg)   Height:        Intake/Output Summary (Last 24 hours) at 02/08/2018 1205 Last data filed at 02/07/2018 1754 Gross per 24 hour  Intake 530 ml  Output -  Net 530 ml   Filed Weights   02/07/18 1213 02/08/18 0542  Weight: 186 lb 9.6 oz (84.6 kg) 185 lb 9.6 oz (84.2 kg)    Telemetry    AFib, 120's-140's - Personally Reviewed  ECG    AFib 105bpm,  QT is difficult in AF,  on non-paced complex looks 390ms, corrects to 464ms - Personally Reviewed  Physical Exam   GEN: No acute distress.   Neck: No JVD Cardiac: iRRR, tachycardic, no murmurs, rubs, or gallops.  Respiratory: Clear to auscultation bilaterally. GI: Soft, nontender, non-distended  MS: No edema; No deformity. Neuro:  Nonfocal  Psych: Normal affect   Labs    Chemistry Recent Labs  Lab 02/07/18 1014  02/08/18 0600  NA 141 140  K 4.7 4.3  CL 111 111  CO2 20* 20*  GLUCOSE 113* 111*  BUN 27* 28*  CREATININE 1.33* 1.26*  CALCIUM 10.3 9.9  GFRNONAA 36* 39*  GFRAA 42* 45*  ANIONGAP 10 9     HematologyNo results for input(s): WBC, RBC, HGB, HCT, MCV, MCH, MCHC, RDW, PLT in the last 168 hours.  Cardiac EnzymesNo results for input(s): TROPONINI in the last 168 hours. No results for input(s): TROPIPOC in the last 168 hours.   BNPNo results for input(s): BNP, PROBNP in the last 168 hours.   DDimer No results for input(s): DDIMER in the last 168 hours.   Radiology    No results found.  Cardiac Studies   12/07/17: TTE Study Conclusions - Left ventricle: The cavity size was normal. There was mild focal basal hypertrophy of the septum. Systolic function was mildly to moderately reduced. The estimated ejection fraction was in the range of 40% to 45%. Mild diffuse hypokinesis. Features are consistent with a pseudonormal left ventricular filling pattern, with concomitant abnormal relaxation and increased filling pressure (grade 2 diastolic dysfunction). - Aortic valve: Trileaflet; mildly thickened, mildly  calcified leaflets. - Mitral valve: Calcified annulus. There was mild to moderate regurgitation. - Left atrium: The atrium was severely dilated. (33mm)  Patient Profile     84 y.o. female with a history of Paroxysmal AFib (has failed sotalol and amiodarone (2014), HTN, HLD, chronic CHF (systolic) suspected to be tachy-mediated, SSSx w/PPM admitted for Tikosyn initiation  Device information: BSCi dual chamber PPM implanted 03/02/13  Assessment & Plan    1. Persistent AFib     CHA2DS2Vasc is 5, on warfarin (pharmacy to manage during stay)     LA size may make maintaining SR difficult     K+ 4.3     Mag 2.1     Creat 1.26, stable     EKG is sinus with correct QTc 438ms     INR 2.33  Post Tikosyn EKG this afternoon reviewed, remains in AF, though rate with  some improvement, continue 286mcg dosing tonight DCCV tomorrow if not in SR, patient is aware, she has had previously with no questions regarding procedure, agreeable to proceed if not in SR tomorrow  2. RVR     Off all rate control meds for a few days now, not taking her coreg with low BP at home      3. HTN     Pt reports relative hypotension at home with her AFib     Looks OK here to day, continue her coreg and follow without ARB for now  4. CM     Thought to be tachy-mediated     She does not appear to be fluid OL     Resume coreg for better rate control  5. PPM      BSci    For questions or updates, please contact [place] Please consult [email or web address] for contact info under Cardiology/STEMI.      Signed, Baldwin Jamaica, PA-C  02/08/2018, 12:05 PM    EP attending  Patient seen and examined.  Agree with the findings as noted above.  The patient remains in atrial fibrillation/flutter with a rapid ventricular response.  She will continue to receive dofetilide.  I have prescribed intravenous metoprolol to help control ventricular rate.  If she has not reverted to sinus rhythm, she will undergo cardioversion tomorrow.  She will continue her systemic anticoagulation.  Despite her very rapid rate, she has very minimal dyspnea.  [name], MD

## 2018-02-09 ENCOUNTER — Encounter (HOSPITAL_COMMUNITY)
Admission: AD | Disposition: A | Discharge status: Home / Self Care | Payer: Self-pay | Source: Ambulatory Visit | Attending: Internal Medicine

## 2018-02-09 ENCOUNTER — Ambulatory Visit (HOSPITAL_COMMUNITY): Admission: RE | Admit: 2018-02-09 | Payer: Medicare Other | Source: Ambulatory Visit | Admitting: Cardiovascular Disease

## 2018-02-09 LAB — BASIC METABOLIC PANEL
Anion gap: 8 (ref 5–15)
BUN: 23 mg/dL — ABNORMAL HIGH (ref 6–20)
CHLORIDE: 110 mmol/L (ref 101–111)
CO2: 22 mmol/L (ref 22–32)
CREATININE: 1.27 mg/dL — AB (ref 0.44–1.00)
Calcium: 10.2 mg/dL (ref 8.9–10.3)
GFR calc Af Amer: 44 mL/min — ABNORMAL LOW (ref >60)
GFR calc non Af Amer: 38 mL/min — ABNORMAL LOW (ref >60)
GLUCOSE: 101 mg/dL — AB (ref 65–99)
Potassium: 4.6 mmol/L (ref 3.5–5.1)
SODIUM: 140 mmol/L (ref 135–145)

## 2018-02-09 LAB — PROTIME-INR
INR: 2.65
PROTHROMBIN TIME: 28 s — AB (ref 11.4–15.2)

## 2018-02-09 LAB — MAGNESIUM: MAGNESIUM: 1.9 mg/dL (ref 1.7–2.4)

## 2018-02-09 SURGERY — CARDIOVERSION
Anesthesia: General

## 2018-02-09 MED ORDER — WARFARIN SODIUM 2.5 MG PO TABS: 2.5000 mg | ORAL_TABLET | ORAL | Status: DC

## 2018-02-09 MED ORDER — WARFARIN SODIUM 5 MG PO TABS
5.0000 mg | ORAL_TABLET | ORAL | Status: DC
  Administered 2018-02-09: 5 mg via ORAL
  Filled 2018-02-09: qty 1

## 2018-02-09 MED ORDER — MAGNESIUM SULFATE IN D5W 1-5 GM/100ML-% IV SOLN
1.0000 g | Freq: Once | INTRAVENOUS | Status: AC
  Administered 2018-02-09: 1 g via INTRAVENOUS
  Filled 2018-02-09: qty 100

## 2018-02-09 NOTE — Progress Notes (Signed)
QTc 3 hours post Tikosyn dose per EKG; 488. Pt still in a APaced/VPOD rhythm. Pt c/o dizziness when waking up. No blurred vision reported. VSS; BP 149/91, HR 70, Temp 97.6. Will administer PRN ativan as it appears pt is very anxious. Will continue to monitor closely.  Jacqlyn Larsen, RN

## 2018-02-09 NOTE — Consult Note (Signed)
[name] CM Primary Care Navigator  02/09/2018  Molly [name] 1933/09/07 [id number]   Met with patient and husband [name]) at the bedside to identify possible discharge needs. Patientreportsthat shewas having "irregular heartbeats, dizziness and fatigue"  that had led to this admission (for Tikosyn load/ monitoring).  Patientendorses Dr.David Swayne with [facility] as her primary care provider.   [facility] shared [facility] [address] to obtain medications without any problem.   Patientreports managing her ownmedications at home straight out of the containers.  Patientmentioned thatshe was driving prior to admission but husband can providetransportation to her doctors'appointments if needed after discharge.  Patientverbalized that her husband will serve as the primary caregiver at home.  Anticipated discharge plan ishome according to patient.  Patientand husband voiced understanding to call primary care provider's officewhen she returns homefor a post discharge follow-up visit within1- 2weeksor sooner if needs arise.Patient letter (with PCP's contact number) wasprovided as a reminder.  Explained to patientand husbandabout [facility] CM services available for healthmanagement at homebutdeniesany pressing issues or needsat this time. Patientexpressed understanding to seekreferralfrom primary care providerto [facility] care management if deemed necessary andappropriate for services in the future.  [facility] care management information provided for future needs thatshe may have.   Patienthowever,opted and verbally agreed for EMMI calls to follow-up herrecovery at home.   Referral [place] calls after discharge.  Primary care provider's office is listed as providing transition of care (TOC) follow-up.   For additional questions please contact:  Edwena Felty A. Vaun Hyndman, BSN, RN-BC [facility] PRIMARY  CARE Navigator Cell: [phone]

## 2018-02-09 NOTE — Progress Notes (Addendum)
Progress Note  Patient Name: Molly [name] Date of Encounter: 02/09/2018  Primary Cardiologist: Fransico Him, MD   Subjective   Tolerating med so far, no CP or SOB, thinks she can tell her rhythm change though has not been up and around yet  Inpatient Medications    Scheduled Meds: . atorvastatin  10 mg Oral QPM  . carvedilol  3.125 mg Oral BID WC  . dofetilide  250 mcg Oral BID  . dorzolamide  1 drop Right Eye BID  . multivitamin with minerals  1 tablet Oral Daily  . pantoprazole  40 mg Oral Daily  . sodium chloride flush  3 mL Intravenous Q12H  . sodium chloride flush  3 mL Intravenous Q12H  . spironolactone  12.5 mg Oral Daily  . warfarin  5 mg Oral Q M,W,F-1800   And  . [START ON 02/10/2018] warfarin  2.5 mg Oral Q T,Th,S,Su-1800  . Warfarin - Pharmacist Dosing Inpatient   Does not apply q1800   Continuous Infusions: . sodium chloride    . sodium chloride    . magnesium sulfate 1 - 4 g bolus IVPB     PRN Meds: sodium chloride, acetaminophen, hydrocortisone cream, LORazepam, metoprolol tartrate, sodium chloride flush, sodium chloride flush   Vital Signs    Vitals:   02/08/18 1626 02/08/18 1959 02/09/18 0413 02/09/18 0846  BP: 104/63 117/77 106/66 110/68  Pulse: 91 78 70 70  Resp:  (!) 21 19   Temp: 98.5 F (36.9 C) 97.8 F (36.6 C) (!) 97.5 F (36.4 C)   TempSrc: Oral Oral Oral   SpO2: 97% 99% 98%   Weight:   185 lb 3.2 oz (84 kg)   Height:        Intake/Output Summary (Last 24 hours) at 02/09/2018 4259 Last data filed at 02/08/2018 1823 Gross per 24 hour  Intake 120 ml  Output -  Net 120 ml   Filed Weights   02/07/18 1213 02/08/18 0542 02/09/18 0413  Weight: 186 lb 9.6 oz (84.6 kg) 185 lb 9.6 oz (84.2 kg) 185 lb 3.2 oz (84 kg)    Telemetry    Reviewed by Dr. [name] and myself, A pacing, V sensed rhythm, had some PMT - Personally Reviewed  ECG    AP/VS rhythm, QTc OK to continue Tikosyn, reviewed with Dr. [name] - Personally  Reviewed  Physical Exam   GEN: No acute distress.   Neck: No JVD Cardiac: RRR, tachycardic, no murmurs, rubs, or gallops.  Respiratory: Clear to auscultation bilaterally. GI: Soft, nontender, non-distended  MS: No edema; No deformity. Neuro:  Nonfocal  Psych: Normal affect   Labs    Chemistry Recent Labs  Lab 02/07/18 1014 02/08/18 0600 02/09/18 0336  NA 141 140 140  K 4.7 4.3 4.6  CL 111 111 110  CO2 20* 20* 22  GLUCOSE 113* 111* 101*  BUN 27* 28* 23*  CREATININE 1.33* 1.26* 1.27*  CALCIUM 10.3 9.9 10.2  GFRNONAA 36* 39* 38*  GFRAA 42* 45* 44*  ANIONGAP 10 9 8      HematologyNo results for input(s): WBC, RBC, HGB, HCT, MCV, MCH, MCHC, RDW, PLT in the last 168 hours.  Cardiac EnzymesNo results for input(s): TROPONINI in the last 168 hours. No results for input(s): TROPIPOC in the last 168 hours.   BNPNo results for input(s): BNP, PROBNP in the last 168 hours.   DDimer No results for input(s): DDIMER in the last 168 hours.   Radiology    No  results found.  Cardiac Studies   12/07/17: TTE Study Conclusions - Left ventricle: The cavity size was normal. There was mild focal basal hypertrophy of the septum. Systolic function was mildly to moderately reduced. The estimated ejection fraction was in the range of 40% to 45%. Mild diffuse hypokinesis. Features are consistent with a pseudonormal left ventricular filling pattern, with concomitant abnormal relaxation and increased filling pressure (grade 2 diastolic dysfunction). - Aortic valve: Trileaflet; mildly thickened, mildly calcified leaflets. - Mitral valve: Calcified annulus. There was mild to moderate regurgitation. - Left atrium: The atrium was severely dilated. (36mm)  Patient Profile     84 y.o. female with a history of Paroxysmal AFib (has failed sotalol and amiodarone (2014), HTN, HLD, chronic CHF (systolic) suspected to be tachy-mediated, SSSx w/PPM admitted for Tikosyn  initiation  Device information: BSCi dual chamber PPM implanted 03/02/13  Assessment & Plan    1. Persistent AFib     CHA2DS2Vasc is 5, on warfarin (pharmacy to manage during stay)     LA size may make maintaining SR difficult     K+ 4.6     Mag 1.9 (replacement ordered), OK to give Tikosyn     Creat 1.27, stable     INR 2.65  Post Tikosyn EKG last night reviewed by Dr. Remi Deter, AP rhythm, QTc OK to continue same Tikosyn dosing today   2. RVR     Off all rate control meds for a few days now, not taking her coreg with low BP at home      3. HTN     Pt reports relative hypotension at home with her AFib     Looks OK here 110's typically, continue her coreg and follow without ARB for now  4. CM     Thought to be tachy-mediated     She does not appear to be fluid OL     follow BP today, will resume her low dose ARB at discharge pending her BP today back in SR  5. PPM      BSci   Anticipate discharge tomorrow   For questions or updates, please contact [place] Please consult [email or web address] for contact info under Cardiology/STEMI.     EP Attending  Patient seen and examined. Agree with above. The patient has reverted back to NSR with atrial pacing and is stable. He QT interval is acceptable. We will follow QT, replete electrolytes as needed and plan to DC home tomorrow. Usual followup.   Cai Anfinson,M.D. Signed, Baldwin Jamaica, PA-C  02/09/2018, 9:22 AM

## 2018-02-09 NOTE — Progress Notes (Signed)
[facility] for Warfarin Indication: atrial fibrillation  Allergies  Allergen Reactions  . Codeine Nausea Only  . Tramadol Nausea Only    Patient Measurements: Height: 5\' 5"  (165.1 cm) Weight: 185 lb 3.2 oz (84 kg) IBW/kg (Calculated) : 57   Vital Signs: Temp: 97.5 F (36.4 C) (04/17 0413) Temp Source: Oral (04/17 0413) BP: 110/68 (04/17 0846) Pulse Rate: 70 (04/17 0846)  Labs: Recent Labs    02/07/18 0945 02/07/18 1014 02/08/18 0600 02/09/18 0336  LABPROT  --   --  25.4* 28.0*  INR 2.4  --  2.33 2.65  CREATININE  --  1.33* 1.26* 1.27*    Estimated Creatinine Clearance: 36.6 mL/min (A) (by C-G formula based on SCr of 1.27 mg/dL (H)).   Medical History: Past Medical History:  Diagnosis Date  . Aortic insufficiency    mild by echo 2017  . Cancer ([place])    Skin cancer- basal.  1 mylenoma  . Complication of anesthesia   . DCM (dilated cardiomyopathy) ([place])    EF 40-45% by echo 2017 - nuclear stress test with no ischemia  . GERD (gastroesophageal reflux disease)   . H/O benign essential tremor    on amiodarone resolved off amio  . H/O hyperkalemia    Resolved off ACE inhibitors  . High cholesterol   . History of blood transfusion   . Hyperparathyroidism ([place])   . Hypertension   . Mitral regurgitation    mild to moderate by echo 2017  . Persistent atrial fibrillation ([place])   . PONV (postoperative nausea and vomiting)   . PVC's (premature ventricular contractions)   . Renal insufficiency    MILD  . Shortness of breath    with exertion  . Tachycardia-bradycardia syndrome [facility])    s/p PPM 02/2013    Medications:  Medications Prior to Admission  Medication Sig Dispense Refill Last Dose  . acetaminophen (TYLENOL) 500 MG tablet Take 1,000 mg by mouth every 6 (six) hours as needed. For pain   02/06/2018 at Unknown time  . atorvastatin (LIPITOR) 10 MG tablet Take 10 mg by mouth every evening.   02/06/2018 at Unknown time  .  dorzolamide (TRUSOPT) 2 % ophthalmic solution Place 1 drop into the right eye 2 (two) times daily.   02/06/2018 at Unknown time  . LORazepam (ATIVAN) 0.5 MG tablet Take 0.5 tablets by mouth at bedtime as needed.  0 02/06/2018 at Unknown time  . Multiple Vitamin (MULTIVITAMIN WITH MINERALS) TABS Take 1 tablet by mouth daily.   Past Week at Unknown time  . omeprazole (PRILOSEC) 20 MG capsule Take 20 mg by mouth daily.   02/06/2018 at Unknown time  . spironolactone (ALDACTONE) 25 MG tablet TAKE 1/2 TABLET ONCE A DAY 30 tablet 10 02/06/2018 at Unknown time  . warfarin (COUMADIN) 5 MG tablet TAKE 1/2 TO 1 TABLET DAILY AS DIRECTED BY ANTICOAGULATION CLINIC 30 tablet 3 02/06/2018 at Unknown time  . carvedilol (COREG) 3.125 MG tablet Take 1 tablet (3.125 mg total) by mouth 2 (two) times daily with a meal. (Patient not taking: Reported on 02/07/2018) 180 tablet 3 01/31/2018 at am  . losartan (COZAAR) 25 MG tablet Take 0.5 tablets (12.5 mg total) by mouth daily. (Patient not taking: Reported on 02/07/2018) 90 tablet 3 01/30/2018   Scheduled:  . atorvastatin  10 mg Oral QPM  . carvedilol  3.125 mg Oral BID WC  . dofetilide  250 mcg Oral BID  . dorzolamide  1 drop Right  Eye BID  . multivitamin with minerals  1 tablet Oral Daily  . pantoprazole  40 mg Oral Daily  . sodium chloride flush  3 mL Intravenous Q12H  . sodium chloride flush  3 mL Intravenous Q12H  . spironolactone  12.5 mg Oral Daily  . Warfarin - Pharmacist Dosing Inpatient   Does not apply q1800    Assessment: 84 y.o female with h/o PAF on warfarin prior to admission.  INR therapeutic = 2.4 today at anticoagulation clinic visit.  Presents to [facility] 02/07[facility]18 for Tikosyn initiation for PAF.  Today the INR remains therapeutic at 2.65 on her usual home dosage. No bleeding noted.  Scr 1.27 stable  Goal of Therapy:  INR 2-3 Monitor platelets by anticoagulation protocol: Yes   Plan:  Continue her PTA Warfarin dose 5 mg every MWF and 2.5 mg qTTSS.  Dose due  today is 5 mg.  Daily PT/INR.  Nicole Cella, [place] Clinical Pharmacist Pager: [phone] or [phone] (330p-1030p) Main Rx [phone] 02/09/2018,9:02 AM

## 2018-02-09 NOTE — Plan of Care (Signed)
  Problem: Clinical Measurements: Goal: Will remain free from infection Outcome: Progressing Note:  Pt has shown no signs of infection during my care.    Problem: Activity: Goal: Risk for activity intolerance will decrease Outcome: Progressing Note:  Pt is able to walk to bathroom and provide her own ADLs during my care. Spouse is at the bedside and has offered assistance as well.    Problem: Safety: Goal: Ability to remain free from injury will improve Outcome: Progressing Note:  Pt has remained free from falls and injury during my care.    Problem: Cardiac: Goal: Ability to achieve and maintain adequate cardiopulmonary perfusion will improve Outcome: Progressing Note:  Pt converted to a NSR last night around 2300, per RN report, and has maintained that rhythm during this shift.

## 2018-02-10 ENCOUNTER — Telehealth: Payer: Self-pay | Admitting: Physician Assistant

## 2018-02-10 ENCOUNTER — Telehealth: Payer: Self-pay | Admitting: Internal Medicine

## 2018-02-10 LAB — BASIC METABOLIC PANEL
ANION GAP: 8 (ref 5–15)
BUN: 21 mg/dL — ABNORMAL HIGH (ref 6–20)
CO2: 22 mmol/L (ref 22–32)
Calcium: 10.2 mg/dL (ref 8.9–10.3)
Chloride: 110 mmol/L (ref 101–111)
Creatinine, Ser: 1.19 mg/dL — ABNORMAL HIGH (ref 0.44–1.00)
GFR calc Af Amer: 48 mL/min — ABNORMAL LOW (ref >60)
GFR calc non Af Amer: 41 mL/min — ABNORMAL LOW (ref >60)
GLUCOSE: 109 mg/dL — AB (ref 65–99)
POTASSIUM: 4.8 mmol/L (ref 3.5–5.1)
Sodium: 140 mmol/L (ref 135–145)

## 2018-02-10 LAB — MAGNESIUM: MAGNESIUM: 2 mg/dL (ref 1.7–2.4)

## 2018-02-10 LAB — PROTIME-INR
INR: 3.08
Prothrombin Time: 31.6 seconds — ABNORMAL HIGH (ref 11.4–15.2)

## 2018-02-10 MED ORDER — DOFETILIDE 250 MCG PO CAPS: 250.0000 ug | ORAL_CAPSULE | Freq: Two times a day (BID) | ORAL | 4 refills | Status: DC

## 2018-02-10 NOTE — Telephone Encounter (Signed)
Returned call.  Pt now on dofetilide.  Sotalol was discontinued prior to initiation on dofetilide. Pharmacy notified.

## 2018-02-10 NOTE — Telephone Encounter (Signed)
New message      Pt c/o medication issue:  1. Name of Medication: dofetilide (TIKOSYN) 250 MCG capsule and Sotalol 2. How are you currently taking this medication (dosage and times per day)? As prescribed  3. Are you having a reaction (difficulty breathing--STAT)? No  4. What is your medication issue? Robin at [facility] calling to confirm medication dosage and possible interaction

## 2018-02-10 NOTE — Discharge Instructions (Signed)

## 2018-02-10 NOTE — Discharge Summary (Addendum)
ELECTROPHYSIOLOGY PROCEDURE DISCHARGE SUMMARY    Patient ID: Molly [name],  MRN: [id number], DOB/AGE: August 31, 1933 84 y.o.  Admit date: 02/07/2018 Discharge date: 02/10/2018  Primary Care Physician: Antony Contras, MD  Primary Cardiologist: Dr. Radford Pax Electrophysiologist: Dr. [name]  Primary Discharge Diagnosis:  1.  Paroxysmal atrial fibrillation status post Tikosyn loading this admission      CHA2DS2Vasc is 5, on warfarin  Secondary Discharge Diagnosis:  1. HTN 2. SSSx w/PPM ([facility]) 3. Chronic CHF (systolic)     Suspect to be tachy-mediated  Allergies  Allergen Reactions  . Codeine Nausea Only  . Tramadol Nausea Only     Procedures This Admission:  1.  Tikosyn loading   Brief HPI: LINDELL TUSSEY is a 84 y.o. female with a past medical history as noted above.  She is followed by EP in the outpatient setting for PPM and atrial fibrillation. Patient had increased and symptomatic AFib, was decided to pursue Tikosyn.   Risks, benefits, and alternatives to Tikosyn were reviewed with the patient who wished to proceed.    Hospital Course:  The patient's sotalol was discontinued in advance of her hospitalization, shewas admitted and Tikosyn was initiated.  Renal function and electrolytes were followed during the hospitalization, her potassium needed initial replacement though she had been up to her stay doubling up on her lasix at home.  Her QTc remained stable.  She converted to SR with drug and did not require DCCV.  The patient was monitored until discharge on telemetry which demonstrated AFib w/RVR >> SR and remained in SR to time of discharge.  Post dose EKG from day of discharge is reviewed with Dr. [name], QTc is acceptable given QRS duration.  On the day of discharge, she is feeling well, she was examined by Dr [name] who considered her stable for discharge to home.  Follow-up has been arranged with the AFib clinic in 1 week and with Dr Donnel Saxon in 4 weeks.   Physical  Exam: Vitals:   02/09/18 2249 02/10/18 0358 02/10/18 0404 02/10/18 0800  BP: (!) 149/91  132/86 126/77  Pulse:   72   Resp: 20  18   Temp: 97.6 F (36.4 C)  97.8 F (36.6 C)   TempSrc: Oral  Oral   SpO2: 98%  96%   Weight:  186 lb 8 oz (84.6 kg)    Height:        GEN- The patient is well appearing, alert and oriented x 3 today.   HEENT: normocephalic, atraumatic; sclera clear, conjunctiva pink; hearing intact; oropharynx clear; neck supple, no JVP Lymph- no cervical lymphadenopathy Lungs- CTA b/l, normal work of breathing.  No wheezes, rales, rhonchi Heart- RRR, no murmurs, rubs or gallops, PMI not laterally displaced GI- soft, non-tender, non-distended Extremities- no clubbing, cyanosis, or edema MS- no significant deformity or atrophy Skin- warm and dry, no rash or lesion Psych- euthymic mood, full affect Neuro- strength and sensation are intact   Labs:   Lab Results  Component Value Date   WBC 9.5 02/23/2017   HGB 12.6 12/28/2017   HCT 37.0 12/28/2017   MCV 105.4 (H) 02/23/2017   PLT 214 02/23/2017    Recent Labs  Lab 02/10/18 0329  NA 140  K 4.8  CL 110  CO2 22  BUN 21*  CREATININE 1.19*  CALCIUM 10.2  GLUCOSE 109*     Discharge Medications:  Allergies as of 02/10/2018      Reactions   Codeine Nausea Only  Tramadol Nausea Only      Medication List    TAKE these medications   acetaminophen 500 MG tablet Commonly known as:  TYLENOL Take 1,000 mg by mouth every 6 (six) hours as needed. For pain   atorvastatin 10 MG tablet Commonly known as:  LIPITOR Take 10 mg by mouth every evening.   carvedilol 3.125 MG tablet Commonly known as:  COREG Take 1 tablet (3.125 mg total) by mouth 2 (two) times daily with a meal.   dofetilide 250 MCG capsule Commonly known as:  TIKOSYN Take 1 capsule (250 mcg total) by mouth 2 (two) times daily.   dorzolamide 2 % ophthalmic solution Commonly known as:  TRUSOPT Place 1 drop into the right eye 2 (two) times  daily.   LORazepam 0.5 MG tablet Commonly known as:  ATIVAN Take 0.5 tablets by mouth at bedtime as needed.   losartan 25 MG tablet Commonly known as:  COZAAR Take 0.5 tablets (12.5 mg total) by mouth daily.   multivitamin with minerals Tabs tablet Take 1 tablet by mouth daily.   omeprazole 20 MG capsule Commonly known as:  PRILOSEC Take 20 mg by mouth daily.   spironolactone 25 MG tablet Commonly known as:  ALDACTONE TAKE 1/2 TABLET ONCE A DAY   warfarin 5 MG tablet Commonly known as:  COUMADIN Take as directed. If you are unsure how to take this medication, talk to your nurse or doctor. Original instructions:  TAKE 1/2 TO 1 TABLET DAILY AS DIRECTED BY ANTICOAGULATION CLINIC       Disposition:  Home Discharge Instructions    Diet - low sodium heart healthy   Complete by:  As directed    Increase activity slowly   Complete by:  As directed      Follow-up Information    [facility] Follow up on 02/22/2018.   Specialty:  Cardiology Why:  3:15PM, coumadin clinic Contact information: [address], [address] [place] Follow up on 02/17/2018.   Specialty:  Cardiology Why:  1:30PM Contact information: [address] [id number] [place]c [place]       Evans Lance, MD Follow up on 03/14/2018.   Specialty:  Cardiology Why:  11:00AM Contact information: [address] [address] [phone]           Duration of Discharge Encounter: Greater than 30 minutes including physician time.  Venetia Night, PA-C 02/10/2018 12:37 PM   EP Attending  Patient seen and examined. Agree with the findings as noted above. Her corrected QT is within limits and she is maintaining NSR. She will return in a few days for a 12 lead ECG and labs to check the BMP and Mg.  Mikle Bosworth.D.

## 2018-02-10 NOTE — Telephone Encounter (Signed)
Paged for feeling strange and shaky, called the patient and left her message to call us back.   Hilbert Corrigan PA Pager: [phone]

## 2018-02-12 ENCOUNTER — Other Ambulatory Visit: Payer: Self-pay | Admitting: Cardiology

## 2018-02-17 ENCOUNTER — Encounter (HOSPITAL_COMMUNITY): Payer: Self-pay | Admitting: Nurse Practitioner

## 2018-02-17 ENCOUNTER — Ambulatory Visit (HOSPITAL_COMMUNITY)
Admission: RE | Admit: 2018-02-17 | Discharge: 2018-02-17 | Disposition: A | Discharge status: Home / Self Care | Payer: Medicare Other | Source: Ambulatory Visit | Attending: Nurse Practitioner | Admitting: Nurse Practitioner

## 2018-02-17 ENCOUNTER — Other Ambulatory Visit: Payer: Self-pay | Admitting: *Deleted

## 2018-02-17 ENCOUNTER — Encounter: Payer: Self-pay | Admitting: *Deleted

## 2018-02-17 VITALS — BP 152/94 | HR 70 | Ht 65.0 in | Wt 190.0 lb

## 2018-02-17 DIAGNOSIS — K219 Gastro-esophageal reflux disease without esophagitis: Secondary | ICD-10-CM | POA: Insufficient documentation

## 2018-02-17 DIAGNOSIS — I420 Dilated cardiomyopathy: Secondary | ICD-10-CM | POA: Insufficient documentation

## 2018-02-17 DIAGNOSIS — I4819 Other persistent atrial fibrillation: Secondary | ICD-10-CM

## 2018-02-17 DIAGNOSIS — Z79899 Other long term (current) drug therapy: Secondary | ICD-10-CM | POA: Insufficient documentation

## 2018-02-17 DIAGNOSIS — I481 Persistent atrial fibrillation: Secondary | ICD-10-CM | POA: Diagnosis not present

## 2018-02-17 DIAGNOSIS — Z85828 Personal history of other malignant neoplasm of skin: Secondary | ICD-10-CM | POA: Insufficient documentation

## 2018-02-17 DIAGNOSIS — I340 Nonrheumatic mitral (valve) insufficiency: Secondary | ICD-10-CM | POA: Insufficient documentation

## 2018-02-17 DIAGNOSIS — E7800 Pure hypercholesterolemia, unspecified: Secondary | ICD-10-CM | POA: Insufficient documentation

## 2018-02-17 DIAGNOSIS — Z7901 Long term (current) use of anticoagulants: Secondary | ICD-10-CM | POA: Insufficient documentation

## 2018-02-17 DIAGNOSIS — Z950 Presence of cardiac pacemaker: Secondary | ICD-10-CM | POA: Diagnosis not present

## 2018-02-17 DIAGNOSIS — Z89421 Acquired absence of other right toe(s): Secondary | ICD-10-CM | POA: Insufficient documentation

## 2018-02-17 DIAGNOSIS — I10 Essential (primary) hypertension: Secondary | ICD-10-CM | POA: Insufficient documentation

## 2018-02-17 LAB — BASIC METABOLIC PANEL
Anion gap: 9 (ref 5–15)
BUN: 29 mg/dL — ABNORMAL HIGH (ref 6–20)
CHLORIDE: 108 mmol/L (ref 101–111)
CO2: 21 mmol/L — ABNORMAL LOW (ref 22–32)
CREATININE: 1.13 mg/dL — AB (ref 0.44–1.00)
Calcium: 10.4 mg/dL — ABNORMAL HIGH (ref 8.9–10.3)
GFR, EST AFRICAN AMERICAN: 51 mL/min — AB (ref >60)
GFR, EST NON AFRICAN AMERICAN: 44 mL/min — AB (ref >60)
Glucose, Bld: 177 mg/dL — ABNORMAL HIGH (ref 65–99)
POTASSIUM: 4.7 mmol/L (ref 3.5–5.1)
SODIUM: 138 mmol/L (ref 135–145)

## 2018-02-17 LAB — MAGNESIUM: MAGNESIUM: 1.6 mg/dL — AB (ref 1.7–2.4)

## 2018-02-17 NOTE — Patient Outreach (Signed)
[facility] [facility]) Care Management  02/17/2018  Molly [name] 07/13/1933 [id number]  Referral via [place] Discharge; day#1, 4/19, day#2, 4/22.2019: Reason: Unfilled prescription-"yes" Lost interest in doing things-"yes"  Per hx: Recent hospital admission 4/15-4/18/2019 Dx. paroxsymal atrial fib-on warfarin  Telephone call to patient; left HIPPA compliant voice mail requesting call back.  Plan: Geophysicist/field seismologist. Follow up 2-4 business days.  Sherrin Daisy, RN BSN [place] Management Coordinator [facility] Care Management  [phone]

## 2018-02-17 NOTE — Progress Notes (Signed)
Primary Care Physician: Antony Contras, MD Referring Physician:Dr. [name] is a 84 y.o. female with a h/o PPM,afib   failing amiodarone and most recently  sotalol. She stopped sotalol last week and is in afib at 150 bpm this am. She is pending admission this am, 4/15, from afib clinic, for [place] per Dr. [name].  INR this am was therapeutic.   F/u in afib clinic, pt is s/p loading of Tikosyn. She is A paced. She feels improved but still tired. She is taking Tikosyn correctly.   Today, she denies symptoms of palpitations, chest pain, shortness of breath, orthopnea, PND, lower extremity edema, dizziness, presyncope, syncope, or neurologic sequela. + for  The patient is tolerating medications without difficulties and is otherwise without complaint today.   Past Medical History:  Diagnosis Date  . Aortic insufficiency    mild by echo 2017  . Cancer ([place])    Skin cancer- basal.  1 mylenoma  . Complication of anesthesia   . DCM (dilated cardiomyopathy) ([place])    EF 40-45% by echo 2017 - nuclear stress test with no ischemia  . GERD (gastroesophageal reflux disease)   . H/O benign essential tremor    on amiodarone resolved off amio  . H/O hyperkalemia    Resolved off ACE inhibitors  . High cholesterol   . History of blood transfusion   . Hyperparathyroidism ([place])   . Hypertension   . Mitral regurgitation    mild to moderate by echo 2017  . Persistent atrial fibrillation ([place])   . PONV (postoperative nausea and vomiting)   . PVC's (premature ventricular contractions)   . Renal insufficiency    MILD  . Shortness of breath    with exertion  . Tachycardia-bradycardia syndrome [facility])    s/p PPM 02/2013   Past Surgical History:  Procedure Laterality Date  . AMPUTATION Right 11/27/2014   Procedure: RIGHT 2ND AND 3RD TOE AMPUTATION;  Surgeon: Wylene Simmer, MD;  Location: [facility];  Service: Orthopedics;  Laterality: Right;  . BUNIONECTOMY     Right  . CARDIAC  CATHETERIZATION  09/25/2000   EF of 50% -- with normal left ventricular size and function  . CARDIOVERSION N/A 12/28/2017   Procedure: CARDIOVERSION;  Surgeon: Dorothy Spark, MD;  Location[facility] ENDOSCOPY;  Service: Cardiovascular;  Laterality: N/A;  . CESAREAN SECTION    . CESAREAN SECTION    . EYE SURGERY Bilateral    Cataract  . FOOT SURGERY    . INSERT / REPLACE / REMOVE PACEMAKER  02/2013  . PACEMAKER INSERTION  02/2013  . PERMANENT PACEMAKER INSERTION N/A 03/02/2013   Procedure: PERMANENT PACEMAKER INSERTION;  Surgeon: Evans Lance, MD;  Location: [facility] CATH LAB;  Service: Cardiovascular;  Laterality: N/A;  . SHOULDER ARTHROSCOPY W/ ROTATOR CUFF REPAIR    . SHOULDER SURGERY Right    roto cuff  . TOE AMPUTATION Left    2nd and 3rd, bunion under toes.  . TONSILLECTOMY    . VARICOSE VEIN SURGERY      Current Outpatient Medications  Medication Sig Dispense Refill  . acetaminophen (TYLENOL) 500 MG tablet Take 1,000 mg by mouth every 6 (six) hours as needed. For pain    . atorvastatin (LIPITOR) 10 MG tablet Take 10 mg by mouth every evening.    . carvedilol (COREG) 3.125 MG tablet Take 1 tablet (3.125 mg total) by mouth 2 (two) times daily with a meal. 180 tablet 3  . dofetilide (TIKOSYN) 250  MCG capsule Take 1 capsule (250 mcg total) by mouth 2 (two) times daily. 60 capsule 4  . dorzolamide (TRUSOPT) 2 % ophthalmic solution Place 1 drop into the right eye 2 (two) times daily.    Marland Kitchen LORazepam (ATIVAN) 0.5 MG tablet Take 0.5 tablets by mouth at bedtime as needed.  0  . losartan (COZAAR) 25 MG tablet Take 0.5 tablets (12.5 mg total) by mouth daily. 90 tablet 3  . Multiple Vitamin (MULTIVITAMIN WITH MINERALS) TABS Take 1 tablet by mouth daily.    Marland Kitchen omeprazole (PRILOSEC) 20 MG capsule Take 20 mg by mouth daily.    Marland Kitchen spironolactone (ALDACTONE) 25 MG tablet TAKE 1/2 TABLET ONCE A DAY 30 tablet 10  . warfarin (COUMADIN) 5 MG tablet TAKE 1/2 TO 1 TABLET DAILY AS DIRECTED BY ANTICOAGULATION  CLINIC 30 tablet 3   No current facility-administered medications for this encounter.     Allergies  Allergen Reactions  . Codeine Nausea Only  . Tramadol Nausea Only    Social History   Socioeconomic History  . Marital status: Married    Spouse name: Not on file  . Number of children: Not on file  . Years of education: Not on file  . Highest education level: Not on file  Occupational History  . Not on file  Social Needs  . Financial resource strain: Not on file  . Food insecurity:    Worry: Not on file    Inability: Not on file  . Transportation needs:    Medical: Not on file    Non-medical: Not on file  Tobacco Use  . Smoking status: Never Smoker  . Smokeless tobacco: Never Used  Substance and Sexual Activity  . Alcohol use: No  . Drug use: No  . Sexual activity: Not Currently  Lifestyle  . Physical activity:    Days per week: Not on file    Minutes per session: Not on file  . Stress: Not on file  Relationships  . Social connections:    Talks on phone: Not on file    Gets together: Not on file    Attends religious service: Not on file    Active member of club or organization: Not on file    Attends meetings of clubs or organizations: Not on file    Relationship status: Not on file  . Intimate partner violence:    Fear of current or ex partner: Not on file    Emotionally abused: Not on file    Physically abused: Not on file    Forced sexual activity: Not on file  Other Topics Concern  . Not on file  Social History Narrative  . Not on file    Family History  Problem Relation Age of Onset  . Hypertension Mother   . Hypertension Father     ROS- All systems are reviewed and negative except as per the HPI above  Physical Exam: Vitals:   02/17/18 1350  BP: (!) 152/94  Pulse: 70  Weight: 190 lb (86.2 kg)  Height: 5\' 5"  (1.651 m)   Wt Readings from Last 3 Encounters:  02/17/18 190 lb (86.2 kg)  02/10/18 186 lb 8 oz (84.6 kg)  02/07/18 187 lb 6.4  oz (85 kg)    Labs: Lab Results  Component Value Date   NA 140 02/10/2018   K 4.8 02/10/2018   CL 110 02/10/2018   CO2 22 02/10/2018   GLUCOSE 109 (H) 02/10/2018   BUN 21 (H) 02/10/2018  CREATININE 1.19 (H) 02/10/2018   CALCIUM 10.2 02/10/2018   PHOS 3.2 02/23/2017   MG 2.0 02/10/2018   Lab Results  Component Value Date   INR 3.08 02/10/2018   Lab Results  Component Value Date   CHOL 156 05/23/2012   HDL 80 05/23/2012   LDLCALC 59 05/23/2012   TRIG 86 05/23/2012     GEN- The patient is well appearing, alert and oriented x 3 today.   Head- normocephalic, atraumatic Eyes-  Sclera clear, conjunctiva pink Ears- hearing intact Oropharynx- clear Neck- supple, no JVP Lymph- no cervical lymphadenopathy Lungs- Clear to ausculation bilaterally, normal work of breathing Heart- regular rate and rhythm, no murmurs, rubs or gallops, PMI not laterally displaced GI- soft, NT, ND, + BS Extremities- no clubbing, cyanosis, or edema MS- no significant deformity or atrophy Skin- no rash or lesion Psych- euthymic mood, full affect Neuro- strength and sensation are intact  EKG-a paced at 70 bpm, LAD, qrs int 130 ms, qtc 449 ms (stable)    Assessment and Plan: 1. Persistent  afib Failed amiodarone and most recently sotalol  Now s/p Tikosyn loading Continue dofetilide 250 mcg bid Precautions  with Tikosyn reviewed No benadryl use Warfarin  with INR check pending 4/30  F/u with Dr. [name] 5/20   Butch Penny C. Maisha Bogen, [facility] [address] [place], [place] [address] [phone]

## 2018-02-18 ENCOUNTER — Telehealth (HOSPITAL_COMMUNITY): Payer: Self-pay | Admitting: *Deleted

## 2018-02-18 ENCOUNTER — Other Ambulatory Visit: Payer: Self-pay | Admitting: *Deleted

## 2018-02-18 NOTE — Telephone Encounter (Signed)
Pt called reporting some breakthrough afib with controlled rates this morning.  HR between 96-109 not any higher so far and BP 123/71.  Pt wanted advice on if med is failing since she just started [place].  Pt was advised per DC that this does not mean med failure.  Continue taking meds as prescribed, sometimes afib will have some breakthrough and the tikosyn should put it back in place.  Monitor HR and BP and call us Monday to see if needs to be placed on sched for dccv or other option

## 2018-02-18 NOTE — Telephone Encounter (Signed)
See previous note

## 2018-02-18 NOTE — Patient Outreach (Signed)
[facility]) Care Management  02/18/2018  Molly [name] 1933/03/23 [id number]  Referral via [place] Discharge; day#1, 4/19, day#2, 4/22.2019: Reason: Unfilled prescription-"yes" Lost interest in doing things-"yes"  Per hx: Recent hospital admission 4/15-4/18/2019 Dx. paroxsymal atrial fib-on warfarin.  Received incoming call from patient who was advised of reason for call.  HIPPA verification received from patient.  Patient voices that she has had all prescriptions filled and is managing her own medications. States she is taking medication as prescribed by her MD.  Voices that she understands the importance of being consistent with taking her medications as ordered.   States she has some anxiety because her pulse is higher than it normally is. States today last BP 118/60 P109. States she had appt with afib clinic yesterday and had  normal EKG.  States she is not having shortness of breath or chest pain. Advised patient of importance of being in contact with her MD office if she has symptoms that she needs to report. States she plans to call MD office today and will take another BP / Pulse before calling.  Reenforcement given to patient regarding reporting concerns to MD office.     Patient voices no further concerns and thanked this care coordinator for call.   EMMI alert has been addressed.  Plan: Close case.  Sherrin Daisy, RN BSN CCM Care Management Coordinator [facility] Care Management  [phone]  C  Plan: Case closure.   Sherrin Daisy, RN BSN [place] Management Coordinator [facility] Care Management  [phone]

## 2018-02-22 ENCOUNTER — Other Ambulatory Visit: Payer: Self-pay | Admitting: *Deleted

## 2018-02-22 NOTE — Patient Outreach (Signed)
[place] [facility]) Care Management  02/22/2018  Molly [name] [birth year] [id number]  Referral via [place]-[facility] Discharge -4/22/ Reason: Lost of interest in things-yes;  Telephone call to patient who was advised of reason for call. HIPPA verification received.  Patient voices that she has no lost interest in things and that she is not depressed. States things are "OK" with her.   Voices she has no concerns today.   EMMI alert has been addressed.  Plan: Will close out case.  Sherrin Daisy, RN BSN [place] Management Coordinator [facility] Care Management  [phone]

## 2018-02-23 ENCOUNTER — Other Ambulatory Visit (HOSPITAL_COMMUNITY): Payer: Self-pay | Admitting: *Deleted

## 2018-02-23 ENCOUNTER — Telehealth: Payer: Self-pay | Admitting: Internal Medicine

## 2018-02-23 ENCOUNTER — Telehealth (HOSPITAL_COMMUNITY): Payer: Self-pay | Admitting: *Deleted

## 2018-02-23 NOTE — Telephone Encounter (Signed)
Patient called in stating she feels terrible. Her HR is all over the place and she is short of breath/fatigued. HR ranging between 90-140s. Instructed pt to send remote transmission to see what her rhythm is doing. She states she has been like this is basically Friday.   Shiela Mayer, RN  Juluis Mire, RN        [employer] [employer]   Remote reviewed. Presenting rhythm: AT/AFL,irregular Vs 89-150s. 38%AT/AF burden since 01/11/18. (4302) VHR episodes - AT/AFL/RVR.    Talked with patient - first available outpatient cardioversion is Monday the 6th - pt not sure she can wait until Monday - per Roderic Palau NP pt can go to ER to be cardioverted as her INR has been therapeutic since tikosyn loading. Pt unsure if she wants to do this. Another option would be to increase her coreg to 6.25mg  and see if this helps convert her. Pt is unsure of what she wants to do and wants to call me back later today with her decision. Will await call back.

## 2018-02-23 NOTE — Telephone Encounter (Signed)
New Message   Pt states that she needs to speak to a nurse because she has a decision she needs to make about getting her heart shocked. Please call

## 2018-02-23 NOTE — Telephone Encounter (Signed)
Patient called back decided she would go forth with scheduled cardioversion - opening now on 5/3. Pt will increase coreg to 6.25mg  BID until cardioversion. INR check on 5/3 prior to dccv. No subtherapeutic INRs since tikosyn admit.

## 2018-02-24 ENCOUNTER — Ambulatory Visit (HOSPITAL_COMMUNITY)
Admission: RE | Admit: 2018-02-24 | Discharge: 2018-02-24 | Disposition: A | Discharge status: Home / Self Care | Payer: Medicare Other | Source: Ambulatory Visit | Attending: Nurse Practitioner | Admitting: Nurse Practitioner

## 2018-02-24 DIAGNOSIS — I4891 Unspecified atrial fibrillation: Secondary | ICD-10-CM | POA: Diagnosis not present

## 2018-02-24 LAB — BASIC METABOLIC PANEL
Anion gap: 7 (ref 5–15)
BUN: 28 mg/dL — AB (ref 6–20)
CO2: 24 mmol/L (ref 22–32)
CREATININE: 1.25 mg/dL — AB (ref 0.44–1.00)
Calcium: 10.6 mg/dL — ABNORMAL HIGH (ref 8.9–10.3)
Chloride: 108 mmol/L (ref 101–111)
GFR calc Af Amer: 45 mL/min — ABNORMAL LOW (ref >60)
GFR calc non Af Amer: 39 mL/min — ABNORMAL LOW (ref >60)
Glucose, Bld: 107 mg/dL — ABNORMAL HIGH (ref 65–99)
POTASSIUM: 5.1 mmol/L (ref 3.5–5.1)
SODIUM: 139 mmol/L (ref 135–145)

## 2018-02-24 LAB — CBC
HCT: 35.5 % — ABNORMAL LOW (ref 36.0–46.0)
HEMOGLOBIN: 11.2 g/dL — AB (ref 12.0–15.0)
MCH: 32.6 pg (ref 26.0–34.0)
MCHC: 31.5 g/dL (ref 30.0–36.0)
MCV: 103.2 fL — AB (ref 78.0–100.0)
Platelets: 216 10*3/uL (ref 150–400)
RBC: 3.44 MIL/uL — ABNORMAL LOW (ref 3.87–5.11)
RDW: 13.9 % (ref 11.5–15.5)
WBC: 8.3 10*3/uL (ref 4.0–10.5)

## 2018-02-24 LAB — MAGNESIUM: MAGNESIUM: 1.8 mg/dL (ref 1.7–2.4)

## 2018-02-24 NOTE — H&P (View-Only) (Signed)
Pt in for EKG and lab check.  MG/CBC and  EKG to be reviewed by Roderic Palau, NP  After I saw pt one week, s/p Tikosyn load, she called to office a few days later and was in afib and has continued to be out of rhythm. I discussed with Dr. [name] and he thinks it is reasonable to try cardioversion before saying that Tikosyn is considered a failure. Ekg shows afib at 93 bpm, qrs int 132 ms, qtc 484 ms.  She is on coumadin and her INR's have been in range since January and she is due one in the am before cardioversion. Dr. [name]  did not require weekly INR's before hospitalization/ cardioversion with Tikosyn since she has been so stable on coumadin.

## 2018-02-24 NOTE — Patient Instructions (Addendum)
Cardioversion scheduled for Friday, May 3rd  - Go to coumadin clinic for INR check 10:45AM  - Arrive at the [facility] and go to admitting at 11:30AM  -Do not eat or drink anything after midnight the night prior to your procedure.  - Take all your medication with a sip of water prior to arrival.  - You will not be able to drive home after your procedure.

## 2018-02-24 NOTE — Progress Notes (Addendum)
Pt in for EKG and lab check.  MG/CBC and  EKG to be reviewed by Roderic Palau, NP  After I saw pt one week, s/p Tikosyn load, she called to office a few days later and was in afib and has continued to be out of rhythm. I discussed with Dr. [name] and he thinks it is reasonable to try cardioversion before saying that Tikosyn is considered a failure. Ekg shows afib at 93 bpm, qrs int 132 ms, qtc 484 ms.  She is on coumadin and her INR's have been in range since January and she is due one in the am before cardioversion. Dr. [name]  did not require weekly INR's before hospitalization/ cardioversion with Tikosyn since she has been so stable on coumadin.

## 2018-02-25 ENCOUNTER — Ambulatory Visit (HOSPITAL_COMMUNITY): Payer: Medicare Other | Admitting: Certified Registered Nurse Anesthetist

## 2018-02-25 ENCOUNTER — Ambulatory Visit (HOSPITAL_COMMUNITY)
Admission: RE | Admit: 2018-02-25 | Discharge: 2018-02-25 | Disposition: A | Discharge status: Home / Self Care | Payer: Medicare Other | Source: Ambulatory Visit | Attending: Cardiovascular Disease | Admitting: Cardiovascular Disease

## 2018-02-25 ENCOUNTER — Ambulatory Visit (INDEPENDENT_AMBULATORY_CARE_PROVIDER_SITE_OTHER): Payer: Medicare Other | Admitting: *Deleted

## 2018-02-25 ENCOUNTER — Encounter (HOSPITAL_COMMUNITY): Payer: Self-pay | Admitting: Cardiovascular Disease

## 2018-02-25 ENCOUNTER — Encounter (HOSPITAL_COMMUNITY)
Admission: RE | Disposition: A | Discharge status: Home / Self Care | Payer: Self-pay | Source: Ambulatory Visit | Attending: Cardiovascular Disease

## 2018-02-25 DIAGNOSIS — E039 Hypothyroidism, unspecified: Secondary | ICD-10-CM | POA: Diagnosis not present

## 2018-02-25 DIAGNOSIS — Z5181 Encounter for therapeutic drug level monitoring: Secondary | ICD-10-CM | POA: Diagnosis not present

## 2018-02-25 DIAGNOSIS — Z85828 Personal history of other malignant neoplasm of skin: Secondary | ICD-10-CM | POA: Diagnosis not present

## 2018-02-25 DIAGNOSIS — Z7901 Long term (current) use of anticoagulants: Secondary | ICD-10-CM | POA: Insufficient documentation

## 2018-02-25 DIAGNOSIS — I5042 Chronic combined systolic (congestive) and diastolic (congestive) heart failure: Secondary | ICD-10-CM | POA: Insufficient documentation

## 2018-02-25 DIAGNOSIS — Z885 Allergy status to narcotic agent status: Secondary | ICD-10-CM | POA: Insufficient documentation

## 2018-02-25 DIAGNOSIS — I080 Rheumatic disorders of both mitral and aortic valves: Secondary | ICD-10-CM | POA: Insufficient documentation

## 2018-02-25 DIAGNOSIS — Z89429 Acquired absence of other toe(s), unspecified side: Secondary | ICD-10-CM | POA: Diagnosis not present

## 2018-02-25 DIAGNOSIS — I4891 Unspecified atrial fibrillation: Secondary | ICD-10-CM

## 2018-02-25 DIAGNOSIS — K219 Gastro-esophageal reflux disease without esophagitis: Secondary | ICD-10-CM | POA: Diagnosis not present

## 2018-02-25 DIAGNOSIS — E7800 Pure hypercholesterolemia, unspecified: Secondary | ICD-10-CM | POA: Diagnosis not present

## 2018-02-25 DIAGNOSIS — I4819 Other persistent atrial fibrillation: Secondary | ICD-10-CM

## 2018-02-25 DIAGNOSIS — Z79899 Other long term (current) drug therapy: Secondary | ICD-10-CM | POA: Diagnosis not present

## 2018-02-25 DIAGNOSIS — I481 Persistent atrial fibrillation: Secondary | ICD-10-CM | POA: Insufficient documentation

## 2018-02-25 DIAGNOSIS — I493 Ventricular premature depolarization: Secondary | ICD-10-CM | POA: Insufficient documentation

## 2018-02-25 DIAGNOSIS — Z9889 Other specified postprocedural states: Secondary | ICD-10-CM | POA: Insufficient documentation

## 2018-02-25 DIAGNOSIS — E785 Hyperlipidemia, unspecified: Secondary | ICD-10-CM | POA: Diagnosis not present

## 2018-02-25 DIAGNOSIS — Z955 Presence of coronary angioplasty implant and graft: Secondary | ICD-10-CM | POA: Diagnosis not present

## 2018-02-25 DIAGNOSIS — I110 Hypertensive heart disease with heart failure: Secondary | ICD-10-CM | POA: Insufficient documentation

## 2018-02-25 HISTORY — PX: CARDIOVERSION: SHX1299

## 2018-02-25 LAB — POCT INR: INR: 2.6

## 2018-02-25 SURGERY — CARDIOVERSION
Anesthesia: General

## 2018-02-25 MED ORDER — SODIUM CHLORIDE 0.9 % IV SOLN
INTRAVENOUS | Status: DC
  Administered 2018-02-25: 12:00:00 via INTRAVENOUS

## 2018-02-25 MED ORDER — LIDOCAINE 2% (20 MG/ML) 5 ML SYRINGE
INTRAMUSCULAR | Status: DC | PRN
  Administered 2018-02-25: 60 mg via INTRAVENOUS

## 2018-02-25 MED ORDER — PROPOFOL 10 MG/ML IV BOLUS
INTRAVENOUS | Status: DC | PRN
  Administered 2018-02-25: 50 mg via INTRAVENOUS

## 2018-02-25 NOTE — Patient Instructions (Signed)
Description   Continue same dose 1/2 tablet everyday except 1 tablet on Mondays, Wednesdays and Fridays.   Recheck in 1 week. Call us with any medication changes or concerns # [phone] [facility] # [phone]. Scheduled for cardioversion today Recheck INR in 1 week

## 2018-02-25 NOTE — Interval H&P Note (Signed)
History and Physical Interval Note:  02/25/2018 12:22 PM  Molly [name]  has presented today for surgery, with the diagnosis of aflutter  The various methods of treatment have been discussed with the patient and family. After consideration of risks, benefits and other options for treatment, the patient has consented to  Procedure(s): CARDIOVERSION (N/A) as a surgical intervention .  The patient's history has been reviewed, patient examined, no change in status, stable for surgery.  I have reviewed the patient's chart and labs.  Questions were answered to the patient's satisfaction.     Skeet Latch, MD

## 2018-02-25 NOTE — Anesthesia Procedure Notes (Signed)
Procedure Name: General with mask airway Performed by: Valda Favia, CRNA Pre-anesthesia Checklist: Patient identified, Emergency Drugs available, Suction available, Patient being monitored and Timeout performed Patient Re-evaluated:Patient Re-evaluated prior to induction Oxygen Delivery Method: Ambu bag Preoxygenation: Pre-oxygenation with 100% oxygen Induction Type: IV induction Placement Confirmation: positive ETCO2 Dental Injury: Teeth and Oropharynx as per pre-operative assessment

## 2018-02-25 NOTE — Anesthesia Preprocedure Evaluation (Signed)
Anesthesia Evaluation  Patient identified by MRN, date of birth, ID band Patient awake    Reviewed: Allergy & Precautions, NPO status , Patient's Chart, lab work & pertinent test results  Airway Mallampati: II  TM Distance: >3 FB Neck ROM: Full    Dental no notable dental hx.    Pulmonary neg pulmonary ROS,    Pulmonary exam normal breath sounds clear to auscultation       Cardiovascular hypertension, + pacemaker + Valvular Problems/Murmurs MR  Rhythm:Irregular Rate:Normal + Systolic murmurs EF 39-03%   Neuro/Psych negative neurological ROS  negative psych ROS   GI/Hepatic negative GI ROS, Neg liver ROS,   Endo/Other  negative endocrine ROS  Renal/GU negative Renal ROS  negative genitourinary   Musculoskeletal negative musculoskeletal ROS (+)   Abdominal   Peds negative pediatric ROS (+)  Hematology negative hematology ROS (+)   Anesthesia Other Findings   Reproductive/Obstetrics negative OB ROS                             Anesthesia Physical Anesthesia Plan  ASA: III  Anesthesia Plan: General   Post-op Pain Management:    Induction: Intravenous  PONV Risk Score and Plan: 3  Airway Management Planned: Mask  Additional Equipment:   Intra-op Plan:   Post-operative Plan:   Informed Consent: I have reviewed the patients History and Physical, chart, labs and discussed the procedure including the risks, benefits and alternatives for the proposed anesthesia with the patient or authorized representative who has indicated his/her understanding and acceptance.   Dental advisory given  Plan Discussed with: CRNA and Surgeon  Anesthesia Plan Comments:         Anesthesia Quick Evaluation

## 2018-02-25 NOTE — Anesthesia Postprocedure Evaluation (Signed)
Anesthesia Post Note  Patient: Molly [name]  Procedure(s) Performed: CARDIOVERSION (N/A )     Patient location during evaluation: PACU Anesthesia Type: General Level of consciousness: awake and alert Pain management: pain level controlled Vital Signs Assessment: post-procedure vital signs reviewed and stable Respiratory status: spontaneous breathing, nonlabored ventilation, respiratory function stable and patient connected to nasal cannula oxygen Cardiovascular status: blood pressure returned to baseline and stable Postop Assessment: no apparent nausea or vomiting Anesthetic complications: no    Last Vitals:  Vitals:   02/25/18 1115 02/25/18 1231  BP: (!) 135/105 (!) 114/58  Pulse: (!) 138 70  Resp: 16 20  Temp: 36.6 C 36.5 C  SpO2: 97% 93%    Last Pain:  Vitals:   02/25/18 1231  TempSrc: Oral  PainSc:                  [name] S

## 2018-02-25 NOTE — CV Procedure (Signed)
Electrical Cardioversion Procedure Note Molly [name] [id number] 1933/06/06  Procedure: Electrical Cardioversion Indications:  Atrial Fibrillation  Procedure Details Consent: Risks of procedure as well as the alternatives and risks of each were explained to the (patient/caregiver).  Consent for procedure obtained. Time Out: Verified patient identification, verified procedure, site/side was marked, verified correct patient position, special equipment/implants available, medications/allergies/relevent history reviewed, required imaging and test results available.  Performed  Patient placed on cardiac monitor, pulse oximetry, supplemental oxygen as necessary.  Sedation given: propofol Pacer pads placed anterior and posterior chest.  Cardioverted 1 time(s).  Cardioverted at 150J.  Evaluation Findings: Post procedure EKG shows: Atrial paced, ventricular paced Complications: None Patient did tolerate procedure well.   Skeet Latch, MD 02/25/2018, 12:25 PM

## 2018-02-25 NOTE — Discharge Instructions (Signed)
Electrical Cardioversion, Care After °This sheet gives you information about how to care for yourself after your procedure. Your health care provider may also give you more specific instructions. If you have problems or questions, contact your health care provider. °What can I expect after the procedure? °After the procedure, it is common to have: °· Some redness on the skin where the shocks were given. ° °Follow these instructions at home: °· Do not drive for 24 hours if you were given a medicine to help you relax (sedative). °· Take over-the-counter and prescription medicines only as told by your health care provider. °· Ask your health care provider how to check your pulse. Check it often. °· Rest for 48 hours after the procedure or as told by your health care provider. °· Avoid or limit your caffeine use as told by your health care provider. °Contact a health care provider if: °· You feel like your heart is beating too quickly or your pulse is not regular. °· You have a serious muscle cramp that does not go away. °Get help right away if: °· You have discomfort in your chest. °· You are dizzy or you feel faint. °· You have trouble breathing or you are short of breath. °· Your speech is slurred. °· You have trouble moving an arm or leg on one side of your body. °· Your fingers or toes turn cold or blue. °This information is not intended to replace advice given to you by your health care provider. Make sure you discuss any questions you have with your health care provider. °Document Released: 08/02/2013 Document Revised: 05/15/2016 Document Reviewed: 04/17/2016 °Elsevier Interactive Patient Education © 2018 [employer] ° °

## 2018-02-25 NOTE — Transfer of Care (Signed)
Immediate Anesthesia Transfer of Care Note  Patient: Molly [name]  Procedure(s) Performed: CARDIOVERSION (N/A )  Patient Location: Endoscopy Unit  Anesthesia Type:General  Level of Consciousness: awake, alert  and oriented  Airway & Oxygen Therapy: Patient Spontanous Breathing  Post-op Assessment: Report given to RN and Post -op Vital signs reviewed and stable  Post vital signs: Reviewed and stable  Last Vitals:  Vitals Value Taken Time  BP    Temp    Pulse 70 02/25/2018 12:31 PM  Resp 20 02/25/2018 12:31 PM  SpO2 93 % 02/25/2018 12:31 PM  Vitals shown include unvalidated device data.  Last Pain:  Vitals:   02/25/18 1115  TempSrc: Oral  PainSc: 0-No pain         Complications: No apparent anesthesia complications

## 2018-02-27 ENCOUNTER — Encounter (HOSPITAL_COMMUNITY): Payer: Self-pay | Admitting: Cardiovascular Disease

## 2018-03-07 ENCOUNTER — Ambulatory Visit (INDEPENDENT_AMBULATORY_CARE_PROVIDER_SITE_OTHER): Payer: Medicare Other | Admitting: Pharmacist

## 2018-03-07 DIAGNOSIS — Z5181 Encounter for therapeutic drug level monitoring: Secondary | ICD-10-CM

## 2018-03-07 DIAGNOSIS — I4891 Unspecified atrial fibrillation: Secondary | ICD-10-CM | POA: Diagnosis not present

## 2018-03-07 DIAGNOSIS — I481 Persistent atrial fibrillation: Secondary | ICD-10-CM | POA: Diagnosis not present

## 2018-03-07 DIAGNOSIS — I4819 Other persistent atrial fibrillation: Secondary | ICD-10-CM

## 2018-03-07 LAB — POCT INR: INR: 2.4

## 2018-03-07 NOTE — Patient Instructions (Signed)
Description   Continue same dose 1/2 tablet everyday except 1 tablet on Mondays, Wednesdays and Fridays.   Recheck in 4 weeks. Call us with any medication changes or concerns # [phone] [facility] Main # [phone].

## 2018-03-14 ENCOUNTER — Encounter: Payer: Self-pay | Admitting: Internal Medicine

## 2018-03-14 ENCOUNTER — Ambulatory Visit (INDEPENDENT_AMBULATORY_CARE_PROVIDER_SITE_OTHER): Payer: Medicare Other | Admitting: Internal Medicine

## 2018-03-14 VITALS — BP 124/86 | HR 73 | Ht 65.0 in | Wt 184.8 lb

## 2018-03-14 DIAGNOSIS — I5042 Chronic combined systolic (congestive) and diastolic (congestive) heart failure: Secondary | ICD-10-CM

## 2018-03-14 DIAGNOSIS — Z950 Presence of cardiac pacemaker: Secondary | ICD-10-CM

## 2018-03-14 DIAGNOSIS — I495 Sick sinus syndrome: Secondary | ICD-10-CM | POA: Diagnosis not present

## 2018-03-14 DIAGNOSIS — I4891 Unspecified atrial fibrillation: Secondary | ICD-10-CM

## 2018-03-14 DIAGNOSIS — I420 Dilated cardiomyopathy: Secondary | ICD-10-CM | POA: Diagnosis not present

## 2018-03-14 LAB — CUP PACEART INCLINIC DEVICE CHECK
Brady Statistic RA Percent Paced: 94 %
Brady Statistic RV Percent Paced: 10 %
Implantable Lead Implant Date: 20140508
Implantable Lead Implant Date: 20140508
Implantable Lead Location: 753859
Implantable Lead Model: 4135
Implantable Lead Model: 4136
Implantable Lead Serial Number: 29379474
Implantable Pulse Generator Implant Date: 20140508
Lead Channel Impedance Value: 680 Ohm
Lead Channel Impedance Value: 684 Ohm
Lead Channel Pacing Threshold Amplitude: 1 V
Lead Channel Pacing Threshold Pulse Width: 0.4 ms
Lead Channel Sensing Intrinsic Amplitude: 1.8 mV
Lead Channel Setting Pacing Amplitude: 2.4 V
Lead Channel Setting Pacing Pulse Width: 0.4 ms
MDC IDC LEAD LOCATION: 753860
MDC IDC LEAD SERIAL: 29333020
MDC IDC MSMT LEADCHNL RV PACING THRESHOLD AMPLITUDE: 0.7 V
MDC IDC MSMT LEADCHNL RV PACING THRESHOLD PULSEWIDTH: 0.4 ms
MDC IDC MSMT LEADCHNL RV SENSING INTR AMPL: 23.1 mV
MDC IDC SESS DTM: 20190520040000
MDC IDC SET LEADCHNL RA PACING AMPLITUDE: 2 V
MDC IDC SET LEADCHNL RV SENSING SENSITIVITY: 2.5 mV
Pulse Gen Serial Number: 112392

## 2018-03-14 NOTE — Patient Instructions (Addendum)
Medication Instructions:  Your physician recommends that you continue on your current medications as directed. Please refer to the Current Medication list given to you today.  Labwork: None ordered.  Testing/Procedures: None ordered.  Follow-Up: Your physician wants you to follow-up in: 6 months with Dr. [name].   You will receive a reminder letter in the mail two months in advance. If you don't receive a letter, please call our office to schedule the follow-up appointment.  Remote monitoring is used to monitor your Pacemaker from home. This monitoring reduces the number of office visits required to check your device to one time per year. It allows [place] to keep an eye on the functioning of your device to ensure it is working properly. You are scheduled for a device check from home on 03/23/2018. You may send your transmission at any time that day. If you have a wireless device, the transmission will be sent automatically. After your physician reviews your transmission, you will receive a postcard with your next transmission date.  Any Other Special Instructions Will Be Listed Below (If Applicable).  Referring to Dr. Curt Bears.  If you need a refill on your cardiac medications before your next appointment, please call your pharmacy.

## 2018-03-14 NOTE — Progress Notes (Signed)
HPI Molly [name] returns today for ongoing evaluation and management of her atrial fib. She has been on dofetilide and had ERAF and underwent repeat DCCV and has been stable. She feels much better in NSR and poorly in atrial fib. Her rates in fib are not too high. She denies chest pain or sob. She was noted to be mildly anemic. She has sinus node dysfunction but is asymptomatic, s/p PPM.  Allergies  Allergen Reactions  . Codeine Nausea Only  . Tramadol Nausea Only     Current Outpatient Medications  Medication Sig Dispense Refill  . acetaminophen (TYLENOL) 500 MG tablet Take 1,000 mg by mouth every 6 (six) hours as needed. For pain    . atorvastatin (LIPITOR) 10 MG tablet Take 10 mg by mouth every evening.    . carvedilol (COREG) 3.125 MG tablet Take 1 tablet (3.125 mg total) by mouth 2 (two) times daily with a meal. 180 tablet 3  . dofetilide (TIKOSYN) 250 MCG capsule Take 1 capsule (250 mcg total) by mouth 2 (two) times daily. 60 capsule 4  . dorzolamide (TRUSOPT) 2 % ophthalmic solution Place 1 drop into the right eye 2 (two) times daily.    Marland Kitchen LORazepam (ATIVAN) 0.5 MG tablet Take 0.5 tablets by mouth at bedtime as needed.  0  . losartan (COZAAR) 25 MG tablet Take 0.5 tablets (12.5 mg total) by mouth daily. 90 tablet 3  . Magnesium 250 MG TABS Take by mouth 2 (two) times daily.    . Multiple Vitamin (MULTIVITAMIN WITH MINERALS) TABS Take 1 tablet by mouth daily.    Marland Kitchen omeprazole (PRILOSEC) 20 MG capsule Take 20 mg by mouth daily.    Marland Kitchen spironolactone (ALDACTONE) 25 MG tablet Take 12.5 mg by mouth daily.    Marland Kitchen warfarin (COUMADIN) 5 MG tablet Take 2.5-5 mg by mouth See admin instructions. M, W, F 5 mg, all other days is 2.5 mg     No current facility-administered medications for this visit.      Past Medical History:  Diagnosis Date  . Aortic insufficiency    mild by echo 2017  . Cancer ([place])    Skin cancer- basal.  1 mylenoma  . Complication of anesthesia   . DCM (dilated  cardiomyopathy) ([place])    EF 40-45% by echo 2017 - nuclear stress test with no ischemia  . GERD (gastroesophageal reflux disease)   . H/O benign essential tremor    on amiodarone resolved off amio  . H/O hyperkalemia    Resolved off ACE inhibitors  . High cholesterol   . History of blood transfusion   . Hyperparathyroidism ([place])   . Hypertension   . Mitral regurgitation    mild to moderate by echo 2017  . Persistent atrial fibrillation ([place])   . PONV (postoperative nausea and vomiting)   . PVC's (premature ventricular contractions)   . Renal insufficiency    MILD  . Shortness of breath    with exertion  . Tachycardia-bradycardia syndrome ([place])    s/p PPM 02/2013    ROS:   All systems reviewed and negative except as noted in the HPI.   Past Surgical History:  Procedure Laterality Date  . AMPUTATION Right 11/27/2014   Procedure: RIGHT 2ND AND 3RD TOE AMPUTATION;  Surgeon: Wylene Simmer, MD;  Location: [facility];  Service: Orthopedics;  Laterality: Right;  . BUNIONECTOMY     Right  . CARDIAC CATHETERIZATION  09/25/2000   EF of 50% -- with  normal left ventricular size and function  . CARDIOVERSION N/A 12/28/2017   Procedure: CARDIOVERSION;  Surgeon: Dorothy Spark, MD;  Location: [facility] ENDOSCOPY;  Service: Cardiovascular;  Laterality: N/A;  . CARDIOVERSION N/A 02/25/2018   Procedure: CARDIOVERSION;  Surgeon: Skeet Latch, MD;  Location: [facility];  Service: Cardiovascular;  Laterality: N/A;  . CESAREAN SECTION    . CESAREAN SECTION    . EYE SURGERY Bilateral    Cataract  . FOOT SURGERY    . INSERT / REPLACE / REMOVE PACEMAKER  02/2013  . PACEMAKER INSERTION  02/2013  . PERMANENT PACEMAKER INSERTION N/A 03/02/2013   Procedure: PERMANENT PACEMAKER INSERTION;  Surgeon: Evans Lance, MD;  [facility] CATH LAB;  Service: Cardiovascular;  Laterality: N/A;  . SHOULDER ARTHROSCOPY W/ ROTATOR CUFF REPAIR    . SHOULDER SURGERY Right    roto cuff  . TOE AMPUTATION Left    2nd and  3rd, bunion under toes.  . TONSILLECTOMY    . VARICOSE VEIN SURGERY       Family History  Problem Relation Age of Onset  . Hypertension Mother   . Hypertension Father      Social History   Socioeconomic History  . Marital status: Married    Spouse name: Not on file  . Number of children: Not on file  . Years of education: Not on file  . Highest education level: Not on file  Occupational History  . Not on file  Social Needs  . Financial resource strain: Not on file  . Food insecurity:    Worry: Not on file    Inability: Not on file  . Transportation needs:    Medical: Not on file    Non-medical: Not on file  Tobacco Use  . Smoking status: Never Smoker  . Smokeless tobacco: Never Used  Substance and Sexual Activity  . Alcohol use: No  . Drug use: No  . Sexual activity: Not Currently  Lifestyle  . Physical activity:    Days per week: Not on file    Minutes per session: Not on file  . Stress: Not on file  Relationships  . Social connections:    Talks on phone: Not on file    Gets together: Not on file    Attends religious service: Not on file    Active member of club or organization: Not on file    Attends meetings of clubs or organizations: Not on file    Relationship status: Not on file  . Intimate partner violence:    Fear of current or ex partner: Not on file    Emotionally abused: Not on file    Physically abused: Not on file    Forced sexual activity: Not on file  Other Topics Concern  . Not on file  Social History Narrative  . Not on file     BP 124/86   Pulse 73   Ht 5\' 5"  (1.651 m)   Wt 184 lb 12.8 oz (83.8 kg)   SpO2 97%   BMI 30.75 kg/m   Physical Exam:  Well appearing 84 yo woman, looking younger than stated age, NAD HEENT: Unremarkable Neck:  6 cm JVD, no thyromegally Lymphatics:  No adenopathy Back:  No CVA tenderness Lungs:  Clear with no wheezes HEART:  Regular rate rhythm, no murmurs, no rubs, no clicks Abd:  soft, positive  bowel sounds, no organomegally, no rebound, no guarding Ext:  2 plus pulses, no edema, no cyanosis, no clubbing Skin:  No rashes no nodules Neuro:  CN II through XII intact, motor grossly intact  EKG - NSR with atrial pacing  DEVICE  Normal device function.  See PaceArt for details.   Assess/Plan: 1. PAF - she is in NSR today. She will continue dofetilide. That she went back to atrial fib and had to undergo DCCV makes the likelihood that she will need other treatment high. I have asked her to see Dr. Curt Bears to consider PVI. Normally I would not refer an 84 yo but she is very symptomatic and highly functional. Her LA of 58 mm may make PVI prohibitive.  2. PPM - her [employer] PPM is working normally. Will recheck in several months.  3. LV dysfunction - her EF is 45% by echo 3 months ago. She is clinically class 1 in NSR, 2B in atrial fib. 4. MR - by her last echo, the MR was improved.  Mikle Bosworth.D.

## 2018-03-23 ENCOUNTER — Ambulatory Visit (INDEPENDENT_AMBULATORY_CARE_PROVIDER_SITE_OTHER): Payer: Medicare Other | Admitting: *Deleted

## 2018-03-23 DIAGNOSIS — I4819 Other persistent atrial fibrillation: Secondary | ICD-10-CM

## 2018-03-23 DIAGNOSIS — I481 Persistent atrial fibrillation: Secondary | ICD-10-CM

## 2018-03-23 DIAGNOSIS — I495 Sick sinus syndrome: Secondary | ICD-10-CM

## 2018-03-23 NOTE — Progress Notes (Signed)
No transmission  

## 2018-03-24 NOTE — Progress Notes (Signed)
Remote pacemaker transmission.   

## 2018-03-29 LAB — CUP PACEART REMOTE DEVICE CHECK
Brady Statistic RA Percent Paced: 72 %
Date Time Interrogation Session: 20190529064100
Implantable Lead Implant Date: 20140508
Implantable Lead Implant Date: 20140508
Implantable Lead Location: 753859
Implantable Lead Model: 4135
Implantable Lead Model: 4136
Lead Channel Impedance Value: 670 Ohm
Lead Channel Impedance Value: 686 Ohm
Lead Channel Setting Pacing Amplitude: 2 V
Lead Channel Setting Pacing Amplitude: 2.4 V
MDC IDC LEAD LOCATION: 753860
MDC IDC LEAD SERIAL: 29333020
MDC IDC LEAD SERIAL: 29379474
MDC IDC MSMT BATTERY REMAINING LONGEVITY: 84 mo
MDC IDC MSMT BATTERY REMAINING PERCENTAGE: 88 %
MDC IDC MSMT LEADCHNL RA PACING THRESHOLD AMPLITUDE: 1 V
MDC IDC MSMT LEADCHNL RA PACING THRESHOLD PULSEWIDTH: 0.4 ms
MDC IDC PG IMPLANT DT: 20140508
MDC IDC SET LEADCHNL RV PACING PULSEWIDTH: 0.4 ms
MDC IDC SET LEADCHNL RV SENSING SENSITIVITY: 2.5 mV
MDC IDC STAT BRADY RV PERCENT PACED: 5 %
Pulse Gen Serial Number: 112392

## 2018-03-31 ENCOUNTER — Telehealth: Payer: Self-pay | Admitting: Internal Medicine

## 2018-03-31 NOTE — Telephone Encounter (Signed)
Appt made with Dr. Curt Bears for Monday June 10.  Advised Pt to continue current medications until appt.

## 2018-03-31 NOTE — Telephone Encounter (Signed)
Pt called to report that she feels like she is back in Afib. She says her heart rhythm has bothered her all night. She denies shortness of breath, dizziness, chest pain but has felt lightheaded when going from sitting to standing this morning. BP cuff keeps reading error since her hear is irregular. Only BP she got was 80/67 HR52 but pt feels it is inaccurate. She says she never gets a proper reading when her heart is like this. Pt also wondering about appointment with Dr. Curt Bears she was told about at her last visit. Advised pt to relax and I will forward message to Dr. [name] since he is in the office today for further recommendations.

## 2018-03-31 NOTE — Telephone Encounter (Signed)
New Message    Patient c/o Palpitations:  High priority if patient c/o lightheadedness, shortness of breath, or chest pain  1) How long have you had palpitations/irregular HR/ Afib? Are you having the symptoms now? Since last night  2) Are you currently experiencing lightheadedness, SOB or CP? Very  Lightheadedness when she gets up  3) Do you have a history of afib (atrial fibrillation) or irregular heart rhythm? yes  4) Have you checked your BP or HR? (document readings if available): bp 80/67 hr52  5) Are you experiencing any other symptoms? Pt states she can barely walk across the floor

## 2018-04-04 ENCOUNTER — Ambulatory Visit (INDEPENDENT_AMBULATORY_CARE_PROVIDER_SITE_OTHER): Payer: Medicare Other | Admitting: Cardiology

## 2018-04-04 ENCOUNTER — Encounter: Payer: Self-pay | Admitting: Cardiology

## 2018-04-04 ENCOUNTER — Encounter (INDEPENDENT_AMBULATORY_CARE_PROVIDER_SITE_OTHER): Payer: Self-pay

## 2018-04-04 ENCOUNTER — Ambulatory Visit (INDEPENDENT_AMBULATORY_CARE_PROVIDER_SITE_OTHER): Payer: Medicare Other | Admitting: *Deleted

## 2018-04-04 VITALS — BP 156/88 | HR 79 | Ht 65.0 in | Wt 187.8 lb

## 2018-04-04 DIAGNOSIS — I4891 Unspecified atrial fibrillation: Secondary | ICD-10-CM

## 2018-04-04 DIAGNOSIS — I481 Persistent atrial fibrillation: Secondary | ICD-10-CM | POA: Diagnosis not present

## 2018-04-04 DIAGNOSIS — I495 Sick sinus syndrome: Secondary | ICD-10-CM | POA: Diagnosis not present

## 2018-04-04 DIAGNOSIS — I4819 Other persistent atrial fibrillation: Secondary | ICD-10-CM

## 2018-04-04 DIAGNOSIS — I480 Paroxysmal atrial fibrillation: Secondary | ICD-10-CM | POA: Diagnosis not present

## 2018-04-04 DIAGNOSIS — I5022 Chronic systolic (congestive) heart failure: Secondary | ICD-10-CM | POA: Diagnosis not present

## 2018-04-04 DIAGNOSIS — Z5181 Encounter for therapeutic drug level monitoring: Secondary | ICD-10-CM

## 2018-04-04 LAB — POCT INR: INR: 2.4 (ref 2.0–3.0)

## 2018-04-04 NOTE — Patient Instructions (Signed)
Medication Instructions:  Your physician recommends that you continue on your current medications as directed. Please refer to the Current Medication list given to you today.  Labwork: None ordered     *We will only notify you of abnormal results, otherwise continue current treatment plan.  Testing/Procedures: Your physician has recommended that you have an ablation. Catheter ablation is a medical procedure used to treat some cardiac arrhythmias (irregular heartbeats). During catheter ablation, a long, thin, flexible tube is put into a blood vessel in your groin (upper thigh), or neck. This tube is called an ablation catheter. It is then guided to your heart through the blood vessel. Radio frequency waves destroy small areas of heart tissue where abnormal heartbeats may cause an arrhythmia to start.   Please call the office if/when you decide to proceed with an  ablation procedure.     Ask for Trinidad Curet, RN  [phone]  Follow-Up: To be determined once procedure has been scheduled.   * If you need a refill on your cardiac medications before your next appointment, please call your pharmacy.   *Please note that any paperwork needing to be filled out by the provider will need to be addressed at the front desk prior to seeing the provider. Please note that any FMLA, disability or other documents regarding health condition is subject to a $25.00 charge that must be received prior to completion of paperwork in the form of a money order or check.  Thank you for choosing [facility]!!   Trinidad Curet, RN [phone]  Any Other Special Instructions Will Be Listed Below (If Applicable).  Cardiac Ablation Cardiac ablation is a procedure to disable (ablate) a small amount of heart tissue in very specific places. The heart has many electrical connections. Sometimes these connections are abnormal and can cause the heart to beat very fast or irregularly. Ablating some of the problem areas  can improve the heart rhythm or return it to normal. Ablation may be done for people who:  Have Wolff-Parkinson-White syndrome.  Have fast heart rhythms (tachycardia).  Have taken medicines for an abnormal heart rhythm (arrhythmia) that were not effective or caused side effects.  Have a high-risk heartbeat that may be life-threatening.  During the procedure, a small incision is made in the neck or the groin, and a long, thin, flexible tube (catheter) is inserted into the incision and moved to the heart. Small devices (electrodes) on the tip of the catheter will send out electrical currents. A type of X-ray (fluoroscopy) will be used to help guide the catheter and to provide images of the heart. Tell a health care provider about:  Any allergies you have.  All medicines you are taking, including vitamins, herbs, eye drops, creams, and over-the-counter medicines.  Any problems you or family members have had with anesthetic medicines.  Any blood disorders you have.  Any surgeries you have had.  Any medical conditions you have, such as kidney failure.  Whether you are pregnant or may be pregnant. What are the risks? Generally, this is a safe procedure. However, problems may occur, including:  Infection.  Bruising and bleeding at the catheter insertion site.  Bleeding into the chest, especially into the sac that surrounds the heart. This is a serious complication.  Stroke or blood clots.  Damage to other structures or organs.  Allergic reaction to medicines or dyes.  Need for a permanent pacemaker if the normal electrical system is damaged. A pacemaker is a small computer that sends  electrical signals to the heart and helps your heart beat normally.  The procedure not being fully effective. This may not be recognized until months later. Repeat ablation procedures are sometimes required.  What happens before the procedure?  Follow instructions from your health care provider  about eating or drinking restrictions.  Ask your health care provider about: ? Changing or stopping your regular medicines. This is especially important if you are taking diabetes medicines or blood thinners. ? Taking medicines such as aspirin and ibuprofen. These medicines can thin your blood. Do not take these medicines before your procedure if your health care provider instructs you not to.  Plan to have someone take you home from the hospital or clinic.  If you will be going home right after the procedure, plan to have someone with you for 24 hours. What happens during the procedure?  To lower your risk of infection: ? Your health care team will wash or sanitize their hands. ? Your skin will be washed with soap. ? Hair may be removed from the incision area.  An IV tube will be inserted into one of your veins.  You will be given a medicine to help you relax (sedative).  The skin on your neck or groin will be numbed.  An incision will be made in your neck or your groin.  A needle will be inserted through the incision and into a large vein in your neck or groin.  A catheter will be inserted into the needle and moved to your heart.  Dye may be injected through the catheter to help your surgeon see the area of the heart that needs treatment.  Electrical currents will be sent from the catheter to ablate heart tissue in desired areas. There are three types of energy that may be used to ablate heart tissue: ? Heat (radiofrequency energy). ? Laser energy. ? Extreme cold (cryoablation).  When the necessary tissue has been ablated, the catheter will be removed.  Pressure will be held on the catheter insertion area to prevent excessive bleeding.  A bandage (dressing) will be placed over the catheter insertion area. The procedure may vary among health care providers and hospitals. What happens after the procedure?  Your blood pressure, heart rate, breathing rate, and blood oxygen  level will be monitored until the medicines you were given have worn off.  Your catheter insertion area will be monitored for bleeding. You will need to lie still for a few hours to ensure that you do not bleed from the catheter insertion area.  Do not drive for 24 hours or as long as directed by your health care provider. Summary  Cardiac ablation is a procedure to disable (ablate) a small amount of heart tissue in very specific places. Ablating some of the problem areas can improve the heart rhythm or return it to normal.  During the procedure, electrical currents will be sent from the catheter to ablate heart tissue in desired areas. This information is not intended to replace advice given to you by your health care provider. Make sure you discuss any questions you have with your health care provider. Document Released: 02/28/2009 Document Revised: 08/31/2016 Document Reviewed: 08/31/2016 Elsevier Interactive Patient Education  [employer].

## 2018-04-04 NOTE — Progress Notes (Signed)
Electrophysiology Office Note   Date:  04/04/2018   ID:  Molly [name], DOB January 18, 1933, MRN [id number]  PCP:  Antony Contras, MD  Cardiologist:  Radford Pax Primary Electrophysiologist: Gaye Alken, MD    No chief complaint on file.    History of Present Illness: Molly [name] is a 84 y.o. female who is being seen today for the evaluation of atrial fibrillation at the request of Antony Contras, MD. Presenting today for electrophysiology evaluation.  She has a history of dilated cardiomyopathy with an EF of 40 to 45%, hyperlipidemia, hypertension, tachybradycardia syndrome status post pacemaker implant 2014.  She is currently on dofetilide for her atrial fibrillation.  In dofetilide, she has required cardioversion to get back into sinus rhythm.  She has had more atrial fibrillation with main complaints of severe weakness, fatigue.  She is unable to do many of her daily activities when she is in atrial fibrillation.  Today, she denies symptoms of palpitations, chest pain, shortness of breath, orthopnea, PND, lower extremity edema, claudication, dizziness, presyncope, syncope, bleeding, or neurologic sequela. The patient is tolerating medications without difficulties.    Past Medical History:  Diagnosis Date  . Aortic insufficiency    mild by echo 2017  . Cancer ([place])    Skin cancer- basal.  1 mylenoma  . Complication of anesthesia   . DCM (dilated cardiomyopathy) ([place])    EF 40-45% by echo 2017 - nuclear stress test with no ischemia  . GERD (gastroesophageal reflux disease)   . H/O benign essential tremor    on amiodarone resolved off amio  . H/O hyperkalemia    Resolved off ACE inhibitors  . High cholesterol   . History of blood transfusion   . Hyperparathyroidism ([place])   . Hypertension   . Mitral regurgitation    mild to moderate by echo 2017  . Persistent atrial fibrillation ([place])   . PONV (postoperative nausea and vomiting)   . PVC's (premature  ventricular contractions)   . Renal insufficiency    MILD  . Shortness of breath    with exertion  . Tachycardia-bradycardia syndrome [facility])    s/p PPM 02/2013   Past Surgical History:  Procedure Laterality Date  . AMPUTATION Right 11/27/2014   Procedure: RIGHT 2ND AND 3RD TOE AMPUTATION;  Surgeon: Wylene Simmer, MD;  Location: [facility];  Service: Orthopedics;  Laterality: Right;  . BUNIONECTOMY     Right  . CARDIAC CATHETERIZATION  09/25/2000   EF of 50% -- with normal left ventricular size and function  . CARDIOVERSION N/A 12/28/2017   Procedure: CARDIOVERSION;  Surgeon: Dorothy Spark, MD;  Location: [facility] ENDOSCOPY;  Service: Cardiovascular;  Laterality: N/A;  . CARDIOVERSION N/A 02/25/2018   Procedure: CARDIOVERSION;  Surgeon: Skeet Latch, MD;  Location: [facility];  Service: Cardiovascular;  Laterality: N/A;  . CESAREAN SECTION    . CESAREAN SECTION    . EYE SURGERY Bilateral    Cataract  . FOOT SURGERY    . INSERT / REPLACE / REMOVE PACEMAKER  02/2013  . PACEMAKER INSERTION  02/2013  . PERMANENT PACEMAKER INSERTION N/A 03/02/2013   Procedure: PERMANENT PACEMAKER INSERTION;  Surgeon: Evans Lance, MD;  Location: [facility] CATH LAB;  Service: Cardiovascular;  Laterality: N/A;  . SHOULDER ARTHROSCOPY W/ ROTATOR CUFF REPAIR    . SHOULDER SURGERY Right    roto cuff  . TOE AMPUTATION Left    2nd and 3rd, bunion under toes.  . TONSILLECTOMY    . VARICOSE  VEIN SURGERY       Current Outpatient Medications  Medication Sig Dispense Refill  . acetaminophen (TYLENOL) 500 MG tablet Take 1,000 mg by mouth every 6 (six) hours as needed. For pain    . atorvastatin (LIPITOR) 10 MG tablet Take 10 mg by mouth every evening.    . carvedilol (COREG) 3.125 MG tablet Take 1 tablet (3.125 mg total) by mouth 2 (two) times daily with a meal. 180 tablet 3  . dofetilide (TIKOSYN) 250 MCG capsule Take 1 capsule (250 mcg total) by mouth 2 (two) times daily. 60 capsule 4  . dorzolamide (TRUSOPT) 2 %  ophthalmic solution Place 1 drop into the right eye 2 (two) times daily.    Marland Kitchen LORazepam (ATIVAN) 0.5 MG tablet Take 0.5 tablets by mouth at bedtime as needed.  0  . losartan (COZAAR) 25 MG tablet Take 0.5 tablets (12.5 mg total) by mouth daily. 90 tablet 3  . Magnesium 250 MG TABS Take by mouth 2 (two) times daily.    . Multiple Vitamin (MULTIVITAMIN WITH MINERALS) TABS Take 1 tablet by mouth daily.    Marland Kitchen omeprazole (PRILOSEC) 20 MG capsule Take 20 mg by mouth daily.    Marland Kitchen spironolactone (ALDACTONE) 25 MG tablet Take 12.5 mg by mouth daily.    Marland Kitchen warfarin (COUMADIN) 5 MG tablet Take 2.5-5 mg by mouth See admin instructions. M, W, F 5 mg, all other days is 2.5 mg     No current facility-administered medications for this visit.     Allergies:   Codeine and Tramadol   Social History:  The patient  reports that she has never smoked. She has never used smokeless tobacco. She reports that she does not drink alcohol or use drugs.   Family History:  The patient's family history includes Hypertension in her father and mother.    ROS:  Please see the history of present illness.   Otherwise, review of systems is positive for leg pain, palpitations, dyspnea on exertion, back pain, easy bruising.   All other systems are reviewed and negative.    PHYSICAL EXAM: VS:  BP (!) 156/88   Pulse 79   Ht 5\' 5"  (1.651 m)   Wt 187 lb 12.8 oz (85.2 kg)   BMI 31.25 kg/m  , BMI Body mass index is 31.25 kg/m. GEN: Well nourished, well developed, in no acute distress  HEENT: normal  Neck: no JVD, carotid bruits, or masses Cardiac: RRR; no murmurs, rubs, or gallops,no edema  Respiratory:  clear to auscultation bilaterally, normal work of breathing GI: soft, nontender, nondistended, + BS MS: no deformity or atrophy  Skin: warm and dry, device pocket is well healed Neuro:  Strength and sensation are intact Psych: euthymic mood, full affect  EKG:  EKG is not ordered today. Personal review of the ekg ordered  03/14/18 shows atrial paced, first-degree AV block, IVCD  Device interrogation is reviewed today in detail.  See PaceArt for details.   Recent Labs: 02/24/2018: BUN 28; Creatinine, Ser 1.25; Hemoglobin 11.2; Magnesium 1.8; Platelets 216; Potassium 5.1; Sodium 139    Lipid Panel     Component Value Date/Time   CHOL 156 05/23/2012 0514   TRIG 86 05/23/2012 0514   HDL 80 05/23/2012 0514   CHOLHDL 2.0 05/23/2012 0514   VLDL 17 05/23/2012 0514   LDLCALC 59 05/23/2012 0514     Wt Readings from Last 3 Encounters:  04/04/18 187 lb 12.8 oz (85.2 kg)  03/14/18 184 lb 12.8 oz (83.8  kg)  02/17/18 190 lb (86.2 kg)      Other studies Reviewed: Additional studies/ records that were reviewed today include: TTE 12/08/17  Review of the above records today demonstrates:  - Left ventricle: The cavity size was normal. There was mild focal   basal hypertrophy of the septum. Systolic function was mildly to   moderately reduced. The estimated ejection fraction was in the   range of 40% to 45%. Mild diffuse hypokinesis. Features are   consistent with a pseudonormal left ventricular filling pattern,   with concomitant abnormal relaxation and increased filling   pressure (grade 2 diastolic dysfunction). - Aortic valve: Trileaflet; mildly thickened, mildly calcified   leaflets. - Mitral valve: Calcified annulus. There was mild to moderate   regurgitation. - Left atrium: The atrium was severely dilated.    ASSESSMENT AND PLAN:  1.  Paroxysmal atrial fibrillation: Currently on dofetilide.  Unfortunately, her left atrium is severely dilated.  I quoted her a roughly 70% chance of remaining in sinus rhythm with ablation.  I do feel like she would need both antiarrhythmics and ablation to remain in rhythm.  Due to her severely dilated atrium and prolonged course with atrial fibrillation, she may have nonpulmonary vein triggers.  We have given her information on ablation which she Rielyn Krupinski take home and  consider her options.  This patients CHA2DS2-VASc Score and unadjusted Ischemic Stroke Rate (% per year) is equal to 4.8 % stroke rate/year from a score of 4  Above score calculated as 1 point each if present [CHF, HTN, DM, Vascular=MI/PAD/Aortic Plaque, Age if 65-74, or Female] Above score calculated as 2 points each if present [Age > 75, or Stroke/TIA/TE]  2.  Sinus node dysfunction: Status post [employer] pacemaker implanted 2014.  Device functioning appropriately.  It appears that she has had some atrial tachycardia on her device.  No changes.  3.  Chronic systolic heart failure: Continue carvedilol, losartan, Aldactone.  Current medicines are reviewed at length with the patient today.   The patient does not have concerns regarding her medicines.  The following changes were made today:  none  Labs/ tests ordered today include:  No orders of the defined types were placed in this encounter.  Case discussed with primary cardiology  Disposition:   FU with Romaine Maciolek pending ablation decision  Signed, Ahmari Garton Meredith Leeds, MD  04/04/2018 4:19 PM     [address] [place] [address] [phone] (office) [phone] (fax)

## 2018-04-04 NOTE — Patient Instructions (Signed)
Description   Continue same dose 1/2 tablet everyday except 1 tablet on Mondays, Wednesdays and Fridays.   Recheck in 4 weeks. Call us with any medication changes or concerns # [phone] [facility] # [phone].

## 2018-04-05 LAB — CUP PACEART INCLINIC DEVICE CHECK
Date Time Interrogation Session: 20190611102301
Implantable Lead Implant Date: 20140508
Implantable Lead Location: 753859
Implantable Lead Model: 4135
Implantable Lead Serial Number: 29333020
Implantable Pulse Generator Implant Date: 20140508
Lead Channel Pacing Threshold Amplitude: 0.7 V
Lead Channel Pacing Threshold Pulse Width: 0.4 ms
Lead Channel Pacing Threshold Pulse Width: 0.4 ms
MDC IDC LEAD IMPLANT DT: 20140508
MDC IDC LEAD LOCATION: 753860
MDC IDC LEAD SERIAL: 29379474
MDC IDC MSMT LEADCHNL RA PACING THRESHOLD AMPLITUDE: 0.9 V
MDC IDC MSMT LEADCHNL RA SENSING INTR AMPL: 2.3 mV
MDC IDC MSMT LEADCHNL RV SENSING INTR AMPL: 25 mV
MDC IDC PG SERIAL: 112392
MDC IDC STAT BRADY RA PERCENT PACED: 78 %
MDC IDC STAT BRADY RV PERCENT PACED: 6 %

## 2018-05-11 ENCOUNTER — Ambulatory Visit (INDEPENDENT_AMBULATORY_CARE_PROVIDER_SITE_OTHER): Payer: Medicare Other | Admitting: *Deleted

## 2018-05-11 DIAGNOSIS — Z5181 Encounter for therapeutic drug level monitoring: Secondary | ICD-10-CM | POA: Diagnosis not present

## 2018-05-11 DIAGNOSIS — I4819 Other persistent atrial fibrillation: Secondary | ICD-10-CM

## 2018-05-11 DIAGNOSIS — I4891 Unspecified atrial fibrillation: Secondary | ICD-10-CM

## 2018-05-11 DIAGNOSIS — I481 Persistent atrial fibrillation: Secondary | ICD-10-CM | POA: Diagnosis not present

## 2018-05-11 LAB — POCT INR: INR: 2.4 (ref 2.0–3.0)

## 2018-05-11 NOTE — Patient Instructions (Signed)
Description   Continue same dose 1/2 tablet everyday except 1 tablet on Mondays, Wednesdays and Fridays.   Recheck in 6 weeks. Call us with any medication changes or concerns # [phone] [facility] Main # [phone].

## 2018-05-27 ENCOUNTER — Other Ambulatory Visit: Payer: Self-pay | Admitting: Physician Assistant

## 2018-05-27 DIAGNOSIS — I4819 Other persistent atrial fibrillation: Secondary | ICD-10-CM

## 2018-06-10 ENCOUNTER — Other Ambulatory Visit: Payer: Self-pay | Admitting: Cardiology

## 2018-06-20 ENCOUNTER — Ambulatory Visit (INDEPENDENT_AMBULATORY_CARE_PROVIDER_SITE_OTHER): Payer: Medicare Other | Admitting: Cardiology

## 2018-06-20 ENCOUNTER — Encounter: Payer: Self-pay | Admitting: Cardiology

## 2018-06-20 ENCOUNTER — Ambulatory Visit (INDEPENDENT_AMBULATORY_CARE_PROVIDER_SITE_OTHER): Payer: Medicare Other | Admitting: Pharmacist

## 2018-06-20 VITALS — BP 124/66 | HR 88 | Ht 65.0 in | Wt 191.8 lb

## 2018-06-20 DIAGNOSIS — I481 Persistent atrial fibrillation: Secondary | ICD-10-CM

## 2018-06-20 DIAGNOSIS — I4819 Other persistent atrial fibrillation: Secondary | ICD-10-CM

## 2018-06-20 DIAGNOSIS — I495 Sick sinus syndrome: Secondary | ICD-10-CM

## 2018-06-20 DIAGNOSIS — I5042 Chronic combined systolic (congestive) and diastolic (congestive) heart failure: Secondary | ICD-10-CM

## 2018-06-20 DIAGNOSIS — I10 Essential (primary) hypertension: Secondary | ICD-10-CM | POA: Diagnosis not present

## 2018-06-20 DIAGNOSIS — I4891 Unspecified atrial fibrillation: Secondary | ICD-10-CM

## 2018-06-20 DIAGNOSIS — Z5181 Encounter for therapeutic drug level monitoring: Secondary | ICD-10-CM

## 2018-06-20 DIAGNOSIS — I351 Nonrheumatic aortic (valve) insufficiency: Secondary | ICD-10-CM

## 2018-06-20 DIAGNOSIS — I420 Dilated cardiomyopathy: Secondary | ICD-10-CM | POA: Diagnosis not present

## 2018-06-20 LAB — POCT INR: INR: 2.4 (ref 2.0–3.0)

## 2018-06-20 NOTE — Progress Notes (Signed)
Cardiology Office Note:    Date:  06/20/2018   ID:  Molly [name], DOB 05-08-1933, MRN [id number]  PCP:  Antony Contras, MD  Cardiologist:  Fransico Him, MD    Referring MD: Antony Contras, MD   Chief Complaint  Patient presents with  . Atrial Fibrillation  . Congestive Heart Failure  . Cardiomyopathy  . Hypertension    History of Present Illness:    Molly [name] is a 84 y.o. female with a hx of  dilated cardiomyopathy with an EF of 40 to 45%, chronic combined systolic/diastolic CHF, hyperlipidemia, hypertension, tachybradycardia syndrome status post pacemaker implant 2014.  She is currently on dofetilide for her atrial fibrillation.  In dofetilide, she has required cardioversion to get back into sinus rhythm.  She has had more atrial fibrillation with main complaints of severe weakness, fatigue.  She is unable to do many of her daily activities when she is in atrial fibrillation.  She was recently seen by Dr. Curt Bears in June we discussed possible A. fib ablation but "only 70% chance of remaining sinus rhythm after the ablation due to her severely dilated LA.  She is here today for followup and is doing well.  She denies any chest pain or pressure, SOB, DOE, PND, orthopnea, LE edema, dizziness, or syncope.  She has been having some pretty bad leg cramps at night and has not had a magnesium level checked recently.  She still had problems with palpitations and does feel really bad when she gets her A. fib but fortunately it usually lasts less than a day at a time.  She says her blood pressure will be low and she will feel very fatigued while is going on.  She also occasionally in bed will notice some flip-flops in her heartbeat which are likely her PVCs.  She is compliant with her meds and is tolerating meds with no SE.    Past Medical History:  Diagnosis Date  . Aortic insufficiency    mild by echo 2017  . Cancer ([place])    Skin cancer- basal.  1 mylenoma  . Complication of  anesthesia   . DCM (dilated cardiomyopathy) ([place])    EF 40-45% by echo 2017 - nuclear stress test with no ischemia  . GERD (gastroesophageal reflux disease)   . H/O benign essential tremor    on amiodarone resolved off amio  . H/O hyperkalemia    Resolved off ACE inhibitors  . High cholesterol   . History of blood transfusion   . Hyperparathyroidism ([place])   . Hypertension   . Mitral regurgitation    mild to moderate by echo 2017  . Persistent atrial fibrillation ([place])   . PONV (postoperative nausea and vomiting)   . PVC's (premature ventricular contractions)   . Renal insufficiency    MILD  . Shortness of breath    with exertion  . Tachycardia-bradycardia syndrome [facility])    s/p PPM 02/2013    Past Surgical History:  Procedure Laterality Date  . AMPUTATION Right 11/27/2014   Procedure: RIGHT 2ND AND 3RD TOE AMPUTATION;  Surgeon: Wylene Simmer, MD;  Location: [facility];  Service: Orthopedics;  Laterality: Right;  . BUNIONECTOMY     Right  . CARDIAC CATHETERIZATION  09/25/2000   EF of 50% -- with normal left ventricular size and function  . CARDIOVERSION N/A 12/28/2017   Procedure: CARDIOVERSION;  Surgeon: Dorothy Spark, MD;  Location: [facility] ENDOSCOPY;  Service: Cardiovascular;  Laterality: N/A;  . CARDIOVERSION N/A 02/25/2018  Procedure: CARDIOVERSION;  Surgeon: Skeet Latch, MD;  Location: [facility];  Service: Cardiovascular;  Laterality: N/A;  . CESAREAN SECTION    . CESAREAN SECTION    . EYE SURGERY Bilateral    Cataract  . FOOT SURGERY    . INSERT / REPLACE / REMOVE PACEMAKER  02/2013  . PACEMAKER INSERTION  02/2013  . PERMANENT PACEMAKER INSERTION N/A 03/02/2013   Procedure: PERMANENT PACEMAKER INSERTION;  Surgeon: Evans Lance, MD;  Location: [facility] CATH LAB;  Service: Cardiovascular;  Laterality: N/A;  . SHOULDER ARTHROSCOPY W/ ROTATOR CUFF REPAIR    . SHOULDER SURGERY Right    roto cuff  . TOE AMPUTATION Left    2nd and 3rd, bunion under toes.  . TONSILLECTOMY    .  VARICOSE VEIN SURGERY      Current Medications: Current Meds  Medication Sig  . acetaminophen (TYLENOL) 500 MG tablet Take 1,000 mg by mouth every 6 (six) hours as needed. For pain  . atorvastatin (LIPITOR) 10 MG tablet Take 10 mg by mouth every evening.  . carvedilol (COREG) 3.125 MG tablet TAKE 1 TABLET TWICE A DAY WITH A MEAL  . dofetilide (TIKOSYN) 250 MCG capsule Take 1 capsule (250 mcg total) by mouth 2 (two) times daily.  . dorzolamide (TRUSOPT) 2 % ophthalmic solution Place 1 drop into the right eye 2 (two) times daily.  Marland Kitchen LORazepam (ATIVAN) 0.5 MG tablet Take 0.5 tablets by mouth at bedtime as needed.  Marland Kitchen losartan (COZAAR) 25 MG tablet Take 0.5 tablets (12.5 mg total) by mouth daily.  . Magnesium 250 MG TABS Take by mouth 2 (two) times daily.  . Multiple Vitamin (MULTIVITAMIN WITH MINERALS) TABS Take 1 tablet by mouth daily.  Marland Kitchen omeprazole (PRILOSEC) 20 MG capsule Take 20 mg by mouth daily.  Marland Kitchen spironolactone (ALDACTONE) 25 MG tablet Take 12.5 mg by mouth daily.  Marland Kitchen warfarin (COUMADIN) 5 MG tablet TAKE 1/2 TO 1 TABLET DAILY AS DIRECTED BY ANTICOAGULATION CLINIC     Allergies:   Codeine and Tramadol   Social History   Socioeconomic History  . Marital status: Married    Spouse name: Not on file  . Number of children: Not on file  . Years of education: Not on file  . Highest education level: Not on file  Occupational History  . Not on file  Social Needs  . Financial resource strain: Not on file  . Food insecurity:    Worry: Not on file    Inability: Not on file  . Transportation needs:    Medical: Not on file    Non-medical: Not on file  Tobacco Use  . Smoking status: Never Smoker  . Smokeless tobacco: Never Used  Substance and Sexual Activity  . Alcohol use: No  . Drug use: No  . Sexual activity: Not Currently  Lifestyle  . Physical activity:    Days per week: Not on file    Minutes per session: Not on file  . Stress: Not on file  Relationships  . Social  connections:    Talks on phone: Not on file    Gets together: Not on file    Attends religious service: Not on file    Active member of club or organization: Not on file    Attends meetings of clubs or organizations: Not on file    Relationship status: Not on file  Other Topics Concern  . Not on file  Social History Narrative  . Not on file  Family History: The patient's family history includes Hypertension in her father and mother.  ROS:   Please see the history of present illness.    ROS  All other systems reviewed and negative.   EKGs/Labs/Other Studies Reviewed:    The following studies were reviewed today: none  EKG:  EKG is not ordered today.    Recent Labs: 02/24/2018: BUN 28; Creatinine, Ser 1.25; Hemoglobin 11.2; Magnesium 1.8; Platelets 216; Potassium 5.1; Sodium 139   Recent Lipid Panel    Component Value Date/Time   CHOL 156 05/23/2012 0514   TRIG 86 05/23/2012 0514   HDL 80 05/23/2012 0514   CHOLHDL 2.0 05/23/2012 0514   VLDL 17 05/23/2012 0514   LDLCALC 59 05/23/2012 0514    Physical Exam:    VS:  BP 124/66   Pulse 88   Ht '5\' 5"'$  (1.651 m)   Wt 191 lb 12 oz (87 kg)   BMI 31.91 kg/m     Wt Readings from Last 3 Encounters:  06/20/18 191 lb 12 oz (87 kg)  04/04/18 187 lb 12.8 oz (85.2 kg)  03/14/18 184 lb 12.8 oz (83.8 kg)     GEN:  Well nourished, well developed in no acute distress HEENT: Normal NECK: No JVD; No carotid bruits LYMPHATICS: No lymphadenopathy CARDIAC: RRR, no murmurs, rubs, gallops RESPIRATORY:  Clear to auscultation without rales, wheezing or rhonchi  ABDOMEN: Soft, non-tender, non-distended MUSCULOSKELETAL:  No edema; No deformity  SKIN: Warm and dry NEUROLOGIC:  Alert and oriented x 3 PSYCHIATRIC:  Normal affect   ASSESSMENT:    1. Nonrheumatic aortic valve insufficiency   2. DCM (dilated cardiomyopathy) ([place])   3. Chronic combined systolic and diastolic heart failure ([place])   4. Essential hypertension   5.  Persistent atrial fibrillation ([place])   6. Tachycardia-bradycardia syndrome ([place])    PLAN:    In order of problems listed above: 1.  Mitral regurgitation -mild to moderate by echo 11/2017.  2.  Dilated cardiomyopathy - 40 to 45% with mild diffuse hypokinesis by echo 11/2017.  3.  Chronic combined systolic/diastolic CHF -she appears euvolemic on exam today.  Her weight is up 4 pounds but likely not fluid.  She will continue on carvedilol 3.125 mg twice daily, losartan 25 mg 1/2 tablet daily and spironolactone 12.5 mg daily.  Creatinine was 1.25 and potassium 5.1 on 02/24/2018.  4.  Hypertension -BP is well controlled on exam today.  She will continue on carvedilol 3.125 mg twice daily, spironolactone 12.5 mg daily, losartan 12.5 mg daily.  4.  Persistent atrial fibrillation -recently seen by EP who said a 70% chance of remaining in sinus rhythm post A. fib ablation due to severe dilated LA.  She is on warfarin for anticoagulation.  She will continue on carvedilol 3.125 mg twice daily and Tikosyn 250 mcg twice daily.  She has not had any bleeding problems.  Hemoglobin was stable at 11.2 on 02/24/2018.  I am going to repeat check a be met and a magnesium level today since she is been having leg cramps to make sure that these are repleted given that she is on Tikosyn.  5.  Tachybrady syndrome status post permanent pacemaker placement followed in our device clinic.  Medication Adjustments/Labs and Tests Ordered: Current medicines are reviewed at length with the patient today.  Concerns regarding medicines are outlined above.  No orders of the defined types were placed in this encounter.  No orders of the defined types were placed in this encounter.  Signed, Fransico Him, MD  06/20/2018 2:58 PM    [facility]

## 2018-06-20 NOTE — Patient Instructions (Addendum)
Your physician recommends that you continue on your current medications as directed. Please refer to the Current Medication list given to you today. Your physician recommends that you return for lab work in:  [place] wants you to follow-up in: [place] will receive a reminder letter in the mail two months in advance. If you don't receive a letter, please call our office to schedule the follow-up appointment.

## 2018-06-20 NOTE — Patient Instructions (Signed)
Description   Continue same dose 1/2 tablet everyday except 1 tablet on Mondays, Wednesdays and Fridays.   Recheck in 6 weeks. Call us with any medication changes or concerns # [phone] [facility], Main # [phone].

## 2018-06-21 ENCOUNTER — Telehealth: Payer: Self-pay

## 2018-06-21 DIAGNOSIS — I4891 Unspecified atrial fibrillation: Secondary | ICD-10-CM

## 2018-06-21 LAB — BASIC METABOLIC PANEL
BUN/Creatinine Ratio: 26 (ref 12–28)
BUN: 29 mg/dL — AB (ref 8–27)
CALCIUM: 10.6 mg/dL — AB (ref 8.7–10.3)
CHLORIDE: 109 mmol/L — AB (ref 96–106)
CO2: 21 mmol/L (ref 20–29)
Creatinine, Ser: 1.11 mg/dL — ABNORMAL HIGH (ref 0.57–1.00)
GFR calc Af Amer: 53 mL/min/{1.73_m2} — ABNORMAL LOW (ref >59)
GFR calc non Af Amer: 46 mL/min/{1.73_m2} — ABNORMAL LOW (ref >59)
GLUCOSE: 88 mg/dL (ref 65–99)
POTASSIUM: 5.2 mmol/L (ref 3.5–5.2)
Sodium: 144 mmol/L (ref 134–144)

## 2018-06-21 LAB — MAGNESIUM: MAGNESIUM: 1.9 mg/dL (ref 1.6–2.3)

## 2018-06-21 MED ORDER — MAGNESIUM 250 MG PO TABS: 500.0000 mg | ORAL_TABLET | Freq: Two times a day (BID) | ORAL | Status: DC

## 2018-06-21 NOTE — Telephone Encounter (Signed)
-----   Message from Sueanne Margarita, MD sent at 06/21/2018  9:50 AM EDT ----- Magnesium needs to be above 2 due to her Tikosyn.  Please increase her magnesium oxide 500 mg twice daily and repeat a bmet and magnesium in 1 week.

## 2018-06-21 NOTE — Telephone Encounter (Signed)
Informed patient of results and verbal understanding expressed.  Instructed patient to INCREASE MAGNESIUM to 500 mg BID (this is an OTC supplement).  BMET, magnesium scheduled next Tuesday. She was grateful for call and agrees with treatment plan.

## 2018-06-23 ENCOUNTER — Ambulatory Visit (INDEPENDENT_AMBULATORY_CARE_PROVIDER_SITE_OTHER): Payer: Medicare Other | Admitting: *Deleted

## 2018-06-23 DIAGNOSIS — I495 Sick sinus syndrome: Secondary | ICD-10-CM | POA: Diagnosis not present

## 2018-06-23 NOTE — Progress Notes (Signed)
Remote pacemaker transmission.   

## 2018-06-28 ENCOUNTER — Other Ambulatory Visit: Payer: Medicare Other | Admitting: *Deleted

## 2018-06-28 DIAGNOSIS — I4891 Unspecified atrial fibrillation: Secondary | ICD-10-CM

## 2018-06-29 ENCOUNTER — Telehealth: Payer: Self-pay | Admitting: *Deleted

## 2018-06-29 DIAGNOSIS — E875 Hyperkalemia: Secondary | ICD-10-CM

## 2018-06-29 LAB — BASIC METABOLIC PANEL
BUN/Creatinine Ratio: 25 (ref 12–28)
BUN: 32 mg/dL — AB (ref 8–27)
CHLORIDE: 106 mmol/L (ref 96–106)
CO2: 21 mmol/L (ref 20–29)
Calcium: 10.7 mg/dL — ABNORMAL HIGH (ref 8.7–10.3)
Creatinine, Ser: 1.26 mg/dL — ABNORMAL HIGH (ref 0.57–1.00)
GFR, EST AFRICAN AMERICAN: 46 mL/min/{1.73_m2} — AB (ref >59)
GFR, EST NON AFRICAN AMERICAN: 39 mL/min/{1.73_m2} — AB (ref >59)
Glucose: 95 mg/dL (ref 65–99)
Potassium: 5.4 mmol/L — ABNORMAL HIGH (ref 3.5–5.2)
Sodium: 141 mmol/L (ref 134–144)

## 2018-06-29 LAB — MAGNESIUM: Magnesium: 2 mg/dL (ref 1.6–2.3)

## 2018-06-29 NOTE — Telephone Encounter (Signed)
Notified the pt that per Dr Radford Pax, her K level is too high, and she recommends that she stop taking spironolactone and come back in on next Monday 07/04/18, to recheck a BMET. Scheduled the pt for lab for 9/9.  Pt verbalized understanding and agrees with this plan.

## 2018-06-29 NOTE — Telephone Encounter (Signed)
-----   Message from Sueanne Margarita, MD sent at 06/29/2018  2:23 PM EDT ----- Potassium is too high - stop spironolactone and repeat BMET on Monday

## 2018-07-04 ENCOUNTER — Other Ambulatory Visit: Payer: Medicare Other | Admitting: *Deleted

## 2018-07-04 DIAGNOSIS — E875 Hyperkalemia: Secondary | ICD-10-CM | POA: Diagnosis not present

## 2018-07-05 LAB — BASIC METABOLIC PANEL
BUN/Creatinine Ratio: 23 (ref 12–28)
BUN: 30 mg/dL — AB (ref 8–27)
CALCIUM: 10.3 mg/dL (ref 8.7–10.3)
CO2: 19 mmol/L — ABNORMAL LOW (ref 20–29)
CREATININE: 1.31 mg/dL — AB (ref 0.57–1.00)
Chloride: 106 mmol/L (ref 96–106)
GFR calc Af Amer: 43 mL/min/{1.73_m2} — ABNORMAL LOW (ref >59)
GFR calc non Af Amer: 38 mL/min/{1.73_m2} — ABNORMAL LOW (ref >59)
Glucose: 112 mg/dL — ABNORMAL HIGH (ref 65–99)
Potassium: 5.3 mmol/L — ABNORMAL HIGH (ref 3.5–5.2)
Sodium: 139 mmol/L (ref 134–144)

## 2018-07-05 NOTE — Progress Notes (Signed)
Pt will call her PMD to talk about her fatigue per Dr. Radford Pax.

## 2018-07-07 ENCOUNTER — Telehealth: Payer: Self-pay | Admitting: *Deleted

## 2018-07-07 DIAGNOSIS — E875 Hyperkalemia: Secondary | ICD-10-CM

## 2018-07-07 NOTE — Telephone Encounter (Signed)
Result Notes for Basic Metabolic Panel (BMET)   Notes recorded by Sueanne Margarita, MD on 07/05/2018 at 1:52 PM EDT The fatigue is a long standing complaint with extensive workup done in the past. ------  Notes recorded by Michaelyn Barter, RN on 07/05/2018 at 1:10 PM EDT Called patient with lab results. Per Dr. Radford Pax, potassium mildly elevated - please have her limit potassium rich foods in her diet. Repeat BMET in 1 week - if potassium still elevated in a week after limiting K in diet will need to consider stopping ARB. Patient will cut back on drinking orange juice. Patient will come in on 9/17 to get lab work done. Patient complaining of not having any energy, low heart rate at times, and feeling fatigue. Will send message to Dr. Radford Pax, so she is aware. Encouraged patient to call her PCP as well about her fatigue. ------  Notes recorded by Sueanne Margarita, MD on 07/05/2018 at 12:40 PM EDT Correction - if potassium still elevated in a week after limiting K in diet will need to consider stopping ARB ------  Notes recorded by Sueanne Margarita, MD on 07/05/2018 at 12:39 PM EDT Labs stable - potassium mildly elevated - please have her limit potassium rich foods in her diet. Repeat BMET in 1 week - if K still elevated at that time may need to stop spironolactone

## 2018-07-12 ENCOUNTER — Other Ambulatory Visit: Payer: Medicare Other

## 2018-07-12 ENCOUNTER — Other Ambulatory Visit: Payer: Self-pay | Admitting: Internal Medicine

## 2018-07-20 LAB — CUP PACEART REMOTE DEVICE CHECK
Brady Statistic RA Percent Paced: 89 %
Brady Statistic RV Percent Paced: 10 %
Date Time Interrogation Session: 20190828064200
Implantable Lead Location: 753859
Implantable Lead Model: 4136
Implantable Lead Serial Number: 29333020
Implantable Lead Serial Number: 29379474
Lead Channel Impedance Value: 605 Ohm
Lead Channel Pacing Threshold Amplitude: 0.9 V
Lead Channel Setting Pacing Amplitude: 2 V
Lead Channel Setting Pacing Amplitude: 2.4 V
Lead Channel Setting Pacing Pulse Width: 0.4 ms
MDC IDC LEAD IMPLANT DT: 20140508
MDC IDC LEAD IMPLANT DT: 20140508
MDC IDC LEAD LOCATION: 753860
MDC IDC MSMT BATTERY REMAINING LONGEVITY: 96 mo
MDC IDC MSMT BATTERY REMAINING PERCENTAGE: 100 %
MDC IDC MSMT LEADCHNL RA PACING THRESHOLD PULSEWIDTH: 0.4 ms
MDC IDC MSMT LEADCHNL RV IMPEDANCE VALUE: 658 Ohm
MDC IDC PG IMPLANT DT: 20140508
MDC IDC SET LEADCHNL RV SENSING SENSITIVITY: 2.5 mV
Pulse Gen Serial Number: 112392

## 2018-07-25 DIAGNOSIS — K219 Gastro-esophageal reflux disease without esophagitis: Secondary | ICD-10-CM | POA: Diagnosis not present

## 2018-07-25 DIAGNOSIS — N183 Chronic kidney disease, stage 3 (moderate): Secondary | ICD-10-CM | POA: Diagnosis not present

## 2018-07-25 DIAGNOSIS — F3340 Major depressive disorder, recurrent, in remission, unspecified: Secondary | ICD-10-CM | POA: Diagnosis not present

## 2018-07-25 DIAGNOSIS — M545 Low back pain: Secondary | ICD-10-CM | POA: Diagnosis not present

## 2018-07-25 DIAGNOSIS — F419 Anxiety disorder, unspecified: Secondary | ICD-10-CM | POA: Diagnosis not present

## 2018-07-25 DIAGNOSIS — I129 Hypertensive chronic kidney disease with stage 1 through stage 4 chronic kidney disease, or unspecified chronic kidney disease: Secondary | ICD-10-CM | POA: Diagnosis not present

## 2018-07-25 DIAGNOSIS — R609 Edema, unspecified: Secondary | ICD-10-CM | POA: Diagnosis not present

## 2018-07-25 DIAGNOSIS — Z23 Encounter for immunization: Secondary | ICD-10-CM | POA: Diagnosis not present

## 2018-07-25 DIAGNOSIS — I4891 Unspecified atrial fibrillation: Secondary | ICD-10-CM | POA: Diagnosis not present

## 2018-07-25 DIAGNOSIS — R790 Abnormal level of blood mineral: Secondary | ICD-10-CM | POA: Diagnosis not present

## 2018-07-25 DIAGNOSIS — E782 Mixed hyperlipidemia: Secondary | ICD-10-CM | POA: Diagnosis not present

## 2018-07-25 DIAGNOSIS — R7303 Prediabetes: Secondary | ICD-10-CM | POA: Diagnosis not present

## 2018-07-26 DIAGNOSIS — H401412 Capsular glaucoma with pseudoexfoliation of lens, right eye, moderate stage: Secondary | ICD-10-CM | POA: Diagnosis not present

## 2018-07-29 ENCOUNTER — Telehealth: Payer: Self-pay

## 2018-07-29 MED ORDER — MAGNESIUM OXIDE -MG SUPPLEMENT 500 MG PO CAPS: 500.0000 mg | ORAL_CAPSULE | Freq: Three times a day (TID) | ORAL | 3 refills | Status: DC

## 2018-07-29 NOTE — Telephone Encounter (Signed)
Spoke with the patient, she expressed understanding about the dose change on magnesium. A Mag level to be drawn on 10/15.     Notes recorded by Sueanne Margarita, MD on 07/28/2018 at 6:47 PM EDT Last mag level still < 2. Please increase magnesium oxide to 500mg  TID and repeat Mag level in 1 week

## 2018-08-06 ENCOUNTER — Other Ambulatory Visit: Payer: Self-pay | Admitting: Cardiology

## 2018-08-09 ENCOUNTER — Ambulatory Visit (INDEPENDENT_AMBULATORY_CARE_PROVIDER_SITE_OTHER): Payer: Medicare Other | Admitting: *Deleted

## 2018-08-09 ENCOUNTER — Other Ambulatory Visit: Payer: Medicare Other

## 2018-08-09 DIAGNOSIS — I4819 Other persistent atrial fibrillation: Secondary | ICD-10-CM | POA: Diagnosis not present

## 2018-08-09 DIAGNOSIS — Z5181 Encounter for therapeutic drug level monitoring: Secondary | ICD-10-CM | POA: Diagnosis not present

## 2018-08-09 LAB — POCT INR: INR: 2.3 (ref 2.0–3.0)

## 2018-08-09 NOTE — Patient Instructions (Signed)
Description   Continue same dose 1/2 tablet everyday except 1 tablet on Mondays, Wednesdays and Fridays.   Recheck in 6 weeks. Call us with any medication changes or concerns # [phone] [facility], Main # [phone].

## 2018-08-10 LAB — MAGNESIUM: Magnesium: 2.1 mg/dL (ref 1.6–2.3)

## 2018-08-17 ENCOUNTER — Telehealth: Payer: Self-pay

## 2018-08-17 MED ORDER — SPIRONOLACTONE 25 MG PO TABS: 12.5000 mg | ORAL_TABLET | Freq: Every day | ORAL | 3 refills | Status: DC

## 2018-08-17 NOTE — Telephone Encounter (Signed)
-----   Message from Sueanne Margarita, MD sent at 08/14/2018  9:54 AM EDT ----- I would like her to see her PCP to try to figure out why she needs such high doses of Magnesium to keep her Mag>2.  Also restart Aldactone 12.5mg  daily and repeat BMET in 5 days to check K+ level.  She needs to avoid added sodium in her diet  Traci ----- Message ----- From: Sarina Ill, RN Sent: 08/10/2018   5:09 PM EDT To: Sueanne Margarita, MD  Spoke with the patient, she is having diarrhea everyday since the magnesium dose increase. She also had ankle swelling since stopping aldactone.

## 2018-08-17 NOTE — Telephone Encounter (Signed)
Spoke with the patient and sent the Aldactone in to her pharmacy and a BMET is scheduled on 10/30.

## 2018-08-19 MED ORDER — MAGNESIUM OXIDE -MG SUPPLEMENT 500 MG PO TABS: ORAL_TABLET | ORAL | 0 refills | Status: DC

## 2018-08-19 NOTE — Telephone Encounter (Signed)
Lets try Mag oxide 500mg  BID and 250mg  at lunch and check mag level in 1 week

## 2018-08-19 NOTE — Addendum Note (Signed)
Addended by: Sarina Ill on: 08/19/2018 10:00 AM   Modules accepted: Orders

## 2018-08-19 NOTE — Telephone Encounter (Signed)
Spoke with the patient and she accepted the medication change and mag level being check on 10/30.

## 2018-08-19 NOTE — Telephone Encounter (Signed)
° °  Patient calling back with additional questions and concerns about diarrhea

## 2018-08-23 ENCOUNTER — Other Ambulatory Visit: Payer: Self-pay | Admitting: Cardiology

## 2018-08-24 ENCOUNTER — Other Ambulatory Visit: Payer: Medicare Other | Admitting: *Deleted

## 2018-08-24 DIAGNOSIS — E875 Hyperkalemia: Secondary | ICD-10-CM | POA: Diagnosis not present

## 2018-08-25 ENCOUNTER — Telehealth: Payer: Self-pay

## 2018-08-25 DIAGNOSIS — R197 Diarrhea, unspecified: Secondary | ICD-10-CM | POA: Diagnosis not present

## 2018-08-25 DIAGNOSIS — I4891 Unspecified atrial fibrillation: Secondary | ICD-10-CM | POA: Diagnosis not present

## 2018-08-25 DIAGNOSIS — R790 Abnormal level of blood mineral: Secondary | ICD-10-CM | POA: Diagnosis not present

## 2018-08-25 DIAGNOSIS — R252 Cramp and spasm: Secondary | ICD-10-CM | POA: Diagnosis not present

## 2018-08-25 LAB — BASIC METABOLIC PANEL
BUN/Creatinine Ratio: 24 (ref 12–28)
BUN: 29 mg/dL — AB (ref 8–27)
CALCIUM: 10.7 mg/dL — AB (ref 8.7–10.3)
CHLORIDE: 107 mmol/L — AB (ref 96–106)
CO2: 23 mmol/L (ref 20–29)
CREATININE: 1.23 mg/dL — AB (ref 0.57–1.00)
GFR calc Af Amer: 47 mL/min/{1.73_m2} — ABNORMAL LOW (ref >59)
GFR calc non Af Amer: 41 mL/min/{1.73_m2} — ABNORMAL LOW (ref >59)
GLUCOSE: 86 mg/dL (ref 65–99)
Potassium: 5.6 mmol/L — ABNORMAL HIGH (ref 3.5–5.2)
Sodium: 140 mmol/L (ref 134–144)

## 2018-08-25 NOTE — Telephone Encounter (Signed)
Please have her stop the magnesium and have her PCP check a Mag level.  I would like her to repeat a mag level on Friday.  If low then we will have to stop Tikosyn and refer back to afib clinic

## 2018-08-25 NOTE — Telephone Encounter (Signed)
Spoke with the patient, she has been having severe diarrhea due to magnesium. She is unable to leave her house due to fear of soiling self. Patient has been taking Imodium and it helps give her time to make it to the toilet. She stated, "I can't live like this", she requested a different antiarrythmic.

## 2018-08-25 NOTE — Telephone Encounter (Signed)
-----   Message from Sueanne Margarita, MD sent at 08/25/2018  7:58 AM EDT ----- K+ elevated.  Please have her come in today for magnesium level

## 2018-08-25 NOTE — Telephone Encounter (Signed)
Spoke with Dr. Laqu[facility] office, they will draw a magnesium level today at her office visit and fax the information to our office, if the value is low she will stop Tikosyn. If not we will order a follow up magnesium on Monday. Patient is aware of afib clinic appointment if magnesium is low.

## 2018-08-29 ENCOUNTER — Other Ambulatory Visit: Payer: Medicare Other

## 2018-08-29 NOTE — Telephone Encounter (Signed)
Spoke with Dr. Laqu[facility] office the patient Magnesium level on Friday was still at 2. The patient is coming in today to confirm current value to see if Tikosyn should be discontinued.

## 2018-08-30 ENCOUNTER — Other Ambulatory Visit: Payer: Medicare Other

## 2018-08-31 ENCOUNTER — Ambulatory Visit (HOSPITAL_COMMUNITY)
Admission: RE | Admit: 2018-08-31 | Discharge: 2018-08-31 | Disposition: A | Discharge status: Home / Self Care | Payer: Medicare Other | Source: Ambulatory Visit | Attending: Nurse Practitioner | Admitting: Nurse Practitioner

## 2018-08-31 ENCOUNTER — Encounter (HOSPITAL_COMMUNITY): Payer: Self-pay | Admitting: Nurse Practitioner

## 2018-08-31 VITALS — BP 140/82 | HR 70 | Ht 65.0 in | Wt 190.2 lb

## 2018-08-31 DIAGNOSIS — I10 Essential (primary) hypertension: Secondary | ICD-10-CM | POA: Insufficient documentation

## 2018-08-31 DIAGNOSIS — Z79899 Other long term (current) drug therapy: Secondary | ICD-10-CM | POA: Diagnosis not present

## 2018-08-31 DIAGNOSIS — Z888 Allergy status to other drugs, medicaments and biological substances status: Secondary | ICD-10-CM | POA: Insufficient documentation

## 2018-08-31 DIAGNOSIS — Z8249 Family history of ischemic heart disease and other diseases of the circulatory system: Secondary | ICD-10-CM | POA: Insufficient documentation

## 2018-08-31 DIAGNOSIS — Z85828 Personal history of other malignant neoplasm of skin: Secondary | ICD-10-CM | POA: Insufficient documentation

## 2018-08-31 DIAGNOSIS — Z89421 Acquired absence of other right toe(s): Secondary | ICD-10-CM | POA: Diagnosis not present

## 2018-08-31 DIAGNOSIS — I420 Dilated cardiomyopathy: Secondary | ICD-10-CM | POA: Diagnosis not present

## 2018-08-31 DIAGNOSIS — I4819 Other persistent atrial fibrillation: Secondary | ICD-10-CM | POA: Insufficient documentation

## 2018-08-31 DIAGNOSIS — Z9889 Other specified postprocedural states: Secondary | ICD-10-CM | POA: Diagnosis not present

## 2018-08-31 DIAGNOSIS — Z7901 Long term (current) use of anticoagulants: Secondary | ICD-10-CM | POA: Diagnosis not present

## 2018-08-31 DIAGNOSIS — Z885 Allergy status to narcotic agent status: Secondary | ICD-10-CM | POA: Insufficient documentation

## 2018-08-31 LAB — MAGNESIUM: MAGNESIUM: 1.8 mg/dL (ref 1.6–2.3)

## 2018-08-31 MED ORDER — MAGNESIUM OXIDE -MG SUPPLEMENT 500 MG PO TABS: 500.0000 mg | ORAL_TABLET | Freq: Every day | ORAL | 0 refills | Status: DC

## 2018-08-31 NOTE — Telephone Encounter (Signed)
Spoke with the patient, she agreed to go to afib clinic. Called afib clinic and she is schedule for 11/6 at 3:30 today to be seen. Patient had no further questions.

## 2018-08-31 NOTE — Patient Instructions (Signed)
Stop prilosec  Magnesium to 1 tablet a day with food

## 2018-08-31 NOTE — Telephone Encounter (Signed)
Notes recorded by Sueanne Margarita, MD on 08/31/2018 at 9:34 AM EST Mag has dropped below 2 but mag supp had to be stopped due to severe diarrhea. Please have her stop Tikosyn since she cannot tolerate the Mag supp. Please get into afib clinic this week to discuss other AAD therapy options

## 2018-09-01 NOTE — Progress Notes (Signed)
Primary Care Physician: Molly Contras, MD Referring Physician:Dr.Turner EP: Molly [name] is a 84 y.o. female with a h/o afib on Tikosyn. It has been difficult for Molly [name] to keep her magnesium at 2 or above for continued Tikosyn use. With higher doses of magnesium, she developed significant diarrhea. Mag supplements were stopped one week ago and her mag yesterday was 1.8. She is in the afib clinic to discuss if she can safely stay on Tikosyn. She is very symptomatic in afib, having failed amiodarone and sotalol She is not the best candidate for ablation, therefore she has no options, she is staying in SR with tikosyn.   Today, she denies symptoms of palpitations, chest pain, shortness of breath, orthopnea, PND, lower extremity edema, dizziness, presyncope, syncope, or neurologic sequela. The patient is tolerating medications without difficulties and is otherwise without complaint today.   Past Medical History:  Diagnosis Date  . Aortic insufficiency    mild by echo 2017  . Cancer ([place])    Skin cancer- basal.  1 mylenoma  . Complication of anesthesia   . DCM (dilated cardiomyopathy) ([place])    EF 40-45% by echo 2017 - nuclear stress test with no ischemia  . GERD (gastroesophageal reflux disease)   . H/O benign essential tremor    on amiodarone resolved off amio  . H/O hyperkalemia    Resolved off ACE inhibitors  . High cholesterol   . History of blood transfusion   . Hyperparathyroidism ([place])   . Hypertension   . Mitral regurgitation    mild to moderate by echo 2017  . Persistent atrial fibrillation   . PONV (postoperative nausea and vomiting)   . PVC's (premature ventricular contractions)   . Renal insufficiency    MILD  . Shortness of breath    with exertion  . Tachycardia-bradycardia syndrome [facility]: [facility] W)    s/p PPM 02/2013   Past Surgical History:  Procedure Laterality Date  . AMPUTATION Right 11/27/2014   Procedure: RIGHT 2ND AND 3RD TOE AMPUTATION;   Surgeon: Wylene Simmer, MD;  Location: [place];  Service: Orthopedics;  Laterality: Right;  . BUNIONECTOMY     Right  . CARDIAC CATHETERIZATION  09/25/2000   EF of 50% -- with normal left ventricular size and function  . CARDIOVERSION N/A 12/28/2017   Procedure: CARDIOVERSION;  Surgeon: Dorothy Spark, MD;  Location: [facility] ENDOSCOPY;  Service: Cardiovascular;  Laterality: N/A;  . CARDIOVERSION N/A 02/25/2018   Procedure: CARDIOVERSION;  Surgeon: Skeet Latch, MD;  Location: [place];  Service: Cardiovascular;  Laterality: N/A;  . CESAREAN SECTION    . CESAREAN SECTION    . EYE SURGERY Bilateral    Cataract  . FOOT SURGERY    . INSERT / REPLACE / REMOVE PACEMAKER  02/2013  . PACEMAKER INSERTION  02/2013  . PERMANENT PACEMAKER INSERTION N/A 03/02/2013   Procedure: PERMANENT PACEMAKER INSERTION;  Surgeon: Evans Lance, MD;  Location: [facility] CATH LAB;  Service: Cardiovascular;  Laterality: N/A;  . SHOULDER ARTHROSCOPY W/ ROTATOR CUFF REPAIR    . SHOULDER SURGERY Right    roto cuff  . TOE AMPUTATION Left    2nd and 3rd, bunion under toes.  . TONSILLECTOMY    . VARICOSE VEIN SURGERY      Current Outpatient Medications  Medication Sig Dispense Refill  . acetaminophen (TYLENOL) 500 MG tablet Take 1,000 mg by mouth every 6 (six) hours as needed. For pain    . atorvastatin (LIPITOR) 10 MG  tablet Take 10 mg by mouth every evening.    . carvedilol (COREG) 3.125 MG tablet TAKE 1 TABLET TWICE A DAY WITH A MEAL 180 tablet 3  . dofetilide (TIKOSYN) 250 MCG capsule TAKE ONE CAPSULE (250MG  TOTAL) BY MOUTH TWO TIMES DAILY 180 capsule 3  . dorzolamide (TRUSOPT) 2 % ophthalmic solution Place 1 drop into the right eye 2 (two) times daily.    Marland Kitchen LORazepam (ATIVAN) 0.5 MG tablet Take 0.5 tablets by mouth at bedtime as needed.  0  . losartan (COZAAR) 25 MG tablet Take 0.5 tablets (12.5 mg total) by mouth daily. 45 tablet 2  . Multiple Vitamin (MULTIVITAMIN WITH MINERALS) TABS Take 1 tablet by mouth daily.      Marland Kitchen spironolactone (ALDACTONE) 25 MG tablet Take 0.5 tablets (12.5 mg total) by mouth daily. 45 tablet 3  . warfarin (COUMADIN) 5 MG tablet TAKE 1/2 TO 1 TABLET DAILY AS DIRECTED BY ANTICOAGULATION CLINIC 90 tablet 1  . Magnesium Oxide 500 MG TABS Take 1 tablet (500 mg total) by mouth daily. Take 500 mg, 1 tablet, twice a day by mouth. At lunch take 250 mg, 1/2 tablet, by mouth  0   No current facility-administered medications for this encounter.     Allergies  Allergen Reactions  . Codeine Nausea Only  . Tramadol Nausea Only    Social History   Socioeconomic History  . Marital status: Married    Spouse name: Not on file  . Number of children: Not on file  . Years of education: Not on file  . Highest education level: Not on file  Occupational History  . Not on file  Social Needs  . Financial resource strain: Not on file  . Food insecurity:    Worry: Not on file    Inability: Not on file  . Transportation needs:    Medical: Not on file    Non-medical: Not on file  Tobacco Use  . Smoking status: Never Smoker  . Smokeless tobacco: Never Used  Substance and Sexual Activity  . Alcohol use: No  . Drug use: No  . Sexual activity: Not Currently  Lifestyle  . Physical activity:    Days per week: Not on file    Minutes per session: Not on file  . Stress: Not on file  Relationships  . Social connections:    Talks on phone: Not on file    Gets together: Not on file    Attends religious service: Not on file    Active member of club or organization: Not on file    Attends meetings of clubs or organizations: Not on file    Relationship status: Not on file  . Intimate partner violence:    Fear of current or ex partner: Not on file    Emotionally abused: Not on file    Physically abused: Not on file    Forced sexual activity: Not on file  Other Topics Concern  . Not on file  Social History Narrative  . Not on file    Family History  Problem Relation Age of Onset  .  Hypertension Mother   . Hypertension Father     ROS- All systems are reviewed and negative except as per the HPI above  Physical Exam: Vitals:   08/31/18 1524  BP: 140/82  Pulse: 70  Weight: 86.3 kg  Height: 5\' 5"  (1.651 m)   Wt Readings from Last 3 Encounters:  08/31/18 86.3 kg  06/20/18 87 kg  04/04/18  85.2 kg    Labs: Lab Results  Component Value Date   NA 140 08/24/2018   K 5.6 (H) 08/24/2018   CL 107 (H) 08/24/2018   CO2 23 08/24/2018   GLUCOSE 86 08/24/2018   BUN 29 (H) 08/24/2018   CREATININE 1.23 (H) 08/24/2018   CALCIUM 10.7 (H) 08/24/2018   PHOS 3.2 02/23/2017   MG 1.8 08/30/2018   Lab Results  Component Value Date   INR 2.3 08/09/2018   Lab Results  Component Value Date   CHOL 156 05/23/2012   HDL 80 05/23/2012   LDLCALC 59 05/23/2012   TRIG 86 05/23/2012     GEN- The patient is well appearing, alert and oriented x 3 today.   Head- normocephalic, atraumatic Eyes-  Sclera clear, conjunctiva pink Ears- hearing intact Oropharynx- clear Neck- supple, no JVP Lymph- no cervical lymphadenopathy Lungs- Clear to ausculation bilaterally, normal work of breathing Heart- Regular rate and rhythm, no murmurs, rubs or gallops, PMI not laterally displaced GI- soft, NT, ND, + BS Extremities- no clubbing, cyanosis, or edema MS- no significant deformity or atrophy Skin- no rash or lesion Psych- euthymic mood, full affect Neuro- strength and sensation are intact  EKG-a paced rhythm at 70 bpm, qtc 455 ms, stable    Assessment and Plan: 1. Symptomatic persistent Afib Failed amio/sotalol in the past, no other drug options  Staying in SR with Tikosyn but magnesium has been difficult to keep at 2 due to high dose mag supplements giving her diarrhea. I discussed with PharmD and if we can keep mag at 1.8 or above, she can safely continue tikosyn I will try her just on one mag tablet a day I will try to stop omeprazole since PPI's can block absorption of  magnesium She has been on PPI's a long time and will see if she can do without it to increase mag absorption  I will see back in one week for a mag level to be drawn and review symptoms  Molly [name], [facility] [address] [place], [place] [address] [phone]

## 2018-09-07 ENCOUNTER — Other Ambulatory Visit (HOSPITAL_COMMUNITY): Payer: Self-pay | Admitting: *Deleted

## 2018-09-07 ENCOUNTER — Encounter (HOSPITAL_COMMUNITY): Payer: Self-pay | Admitting: Nurse Practitioner

## 2018-09-07 ENCOUNTER — Ambulatory Visit (HOSPITAL_COMMUNITY)
Admission: RE | Admit: 2018-09-07 | Discharge: 2018-09-07 | Disposition: A | Discharge status: Home / Self Care | Payer: Medicare Other | Source: Ambulatory Visit | Attending: Nurse Practitioner | Admitting: Nurse Practitioner

## 2018-09-07 VITALS — BP 118/72 | HR 70 | Ht 65.0 in | Wt 189.0 lb

## 2018-09-07 DIAGNOSIS — I4819 Other persistent atrial fibrillation: Secondary | ICD-10-CM | POA: Insufficient documentation

## 2018-09-07 DIAGNOSIS — Z8249 Family history of ischemic heart disease and other diseases of the circulatory system: Secondary | ICD-10-CM | POA: Insufficient documentation

## 2018-09-07 DIAGNOSIS — I4891 Unspecified atrial fibrillation: Secondary | ICD-10-CM | POA: Diagnosis present

## 2018-09-07 DIAGNOSIS — I420 Dilated cardiomyopathy: Secondary | ICD-10-CM | POA: Insufficient documentation

## 2018-09-07 DIAGNOSIS — Z950 Presence of cardiac pacemaker: Secondary | ICD-10-CM | POA: Insufficient documentation

## 2018-09-07 DIAGNOSIS — Z885 Allergy status to narcotic agent status: Secondary | ICD-10-CM | POA: Insufficient documentation

## 2018-09-07 DIAGNOSIS — E7800 Pure hypercholesterolemia, unspecified: Secondary | ICD-10-CM | POA: Diagnosis not present

## 2018-09-07 DIAGNOSIS — Z85820 Personal history of malignant melanoma of skin: Secondary | ICD-10-CM | POA: Insufficient documentation

## 2018-09-07 DIAGNOSIS — Z79899 Other long term (current) drug therapy: Secondary | ICD-10-CM | POA: Insufficient documentation

## 2018-09-07 DIAGNOSIS — I10 Essential (primary) hypertension: Secondary | ICD-10-CM | POA: Insufficient documentation

## 2018-09-07 DIAGNOSIS — I495 Sick sinus syndrome: Secondary | ICD-10-CM | POA: Diagnosis not present

## 2018-09-07 DIAGNOSIS — Z85828 Personal history of other malignant neoplasm of skin: Secondary | ICD-10-CM | POA: Insufficient documentation

## 2018-09-07 DIAGNOSIS — Z7901 Long term (current) use of anticoagulants: Secondary | ICD-10-CM | POA: Diagnosis not present

## 2018-09-07 DIAGNOSIS — Z888 Allergy status to other drugs, medicaments and biological substances status: Secondary | ICD-10-CM | POA: Insufficient documentation

## 2018-09-07 DIAGNOSIS — K219 Gastro-esophageal reflux disease without esophagitis: Secondary | ICD-10-CM | POA: Diagnosis not present

## 2018-09-07 LAB — BASIC METABOLIC PANEL
Anion gap: 6 (ref 5–15)
BUN: 30 mg/dL — AB (ref 8–23)
CALCIUM: 10.6 mg/dL — AB (ref 8.9–10.3)
CO2: 23 mmol/L (ref 22–32)
CREATININE: 1.24 mg/dL — AB (ref 0.44–1.00)
Chloride: 109 mmol/L (ref 98–111)
GFR calc Af Amer: 45 mL/min — ABNORMAL LOW (ref >60)
GFR, EST NON AFRICAN AMERICAN: 39 mL/min — AB (ref >60)
GLUCOSE: 100 mg/dL — AB (ref 70–99)
Potassium: 5.3 mmol/L — ABNORMAL HIGH (ref 3.5–5.1)
Sodium: 138 mmol/L (ref 135–145)

## 2018-09-07 LAB — MAGNESIUM: MAGNESIUM: 1.9 mg/dL (ref 1.7–2.4)

## 2018-09-07 NOTE — Progress Notes (Signed)
Primary Care Physician: Antony Contras, MD Referring Physician:Dr.Turner EP: Dr. [name] is a 84 y.o. female with a h/o afib on Tikosyn. It has been difficult for Dr. Radford Pax to keep her magnesium at 2 or above for continued Tikosyn use. With higher doses of magnesium, she developed significant diarrhea. Mag supplements were stopped one week ago and her mag yesterday was 1.8. She is in the afib clinic to discuss if she can safely stay on Tikosyn. She is very symptomatic in afib, having failed amiodarone and sotalol She is not the best candidate for ablation, therefore she has no options, she is staying in SR with tikosyn.   F/u in afib clinic, 11/13. For  the last week, she has been taking one mag supplement only. AShe has not had any diarrhea. I asked her  to hold PPI to see if mag absorption could be improved and she started having some indigestion symptoms and went back to 1/2 tablet a day. She remains in SR and will draw a mag level today.   Today, she denies symptoms of palpitations, chest pain, shortness of breath, orthopnea, PND, lower extremity edema, dizziness, presyncope, syncope, or neurologic sequela. The patient is tolerating medications without difficulties and is otherwise without complaint today.   Past Medical History:  Diagnosis Date  . Aortic insufficiency    mild by echo 2017  . Cancer ([place])    Skin cancer- basal.  1 mylenoma  . Complication of anesthesia   . DCM (dilated cardiomyopathy) ([place])    EF 40-45% by echo 2017 - nuclear stress test with no ischemia  . GERD (gastroesophageal reflux disease)   . H/O benign essential tremor    on amiodarone resolved off amio  . H/O hyperkalemia    Resolved off ACE inhibitors  . High cholesterol   . History of blood transfusion   . Hyperparathyroidism ([place])   . Hypertension   . Mitral regurgitation    mild to moderate by echo 2017  . Persistent atrial fibrillation   . PONV (postoperative nausea and  vomiting)   . PVC's (premature ventricular contractions)   . Renal insufficiency    MILD  . Shortness of breath    with exertion  . Tachycardia-bradycardia syndrome [facility])    s/p PPM 02/2013   Past Surgical History:  Procedure Laterality Date  . AMPUTATION Right 11/27/2014   Procedure: RIGHT 2ND AND 3RD TOE AMPUTATION;  Surgeon: Wylene Simmer, MD;  Location: [place];  Service: Orthopedics;  Laterality: Right;  . BUNIONECTOMY     Right  . CARDIAC CATHETERIZATION  09/25/2000   EF of 50% -- with normal left ventricular size and function  . CARDIOVERSION N/A 12/28/2017   Procedure: CARDIOVERSION;  Surgeon: Dorothy Spark, MD;  Location: [facility] ENDOSCOPY;  Service: Cardiovascular;  Laterality: N/A;  . CARDIOVERSION N/A 02/25/2018   Procedure: CARDIOVERSION;  Surgeon: Skeet Latch, MD;  Location: [place];  Service: Cardiovascular;  Laterality: N/A;  . CESAREAN SECTION    . CESAREAN SECTION    . EYE SURGERY Bilateral    Cataract  . FOOT SURGERY    . INSERT / REPLACE / REMOVE PACEMAKER  02/2013  . PACEMAKER INSERTION  02/2013  . PERMANENT PACEMAKER INSERTION N/A 03/02/2013   Procedure: PERMANENT PACEMAKER INSERTION;  Surgeon: Evans Lance, MD;  Location: [facility] CATH LAB;  Service: Cardiovascular;  Laterality: N/A;  . SHOULDER ARTHROSCOPY W/ ROTATOR CUFF REPAIR    . SHOULDER SURGERY Right  roto cuff  . TOE AMPUTATION Left    2nd and 3rd, bunion under toes.  . TONSILLECTOMY    . VARICOSE VEIN SURGERY      Current Outpatient Medications  Medication Sig Dispense Refill  . acetaminophen (TYLENOL) 500 MG tablet Take 1,000 mg by mouth every 6 (six) hours as needed. For pain    . atorvastatin (LIPITOR) 10 MG tablet Take 10 mg by mouth every evening.    . carvedilol (COREG) 3.125 MG tablet TAKE 1 TABLET TWICE A DAY WITH A MEAL 180 tablet 3  . dofetilide (TIKOSYN) 250 MCG capsule TAKE ONE CAPSULE (250MG  TOTAL) BY MOUTH TWO TIMES DAILY 180 capsule 3  . dorzolamide (TRUSOPT) 2 % ophthalmic  solution Place 1 drop into the right eye 2 (two) times daily.    Marland Kitchen LORazepam (ATIVAN) 0.5 MG tablet Take 0.5 tablets by mouth at bedtime as needed.  0  . losartan (COZAAR) 25 MG tablet Take 0.5 tablets (12.5 mg total) by mouth daily. 45 tablet 2  . Magnesium 250 MG TABS Take 1 tablet by mouth daily.    . Multiple Vitamin (MULTIVITAMIN WITH MINERALS) TABS Take 1 tablet by mouth daily.    Marland Kitchen spironolactone (ALDACTONE) 25 MG tablet Take 0.5 tablets (12.5 mg total) by mouth daily. 45 tablet 3  . warfarin (COUMADIN) 5 MG tablet TAKE 1/2 TO 1 TABLET DAILY AS DIRECTED BY ANTICOAGULATION CLINIC 90 tablet 1   No current facility-administered medications for this encounter.     Allergies  Allergen Reactions  . Codeine Nausea Only  . Tramadol Nausea Only    Social History   Socioeconomic History  . Marital status: Married    Spouse name: Not on file  . Number of children: Not on file  . Years of education: Not on file  . Highest education level: Not on file  Occupational History  . Not on file  Social Needs  . Financial resource strain: Not on file  . Food insecurity:    Worry: Not on file    Inability: Not on file  . Transportation needs:    Medical: Not on file    Non-medical: Not on file  Tobacco Use  . Smoking status: Never Smoker  . Smokeless tobacco: Never Used  Substance and Sexual Activity  . Alcohol use: No  . Drug use: No  . Sexual activity: Not Currently  Lifestyle  . Physical activity:    Days per week: Not on file    Minutes per session: Not on file  . Stress: Not on file  Relationships  . Social connections:    Talks on phone: Not on file    Gets together: Not on file    Attends religious service: Not on file    Active member of club or organization: Not on file    Attends meetings of clubs or organizations: Not on file    Relationship status: Not on file  . Intimate partner violence:    Fear of current or ex partner: Not on file    Emotionally abused: Not  on file    Physically abused: Not on file    Forced sexual activity: Not on file  Other Topics Concern  . Not on file  Social History Narrative  . Not on file    Family History  Problem Relation Age of Onset  . Hypertension Mother   . Hypertension Father     ROS- All systems are reviewed and negative except as per the HPI  above  Physical Exam: Vitals:   09/07/18 1511  BP: 118/72  Pulse: 70  Weight: 85.7 kg  Height: 5\' 5"  (1.651 m)   Wt Readings from Last 3 Encounters:  09/07/18 85.7 kg  08/31/18 86.3 kg  06/20/18 87 kg    Labs: Lab Results  Component Value Date   NA 140 08/24/2018   K 5.6 (H) 08/24/2018   CL 107 (H) 08/24/2018   CO2 23 08/24/2018   GLUCOSE 86 08/24/2018   BUN 29 (H) 08/24/2018   CREATININE 1.23 (H) 08/24/2018   CALCIUM 10.7 (H) 08/24/2018   PHOS 3.2 02/23/2017   MG 1.8 08/30/2018   Lab Results  Component Value Date   INR 2.3 08/09/2018   Lab Results  Component Value Date   CHOL 156 05/23/2012   HDL 80 05/23/2012   LDLCALC 59 05/23/2012   TRIG 86 05/23/2012     GEN- The patient is well appearing, alert and oriented x 3 today.   Head- normocephalic, atraumatic Eyes-  Sclera clear, conjunctiva pink Ears- hearing intact Oropharynx- clear Neck- supple, no JVP Lymph- no cervical lymphadenopathy Lungs- Clear to ausculation bilaterally, normal work of breathing Heart- Regular rate and rhythm, no murmurs, rubs or gallops, PMI not laterally displaced GI- soft, NT, ND, + BS Extremities- no clubbing, cyanosis, or edema MS- no significant deformity or atrophy Skin- no rash or lesion Psych- euthymic mood, full affect Neuro- strength and sensation are intact  EKG-a paced rhythm at 70 bpm, pr int 312 ms, qrs int 128 ms, qtc 460 ms    Assessment and Plan: 1. Symptomatic persistent Afib Failed amio/sotalol in the past, no other drug options  Staying in SR with Tikosyn but magnesium has been difficult to keep at 2.0, due to high dose mag  supplements giving her diarrhea. I discussed with PharmD and if we can keep mag at 1.8 or above, she can safely continue tikosyn Currently,  just on one mag tablet a day, no diarrhea  She is currently just  on 1/2 tab of Prilosec as she started having some indigestion Bmet/mag  I will let pt know results and f/u from here once labs are reviewed  Butch Penny C. Sondos Wolfman, [facility] [address] [place], [place] [address] [phone]

## 2018-09-19 ENCOUNTER — Ambulatory Visit (INDEPENDENT_AMBULATORY_CARE_PROVIDER_SITE_OTHER): Payer: Medicare Other | Admitting: Family Medicine

## 2018-09-19 ENCOUNTER — Encounter: Payer: Self-pay | Admitting: Family Medicine

## 2018-09-19 VITALS — BP 125/76 | HR 72 | Temp 97.7°F | Resp 12 | Ht 65.0 in | Wt 190.2 lb

## 2018-09-19 DIAGNOSIS — F419 Anxiety disorder, unspecified: Secondary | ICD-10-CM | POA: Insufficient documentation

## 2018-09-19 DIAGNOSIS — R5382 Chronic fatigue, unspecified: Secondary | ICD-10-CM | POA: Diagnosis not present

## 2018-09-19 DIAGNOSIS — M545 Low back pain: Secondary | ICD-10-CM

## 2018-09-19 DIAGNOSIS — M549 Dorsalgia, unspecified: Secondary | ICD-10-CM

## 2018-09-19 DIAGNOSIS — E785 Hyperlipidemia, unspecified: Secondary | ICD-10-CM | POA: Diagnosis not present

## 2018-09-19 DIAGNOSIS — G8929 Other chronic pain: Secondary | ICD-10-CM

## 2018-09-19 DIAGNOSIS — I10 Essential (primary) hypertension: Secondary | ICD-10-CM

## 2018-09-19 MED ORDER — DULOXETINE HCL 30 MG PO CPEP: 30.0000 mg | ORAL_CAPSULE | Freq: Every day | ORAL | 1 refills | Status: DC

## 2018-09-19 NOTE — Assessment & Plan Note (Signed)
No changes in lorazepam 0.5 mg 0.5 to 1 tablet daily as needed. Some side effects discussed. Cymbalta 30 mg daily may also help. Follow-up in 6 weeks, before if needed.

## 2018-09-19 NOTE — Progress Notes (Signed)
HPI:   [name] is a 84 y.o. female, who is here today to establish care.  Former PCP: Dr Martha Clan at [facility] Last preventive routine visit: 06/2018.   Chronic medical problems: Anxiety, atrial fibrillation, GERD, chronic anticoagulation, hypertension, CKD,OA,  and chronic lower back pain. HTN: Currently she is on Spironolactone 25 mg daily,Coreg 3.125 mg bid, and Losartan 25 mg daily.  Atrial fib and sick sinus synd s/p pacemaker placement she follows with cardiologists (Dr Radford Pax and Dr [name]). Denies chest pain ,worsening exertional dyspnea, dizziness,orthopnea, PND,or edema. She is on Tykosyn 50 mg bid and coumadin.  HypoMg, she does not tolerate high doses of Mg supplementation,caues diarrhea.  HLD: She is on Simvastatin 20 mg daily.  Last FLP in 06/2018  TC 168 HLD:74 LDL:72 TG:110 She follows a low fat diet. Tolerating med well,no side effects reported.   Concerns today: Fatigue, which has been going on for a while now.  She has had "low energy level" for "long time." She dies not feel rested in the morning when she gets up. No known Hx of OSA.  "Little depression" because she cannot do all the things she would like to do.Hx of lower back pain and unstable gait, she needs a cane to help with assistance. She has limited activities like shopping due to pain. States that she has received back "shots", they have not help. She takes Tylenol prn.  Leg cramps 2-3 times per week and nocturia x 1, all these interferes with sleep.  Anxiety, she is on Ativan 0.5 mg daily,takes 1/2 tab a bedtime to help her sleep. She as been on this med for years.   Review of Systems  Constitutional: Positive for activity change and fatigue. Negative for appetite change and fever.  HENT: Negative for mouth sores, nosebleeds and trouble swallowing.   Eyes: Negative for redness and visual disturbance.  Respiratory: Positive for shortness of breath (Chronic and  stable.). Negative for cough and wheezing.   Cardiovascular: Negative for chest pain, palpitations and leg swelling.  Gastrointestinal: Negative for abdominal pain, nausea and vomiting.       Negative for changes in bowel habits.  Genitourinary: Negative for decreased urine volume, dysuria and hematuria.  Musculoskeletal: Positive for back pain and gait problem.  Neurological: Negative for syncope, weakness and headaches.  Psychiatric/Behavioral: Positive for sleep disturbance. Negative for confusion. The patient is nervous/anxious.       Current Outpatient Medications on File Prior to Visit  Medication Sig Dispense Refill  . acetaminophen (TYLENOL) 500 MG tablet Take 1,000 mg by mouth every 6 (six) hours as needed. For pain    . carvedilol (COREG) 3.125 MG tablet TAKE 1 TABLET TWICE A DAY WITH A MEAL 180 tablet 3  . dofetilide (TIKOSYN) 250 MCG capsule TAKE ONE CAPSULE (250MG  TOTAL) BY MOUTH TWO TIMES DAILY 180 capsule 3  . dorzolamide (TRUSOPT) 2 % ophthalmic solution Place 1 drop into the right eye 2 (two) times daily.    Marland Kitchen LORazepam (ATIVAN) 0.5 MG tablet Take 0.5 tablets by mouth at bedtime as needed.  0  . losartan (COZAAR) 25 MG tablet Take 0.5 tablets (12.5 mg total) by mouth daily. 45 tablet 2  . Magnesium 250 MG TABS Take 1 tablet by mouth daily.    . Multiple Vitamin (MULTIVITAMIN WITH MINERALS) TABS Take 1 tablet by mouth daily.    . simvastatin (ZOCOR) 20 MG tablet     . spironolactone (ALDACTONE) 25 MG tablet  Take 0.5 tablets (12.5 mg total) by mouth daily. 45 tablet 3  . warfarin (COUMADIN) 5 MG tablet TAKE 1/2 TO 1 TABLET DAILY AS DIRECTED BY ANTICOAGULATION CLINIC 90 tablet 1   No current facility-administered medications on file prior to visit.      Past Medical History:  Diagnosis Date  . Aortic insufficiency    mild by echo 2017  . Cancer ([place])    Skin cancer- basal.  1 mylenoma  . Complication of anesthesia   . DCM (dilated cardiomyopathy) ([place])    EF 40-45%  by echo 2017 - nuclear stress test with no ischemia  . GERD (gastroesophageal reflux disease)   . H/O benign essential tremor    on amiodarone resolved off amio  . H/O hyperkalemia    Resolved off ACE inhibitors  . High cholesterol   . History of blood transfusion   . Hyperparathyroidism ([place])   . Hypertension   . Mitral regurgitation    mild to moderate by echo 2017  . Persistent atrial fibrillation   . PONV (postoperative nausea and vomiting)   . PVC's (premature ventricular contractions)   . Renal insufficiency    MILD  . Shortness of breath    with exertion  . Tachycardia-bradycardia syndrome ([place])    s/p PPM 02/2013   Allergies  Allergen Reactions  . Codeine Nausea Only  . Tramadol Nausea Only    Family History  Problem Relation Age of Onset  . Hypertension Mother   . Hypertension Father     Social History   Socioeconomic History  . Marital status: Married    Spouse name: Not on file  . Number of children: 3  . Years of education: Not on file  . Highest education level: Not on file  Occupational History  . Not on file  Social Needs  . Financial resource strain: Not on file  . Food insecurity:    Worry: Not on file    Inability: Not on file  . Transportation needs:    Medical: Not on file    Non-medical: Not on file  Tobacco Use  . Smoking status: Never Smoker  . Smokeless tobacco: Never Used  Substance and Sexual Activity  . Alcohol use: No  . Drug use: No  . Sexual activity: Not Currently  Lifestyle  . Physical activity:    Days per week: Not on file    Minutes per session: Not on file  . Stress: Not on file  Relationships  . Social connections:    Talks on phone: Not on file    Gets together: Not on file    Attends religious service: Not on file    Active member of club or organization: Not on file    Attends meetings of clubs or organizations: Not on file    Relationship status: Not on file  Other Topics Concern  . Not on file  Social  History Narrative  . Not on file    Vitals:   09/19/18 1644  BP: 125/76  Pulse: 72  Resp: 12  Temp: 97.7 F (36.5 C)  SpO2: 96%    Body mass index is 31.66 kg/m.   Physical Exam  Nursing note and vitals reviewed. Constitutional: She is oriented to person, place, and time. She appears well-developed. No distress.  HENT:  Head: Normocephalic and atraumatic.  Mouth/Throat: Oropharynx is clear and moist and mucous membranes are normal.  Eyes: Pupils are equal, round, and reactive to light. Conjunctivae are normal.  Cardiovascular: Normal rate and regular rhythm.  No murmur heard. DP pulses present bilateral.  Respiratory: Effort normal and breath sounds normal. No respiratory distress.  GI: Soft. She exhibits no mass. There is no hepatomegaly. There is no tenderness.  Musculoskeletal: She exhibits no edema.       Lumbar back: She exhibits no tenderness and no bony tenderness.  Neurological: She is alert and oriented to person, place, and time. She has normal strength. No cranial nerve deficit.  Unstable gait assisted with a cane.  Skin: Skin is warm. No rash noted. No erythema.  Psychiatric: Her mood appears anxious.  Well groomed, good eye contact.    ASSESSMENT AND PLAN:  Ms. Yue was seen today for establish care.  Diagnoses and all orders for this visit:  Essential hypertension Reporting some low BP's at home. This could be contributing to her fatigue. She has an appt with her cardiologist next week. Recommend monitoring BP at home and taking BP reading to OV. No changes in current management.   Anxiety disorder, unspecified No changes in lorazepam 0.5 mg 0.5 to 1 tablet daily as needed. Some side effects discussed. Cymbalta 30 mg daily may also help. Follow-up in 6 weeks, before if needed.  Back pain, chronic After discussion of some side effects, she agrees with trying Cymbalta. She will start with Cymbalta 30 mg daily. Follow-up in 6 weeks, before  if needed.  Hyperlipidemia, unspecified hyperlipidemia type She is not fasting today. No changes in Simvastatin. Continue low far diet. We will plan on checking FLP next OV.  -     DULoxetine (CYMBALTA) 30 MG capsule; Take 1 capsule (30 mg total) by mouth daily.  Chronic fatigue We discussed possible etiologies: Systemic illness, immunologic,endocrinology,sleep disorder, psychiatric/psychologic, infectious,medications side effects, and idiopathic. Many of her chronic medical problems as well as medications can be contributing to problem. Sleep study to be considered.   According to pt,she has appt next week with cardiologist and lab appt, so labs were not done today.       Sachi Boulay G. [place], MD  [facility]. [place] office.

## 2018-09-19 NOTE — Patient Instructions (Signed)
A few things to remember from today's visit:   Essential hypertension  Hyperlipidemia, unspecified hyperlipidemia type  Chronic bilateral low back pain, unspecified whether sciatica present - Plan: DULoxetine (CYMBALTA) 30 MG capsule  Chronic fatigue  Anxiety disorder, unspecified type  Today Cymbalta 30 mg added. No changes in rest of your medications. Please arrange appointment with Coumadin clinic.  Since you are having labs done at the cardiologist office, I do not think we need labs today.  Please be sure medication list is accurate. If a new problem present, please set up appointment sooner than planned today.

## 2018-09-19 NOTE — Assessment & Plan Note (Signed)
After discussion of some side effects, she agrees with trying Cymbalta. She will start with Cymbalta 30 mg daily. Follow-up in 6 weeks, before if needed.

## 2018-09-20 ENCOUNTER — Other Ambulatory Visit: Payer: Medicare Other

## 2018-09-26 ENCOUNTER — Ambulatory Visit (INDEPENDENT_AMBULATORY_CARE_PROVIDER_SITE_OTHER): Payer: Medicare Other

## 2018-09-26 ENCOUNTER — Encounter: Payer: Self-pay | Admitting: Internal Medicine

## 2018-09-26 ENCOUNTER — Ambulatory Visit (INDEPENDENT_AMBULATORY_CARE_PROVIDER_SITE_OTHER): Payer: Medicare Other | Admitting: Internal Medicine

## 2018-09-26 VITALS — BP 138/90 | HR 82 | Ht 65.0 in | Wt 190.8 lb

## 2018-09-26 DIAGNOSIS — Z5181 Encounter for therapeutic drug level monitoring: Secondary | ICD-10-CM

## 2018-09-26 DIAGNOSIS — I4819 Other persistent atrial fibrillation: Secondary | ICD-10-CM | POA: Diagnosis not present

## 2018-09-26 DIAGNOSIS — I420 Dilated cardiomyopathy: Secondary | ICD-10-CM | POA: Diagnosis not present

## 2018-09-26 DIAGNOSIS — I5042 Chronic combined systolic (congestive) and diastolic (congestive) heart failure: Secondary | ICD-10-CM | POA: Diagnosis not present

## 2018-09-26 DIAGNOSIS — Z950 Presence of cardiac pacemaker: Secondary | ICD-10-CM | POA: Diagnosis not present

## 2018-09-26 DIAGNOSIS — I495 Sick sinus syndrome: Secondary | ICD-10-CM

## 2018-09-26 DIAGNOSIS — I4891 Unspecified atrial fibrillation: Secondary | ICD-10-CM

## 2018-09-26 LAB — POCT INR: INR: 3.7 — AB (ref 2.0–3.0)

## 2018-09-26 LAB — CUP PACEART INCLINIC DEVICE CHECK
Date Time Interrogation Session: 20191202141349
Implantable Lead Implant Date: 20140508
Implantable Lead Location: 753859
Implantable Lead Model: 4136
Implantable Lead Serial Number: 29333020
Implantable Pulse Generator Implant Date: 20140508
MDC IDC LEAD IMPLANT DT: 20140508
MDC IDC LEAD LOCATION: 753860
MDC IDC LEAD SERIAL: 29379474
MDC IDC PG SERIAL: 112392

## 2018-09-26 NOTE — Patient Instructions (Addendum)
Medication Instructions:  Your physician recommends that you continue on your current medications as directed. Please refer to the Current Medication list given to you today.  Labwork: You will get lab work today:  BMP, Magnesium and TSH  Testing/Procedures: None ordered.  Follow-Up: Your physician wants you to follow-up in: one year with Dr. [name].   You will receive a reminder letter in the mail two months in advance. If you don't receive a letter, please call our office to schedule the follow-up appointment.  Remote monitoring is used to monitor your Pacemaker from home. This monitoring reduces the number of office visits required to check your device to one time per year. It allows [place] to keep an eye on the functioning of your device to ensure it is working properly. You are scheduled for a device check from home on 12/26/2018. You may send your transmission at any time that day. If you have a wireless device, the transmission will be sent automatically. After your physician reviews your transmission, you will receive a postcard with your next transmission date.  Any Other Special Instructions Will Be Listed Below (If Applicable).  If you need a refill on your cardiac medications before your next appointment, please call your pharmacy.

## 2018-09-26 NOTE — Patient Instructions (Signed)
Description   Skip today's dosage of Coumadin, then resume same dosage 1/2 tablet everyday except 1 tablet on Mondays, Wednesdays and Fridays.   Recheck in 2 weeks. Pt is going to start having Coumadin managed and monitored by her new [facility] primary care physician.  Call us with any medication changes or concerns # [phone] Coumadin Clinic, Main # [phone].

## 2018-09-26 NOTE — Progress Notes (Signed)
Remote pacemaker transmission.   

## 2018-09-26 NOTE — Progress Notes (Signed)
HPI Molly [name] returns today for ongoing evaluation and management of her atrial fib. She has been on dofetilide and had ERAF and underwent repeat DCCV and has been stable. She feels some fatigue and tiredness since Thanksgiving. She denies chest pain or sob. She was noted to be mildly anemic. She has sinus node dysfunction but is asymptomatic, s/p PPM.   Allergies  Allergen Reactions  . Codeine Nausea Only  . Tramadol Nausea Only     Current Outpatient Medications  Medication Sig Dispense Refill  . acetaminophen (TYLENOL) 500 MG tablet Take 1,000 mg by mouth every 6 (six) hours as needed. For pain    . carvedilol (COREG) 3.125 MG tablet TAKE 1 TABLET TWICE A DAY WITH A MEAL 180 tablet 3  . dofetilide (TIKOSYN) 250 MCG capsule TAKE ONE CAPSULE (250MG  TOTAL) BY MOUTH TWO TIMES DAILY 180 capsule 3  . dorzolamide (TRUSOPT) 2 % ophthalmic solution Place 1 drop into the right eye 2 (two) times daily.    . DULoxetine (CYMBALTA) 30 MG capsule Take 1 capsule (30 mg total) by mouth daily. 30 capsule 1  . LORazepam (ATIVAN) 0.5 MG tablet Take 0.5 tablets by mouth at bedtime as needed.  0  . losartan (COZAAR) 25 MG tablet Take 0.5 tablets (12.5 mg total) by mouth daily. 45 tablet 2  . Magnesium 250 MG TABS Take 1 tablet by mouth daily.    . Multiple Vitamin (MULTIVITAMIN WITH MINERALS) TABS Take 1 tablet by mouth daily.    . simvastatin (ZOCOR) 20 MG tablet Take 20 mg by mouth daily at 6 PM.     . spironolactone (ALDACTONE) 25 MG tablet Take 0.5 tablets (12.5 mg total) by mouth daily. 45 tablet 3  . warfarin (COUMADIN) 5 MG tablet TAKE 1/2 TO 1 TABLET DAILY AS DIRECTED BY ANTICOAGULATION CLINIC 90 tablet 1   No current facility-administered medications for this visit.      Past Medical History:  Diagnosis Date  . Aortic insufficiency    mild by echo 2017  . Cancer ([place])    Skin cancer- basal.  1 mylenoma  . Complication of anesthesia   . DCM (dilated cardiomyopathy) ([place])    EF  40-45% by echo 2017 - nuclear stress test with no ischemia  . GERD (gastroesophageal reflux disease)   . H/O benign essential tremor    on amiodarone resolved off amio  . H/O hyperkalemia    Resolved off ACE inhibitors  . High cholesterol   . History of blood transfusion   . Hyperparathyroidism ([place])   . Hypertension   . Mitral regurgitation    mild to moderate by echo 2017  . Persistent atrial fibrillation   . PONV (postoperative nausea and vomiting)   . PVC's (premature ventricular contractions)   . Renal insufficiency    MILD  . Shortness of breath    with exertion  . Tachycardia-bradycardia syndrome ([place])    s/p PPM 02/2013    ROS:   All systems reviewed and negative except as noted in the HPI.   Past Surgical History:  Procedure Laterality Date  . AMPUTATION Right 11/27/2014   Procedure: RIGHT 2ND AND 3RD TOE AMPUTATION;  Surgeon: Wylene Simmer, MD;  Location: [facility];  Service: Orthopedics;  Laterality: Right;  . BUNIONECTOMY     Right  . CARDIAC CATHETERIZATION  09/25/2000   EF of 50% -- with normal left ventricular size and function  . CARDIOVERSION N/A 12/28/2017   Procedure: CARDIOVERSION;  Surgeon: Dorothy Spark, MD;  Location: [facility] ENDOSCOPY;  Service: Cardiovascular;  Laterality: N/A;  . CARDIOVERSION N/A 02/25/2018   Procedure: CARDIOVERSION;  Surgeon: Skeet Latch, MD;  Location: [facility];  Service: Cardiovascular;  Laterality: N/A;  . CESAREAN SECTION    . CESAREAN SECTION    . EYE SURGERY Bilateral    Cataract  . FOOT SURGERY    . INSERT / REPLACE / REMOVE PACEMAKER  02/2013  . PACEMAKER INSERTION  02/2013  . PERMANENT PACEMAKER INSERTION N/A 03/02/2013   Procedure: PERMANENT PACEMAKER INSERTION;  Surgeon: Evans Lance, MD;  Location: [facility] CATH LAB;  Service: Cardiovascular;  Laterality: N/A;  . SHOULDER ARTHROSCOPY W/ ROTATOR CUFF REPAIR    . SHOULDER SURGERY Right    roto cuff  . TOE AMPUTATION Left    2nd and 3rd, bunion under toes.  .  TONSILLECTOMY    . VARICOSE VEIN SURGERY       Family History  Problem Relation Age of Onset  . Hypertension Mother   . Hypertension Father      Social History   Socioeconomic History  . Marital status: Married    Spouse name: Not on file  . Number of children: 3  . Years of education: Not on file  . Highest education level: Not on file  Occupational History  . Not on file  Social Needs  . Financial resource strain: Not on file  . Food insecurity:    Worry: Not on file    Inability: Not on file  . Transportation needs:    Medical: Not on file    Non-medical: Not on file  Tobacco Use  . Smoking status: Never Smoker  . Smokeless tobacco: Never Used  Substance and Sexual Activity  . Alcohol use: No  . Drug use: No  . Sexual activity: Not Currently  Lifestyle  . Physical activity:    Days per week: Not on file    Minutes per session: Not on file  . Stress: Not on file  Relationships  . Social connections:    Talks on phone: Not on file    Gets together: Not on file    Attends religious service: Not on file    Active member of club or organization: Not on file    Attends meetings of clubs or organizations: Not on file    Relationship status: Not on file  . Intimate partner violence:    Fear of current or ex partner: Not on file    Emotionally abused: Not on file    Physically abused: Not on file    Forced sexual activity: Not on file  Other Topics Concern  . Not on file  Social History Narrative  . Not on file     BP 138/90   Pulse 82   Ht 5\' 5"  (1.651 m)   Wt 190 lb 12.8 oz (86.5 kg)   SpO2 98%   BMI 31.75 kg/m   Physical Exam:  Well appearing NAD HEENT: Unremarkable Neck:  No JVD, no thyromegally Lymphatics:  No adenopathy Back:  No CVA tenderness Lungs:  Clear with no wheezes HEART:  Regular rate rhythm, no murmurs, no rubs, no clicks Abd:  soft, positive bowel sounds, no organomegally, no rebound, no guarding Ext:  2 plus pulses, no  edema, no cyanosis, no clubbing Skin:  No rashes no nodules Neuro:  CN II through XII intact, motor grossly intact  DEVICE  Normal device function.  See PaceArt for details.  Assess/Plan: 1. Atrial fib - she appears to be maintaining NSR. 2. Fatigue - the etiology is unclear. She will check her TSH. 3. PPM - her b[place] Sci DDD PM is working normally. We will recheck in several months. 4. Chronic systolic heart failure - she does not appear volume overloaded today. She will continue her current meds.  Mikle Bosworth.D.

## 2018-09-27 ENCOUNTER — Other Ambulatory Visit: Payer: Medicare Other

## 2018-09-27 ENCOUNTER — Other Ambulatory Visit (HOSPITAL_COMMUNITY): Payer: Self-pay | Admitting: *Deleted

## 2018-09-27 LAB — BASIC METABOLIC PANEL
BUN/Creatinine Ratio: 23 (ref 12–28)
BUN: 27 mg/dL (ref 8–27)
CALCIUM: 10.7 mg/dL — AB (ref 8.7–10.3)
CO2: 20 mmol/L (ref 20–29)
Chloride: 104 mmol/L (ref 96–106)
Creatinine, Ser: 1.16 mg/dL — ABNORMAL HIGH (ref 0.57–1.00)
GFR calc Af Amer: 50 mL/min/{1.73_m2} — ABNORMAL LOW (ref >59)
GFR, EST NON AFRICAN AMERICAN: 44 mL/min/{1.73_m2} — AB (ref >59)
Glucose: 95 mg/dL (ref 65–99)
POTASSIUM: 5.2 mmol/L (ref 3.5–5.2)
SODIUM: 140 mmol/L (ref 134–144)

## 2018-09-27 LAB — TSH: TSH: 3.75 u[IU]/mL (ref 0.450–4.500)

## 2018-09-27 LAB — MAGNESIUM: MAGNESIUM: 1.7 mg/dL (ref 1.6–2.3)

## 2018-09-28 ENCOUNTER — Other Ambulatory Visit (HOSPITAL_COMMUNITY): Payer: Self-pay | Admitting: *Deleted

## 2018-09-28 DIAGNOSIS — I4819 Other persistent atrial fibrillation: Secondary | ICD-10-CM

## 2018-10-04 ENCOUNTER — Telehealth: Payer: Self-pay | Admitting: Family Medicine

## 2018-10-04 NOTE — Telephone Encounter (Signed)
Copied from [place] [phone]. Topic: Quick Communication - See Telephone Encounter >> Oct 04, 2018  3:06 PM Antonieta Iba C wrote: CRM for notification. See Telephone encounter for: 10/04/18.  Pt says that she was told by her previous PCP to have the office fax for medical records.   [facility]  Fax: [phone]  Phone: [phone]

## 2018-10-05 NOTE — Telephone Encounter (Signed)
Left message for patient to return call to clinic concerning records.

## 2018-10-10 ENCOUNTER — Ambulatory Visit (INDEPENDENT_AMBULATORY_CARE_PROVIDER_SITE_OTHER): Payer: Medicare Other | Admitting: *Deleted

## 2018-10-10 ENCOUNTER — Other Ambulatory Visit: Payer: Self-pay

## 2018-10-10 ENCOUNTER — Other Ambulatory Visit: Payer: Medicare Other | Admitting: *Deleted

## 2018-10-10 ENCOUNTER — Telehealth: Payer: Self-pay

## 2018-10-10 DIAGNOSIS — E875 Hyperkalemia: Secondary | ICD-10-CM

## 2018-10-10 DIAGNOSIS — I495 Sick sinus syndrome: Secondary | ICD-10-CM

## 2018-10-10 DIAGNOSIS — I4819 Other persistent atrial fibrillation: Secondary | ICD-10-CM

## 2018-10-10 DIAGNOSIS — Z5181 Encounter for therapeutic drug level monitoring: Secondary | ICD-10-CM

## 2018-10-10 LAB — POCT INR: INR: 2.7 (ref 2.0–3.0)

## 2018-10-10 NOTE — Telephone Encounter (Signed)
Copied from [place] [id number]. Topic: General - Inquiry >> Oct 10, 2018  3:50 PM Windy Kalata wrote: Reason for CRM: Pt is calling to see if she can have her Coumadin managed at [facility] she previously established care with Betty Martinique in November 2019, can she be scheduled at the Coumadin Clinic, please advise.  Call back number is [phone]  Please reference Dr. Doug Sou note from 09/19/18 office notes.

## 2018-10-11 LAB — BASIC METABOLIC PANEL
BUN/Creatinine Ratio: 24 (ref 12–28)
BUN: 31 mg/dL — ABNORMAL HIGH (ref 8–27)
CALCIUM: 10.4 mg/dL — AB (ref 8.7–10.3)
CO2: 20 mmol/L (ref 20–29)
CREATININE: 1.3 mg/dL — AB (ref 0.57–1.00)
Chloride: 106 mmol/L (ref 96–106)
GFR calc Af Amer: 44 mL/min/{1.73_m2} — ABNORMAL LOW (ref >59)
GFR, EST NON AFRICAN AMERICAN: 38 mL/min/{1.73_m2} — AB (ref >59)
GLUCOSE: 89 mg/dL (ref 65–99)
POTASSIUM: 5.2 mmol/L (ref 3.5–5.2)
Sodium: 137 mmol/L (ref 134–144)

## 2018-10-11 NOTE — Telephone Encounter (Signed)
Patient informed that she has to fill out form. Patient stated that she would send husband to pick up for her to sign and then bring it back to the office so that we can get records from [facility].

## 2018-10-15 LAB — MAGNESIUM: MAGNESIUM: 1.8 mg/dL (ref 1.6–2.3)

## 2018-10-15 LAB — SPECIMEN STATUS REPORT

## 2018-10-17 ENCOUNTER — Other Ambulatory Visit (HOSPITAL_COMMUNITY): Payer: Self-pay | Admitting: *Deleted

## 2018-10-17 DIAGNOSIS — I4819 Other persistent atrial fibrillation: Secondary | ICD-10-CM

## 2018-10-25 NOTE — Telephone Encounter (Signed)
Patient will be by the office Thursday or Friday

## 2018-10-28 NOTE — Telephone Encounter (Signed)
Noted  

## 2018-11-07 ENCOUNTER — Ambulatory Visit (INDEPENDENT_AMBULATORY_CARE_PROVIDER_SITE_OTHER): Payer: Medicare Other | Admitting: General Practice

## 2018-11-07 DIAGNOSIS — I4819 Other persistent atrial fibrillation: Secondary | ICD-10-CM

## 2018-11-07 DIAGNOSIS — Z5181 Encounter for therapeutic drug level monitoring: Secondary | ICD-10-CM

## 2018-11-07 LAB — MAGNESIUM: Magnesium: 1.7 mg/dL (ref 1.5–2.5)

## 2018-11-07 LAB — POCT INR: INR: 2.8 (ref 2.0–3.0)

## 2018-11-07 NOTE — Patient Instructions (Addendum)
Pre visit review using our clinic review tool, if applicable. No additional management support is needed unless otherwise documented below in the visit note.  Continue taking same dosage 1/2 tablet everyday except 1 tablet on Mondays, Wednesdays and Fridays.   Recheck in 4 weeks.

## 2018-11-12 LAB — CUP PACEART REMOTE DEVICE CHECK
Battery Remaining Percentage: 94 %
Brady Statistic RA Percent Paced: 90 %
Date Time Interrogation Session: 20191202132800
Implantable Lead Implant Date: 20140508
Implantable Lead Location: 753859
Implantable Lead Location: 753860
Implantable Lead Model: 4135
Implantable Lead Model: 4136
Implantable Lead Serial Number: 29379474
Lead Channel Impedance Value: 595 Ohm
Lead Channel Impedance Value: 679 Ohm
Lead Channel Pacing Threshold Amplitude: 0.9 V
Lead Channel Setting Pacing Amplitude: 2 V
Lead Channel Setting Pacing Amplitude: 2.4 V
MDC IDC LEAD IMPLANT DT: 20140508
MDC IDC LEAD SERIAL: 29333020
MDC IDC MSMT BATTERY REMAINING LONGEVITY: 90 mo
MDC IDC MSMT LEADCHNL RA PACING THRESHOLD PULSEWIDTH: 0.4 ms
MDC IDC PG IMPLANT DT: 20140508
MDC IDC SET LEADCHNL RV PACING PULSEWIDTH: 0.4 ms
MDC IDC SET LEADCHNL RV SENSING SENSITIVITY: 2.5 mV
MDC IDC STAT BRADY RV PERCENT PACED: 11 %
Pulse Gen Serial Number: 112392

## 2018-11-30 ENCOUNTER — Encounter: Payer: Self-pay | Admitting: Family Medicine

## 2018-11-30 ENCOUNTER — Ambulatory Visit (INDEPENDENT_AMBULATORY_CARE_PROVIDER_SITE_OTHER): Payer: Medicare Other | Admitting: Family Medicine

## 2018-11-30 VITALS — BP 120/80 | HR 72 | Temp 97.7°F | Resp 12 | Ht 65.0 in | Wt 189.5 lb

## 2018-11-30 DIAGNOSIS — L853 Xerosis cutis: Secondary | ICD-10-CM

## 2018-11-30 DIAGNOSIS — R5382 Chronic fatigue, unspecified: Secondary | ICD-10-CM | POA: Diagnosis not present

## 2018-11-30 DIAGNOSIS — L299 Pruritus, unspecified: Secondary | ICD-10-CM

## 2018-11-30 DIAGNOSIS — F419 Anxiety disorder, unspecified: Secondary | ICD-10-CM

## 2018-11-30 MED ORDER — TRIAMCINOLONE 0.1 % CREAM:EUCERIN CREAM 1:1: 1.0000 "application " | TOPICAL_CREAM | Freq: Two times a day (BID) | CUTANEOUS | 1 refills | Status: DC | PRN

## 2018-11-30 MED ORDER — TRIAMCINOLONE ACETONIDE 0.5 % EX OINT: 1.0000 "application " | TOPICAL_OINTMENT | Freq: Two times a day (BID) | CUTANEOUS | 0 refills | Status: AC | PRN

## 2018-11-30 NOTE — Progress Notes (Signed)
ACUTE VISIT   HPI:  Chief Complaint  Patient presents with  . Rash    itchy red rash that started last night after taking a shower, redness is gone now, but itchy all over     Molly [name] is a 85 y.o. female, who is here today complaining of "itching all over." Problem started over a week ago with scalp itching, no lesions.  It has been going on intermittent for over a year. Dermatologist has recommended OTC scalp for pruritus, does not help.  Skin pruritus is exacerbated by hot shower. He has not noted skin rash,transient skin erythema after shower.  A week ago she had mild lip edema that lasted 2 days. Denies any stridor, odynophagia, or dysphagia.  She denies any new medication, detergent, soap, or body product. No known insect bite or outdoor exposures to plants. No sick contact. No Hx of eczema or similar rash in the past.  OTC medication for this problem: Benadryl, which caused tremor, insomnia, and "not feeling well."   Negative for oral lesions/edema,cough, wheezing, dyspnea, abdominal pain, nausea, or vomiting.  Last office visit I recommended Cymbalta 30 mg to help with anxiety and chronic pain.  She discontinued medication after 3 days, she has some dizziness and did not feel well. Joint and back pain is aggravated by walking,standing,movement. Alleviated by rest.  Frustrated because she is not able to do everything she wants to do or needs to go due to fatigue and worsening of pain and fatigue.  Anxiety: She is on Lorazepam 0.5 mg ,takes 1/2 tab daily but has a hard time splitting tab. Sometimes she takes whole tab and feels better than when she takes 1/2 tab. She wonders if she can take the whole tab instead. She denies side effects. It helps with acuter anxiety symptoms.   Review of Systems  Constitutional: Positive for fatigue. Negative for appetite change, chills and fever.  HENT: Negative for congestion, mouth sores, rhinorrhea and  sore throat.   Eyes: Negative for discharge and redness.  Respiratory: Negative for cough, shortness of breath and wheezing.   Cardiovascular: Negative for chest pain and palpitations.  Gastrointestinal: Negative for abdominal pain, diarrhea, nausea and vomiting.  Musculoskeletal: Positive for arthralgias, back pain and gait problem. Negative for joint swelling.  Skin: Positive for rash. Negative for wound.  Allergic/Immunologic: Negative for environmental allergies.  Neurological: Negative for weakness, numbness and headaches.  Psychiatric/Behavioral: Negative for confusion. The patient is nervous/anxious.      Current Outpatient Medications on File Prior to Visit  Medication Sig Dispense Refill  . acetaminophen (TYLENOL) 500 MG tablet Take 1,000 mg by mouth every 6 (six) hours as needed. For pain    . carvedilol (COREG) 3.125 MG tablet TAKE 1 TABLET TWICE A DAY WITH A MEAL 180 tablet 3  . dofetilide (TIKOSYN) 250 MCG capsule TAKE ONE CAPSULE (250MG  TOTAL) BY MOUTH TWO TIMES DAILY 180 capsule 3  . dorzolamide (TRUSOPT) 2 % ophthalmic solution Place 1 drop into the right eye 2 (two) times daily.    Marland Kitchen losartan (COZAAR) 25 MG tablet Take 0.5 tablets (12.5 mg total) by mouth daily. 45 tablet 2  . Magnesium 250 MG TABS Take 1 tablet by mouth daily. Take 1/2 tablet in the evening.    . Multiple Vitamin (MULTIVITAMIN WITH MINERALS) TABS Take 1 tablet by mouth daily.    . simvastatin (ZOCOR) 20 MG tablet Take 20 mg by mouth daily at 6 PM.     .  spironolactone (ALDACTONE) 25 MG tablet Take 0.5 tablets (12.5 mg total) by mouth daily. 45 tablet 3  . warfarin (COUMADIN) 5 MG tablet TAKE 1/2 TO 1 TABLET DAILY AS DIRECTED BY ANTICOAGULATION CLINIC 90 tablet 1   No current facility-administered medications on file prior to visit.      Past Medical History:  Diagnosis Date  . Aortic insufficiency    mild by echo 2017  . Cancer ([place])    Skin cancer- basal.  1 mylenoma  . Complication of  anesthesia   . DCM (dilated cardiomyopathy) ([place])    EF 40-45% by echo 2017 - nuclear stress test with no ischemia  . GERD (gastroesophageal reflux disease)   . H/O benign essential tremor    on amiodarone resolved off amio  . H/O hyperkalemia    Resolved off ACE inhibitors  . High cholesterol   . History of blood transfusion   . Hyperparathyroidism ([place])   . Hypertension   . Mitral regurgitation    mild to moderate by echo 2017  . Persistent atrial fibrillation   . PONV (postoperative nausea and vomiting)   . PVC's (premature ventricular contractions)   . Renal insufficiency    MILD  . Shortness of breath    with exertion  . Tachycardia-bradycardia syndrome ([place])    s/p PPM 02/2013   Allergies  Allergen Reactions  . Codeine Nausea Only  . Tramadol Nausea Only    Social History   Socioeconomic History  . Marital status: Married    Spouse name: Not on file  . Number of children: 3  . Years of education: Not on file  . Highest education level: Not on file  Occupational History  . Not on file  Social Needs  . Financial resource strain: Not on file  . Food insecurity:    Worry: Not on file    Inability: Not on file  . Transportation needs:    Medical: Not on file    Non-medical: Not on file  Tobacco Use  . Smoking status: Never Smoker  . Smokeless tobacco: Never Used  Substance and Sexual Activity  . Alcohol use: No  . Drug use: No  . Sexual activity: Not Currently  Lifestyle  . Physical activity:    Days per week: Not on file    Minutes per session: Not on file  . Stress: Not on file  Relationships  . Social connections:    Talks on phone: Not on file    Gets together: Not on file    Attends religious service: Not on file    Active member of club or organization: Not on file    Attends meetings of clubs or organizations: Not on file    Relationship status: Not on file  Other Topics Concern  . Not on file  Social History Narrative  . Not on file     Vitals:   11/30/18 1448  BP: 120/80  Pulse: 72  Resp: 12  Temp: 97.7 F (36.5 C)  SpO2: 97%   Body mass index is 31.53 kg/m.   Physical Exam  Nursing note and vitals reviewed. Constitutional: She is oriented to person, place, and time. She appears well-developed. No distress.  HENT:  Head: Normocephalic and atraumatic.  Mouth/Throat: Oropharynx is clear and moist and mucous membranes are normal.  Eyes: Conjunctivae are normal.  Cardiovascular: Normal rate and regular rhythm.  No murmur heard. Respiratory: Effort normal and breath sounds normal. No respiratory distress.  Musculoskeletal:  General: No edema.  Lymphadenopathy:    She has no cervical adenopathy.  Neurological: She is alert and oriented to person, place, and time. She has normal strength. Gait (unstable,assisted with a cane) abnormal.  Skin: Skin is warm. Lesion and rash noted. Rash is maculopapular. No erythema.  Dry skin. SK lesions on upper back and chest. Scratching signs. RUE with macular lesions, not tender,? Due to scratching.  Psychiatric: Her mood appears anxious.  Well groomed, good eye contact.    ASSESSMENT AND PLAN:   Ms. Josiah was seen today for rash.  Diagnoses and all orders for this visit:  Skin pruritus Chronic. Possible causes discussed.It could be related to dry skin and/or SK. Topical steroid bid as needed,side effects of topical steroids discussed. Keep appt with dermatologist.  -     triamcinolone ointment (KENALOG) 0.5 %; Apply 1 application topically 2 (two) times daily as needed for up to 30 days.  Dry skin dermatitis Daily moisturizer may help. Eucerin:Triamcinolone 1:1 combination to apply daily. Keep appt with dermatologist.  -     Triamcinolone Acetonide (TRIAMCINOLONE 0.1 % CREAM : EUCERIN) CREA; Apply 1 application topically 2 (two) times daily as needed for rash or itching.  Chronic fatigue Possible etiologies discussed,some of ehr chronic medical  problems and/or meds can be contributing to the problem. Taking a nap in the early afternoon may help.   Anxiety disorder, unspecified type Cymbalta was not well tolerated. Side effects of Ativan discussed. Ativan increased from 0.25 mg to 0.5 mg daily.    Return if symptoms worsen or fail to improve, for Keep next f/u appt.     Paz Fuentes G. Martinique, MD  [facility] [place] office.

## 2018-11-30 NOTE — Patient Instructions (Signed)
A few things to remember from today's visit:   Skin pruritus - Plan: triamcinolone ointment (KENALOG) 0.5 %  Dry skin dermatitis - Plan: Triamcinolone Acetonide (TRIAMCINOLONE 0.1 % CREAM : EUCERIN) CREA  Chronic fatigue  Anxiety disorder, unspecified type  Avoid Benadryl. Dry skin can be causing skin itching, so applied combination of Eucerin plus triamcinolone once daily. Zyrtec a half tablet in the morning after a night. Ativan increased to 1 tablet. Keep appointment with dermatologist in 12/2018.  Fall precautions.  Please be sure medication list is accurate. If a new problem present, please set up appointment sooner than planned today.

## 2018-12-04 MED ORDER — LORAZEPAM 0.5 MG PO TABS: 0.5000 mg | ORAL_TABLET | Freq: Every evening | ORAL | 2 refills | Status: DC | PRN

## 2018-12-05 ENCOUNTER — Ambulatory Visit: Payer: Medicare Other

## 2018-12-12 ENCOUNTER — Ambulatory Visit (INDEPENDENT_AMBULATORY_CARE_PROVIDER_SITE_OTHER): Payer: Medicare Other | Admitting: General Practice

## 2018-12-12 DIAGNOSIS — Z5181 Encounter for therapeutic drug level monitoring: Secondary | ICD-10-CM

## 2018-12-12 DIAGNOSIS — I4819 Other persistent atrial fibrillation: Secondary | ICD-10-CM

## 2018-12-12 LAB — POCT INR: INR: 2.5 (ref 2.0–3.0)

## 2018-12-12 NOTE — Patient Instructions (Addendum)
Pre visit review using our clinic review tool, if applicable. No additional management support is needed unless otherwise documented below in the visit note.  Continue taking same dosage 1/2 tablet everyday except 1 tablet on Mondays, Wednesdays and Fridays.   Recheck in 6 weeks.

## 2018-12-26 ENCOUNTER — Ambulatory Visit (INDEPENDENT_AMBULATORY_CARE_PROVIDER_SITE_OTHER): Payer: Medicare Other | Admitting: *Deleted

## 2018-12-26 DIAGNOSIS — I4819 Other persistent atrial fibrillation: Secondary | ICD-10-CM

## 2018-12-26 DIAGNOSIS — I495 Sick sinus syndrome: Secondary | ICD-10-CM

## 2018-12-26 LAB — CUP PACEART REMOTE DEVICE CHECK
Brady Statistic RV Percent Paced: 17 %
Implantable Lead Implant Date: 20140508
Implantable Lead Implant Date: 20140508
Implantable Lead Location: 753859
Implantable Lead Location: 753860
Implantable Lead Model: 4135
Implantable Lead Model: 4136
Implantable Lead Serial Number: 29379474
Lead Channel Impedance Value: 613 Ohm
Lead Channel Impedance Value: 675 Ohm
Lead Channel Pacing Threshold Amplitude: 0.9 V
Lead Channel Setting Pacing Amplitude: 2.4 V
Lead Channel Setting Pacing Pulse Width: 0.4 ms
MDC IDC LEAD SERIAL: 29333020
MDC IDC MSMT BATTERY REMAINING LONGEVITY: 84 mo
MDC IDC MSMT BATTERY REMAINING PERCENTAGE: 92 %
MDC IDC MSMT LEADCHNL RA PACING THRESHOLD PULSEWIDTH: 0.4 ms
MDC IDC PG IMPLANT DT: 20140508
MDC IDC SESS DTM: 20200302074100
MDC IDC SET LEADCHNL RA PACING AMPLITUDE: 2 V
MDC IDC SET LEADCHNL RV SENSING SENSITIVITY: 2.5 mV
MDC IDC STAT BRADY RA PERCENT PACED: 87 %
Pulse Gen Serial Number: 112392

## 2019-01-02 NOTE — Progress Notes (Signed)
Remote pacemaker transmission.   

## 2019-01-03 DIAGNOSIS — L570 Actinic keratosis: Secondary | ICD-10-CM | POA: Diagnosis not present

## 2019-01-03 DIAGNOSIS — Z85828 Personal history of other malignant neoplasm of skin: Secondary | ICD-10-CM | POA: Diagnosis not present

## 2019-01-03 DIAGNOSIS — C44719 Basal cell carcinoma of skin of left lower limb, including hip: Secondary | ICD-10-CM | POA: Diagnosis not present

## 2019-01-03 DIAGNOSIS — D485 Neoplasm of uncertain behavior of skin: Secondary | ICD-10-CM | POA: Diagnosis not present

## 2019-01-03 DIAGNOSIS — D0462 Carcinoma in situ of skin of left upper limb, including shoulder: Secondary | ICD-10-CM | POA: Diagnosis not present

## 2019-01-07 ENCOUNTER — Other Ambulatory Visit: Payer: Self-pay | Admitting: Cardiology

## 2019-01-18 ENCOUNTER — Telehealth: Payer: Self-pay

## 2019-01-18 NOTE — Telephone Encounter (Signed)
-----   Message from Consuelo Pandy, [place] sent at 01/18/2019 10:38 AM EDT ----- Regarding: virtual visit Chart reviewed. Pt is scheduled for 6 month f/u. Given her age and medical problems, I advise that she does not come into clinic for f/u if stable. Try can arrange Virtual visit w/ me. If unable to do video can do phone appointment.

## 2019-01-18 NOTE — Telephone Encounter (Signed)

## 2019-01-23 ENCOUNTER — Telehealth (INDEPENDENT_AMBULATORY_CARE_PROVIDER_SITE_OTHER): Payer: Medicare Other | Admitting: Cardiology

## 2019-01-23 ENCOUNTER — Other Ambulatory Visit: Payer: Self-pay

## 2019-01-23 ENCOUNTER — Telehealth: Payer: Self-pay

## 2019-01-23 ENCOUNTER — Ambulatory Visit (INDEPENDENT_AMBULATORY_CARE_PROVIDER_SITE_OTHER): Payer: Medicare Other | Admitting: General Practice

## 2019-01-23 ENCOUNTER — Telehealth: Payer: Self-pay | Admitting: Family Medicine

## 2019-01-23 VITALS — BP 102/76 | HR 70 | Ht 66.0 in | Wt 187.0 lb

## 2019-01-23 DIAGNOSIS — R5383 Other fatigue: Secondary | ICD-10-CM

## 2019-01-23 DIAGNOSIS — Z5181 Encounter for therapeutic drug level monitoring: Secondary | ICD-10-CM | POA: Diagnosis not present

## 2019-01-23 DIAGNOSIS — L299 Pruritus, unspecified: Secondary | ICD-10-CM | POA: Diagnosis not present

## 2019-01-23 DIAGNOSIS — I110 Hypertensive heart disease with heart failure: Secondary | ICD-10-CM | POA: Diagnosis not present

## 2019-01-23 DIAGNOSIS — I4819 Other persistent atrial fibrillation: Secondary | ICD-10-CM | POA: Diagnosis not present

## 2019-01-23 DIAGNOSIS — R0600 Dyspnea, unspecified: Secondary | ICD-10-CM

## 2019-01-23 DIAGNOSIS — I5042 Chronic combined systolic (congestive) and diastolic (congestive) heart failure: Secondary | ICD-10-CM | POA: Diagnosis not present

## 2019-01-23 LAB — POCT INR: INR: 3.8 — AB (ref 2.0–3.0)

## 2019-01-23 MED ORDER — MAGNESIUM 250 MG PO TABS: 250.0000 mg | ORAL_TABLET | Freq: Every day | ORAL | 3 refills | Status: DC

## 2019-01-23 NOTE — Telephone Encounter (Signed)
F/U          [name] is calling from [facility] to let us know that they DO NOT draw labs for other offices. Per [name] at [facility] she got a call from [name] needing to verify if  they have recv'd orders for the above patient. [name] also stated that she has communicated this SEVERAL TIMES and we are still sending/asking that they do this. Pls be advise that AGAIN they [email or web address]  [facility] DO NOT DRAW LABS FOR OTHER OFFICES!! Just and FYI.

## 2019-01-23 NOTE — Progress Notes (Signed)
Virtual Visit via Telephone Note    Evaluation Performed:  Follow-up visit  This visit type was conducted due to national recommendations for restrictions regarding the COVID-19 Pandemic (e.g. social distancing).  This format is felt to be most appropriate for this patient at this time.  All issues noted in this document were discussed and addressed.  No physical exam was performed (except for noted visual exam findings with Video Visits).  Please refer to the patient's chart (MyChart message for video visits and phone note for telephone visits) for the patient's consent to telehealth for [facility].  Date:  01/23/2019   ID:  Molly [name], DOB [birth year], MRN [id number]  Patient Location:  Home  Provider location:   [facility] Location   PCP:  Martinique, Betty G, MD  Cardiologist:  Fransico Him, MD  Electrophysiologist:  Constance Haw, MD   Chief Complaint:  F/u for Chronic Combined Systolic and Diastolic CHF and Atrial Fibrillation; New complaints of fatigue, low BP and pruritis.   History of Present Illness:    Molly [name] is a 85 y.o. female who presents via audio/video conferencing for a telehealth visit today.    The patient does not have symptoms concerning for COVID-19 infection (fever, chills, cough, or new shortness of breath).   Molly [name] is a 85 y.o. female with a hx of dilated cardiomyopathy with an EF of 40 to 45%, chronic combined systolic/diastolic CHF, hyperlipidemia, hypertension, tachybradycardia syndrome status post pacemaker implant 2014. Her afib has been difficult to control and she has been followed by EP. Also followed in the afib clinic. She is on AAD therapy w/ dofetilide and has required cardioversion's. Failed amiodarone and sotalol in the past. She is not felt to be a good candidate for afib ablation given her severely dilated LA.   She reports she has done fairly well. She has mild exertional  dyspnea, which seems to be fairly stable. She denies resting dyspnea, orthopnea and PND. She sleeps with only 1 pillow. Also denies LEE. Her weight has remained stable. Last OV, she was 190 lb. She is 187 lb today. She reports full med compliance but reports issues with low BP and fatigue. BP today is 102/76. Has been much lower in recent days, in the upper 75F-16B systolic but she denies syncope/ near syncope. She has been staying well hydrated. No recent fever, chills, n/v/d. She is on chronic coumadin but denies any signs of abnormal bleeding. She feels week w/ exertion. Last CBC from 02/2018 showed hgb at 11 (downward drift over several months, 13>>11). She also reports diffuse itching all over her body. Feels like an allergy.  She has seen a dermatologist and they do not suspect dermatitis. No changes in medications, detergents, new soaps or creams. Last BMP was 09/2018. SCr was 1.30.  She does report chest discomfort c/w indigestion. Occurs after meals. Difficulty swallowing. Improvement w/ PPI. No exertional component. Plans to f/u with her gastroenterologist.    Also, she can tell when she goes into afib. She is usually symptomatic w/ palpitations. She is currently w/o symptoms. She feels like she is currently in NSR. Pulse rate by home BP cuff is 70 bpm.    Prior CV studies:   The following studies were reviewed today:  2D Echo 12/07/2017 Study Conclusions  - Left ventricle: The cavity size was normal. There was mild focal   basal hypertrophy of the septum. Systolic function was mildly to   moderately  reduced. The estimated ejection fraction was in the   range of 40% to 45%. Mild diffuse hypokinesis. Features are   consistent with a pseudonormal left ventricular filling pattern,   with concomitant abnormal relaxation and increased filling   pressure (grade 2 diastolic dysfunction). - Aortic valve: Trileaflet; mildly thickened, mildly calcified   leaflets. - Mitral valve: Calcified  annulus. There was mild to moderate   regurgitation. - Left atrium: The atrium was severely dilated.   Past Medical History:  Diagnosis Date  . Aortic insufficiency    mild by echo 2017  . Cancer ([place])    Skin cancer- basal.  1 mylenoma  . Complication of anesthesia   . DCM (dilated cardiomyopathy) ([place])    EF 40-45% by echo 2017 - nuclear stress test with no ischemia  . GERD (gastroesophageal reflux disease)   . H/O benign essential tremor    on amiodarone resolved off amio  . H/O hyperkalemia    Resolved off ACE inhibitors  . High cholesterol   . History of blood transfusion   . Hyperparathyroidism ([place])   . Hypertension   . Mitral regurgitation    mild to moderate by echo 2017  . Persistent atrial fibrillation   . PONV (postoperative nausea and vomiting)   . PVC's (premature ventricular contractions)   . Renal insufficiency    MILD  . Shortness of breath    with exertion  . Tachycardia-bradycardia syndrome [facility])    s/p PPM 02/2013   Past Surgical History:  Procedure Laterality Date  . AMPUTATION Right 11/27/2014   Procedure: RIGHT 2ND AND 3RD TOE AMPUTATION;  Surgeon: Wylene Simmer, MD;  Location: [facility];  Service: Orthopedics;  Laterality: Right;  . BUNIONECTOMY     Right  . CARDIAC CATHETERIZATION  09/25/2000   EF of 50% -- with normal left ventricular size and function  . CARDIOVERSION N/A 12/28/2017   Procedure: CARDIOVERSION;  Surgeon: Dorothy Spark, MD;  Location: [facility] ENDOSCOPY;  Service: Cardiovascular;  Laterality: N/A;  . CARDIOVERSION N/A 02/25/2018   Procedure: CARDIOVERSION;  Surgeon: Skeet Latch, MD;  Location: [place];  Service: Cardiovascular;  Laterality: N/A;  . CESAREAN SECTION    . CESAREAN SECTION    . EYE SURGERY Bilateral    Cataract  . FOOT SURGERY    . INSERT / REPLACE / REMOVE PACEMAKER  02/2013  . PACEMAKER INSERTION  02/2013  . PERMANENT PACEMAKER INSERTION N/A 03/02/2013   Procedure: PERMANENT PACEMAKER INSERTION;  Surgeon: Evans Lance, MD;  Location: [facility] CATH LAB;  Service: Cardiovascular;  Laterality: N/A;  . SHOULDER ARTHROSCOPY W/ ROTATOR CUFF REPAIR    . SHOULDER SURGERY Right    roto cuff  . TOE AMPUTATION Left    2nd and 3rd, bunion under toes.  . TONSILLECTOMY    . VARICOSE VEIN SURGERY       Current Meds  Medication Sig  . acetaminophen (TYLENOL) 500 MG tablet Take 1,000 mg by mouth every 6 (six) hours as needed. For pain  . carvedilol (COREG) 3.125 MG tablet TAKE 1 TABLET TWICE A DAY WITH A MEAL  . dofetilide (TIKOSYN) 250 MCG capsule TAKE ONE CAPSULE (250MG  TOTAL) BY MOUTH TWO TIMES DAILY  . dorzolamide (TRUSOPT) 2 % ophthalmic solution Place 1 drop into the right eye 2 (two) times daily.  Marland Kitchen LORazepam (ATIVAN) 0.5 MG tablet Take 1 tablet (0.5 mg total) by mouth at bedtime as needed.  Marland Kitchen losartan (COZAAR) 25 MG tablet Take 0.5 tablets (12.5 mg  total) by mouth daily.  . Multiple Vitamin (MULTIVITAMIN WITH MINERALS) TABS Take 1 tablet by mouth daily.  . simvastatin (ZOCOR) 20 MG tablet Take 20 mg by mouth daily at 6 PM.   . spironolactone (ALDACTONE) 25 MG tablet Take 0.5 tablets (12.5 mg total) by mouth daily.  . Triamcinolone Acetonide (TRIAMCINOLONE 0.1 % CREAM : EUCERIN) CREA Apply 1 application topically 2 (two) times daily as needed for rash or itching.  . warfarin (COUMADIN) 5 MG tablet TAKE 1/2 TO 1 TABLET DAILY AS DIRECTED BY ANTICOAGULATION CLINIC     Allergies:   Codeine and Tramadol   Social History   Tobacco Use  . Smoking status: Never Smoker  . Smokeless tobacco: Never Used  Substance Use Topics  . Alcohol use: No  . Drug use: No     Family Hx: The patient's family history includes Hypertension in her father and mother.  ROS:   Please see the history of present illness.     All other systems reviewed and are negative.   Labs/Other Tests and Data Reviewed:    Recent Labs: 02/24/2018: Hemoglobin 11.2; Platelets 216 09/26/2018: TSH 3.750 10/10/2018: BUN 31; Creatinine, Ser  1.30; Potassium 5.2; Sodium 137 11/07/2018: Magnesium 1.7   Recent Lipid Panel Lab Results  Component Value Date/Time   CHOL 156 05/23/2012 05:14 AM   TRIG 86 05/23/2012 05:14 AM   HDL 80 05/23/2012 05:14 AM   CHOLHDL 2.0 05/23/2012 05:14 AM   LDLCALC 59 05/23/2012 05:14 AM    Wt Readings from Last 3 Encounters:  01/23/19 187 lb (84.8 kg)  11/30/18 189 lb 8 oz (86 kg)  09/26/18 190 lb 12.8 oz (86.5 kg)     Exam:    Vital Signs:  BP 102/76   Pulse 70   Ht 5\' 6"  (1.676 m)   Wt 187 lb (84.8 kg)   BMI 30.18 kg/m    Well sounding female in no acute distress. Speaking in clear sentences. Speech unlabored.   ASSESSMENT & PLAN:    1. Dilated Cardiomyopathy/ Chronic Combined Systolic and Diastolic CHF: echo in 2229 showed mildly reduced EF 40% to 45%. Mild diffuse hypokinesis. She is on medical therapy for CHF, including Coreg, Losartan and spironolactone.  Given notable fatigue, decreased exercise tolerance and low BP, will adjust meds. She is already on the lowest dose of Coreg, Losartan and spironolactone. I don't think she would tolerate Entresto. She needs coreg to help with rate control given her afib that has been difficult to control, and would benefit from continuation of a diuretic to help with volume control. For this reason, we will stop Losartan and continue low dose coreg and low dose spironolactone. Will f/u with RN call in 1 week to reassess BP and symptom response. Not currently on lasix but her weight appears stable w/ spironolactone. Last office weight when seen by EP 09/26/18 was 190 lb. Weight today by home scale is 187 lb. She has had some exertional dyspnea. Will check a BNP today as well as BMP.     2. Atrial Fibrillation: Has been difficulty to control and now followed in the Atrial fibrillation clinic. Failed amiodarone and sotalol in the past. Currently on AAD therapy w/ dofetilide and  blocker therapy w/ coreg for rate control and chronic systolic CHF. Has  required cardioversions. Not felt to be a good candidate for afib ablation due to severely dilated LA. She can tell when she is in atrial fibrillation, usually symptomatic with palpitations, but she feels  that she is currently in NSR. HR controlled in the 70s. On coumadin for a/c. INRs followed by PCP. Scheduled for coumadin check today. PCP also regularly checks BMP and Mg given pt is on Dofetilide. Labs are in [place]. PCP prescribes magnesium oxide.   3. Tachy-Brady Syndrome: s/p PPM, followed by EP. [facility].   4. Nonrheumatic MR: mild to moderate MR noted on last echo 11/2017.   7. Essential HTN:   BP has been low at home, in the 37-85Y systolic and currently 850/27. She denies syncope/ near syncope but feels poorly when BP is low. Significant fatigue, exertional dyspnea and decreased exercise tolerance. As noted above, we will stop losartan. Continue low dose coreg for rate control for afib and spironolactone for control of fluid given chronic combined systolic and diastolic CHF. Will f/u with RN phone call in 1 week to recheck BP and symptoms.   8. Fatigue/ Decreased Exercise Tolerance/ Soft BP: symptoms may be due to CHF. Will check BNP to assess volume status. Will stop Losartan for now. Given pt is on coumadin and was anemic in 02/2018 with Hgb at 11, will recheck CBC. This can be done when pt goes to PCP today for already scheduled coumadin visit. Check BMP as well.   9. Diffuse Pruritis: ongoing for several months with no identifiable cause. Seen by dermatology and not felt to be dermatitis. Will order BMP to assess renal function.    COVID-19 Education: The signs and symptoms of COVID-19 were discussed with the patient and how to seek care for testing (follow up with PCP or arrange E-visit).  The importance of social distancing was discussed today.  Patient Risk:   After full review of this patients clinical status, I feel that they are at least moderate risk at this time.  Time:    Today, I have spent 25 minutes with the patient with telehealth technology discussing medical problems outlined above, including treatment plan and additional evaluation given symptoms outlined above.    Medication Adjustments/Labs and Tests Ordered: Current medicines are reviewed at length with the patient today.  Concerns regarding medicines are outlined above.   1. Stop Losartan. 2. Continue all other meds as prescribed.  3. Order BMP, BNP and CBC for dyspnea 4. Order BMP for pruritis 5. Heart Failure Home instructions: check weight daily. Call our office if > 3 lb weight gain in 24 hrs of > 5 lb weight gain in 1 week.  6. Low sodium diet 7. Pt needs f/u RN phone call in 1 week to recheck BP after stopping losartan and reassess symptom response. 8. F/u with Dr. Radford Pax in 6 months or sooner if needed (call if any worsening/ new cardiac symptoms.    Tests Ordered: No orders of the defined types were placed in this encounter. See above  Medication Changes: Meds ordered this encounter  Medications  . Magnesium 250 MG TABS    Sig: Take 1 tablet (250 mg total) by mouth daily.    Dispense:  90 tablet    Refill:  3    Disposition:  Follow up 6 months w/ Dr. Radford Pax    Signed, Lyda Jester, PA-C  01/23/2019 10:59 AM    [place]

## 2019-01-23 NOTE — Telephone Encounter (Signed)
Spoke to the patient with Brittainy's recommendations post Virtual Visit (Phone).  She verbalized understanding and I will call her on Monday 4/6 to check BP.

## 2019-01-23 NOTE — Telephone Encounter (Signed)
Lpm prepping her for telephone visit with Brittainy today at 11 am.

## 2019-01-23 NOTE — Patient Instructions (Signed)
Medication Instructions:  STOP LOSARTAN If you need a refill on your cardiac medications before your next appointment, please call your pharmacy.   Lab work:TODAY 3/30 @ PCP BNP BMP CBC If you have labs (blood work) drawn today and your tests are completely normal, you will receive your results only by: Marland Kitchen MyChart Message (if you have MyChart) OR . A paper copy in the mail If you have any lab test that is abnormal or we need to change your treatment, we will call you to review the results.  Testing/Procedures: NONE  Follow-Up: Dr Radford Pax in [place], you and your health needs are our priority.  As part of our continuing mission to provide you with exceptional heart care, we have created designated Provider Care Teams.  These Care Teams include your primary Cardiologist (physician) and Advanced Practice Providers (APPs -  Physician Assistants and Nurse Practitioners) who all work together to provide you with the care you need, when you need it.  Any Other Special Instructions Will Be Listed Below (If Applicable). WEIGHT:  If you gain more than 3 lbs in 1 DAY or 5 lbs in 1 WEEK, CALL OUR OFFICE.   DASH Eating Plan DASH stands for "Dietary Approaches to Stop Hypertension." The DASH eating plan is a healthy eating plan that has been shown to reduce high blood pressure (hypertension). It may also reduce your risk for type 2 diabetes, heart disease, and stroke. The DASH eating plan may also help with weight loss. What are tips for following this plan?  General guidelines  Avoid eating more than 2,300 mg (milligrams) of salt (sodium) a day. If you have hypertension, you may need to reduce your sodium intake to 1,500 mg a day.  Limit alcohol intake to no more than 1 drink a day for nonpregnant women and 2 drinks a day for men. One drink equals 12 oz of beer, 5 oz of wine, or 1 oz of hard liquor.  Work with your health care provider to maintain a healthy body weight or to  lose weight. Ask what an ideal weight is for you.  Get at least 30 minutes of exercise that causes your heart to beat faster (aerobic exercise) most days of the week. Activities may include walking, swimming, or biking.  Work with your health care provider or diet and nutrition specialist (dietitian) to adjust your eating plan to your individual calorie needs. Reading food labels   Check food labels for the amount of sodium per serving. Choose foods with less than 5 percent of the Daily Value of sodium. Generally, foods with less than 300 mg of sodium per serving fit into this eating plan.  To find whole grains, look for the word "whole" as the first word in the ingredient list. Shopping  Buy products labeled as "low-sodium" or "no salt added."  Buy fresh foods. Avoid canned foods and premade or frozen meals. Cooking  Avoid adding salt when cooking. Use salt-free seasonings or herbs instead of table salt or sea salt. Check with your health care provider or pharmacist before using salt substitutes.  Do not fry foods. Cook foods using healthy methods such as baking, boiling, grilling, and broiling instead.  Cook with heart-healthy oils, such as olive, canola, soybean, or sunflower oil. Meal planning  Eat a balanced diet that includes: ? 5 or more servings of fruits and vegetables each day. At each meal, try to fill half of your plate with fruits and vegetables. ? Up to  6-8 servings of whole grains each day. ? Less than 6 oz of lean meat, poultry, or fish each day. A 3-oz serving of meat is about the same size as a deck of cards. One egg equals 1 oz. ? 2 servings of low-fat dairy each day. ? A serving of nuts, seeds, or beans 5 times each week. ? Heart-healthy fats. Healthy fats called Omega-3 fatty acids are found in foods such as flaxseeds and coldwater fish, like sardines, salmon, and mackerel.  Limit how much you eat of the following: ? Canned or prepackaged foods. ? Food that is  high in trans fat, such as fried foods. ? Food that is high in saturated fat, such as fatty meat. ? Sweets, desserts, sugary drinks, and other foods with added sugar. ? Full-fat dairy products.  Do not salt foods before eating.  Try to eat at least 2 vegetarian meals each week.  Eat more home-cooked food and less restaurant, buffet, and fast food.  When eating at a restaurant, ask that your food be prepared with less salt or no salt, if possible. What foods are recommended? The items listed may not be a complete list. Talk with your dietitian about what dietary choices are best for you. Grains Whole-grain or whole-wheat bread. Whole-grain or whole-wheat pasta. Brown rice. [place]. Bulgur. Whole-grain and low-sodium cereals. Pita bread. Low-fat, low-sodium crackers. Whole-wheat flour tortillas. Vegetables Fresh or frozen vegetables (raw, steamed, roasted, or grilled). Low-sodium or reduced-sodium tomato and vegetable juice. Low-sodium or reduced-sodium tomato sauce and tomato paste. Low-sodium or reduced-sodium canned vegetables. Fruits All fresh, dried, or frozen fruit. Canned fruit in natural juice (without added sugar). Meat and other protein foods Skinless chicken or [place]. Ground chicken or [place]. Pork with fat trimmed off. Fish and seafood. Egg whites. Dried beans, peas, or lentils. Unsalted nuts, nut butters, and seeds. Unsalted canned beans. Lean cuts of beef with fat trimmed off. Low-sodium, lean deli meat. Dairy Low-fat (1%) or fat-free (skim) milk. Fat-free, low-fat, or reduced-fat cheeses. Nonfat, low-sodium ricotta or cottage cheese. Low-fat or nonfat yogurt. Low-fat, low-sodium cheese. Fats and oils Soft margarine without trans fats. Vegetable oil. Low-fat, reduced-fat, or light mayonnaise and salad dressings (reduced-sodium). Canola, safflower, olive, soybean, and sunflower oils. Avocado. Seasoning and other foods Herbs. Spices. Seasoning mixes without salt.  Unsalted popcorn and pretzels. Fat-free sweets. What foods are not recommended? The items listed may not be a complete list. Talk with your dietitian about what dietary choices are best for you. Grains Baked goods made with fat, such as croissants, muffins, or some breads. Dry pasta or rice meal packs. Vegetables Creamed or fried vegetables. Vegetables in a cheese sauce. Regular canned vegetables (not low-sodium or reduced-sodium). Regular canned tomato sauce and paste (not low-sodium or reduced-sodium). Regular tomato and vegetable juice (not low-sodium or reduced-sodium). Angie Fava. Olives. Fruits Canned fruit in a light or heavy syrup. Fried fruit. Fruit in cream or butter sauce. Meat and other protein foods Fatty cuts of meat. Ribs. Fried meat. Berniece Salines. Sausage. Bologna and other processed lunch meats. Salami. Fatback. Hotdogs. Bratwurst. Salted nuts and seeds. Canned beans with added salt. Canned or smoked fish. Whole eggs or egg yolks. Chicken or [place] with skin. Dairy Whole or 2% milk, cream, and half-and-half. Whole or full-fat cream cheese. Whole-fat or sweetened yogurt. Full-fat cheese. Nondairy creamers. Whipped toppings. Processed cheese and cheese spreads. Fats and oils Butter. Stick margarine. Lard. Shortening. Ghee. Bacon fat. Tropical oils, such as coconut, palm kernel, or palm oil. Seasoning and  other foods Salted popcorn and pretzels. Onion salt, garlic salt, seasoned salt, table salt, and sea salt. Worcestershire sauce. Tartar sauce. Barbecue sauce. Teriyaki sauce. Soy sauce, including reduced-sodium. Steak sauce. Canned and packaged gravies. Fish sauce. Oyster sauce. Cocktail sauce. Horseradish that you find on the shelf. Ketchup. Mustard. Meat flavorings and tenderizers. Bouillon cubes. Hot sauce and Tabasco sauce. Premade or packaged marinades. Premade or packaged taco seasonings. Relishes. Regular salad dressings. Where to find more information:  National Heart, Lung, and [place]: https://wilson-eaton.com/  American Heart Association: www.heart.org Summary  The DASH eating plan is a healthy eating plan that has been shown to reduce high blood pressure (hypertension). It may also reduce your risk for type 2 diabetes, heart disease, and stroke.  With the DASH eating plan, you should limit salt (sodium) intake to 2,300 mg a day. If you have hypertension, you may need to reduce your sodium intake to 1,500 mg a day.  When on the DASH eating plan, aim to eat more fresh fruits and vegetables, whole grains, lean proteins, low-fat dairy, and heart-healthy fats.  Work with your health care provider or diet and nutrition specialist (dietitian) to adjust your eating plan to your individual calorie needs. This information is not intended to replace advice given to you by your health care provider. Make sure you discuss any questions you have with your health care provider. Document Released: 10/01/2011 Document Revised: 10/05/2016 Document Reviewed: 10/05/2016 Elsevier Interactive Patient Education  2019 [employer].

## 2019-01-23 NOTE — Telephone Encounter (Signed)
Copied from [place] [id number]. Topic: Quick Communication - See Telephone Encounter >> Jan 23, 2019 11:51 AM Blase Mess A wrote: CRM for notification. See Telephone encounter for: 01/23/19. Micheal is calling from [facility] to verify orders are received BMP, BNP, [facility] Please advise CB- [phone]

## 2019-01-23 NOTE — Telephone Encounter (Signed)
Per policy we do not draw labs for other providers/offices unless [facility].   LM for [name] to advise.

## 2019-01-23 NOTE — Patient Instructions (Addendum)
Pre visit review using our clinic review tool, if applicable. No additional management support is needed unless otherwise documented below in the visit note.  Hold dosage today and tomorrow and then continue taking same dosage 1/2 tablet everyday except 1 tablet on Mondays, Wednesdays and Fridays.   Recheck in 3 to 4 weeks.

## 2019-01-24 ENCOUNTER — Other Ambulatory Visit: Payer: Medicare Other

## 2019-01-24 ENCOUNTER — Other Ambulatory Visit: Payer: Self-pay

## 2019-01-24 DIAGNOSIS — R0600 Dyspnea, unspecified: Secondary | ICD-10-CM

## 2019-01-24 DIAGNOSIS — L299 Pruritus, unspecified: Secondary | ICD-10-CM

## 2019-01-25 ENCOUNTER — Telehealth: Payer: Self-pay

## 2019-01-25 LAB — BASIC METABOLIC PANEL
BUN/Creatinine Ratio: 31 — ABNORMAL HIGH (ref 12–28)
BUN: 32 mg/dL — ABNORMAL HIGH (ref 8–27)
CALCIUM: 10.7 mg/dL — AB (ref 8.7–10.3)
CHLORIDE: 103 mmol/L (ref 96–106)
CO2: 20 mmol/L (ref 20–29)
Creatinine, Ser: 1.04 mg/dL — ABNORMAL HIGH (ref 0.57–1.00)
GFR calc non Af Amer: 50 mL/min/{1.73_m2} — ABNORMAL LOW (ref >59)
GFR, EST AFRICAN AMERICAN: 57 mL/min/{1.73_m2} — AB (ref >59)
Glucose: 126 mg/dL — ABNORMAL HIGH (ref 65–99)
Potassium: 5.4 mmol/L — ABNORMAL HIGH (ref 3.5–5.2)
Sodium: 138 mmol/L (ref 134–144)

## 2019-01-25 LAB — CBC
HEMOGLOBIN: 10.4 g/dL — AB (ref 11.1–15.9)
Hematocrit: 31.8 % — ABNORMAL LOW (ref 34.0–46.6)
MCH: 32.3 pg (ref 26.6–33.0)
MCHC: 32.7 g/dL (ref 31.5–35.7)
MCV: 99 fL — ABNORMAL HIGH (ref 79–97)
Platelets: 248 10*3/uL (ref 150–450)
RBC: 3.22 x10E6/uL — AB (ref 3.77–5.28)
RDW: 13.4 % (ref 11.7–15.4)
WBC: 7.6 10*3/uL (ref 3.4–10.8)

## 2019-01-25 LAB — PRO B NATRIURETIC PEPTIDE: NT-Pro BNP: 443 pg/mL (ref 0–738)

## 2019-01-25 NOTE — Telephone Encounter (Signed)
Notes recorded by Frederik Schmidt, RN on 01/25/2019 at 1:59 PM EDT The patient has been notified of the result and verbalized understanding. All questions (if any) were answered. Frederik Schmidt, RN 01/25/2019 1:59 PM   ------

## 2019-01-25 NOTE — Telephone Encounter (Signed)
-----   Message from Molly [name], [place] sent at 01/25/2019  1:37 PM EDT ----- BNP (Fluid marker) is normal. This is good and means that recent SOB is not due to acute CHF (fluid in lungs). Her kidney function is stable. Potassium is borderline high at 5.4 but she was given instruction to stop Losartan yesterday so this should help resolve hyperkalemia. She is mildly anemic. Hgb is down slightly from 11.2 >>10.4. This can cause some fatigue and tiredness. Given she is on coumadin, she should notify PCP and have them follow. She may need an anemia w/u if Hgb continues to trend downward. She should notify PCP if she notices any worsening symptoms, melena (dark, black, tarry stools), hematochezia (bright red blood w/ bowel movements) or any other abnormal bleeding. No abnormalities in labs to explain her recent itching.

## 2019-01-26 ENCOUNTER — Telehealth: Payer: Self-pay | Admitting: Family Medicine

## 2019-01-26 NOTE — Telephone Encounter (Signed)
Message sent to Dr. Martinique for review and approval. Patient has ov scheduled for 02/20/2019.

## 2019-01-26 NOTE — Telephone Encounter (Signed)
Patient has a rash on arms and legs. The cream that was prescribed to her is not working anymore. She wants to know if the is something that Dr. Martinique can prescribe for her to take orally. She said that she has tried cutting out certain foods and don't use anything with scents in it.   Please Advise

## 2019-01-27 ENCOUNTER — Other Ambulatory Visit: Payer: Self-pay | Admitting: *Deleted

## 2019-01-27 DIAGNOSIS — R21 Rash and other nonspecific skin eruption: Secondary | ICD-10-CM

## 2019-01-27 NOTE — Telephone Encounter (Signed)
Dermatology referral can be placed. Thanks, BJ

## 2019-01-27 NOTE — Telephone Encounter (Signed)
Referral placed for dermatology

## 2019-01-30 ENCOUNTER — Telehealth: Payer: Self-pay

## 2019-01-30 NOTE — Telephone Encounter (Signed)
Pt states she would like to know if there is something she can take orally for itching.   Pt states she has taken allergy pills with no relief. Please advise.

## 2019-01-30 NOTE — Telephone Encounter (Signed)
Spoke to patient

## 2019-01-30 NOTE — Telephone Encounter (Signed)
I followed up with the patient after stopping Losartan 1 week ago (Monday).  Her BP on Sunday 4/5 was 118/77 and this morning 4/6 was 115/82 with HR of 76.  I told her that she should continue to monitor occasionally and keep [place] updated, if changes.  She verbalized understanding.

## 2019-01-30 NOTE — Telephone Encounter (Signed)
Ok great. Glad her BP is better. It was low last week. Nothing further to add.

## 2019-01-31 NOTE — Telephone Encounter (Signed)
Last OV I did not appreciate skin lesions,thought pruritus possibly related to dry skin. Has she noted skin rash? Oral Prednisone sometimes prescribed to treat skin pruritic rash, I do not think it will help just to treat just pruritus and not indicated. Also Prednisone can cause side effects and interact with some of her medications.  We need labs done, CMP, to evaluate for other possible causes of skin pruritus.  Thanks, BJ

## 2019-01-31 NOTE — Telephone Encounter (Signed)
Spoke with patient and gave recommendations per Dr. Martinique. Patient verbalized understanding. Patient stated that she had some labs done last week with Cardio, but it wasn't a CMP. Patient stated that rash happens off and on and has used cream and allergy medicine that was suggested by PCP, rash is causing her to have trouble sleeping at night. Patient offered virtual visit for tomorrow, but does not have access to laptop or computer, patient stated that she would need a telephone visit. Please advise.

## 2019-01-31 NOTE — Telephone Encounter (Signed)
Message sent to Dr. Jordan for review and approval. 

## 2019-02-07 DIAGNOSIS — L282 Other prurigo: Secondary | ICD-10-CM | POA: Diagnosis not present

## 2019-02-07 DIAGNOSIS — Z85828 Personal history of other malignant neoplasm of skin: Secondary | ICD-10-CM | POA: Diagnosis not present

## 2019-02-07 DIAGNOSIS — L509 Urticaria, unspecified: Secondary | ICD-10-CM | POA: Diagnosis not present

## 2019-02-07 DIAGNOSIS — R21 Rash and other nonspecific skin eruption: Secondary | ICD-10-CM | POA: Diagnosis not present

## 2019-02-07 NOTE — Telephone Encounter (Signed)
Could we arrange a f/u virtual visit. Thanks, BJ

## 2019-02-07 NOTE — Telephone Encounter (Signed)
Spoke with patient and she stated that she has an appointment this morning with dermatologist this morning and if needed she would call office to schedule virtual visit. Nothing further needed at this time.

## 2019-02-08 ENCOUNTER — Telehealth: Payer: Self-pay | Admitting: Cardiology

## 2019-02-08 NOTE — Telephone Encounter (Signed)
Patient wants to know if it ok to take OTC benadryl, and prescription of  hydroxyzne 10 mg since she is on coumadin and heart medication.

## 2019-02-08 NOTE — Telephone Encounter (Signed)
Returned call to patient.  Advised we would prefer benadryl as she is on Tikosyn.  Hydroxyzine can potentially prolong QT interval.  She said dermatologist also recommended that she may need a pulse of steroids, but they were both concerned about her being on warfarin.    Explained that if steroid use is needed, she should go ahead and take it, but be sure to let MD monitoring warfarin know, as we recommend INR check after 3-4 days on steroid.  Patient voiced understanding, will try benadryl for a few days and if no help will contact dermatologist about possible steroid pulse.

## 2019-02-09 ENCOUNTER — Encounter: Payer: Self-pay | Admitting: Internal Medicine

## 2019-02-09 NOTE — Progress Notes (Signed)
[facility] alert received for ongoing AF with periods of RVR since 02/07/19. Will route to AF clinic to have patient seen virtually to assess for symptoms.   Chanetta Marshall, NP 02/09/2019 7:53 AM

## 2019-02-09 NOTE — Progress Notes (Signed)
Patient with virtual visit tomorrow at 11am. Pt having a lot of issues with a rash and her son is having major surgery as we speak so pt is very anxious regarding this. HR has ranged from 70-140s.

## 2019-02-10 ENCOUNTER — Other Ambulatory Visit: Payer: Self-pay

## 2019-02-10 ENCOUNTER — Ambulatory Visit (HOSPITAL_COMMUNITY)
Admission: RE | Admit: 2019-02-10 | Discharge: 2019-02-10 | Disposition: A | Discharge status: Home / Self Care | Payer: Medicare Other | Source: Ambulatory Visit | Attending: Nurse Practitioner | Admitting: Nurse Practitioner

## 2019-02-10 ENCOUNTER — Encounter (HOSPITAL_COMMUNITY): Payer: Self-pay | Admitting: Nurse Practitioner

## 2019-02-10 ENCOUNTER — Encounter: Payer: Self-pay | Admitting: Family Medicine

## 2019-02-10 ENCOUNTER — Ambulatory Visit (INDEPENDENT_AMBULATORY_CARE_PROVIDER_SITE_OTHER): Payer: Medicare Other | Admitting: Family Medicine

## 2019-02-10 ENCOUNTER — Telehealth: Payer: Self-pay | Admitting: *Deleted

## 2019-02-10 VITALS — BP 111/71

## 2019-02-10 VITALS — BP 127/86 | HR 74 | Ht 66.0 in | Wt 177.0 lb

## 2019-02-10 DIAGNOSIS — I4819 Other persistent atrial fibrillation: Secondary | ICD-10-CM | POA: Diagnosis not present

## 2019-02-10 DIAGNOSIS — L509 Urticaria, unspecified: Secondary | ICD-10-CM

## 2019-02-10 DIAGNOSIS — L299 Pruritus, unspecified: Secondary | ICD-10-CM

## 2019-02-10 MED ORDER — FAMOTIDINE 20 MG PO TABS: 20.0000 mg | ORAL_TABLET | Freq: Two times a day (BID) | ORAL | 0 refills | Status: DC

## 2019-02-10 MED ORDER — PREDNISONE 20 MG PO TABS: ORAL_TABLET | ORAL | 0 refills | Status: DC

## 2019-02-10 NOTE — Telephone Encounter (Signed)
Patient was seen by dermatologist, patient also seen at [facility]. Patient trying to get steroid from PCP.

## 2019-02-10 NOTE — Telephone Encounter (Signed)
Message sent to Dr.Jordan. 

## 2019-02-10 NOTE — Telephone Encounter (Signed)
Roderic Palau, NP at [facility] states that she needs to speak with Dr. Martinique because Dr. Martinique needs to RX steroids for pt since this is outside of her scope of practice.

## 2019-02-10 NOTE — Telephone Encounter (Signed)
Please reviewed tel encounter 01/26/19, it seem like she was already evaluated by dermatologist.  Thanks, BJ

## 2019-02-10 NOTE — Telephone Encounter (Signed)
Patient called office stating that she need an steroid for the itching per the [facility] clinic. All of the scratching that she is doing is causing her mess up her heart rhythm. Patient need something today. Please advise.

## 2019-02-10 NOTE — Progress Notes (Signed)
Electrophysiology TeleHealth Note   Due to national recommendations of social distancing due to [place] 19, Audio/video  telehealth visit is felt to be most appropriate for this patient at this time.  See MyChart message/consent below  from today for patient consent regarding telehealth for the At[facility] Fibrillation Clinic.    Date:  02/10/2019   ID:  Molly [name], DOB 1933/08/22, MRN [id number]  Location: home   Provider location: [address] [place], [place] [address] Evaluation Performed:  Follow up per device clinic for   PCP:  Martinique, Betty G, MD  Primary Cardiologist:   Dr. Radford Pax Primary Electrophysiologist: Dr. [name]  IR:[id number] afib/rash   History of Present Illness: Molly [name] is a 85 y.o. female who presents via audio/video conferencing for a telehealth visit today.   The patient is referred for f/u per device clinic for paceart showing afib since 4/14. Pt reports that she has had a rash since the first of the week that has been driving her crazy with itching. No new meds. She saw a dermatologist on Tuesday and she was given a cream and told it would be ok to take benadryl as Zyrtec had not been helping. She was told if this approach did not work she was to discuss with PCP re steroid taper. Her HR this am is 74 bpm with a BP of 111/72.   Today, she denies symptoms of palpitations, chest pain, shortness of breath, orthopnea, PND, lower extremity edema, claudication, dizziness, presyncope, syncope, bleeding, or neurologic sequela. + for itching. + for fatigue.The patient is tolerating medications without difficulties and is otherwise without complaint today.   she denies symptoms of cough, fevers, chills, or new SOB worrisome for COVID 19.   Atrial Fibrillation Risk Factors:  she does not have symptoms or diagnosis of sleep apnea. she does not have a history of rheumatic fever. she does not have a history of alcohol use. The patient does not have a history  of early familial atrial fibrillation or other arrhythmias.  she has a BMI of Body mass index is 28.57 kg/m.Marland Kitchen Filed Weights   02/10/19 1042  Weight: 80.3 kg    Past Medical History:  Diagnosis Date   Aortic insufficiency    mild by echo 2017   Cancer ([place])    Skin cancer- basal.  1 mylenoma   Complication of anesthesia    DCM (dilated cardiomyopathy) ([place])    EF 40-45% by echo 2017 - nuclear stress test with no ischemia   GERD (gastroesophageal reflux disease)    H/O benign essential tremor    on amiodarone resolved off amio   H/O hyperkalemia    Resolved off ACE inhibitors   High cholesterol    History of blood transfusion    Hyperparathyroidism ([place])    Hypertension    Mitral regurgitation    mild to moderate by echo 2017   Persistent atrial fibrillation    PONV (postoperative nausea and vomiting)    PVC's (premature ventricular contractions)    Renal insufficiency    MILD   Shortness of breath    with exertion   Tachycardia-bradycardia syndrome ([place])    s/p PPM 02/2013   Past Surgical History:  Procedure Laterality Date   AMPUTATION Right 11/27/2014   Procedure: RIGHT 2ND AND 3RD TOE AMPUTATION;  Surgeon: Wylene Simmer, MD;  Location: [facility];  Service: Orthopedics;  Laterality: Right;   BUNIONECTOMY     Right   CARDIAC CATHETERIZATION  09/25/2000  EF of 50% -- with normal left ventricular size and function   CARDIOVERSION N/A 12/28/2017   Procedure: CARDIOVERSION;  Surgeon: Dorothy Spark, MD;  Location: [facility] ENDOSCOPY;  Service: Cardiovascular;  Laterality: N/A;   CARDIOVERSION N/A 02/25/2018   Procedure: CARDIOVERSION;  Surgeon: Skeet Latch, MD;  Location: [facility];  Service: Cardiovascular;  Laterality: N/A;   [place] Bilateral    Cataract   FOOT SURGERY     INSERT / REPLACE / REMOVE PACEMAKER  02/2013   PACEMAKER INSERTION  02/2013   PERMANENT PACEMAKER INSERTION N/A 03/02/2013     Procedure: PERMANENT PACEMAKER INSERTION;  Surgeon: Evans Lance, MD;  Location: [facility] CATH LAB;  Service: Cardiovascular;  Laterality: N/A;   SHOULDER ARTHROSCOPY W/ ROTATOR CUFF REPAIR     SHOULDER SURGERY Right    roto cuff   TOE AMPUTATION Left    2nd and 3rd, bunion under toes.   TONSILLECTOMY     VARICOSE VEIN SURGERY       Current Outpatient Medications  Medication Sig Dispense Refill   acetaminophen (TYLENOL) 500 MG tablet Take 1,000 mg by mouth every 6 (six) hours as needed. For pain     carvedilol (COREG) 3.125 MG tablet TAKE 1 TABLET TWICE A DAY WITH A MEAL 180 tablet 3   diphenhydrAMINE (BENADRYL) 25 MG tablet Take 12.5 mg by mouth 2 (two) times daily.     dofetilide (TIKOSYN) 250 MCG capsule TAKE ONE CAPSULE (250MG  TOTAL) BY MOUTH TWO TIMES DAILY 180 capsule 3   dorzolamide (TRUSOPT) 2 % ophthalmic solution Place 1 drop into the right eye 2 (two) times daily.     LORazepam (ATIVAN) 0.5 MG tablet Take 1 tablet (0.5 mg total) by mouth at bedtime as needed. 30 tablet 2   Magnesium 250 MG TABS Take 1 tablet (250 mg total) by mouth daily. 90 tablet 3   simvastatin (ZOCOR) 20 MG tablet Take 20 mg by mouth daily at 6 PM.      spironolactone (ALDACTONE) 25 MG tablet Take 0.5 tablets (12.5 mg total) by mouth daily. 45 tablet 3   Triamcinolone Acetonide (TRIAMCINOLONE 0.1 % CREAM : EUCERIN) CREA Apply 1 application topically 2 (two) times daily as needed for rash or itching. 500 each 1   warfarin (COUMADIN) 5 MG tablet TAKE 1/2 TO 1 TABLET DAILY AS DIRECTED BY ANTICOAGULATION CLINIC 90 tablet 1   Multiple Vitamin (MULTIVITAMIN WITH MINERALS) TABS Take 1 tablet by mouth daily.     No current facility-administered medications for this encounter.     Allergies:   Codeine and Tramadol   Social History:  The patient  reports that she has never smoked. She has never used smokeless tobacco. She reports that she does not drink alcohol or use drugs.   Family History:   The patient's  family history includes Hypertension in her father and mother.    ROS:  Please see the history of present illness.   All other systems are personally reviewed and negative.   Exam: NA  Recent Labs: 09/26/2018: TSH 3.750 11/07/2018: Magnesium 1.7 01/24/2019: BUN 32; Creatinine, Ser 1.04; Hemoglobin 10.4; NT-Pro BNP 443; Platelets 248; Potassium 5.4; Sodium 138  personally reviewed    Other studies personally reviewed: Epic records reviewed   ASSESSMENT AND PLAN:  1.  Persistent atrial fibrillation Has been persistent for the last 3 days which correlates when she developed the rash that is causing her to  be very uncomfortable, ? Etiology. I can't increase her BB as she has issues with hypotension.  Her HR today is controlled in the 70's.  I feel if her rash is resolved the afib will quite down. I am concerned with her taking benadryl with tikosyn  because of the qt prolongation possibility. The cream is not  helping as well. So I advised her to talk to PCP today re steroid course for the rash. If steroids are used, she will need to remind PCp that she may need an INR check sooner that she is scheduled. PCP does follow warfarin.   This patients CHA2DS2-VASc Score and unadjusted Ischemic Stroke Rate (% per year) is equal to 4.8 % stroke rate/year from a score of 4  Above score calculated as 1 point each if present [CHF, HTN, DM, Vascular=MI/PAD/Aortic Plaque, Age if 65-74, or Female] Above score calculated as 2 points each if present [Age > 75, or Stroke/TIA/TE]    COVID screen The patient does not have any symptoms that suggest any further testing/ screening at this time.  Social distancing reinforced today.   Follow-up: Wednesday 4/22 at 11:30  Current medicines are reviewed at length with the patient today.   The patient does not have concerns regarding her medicines.  The following changes were made today:  none  Labs/ tests ordered today include: none No  orders of the defined types were placed in this encounter.   Patient Risk:  after full review of this patients clinical status, I feel that they are at mod risk at this time.   Today, I have spent 15 minutes with the patient with telehealth technology discussing rash/afib  Signed, Roderic Palau NP  02/10/2019 11:58 AM  Afib [facility] [address] [place], [place] [address] [phone]   I hereby voluntarily request, consent and authorize the [facility] and its employed or contracted physicians, physician assistants, nurse practitioners or other licensed health care professionals (the Practitioner), to provide me with telemedicine health care services (the "Services") as deemed necessary by the treating Practitioner. I acknowledge and consent to receive the Services by the Practitioner via telemedicine. I understand that the telemedicine visit will involve communicating with the Practitioner through live audiovisual communication technology and the disclosure of certain medical information by electronic transmission. I acknowledge that I have been given the opportunity to request an in-person assessment or other available alternative prior to the telemedicine visit and am voluntarily participating in the telemedicine visit.   I understand that I have the right to withhold or withdraw my consent to the use of telemedicine in the course of my care at any time, without affecting my right to future care or treatment, and that the Practitioner or I may terminate the telemedicine visit at any time. I understand that I have the right to inspect all information obtained and/or recorded in the course of the telemedicine visit and may receive copies of available information for a reasonable fee.  I understand that some of the potential risks of receiving the Services via telemedicine include:   Delay or interruption in medical evaluation due to technological equipment  failure or disruption;  Information transmitted may not be sufficient (e.g. poor resolution of images) to allow for appropriate medical decision making by the Practitioner; and/or  In rare instances, security protocols could fail, causing a breach of personal health information.   Furthermore, I acknowledge that it is my responsibility to provide information about my medical history, conditions  and care that is complete and accurate to the best of my ability. I acknowledge that Practitioner's advice, recommendations, and/or decision may be based on factors not within their control, such as incomplete or inaccurate data provided by me or distortions of diagnostic images or specimens that may result from electronic transmissions. I understand that the practice of medicine is not an exact science and that Practitioner makes no warranties or guarantees regarding treatment outcomes. I acknowledge that I will receive a copy of this consent concurrently upon execution via email to the email address I last provided but may also request a printed copy by calling the office of the [facility].  I understand that my insurance will be billed for this visit.   I have read or had this consent read to me.  I understand the contents of this consent, which adequately explains the benefits and risks of the Services being provided via telemedicine.  I have been provided ample opportunity to ask questions regarding this consent and the Services and have had my questions answered to my satisfaction.  I give my informed consent for the services to be provided through the use of telemedicine in my medical care  By participating in this telemedicine visit I agree to the above.

## 2019-02-10 NOTE — Progress Notes (Signed)
Virtual Visit via Telephone Note  I connected with [name] on 02/10/19 at  7:00 AM EDT by telephone and verified that I am speaking with the correct person using two identifiers.   I discussed the limitations, risks, security and privacy concerns of performing an evaluation and management service by telephone and the availability of in person appointments. I also discussed with the patient that there may be a patient responsible charge related to this service. She expressed understanding and agreed to proceed.  Location patient: home Location provider: work office Participants present for the call: patient, provider  Patient did not have a visit in the prior 7 days to address this/these issue(s).   History of Present Illness: Ms Molly [name] is a 85 yo female with Hx of atrial fib on Coumadin, CHF, and anxiety and mood some, who is complaining about intense skin pruritus and intermittent raised erythematous rash. Negative for new medication, detergent, soap, insect bite, or outdoor exposure. She feels the problem is getting worse. She denies any associated fever, chills, oral lesions, facial edema, wheezing, cough, dyspnea body aches, oral lesions, abdominal pain, nausea, vomiting, or changes in bowel habits. Pruritus is interfering with his sleep, such as fatigue.  No similar problem in the past.  I saw her on 11/30/2018, when she was complaining about a day of generalized pruritus and erythematous rash. During the office visit I did not appreciate skin rash. She states that lesions come and go. She has not identified exacerbating or alleviating factors.  I recommended topical triamcinolone + OTC S[place]. She is still taking Benadryl daily as needed.  Because she did not noted any improvement, I recommended dermatology referral. According to patient, she was seen by dermatologist on 01/26/2019, she was told that she was having "hives." She was told to continue taking Benadryl.     Observations/Objective: Patient sounds cheerful and well on the phone. I do not appreciate any SOB. Speech and thought processing are grossly intact. Patient reported vitals:BP 111/71  Assessment and Plan: Diagnoses and all orders for this visit:  Skin pruritus Possible etiologies discussed. Hx suggest urticaria. OTC Zyrtec 10 mg can be taken bid 7-10 days if recommended treatment does not help.  -     famotidine (PEPCID) 20 MG tablet; Take 1 tablet (20 mg total) by mouth 2 (two) times daily for 30 days.  Urticaria Side effects of medications discussed. Prednisone taper recommended, instructed to take medication in the morning with breakfast ; she understands side effects. If problem is persistent after completing treatment, will need to consider immunology referral.  -     predniSONE (DELTASONE) 20 MG tablet; 2 tabs for 5 days, 1 tabs for 3 days, 1/2 tabs for 3 days.Take tables together with breakfast. -     famotidine (PEPCID) 20 MG tablet; Take 1 tablet (20 mg total) by mouth 2 (two) times daily for 30 days.  Follow Up Instructions: Because she is on coumadin, INR needs to be check in 3 days. Avoid Benadryl, side effects discussed. Recommend monitoring BP daily. Instructed about warning signs.  I did not refer this patient for an OV in the next 24 hours for this/these issue(s).  I discussed the assessment and treatment plan with the patient. She was provided an opportunity to ask questions and all were answered. The patient agreed with the plan and demonstrated an understanding of the instructions.   The patient was advised to call back or seek an in-person evaluation if the symptoms worsen or if  the condition fails to improve as anticipated.  I provided 21 minutes of non-face-to-face time during this encounter.   Betty Martinique, MD

## 2019-02-10 NOTE — Telephone Encounter (Signed)
If she already established with dermatologist and recently seen because treatment I initially recommended did not help. Please advise patient to call dermatologist's office, they can give recommendations according to diagnosis.  Thanks, BJ

## 2019-02-10 NOTE — Telephone Encounter (Signed)
Patient given recommendations per Dr. Martinique. Patient verbalized understanding.

## 2019-02-15 ENCOUNTER — Ambulatory Visit (HOSPITAL_COMMUNITY)
Admission: RE | Admit: 2019-02-15 | Discharge: 2019-02-15 | Disposition: A | Discharge status: Home / Self Care | Payer: Medicare Other | Source: Ambulatory Visit | Attending: Nurse Practitioner | Admitting: Nurse Practitioner

## 2019-02-15 ENCOUNTER — Ambulatory Visit (INDEPENDENT_AMBULATORY_CARE_PROVIDER_SITE_OTHER): Payer: Medicare Other | Admitting: General Practice

## 2019-02-15 ENCOUNTER — Other Ambulatory Visit: Payer: Self-pay

## 2019-02-15 ENCOUNTER — Encounter (HOSPITAL_COMMUNITY): Payer: Self-pay | Admitting: Nurse Practitioner

## 2019-02-15 DIAGNOSIS — I4819 Other persistent atrial fibrillation: Secondary | ICD-10-CM

## 2019-02-15 DIAGNOSIS — Z5181 Encounter for therapeutic drug level monitoring: Secondary | ICD-10-CM

## 2019-02-15 LAB — POCT INR: INR: 3.8 — AB (ref 2.0–3.0)

## 2019-02-15 NOTE — Progress Notes (Signed)
Electrophysiology TeleHealth Note   Due to national recommendations of social distancing due to COVID 19, Audio/video may change  We will see. telehealth visit is felt to be most appropriate for this patient at this time.  See MyChart message/consent below from today for patient consent regarding telehealth for the [facility].    Date:  02/15/2019   ID:  Molly [name], DOB Oct 12, 1933, MRN [id number]  Location: home  Provider location: [address] [place], [place] [address] Evaluation Performed: Follow up  PCP:  [place], Betty G, MD  Primary Cardiologist:  Dr. Radford Pax Primary Electrophysiologist: Dr.Taylor   CC:f/u afib   History of Present Illness: Molly [name] is a 85 y.o. female who presents via audio conferencing for a telehealth visit today.     The pt's device check picked up persistent afib for 2-3 days last week. When she was called she reported a simmering rash for weeks but was really to the point where the itching was keeping her awake and making her miserable. She had seen a dermatologist early last week and was given some rx cream but still the rash was worse. PCP called in prednisone taper and pepcd last Friday. She reports that rash is much better and shew wi ssleeping well at night. She still feels some fatigue abut is not aware of her heart racing. BP today at 100 /60 amd HR 74 and regular. She does not notice any retention of fluid.   Today, she denies symptoms of palpitations, chest pain, shortness of breath, orthopnea, PND, lower extremity edema, claudication, dizziness, presyncope, syncope, bleeding, or neurologic sequela. The patient is tolerating medications without difficulties and is otherwise without complaint today.   she denies symptoms of cough, fevers, chills, or new SOB worrisome for COVID 19.   Atrial Fibrillation Risk Factors:  she does not have symptoms or diagnosis of sleep apnea. she does not have a history of rheumatic  fever. she does not have a history of alcohol use. The patient does not have a history of early familial atrial fibrillation or other arrhythmias.  she has a BMI of Body mass index is 30.18 kg/m.Marland Kitchen Filed Weights   02/15/19 1114  Weight: 84.8 kg    Past Medical History:  Diagnosis Date   Aortic insufficiency    mild by echo 2017   Cancer ([place])    Skin cancer- basal.  1 mylenoma   Complication of anesthesia    DCM (dilated cardiomyopathy) ([place])    EF 40-45% by echo 2017 - nuclear stress test with no ischemia   GERD (gastroesophageal reflux disease)    H/O benign essential tremor    on amiodarone resolved off amio   H/O hyperkalemia    Resolved off ACE inhibitors   High cholesterol    History of blood transfusion    Hyperparathyroidism ([place])    Hypertension    Mitral regurgitation    mild to moderate by echo 2017   Persistent atrial fibrillation    PONV (postoperative nausea and vomiting)    PVC's (premature ventricular contractions)    Renal insufficiency    MILD   Shortness of breath    with exertion   Tachycardia-bradycardia syndrome ([place])    s/p PPM 02/2013   Past Surgical History:  Procedure Laterality Date   AMPUTATION Right 11/27/2014   Procedure: RIGHT 2ND AND 3RD TOE AMPUTATION;  Surgeon: Wylene Simmer, MD;  Location: [place];  Service: Orthopedics;  Laterality: Right;   BUNIONECTOMY  Right   CARDIAC CATHETERIZATION  09/25/2000   EF of 50% -- with normal left ventricular size and function   CARDIOVERSION N/A 12/28/2017   Procedure: CARDIOVERSION;  Surgeon: Dorothy Spark, MD;  Location[facility] ENDOSCOPY;  Service: Cardiovascular;  Laterality: N/A;   CARDIOVERSION N/A 02/25/2018   Procedure: CARDIOVERSION;  Surgeon: Skeet Latch, MD;  Location: [place];  Service: Cardiovascular;  Laterality: N/A;   [place] Bilateral    Cataract   FOOT SURGERY     INSERT / REPLACE / REMOVE  PACEMAKER  02/2013   PACEMAKER INSERTION  02/2013   PERMANENT PACEMAKER INSERTION N/A 03/02/2013   Procedure: PERMANENT PACEMAKER INSERTION;  Surgeon: Evans Lance, MD;  Location: [facility] CATH LAB;  Service: Cardiovascular;  Laterality: N/A;   SHOULDER ARTHROSCOPY W/ ROTATOR CUFF REPAIR     SHOULDER SURGERY Right    roto cuff   TOE AMPUTATION Left    2nd and 3rd, bunion under toes.   TONSILLECTOMY     VARICOSE VEIN SURGERY       Current Outpatient Medications  Medication Sig Dispense Refill   acetaminophen (TYLENOL) 500 MG tablet Take 1,000 mg by mouth every 6 (six) hours as needed. For pain     carvedilol (COREG) 3.125 MG tablet TAKE 1 TABLET TWICE A DAY WITH A MEAL 180 tablet 3   dofetilide (TIKOSYN) 250 MCG capsule TAKE ONE CAPSULE (250MG  TOTAL) BY MOUTH TWO TIMES DAILY 180 capsule 3   dorzolamide (TRUSOPT) 2 % ophthalmic solution Place 1 drop into the right eye 2 (two) times daily.     famotidine (PEPCID) 20 MG tablet Take 1 tablet (20 mg total) by mouth 2 (two) times daily for 30 days. 60 tablet 0   LORazepam (ATIVAN) 0.5 MG tablet Take 1 tablet (0.5 mg total) by mouth at bedtime as needed. 30 tablet 2   Magnesium 250 MG TABS Take 1 tablet (250 mg total) by mouth daily. 90 tablet 3   Multiple Vitamin (MULTIVITAMIN WITH MINERALS) TABS Take 1 tablet by mouth daily.     predniSONE (DELTASONE) 20 MG tablet 2 tabs for 5 days, 1 tabs for 3 days, 1/2 tabs for 3 days.Take tables together with breakfast. 20 tablet 0   simvastatin (ZOCOR) 20 MG tablet Take 20 mg by mouth daily at 6 PM.      spironolactone (ALDACTONE) 25 MG tablet Take 0.5 tablets (12.5 mg total) by mouth daily. 45 tablet 3   Triamcinolone Acetonide (TRIAMCINOLONE 0.1 % CREAM : EUCERIN) CREA Apply 1 application topically 2 (two) times daily as needed for rash or itching. 500 each 1   warfarin (COUMADIN) 5 MG tablet TAKE 1/2 TO 1 TABLET DAILY AS DIRECTED BY ANTICOAGULATION CLINIC 90 tablet 1   No current  facility-administered medications for this encounter.     Allergies:   Codeine and Tramadol   Social History:  The patient  reports that she has never smoked. She has never used smokeless tobacco. She reports that she does not drink alcohol or use drugs.   Family History:  The patient's  family history includes Hypertension in her father and mother.    ROS:  Please see the history of present illness.   All other systems are personally reviewed and negative.   Exam: NA  Recent Labs: 09/26/2018: TSH 3.750 11/07/2018: Magnesium 1.7 01/24/2019: BUN 32; Creatinine, Ser 1.04; Hemoglobin 10.4; NT-Pro BNP 443; Platelets 248; Potassium 5.4; Sodium 138  personally reviewed    Other studies personally reviewed: Epic records reviewed   ASSESSMENT AND PLAN:  1.   atrial fibrillation Pt is feeling better from  Rash but still feels some fatigue, not sure if she is still in afib or not Will allow her to finish prednisone which  will be next Monday I will ask her to send a remote check next Thursday and then will have another tele visit with her the following Monday to determine her rhythm    This patients CHA2DS2-VASc Score and unadjusted Ischemic Stroke Rate (% per year) is equal to 7.2 % stroke rate/year from a score of 5  Above score calculated as 1 point each if present [CHF, HTN, DM, Vascular=MI/PAD/Aortic Plaque, Age if 65-74, or Female] Above score calculated as 2 points each if present [Age > 75, or Stroke/TIA/TE]  2. Rash Resolving  Continue current steroids and pepcid   COVID screen The patient does not have any symptoms that suggest any further testing/ screening at this time.  Social distancing reinforced today.     Follow-up:  5/4 at 11:30  Current medicines are reviewed at length with the patient today.   The patient does not have concerns regarding her medicines.  The following changes were made today:  none  Labs/ tests ordered today include: device check 4/30 No  orders of the defined types were placed in this encounter.   Patient Risk:  after full review of this patients clinical status, I feel that they are at high risk at this time.   Today, I have spent 15 minutes with the patient with telehealth technology discussing rash/afib  Signed, Roderic Palau NP  02/15/2019 11:45 AM  Afib [facility] [address] [place], [place] [address] [phone]   I hereby voluntarily request, consent and authorize the [facility] and its employed or contracted physicians, physician assistants, nurse practitioners or other licensed health care professionals (the Practitioner), to provide me with telemedicine health care services (the "Services") as deemed necessary by the treating Practitioner. I acknowledge and consent to receive the Services by the Practitioner via telemedicine. I understand that the telemedicine visit will involve communicating with the Practitioner through live audiovisual communication technology and the disclosure of certain medical information by electronic transmission. I acknowledge that I have been given the opportunity to request an in-person assessment or other available alternative prior to the telemedicine visit and am voluntarily participating in the telemedicine visit.   I understand that I have the right to withhold or withdraw my consent to the use of telemedicine in the course of my care at any time, without affecting my right to future care or treatment, and that the Practitioner or I may terminate the telemedicine visit at any time. I understand that I have the right to inspect all information obtained and/or recorded in the course of the telemedicine visit and may receive copies of available information for a reasonable fee.  I understand that some of the potential risks of receiving the Services via telemedicine include:   Delay or interruption in medical evaluation due to technological equipment  failure or disruption;  Information transmitted may not be sufficient (e.g. poor resolution of images) to allow for appropriate medical decision making by the Practitioner; and/or  In rare instances, security protocols could fail, causing a breach of personal health information.   Furthermore, I acknowledge that it is my responsibility to provide information about my medical history, conditions and care that is complete  and accurate to the best of my ability. I acknowledge that Practitioner's advice, recommendations, and/or decision may be based on factors not within their control, such as incomplete or inaccurate data provided by me or distortions of diagnostic images or specimens that may result from electronic transmissions. I understand that the practice of medicine is not an exact science and that Practitioner makes no warranties or guarantees regarding treatment outcomes. I acknowledge that I will receive a copy of this consent concurrently upon execution via email to the email address I last provided but may also request a printed copy by calling the office of the [facility].  I understand that my insurance will be billed for this visit.   I have read or had this consent read to me.  I understand the contents of this consent, which adequately explains the benefits and risks of the Services being provided via telemedicine.  I have been provided ample opportunity to ask questions regarding this consent and the Services and have had my questions answered to my satisfaction.  I give my informed consent for the services to be provided through the use of telemedicine in my medical care  By participating in this telemedicine visit I agree to the above.

## 2019-02-15 NOTE — Patient Instructions (Signed)
Pre visit review using our clinic review tool, if applicable. No additional management support is needed unless otherwise documented below in the visit note.  Hold dosage today and tomorrow and then change dosage and take 1/2 tablet everyday except 1 tablet on Mondays, Fridays.   Recheck in  4 weeks.

## 2019-02-17 ENCOUNTER — Telehealth: Payer: Self-pay | Admitting: Cardiology

## 2019-02-17 NOTE — Telephone Encounter (Signed)
Spoke with patient. She continues to have symptomatic paroxysmal atrial fibrillation. Interestingly, she was in AF at 2:32PM when transmission came through but second transmission at 2:33PM was sinus rhythm.  She continues to have palpitations, dizziness and relatively low blood pressures. She finishes prednisone on Monday. I have advised her to not make any changes this weekend but rest when able. Appt with Butch Penny in AF clinic moved up to 4/27. May need to consider amiodarone (previously stopped 2/2 tremor) or AVN ablation.   Chanetta Marshall, NP 02/17/2019 4:29 PM

## 2019-02-17 NOTE — Telephone Encounter (Signed)
Called pt back and she stated that she has been very dizzy. This has been going on off and on all week. She believes she is in afib. Instructed pt to send a manual transmission w/ her home monitor. Once received a Device Tech RN will review and call back.

## 2019-02-17 NOTE — Telephone Encounter (Signed)
New message   Patient would like a call she has questions about her pacemaker.

## 2019-02-20 ENCOUNTER — Encounter (HOSPITAL_COMMUNITY): Payer: Self-pay | Admitting: Nurse Practitioner

## 2019-02-20 ENCOUNTER — Other Ambulatory Visit: Payer: Self-pay

## 2019-02-20 ENCOUNTER — Ambulatory Visit: Payer: Medicare Other

## 2019-02-20 ENCOUNTER — Ambulatory Visit (HOSPITAL_COMMUNITY)
Admission: RE | Admit: 2019-02-20 | Discharge: 2019-02-20 | Disposition: A | Discharge status: Home / Self Care | Payer: Medicare Other | Source: Ambulatory Visit | Attending: Nurse Practitioner | Admitting: Nurse Practitioner

## 2019-02-20 VITALS — BP 116/74 | HR 51 | Ht 66.0 in

## 2019-02-20 DIAGNOSIS — I480 Paroxysmal atrial fibrillation: Secondary | ICD-10-CM | POA: Diagnosis not present

## 2019-02-20 NOTE — Progress Notes (Signed)
Electrophysiology TeleHealth Note   Due to national recommendations of social distancing due to [place] 19, Audio/videotelehealth visit is felt to be most appropriate for this patient at this time.  See MyChart message/consent below from today for patient consent regarding telehealth for the Atrial Fibrillation Clinic.    Date:  02/20/2019   ID:  Molly [name], DOB 1933-10-03, MRN [id number]  Location: home Provider location: [address] [place], [place] [address] Evaluation Performed: Follow up  PCP:  [place], Betty G, MD  Primary Cardiologist:   Primary Electrophysiologist: Dr. [name]  XK:GYJE   History of Present Illness: Molly [name] is a 85 y.o. female who presents via audio conferencing for a telehealth visit today.    The pt had return of symptomatic afib  about the time that she became very uncomfortable with a generalized rash. When I talked to her last she was feeling improved with the rash and was able to sleep. She was scheduled to send in a PPM report mid week but felt so symptomatic Friday, she went ahead and sent in a report. The first report showed afib but the second report showed SR. Today, she feels better than Friday, although she still has some dizziness and fatigue. She is tapering off prednisone and will be finished tomorrow.the rash is gone and she is not having any further itching.  Today, she denies symptoms of palpitations, chest pain, shortness of breath, orthopnea, PND, lower extremity edema, claudication,  presyncope, syncope, bleeding, or neurologic sequela. + for dizziness, fatigue.The patient is tolerating medications without difficulties and is otherwise without complaint today.   she denies symptoms of cough, fevers, chills, or new SOB worrisome for COVID 19.      she has a BMI of Body mass index is 30.18 kg/m.Marland Kitchen There were no vitals filed for this visit.  Past Medical History:  Diagnosis Date  . Aortic insufficiency    mild by echo  2017  . Cancer ([place])    Skin cancer- basal.  1 mylenoma  . Complication of anesthesia   . DCM (dilated cardiomyopathy) ([place])    EF 40-45% by echo 2017 - nuclear stress test with no ischemia  . GERD (gastroesophageal reflux disease)   . H/O benign essential tremor    on amiodarone resolved off amio  . H/O hyperkalemia    Resolved off ACE inhibitors  . High cholesterol   . History of blood transfusion   . Hyperparathyroidism ([place])   . Hypertension   . Mitral regurgitation    mild to moderate by echo 2017  . Persistent atrial fibrillation   . PONV (postoperative nausea and vomiting)   . PVC's (premature ventricular contractions)   . Renal insufficiency    MILD  . Shortness of breath    with exertion  . Tachycardia-bradycardia syndrome [facility])    s/p PPM 02/2013   Past Surgical History:  Procedure Laterality Date  . AMPUTATION Right 11/27/2014   Procedure: RIGHT 2ND AND 3RD TOE AMPUTATION;  Surgeon: Wylene Simmer, MD;  Location: [place];  Service: Orthopedics;  Laterality: Right;  . BUNIONECTOMY     Right  . CARDIAC CATHETERIZATION  09/25/2000   EF of 50% -- with normal left ventricular size and function  . CARDIOVERSION N/A 12/28/2017   Procedure: CARDIOVERSION;  Surgeon: Dorothy Spark, MD;  [facility] ENDOSCOPY;  Service: Cardiovascular;  Laterality: N/A;  . CARDIOVERSION N/A 02/25/2018   Procedure: CARDIOVERSION;  Surgeon: Skeet Latch, MD;  Location: [place];  Service: Cardiovascular;  Laterality: N/A;  . CESAREAN SECTION    . CESAREAN SECTION    . EYE SURGERY Bilateral    Cataract  . FOOT SURGERY    . INSERT / REPLACE / REMOVE PACEMAKER  02/2013  . PACEMAKER INSERTION  02/2013  . PERMANENT PACEMAKER INSERTION N/A 03/02/2013   Procedure: PERMANENT PACEMAKER INSERTION;  Surgeon: Evans Lance, MD;  Location: [facility] CATH LAB;  Service: Cardiovascular;  Laterality: N/A;  . SHOULDER ARTHROSCOPY W/ ROTATOR CUFF REPAIR    . SHOULDER SURGERY Right    roto cuff  . TOE  AMPUTATION Left    2nd and 3rd, bunion under toes.  . TONSILLECTOMY    . VARICOSE VEIN SURGERY       Current Outpatient Medications  Medication Sig Dispense Refill  . acetaminophen (TYLENOL) 500 MG tablet Take 1,000 mg by mouth every 6 (six) hours as needed. For pain    . carvedilol (COREG) 3.125 MG tablet TAKE 1 TABLET TWICE A DAY WITH A MEAL 180 tablet 3  . dofetilide (TIKOSYN) 250 MCG capsule TAKE ONE CAPSULE (250MG  TOTAL) BY MOUTH TWO TIMES DAILY 180 capsule 3  . dorzolamide (TRUSOPT) 2 % ophthalmic solution Place 1 drop into the right eye 2 (two) times daily.    . famotidine (PEPCID) 20 MG tablet Take 1 tablet (20 mg total) by mouth 2 (two) times daily for 30 days. 60 tablet 0  . LORazepam (ATIVAN) 0.5 MG tablet Take 0.5 mg by mouth at bedtime.    . Magnesium 250 MG TABS Take 1 tablet (250 mg total) by mouth daily. 90 tablet 3  . Multiple Vitamin (MULTIVITAMIN WITH MINERALS) TABS Take 1 tablet by mouth daily.    . predniSONE (DELTASONE) 20 MG tablet 2 tabs for 5 days, 1 tabs for 3 days, 1/2 tabs for 3 days.Take tables together with breakfast. 20 tablet 0  . simvastatin (ZOCOR) 20 MG tablet Take 20 mg by mouth daily at 6 PM.     . spironolactone (ALDACTONE) 25 MG tablet Take 0.5 tablets (12.5 mg total) by mouth daily. 45 tablet 3  . Triamcinolone Acetonide (TRIAMCINOLONE 0.1 % CREAM : EUCERIN) CREA Apply 1 application topically 2 (two) times daily as needed for rash or itching. 500 each 1  . warfarin (COUMADIN) 5 MG tablet TAKE 1/2 TO 1 TABLET DAILY AS DIRECTED BY ANTICOAGULATION CLINIC 90 tablet 1   No current facility-administered medications for this encounter.     Allergies:   Codeine and Tramadol   Social History:  The patient  reports that she has never smoked. She has never used smokeless tobacco. She reports that she does not drink alcohol or use drugs.   Family History:  The patient's  family history includes Hypertension in her father and mother.    ROS:  Please see the  history of present illness.   All other systems are personally reviewed and negative.   Exam: NA, telephone viisit  Recent Labs: 09/26/2018: TSH 3.750 11/07/2018: Magnesium 1.7 01/24/2019: BUN 32; Creatinine, Ser 1.04; Hemoglobin 10.4; NT-Pro BNP 443; Platelets 248; Potassium 5.4; Sodium 138  personally reviewed    Other studies personally reviewed: Epic records reviewed    ASSESSMENT AND PLAN:  1.  Paroxysmal  atrial fibrillation Pt last reports shows that she is having paroxysmal afib sounds like she may be in SR today with HR's in the 50's, compared to Friday went she felt worse in the 100's. Cardioversion not an option right now as she is  paroxysmal  May still have afib burden  to decrease when she is totally off prednisone Other considerations are to stop tikosyn and retry amiodarone( had tremor with it before, or consider AV nodal ablation) if afib burden remains high  No change in therapy today    COVID screen The patient does not have any symptoms that suggest any further testing/ screening at this time.  Social distancing reinforced today.   Follow-up: she will send another report later this week and I will f/u with her by phone early next week I will request f/u with Dr. [name] in one month  Current medicines are reviewed at length with the patient today.   The patient does not have concerns regarding her medicines.  The following changes were made today:  none  Labs/ tests ordered today include:  none No orders of the defined types were placed in this encounter.   Patient Risk:  after full review of this patients clinical status, I feel that they are at high  risk at this time.   Today, I have spent 15 minutes with the patient with telehealth technology discussing above  Signed, Roderic Palau NP  02/20/2019 11:55 AM  [facility] [address] [place], [place] [address] [phone]   I hereby voluntarily request, consent and  authorize the [facility] and its employed or contracted physicians, physician assistants, nurse practitioners or other licensed health care professionals (the Practitioner), to provide me with telemedicine health care services (the "Services") as deemed necessary by the treating Practitioner. I acknowledge and consent to receive the Services by the Practitioner via telemedicine. I understand that the telemedicine visit will involve communicating with the Practitioner through live audiovisual communication technology and the disclosure of certain medical information by electronic transmission. I acknowledge that I have been given the opportunity to request an in-person assessment or other available alternative prior to the telemedicine visit and am voluntarily participating in the telemedicine visit.   I understand that I have the right to withhold or withdraw my consent to the use of telemedicine in the course of my care at any time, without affecting my right to future care or treatment, and that the Practitioner or I may terminate the telemedicine visit at any time. I understand that I have the right to inspect all information obtained and/or recorded in the course of the telemedicine visit and may receive copies of available information for a reasonable fee.  I understand that some of the potential risks of receiving the Services via telemedicine include:   Delay or interruption in medical evaluation due to technological equipment failure or disruption;  Information transmitted may not be sufficient (e.g. poor resolution of images) to allow for appropriate medical decision making by the Practitioner; and/or  In rare instances, security protocols could fail, causing a breach of personal health information.   Furthermore, I acknowledge that it is my responsibility to provide information about my medical history, conditions and care that is complete and accurate to the best of my ability. I  acknowledge that Practitioner's advice, recommendations, and/or decision may be based on factors not within their control, such as incomplete or inaccurate data provided by me or distortions of diagnostic images or specimens that may result from electronic transmissions. I understand that the practice of medicine is not an exact science and that Practitioner makes no warranties or guarantees regarding treatment outcomes. I acknowledge that I will receive a copy of this consent concurrently upon execution via  email to the email address I last provided but may also request a printed copy by calling the office of the [facility].  I understand that my insurance will be billed for this visit.   I have read or had this consent read to me.  I understand the contents of this consent, which adequately explains the benefits and risks of the Services being provided via telemedicine.  I have been provided ample opportunity to ask questions regarding this consent and the Services and have had my questions answered to my satisfaction.  I give my informed consent for the services to be provided through the use of telemedicine in my medical care  By participating in this telemedicine visit I agree to the above.

## 2019-02-22 ENCOUNTER — Ambulatory Visit (INDEPENDENT_AMBULATORY_CARE_PROVIDER_SITE_OTHER): Payer: Medicare Other | Admitting: *Deleted

## 2019-02-22 ENCOUNTER — Other Ambulatory Visit: Payer: Self-pay

## 2019-02-22 DIAGNOSIS — I4819 Other persistent atrial fibrillation: Secondary | ICD-10-CM

## 2019-02-22 DIAGNOSIS — I495 Sick sinus syndrome: Secondary | ICD-10-CM

## 2019-02-23 LAB — CUP PACEART REMOTE DEVICE CHECK
Battery Remaining Longevity: 66 mo
Battery Remaining Percentage: 68 %
Brady Statistic RA Percent Paced: 77 %
Brady Statistic RV Percent Paced: 17 %
Date Time Interrogation Session: 20200430070800
Implantable Lead Implant Date: 20140508
Implantable Lead Implant Date: 20140508
Implantable Lead Location: 753859
Implantable Lead Location: 753860
Implantable Lead Model: 4135
Implantable Lead Model: 4136
Implantable Lead Serial Number: 29333020
Implantable Lead Serial Number: 29379474
Implantable Pulse Generator Implant Date: 20140508
Lead Channel Impedance Value: 601 Ohm
Lead Channel Impedance Value: 660 Ohm
Lead Channel Setting Pacing Amplitude: 2 V
Lead Channel Setting Pacing Amplitude: 2.4 V
Lead Channel Setting Pacing Pulse Width: 0.4 ms
Lead Channel Setting Sensing Sensitivity: 2.5 mV
Pulse Gen Serial Number: 112392

## 2019-02-27 ENCOUNTER — Ambulatory Visit (HOSPITAL_COMMUNITY)
Admission: RE | Admit: 2019-02-27 | Discharge: 2019-02-27 | Disposition: A | Discharge status: Home / Self Care | Payer: Medicare Other | Source: Ambulatory Visit | Attending: Nurse Practitioner | Admitting: Nurse Practitioner

## 2019-02-27 ENCOUNTER — Other Ambulatory Visit: Payer: Self-pay

## 2019-02-27 ENCOUNTER — Encounter (HOSPITAL_COMMUNITY): Payer: Self-pay | Admitting: Nurse Practitioner

## 2019-02-27 ENCOUNTER — Ambulatory Visit (HOSPITAL_COMMUNITY): Payer: Medicare Other | Admitting: Nurse Practitioner

## 2019-02-27 VITALS — Ht 66.0 in

## 2019-02-27 DIAGNOSIS — I480 Paroxysmal atrial fibrillation: Secondary | ICD-10-CM

## 2019-02-27 MED ORDER — MAGNESIUM OXIDE -MG SUPPLEMENT 500 MG PO TABS: 250.0000 mg | ORAL_TABLET | Freq: Every day | ORAL | 6 refills | Status: DC

## 2019-02-27 NOTE — Progress Notes (Signed)
Electrophysiology TeleHealth Note   Due to national recommendations of social distancing due to [place] 19, Audio/videotelehealth visit is felt to be most appropriate for this patient at this time.  See MyChart message/consent below from today for patient consent regarding telehealth for the [facility].    Date:  02/27/2019   ID:  Molly [name], DOB [birth year], MRN [id number]  Location: home Provider location: [address] [place], [place] [address] Evaluation Performed: Follow up  PCP:  Martinique, Betty G, MD  Primary Cardiologist:   Primary Electrophysiologist: Dr. [name]  LP:FXTK   History of Present Illness: Molly [name] is a 85 y.o. female who presents via audio conferencing for a telehealth visit today.    The pt had return of symptomatic afib  about the time that she became very uncomfortable with a generalized rash. When I talked to her last she was feeling improved with the rash and was able to sleep. She was scheduled to send in a PPM report mid week but felt so symptomatic Friday, she went ahead and sent in a report. The first report showed afib but the second report showed SR. Today, she feels better than Friday, although she still has some dizziness and fatigue. She is tapering off prednisone and will be finished tomorrow.the rash is gone and she is not having any further itching.  F/u telephone virtual visit 5/4.She sent a report 4/30 and since the last report her afib burden seems to have been reduced. She has not noted any afib for a week. However, the rash has returned since coming off prednisone. She has a f/u with her dermatologist next week. She has had her rash for months. The only med change she can remember is that she changed form RX magnesium at 500 mg a day for diarrhea and went to 250 mg OTC, ? Allergic to a filler in the tablet?  Today, she denies symptoms of palpitations, chest pain, shortness of breath, orthopnea, PND, lower extremity  edema, claudication,  presyncope, syncope, bleeding, or neurologic sequela. + for dizziness, fatigue.The patient is tolerating medications without difficulties and is otherwise without complaint today.   she denies symptoms of cough, fevers, chills, or new SOB worrisome for COVID 19.      she has a BMI of Body mass index is 30.18 kg/m.Marland Kitchen There were no vitals filed for this visit.  Past Medical History:  Diagnosis Date   Aortic insufficiency    mild by echo 2017   Cancer [facility])    Skin cancer- basal.  1 mylenoma   Complication of anesthesia    DCM (dilated cardiomyopathy) ([place])    EF 40-45% by echo 2017 - nuclear stress test with no ischemia   GERD (gastroesophageal reflux disease)    H/O benign essential tremor    on amiodarone resolved off amio   H/O hyperkalemia    Resolved off ACE inhibitors   High cholesterol    History of blood transfusion    Hyperparathyroidism ([place])    Hypertension    Mitral regurgitation    mild to moderate by echo 2017   Persistent atrial fibrillation    PONV (postoperative nausea and vomiting)    PVC's (premature ventricular contractions)    Renal insufficiency    MILD   Shortness of breath    with exertion   Tachycardia-bradycardia syndrome [facility])    s/p PPM 02/2013   Past Surgical History:  Procedure Laterality Date   AMPUTATION Right 11/27/2014  Procedure: RIGHT 2ND AND 3RD TOE AMPUTATION;  Surgeon: Wylene Simmer, MD;  Location: [place];  Service: Orthopedics;  Laterality: Right;   BUNIONECTOMY     Right   CARDIAC CATHETERIZATION  09/25/2000   EF of 50% -- with normal left ventricular size and function   CARDIOVERSION N/A 12/28/2017   Procedure: CARDIOVERSION;  Surgeon: Dorothy Spark, MD;  Location: [facility] ENDOSCOPY;  Service: Cardiovascular;  Laterality: N/A;   CARDIOVERSION N/A 02/25/2018   Procedure: CARDIOVERSION;  Surgeon: Skeet Latch, MD;  Location: [place];  Service: Cardiovascular;  Laterality: N/A;    [place] Bilateral    Cataract   FOOT SURGERY     INSERT / REPLACE / REMOVE PACEMAKER  02/2013   PACEMAKER INSERTION  02/2013   PERMANENT PACEMAKER INSERTION N/A 03/02/2013   Procedure: PERMANENT PACEMAKER INSERTION;  Surgeon: Evans Lance, MD;  Location: [facility] C[facility] LAB;  Service: Cardiovascular;  Laterality: N/A;   SHOULDER ARTHROSCOPY W/ ROTATOR CUFF REPAIR     SHOULDER SURGERY Right    roto cuff   TOE AMPUTATION Left    2nd and 3rd, bunion under toes.   TONSILLECTOMY     VARICOSE VEIN SURGERY       Current Outpatient Medications  Medication Sig Dispense Refill   acetaminophen (TYLENOL) 500 MG tablet Take 1,000 mg by mouth every 6 (six) hours as needed. For pain     carvedilol (COREG) 3.125 MG tablet TAKE 1 TABLET TWICE A DAY WITH A MEAL 180 tablet 3   dofetilide (TIKOSYN) 250 MCG capsule TAKE ONE CAPSULE (250MG  TOTAL) BY MOUTH TWO TIMES DAILY 180 capsule 3   dorzolamide (TRUSOPT) 2 % ophthalmic solution Place 1 drop into the right eye 2 (two) times daily.     famotidine (PEPCID) 20 MG tablet Take 1 tablet (20 mg total) by mouth 2 (two) times daily for 30 days. 60 tablet 0   LORazepam (ATIVAN) 0.5 MG tablet Take 0.5 mg by mouth at bedtime.     Magnesium Oxide 500 MG TABS Take 0.5 tablets (250 mg total) by mouth daily. 15 tablet 6   Multiple Vitamin (MULTIVITAMIN WITH MINERALS) TABS Take 1 tablet by mouth daily.     predniSONE (DELTASONE) 20 MG tablet 2 tabs for 5 days, 1 tabs for 3 days, 1/2 tabs for 3 days.Take tables together with breakfast. 20 tablet 0   simvastatin (ZOCOR) 20 MG tablet Take 20 mg by mouth daily at 6 PM.      spironolactone (ALDACTONE) 25 MG tablet Take 0.5 tablets (12.5 mg total) by mouth daily. 45 tablet 3   Triamcinolone Acetonide (TRIAMCINOLONE 0.1 % CREAM : EUCERIN) CREA Apply 1 application topically 2 (two) times daily as needed for rash or itching. 500 each 1   warfarin (COUMADIN) 5 MG tablet  TAKE 1/2 TO 1 TABLET DAILY AS DIRECTED BY ANTICOAGULATION CLINIC 90 tablet 1   No current facility-administered medications for this encounter.     Allergies:   Codeine and Tramadol   Social History:  The patient  reports that she has never smoked. She has never used smokeless tobacco. She reports that she does not drink alcohol or use drugs.   Family History:  The patient's  family history includes Hypertension in her father and mother.    ROS:  Please see the history of present illness.   All other systems are personally reviewed and negative.   Exam: NA, telephone viisit  Recent Labs: 09/26/2018: TSH 3.750 11/07/2018: Magnesium 1.7 01/24/2019: BUN 32; Creatinine, Ser 1.04; Hemoglobin 10.4; NT-Pro BNP 443; Platelets 248; Potassium 5.4; Sodium 138  personally reviewed    Other studies personally reviewed: Epic records reviewed    ASSESSMENT AND PLAN:  1.  Paroxysmal  atrial fibrillation Pt last reports shows that 6% burden. HR has been in the 70's and BP this am 106/75 NO change in approach  2. Rash Resolved with prednisone but now back with coming off prednisone The only thing I can think of med wise that may have contributed to rash is change a few months back for magnesium 500 mg daily for diarrhea  and when she went on OTC [employer] mag Could she possibly be allergic to the filler used in the tablet?  I have advised her to stop the OTC mag and will call in RX mag at 250 mg daily She will f/u with her dermatologist next week    COVID screen The patient does not have any symptoms that suggest any further testing/ screening at this time.  Social distancing reinforced today.   Follow-up: Appointment pending with Dr. [name] 5/26  Current medicines are reviewed at length with the patient today.   The following changes were made today:  OTC to RX  mag  Labs/ tests ordered today include:  none No orders of the defined types were placed in this encounter.   Patient  Risk:  after full review of this patients clinical status, I feel that they are at high  risk at this time.   Today, I have spent 12 minutes with the patient with telehealth technology discussing above  Signed, Roderic Palau NP  02/27/2019 11:57 AM  Afib [facility] [address] [place], [place] [address] [phone]   I hereby voluntarily request, consent and authorize the [facility] and its employed or contracted physicians, physician assistants, nurse practitioners or other licensed health care professionals (the Practitioner), to provide me with telemedicine health care services (the "Services") as deemed necessary by the treating Practitioner. I acknowledge and consent to receive the Services by the Practitioner via telemedicine. I understand that the telemedicine visit will involve communicating with the Practitioner through live audiovisual communication technology and the disclosure of certain medical information by electronic transmission. I acknowledge that I have been given the opportunity to request an in-person assessment or other available alternative prior to the telemedicine visit and am voluntarily participating in the telemedicine visit.   I understand that I have the right to withhold or withdraw my consent to the use of telemedicine in the course of my care at any time, without affecting my right to future care or treatment, and that the Practitioner or I may terminate the telemedicine visit at any time. I understand that I have the right to inspect all information obtained and/or recorded in the course of the telemedicine visit and may receive copies of available information for a reasonable fee.  I understand that some of the potential risks of receiving the Services via telemedicine include:   Delay or interruption in medical evaluation due to technological equipment failure or disruption;  Information transmitted may not be sufficient  (e.g. poor resolution of images) to allow for appropriate medical decision making by the Practitioner; and/or  In rare instances, security protocols could fail, causing a breach of personal health information.   Furthermore, I acknowledge that it is my responsibility to provide information about my medical history, conditions and  care that is complete and accurate to the best of my ability. I acknowledge that Practitioner's advice, recommendations, and/or decision may be based on factors not within their control, such as incomplete or inaccurate data provided by me or distortions of diagnostic images or specimens that may result from electronic transmissions. I understand that the practice of medicine is not an exact science and that Practitioner makes no warranties or guarantees regarding treatment outcomes. I acknowledge that I will receive a copy of this consent concurrently upon execution via email to the email address I last provided but may also request a printed copy by calling the office of the [facility].  I understand that my insurance will be billed for this visit.   I have read or had this consent read to me.  I understand the contents of this consent, which adequately explains the benefits and risks of the Services being provided via telemedicine.  I have been provided ample opportunity to ask questions regarding this consent and the Services and have had my questions answered to my satisfaction.  I give my informed consent for the services to be provided through the use of telemedicine in my medical care  By participating in this telemedicine visit I agree to the above.

## 2019-03-03 ENCOUNTER — Ambulatory Visit: Payer: Self-pay | Admitting: *Deleted

## 2019-03-03 NOTE — Telephone Encounter (Signed)
Pt states reoccurring rash, generalized, itchy. Has seen Dr. Martinique, placed on prednisone "Which helped but had to come off due to a fib." Pt states rash is back as before, severe itching. No fever, no SOB. States she has appt with dermatologist Monday at 1610 but is questioning if she should cancel and see allergist instead. Requesting referral to allergist if Dr. Martinique thinks appropriate. Advised to go to ED if symptoms worsen, SOB occurs.Pt aware practice is currently closed and may be Monday before response.  Please advise: [phone]

## 2019-03-03 NOTE — Progress Notes (Signed)
Remote pacemaker transmission.   

## 2019-03-03 NOTE — Addendum Note (Signed)
Addended by: Douglass Rivers D on: 03/03/2019 08:51 AM   Modules accepted: Level of Service

## 2019-03-04 ENCOUNTER — Other Ambulatory Visit: Payer: Self-pay | Admitting: Family Medicine

## 2019-03-04 DIAGNOSIS — L299 Pruritus, unspecified: Secondary | ICD-10-CM

## 2019-03-04 DIAGNOSIS — L509 Urticaria, unspecified: Secondary | ICD-10-CM

## 2019-03-06 ENCOUNTER — Telehealth: Payer: Self-pay | Admitting: Internal Medicine

## 2019-03-06 DIAGNOSIS — Z85828 Personal history of other malignant neoplasm of skin: Secondary | ICD-10-CM | POA: Diagnosis not present

## 2019-03-06 DIAGNOSIS — L239 Allergic contact dermatitis, unspecified cause: Secondary | ICD-10-CM | POA: Diagnosis not present

## 2019-03-06 NOTE — Telephone Encounter (Signed)
New message:    Patient would like to know could she get a afternoon appt. Please call patient.

## 2019-03-07 NOTE — Telephone Encounter (Signed)
Spoke with patient and she stated that she saw the dermatologist on 03/06/2019 and he prescribed 10 mg of prednisone. Patient stated that she would take with caution due to her having afib the last time she took medication. Patient has scheduled a follow-up on 03/24/2019 with pcp to discuss further.

## 2019-03-15 ENCOUNTER — Other Ambulatory Visit: Payer: Self-pay

## 2019-03-15 ENCOUNTER — Ambulatory Visit (INDEPENDENT_AMBULATORY_CARE_PROVIDER_SITE_OTHER): Payer: Medicare Other | Admitting: General Practice

## 2019-03-15 ENCOUNTER — Telehealth: Payer: Self-pay | Admitting: Internal Medicine

## 2019-03-15 DIAGNOSIS — Z5181 Encounter for therapeutic drug level monitoring: Secondary | ICD-10-CM | POA: Diagnosis not present

## 2019-03-15 DIAGNOSIS — I4819 Other persistent atrial fibrillation: Secondary | ICD-10-CM

## 2019-03-15 LAB — POCT INR: INR: 2 (ref 2.0–3.0)

## 2019-03-15 NOTE — Telephone Encounter (Signed)
New message    Pt set up for virtual visit with Dr. [name]. Pt does not have a smart phone only a landline, so she will do a phone visit with Dr. [name]. Pt phone number is listed in appt notes.      Virtual Visit Pre-Appointment Phone Call  "(Name), I am calling you today to discuss your upcoming appointment. We are currently trying to limit exposure to the virus that causes COVID-19 by seeing patients at home rather than in the office."  1. "What is the BEST phone number to call the day of the visit?" - include this in appointment notes  2. Do you have or have access to (through a family member/friend) a smartphone with video capability that we can use for your visit?" a. If yes - list this number in appt notes as cell (if different from BEST phone #) and list the appointment type as a VIDEO visit in appointment notes b. If no - list the appointment type as a PHONE visit in appointment notes  3. Confirm consent - "In the setting of the current Covid19 crisis, you are scheduled for a (phone or video) visit with your provider on (date) at (time).  Just as we do with many in-office visits, in order for you to participate in this visit, we must obtain consent.  If you'd like, I can send this to your mychart (if signed up) or email for you to review.  Otherwise, I can obtain your verbal consent now.  All virtual visits are billed to your insurance company just like a normal visit would be.  By agreeing to a virtual visit, we'd like you to understand that the technology does not allow for your provider to perform an examination, and thus may limit your provider's ability to fully assess your condition. If your provider identifies any concerns that need to be evaluated in person, we will make arrangements to do so.  Finally, though the technology is pretty good, we cannot assure that it will always work on either your or our end, and in the setting of a video visit, we may have to convert it to a  phone-only visit.  In either situation, we cannot ensure that we have a secure connection.  Are you willing to proceed?" STAFF: Did the patient verbally acknowledge consent to telehealth visit? Document YES/NO here: YES  4. Advise patient to be prepared - "Two hours prior to your appointment, go ahead and check your blood pressure, pulse, oxygen saturation, and your weight (if you have the equipment to check those) and write them all down. When your visit starts, your provider will ask you for this information. If you have an Apple Watch or Kardia device, please plan to have heart rate information ready on the day of your appointment. Please have a pen and paper handy nearby the day of the visit as well."  5. Give patient instructions for MyChart download to smartphone OR Doximity/Doxy.me as below if video visit (depending on what platform provider is using)  6. Inform patient they will receive a phone call 15 minutes prior to their appointment time (may be from unknown caller ID) so they should be prepared to answer    TELEPHONE CALL NOTE  Molly [name] has been deemed a candidate for a follow-up tele-health visit to limit community exposure during the Covid-19 pandemic. I spoke with the patient via phone to ensure availability of phone/video source, confirm preferred email & phone number, and discuss instructions  and expectations.  I reminded Molly [name] to be prepared with any vital sign and/or heart rhythm information that could potentially be obtained via home monitoring, at the time of her visit. I reminded Molly [name] to expect a phone call prior to her visit.  [place] Harriette Ohara 03/15/2019 10:25 AM   INSTRUCTIONS FOR DOWNLOADING THE MYCHART APP TO SMARTPHONE  - The patient must first make sure to have activated MyChart and know their login information - If Apple, go to [employer] and type in MyChart in the search bar and download the app. If Android, ask patient to go to  [employer] and type in [place] in the search bar and download the app. The app is free but as with any other app downloads, their phone may require them to verify saved payment information or Apple/Android password.  - The patient will need to then log into the app with their MyChart username and password, and select [facility] as their healthcare provider to link the account. When it is time for your visit, go to the MyChart app, find appointments, and click Begin Video Visit. Be sure to Select Allow for your device to access the Microphone and Camera for your visit. You will then be connected, and your provider will be with you shortly.  **If they have any issues connecting, or need assistance please contact MyChart service desk [phone])**  **If using a computer, in order to ensure the best quality for their visit they will need to use either of the following Internet Browsers: [employer], or Google Chrome**  IF USING DOXIMITY or DOXY.ME - The patient will receive a link just prior to their visit by text.     FULL LENGTH CONSENT FOR TELE-HEALTH VISIT   I hereby voluntarily request, consent and authorize [facility] and its employed or contracted physicians, physician assistants, nurse practitioners or other licensed health care professionals (the Practitioner), to provide me with telemedicine health care services (the Services") as deemed necessary by the treating Practitioner. I acknowledge and consent to receive the Services by the Practitioner via telemedicine. I understand that the telemedicine visit will involve communicating with the Practitioner through live audiovisual communication technology and the disclosure of certain medical information by electronic transmission. I acknowledge that I have been given the opportunity to request an in-person assessment or other available alternative prior to the telemedicine visit and am voluntarily participating in the  telemedicine visit.  I understand that I have the right to withhold or withdraw my consent to the use of telemedicine in the course of my care at any time, without affecting my right to future care or treatment, and that the Practitioner or I may terminate the telemedicine visit at any time. I understand that I have the right to inspect all information obtained and/or recorded in the course of the telemedicine visit and may receive copies of available information for a reasonable fee.  I understand that some of the potential risks of receiving the Services via telemedicine include:   Delay or interruption in medical evaluation due to technological equipment failure or disruption;  Information transmitted may not be sufficient (e.g. poor resolution of images) to allow for appropriate medical decision making by the Practitioner; and/or   In rare instances, security protocols could fail, causing a breach of personal health information.  Furthermore, I acknowledge that it is my responsibility to provide information about my medical history, conditions and care that is complete and accurate to  the best of my ability. I acknowledge that Practitioner's advice, recommendations, and/or decision may be based on factors not within their control, such as incomplete or inaccurate data provided by me or distortions of diagnostic images or specimens that may result from electronic transmissions. I understand that the practice of medicine is not an exact science and that Practitioner makes no warranties or guarantees regarding treatment outcomes. I acknowledge that I will receive a copy of this consent concurrently upon execution via email to the email address I last provided but may also request a printed copy by calling the office of [facility].    I understand that my insurance will be billed for this visit.   I have read or had this consent read to me.  I understand the contents of this consent, which  adequately explains the benefits and risks of the Services being provided via telemedicine.   I have been provided ample opportunity to ask questions regarding this consent and the Services and have had my questions answered to my satisfaction.  I give my informed consent for the services to be provided through the use of telemedicine in my medical care  By participating in this telemedicine visit I agree to the above.

## 2019-03-15 NOTE — Progress Notes (Signed)
I have reviewed the results and agree with this plan   

## 2019-03-15 NOTE — Patient Instructions (Addendum)
Pre visit review using our clinic review tool, if applicable. No additional management support is needed unless otherwise documented below in the visit note.  Continue to take 1/2 tablet everyday except 1 tablet on Mondays.  Recheck in  4 weeks.

## 2019-03-16 NOTE — Progress Notes (Signed)
I have reviewed the results and agree with this plan   

## 2019-03-21 ENCOUNTER — Other Ambulatory Visit: Payer: Self-pay

## 2019-03-21 ENCOUNTER — Telehealth (INDEPENDENT_AMBULATORY_CARE_PROVIDER_SITE_OTHER): Payer: Medicare Other | Admitting: Internal Medicine

## 2019-03-21 DIAGNOSIS — I480 Paroxysmal atrial fibrillation: Secondary | ICD-10-CM

## 2019-03-21 DIAGNOSIS — Z950 Presence of cardiac pacemaker: Secondary | ICD-10-CM | POA: Diagnosis not present

## 2019-03-21 DIAGNOSIS — I10 Essential (primary) hypertension: Secondary | ICD-10-CM | POA: Diagnosis not present

## 2019-03-21 NOTE — Progress Notes (Signed)
Electrophysiology TeleHealth Note   Due to national recommendations of social distancing due to COVID 19, an audio/video telehealth visit is felt to be most appropriate for this patient at this time.  See MyChart message from today for the patient's consent to telehealth for [facility].   Date:  03/21/2019   ID:  Molly [name], DOB [birth year], MRN [id number]  Location: patient's home  Provider location: [address] [place]  Evaluation Performed: Follow-up visit  PCP:  Martinique, Betty G, MD  Cardiologist:  Fransico Him, MD  Electrophysiologist:  Dr Rayann Heman  Chief Complaint:  "I'm doing ok."  History of Present Illness:    Molly [name] is a 85 y.o. female who presents via audio/video conferencing for a telehealth visit today. She is a pleasant 85 yo woman with PAF who has been fairly well controlled on dofetilide. She developed a rash of uncertain etiology and was placed on prednisone. Her atrial fib worsened. She has weaned down off the prednisone and her atrial fib has improved. She denies chest pain or sob or edema. She is active but has trouble walking after having a couple of toes amputated a couple of years ago. Since last being seen in our clinic, the patient reports doing very well .  Today, she denies symptoms of palpitations, chest pain, shortness of breath,  lower extremity edema, dizziness, presyncope, or syncope.  The patient is otherwise without complaint today.  The patient denies symptoms of fevers, chills, cough, or new SOB worrisome for COVID 19.  Past Medical History:  Diagnosis Date  . Aortic insufficiency    mild by echo 2017  . Cancer ([place])    Skin cancer- basal.  1 mylenoma  . Complication of anesthesia   . DCM (dilated cardiomyopathy) ([place])    EF 40-45% by echo 2017 - nuclear stress test with no ischemia  . GERD (gastroesophageal reflux disease)   . H/O benign essential tremor    on amiodarone resolved off amio  . H/O  hyperkalemia    Resolved off ACE inhibitors  . High cholesterol   . History of blood transfusion   . Hyperparathyroidism ([place])   . Hypertension   . Mitral regurgitation    mild to moderate by echo 2017  . Persistent atrial fibrillation   . PONV (postoperative nausea and vomiting)   . PVC's (premature ventricular contractions)   . Renal insufficiency    MILD  . Shortness of breath    with exertion  . Tachycardia-bradycardia syndrome [facility])    s/p PPM 02/2013    Past Surgical History:  Procedure Laterality Date  . AMPUTATION Right 11/27/2014   Procedure: RIGHT 2ND AND 3RD TOE AMPUTATION;  Surgeon: Wylene Simmer, MD;  Location: [place];  Service: Orthopedics;  Laterality: Right;  . BUNIONECTOMY     Right  . CARDIAC CATHETERIZATION  09/25/2000   EF of 50% -- with normal left ventricular size and function  . CARDIOVERSION N/A 12/28/2017   Procedure: CARDIOVERSION;  Surgeon: Dorothy Spark, MD;  Location: [facility] ENDOSCOPY;  Service: Cardiovascular;  Laterality: N/A;  . CARDIOVERSION N/A 02/25/2018   Procedure: CARDIOVERSION;  Surgeon: Skeet Latch, MD;  Location: [place];  Service: Cardiovascular;  Laterality: N/A;  . CESAREAN SECTION    . CESAREAN SECTION    . EYE SURGERY Bilateral    Cataract  . FOOT SURGERY    . INSERT / REPLACE / REMOVE PACEMAKER  02/2013  . PACEMAKER INSERTION  02/2013  .  PERMANENT PACEMAKER INSERTION N/A 03/02/2013   Procedure: PERMANENT PACEMAKER INSERTION;  Surgeon: Evans Lance, MD;  Location: [facility] CATH LAB;  Service: Cardiovascular;  Laterality: N/A;  . SHOULDER ARTHROSCOPY W/ ROTATOR CUFF REPAIR    . SHOULDER SURGERY Right    roto cuff  . TOE AMPUTATION Left    2nd and 3rd, bunion under toes.  . TONSILLECTOMY    . VARICOSE VEIN SURGERY      Current Outpatient Medications  Medication Sig Dispense Refill  . acetaminophen (TYLENOL) 500 MG tablet Take 1,000 mg by mouth every 6 (six) hours as needed. For pain    . carvedilol (COREG) 3.125 MG tablet TAKE  1 TABLET TWICE A DAY WITH A MEAL 180 tablet 3  . dofetilide (TIKOSYN) 250 MCG capsule TAKE ONE CAPSULE (250MG  TOTAL) BY MOUTH TWO TIMES DAILY 180 capsule 3  . dorzolamide (TRUSOPT) 2 % ophthalmic solution Place 1 drop into the right eye 2 (two) times daily.    . famotidine (PEPCID) 20 MG tablet TAKE 1 TABLET (20 MG TOTAL) BY MOUTH 2 (TWO) TIMES DAILY FOR 30 DAYS. 60 tablet 0  . LORazepam (ATIVAN) 0.5 MG tablet Take 0.5 mg by mouth at bedtime.    . Magnesium Oxide 500 MG TABS Take 0.5 tablets (250 mg total) by mouth daily. 15 tablet 6  . Multiple Vitamin (MULTIVITAMIN WITH MINERALS) TABS Take 1 tablet by mouth daily.    . predniSONE (DELTASONE) 20 MG tablet 2 tabs for 5 days, 1 tabs for 3 days, 1/2 tabs for 3 days.Take tables together with breakfast. 20 tablet 0  . simvastatin (ZOCOR) 20 MG tablet Take 20 mg by mouth daily at 6 PM.     . spironolactone (ALDACTONE) 25 MG tablet Take 0.5 tablets (12.5 mg total) by mouth daily. 45 tablet 3  . Triamcinolone Acetonide (TRIAMCINOLONE 0.1 % CREAM : EUCERIN) CREA Apply 1 application topically 2 (two) times daily as needed for rash or itching. 500 each 1  . warfarin (COUMADIN) 5 MG tablet TAKE 1/2 TO 1 TABLET DAILY AS DIRECTED BY ANTICOAGULATION CLINIC 90 tablet 1   No current facility-administered medications for this visit.     Allergies:   Codeine and Tramadol   Social History:  The patient  reports that she has never smoked. She has never used smokeless tobacco. She reports that she does not drink alcohol or use drugs.   Family History:  The patient's  family history includes Hypertension in her father and mother.   ROS:  Please see the history of present illness.   All other systems are personally reviewed and negative.    Exam:    Vital Signs:  BP - 115/87, P - 82, Wt. - 184 lbs   Labs/Other Tests and Data Reviewed:    Recent Labs: 09/26/2018: TSH 3.750 11/07/2018: Magnesium 1.7 01/24/2019: BUN 32; Creatinine, Ser 1.04; Hemoglobin 10.4;  NT-Pro BNP 443; Platelets 248; Potassium 5.4; Sodium 138   Wt Readings from Last 3 Encounters:  02/15/19 187 lb (84.8 kg)  02/10/19 177 lb (80.3 kg)  01/23/19 187 lb (84.8 kg)     Other studies personally reviewed: Last device remote is reviewed from [place] PDF dated 02/23/19 which reveals normal device function, no arrhythmias except for PAF   ASSESSMENT & PLAN:    1.  PAF - her symptoms are reasonably well controlled. She has been out of rhythm 6% of the time. 2. PPM - her [facility] device is working normally. 3. Chronic systolic/diastolic heart  failure - her sympotms are class 2. She will continue her current meds. 4. COVID 19 screen The patient denies symptoms of COVID 19 at this time.  The importance of social distancing was discussed today.  Follow-up:  6 months Next remote: 2 months  Current medicines are reviewed at length with the patient today.   The patient does not have concerns regarding her medicines.  The following changes were made today:  none  Labs/ tests ordered today include: none No orders of the defined types were placed in this encounter.    Patient Risk:  after full review of this patients clinical status, I feel that they are at moderate risk at this time.  Today, I have spent 15 minutes with the patient with telehealth technology discussing all of the above.    Signed, [name], MD  03/21/2019 10:01 AM     [facility] [address] [place] [address] [phone] (office) [phone] (fax)

## 2019-03-24 ENCOUNTER — Ambulatory Visit: Payer: Medicare Other | Admitting: Family Medicine

## 2019-03-27 ENCOUNTER — Ambulatory Visit (INDEPENDENT_AMBULATORY_CARE_PROVIDER_SITE_OTHER): Payer: Medicare Other | Admitting: *Deleted

## 2019-03-27 DIAGNOSIS — I495 Sick sinus syndrome: Secondary | ICD-10-CM | POA: Diagnosis not present

## 2019-03-28 LAB — CUP PACEART REMOTE DEVICE CHECK
Battery Remaining Longevity: 84 mo
Battery Remaining Percentage: 88 %
Brady Statistic RA Percent Paced: 78 %
Brady Statistic RV Percent Paced: 15 %
Date Time Interrogation Session: 20200601064100
Implantable Lead Implant Date: 20140508
Implantable Lead Implant Date: 20140508
Implantable Lead Location: 753859
Implantable Lead Location: 753860
Implantable Lead Model: 4135
Implantable Lead Model: 4136
Implantable Lead Serial Number: 29333020
Implantable Lead Serial Number: 29379474
Implantable Pulse Generator Implant Date: 20140508
Lead Channel Impedance Value: 609 Ohm
Lead Channel Impedance Value: 681 Ohm
Lead Channel Pacing Threshold Amplitude: 0.9 V
Lead Channel Pacing Threshold Pulse Width: 0.4 ms
Lead Channel Setting Pacing Amplitude: 2 V
Lead Channel Setting Pacing Amplitude: 2.4 V
Lead Channel Setting Pacing Pulse Width: 0.4 ms
Lead Channel Setting Sensing Sensitivity: 2.5 mV
Pulse Gen Serial Number: 112392

## 2019-03-31 ENCOUNTER — Other Ambulatory Visit: Payer: Self-pay | Admitting: Family Medicine

## 2019-03-31 DIAGNOSIS — L509 Urticaria, unspecified: Secondary | ICD-10-CM

## 2019-03-31 DIAGNOSIS — L299 Pruritus, unspecified: Secondary | ICD-10-CM

## 2019-04-03 ENCOUNTER — Encounter: Payer: Self-pay | Admitting: Cardiology

## 2019-04-03 NOTE — Progress Notes (Signed)
Remote pacemaker transmission.   

## 2019-04-11 ENCOUNTER — Telehealth (HOSPITAL_COMMUNITY): Payer: Self-pay | Admitting: *Deleted

## 2019-04-11 NOTE — Telephone Encounter (Signed)
Pt not feeling well going to send a remote transmission to see if she's out of rhythm. I will be back in touch with patient once transmission reviewed.

## 2019-04-11 NOTE — Telephone Encounter (Signed)
Spoke w/ pt and she stated that she thought she sent a remote transmission. Informed her that the transmission was not received. She initiated  transmission at this time. I informed her that if the transmission is not received I will call her back. Pt verbalized understanding.

## 2019-04-11 NOTE — Telephone Encounter (Signed)
Patient with BPs in the 80-100/50s just feels bad all the time, dizziness, no energy. Discussed with Roderic Palau NP - will hold spironolactone and increase water intake - call tomorrow with update. Pt in agreement.

## 2019-04-11 NOTE — Telephone Encounter (Signed)
Transmission reviewed. Pt in AF with RVR, episode started 04/10/19.  Will route back to AF clinic.  Chanetta Marshall, NP 04/11/2019 3:55 PM

## 2019-04-12 ENCOUNTER — Ambulatory Visit: Payer: Medicare Other

## 2019-04-12 ENCOUNTER — Other Ambulatory Visit: Payer: Self-pay

## 2019-04-12 ENCOUNTER — Ambulatory Visit (HOSPITAL_COMMUNITY)
Admission: RE | Admit: 2019-04-12 | Discharge: 2019-04-12 | Disposition: A | Discharge status: Home / Self Care | Payer: Medicare Other | Source: Ambulatory Visit | Attending: Nurse Practitioner | Admitting: Nurse Practitioner

## 2019-04-12 DIAGNOSIS — I480 Paroxysmal atrial fibrillation: Secondary | ICD-10-CM

## 2019-04-12 NOTE — Progress Notes (Signed)
Electrophysiology TeleHealth Note   Due to national recommendations of social distancing due to [place] 19, Audio/videotelehealth visit is felt to be most appropriate for this patient at this time.  See MyChart message/consent below from today for patient consent regarding telehealth for the At[facility] Fibrillation Clinic.    Date:  04/12/2019   ID:  Molly [name], DOB 24-Nov-1932, MRN [id number]  Location: home Provider location: [address] [place], [place] [address] Evaluation Performed: Follow up  PCP:  Martinique, Betty G, MD  Primary Cardiologist:   Primary Electrophysiologist: Dr. [name]  XN:ATFT   History of Present Illness: Molly [name] is a 85 y.o. female who presents via audio conferencing for a telehealth visit today.    The pt had return of symptomatic afib  about the time that she became very uncomfortable with a generalized rash. When I talked to her last she was feeling improved with the rash and was able to sleep. She was scheduled to send in a PPM report mid week but felt so symptomatic Friday, she went ahead and sent in a report. The first report showed afib but the second report showed SR. Today, she feels better than Friday, although she still has some dizziness and fatigue. She is tapering off prednisone and will be finished tomorrow.the rash is gone and she is not having any further itching.  F/u telephone virtual visit 5/4.She sent a report 4/30 and since the last report her afib burden seems to have been reduced. She has not noted any afib for a week. However, the rash has returned since coming off prednisone. She has a f/u with her dermatologist next week. She has had her rash for months. The only med change she can remember is that she changed form RX magnesium at 500 mg a day for diarrhea and went to 250 mg OTC, ? Allergic to a filler in the tablet?  F/u in afib clinic for a virtual visit 6/17 for feeling bad.. Pt sent a transmission yesterday as she felt  just awful on Friday and Saturday mostly. It showed that she was in rhythm at that time but was in perisitent afib when she sent report since Monday. Her afib burden is usually around 5-6 %. She reports that every day that she feels just terrible, no energy, and is wiped out just by getting her shower. Her BP this am was 94/60 and felt very dizzy. I told her yesterday when she reported low BP's to stop spironolactone and drink extra water. BP's  ths pm around 732 systolic. She continues on Tikosyn and low dose BB.She continues with a rash and just came off another taper a week ago.  Her dermatologist took bx's but it did not show a definite dx and he is not sure why she has it.Her HR's this pm in the 50-70's.  Today, she denies symptoms of palpitations, chest pain, shortness of breath, orthopnea, PND, lower extremity edema, claudication,  presyncope, syncope, bleeding, or neurologic sequela. + for dizziness, fatigue.The patient is tolerating medications without difficulties and is otherwise without complaint today.   she denies symptoms of cough, fevers, chills, or new SOB worrisome for COVID 19.      she has a BMI of There is no height or weight on file to calculate BMI.. There were no vitals filed for this visit.  Past Medical History:  Diagnosis Date  . Aortic insufficiency    mild by echo 2017  . Cancer ([place])  Skin cancer- basal.  1 mylenoma  . Complication of anesthesia   . DCM (dilated cardiomyopathy) ([place])    EF 40-45% by echo 2017 - nuclear stress test with no ischemia  . GERD (gastroesophageal reflux disease)   . H/O benign essential tremor    on amiodarone resolved off amio  . H/O hyperkalemia    Resolved off ACE inhibitors  . High cholesterol   . History of blood transfusion   . Hyperparathyroidism ([place])   . Hypertension   . Mitral regurgitation    mild to moderate by echo 2017  . Persistent atrial fibrillation   . PONV (postoperative nausea and vomiting)   . PVC's  (premature ventricular contractions)   . Renal insufficiency    MILD  . Shortness of breath    with exertion  . Tachycardia-bradycardia syndrome [facility])    s/p PPM 02/2013   Past Surgical History:  Procedure Laterality Date  . AMPUTATION Right 11/27/2014   Procedure: RIGHT 2ND AND 3RD TOE AMPUTATION;  Surgeon: Wylene Simmer, MD;  Location: [place];  Service: Orthopedics;  Laterality: Right;  . BUNIONECTOMY     Right  . CARDIAC CATHETERIZATION  09/25/2000   EF of 50% -- with normal left ventricular size and function  . CARDIOVERSION N/A 12/28/2017   Procedure: CARDIOVERSION;  Surgeon: Dorothy Spark, MD;  Location: [facility] ENDOSCOPY;  Service: Cardiovascular;  Laterality: N/A;  . CARDIOVERSION N/A 02/25/2018   Procedure: CARDIOVERSION;  Surgeon: Skeet Latch, MD;  Location: [place];  Service: Cardiovascular;  Laterality: N/A;  . CESAREAN SECTION    . CESAREAN SECTION    . EYE SURGERY Bilateral    Cataract  . FOOT SURGERY    . INSERT / REPLACE / REMOVE PACEMAKER  02/2013  . PACEMAKER INSERTION  02/2013  . PERMANENT PACEMAKER INSERTION N/A 03/02/2013   Procedure: PERMANENT PACEMAKER INSERTION;  Surgeon: Evans Lance, MD;  Location[facility] CATH LAB;  Service: Cardiovascular;  Laterality: N/A;  . SHOULDER ARTHROSCOPY W/ ROTATOR CUFF REPAIR    . SHOULDER SURGERY Right    roto cuff  . TOE AMPUTATION Left    2nd and 3rd, bunion under toes.  . TONSILLECTOMY    . VARICOSE VEIN SURGERY       Current Outpatient Medications  Medication Sig Dispense Refill  . acetaminophen (TYLENOL) 500 MG tablet Take 1,000 mg by mouth every 6 (six) hours as needed. For pain    . carvedilol (COREG) 3.125 MG tablet TAKE 1 TABLET TWICE A DAY WITH A MEAL 180 tablet 3  . dofetilide (TIKOSYN) 250 MCG capsule TAKE ONE CAPSULE (250MG  TOTAL) BY MOUTH TWO TIMES DAILY 180 capsule 3  . dorzolamide (TRUSOPT) 2 % ophthalmic solution Place 1 drop into the right eye 2 (two) times daily.    . famotidine (PEPCID) 20 MG tablet  TAKE 1 TABLET (20 MG TOTAL) BY MOUTH 2 (TWO) TIMES DAILY FOR 30 DAYS. 60 tablet 0  . LORazepam (ATIVAN) 0.5 MG tablet Take 0.5 mg by mouth at bedtime.    . Magnesium Oxide 500 MG TABS Take 0.5 tablets (250 mg total) by mouth daily. 15 tablet 6  . Multiple Vitamin (MULTIVITAMIN WITH MINERALS) TABS Take 1 tablet by mouth daily.    . predniSONE (DELTASONE) 20 MG tablet 2 tabs for 5 days, 1 tabs for 3 days, 1/2 tabs for 3 days.Take tables together with breakfast. 20 tablet 0  . simvastatin (ZOCOR) 20 MG tablet Take 20 mg by mouth daily at 6 PM.     .  spironolactone (ALDACTONE) 25 MG tablet Take 0.5 tablets (12.5 mg total) by mouth daily. 45 tablet 3  . Triamcinolone Acetonide (TRIAMCINOLONE 0.1 % CREAM : EUCERIN) CREA Apply 1 application topically 2 (two) times daily as needed for rash or itching. 500 each 1  . warfarin (COUMADIN) 5 MG tablet TAKE 1/2 TO 1 TABLET DAILY AS DIRECTED BY ANTICOAGULATION CLINIC 90 tablet 1   No current facility-administered medications for this encounter.     Allergies:   Codeine and Tramadol   Social History:  The patient  reports that she has never smoked. She has never used smokeless tobacco. She reports that she does not drink alcohol or use drugs.   Family History:  The patient's  family history includes Hypertension in her father and mother.    ROS:  Please see the history of present illness.   All other systems are personally reviewed and negative.   Exam: NA, telephone viisit  Recent Labs: 09/26/2018: TSH 3.750 11/07/2018: Magnesium 1.7 01/24/2019: BUN 32; Creatinine, Ser 1.04; Hemoglobin 10.4; NT-Pro BNP 443; Platelets 248; Potassium 5.4; Sodium 138  personally reviewed    Other studies personally reviewed: Epic records reviewed    ASSESSMENT AND PLAN:  1.  Paroxysmal  atrial fibrillation Her overall burden is low but it appears that she feels bad everyday with no energy, out of proportion to afib.She felt  terrible last Friday but according to  device cliinic she dd not have afib until Monday  2. Rash Resolved with prednisone but now back with coming off Derm does not know definite reason  Is present now but not as bothersome to her at present  3. Fatigue Longstanding Feels wiped out after taking daily shower Anginal equivalent? Consider stress test, update echo  4. Intermittent hypotension  associated with dizziness, increased fatigue I asked her to hold spironolactone for now  I have made her an appointment with R. Clinton, PA, in the afb clinic tomorrow as she feels that she needs to be seen in person I would consider gettng  Cbc, tsh, cmet,INR as she missed it today as she did not feel like getting out of the house, EKG I did not screen her for UTI symptoms but may be a possibly if any dysuria    COVID screen The patient does not have any symptoms that suggest any further testing/ screening at this time.  Social distancing reinforced today.   Follow-up: Appointment pending tommorrow  Current medicines are reviewed at length with the patient today.   The following changes were made today:  OTC to RX  mag  Labs/ tests ordered today include:  none No orders of the defined types were placed in this encounter.   Patient Risk:  after full review of this patients clinical status, I feel that they are at high  risk at this time.   Today, I have spent 15 minutes with the patient with telehealth technology discussing above  Signed, Roderic Palau NP  04/12/2019 3:17 PM  [facility] [address] [place], [place] [address] [phone]   I hereby voluntarily request, consent and authorize the [facility] and its employed or contracted physicians, physician assistants, nurse practitioners or other licensed health care professionals (the Practitioner), to provide me with telemedicine health care services (the "Services") as deemed necessary by the treating Practitioner. I  acknowledge and consent to receive the Services by the Practitioner via telemedicine. I understand that the telemedicine visit will involve communicating with the Practitioner  through live audiovisual communication technology and the disclosure of certain medical information by electronic transmission. I acknowledge that I have been given the opportunity to request an in-person assessment or other available alternative prior to the telemedicine visit and am voluntarily participating in the telemedicine visit.   I understand that I have the right to withhold or withdraw my consent to the use of telemedicine in the course of my care at any time, without affecting my right to future care or treatment, and that the Practitioner or I may terminate the telemedicine visit at any time. I understand that I have the right to inspect all information obtained and/or recorded in the course of the telemedicine visit and may receive copies of available information for a reasonable fee.  I understand that some of the potential risks of receiving the Services via telemedicine include:   Delay or interruption in medical evaluation due to technological equipment failure or disruption;  Information transmitted may not be sufficient (e.g. poor resolution of images) to allow for appropriate medical decision making by the Practitioner; and/or  In rare instances, security protocols could fail, causing a breach of personal health information.   Furthermore, I acknowledge that it is my responsibility to provide information about my medical history, conditions and care that is complete and accurate to the best of my ability. I acknowledge that Practitioner's advice, recommendations, and/or decision may be based on factors not within their control, such as incomplete or inaccurate data provided by me or distortions of diagnostic images or specimens that may result from electronic transmissions. I understand that the practice of medicine  is not an exact science and that Practitioner makes no warranties or guarantees regarding treatment outcomes. I acknowledge that I will receive a copy of this consent concurrently upon execution via email to the email address I last provided but may also request a printed copy by calling the office of the [facility].  I understand that my insurance will be billed for this visit.   I have read or had this consent read to me.  I understand the contents of this consent, which adequately explains the benefits and risks of the Services being provided via telemedicine.  I have been provided ample opportunity to ask questions regarding this consent and the Services and have had my questions answered to my satisfaction.  I give my informed consent for the services to be provided through the use of telemedicine in my medical care  By participating in this telemedicine visit I agree to the above.

## 2019-04-13 ENCOUNTER — Other Ambulatory Visit: Payer: Self-pay

## 2019-04-13 ENCOUNTER — Ambulatory Visit (HOSPITAL_COMMUNITY)
Admission: RE | Admit: 2019-04-13 | Discharge: 2019-04-13 | Disposition: A | Discharge status: Home / Self Care | Payer: Medicare Other | Source: Ambulatory Visit | Attending: Physician Assistant | Admitting: Physician Assistant

## 2019-04-13 VITALS — BP 126/72 | HR 70 | Ht 66.0 in | Wt 187.0 lb

## 2019-04-13 DIAGNOSIS — R0600 Dyspnea, unspecified: Secondary | ICD-10-CM | POA: Insufficient documentation

## 2019-04-13 DIAGNOSIS — R0609 Other forms of dyspnea: Secondary | ICD-10-CM | POA: Diagnosis not present

## 2019-04-13 DIAGNOSIS — Z85828 Personal history of other malignant neoplasm of skin: Secondary | ICD-10-CM | POA: Insufficient documentation

## 2019-04-13 DIAGNOSIS — Z7901 Long term (current) use of anticoagulants: Secondary | ICD-10-CM | POA: Diagnosis not present

## 2019-04-13 DIAGNOSIS — E213 Hyperparathyroidism, unspecified: Secondary | ICD-10-CM | POA: Diagnosis not present

## 2019-04-13 DIAGNOSIS — R5383 Other fatigue: Secondary | ICD-10-CM | POA: Diagnosis not present

## 2019-04-13 DIAGNOSIS — K219 Gastro-esophageal reflux disease without esophagitis: Secondary | ICD-10-CM | POA: Diagnosis not present

## 2019-04-13 DIAGNOSIS — Z950 Presence of cardiac pacemaker: Secondary | ICD-10-CM | POA: Insufficient documentation

## 2019-04-13 DIAGNOSIS — I4819 Other persistent atrial fibrillation: Secondary | ICD-10-CM | POA: Diagnosis not present

## 2019-04-13 DIAGNOSIS — I495 Sick sinus syndrome: Secondary | ICD-10-CM | POA: Insufficient documentation

## 2019-04-13 DIAGNOSIS — Z5181 Encounter for therapeutic drug level monitoring: Secondary | ICD-10-CM

## 2019-04-13 DIAGNOSIS — Z79899 Other long term (current) drug therapy: Secondary | ICD-10-CM | POA: Diagnosis not present

## 2019-04-13 DIAGNOSIS — Z8249 Family history of ischemic heart disease and other diseases of the circulatory system: Secondary | ICD-10-CM | POA: Insufficient documentation

## 2019-04-13 DIAGNOSIS — Z89421 Acquired absence of other right toe(s): Secondary | ICD-10-CM | POA: Insufficient documentation

## 2019-04-13 DIAGNOSIS — I10 Essential (primary) hypertension: Secondary | ICD-10-CM | POA: Insufficient documentation

## 2019-04-13 DIAGNOSIS — E7800 Pure hypercholesterolemia, unspecified: Secondary | ICD-10-CM | POA: Insufficient documentation

## 2019-04-13 LAB — BASIC METABOLIC PANEL
Anion gap: 8 (ref 5–15)
BUN: 27 mg/dL — ABNORMAL HIGH (ref 8–23)
CO2: 19 mmol/L — ABNORMAL LOW (ref 22–32)
Calcium: 10.8 mg/dL — ABNORMAL HIGH (ref 8.9–10.3)
Chloride: 111 mmol/L (ref 98–111)
Creatinine, Ser: 1.36 mg/dL — ABNORMAL HIGH (ref 0.44–1.00)
GFR calc Af Amer: 42 mL/min — ABNORMAL LOW (ref >60)
GFR calc non Af Amer: 36 mL/min — ABNORMAL LOW (ref >60)
Glucose, Bld: 137 mg/dL — ABNORMAL HIGH (ref 70–99)
Potassium: 5.1 mmol/L (ref 3.5–5.1)
Sodium: 138 mmol/L (ref 135–145)

## 2019-04-13 LAB — PROTIME-INR
INR: 2.4 — ABNORMAL HIGH (ref 0.8–1.2)
Prothrombin Time: 25.5 seconds — ABNORMAL HIGH (ref 11.4–15.2)

## 2019-04-13 LAB — CBC WITH DIFFERENTIAL/PLATELET
Abs Immature Granulocytes: 0.03 10*3/uL (ref 0.00–0.07)
Basophils Absolute: 0 10*3/uL (ref 0.0–0.1)
Basophils Relative: 0 %
Eosinophils Absolute: 0.2 10*3/uL (ref 0.0–0.5)
Eosinophils Relative: 2 %
HCT: 36.9 % (ref 36.0–46.0)
Hemoglobin: 11.2 g/dL — ABNORMAL LOW (ref 12.0–15.0)
Immature Granulocytes: 0 %
Lymphocytes Relative: 16 %
Lymphs Abs: 1.2 10*3/uL (ref 0.7–4.0)
MCH: 31.5 pg (ref 26.0–34.0)
MCHC: 30.4 g/dL (ref 30.0–36.0)
MCV: 103.9 fL — ABNORMAL HIGH (ref 80.0–100.0)
Monocytes Absolute: 0.6 10*3/uL (ref 0.1–1.0)
Monocytes Relative: 8 %
Neutro Abs: 5.5 10*3/uL (ref 1.7–7.7)
Neutrophils Relative %: 74 %
Platelets: 257 10*3/uL (ref 150–400)
RBC: 3.55 MIL/uL — ABNORMAL LOW (ref 3.87–5.11)
RDW: 15.1 % (ref 11.5–15.5)
WBC: 7.5 10*3/uL (ref 4.0–10.5)
nRBC: 0 % (ref 0.0–0.2)

## 2019-04-13 LAB — MAGNESIUM: Magnesium: 1.8 mg/dL (ref 1.7–2.4)

## 2019-04-13 LAB — TSH: TSH: 2.804 u[IU]/mL (ref 0.350–4.500)

## 2019-04-13 NOTE — Progress Notes (Signed)
Primary Care Physician: Martinique, Betty G, MD Referring Physician:Dr.Turner EP: Dr. [name] is a 85 y.o. female with a h/o afib on Tikosyn. It has been difficult for Dr. Radford Pax to keep her magnesium at 2 or above for continued Tikosyn use. With higher doses of magnesium, she developed significant diarrhea. Mag supplements were stopped one week ago and her mag yesterday was 1.8. She is in the afib clinic to discuss if she can safely stay on Tikosyn. She is very symptomatic in afib, having failed amiodarone and sotalol She is not the best candidate for ablation, therefore she has no options, she is staying in SR with tikosyn.   F/u in afib clinic, 11/13. For  the last week, she has been taking one mag supplement only. AShe has not had any diarrhea. I asked her  to hold PPI to see if mag absorption could be improved and she started having some indigestion symptoms and went back to 1/2 tablet a day. She remains in SR and will draw a mag level today.   F/u telephone virtual visit 5/4.She sent a report 4/30 and since the last report her afib burden seems to have been reduced. She has not noted any afib for a week. However, the rash has returned since coming off prednisone. She has a f/u with her dermatologist next week. She has had her rash for months. The only med change she can remember is that she changed form RX magnesium at 500 mg a day for diarrhea and went to 250 mg OTC, ? Allergic to a filler in the tablet?  F/u in afib clinic for a virtual visit 6/17 for feeling bad.. Pt sent a transmission yesterday as she felt just awful on Friday and Saturday mostly. It showed that she was in rhythm at that time but was in perisitent afib when she sent report since Monday. Her afib burden is usually around 5-6 %. She reports that every day that she feels just terrible, no energy, and is wiped out just by getting her shower. Her BP this am was 94/60 and felt very dizzy. I told her yesterday when  she reported low BP's to stop spironolactone and drink extra water. BP's  ths pm around 488 systolic. She continues on Tikosyn and low dose BB.She continues with a rash and just came off another taper a week ago.  Her dermatologist took bx's but it did not show a definite dx and he is not sure why she has it.Her HR's this pm in the 50-70's.  F/u AF clinic 04/14/19. Patient reports that she feels improved today although not back to her usual self. She is no longer on spironolactone. She is in A paced rhythm today. She denies polyuria or dysuria.   Today, she denies symptoms of palpitations, chest pain, shortness of breath, orthopnea, PND, lower extremity edema, presyncope, syncope, or neurologic sequela. The patient is tolerating medications without difficulties and is otherwise without complaint today. +fatigue  Past Medical History:  Diagnosis Date  . Aortic insufficiency    mild by echo 2017  . Cancer ([place])    Skin cancer- basal.  1 mylenoma  . Complication of anesthesia   . DCM (dilated cardiomyopathy) ([place])    EF 40-45% by echo 2017 - nuclear stress test with no ischemia  . GERD (gastroesophageal reflux disease)   . H/O benign essential tremor    on amiodarone resolved off amio  . H/O hyperkalemia    Resolved off ACE  inhibitors  . High cholesterol   . History of blood transfusion   . Hyperparathyroidism ([place])   . Hypertension   . Mitral regurgitation    mild to moderate by echo 2017  . Persistent atrial fibrillation   . PONV (postoperative nausea and vomiting)   . PVC's (premature ventricular contractions)   . Renal insufficiency    MILD  . Shortness of breath    with exertion  . Tachycardia-bradycardia syndrome [facility])    s/p PPM 02/2013   Past Surgical History:  Procedure Laterality Date  . AMPUTATION Right 11/27/2014   Procedure: RIGHT 2ND AND 3RD TOE AMPUTATION;  Surgeon: Wylene Simmer, MD;  Location: [place];  Service: Orthopedics;  Laterality: Right;  . BUNIONECTOMY     Right   . CARDIAC CATHETERIZATION  09/25/2000   EF of 50% -- with normal left ventricular size and function  . CARDIOVERSION N/A 12/28/2017   Procedure: CARDIOVERSION;  Surgeon: Dorothy Spark, MD;  Location: [facility] ENDOSCOPY;  Service: Cardiovascular;  Laterality: N/A;  . CARDIOVERSION N/A 02/25/2018   Procedure: CARDIOVERSION;  Surgeon: Skeet Latch, MD;  Location: [place];  Service: Cardiovascular;  Laterality: N/A;  . CESAREAN SECTION    . CESAREAN SECTION    . EYE SURGERY Bilateral    Cataract  . FOOT SURGERY    . INSERT / REPLACE / REMOVE PACEMAKER  02/2013  . PACEMAKER INSERTION  02/2013  . PERMANENT PACEMAKER INSERTION N/A 03/02/2013   Procedure: PERMANENT PACEMAKER INSERTION;  Surgeon: Evans Lance, MD;  Location: [facility] CATH LAB;  Service: Cardiovascular;  Laterality: N/A;  . SHOULDER ARTHROSCOPY W/ ROTATOR CUFF REPAIR    . SHOULDER SURGERY Right    roto cuff  . TOE AMPUTATION Left    2nd and 3rd, bunion under toes.  . TONSILLECTOMY    . VARICOSE VEIN SURGERY      Current Outpatient Medications  Medication Sig Dispense Refill  . acetaminophen (TYLENOL) 500 MG tablet Take 1,000 mg by mouth every 6 (six) hours as needed. For pain    . carvedilol (COREG) 3.125 MG tablet TAKE 1 TABLET TWICE A DAY WITH A MEAL 180 tablet 3  . dofetilide (TIKOSYN) 250 MCG capsule TAKE ONE CAPSULE (250MG  TOTAL) BY MOUTH TWO TIMES DAILY 180 capsule 3  . dorzolamide (TRUSOPT) 2 % ophthalmic solution Place 1 drop into the right eye 2 (two) times daily.    . famotidine (PEPCID) 20 MG tablet TAKE 1 TABLET (20 MG TOTAL) BY MOUTH 2 (TWO) TIMES DAILY FOR 30 DAYS. 60 tablet 0  . LORazepam (ATIVAN) 0.5 MG tablet Take 0.5 mg by mouth at bedtime.    . Magnesium Oxide 500 MG TABS Take 0.5 tablets (250 mg total) by mouth daily. 15 tablet 6  . Multiple Vitamin (MULTIVITAMIN WITH MINERALS) TABS Take 1 tablet by mouth daily.    . simvastatin (ZOCOR) 20 MG tablet Take 20 mg by mouth daily at 6 PM.     . Triamcinolone  Acetonide (TRIAMCINOLONE 0.1 % CREAM : EUCERIN) CREA Apply 1 application topically 2 (two) times daily as needed for rash or itching. 500 each 1  . warfarin (COUMADIN) 5 MG tablet TAKE 1/2 TO 1 TABLET DAILY AS DIRECTED BY ANTICOAGULATION CLINIC 90 tablet 1   No current facility-administered medications for this encounter.     Allergies  Allergen Reactions  . Codeine Nausea Only  . Tramadol Nausea Only    Social History   Socioeconomic History  . Marital status: Married  Spouse name: Not on file  . Number of children: 3  . Years of education: Not on file  . Highest education level: Not on file  Occupational History  . Not on file  Social Needs  . Financial resource strain: Not on file  . Food insecurity    Worry: Not on file    Inability: Not on file  . Transportation needs    Medical: Not on file    Non-medical: Not on file  Tobacco Use  . Smoking status: Never Smoker  . Smokeless tobacco: Never Used  Substance and Sexual Activity  . Alcohol use: No  . Drug use: No  . Sexual activity: Not Currently  Lifestyle  . Physical activity    Days per week: Not on file    Minutes per session: Not on file  . Stress: Not on file  Relationships  . Social Herbalist on phone: Not on file    Gets together: Not on file    Attends religious service: Not on file    Active member of club or organization: Not on file    Attends meetings of clubs or organizations: Not on file    Relationship status: Not on file  . Intimate partner violence    Fear of current or ex partner: Not on file    Emotionally abused: Not on file    Physically abused: Not on file    Forced sexual activity: Not on file  Other Topics Concern  . Not on file  Social History Narrative  . Not on file    Family History  Problem Relation Age of Onset  . Hypertension Mother   . Hypertension Father     ROS- All systems are reviewed and negative except as per the HPI above  Physical Exam:  Vitals:   04/13/19 1513  BP: 126/72  Pulse: 70  Weight: 84.8 kg  Height: 5\' 6"  (1.676 m)   Wt Readings from Last 3 Encounters:  04/13/19 84.8 kg  02/15/19 84.8 kg  02/10/19 80.3 kg    Labs: Lab Results  Component Value Date   NA 138 01/24/2019   K 5.4 (H) 01/24/2019   CL 103 01/24/2019   CO2 20 01/24/2019   GLUCOSE 126 (H) 01/24/2019   BUN 32 (H) 01/24/2019   CREATININE 1.04 (H) 01/24/2019   CALCIUM 10.7 (H) 01/24/2019   PHOS 3.2 02/23/2017   MG 1.7 11/07/2018   Lab Results  Component Value Date   INR 2.0 03/15/2019   Lab Results  Component Value Date   CHOL 156 05/23/2012   HDL 80 05/23/2012   LDLCALC 59 05/23/2012   TRIG 86 05/23/2012    GEN- The patient is well appearing elederly female, alert and oriented x 3 today.   HEENT-head normocephalic, atraumatic, sclera clear, conjunctiva pink, hearing intact, trachea midline. Lungs- Clear to ausculation bilaterally, normal work of breathing Heart- Regular rate and rhythm, no murmurs, rubs or gallops  GI- soft, NT, ND, + BS Extremities- no clubbing, cyanosis. Trace edema MS- no significant deformity or atrophy Skin- no rash or lesion Psych- euthymic mood, full affect Neuro- strength and sensation are intact   EKG- A paced with prolonged AV conduction, PR 286, LAD, slow R wave prog, QRS 122, QTc 447    Assessment and Plan: 1. Paroxysmal atrial fibrillation Overall burden is low and she is in A paced rhythm today. Continue dofetilide 250 mcg BID, QT stable. Bmet/mag/INR today. Continue warfarin Continue Coreg 3.125  mg BID  2. Dyspnea on exertion/Fatigue Out of proportion to afib burden. Could be anginal equivalent. Will proceed with stress testing and repeat echocardiogram. Will also check CBC, TSH  3. HTN Stable off spironolactone. No changes today.   Follow up in AF clinic after testing.   [facility] [address] [place], [place] [address]  [phone]

## 2019-04-13 NOTE — Patient Instructions (Signed)
Your physician has requested that you have a lexiscan myoview. For further information please visit [email or web address].  I have sent an order to [facility] office for this.  They will call you to schedule.    Your echo has been scheduled for May 02, 2019  Arrive at the same place you checked in today to check in for the echo at 2 p.m.

## 2019-04-18 ENCOUNTER — Telehealth (HOSPITAL_COMMUNITY): Payer: Self-pay | Admitting: *Deleted

## 2019-04-18 NOTE — Telephone Encounter (Signed)
Pt called in stating she is having a lot of family stress at the present time and wants to hold off on echo and stress testing. She feels slightly better off spironolactone but still endorses symptoms of fatigue and shortness of breath. Pt will call back when ready to proceed.

## 2019-04-19 ENCOUNTER — Ambulatory Visit: Payer: Medicare Other

## 2019-04-23 NOTE — Progress Notes (Signed)
HPI:   Molly [name] is a 85 y.o. female, who is here today to follow on labs done recently. She was last seen on 02/10/19. Since her last OV she has followed with cardiologist.  She had blood work recently, we are reviewing result today.  Today she  Is C/O lightheadedness, exacerbated by standing up. BP's "low." 90/60 and 80/60. Hx of HTN. Furosemide,spironolactone, and Cozaar have being discontinued since 08/2018.  Atrial fib s/p pacemaker placement. Currently she is on Coreg 3.125 mg bid.  She has not noted unusual headache,chest pain,dyspnea,diaphoresis,or syncope.  CKD III, she has not noted gross hematuria,decreased urine output,or foam in urine.  Lab Results  Component Value Date   CREATININE 1.36 (H) 04/13/2019   BUN 27 (H) 04/13/2019   NA 138 04/13/2019   K 5.1 04/13/2019   CL 111 04/13/2019   CO2 19 (L) 04/13/2019    Ca++ elevated at 10.8. Hypercalcemia since since 2016. She remembers following with endocrinologist years ago.  Lab Results  Component Value Date   WBC 7.5 04/13/2019   HGB 11.2 (L) 04/13/2019   HCT 36.9 04/13/2019   MCV 103.9 (H) 04/13/2019   PLT 257 04/13/2019   C/O fatigue. "No energy" for years now. Interrupted sleep. She gets up 3-4 times per night to urinate. Frustrated because she cannot keep up with chores around the house as she did before. Low back pain with prolonged standing or walking also contributes to this problem.  Denies dysuria,urgency,or worsening urinary frequency. Negative for fever,chills,or abnormal wt loss.  She is also c/o persistent skin rash. Seen on 25/20 and 02/10/19 for similar problem. Evaluated by dermatologist . According to pt,caused is unknown. She was started on Prednisone 10 mg and currently taking 5 mg daily. Rash and pruritus resolves when she was on Prednisone 10 mg. Prednisone 5 mg helps as well but still having pruritus than iterferes with sleep.   Anxiety,she is on Lorazepam  0.5 mg daily at bedtime prn. Negative for depressed mood. Tolerating medication well.   Review of Systems  Constitutional: Negative for activity change, appetite change, chills and fever.  HENT: Negative for nosebleeds and sore throat.   Eyes: Negative for redness and visual disturbance.  Gastrointestinal: Negative for abdominal pain, nausea and vomiting.  Endocrine: Negative for cold intolerance and heat intolerance.  Musculoskeletal: Positive for gait problem.  Skin: Negative for pallor and wound.  Neurological: Negative for facial asymmetry, speech difficulty and weakness.  Psychiatric/Behavioral: Positive for sleep disturbance. Negative for hallucinations.  Rest see pertinent positives and negatives per HPI.   Current Outpatient Medications on File Prior to Visit  Medication Sig Dispense Refill  . acetaminophen (TYLENOL) 500 MG tablet Take 1,000 mg by mouth every 6 (six) hours as needed. For pain    . carvedilol (COREG) 3.125 MG tablet TAKE 1 TABLET TWICE A DAY WITH A MEAL 180 tablet 3  . dofetilide (TIKOSYN) 250 MCG capsule TAKE ONE CAPSULE (250MG TOTAL) BY MOUTH TWO TIMES DAILY 180 capsule 3  . dorzolamide (TRUSOPT) 2 % ophthalmic solution Place 1 drop into the right eye 2 (two) times daily.    . famotidine (PEPCID) 20 MG tablet TAKE 1 TABLET (20 MG TOTAL) BY MOUTH 2 (TWO) TIMES DAILY FOR 30 DAYS. 60 tablet 0  . Magnesium Oxide 500 MG TABS Take 0.5 tablets (250 mg total) by mouth daily. 15 tablet 6  . Multiple Vitamin (MULTIVITAMIN WITH MINERALS) TABS Take 1 tablet by mouth daily.    Marland Kitchen  predniSONE (DELTASONE) 10 MG tablet Take 10 mg by mouth daily. Takes 1/2 pill    . simvastatin (ZOCOR) 20 MG tablet Take 20 mg by mouth daily at 6 PM.     . Triamcinolone Acetonide (TRIAMCINOLONE 0.1 % CREAM : EUCERIN) CREA Apply 1 application topically 2 (two) times daily as needed for rash or itching. 500 each 1  . warfarin (COUMADIN) 5 MG tablet TAKE 1/2 TO 1 TABLET DAILY AS DIRECTED BY  ANTICOAGULATION CLINIC 90 tablet 1   No current facility-administered medications on file prior to visit.      Past Medical History:  Diagnosis Date  . Aortic insufficiency    mild by echo 2017  . Cancer ([place])    Skin cancer- basal.  1 mylenoma  . Complication of anesthesia   . DCM (dilated cardiomyopathy) ([place])    EF 40-45% by echo 2017 - nuclear stress test with no ischemia  . GERD (gastroesophageal reflux disease)   . H/O benign essential tremor    on amiodarone resolved off amio  . H/O hyperkalemia    Resolved off ACE inhibitors  . High cholesterol   . History of blood transfusion   . Hyperparathyroidism ([place])   . Hypertension   . Mitral regurgitation    mild to moderate by echo 2017  . Persistent atrial fibrillation   . PONV (postoperative nausea and vomiting)   . PVC's (premature ventricular contractions)   . Renal insufficiency    MILD  . Shortness of breath    with exertion  . Tachycardia-bradycardia syndrome ([place])    s/p PPM 02/2013   Allergies  Allergen Reactions  . Codeine Nausea Only  . Tramadol Nausea Only    Social History   Socioeconomic History  . Marital status: Married    Spouse name: Not on file  . Number of children: 3  . Years of education: Not on file  . Highest education level: Not on file  Occupational History  . Not on file  Social Needs  . Financial resource strain: Not on file  . Food insecurity    Worry: Not on file    Inability: Not on file  . Transportation needs    Medical: Not on file    Non-medical: Not on file  Tobacco Use  . Smoking status: Never Smoker  . Smokeless tobacco: Never Used  Substance and Sexual Activity  . Alcohol use: No  . Drug use: No  . Sexual activity: Not Currently  Lifestyle  . Physical activity    Days per week: Not on file    Minutes per session: Not on file  . Stress: Not on file  Relationships  . Social Herbalist on phone: Not on file    Gets together: Not on file     Attends religious service: Not on file    Active member of club or organization: Not on file    Attends meetings of clubs or organizations: Not on file    Relationship status: Not on file  Other Topics Concern  . Not on file  Social History Narrative  . Not on file    Vitals:   04/24/19 1601  BP: 112/66  Pulse: 88  Resp: 12  Temp: 97.7 F (36.5 C)  SpO2: 97%   Body mass index is 30.26 kg/m.  Physical Exam  Nursing note and vitals reviewed. Constitutional: She is oriented to person, place, and time. She appears well-developed. No distress.  HENT:  Head: Normocephalic  and atraumatic.  Mouth/Throat: Oropharynx is clear and moist and mucous membranes are normal.  Eyes: Pupils are equal, round, and reactive to light. Conjunctivae are normal.  Cardiovascular: Normal rate and regular rhythm.  No murmur heard. Respiratory: Effort normal and breath sounds normal. No respiratory distress.  GI: Soft. She exhibits no mass. There is no abdominal tenderness.  Musculoskeletal:        General: No edema.  Lymphadenopathy:    She has no cervical adenopathy.  Neurological: She is alert and oriented to person, place, and time. She has normal strength. No cranial nerve deficit. Gait (Assisted by cane.) abnormal.  Skin: Skin is warm and dry. No rash noted. No erythema.  Psychiatric: Her mood appears anxious.  Well groomed, good eye contact.    ASSESSMENT AND PLAN:  Ms. Deanie was seen today for discuss recent labs and rash.  Diagnoses and all orders for this visit: Orders Placed This Encounter  Procedures  . Calcium, ionized  . Microalbumin / creatinine urine ratio  . Protein Electrophoresis,Random Urn  . VITAMIN D 25 Hydroxy (Vit-D Deficiency, Fractures)  . PTH, intact and calcium    Hypercalcemia Possible etiologies discussed,? Primary hypoparathyroidism. Ca++ levels have been stable  Further recommendations will be given according to lab results.   Skin pruritus Today  there is not rash. After discussion of some side effects of doxepin, she agrees to try and see if it helps with pruritus. We discussed possibility of interaction with her heart medications. Recommend stopping dietary changes currently using for a few weeks and see if it makes a difference in a skin rash/pruritus. Recommend letting cardiologist know about new medication. We may need to consider immunology referral.  -     doxepin (SINEQUAN) 10 MG/ML solution; Take 0.3-0.6 mLs (3-6 mg total) by mouth at bedtime.  Fatigue We discussed possible etiologies. Some of her chronic medical problems + medications could be contributing to problem. Further recommendation will be given according to lab results if needed.  Anxiety disorder, unspecified Problem is otherwise is stable. Side effects of Lorazepam discussed, no changes in current dose. Follow-up in 3 to 4 months, before if needed.  Essential hypertension Lightheadedness could be related to hypotension. Recommend continue monitoring BP. Coreg may need to be discontinued. Adequate hydration. Instructed about warning signs. Recommend arranging appointment with cardiologist.  CKD (chronic kidney disease), stage III ([place]) Cr 1.04 to 1.3, eGFR  High 30's to Mid 40's. Low salt diet to continue. Not on ACEI or ARB due to hypotension. Adequate hydration. Avoid NSAID's.   Return in about 4 months (around 08/24/2019) for anxiety.    Jeraldin Fesler G. Martinique, MD  [facility]. [place] office.

## 2019-04-24 ENCOUNTER — Other Ambulatory Visit: Payer: Self-pay

## 2019-04-24 ENCOUNTER — Ambulatory Visit (INDEPENDENT_AMBULATORY_CARE_PROVIDER_SITE_OTHER): Payer: Medicare Other | Admitting: Family Medicine

## 2019-04-24 ENCOUNTER — Encounter: Payer: Self-pay | Admitting: Family Medicine

## 2019-04-24 DIAGNOSIS — L299 Pruritus, unspecified: Secondary | ICD-10-CM

## 2019-04-24 DIAGNOSIS — I10 Essential (primary) hypertension: Secondary | ICD-10-CM

## 2019-04-24 DIAGNOSIS — F419 Anxiety disorder, unspecified: Secondary | ICD-10-CM | POA: Diagnosis not present

## 2019-04-24 DIAGNOSIS — N183 Chronic kidney disease, stage 3 unspecified: Secondary | ICD-10-CM | POA: Insufficient documentation

## 2019-04-24 DIAGNOSIS — R5383 Other fatigue: Secondary | ICD-10-CM

## 2019-04-24 MED ORDER — DOXEPIN HCL 10 MG/ML PO CONC: 3.0000 mg | Freq: Every day | ORAL | 0 refills | Status: DC

## 2019-04-24 MED ORDER — LORAZEPAM 0.5 MG PO TABS: 0.5000 mg | ORAL_TABLET | Freq: Every day | ORAL | 2 refills | Status: DC

## 2019-04-24 NOTE — Assessment & Plan Note (Signed)
We discussed possible etiologies. Some of her chronic medical problems + medications could be contributing to problem. Further recommendation will be given according to lab results if needed.

## 2019-04-24 NOTE — Assessment & Plan Note (Signed)
Lightheadedness could be related to hypotension. Recommend continue monitoring BP. Coreg may need to be discontinued. Adequate hydration. Instructed about warning signs. Recommend arranging appointment with cardiologist.

## 2019-04-24 NOTE — Patient Instructions (Addendum)
A few things to remember from today's visit:   Hypercalcemia - Plan: Protein Electrophoresis,Random Urn, Calcium, ionized, VITAMIN D 25 Hydroxy (Vit-D Deficiency, Fractures), PTH, intact and calcium  Essential hypertension  Anxiety disorder, unspecified type  CKD (chronic kidney disease), stage III ([place]) - Plan: VITAMIN D 25 Hydroxy (Vit-D Deficiency, Fractures), Microalbumin / creatinine urine ratio  Skin pruritus  Continue monitoring blood pressure at home, fall precautions and adequate hydration. Doxepin added today to help with itching at night, start with 0.3 mL and you can increase to 0.6 mL if needed.  It is a common symptom associated with multiple factors: psychologic,medications, systemic illness, sleep disorders,infections, and unknown causes. Some work-up can be done to evaluate for common causes as thyroid disease,anemia,diabetes, or abnormalities in calcium,potassium,or sodium. Regular physical activity as tolerated and a healthy diet is usually might help and usually recommended for chronic fatigue.   Please be sure medication list is accurate. If a new problem present, please set up appointment sooner than planned today.

## 2019-04-24 NOTE — Assessment & Plan Note (Signed)
Problem is otherwise is stable. Side effects of Lorazepam discussed, no changes in current dose. Follow-up in 3 to 4 months, before if needed.

## 2019-04-24 NOTE — Assessment & Plan Note (Signed)
Cr 1.04 to 1.3, eGFR  High 30's to Mid 40's. Low salt diet to continue. Not on ACEI or ARB due to hypotension. Adequate hydration. Avoid NSAID's.

## 2019-04-25 ENCOUNTER — Other Ambulatory Visit: Payer: Medicare Other

## 2019-04-26 ENCOUNTER — Other Ambulatory Visit (INDEPENDENT_AMBULATORY_CARE_PROVIDER_SITE_OTHER): Payer: Medicare Other

## 2019-04-26 ENCOUNTER — Other Ambulatory Visit: Payer: Self-pay

## 2019-04-26 DIAGNOSIS — N183 Chronic kidney disease, stage 3 unspecified: Secondary | ICD-10-CM

## 2019-04-26 LAB — VITAMIN D 25 HYDROXY (VIT D DEFICIENCY, FRACTURES): VITD: 24.18 ng/mL — ABNORMAL LOW (ref 30.00–100.00)

## 2019-04-26 LAB — MICROALBUMIN / CREATININE URINE RATIO
Creatinine,U: 81.6 mg/dL
Microalb Creat Ratio: 4.9 mg/g (ref 0.0–30.0)
Microalb, Ur: 4 mg/dL — ABNORMAL HIGH (ref 0.0–1.9)

## 2019-05-01 ENCOUNTER — Telehealth: Payer: Self-pay | Admitting: Cardiology

## 2019-05-01 ENCOUNTER — Other Ambulatory Visit (HOSPITAL_COMMUNITY): Payer: Medicare Other

## 2019-05-01 DIAGNOSIS — I4819 Other persistent atrial fibrillation: Secondary | ICD-10-CM

## 2019-05-01 MED ORDER — CARVEDILOL 3.125 MG PO TABS: ORAL_TABLET | ORAL | 3 refills | Status: DC

## 2019-05-01 NOTE — Telephone Encounter (Signed)
Spoke with the patient, she scheduled a visit with Dr. Radford Pax. I advised her to monitor her blood pressure and give [place] a call if things do not improve. She had no further questions.

## 2019-05-01 NOTE — Telephone Encounter (Signed)
° °  Pt c/o BP issue: STAT if pt c/o blurred vision, one-sided weakness or slurred speech  1. What are your last 5 BP readings?  81/68 HR 58 85/69 HR 89 88/55 HR 59 116/72 HR 51   2. Are you having any other symptoms (ex. Dizziness, headache, blurred vision, passed out)?  Dizziness, headache   3. What is your BP issue? Up and down BP. Patient feels miserable.  Patient wants to know what to do to make BP constant

## 2019-05-01 NOTE — Telephone Encounter (Signed)
Discussed with Roderic Palau NP - main issue currently is BP would decrease coreg to 1/2 tablet twice a day see if this helps with her BP. Since AF is currently controlled and hypotension/fatigue is main complaint pt really would like to see Dr. Radford Pax.   Pt would like to have telephone visit with Dr. Radford Pax in the next 1-2 weeks. Will send to Dr. Theodosia Blender nurse to help accommodate this.

## 2019-05-02 LAB — PTH, INTACT AND CALCIUM
Calcium: 10 mg/dL (ref 8.6–10.4)
PTH: 155 pg/mL — ABNORMAL HIGH (ref 14–64)

## 2019-05-02 LAB — CALCIUM, IONIZED: Calcium, Ion: 5.9 mg/dL — ABNORMAL HIGH (ref 4.8–5.6)

## 2019-05-02 LAB — PROTEIN ELECTROPHORESIS,RANDOM URN
Albumin: 47 %
Alpha-1-Globulin, U: 4 %
Alpha-2-Globulin, U: 11 %
Beta Globulin, U: 18 %
Creatinine, Urine: 84 mg/dL (ref 20–275)
Gamma Globulin, U: 20 %
Protein/Creat Ratio: 333 mg/g creat — ABNORMAL HIGH (ref 21–161)
Protein/Creatinine Ratio: 0.333 mg/mg creat — ABNORMAL HIGH (ref 0.021–0.16)
Total Protein, Urine: 28 mg/dL — ABNORMAL HIGH (ref 5–24)

## 2019-05-02 LAB — EXTRA SPECIMEN

## 2019-05-04 ENCOUNTER — Other Ambulatory Visit: Payer: Self-pay | Admitting: Family Medicine

## 2019-05-04 ENCOUNTER — Telehealth (HOSPITAL_COMMUNITY): Payer: Self-pay | Admitting: *Deleted

## 2019-05-04 ENCOUNTER — Telehealth: Payer: Self-pay | Admitting: *Deleted

## 2019-05-04 DIAGNOSIS — L509 Urticaria, unspecified: Secondary | ICD-10-CM

## 2019-05-04 DIAGNOSIS — L299 Pruritus, unspecified: Secondary | ICD-10-CM

## 2019-05-04 NOTE — Telephone Encounter (Signed)
Patient called in stating she felt terrible today - felt pretty good the last 2 days. BP today has ranged from 82/55-108/80 HR from 35-84. Per Roderic Palau NP have pt send device check to ensure rhythm. Will make further recommendations once device transmission seen.

## 2019-05-04 NOTE — Telephone Encounter (Signed)
Patient transmission reviewed with Molly Palau NP - Er precautions reviewed. Will try to increase coreg back to 3.125mg  twice a day with increased water intake to keep BP up. Appt to talk with DR. [name] regarding discussion for possible AV node ablation since no where to go with rate control due to hypotension. Pt in agreement with this plan.

## 2019-05-04 NOTE — Telephone Encounter (Signed)
Patient had labs done at cardiology office on 04/26/2019. Patient stated that labs were ordered by PCP and would like the results.  Copied from [place] [id number]. Topic: General - Inquiry >> May 02, 2019 11:51 AM Rayann Heman wrote: Reason for CRM: pt called and stated that she would like most recent lab work. Please advise >> May 04, 2019 11:20 AM Lennox Solders wrote: Pt would like blood work results from 04/26/2019

## 2019-05-04 NOTE — Telephone Encounter (Signed)
Transmission received and reviewed, presenting rhythm as of 05/04/19 at 14:55 shows 1:1 SVT at 173-177bpm. See episode list below--all available EGMs from today show 1:1 SVT/conducted AT. Marzetta Board, RN, made aware of the transmission results.

## 2019-05-05 ENCOUNTER — Ambulatory Visit: Payer: Self-pay | Admitting: Family Medicine

## 2019-05-05 NOTE — Telephone Encounter (Signed)
Lab results given to patient with verbal understanding.

## 2019-05-05 NOTE — Telephone Encounter (Signed)
Labs were already reviewed. A copy of lab results can be mailed to her address.  Thanks, BJ

## 2019-05-09 ENCOUNTER — Ambulatory Visit (INDEPENDENT_AMBULATORY_CARE_PROVIDER_SITE_OTHER): Payer: Medicare Other | Admitting: Internal Medicine

## 2019-05-09 ENCOUNTER — Other Ambulatory Visit: Payer: Self-pay

## 2019-05-09 ENCOUNTER — Encounter: Payer: Self-pay | Admitting: Internal Medicine

## 2019-05-09 ENCOUNTER — Ambulatory Visit (HOSPITAL_COMMUNITY): Payer: Medicare Other | Admitting: Nurse Practitioner

## 2019-05-09 ENCOUNTER — Telehealth: Payer: Self-pay | Admitting: Internal Medicine

## 2019-05-09 VITALS — BP 132/80 | HR 76 | Ht 66.0 in | Wt 192.0 lb

## 2019-05-09 DIAGNOSIS — I5042 Chronic combined systolic (congestive) and diastolic (congestive) heart failure: Secondary | ICD-10-CM | POA: Diagnosis not present

## 2019-05-09 DIAGNOSIS — I420 Dilated cardiomyopathy: Secondary | ICD-10-CM

## 2019-05-09 DIAGNOSIS — I4819 Other persistent atrial fibrillation: Secondary | ICD-10-CM | POA: Diagnosis not present

## 2019-05-09 DIAGNOSIS — Z950 Presence of cardiac pacemaker: Secondary | ICD-10-CM

## 2019-05-09 LAB — CUP PACEART INCLINIC DEVICE CHECK
Date Time Interrogation Session: 20200714040000
Implantable Lead Implant Date: 20140508
Implantable Lead Implant Date: 20140508
Implantable Lead Location: 753859
Implantable Lead Location: 753860
Implantable Lead Model: 4135
Implantable Lead Model: 4136
Implantable Lead Serial Number: 29333020
Implantable Lead Serial Number: 29379474
Implantable Pulse Generator Implant Date: 20140508
Lead Channel Impedance Value: 676 Ohm
Lead Channel Impedance Value: 692 Ohm
Lead Channel Pacing Threshold Amplitude: 0.7 V
Lead Channel Pacing Threshold Amplitude: 1 V
Lead Channel Pacing Threshold Pulse Width: 0.4 ms
Lead Channel Pacing Threshold Pulse Width: 0.4 ms
Lead Channel Sensing Intrinsic Amplitude: 18.6 mV
Lead Channel Sensing Intrinsic Amplitude: 2.2 mV
Lead Channel Setting Pacing Amplitude: 2 V
Lead Channel Setting Pacing Amplitude: 2.4 V
Lead Channel Setting Pacing Pulse Width: 0.4 ms
Lead Channel Setting Sensing Sensitivity: 2.5 mV
Pulse Gen Serial Number: 112392

## 2019-05-09 NOTE — Patient Instructions (Addendum)
Medication Instructions:  Your physician recommends that you continue on your current medications as directed. Please refer to the Current Medication list given to you today.  1. Stop taking simvastatin for one month. If after one month you feel better do not take this medication anymore. If you do NOT feel better restart the simvastatin.  2.  Coreg (carvedilol) instructions--if your systolic blood pressure (the top number when you check your blood pressure) is LESS than 100 DO NOT take your Coreg.  3.  Continue your dofetilide (Tikosyn) for now  Labwork: None ordered.  Testing/Procedures: None ordered.  Follow-Up: Your physician wants you to follow-up in: 4 months with Dr. [name].   You will receive a reminder letter in the mail two months in advance. If you don't receive a letter, please call our office to schedule the follow-up appointment.  Remote monitoring is used to monitor your Pacemaker from home. This monitoring reduces the number of office visits required to check your device to one time per year. It allows [place] to keep an eye on the functioning of your device to ensure it is working properly. You are scheduled for a device check from home on 06/26/2019. You may send your transmission at any time that day. If you have a wireless device, the transmission will be sent automatically. After your physician reviews your transmission, you will receive a postcard with your next transmission date.  Any Other Special Instructions Will Be Listed Below (If Applicable).  If you need a refill on your cardiac medications before your next appointment, please call your pharmacy.

## 2019-05-09 NOTE — Telephone Encounter (Signed)
Patient given lab results and recommendations and verbalized understanding. Lab results mailed to patient per patient request.

## 2019-05-09 NOTE — Telephone Encounter (Signed)
Pt reports that her cough is minimal and she has had it for a long time.. she has been feeling well and not exposed to anyone that has been sick.. no other SX noted..   Pt will wear her ask to her appt. Today.

## 2019-05-09 NOTE — Progress Notes (Signed)
HPI Mrs. Copland returns today to discuss her PAF. She is a pleasant 85 yo woman with chronic systolic heart failure, PAF, atrial tachycardia with a RVR, sinus node dysfunction, S/P PPM insertion. She has been bothered lately by palpitations which correlate to atrial tachy with a RVR. She has not had syncope. When she is in rhythm she feels fairly well.  Allergies  Allergen Reactions  . Codeine Nausea Only  . Tramadol Nausea Only     Current Outpatient Medications  Medication Sig Dispense Refill  . acetaminophen (TYLENOL) 500 MG tablet Take 1,000 mg by mouth every 6 (six) hours as needed. For pain    . carvedilol (COREG) 3.125 MG tablet Take 1/2 tablet twice a day 180 tablet 3  . dofetilide (TIKOSYN) 250 MCG capsule TAKE ONE CAPSULE (250MG  TOTAL) BY MOUTH TWO TIMES DAILY 180 capsule 3  . dorzolamide (TRUSOPT) 2 % ophthalmic solution Place 1 drop into the right eye 2 (two) times daily.    Marland Kitchen doxepin (SINEQUAN) 10 MG/ML solution Take 0.3-0.6 mLs (3-6 mg total) by mouth at bedtime. 20 mL 0  . famotidine (PEPCID) 20 MG tablet TAKE 1 TABLET (20 MG TOTAL) BY MOUTH 2 (TWO) TIMES DAILY FOR 30 DAYS. 60 tablet 0  . LORazepam (ATIVAN) 0.5 MG tablet Take 1 tablet (0.5 mg total) by mouth at bedtime. 30 tablet 2  . Magnesium Oxide 500 MG TABS Take 0.5 tablets (250 mg total) by mouth daily. 15 tablet 6  . Multiple Vitamin (MULTIVITAMIN WITH MINERALS) TABS Take 1 tablet by mouth daily.    . predniSONE (DELTASONE) 10 MG tablet Take 10 mg by mouth daily. Takes 1/2 pill    . Triamcinolone Acetonide (TRIAMCINOLONE 0.1 % CREAM : EUCERIN) CREA Apply 1 application topically 2 (two) times daily as needed for rash or itching. 500 each 1  . warfarin (COUMADIN) 5 MG tablet TAKE 1/2 TO 1 TABLET DAILY AS DIRECTED BY ANTICOAGULATION CLINIC 90 tablet 1   No current facility-administered medications for this visit.      Past Medical History:  Diagnosis Date  . Aortic insufficiency    mild by echo 2017  .  Cancer ([place])    Skin cancer- basal.  1 mylenoma  . Complication of anesthesia   . DCM (dilated cardiomyopathy) ([place])    EF 40-45% by echo 2017 - nuclear stress test with no ischemia  . GERD (gastroesophageal reflux disease)   . H/O benign essential tremor    on amiodarone resolved off amio  . H/O hyperkalemia    Resolved off ACE inhibitors  . High cholesterol   . History of blood transfusion   . Hyperparathyroidism ([place])   . Hypertension   . Mitral regurgitation    mild to moderate by echo 2017  . Persistent atrial fibrillation   . PONV (postoperative nausea and vomiting)   . PVC's (premature ventricular contractions)   . Renal insufficiency    MILD  . Shortness of breath    with exertion  . Tachycardia-bradycardia syndrome ([place])    s/p PPM 02/2013    ROS:   All systems reviewed and negative except as noted in the HPI.   Past Surgical History:  Procedure Laterality Date  . AMPUTATION Right 11/27/2014   Procedure: RIGHT 2ND AND 3RD TOE AMPUTATION;  Surgeon: Wylene Simmer, MD;  Location: [facility];  Service: Orthopedics;  Laterality: Right;  . BUNIONECTOMY     Right  . CARDIAC CATHETERIZATION  09/25/2000   EF of  50% -- with normal left ventricular size and function  . CARDIOVERSION N/A 12/28/2017   Procedure: CARDIOVERSION;  Surgeon: Dorothy Spark, MD;  Location: [facility] ENDOSCOPY;  Service: Cardiovascular;  Laterality: N/A;  . CARDIOVERSION N/A 02/25/2018   Procedure: CARDIOVERSION;  Surgeon: Skeet Latch, MD;  Location: [facility];  Service: Cardiovascular;  Laterality: N/A;  . CESAREAN SECTION    . CESAREAN SECTION    . EYE SURGERY Bilateral    Cataract  . FOOT SURGERY    . INSERT / REPLACE / REMOVE PACEMAKER  02/2013  . PACEMAKER INSERTION  02/2013  . PERMANENT PACEMAKER INSERTION N/A 03/02/2013   Procedure: PERMANENT PACEMAKER INSERTION;  Surgeon: Evans Lance, MD;  Location: [facility] CATH LAB;  Service: Cardiovascular;  Laterality: N/A;  . SHOULDER ARTHROSCOPY W/ ROTATOR  CUFF REPAIR    . SHOULDER SURGERY Right    roto cuff  . TOE AMPUTATION Left    2nd and 3rd, bunion under toes.  . TONSILLECTOMY    . VARICOSE VEIN SURGERY       Family History  Problem Relation Age of Onset  . Hypertension Mother   . Hypertension Father      Social History   Socioeconomic History  . Marital status: Married    Spouse name: Not on file  . Number of children: 3  . Years of education: Not on file  . Highest education level: Not on file  Occupational History  . Not on file  Social Needs  . Financial resource strain: Not on file  . Food insecurity    Worry: Not on file    Inability: Not on file  . Transportation needs    Medical: Not on file    Non-medical: Not on file  Tobacco Use  . Smoking status: Never Smoker  . Smokeless tobacco: Never Used  Substance and Sexual Activity  . Alcohol use: No  . Drug use: No  . Sexual activity: Not Currently  Lifestyle  . Physical activity    Days per week: Not on file    Minutes per session: Not on file  . Stress: Not on file  Relationships  . Social Herbalist on phone: Not on file    Gets together: Not on file    Attends religious service: Not on file    Active member of club or organization: Not on file    Attends meetings of clubs or organizations: Not on file    Relationship status: Not on file  . Intimate partner violence    Fear of current or ex partner: Not on file    Emotionally abused: Not on file    Physically abused: Not on file    Forced sexual activity: Not on file  Other Topics Concern  . Not on file  Social History Narrative  . Not on file     BP 132/80   Pulse 76   Ht 5\' 6"  (1.676 m)   Wt 192 lb (87.1 kg)   SpO2 96%   BMI 30.99 kg/m   Physical Exam:  Well appearing NAD HEENT: Unremarkable Neck:  No JVD, no thyromegally Lymphatics:  No adenopathy Back:  No CVA tenderness Lungs:  Clear with no wheezes HEART:  Regular rate rhythm, no murmurs, no rubs, no clicks  Abd:  soft, positive bowel sounds, no organomegally, no rebound, no guarding Ext:  2 plus pulses, no edema, no cyanosis, no clubbing Skin:  No rashes no nodules Neuro:  CN II through  XII intact, motor grossly intact  EKG - nsr with atrial pacing  DEVICE  Normal device function.  See PaceArt for details.   Assess/Plan: 1. Sinus node dysfunction - she is asymptomatic s/p PPM insertion. 2. PAF - she has not had much in the way of atrial fib. She will continue dofetilide 3. Atrial tachy -this appears to be her biggest problem. She feels poorly when she goes into atrial tachycardia. I discussed the treatment options including catheter ablation. She would like to hold off on this for now. We discussed uptitration of her medical therapy. She is limited by fatigue and blood pressure.  4. PPM - her [employer] DDD PPM is working normally. We will recheck in several months.  Mikle Bosworth.D.

## 2019-05-09 NOTE — Telephone Encounter (Signed)
New Message        COVID-19 Pre-Screening Questions:   In the past 7 to 10 days have you had a cough,  shortness of breath, headache, congestion, fever (100 or greater) body aches, chills, sore throat, or sudden loss of taste or sense of smell? Some cough  Have you been around anyone with known Covid 19. NO  Have you been around anyone who is awaiting Covid 19 test results in the past 7 to 10 days? NO  Have you been around anyone who has been exposed to Covid 19, or has mentioned symptoms of Covid 19 within the past 7 to 10 days? NO  If you have any concerns/questions about symptoms patients report during screening (either on the phone or at threshold). Contact the provider seeing the patient or DOD for further guidance.  If neither are available contact a member of the leadership team.

## 2019-05-16 ENCOUNTER — Telehealth: Payer: Medicare Other | Admitting: Cardiology

## 2019-05-17 ENCOUNTER — Ambulatory Visit: Payer: Medicare Other

## 2019-05-20 ENCOUNTER — Other Ambulatory Visit: Payer: Self-pay | Admitting: Family Medicine

## 2019-05-20 DIAGNOSIS — L299 Pruritus, unspecified: Secondary | ICD-10-CM

## 2019-05-24 ENCOUNTER — Other Ambulatory Visit: Payer: Self-pay

## 2019-05-24 ENCOUNTER — Ambulatory Visit (INDEPENDENT_AMBULATORY_CARE_PROVIDER_SITE_OTHER): Payer: Medicare Other | Admitting: Internal Medicine

## 2019-05-24 ENCOUNTER — Encounter: Payer: Self-pay | Admitting: Internal Medicine

## 2019-05-24 ENCOUNTER — Ambulatory Visit: Payer: Medicare Other

## 2019-05-24 DIAGNOSIS — M25472 Effusion, left ankle: Secondary | ICD-10-CM | POA: Diagnosis not present

## 2019-05-24 DIAGNOSIS — I4819 Other persistent atrial fibrillation: Secondary | ICD-10-CM

## 2019-05-24 DIAGNOSIS — M25471 Effusion, right ankle: Secondary | ICD-10-CM | POA: Diagnosis not present

## 2019-05-24 DIAGNOSIS — R058 Other specified cough: Secondary | ICD-10-CM

## 2019-05-24 DIAGNOSIS — R05 Cough: Secondary | ICD-10-CM

## 2019-05-24 DIAGNOSIS — R5383 Other fatigue: Secondary | ICD-10-CM

## 2019-05-24 NOTE — Progress Notes (Signed)
Please call in am 7/30 and if still feeling bad then needs to go to ER for evaluation.  Needs COVID test

## 2019-05-24 NOTE — Progress Notes (Signed)
Virtual Visit via Telephone Note  I connected with@ on 05/24/19 at  3:30 PM EDT by telephone and verified that I am speaking with the correct person using two identifiers.   I discussed the limitations, risks, security and privacy concerns of performing an evaluation and management service by telephone and the availability of in person appointments. I also discussed with the patient that there may be a patient responsible charge related to this service. Multiple attempts  And phoine disconnection that finally  Connected off of cell phone     Location patient: home Location provider: workoffice Participants present for the call: patient, provider Patient did not have a visit in the prior 7 days to address this/these issue(s).   History of Present Illness: Molly [name]  Presents for phone viist   pcp NA? For sx  Onset soret htroat and then cough than has worsened dry and at night  Feels bad but no fever chills   covid exposures .   She was seen in a fib clinic  Dr [name]   In past month  . Has now been put back on   spriromnolactone  Again   Was on lasix in past but had ? Low bp?  She has noted  Some swelling more in past since her   Cards visit .  Has die but  Cannot say how mycy different   Is on anticoagulant but no bleeding   She is under care for a fib CM ht amonth other     husband is not ill  Observations/Objective: Patient sounds cheerful and well on the phone. I do not appreciate any SOB. Speech and thought processing are grossly intact. Patient reported vitals:  bp was 110/85 pulse 62    Lab Results  Component Value Date   WBC 7.5 04/13/2019   HGB 11.2 (L) 04/13/2019   HCT 36.9 04/13/2019   PLT 257 04/13/2019   GLUCOSE 137 (H) 04/13/2019   CHOL 156 05/23/2012   TRIG 86 05/23/2012   HDL 80 05/23/2012   LDLCALC 59 05/23/2012   ALT 13 (L) 02/23/2017   AST 19 02/23/2017   NA 138 04/13/2019   K 5.1 04/13/2019   CL 111 04/13/2019   CREATININE 1.36 (H) 04/13/2019   BUN 27 (H) 04/13/2019   CO2 19 (L) 04/13/2019   TSH 2.804 04/13/2019   INR 2.4 (H) 04/13/2019   HGBA1C 5.7 (H) 05/23/2012   MICROALBUR 4.0 (H) 04/26/2019    Assessment and Plan: Cough nocturnal  Persistent worsening  Edema by report  Uncertain if breathing status is worse or at baseline.  Uncertain if contagious  Although could be chf sx    Can try mucin ex and or mucinex dm ( cannot take codeine) advise ed visit pr pther if progressive shortness of breath a Advise weight daily and try lasix 20 qd x 2 days and contact cardiology team to discuss advisability of plan and let us know fu  pcp etc . Patient understandable reluctant to seek ed care  At this time    Has dying sister coming home .      Follow Up Instructions:  See above and have her contact her cards team with data  weights  Hx of swelling     May need hands on visit with  Her provider  Or other  But  We are not seeing cough related sx in our clinic  At this time. [id number] [phone]  I did not  refer this patient for an OV in the next 24 hours for this/these issue(s).  discussed the assessment and treatment plan with the patient. The patient was provided an opportunity to ask questions . The patient the plan and demonstrated an understanding of the instructions.   The patient was advised to call back or seek an in-person evaluation at appropriate place  if the symptoms worsen or if the condition fails to improve as anticipated.  I provided 18  minutes of non-face-to-face time during this encounter.   Shanon Ace, MD

## 2019-05-25 ENCOUNTER — Emergency Department (HOSPITAL_COMMUNITY): Payer: Medicare Other

## 2019-05-25 ENCOUNTER — Encounter (HOSPITAL_COMMUNITY): Payer: Self-pay | Admitting: Emergency Medicine

## 2019-05-25 ENCOUNTER — Telehealth: Payer: Self-pay | Admitting: Cardiology

## 2019-05-25 ENCOUNTER — Inpatient Hospital Stay (HOSPITAL_COMMUNITY)
Admission: EM | Admit: 2019-05-25 | Discharge: 2019-05-30 | DRG: 273 | Disposition: A | Discharge status: Home Health | Payer: Medicare Other | Attending: Internal Medicine | Admitting: Internal Medicine

## 2019-05-25 ENCOUNTER — Other Ambulatory Visit: Payer: Self-pay

## 2019-05-25 DIAGNOSIS — I482 Chronic atrial fibrillation, unspecified: Secondary | ICD-10-CM | POA: Diagnosis not present

## 2019-05-25 DIAGNOSIS — Z7952 Long term (current) use of systemic steroids: Secondary | ICD-10-CM | POA: Diagnosis not present

## 2019-05-25 DIAGNOSIS — Z89421 Acquired absence of other right toe(s): Secondary | ICD-10-CM | POA: Diagnosis not present

## 2019-05-25 DIAGNOSIS — Z7901 Long term (current) use of anticoagulants: Secondary | ICD-10-CM

## 2019-05-25 DIAGNOSIS — I428 Other cardiomyopathies: Secondary | ICD-10-CM

## 2019-05-25 DIAGNOSIS — I5043 Acute on chronic combined systolic (congestive) and diastolic (congestive) heart failure: Secondary | ICD-10-CM | POA: Diagnosis present

## 2019-05-25 DIAGNOSIS — R05 Cough: Secondary | ICD-10-CM | POA: Diagnosis not present

## 2019-05-25 DIAGNOSIS — Z79899 Other long term (current) drug therapy: Secondary | ICD-10-CM

## 2019-05-25 DIAGNOSIS — I4811 Longstanding persistent atrial fibrillation: Secondary | ICD-10-CM | POA: Diagnosis not present

## 2019-05-25 DIAGNOSIS — K219 Gastro-esophageal reflux disease without esophagitis: Secondary | ICD-10-CM | POA: Diagnosis present

## 2019-05-25 DIAGNOSIS — I340 Nonrheumatic mitral (valve) insufficiency: Secondary | ICD-10-CM | POA: Diagnosis not present

## 2019-05-25 DIAGNOSIS — E785 Hyperlipidemia, unspecified: Secondary | ICD-10-CM | POA: Diagnosis present

## 2019-05-25 DIAGNOSIS — I420 Dilated cardiomyopathy: Secondary | ICD-10-CM | POA: Diagnosis present

## 2019-05-25 DIAGNOSIS — I4819 Other persistent atrial fibrillation: Principal | ICD-10-CM | POA: Diagnosis present

## 2019-05-25 DIAGNOSIS — I471 Supraventricular tachycardia: Secondary | ICD-10-CM | POA: Diagnosis not present

## 2019-05-25 DIAGNOSIS — I495 Sick sinus syndrome: Secondary | ICD-10-CM | POA: Diagnosis not present

## 2019-05-25 DIAGNOSIS — I4891 Unspecified atrial fibrillation: Secondary | ICD-10-CM | POA: Diagnosis not present

## 2019-05-25 DIAGNOSIS — N179 Acute kidney failure, unspecified: Secondary | ICD-10-CM | POA: Diagnosis not present

## 2019-05-25 DIAGNOSIS — Z85828 Personal history of other malignant neoplasm of skin: Secondary | ICD-10-CM

## 2019-05-25 DIAGNOSIS — Z8249 Family history of ischemic heart disease and other diseases of the circulatory system: Secondary | ICD-10-CM | POA: Diagnosis not present

## 2019-05-25 DIAGNOSIS — T502X5A Adverse effect of carbonic-anhydrase inhibitors, benzothiadiazides and other diuretics, initial encounter: Secondary | ICD-10-CM | POA: Diagnosis not present

## 2019-05-25 DIAGNOSIS — Z20828 Contact with and (suspected) exposure to other viral communicable diseases: Secondary | ICD-10-CM | POA: Diagnosis present

## 2019-05-25 DIAGNOSIS — Z950 Presence of cardiac pacemaker: Secondary | ICD-10-CM | POA: Diagnosis not present

## 2019-05-25 DIAGNOSIS — I351 Nonrheumatic aortic (valve) insufficiency: Secondary | ICD-10-CM | POA: Diagnosis not present

## 2019-05-25 DIAGNOSIS — I480 Paroxysmal atrial fibrillation: Secondary | ICD-10-CM | POA: Diagnosis not present

## 2019-05-25 DIAGNOSIS — I080 Rheumatic disorders of both mitral and aortic valves: Secondary | ICD-10-CM | POA: Diagnosis present

## 2019-05-25 DIAGNOSIS — I110 Hypertensive heart disease with heart failure: Secondary | ICD-10-CM | POA: Diagnosis present

## 2019-05-25 DIAGNOSIS — Z885 Allergy status to narcotic agent status: Secondary | ICD-10-CM

## 2019-05-25 DIAGNOSIS — R059 Cough, unspecified: Secondary | ICD-10-CM

## 2019-05-25 DIAGNOSIS — I361 Nonrheumatic tricuspid (valve) insufficiency: Secondary | ICD-10-CM | POA: Diagnosis not present

## 2019-05-25 LAB — BASIC METABOLIC PANEL
Anion gap: 8 (ref 5–15)
BUN: 16 mg/dL (ref 8–23)
CO2: 21 mmol/L — ABNORMAL LOW (ref 22–32)
Calcium: 10.3 mg/dL (ref 8.9–10.3)
Chloride: 109 mmol/L (ref 98–111)
Creatinine, Ser: 1.29 mg/dL — ABNORMAL HIGH (ref 0.44–1.00)
GFR calc Af Amer: 44 mL/min — ABNORMAL LOW (ref >60)
GFR calc non Af Amer: 38 mL/min — ABNORMAL LOW (ref >60)
Glucose, Bld: 122 mg/dL — ABNORMAL HIGH (ref 70–99)
Potassium: 4.4 mmol/L (ref 3.5–5.1)
Sodium: 138 mmol/L (ref 135–145)

## 2019-05-25 LAB — CBC
HCT: 31.5 % — ABNORMAL LOW (ref 36.0–46.0)
Hemoglobin: 9.7 g/dL — ABNORMAL LOW (ref 12.0–15.0)
MCH: 30.5 pg (ref 26.0–34.0)
MCHC: 30.8 g/dL (ref 30.0–36.0)
MCV: 99.1 fL (ref 80.0–100.0)
Platelets: 243 10*3/uL (ref 150–400)
RBC: 3.18 MIL/uL — ABNORMAL LOW (ref 3.87–5.11)
RDW: 15.1 % (ref 11.5–15.5)
WBC: 8.3 10*3/uL (ref 4.0–10.5)
nRBC: 0 % (ref 0.0–0.2)

## 2019-05-25 LAB — SARS CORONAVIRUS 2 BY RT PCR (HOSPITAL ORDER, PERFORMED IN ~~LOC~~ HOSPITAL LAB): SARS Coronavirus 2: NEGATIVE

## 2019-05-25 LAB — APTT: aPTT: 37 seconds — ABNORMAL HIGH (ref 24–36)

## 2019-05-25 LAB — BRAIN NATRIURETIC PEPTIDE: B Natriuretic Peptide: 543.7 pg/mL — ABNORMAL HIGH (ref 0.0–100.0)

## 2019-05-25 LAB — PROTIME-INR
INR: 2.9 — ABNORMAL HIGH (ref 0.8–1.2)
Prothrombin Time: 29.7 seconds — ABNORMAL HIGH (ref 11.4–15.2)

## 2019-05-25 MED ORDER — CARVEDILOL 3.125 MG PO TABS
1.5625 mg | ORAL_TABLET | Freq: Two times a day (BID) | ORAL | Status: DC
  Administered 2019-05-26 – 2019-05-30 (×8): 1.5625 mg via ORAL
  Filled 2019-05-25 (×6): qty 1
  Filled 2019-05-25: qty 0.5
  Filled 2019-05-25 (×3): qty 1

## 2019-05-25 MED ORDER — LORAZEPAM 0.5 MG PO TABS
0.5000 mg | ORAL_TABLET | Freq: Every day | ORAL | Status: DC
  Administered 2019-05-25 – 2019-05-29 (×5): 0.5 mg via ORAL
  Filled 2019-05-25 (×5): qty 1

## 2019-05-25 MED ORDER — MAGNESIUM OXIDE 400 (241.3 MG) MG PO TABS
200.0000 mg | ORAL_TABLET | Freq: Every day | ORAL | Status: DC
  Administered 2019-05-25 – 2019-05-30 (×6): 200 mg via ORAL
  Filled 2019-05-25 (×7): qty 1

## 2019-05-25 MED ORDER — ACETAMINOPHEN 325 MG PO TABS
650.0000 mg | ORAL_TABLET | ORAL | Status: DC | PRN
  Administered 2019-05-25 – 2019-05-28 (×5): 650 mg via ORAL
  Filled 2019-05-25 (×5): qty 2

## 2019-05-25 MED ORDER — FUROSEMIDE 10 MG/ML IJ SOLN
40.0000 mg | Freq: Two times a day (BID) | INTRAMUSCULAR | Status: DC
  Administered 2019-05-25 – 2019-05-27 (×4): 40 mg via INTRAVENOUS
  Filled 2019-05-25 (×5): qty 4

## 2019-05-25 MED ORDER — ONDANSETRON HCL 4 MG/2ML IJ SOLN: 4.0000 mg | Freq: Four times a day (QID) | INTRAMUSCULAR | Status: DC | PRN

## 2019-05-25 MED ORDER — FAMOTIDINE 20 MG PO TABS
20.0000 mg | ORAL_TABLET | Freq: Two times a day (BID) | ORAL | Status: DC
  Administered 2019-05-25 – 2019-05-26 (×3): 20 mg via ORAL
  Filled 2019-05-25 (×3): qty 1

## 2019-05-25 MED ORDER — DORZOLAMIDE HCL 2 % OP SOLN
1.0000 [drp] | Freq: Two times a day (BID) | OPHTHALMIC | Status: DC
  Administered 2019-05-26 – 2019-05-30 (×9): 1 [drp] via OPHTHALMIC
  Filled 2019-05-25: qty 10

## 2019-05-25 MED ORDER — SODIUM CHLORIDE 0.9 % IV SOLN: 250.0000 mL | INTRAVENOUS | Status: DC | PRN

## 2019-05-25 MED ORDER — WARFARIN - PHYSICIAN DOSING INPATIENT
Freq: Every day | Status: DC
  Administered 2019-05-27 – 2019-05-28 (×2)

## 2019-05-25 MED ORDER — SODIUM CHLORIDE 0.9% FLUSH
3.0000 mL | Freq: Two times a day (BID) | INTRAVENOUS | Status: DC
  Administered 2019-05-25 – 2019-05-28 (×6): 3 mL via INTRAVENOUS

## 2019-05-25 MED ORDER — SODIUM CHLORIDE 0.9% FLUSH
3.0000 mL | INTRAVENOUS | Status: DC | PRN
  Administered 2019-05-25 (×2): 3 mL via INTRAVENOUS

## 2019-05-25 MED ORDER — WARFARIN SODIUM 2.5 MG PO TABS
2.5000 mg | ORAL_TABLET | Freq: Every day | ORAL | Status: DC
  Administered 2019-05-26 – 2019-05-29 (×4): 2.5 mg via ORAL
  Filled 2019-05-25 (×4): qty 1

## 2019-05-25 MED ORDER — DOFETILIDE 250 MCG PO CAPS
250.0000 ug | ORAL_CAPSULE | Freq: Two times a day (BID) | ORAL | Status: DC
  Administered 2019-05-26 – 2019-05-27 (×4): 250 ug via ORAL
  Filled 2019-05-25 (×7): qty 1

## 2019-05-25 MED ORDER — METOPROLOL TARTRATE 5 MG/5ML IV SOLN
5.0000 mg | INTRAVENOUS | Status: AC | PRN
  Administered 2019-05-25 (×2): 5 mg via INTRAVENOUS
  Filled 2019-05-25 (×2): qty 5

## 2019-05-25 NOTE — H&P (Addendum)
Cardiology Admission History and Physical:   Patient ID: Molly [name] MRN: [id number]; DOB: 16-Apr-1933   Admission date: 05/25/2019  Primary Care Provider: Martinique, Betty G, MD Primary Cardiologist: Fransico Him, MD  Primary Electrophysiologist:  [name]  Chief Complaint:  Fatigue, dyspnea  Patient Profile:   Molly [name] is a 85 y.o. female with history of mild LV systolic dysfunction, paroxysmal atrial fibrillation, atrial tachycardia, tachy-brady syndrome s/p pacemaker, HTN, hyperlipidemia and chronic combined diastolic and systolic CHF being admitted with c/o fatigue and dyspnea. She has gained 7 lbs over the past few weeks.   History of Present Illness:   Molly [name] is a 85 yo female with history of mild LV systolic dysfunction, paroxysmal atrial fibrillation, tachy-brady syndrome s/p pacemaker, HTN, hyperlipidemia and chronic combined diastolic and systolic CHF being admitted with c/o fatigue and dyspnea. She has noticed her heart racing at home. She is followed in our EP clinic by Dr. [name]. Her general cardiology issues are followed by Dr. Radford Pax. She tells me that she has been feeling poorly for months. She saw Dr. [name] 05/09/19 and her PPM was working well. Notes indicate she feels poorly when in atrial fib/atrial tach. Her initial EKG in the ED showed atrial fib with RVR. Tele now with a regular rhythm that appears paced. She has had a 7 lb weight gain over the past few weeks. Some LE edema. No chest pain. BNP 543.   Heart Pathway Score:     Past Medical History:  Diagnosis Date  . Aortic insufficiency    mild by echo 2017  . Cancer ([place])    Skin cancer- basal.  1 mylenoma  . Complication of anesthesia   . DCM (dilated cardiomyopathy) ([place])    EF 40-45% by echo 2017 - nuclear stress test with no ischemia  . GERD (gastroesophageal reflux disease)   . H/O benign essential tremor    on amiodarone resolved off amio  . H/O hyperkalemia    Resolved off ACE inhibitors   . High cholesterol   . History of blood transfusion   . Hyperparathyroidism ([place])   . Hypertension   . Mitral regurgitation    mild to moderate by echo 2017  . Persistent atrial fibrillation   . PONV (postoperative nausea and vomiting)   . PVC's (premature ventricular contractions)   . Renal insufficiency    MILD  . Shortness of breath    with exertion  . Tachycardia-bradycardia syndrome [facility])    s/p PPM 02/2013    Past Surgical History:  Procedure Laterality Date  . AMPUTATION Right 11/27/2014   Procedure: RIGHT 2ND AND 3RD TOE AMPUTATION;  Surgeon: Wylene Simmer, MD;  Location: [place] [facility];  Service: Orthopedics;  Laterality: Right;  . BUNIONECTOMY     Right  . CARDIAC CATHETERIZATION  09/25/2000   EF of 50% -- with normal left ventricular size and function  . CARDIOVERSION N/A 12/28/2017   Procedure: CARDIOVERSION;  Surgeon: Dorothy Spark, MD;  [facility] END[facility]COPY;  Service: Cardiovascular;  Laterality: N/A;  . CARDIOVERSION N/A 02/25/2018   Procedure: CARDIOVERSION;  Surgeon: Skeet Latch, MD;  Location: [place];  Service: Cardiovascular;  Laterality: N/A;  . CESAREAN SECTION    . CESAREAN SECTION    . EYE SURGERY Bilateral    Cataract  . FOOT SURGERY    . INSERT / REPLACE / REMOVE PACEMAKER  02/2013  . PACEMAKER INSERTION  02/2013  . PERMANENT PACEMAKER INSERTION N/A 03/02/2013   Procedure: PERMANENT PACEMAKER INSERTION;  Surgeon: Evans Lance, MD;  Location: [facility] CATH LAB;  Service: Cardiovascular;  Laterality: N/A;  . SHOULDER ARTHROSCOPY W/ ROTATOR CUFF REPAIR    . SHOULDER SURGERY Right    roto cuff  . TOE AMPUTATION Left    2nd and 3rd, bunion under toes.  . TONSILLECTOMY    . VARICOSE VEIN SURGERY       Medications Prior to Admission: Prior to Admission medications   Medication Sig Start Date End Date Taking? Authorizing Provider  acetaminophen (TYLENOL) 500 MG tablet Take 1,000 mg by mouth every 6 (six) hours as needed. For pain    [provider]  carvedilol (COREG) 3.125 MG tablet Take 1/2 tablet twice a day 05/01/19   Sherran Needs, NP  dofetilide (TIKOSYN) 250 MCG capsule TAKE ONE CAPSULE (250MG  TOTAL) BY MOUTH TWO TIMES DAILY 07/12/18   Allred, [name], MD  dorzolamide (TRUSOPT) 2 % ophthalmic solution Place 1 drop into the right eye 2 (two) times daily. 06/22/12   [provider]  doxepin (SINEQUAN) 10 MG/ML solution Take 0.3-0.6 mLs (3-6 mg total) by mouth at bedtime. 04/24/19   Martinique, Betty G, MD  famotidine (PEPCID) 20 MG tablet TAKE 1 TABLET (20 MG TOTAL) BY MOUTH 2 (TWO) TIMES DAILY FOR 30 DAYS. 05/05/19 06/04/19  Martinique, Betty G, MD  LORazepam (ATIVAN) 0.5 MG tablet Take 1 tablet (0.5 mg total) by mouth at bedtime. 04/24/19   Martinique, Betty G, MD  Magnesium Oxide 500 MG TABS Take 0.5 tablets (250 mg total) by mouth daily. 02/27/19   Sherran Needs, NP  Multiple Vitamin (MULTIVITAMIN WITH MINERALS) TABS Take 1 tablet by mouth daily.    [provider]  predniSONE (DELTASONE) 10 MG tablet Take 10 mg by mouth daily. Takes 1/2 pill 04/11/19   [provider]  Triamcinolone Acetonide (TRIAMCINOLONE 0.1 % CREAM : EUCERIN) CREA Apply 1 application topically 2 (two) times daily as needed for rash or itching. 11/30/18   Martinique, Betty G, MD  warfarin (COUMADIN) 5 MG tablet TAKE 1/2 TO 1 TABLET DAILY AS DIRECTED BY ANTICOAGULATION CLINIC 01/09/19   Sueanne Margarita, MD     Allergies:    Allergies  Allergen Reactions  . Codeine Nausea Only  . Tramadol Nausea Only    Social History:   Social History   Socioeconomic History  . Marital status: Married    Spouse name: Not on file  . Number of children: 3  . Years of education: Not on file  . Highest education level: Not on file  Occupational History  . Not on file  Social Needs  . Financial resource strain: Not on file  . Food insecurity    Worry: Not on file    Inability: Not on file  . Transportation needs    Medical: Not on file    Non-medical: Not on  file  Tobacco Use  . Smoking status: Never Smoker  . Smokeless tobacco: Never Used  Substance and Sexual Activity  . Alcohol use: No  . Drug use: No  . Sexual activity: Not Currently  Lifestyle  . Physical activity    Days per week: Not on file    Minutes per session: Not on file  . Stress: Not on file  Relationships  . Social Herbalist on phone: Not on file    Gets together: Not on file    Attends religious service: Not on file    Active member of club or organization:  Not on file    Attends meetings of clubs or organizations: Not on file    Relationship status: Not on file  . Intimate partner violence    Fear of current or ex partner: Not on file    Emotionally abused: Not on file    Physically abused: Not on file    Forced sexual activity: Not on file  Other Topics Concern  . Not on file  Social History Narrative  . Not on file    Family History:   The patient's family history includes Hypertension in her father and mother.    ROS:  Please see the history of present illness.  All other ROS reviewed and negative.     Physical Exam/Data:   Vitals:   05/25/19 1334 05/25/19 1603 05/25/19 1758 05/25/19 1815  BP:  (!) 146/87 (!) 140/104 (!) 140/114  Pulse:  79 (!) 128   Resp:  16 20 (!) 24  Temp:  98.1 F (36.7 C)    TempSrc:  Oral    SpO2:  95% 99%   Weight: 88 kg     Height: 5\' 6"  (1.676 m)      No intake or output data in the 24 hours ending 05/25/19 1831 Last 3 Weights 05/25/2019 05/09/2019 04/24/2019  Weight (lbs) 194 lb 192 lb 187 lb 8 oz  Weight (kg) 87.998 kg 87.091 kg 85.049 kg     Body mass index is 31.31 kg/m.  General:  Well nourished, well developed, in no acute distress HEENT: normal Lymph: no adenopathy Neck: + JVD Endocrine:  No thryomegaly Vascular: No carotid bruits; FA pulses 2+ bilaterally without bruits  Cardiac:  normal S1, S2; RRR; systolic murmur Lungs:  clear to auscultation bilaterally, no wheezing, rhonchi or rales   Abd: soft, nontender, no hepatomegaly  Ext: Trace to 1+ bilateral LE edema Musculoskeletal:  No deformities, BUE and BLE strength normal and equal Skin: warm and dry  Neuro:  CNs 2-12 intact, no focal abnormalities noted Psych:  Normal affect    EKG:  The ECG that was done was personally reviewed and demonstrates atrial fib with RVR  Relevant CV Studies:   Laboratory Data:  High Sensitivity Troponin:  No results for input(s): TROPONINIHS in the last 720 hours.    Cardiac EnzymesNo results for input(s): TROPONINI in the last 168 hours. No results for input(s): TROPIPOC in the last 168 hours.  Chemistry Recent Labs  Lab 05/25/19 1337  NA 138  K 4.4  CL 109  CO2 21*  GLUCOSE 122*  BUN 16  CREATININE 1.29*  CALCIUM 10.3  GFRNONAA 38*  GFRAA 44*  ANIONGAP 8    No results for input(s): PROT, ALBUMIN, AST, ALT, ALKPHOS, BILITOT in the last 168 hours. Hematology Recent Labs  Lab 05/25/19 1337  WBC 8.3  RBC 3.18*  HGB 9.7*  HCT 31.5*  MCV 99.1  MCH 30.5  MCHC 30.8  RDW 15.1  PLT 243   BNP Recent Labs  Lab 05/25/19 1337  BNP 543.7*    DDimer No results for input(s): DDIMER in the last 168 hours.   Radiology/Studies:  Dg Chest 2 View  Result Date: 05/25/2019 CLINICAL DATA:  Cough and short of breath EXAM: CHEST - 2 VIEW COMPARISON:  02/22/2017 FINDINGS: Cardiac enlargement without heart failure. Dual lead pacemaker unchanged. Decreased lung volume with mild bibasilar atelectasis. Small pleural effusions. Lower thoracic compression fractures appear chronic. IMPRESSION: Hypoventilation with bibasilar atelectasis. Negative for heart failure. Electronically Signed   By: Juanda Crumble  Carlis Abbott M.D.   On: 05/25/2019 14:48    Assessment and Plan:   1. Acute on chronic systolic CHF: She is volume overloaded. BNP is elevated. Will start IV Lasix tonight  2. Sinus node dysfunction: PPM in place  3. NICM: Will arrange echo tomorrow  4. Atrial fib with RVR: She appears to be  in sinus now. Continue coumadin, dofetilide and Coreg.   5. Mitral valve regurgitation: mild to moderate by echo in February 2019. Repeat echo tomorrow.  6. Aortic valve insufficiency: Mild by echo in February 2019. Repeat echo tomorrow.   Severity of Illness: The appropriate patient status for this patient is INPATIENT. Inpatient status is judged to be reasonable and necessary in order to provide the required intensity of service to ensure the patient's safety. The patient's presenting symptoms, physical exam findings, and initial radiographic and laboratory data in the context of their chronic comorbidities is felt to place them at high risk for further clinical deterioration. Furthermore, it is not anticipated that the patient will be medically stable for discharge from the hospital within 2 midnights of admission. The following factors support the patient status of inpatient.   " The patient's presenting symptoms include fatigue, dyspnea " The worrisome physical exam findings include volume overload " The initial radiographic and laboratory data are worrisome because of EKG with atrial fib with RVR " The chronic co-morbidities include heart block s/p PPM, HTN, HLD, NICM, chronic CHF   * I certify that at the point of admission it is my clinical judgment that the patient will require inpatient hospital care spanning beyond 2 midnights from the point of admission due to high intensity of service, high risk for further deterioration and high frequency of surveillance required.*  For questions or updates, please contact [place] Please consult [email or web address] for contact info under     Signed, Lauree Chandler, MD  05/25/2019 6:31 PM

## 2019-05-25 NOTE — Progress Notes (Signed)
Attempted to call pt x 2 on home phone.  Would not ring, only rapid beeping.  Called spouse's cell phone and he took down our number and said he would have the pt call us back.

## 2019-05-25 NOTE — ED Notes (Signed)
ED TO INPATIENT HANDOFF REPORT  ED Nurse Name and Phone #:  Patty [phone]  S Name/Age/Gender Molly [name] 85 y.o. female Room/Bed: 022C/022C  Code Status   Code Status: Prior  Home/SNF/Other Home Patient oriented to: self, place, time and situation Is this baseline? Yes   Triage Complete: Triage complete  Chief Complaint fluid on lungs/CHF  Triage Note Patient states she has had dry cough for about a month. Patient states she has been feeling fatigued and feels that she has extra fluid on her. Called her PCP and could not see her in office due to cough.    Allergies Allergies  Allergen Reactions  . Codeine Nausea Only  . Tramadol Nausea Only    Level of Care/Admitting Diagnosis ED Disposition    ED Disposition Condition Comment   Admit  The patient appears reasonably stabilized for admission considering the current resources, flow, and capabilities available in the ED at this time, and I doubt any other [facility] requiring further screening and/or treatment in the ED prior to admission is  present.       B Medical/Surgery History Past Medical History:  Diagnosis Date  . Aortic insufficiency    mild by echo 2017  . Cancer ([place])    Skin cancer- basal.  1 mylenoma  . Complication of anesthesia   . DCM (dilated cardiomyopathy) ([place])    EF 40-45% by echo 2017 - nuclear stress test with no ischemia  . GERD (gastroesophageal reflux disease)   . H/O benign essential tremor    on amiodarone resolved off amio  . H/O hyperkalemia    Resolved off ACE inhibitors  . High cholesterol   . History of blood transfusion   . Hyperparathyroidism ([place])   . Hypertension   . Mitral regurgitation    mild to moderate by echo 2017  . Persistent atrial fibrillation   . PONV (postoperative nausea and vomiting)   . PVC's (premature ventricular contractions)   . Renal insufficiency    MILD  . Shortness of breath    with exertion  . Tachycardia-bradycardia syndrome [facility])    s/p  PPM 02/2013   Past Surgical History:  Procedure Laterality Date  . AMPUTATION Right 11/27/2014   Procedure: RIGHT 2ND AND 3RD TOE AMPUTATION;  Surgeon: Wylene Simmer, MD;  Location: [place];  Service: Orthopedics;  Laterality: Right;  . BUNIONECTOMY     Right  . CARDIAC CATHETERIZATION  09/25/2000   EF of 50% -- with normal left ventricular size and function  . CARDIOVERSION N/A 12/28/2017   Procedure: CARDIOVERSION;  Surgeon: Dorothy Spark, MD;  Location: [facility] ENDOSCOPY;  Service: Cardiovascular;  Laterality: N/A;  . CARDIOVERSION N/A 02/25/2018   Procedure: CARDIOVERSION;  Surgeon: Skeet Latch, MD;  Location: [place];  Service: Cardiovascular;  Laterality: N/A;  . CESAREAN SECTION    . CESAREAN SECTION    . EYE SURGERY Bilateral    Cataract  . FOOT SURGERY    . INSERT / REPLACE / REMOVE PACEMAKER  02/2013  . PACEMAKER INSERTION  02/2013  . PERMANENT PACEMAKER INSERTION N/A 03/02/2013   Procedure: PERMANENT PACEMAKER INSERTION;  Surgeon: Evans Lance, MD;  Location: [facility] CATH LAB;  Service: Cardiovascular;  Laterality: N/A;  . SHOULDER ARTHROSCOPY W/ ROTATOR CUFF REPAIR    . SHOULDER SURGERY Right    roto cuff  . TOE AMPUTATION Left    2nd and 3rd, bunion under toes.  . TONSILLECTOMY    . VARICOSE VEIN SURGERY  A IV Location/Drains/Wounds Patient Lines/Drains/Airways Status   Active Line/Drains/Airways    None          Intake/Output Last 24 hours No intake or output data in the 24 hours ending 05/25/19 1916  Labs/Imaging Results for orders placed or performed during the hospital encounter of 05/25/19 (from the past 48 hour(s))  Basic metabolic panel     Status: Abnormal   Collection Time: 05/25/19  1:37 PM  Result Value Ref Range   Sodium 138 135 - 145 mmol/L   Potassium 4.4 3.5 - 5.1 mmol/L   Chloride 109 98 - 111 mmol/L   CO2 21 (L) 22 - 32 mmol/L   Glucose, Bld 122 (H) 70 - 99 mg/dL   BUN 16 8 - 23 mg/dL   Creatinine, Ser 1.29 (H) 0.44 - 1.00 mg/dL    Calcium 10.3 8.9 - 10.3 mg/dL   GFR calc non Af Amer 38 (L) >60 mL/min   GFR calc Af Amer 44 (L) >60 mL/min   Anion gap 8 5 - 15    Comment: Performed at [facility], [place] [address] [place], [place] [address]  CBC     Status: Abnormal   Collection Time: 05/25/19  1:37 PM  Result Value Ref Range   WBC 8.3 4.0 - 10.5 K/uL   RBC 3.18 (L) 3.87 - 5.11 MIL/uL   Hemoglobin 9.7 (L) 12.0 - 15.0 g/dL   HCT 31.5 (L) 36.0 - 46.0 %   MCV 99.1 80.0 - 100.0 fL   MCH 30.5 26.0 - 34.0 pg   MCHC 30.8 30.0 - 36.0 g/dL   RDW 15.1 11.5 - 15.5 %   Platelets 243 150 - 400 K/uL   nRBC 0.0 0.0 - 0.2 %    Comment: Performed at [facility], [place] [address], [place], [place] [address]  Brain natriuretic peptide     Status: Abnormal   Collection Time: 05/25/19  1:37 PM  Result Value Ref Range   B Natriuretic Peptide 543.7 (H) 0.0 - 100.0 pg/mL    Comment: Performed at [facility] [address], [place], [place] [address]  Protime-INR     Status: Abnormal   Collection Time: 05/25/19  6:07 PM  Result Value Ref Range   Prothrombin Time 29.7 (H) 11.4 - 15.2 seconds   INR 2.9 (H) 0.8 - 1.2    Comment: (NOTE) INR goal varies based on device and disease states. Performed at [facility], [place] [address] [place], [place] [address]   APTT     Status: Abnormal   Collection Time: 05/25/19  6:07 PM  Result Value Ref Range   aPTT 37 (H) 24 - 36 seconds    Comment:        IF BASELINE aPTT IS ELEVATED, SUGGEST PATIENT RISK ASSESSMENT BE USED TO DETERMINE APPROPRIATE ANTICOAGULANT THERAPY. Performed at [facility], [place] [address] [place], [place] [address]    Dg Chest 2 View  Result Date: 05/25/2019 CLINICAL DATA:  Cough and short of breath EXAM: CHEST - 2 VIEW COMPARISON:  02/22/2017 FINDINGS: Cardiac enlargement without heart failure. Dual lead pacemaker unchanged. Decreased lung volume with mild bibasilar atelectasis. Small pleural effusions. Lower thoracic compression  fractures appear chronic. IMPRESSION: Hypoventilation with bibasilar atelectasis. Negative for heart failure. Electronically Signed   By: Franchot Gallo M.D.   On: 05/25/2019 14:48    Pending Labs [employer] (From admission, onward)    Start  Ordered   05/25/19 1811  SARS Coronavirus 2 (CEPHEID - Performed in [place] hospital lab), Hosp Order  (Asymptomatic Patients Labs)  Once,   STAT    Question:  Rule Out  Answer:  Yes   05/25/19 1811          Vitals/Pain Today's Vitals   05/25/19 1603 05/25/19 1758 05/25/19 1815 05/25/19 1845  BP: (!) 146/87 (!) 140/104 (!) 140/114 129/82  Pulse: 79 (!) 128  78  Resp: 16 20 (!) 24 17  Temp: 98.1 F (36.7 C)     TempSrc: Oral     SpO2: 95% 99%  96%  Weight:      Height:      PainSc:        Isolation Precautions No active isolations  Medications Medications  metoprolol tartrate (LOPRESSOR) injection 5 mg (5 mg Intravenous Given 05/25/19 1822)    Mobility walks     Focused Assessments    R Recommendations: See Admitting Provider Note  Report given to:   Additional Notes:

## 2019-05-25 NOTE — ED Provider Notes (Signed)
[facility] EMERGENCY DEPARTMENT Provider Note   CSN: [id number] Arrival date & time: 05/25/19  1321    History   Chief Complaint Chief Complaint  Patient presents with  . Cough    HPI Molly [name] is a 85 y.o. female.     HPI 85 year old with history of aortic insufficiency, cardiomyopathy with a EF of 40%, mitral valve regurgitation comes in a chief complaint of worsening cough, shortness of breath.   Patient reports that her symptoms have progressed over the past several days.  She is having more fatigue and feels like there is extra fluid building up in her lungs.  She has been taking her medications as prescribed.  She last saw cardiology few weeks back, at which time everything was fine on her evaluation.  Her cough is nonproductive and fairly constant, it is worse at night and when she is laying flat.  Her shortness of breath is also worse when she is laying flat and now she is having shortness of breath with minimal exertion. No associated chest pain.   Past Medical History:  Diagnosis Date  . Aortic insufficiency    mild by echo 2017  . Cancer ([place])    Skin cancer- basal.  1 mylenoma  . Complication of anesthesia   . DCM (dilated cardiomyopathy) ([place])    EF 40-45% by echo 2017 - nuclear stress test with no ischemia  . GERD (gastroesophageal reflux disease)   . H/O benign essential tremor    on amiodarone resolved off amio  . H/O hyperkalemia    Resolved off ACE inhibitors  . High cholesterol   . History of blood transfusion   . Hyperparathyroidism ([place])   . Hypertension   . Mitral regurgitation    mild to moderate by echo 2017  . Persistent atrial fibrillation   . PONV (postoperative nausea and vomiting)   . PVC's (premature ventricular contractions)   . Renal insufficiency    MILD  . Shortness of breath    with exertion  . Tachycardia-bradycardia syndrome [facility])    s/p PPM 02/2013    Patient Active Problem List   Diagnosis  Date Noted  . CKD (chronic kidney disease), stage III ([place]) 04/24/2019  . Back pain, chronic 09/19/2018  . Anxiety disorder, unspecified 09/19/2018  . Visit for monitoring Tikosyn therapy 02/07/2018  . Chronic combined systolic and diastolic heart failure ([place]) 12/28/2017  . Hypotension 05/17/2017  . SOB (shortness of breath)   . DCM (dilated cardiomyopathy) ([place]) 11/10/2016  . Aortic insufficiency   . Mitral regurgitation 11/06/2015  . Encounter for therapeutic drug monitoring 01/29/2015  . Tachycardia-bradycardia syndrome ([place])   . Pacemaker 04/18/2013  . Claw toe, acquired 06/28/2012  . Pain in joint involving ankle and foot 06/28/2012  . PVC (premature ventricular contraction) 06/07/2012  . Fatigue 06/07/2012  . Symptomatic bradycardia 05/23/2012  . Hyperlipidemia 10/29/2009  . PULMONARY FUNCTION TESTS, ABNORMAL 10/29/2009  . Essential hypertension 10/28/2009  . Persistent atrial fibrillation 10/28/2009    Past Surgical History:  Procedure Laterality Date  . AMPUTATION Right 11/27/2014   Procedure: RIGHT 2ND AND 3RD TOE AMPUTATION;  Surgeon: Wylene Simmer, MD;  Location: [facility];  Service: Orthopedics;  Laterality: Right;  . BUNIONECTOMY     Right  . CARDIAC CATHETERIZATION  09/25/2000   EF of 50% -- with normal left ventricular size and function  . CARDIOVERSION N/A 12/28/2017   Procedure: CARDIOVERSION;  Surgeon: Dorothy Spark, MD;  Location: [facility];  Service:  Cardiovascular;  Laterality: N/A;  . CARDIOVERSION N/A 02/25/2018   Procedure: CARDIOVERSION;  Surgeon: Skeet Latch, MD;  Location: [facility];  Service: Cardiovascular;  Laterality: N/A;  . CESAREAN SECTION    . CESAREAN SECTION    . EYE SURGERY Bilateral    Cataract  . FOOT SURGERY    . INSERT / REPLACE / REMOVE PACEMAKER  02/2013  . PACEMAKER INSERTION  02/2013  . PERMANENT PACEMAKER INSERTION N/A 03/02/2013   Procedure: PERMANENT PACEMAKER INSERTION;  Surgeon: Evans Lance, MD;  Location: [facility] CATH  LAB;  Service: Cardiovascular;  Laterality: N/A;  . SHOULDER ARTHROSCOPY W/ ROTATOR CUFF REPAIR    . SHOULDER SURGERY Right    roto cuff  . TOE AMPUTATION Left    2nd and 3rd, bunion under toes.  . TONSILLECTOMY    . VARICOSE VEIN SURGERY       OB History   No obstetric history on file.      Home Medications    Prior to Admission medications   Medication Sig Start Date End Date Taking? Authorizing Provider  acetaminophen (TYLENOL) 500 MG tablet Take 1,000 mg by mouth every 6 (six) hours as needed. For pain    [provider]  carvedilol (COREG) 3.125 MG tablet Take 1/2 tablet twice a day 05/01/19   Sherran Needs, NP  dofetilide (TIKOSYN) 250 MCG capsule TAKE ONE CAPSULE (250MG  TOTAL) BY MOUTH TWO TIMES DAILY 07/12/18   Allred, [name], MD  dorzolamide (TRUSOPT) 2 % ophthalmic solution Place 1 drop into the right eye 2 (two) times daily. 06/22/12   [provider]  doxepin (SINEQUAN) 10 MG/ML solution Take 0.3-0.6 mLs (3-6 mg total) by mouth at bedtime. 04/24/19   [place]ique, Betty G, MD  famotidine (PEPCID) 20 MG tablet TAKE 1 TABLET (20 MG TOTAL) BY MOUTH 2 (TWO) TIMES DAILY FOR 30 DAYS. 05/05/19 06/04/19  [place], Betty G, MD  LORazepam (ATIVAN) 0.5 MG tablet Take 1 tablet (0.5 mg total) by mouth at bedtime. 04/24/19   [place], Betty G, MD  Magnesium Oxide 500 MG TABS Take 0.5 tablets (250 mg total) by mouth daily. 02/27/19   Sherran Needs, NP  Multiple Vitamin (MULTIVITAMIN WITH MINERALS) TABS Take 1 tablet by mouth daily.    [provider]  predniSONE (DELTASONE) 10 MG tablet Take 10 mg by mouth daily. Takes 1/2 pill 04/11/19   [provider]  Triamcinolone Acetonide (TRIAMCINOLONE 0.1 % CREAM : EUCERIN) CREA Apply 1 application topically 2 (two) times daily as needed for rash or itching. 11/30/18   [place]ique, Betty G, MD  warfarin (COUMADIN) 5 MG tablet TAKE 1/2 TO 1 TABLET DAILY AS DIRECTED BY ANTICOAGULATION CLINIC 01/09/19   Sueanne Margarita, MD    Family  History Family History  Problem Relation Age of Onset  . Hypertension Mother   . Hypertension Father     Social History Social History   Tobacco Use  . Smoking status: Never Smoker  . Smokeless tobacco: Never Used  Substance Use Topics  . Alcohol use: No  . Drug use: No     Allergies   Codeine and Tramadol   Review of Systems Review of Systems  Constitutional: Positive for activity change and fatigue.  Respiratory: Positive for cough and shortness of breath.   Cardiovascular: Negative for chest pain.  Hematological: Bruises/bleeds easily.  All other systems reviewed and are negative.    Physical Exam Updated Vital Signs BP 129/82   Pulse 82   Temp 98.1  F (36.7 C) (Oral)   Resp (!) 24   Ht 5\' 6"  (1.676 m)   Wt 88 kg   SpO2 98%   BMI 31.31 kg/m   Physical Exam Vitals signs and nursing note reviewed.  Constitutional:      Appearance: She is well-developed.  HENT:     Head: Normocephalic and atraumatic.  Eyes:     Pupils: Pupils are equal, round, and reactive to light.  Neck:     Musculoskeletal: Neck supple.  Cardiovascular:     Rate and Rhythm: Tachycardia present. Rhythm irregular.     Heart sounds: Murmur present.     Comments: Positive JVD Pulmonary:     Effort: Pulmonary effort is normal. No respiratory distress.  Abdominal:     General: There is no distension.     Palpations: Abdomen is soft.     Tenderness: There is no abdominal tenderness.  Musculoskeletal:     Right lower leg: No edema.     Left lower leg: No edema.  Skin:    General: Skin is warm and dry.  Neurological:     Mental Status: She is alert and oriented to person, place, and time.      ED Treatments / Results  Labs (all labs ordered are listed, but only abnormal results are displayed) Labs Reviewed  BASIC METABOLIC PANEL - Abnormal; Notable for the following components:      Result Value   CO2 21 (*)    Glucose, Bld 122 (*)    Creatinine, Ser 1.29 (*)    GFR calc  non Af Amer 38 (*)    GFR calc Af Amer 44 (*)    All other components within normal limits  CBC - Abnormal; Notable for the following components:   RBC 3.18 (*)    Hemoglobin 9.7 (*)    HCT 31.5 (*)    All other components within normal limits  BRAIN NATRIURETIC PEPTIDE - Abnormal; Notable for the following components:   B Natriuretic Peptide 543.7 (*)    All other components within normal limits  PROTIME-INR - Abnormal; Notable for the following components:   Prothrombin Time 29.7 (*)    INR 2.9 (*)    All other components within normal limits  APTT - Abnormal; Notable for the following components:   aPTT 37 (*)    All other components within normal limits  SARS CORONAVIRUS 2 (HOSPITAL ORDER, [facility])    EKG EKG Interpretation  Date/Time:  Thursday May 25 2019 16:18:49 EDT Ventricular Rate:  121 PR Interval:    QRS Duration: 132 QT Interval:  382 QTC Calculation: 542 R Axis:   -46 Text Interpretation:  Atrial fibrillation with rapid ventricular response with premature ventricular or aberrantly conducted complexes Left axis deviation Non-specific intra-ventricular conduction block Cannot rule out Anterior infarct , age undetermined Abnormal ECG afib with rvr Nonspecific ST and T wave abnormality Confirmed by Varney Biles [phone]) on 05/25/2019 7:22:03 PM   Radiology Dg Chest 2 View  Result Date: 05/25/2019 CLINICAL DATA:  Cough and short of breath EXAM: CHEST - 2 VIEW COMPARISON:  02/22/2017 FINDINGS: Cardiac enlargement without heart failure. Dual lead pacemaker unchanged. Decreased lung volume with mild bibasilar atelectasis. Small pleural effusions. Lower thoracic compression fractures appear chronic. IMPRESSION: Hypoventilation with bibasilar atelectasis. Negative for heart failure. Electronically Signed   By: Franchot Gallo M.D.   On: 05/25/2019 14:48    Procedures .Critical Care Performed by: Varney Biles, MD Authorized by:  Varney Biles, MD   Critical care provider statement:    Critical care time (minutes):  45   Critical care was necessary to treat or prevent imminent or life-threatening deterioration of the following conditions:  Cardiac failure   Critical care was time spent personally by me on the following activities:  Discussions with consultants, evaluation of patient's response to treatment, examination of patient, ordering and performing treatments and interventions, ordering and review of laboratory studies, ordering and review of radiographic studies, pulse oximetry, re-evaluation of patient's condition, obtaining history from patient or surrogate and review of old charts   (including critical care time)  Medications Ordered in ED Medications  metoprolol tartrate (LOPRESSOR) injection 5 mg (5 mg Intravenous Given 05/25/19 1822)     Initial Impression / Assessment and Plan / ED Course  I have reviewed the triage vital signs and the nursing notes.  Pertinent labs & imaging results that were available during my care of the patient were reviewed by me and considered in my medical decision making (see chart for details).       This patients CHA2DS2-VASc Score and unadjusted Ischemic Stroke Rate (% per year) is equal to 9.7 % stroke rate/year from a score of 6  Above score calculated as 1 point each if present [CHF, HTN, DM, Vascular=MI/PAD/Aortic Plaque, Age if 65-74, or Female] Above score calculated as 2 points each if present [Age > 75, or Stroke/TIA/TE]  85 year old comes in a chief complaint of cough, weakness, shortness of breath with exertion. She has been feeling unwell for the last several days.  Her symptoms have progressed.  She is noted to be in A. fib with RVR.  She is taking her medications as prescribed.  My suspicion is that her symptoms are because of decompensation of her A. Fib. It appears that she has been maxed out on medical management for A. fib based on the cardiovascular notes.   At this time she is stable.  Given her advanced CHF and valvular disorder, we will not cardiovert.  We will give her IV metoprolol 5 mg, cardiology has been consulted.  Final Clinical Impressions(s) / ED Diagnoses   Final diagnoses:  Atrial fibrillation with RVR [facility])    ED Discharge Orders    None       Varney Biles, MD 05/25/19 [phone]

## 2019-05-25 NOTE — Telephone Encounter (Signed)
Molly Margarita, MD at 05/24/2019 3:30 PM  Status: Signed    Please call in am 7/30 and if still feeling bad then needs to go to ER for evaluation.  Needs COVID test

## 2019-05-25 NOTE — Telephone Encounter (Signed)
Patient called returning a call from our office this morning. Her husband picked up the phone , and the office asked the patient to call us back.  Please use the cell phone number, as the home phone is currently out of service

## 2019-05-25 NOTE — Telephone Encounter (Signed)
Called the pt and she states she still feels horrible and didn't rest well at all last night.  Pt states she still has ongoing coughing which worsens at night, no energy, sob, doe, and swelling in her lower extremities.  Pt states she is on no diuresis anymore, for afib clinic took this away, due to what she said "because they told me spironolactone caused my HR to drop." Pt agreed to go to the ER at [facility], as recommended by Dr. Radford Pax, for further evaluation of her symptoms. Pt states that she cannot get there until around lunch time, for she is going to wait for her husband to get back home. Pt does not want to call EMS, for she states she is stable and can wait for her Husband to take her.  Advised the pt to go as soon as he arrives.  Informed her that I will make Dr. Radford Pax aware that she will be going to the ER at [facility] today.  Pt verbalized understanding and agrees with this plan.

## 2019-05-25 NOTE — ED Triage Notes (Signed)
Patient states she has had dry cough for about a month. Patient states she has been feeling fatigued and feels that she has extra fluid on her. Called her PCP and could not see her in office due to cough.

## 2019-05-25 NOTE — Telephone Encounter (Signed)
-----   Message from Sueanne Margarita, MD sent at 05/24/2019  7:33 PM EDT -----   ----- Message ----- From: Burnis Medin, MD Sent: 05/24/2019   5:00 PM EDT To: Sueanne Margarita, MD  FYI  not sure if having  resp infection vs   hf sx

## 2019-05-26 ENCOUNTER — Inpatient Hospital Stay (HOSPITAL_COMMUNITY): Payer: Medicare Other

## 2019-05-26 DIAGNOSIS — I340 Nonrheumatic mitral (valve) insufficiency: Secondary | ICD-10-CM

## 2019-05-26 DIAGNOSIS — I480 Paroxysmal atrial fibrillation: Secondary | ICD-10-CM

## 2019-05-26 DIAGNOSIS — I471 Supraventricular tachycardia: Secondary | ICD-10-CM

## 2019-05-26 DIAGNOSIS — I361 Nonrheumatic tricuspid (valve) insufficiency: Secondary | ICD-10-CM

## 2019-05-26 DIAGNOSIS — I351 Nonrheumatic aortic (valve) insufficiency: Secondary | ICD-10-CM

## 2019-05-26 LAB — BASIC METABOLIC PANEL
Anion gap: 9 (ref 5–15)
BUN: 17 mg/dL (ref 8–23)
CO2: 22 mmol/L (ref 22–32)
Calcium: 10.1 mg/dL (ref 8.9–10.3)
Chloride: 109 mmol/L (ref 98–111)
Creatinine, Ser: 1.28 mg/dL — ABNORMAL HIGH (ref 0.44–1.00)
GFR calc Af Amer: 44 mL/min — ABNORMAL LOW (ref >60)
GFR calc non Af Amer: 38 mL/min — ABNORMAL LOW (ref >60)
Glucose, Bld: 100 mg/dL — ABNORMAL HIGH (ref 70–99)
Potassium: 3.9 mmol/L (ref 3.5–5.1)
Sodium: 140 mmol/L (ref 135–145)

## 2019-05-26 LAB — ECHOCARDIOGRAM COMPLETE
Height: 65 in
Weight: 2970.04 oz

## 2019-05-26 LAB — PROTIME-INR
INR: 2.7 — ABNORMAL HIGH (ref 0.8–1.2)
Prothrombin Time: 28.5 seconds — ABNORMAL HIGH (ref 11.4–15.2)

## 2019-05-26 MED ORDER — METOPROLOL TARTRATE 25 MG PO TABS
25.0000 mg | ORAL_TABLET | ORAL | Status: AC
  Administered 2019-05-26: 25 mg via ORAL
  Filled 2019-05-26: qty 1

## 2019-05-26 MED ORDER — POTASSIUM CHLORIDE CRYS ER 20 MEQ PO TBCR
20.0000 meq | EXTENDED_RELEASE_TABLET | Freq: Once | ORAL | Status: AC
  Administered 2019-05-26: 20 meq via ORAL
  Filled 2019-05-26: qty 1

## 2019-05-26 MED ORDER — METOPROLOL TARTRATE 5 MG/5ML IV SOLN
5.0000 mg | INTRAVENOUS | Status: AC | PRN
  Administered 2019-05-26 – 2019-05-27 (×2): 5 mg via INTRAVENOUS
  Filled 2019-05-26 (×2): qty 5

## 2019-05-26 NOTE — Consult Note (Signed)
Cardiology Consultation:   Patient ID: Molly [name] MRN: [id number]; DOB: [birth year]  Admit date: 05/25/2019 Date of Consult: 05/26/2019  Primary Care Provider: Martinique, Betty G, MD Primary Cardiologist: Fransico Him, MD  Primary Electrophysiologist:  Dr. [name]   Patient Profile:   MARCEDES [name] is a 85 y.o. female with a hx of DCM, chronic CHF (systolic), HTN, tachy-brady, sinus node dysfunction with PPM, paroxysmal Afib, ATach, a remote h/p frequent PVCs as well, who is being seen today for the evaluation of recurrent symptomatic AFib at the request of Dr. Julianne Handler.   AFib Hx AAD:  Failed amiodarone stopped 2/2 tremor that resolved off, older note report developed long toxicity Failed sotalol it seems 2/2 fatigue/malaise (this is less clear) Currently on Tikosyn (256mcg)  Device information: BSci dual chamber PPM   History of Present Illness:   [name] last saw Dr. [name] 05/09/2019, at that visit mentioned not much AFib, though bothered by the ATach and offered an ablation though the patient wanted to hold off.  She was admitted to [facility] yesterday with c/o CP, fatigue, SOB, noted to be in fast AFib with fluid OL.  Started on IV lasix, planned for updated echo, and at the time of admission had returned back to SR. She has been in/out of AF and EP  Is asked to weigh in.  LABS K+ 4.4 >> 3.9 BUN/Creat 17/1.28 BNP 543 WBC 8.3 H/H 9.7/31.5 Plts 243  INR 2.9, 2.7      Heart Pathway Score:     Past Medical History:  Diagnosis Date  . Aortic insufficiency    mild by echo 2017  . Cancer ([place])    Skin cancer- basal.  1 mylenoma  . Complication of anesthesia   . DCM (dilated cardiomyopathy) ([place])    EF 40-45% by echo 2017 - nuclear stress test with no ischemia  . GERD (gastroesophageal reflux disease)   . H/O benign essential tremor    on amiodarone resolved off amio  . H/O hyperkalemia    Resolved off ACE inhibitors  . High cholesterol   . History of  blood transfusion   . Hyperparathyroidism ([place])   . Hypertension   . Mitral regurgitation    mild to moderate by echo 2017  . Persistent atrial fibrillation   . PONV (postoperative nausea and vomiting)   . PVC's (premature ventricular contractions)   . Renal insufficiency    MILD  . Shortness of breath    with exertion  . Tachycardia-bradycardia syndrome [facility])    s/p PPM 02/2013    Past Surgical History:  Procedure Laterality Date  . AMPUTATION Right 11/27/2014   Procedure: RIGHT 2ND AND 3RD TOE AMPUTATION;  Surgeon: Wylene Simmer, MD;  Location: [place];  Service: Orthopedics;  Laterality: Right;  . BUNIONECTOMY     Right  . CARDIAC CATHETERIZATION  09/25/2000   EF of 50% -- with normal left ventricular size and function  . CARDIOVERSION N/A 12/28/2017   Procedure: CARDIOVERSION;  Surgeon: Dorothy Spark, MD;  Location: [facility] ENDOSCOPY;  Service: Cardiovascular;  Laterality: N/A;  . CARDIOVERSION N/A 02/25/2018   Procedure: CARDIOVERSION;  Surgeon: Skeet Latch, MD;  Location: [place];  Service: Cardiovascular;  Laterality: N/A;  . CESAREAN SECTION    . CESAREAN SECTION    . EYE SURGERY Bilateral    Cataract  . FOOT SURGERY    . INSERT / REPLACE / REMOVE PACEMAKER  02/2013  . PACEMAKER INSERTION  02/2013  . PERMANENT PACEMAKER INSERTION  N/A 03/02/2013   Procedure: PERMANENT PACEMAKER INSERTION;  Surgeon: Evans Lance, MD;  Location: [facility] CATH LAB;  Service: Cardiovascular;  Laterality: N/A;  . SHOULDER ARTHROSCOPY W/ ROTATOR CUFF REPAIR    . SHOULDER SURGERY Right    roto cuff  . TOE AMPUTATION Left    2nd and 3rd, bunion under toes.  . TONSILLECTOMY    . VARICOSE VEIN SURGERY       Home Medications:  Prior to Admission medications   Medication Sig Start Date End Date Taking? Authorizing Provider  acetaminophen (TYLENOL) 500 MG tablet Take 1,000 mg by mouth 2 (two) times daily as needed for mild pain. For pain    Yes [provider]  carvedilol (COREG) 3.125 MG  tablet Take 1/2 tablet twice a day Patient taking differently: Take 3.125 mg by mouth 2 (two) times a day.  05/01/19  Yes Sherran Needs, NP  dofetilide (TIKOSYN) 250 MCG capsule TAKE ONE CAPSULE (250MG  TOTAL) BY MOUTH TWO TIMES DAILY Patient taking differently: Take 250 mcg by mouth 2 (two) times daily.  07/12/18  Yes Allred, [name], MD  dorzolamide (TRUSOPT) 2 % ophthalmic solution Place 1 drop into the right eye 2 (two) times daily. 06/22/12  Yes [provider]  LORazepam (ATIVAN) 0.5 MG tablet Take 1 tablet (0.5 mg total) by mouth at bedtime. 04/24/19  Yes Martinique, Betty G, MD  Magnesium Oxide 500 MG TABS Take 0.5 tablets (250 mg total) by mouth daily. 02/27/19  Yes Sherran Needs, NP  Multiple Vitamin (MULTIVITAMIN WITH MINERALS) TABS Take 1 tablet by mouth daily.   Yes [provider]  omeprazole (PRILOSEC) 20 MG capsule Take 20 mg by mouth daily.   Yes [provider]  warfarin (COUMADIN) 5 MG tablet TAKE 1/2 TO 1 TABLET DAILY AS DIRECTED BY ANTICOAGULATION CLINIC Patient taking differently: Take 2.5-5 mg by mouth See admin instructions. Take 2.5mg  daily everyday except on mondays take 5mg  daily 01/09/19  Yes Turner, Traci R, MD  famotidine (PEPCID) 20 MG tablet TAKE 1 TABLET (20 MG TOTAL) BY MOUTH 2 (TWO) TIMES DAILY FOR 30 DAYS. Patient not taking: Reported on 05/25/2019 05/05/19 06/04/19  Martinique, Betty G, MD    Inpatient Medications: Scheduled Meds: . carvedilol  1.5625 mg Oral BID WC  . dofetilide  250 mcg Oral Q12H  . dorzolamide  1 drop Right Eye BID  . famotidine  20 mg Oral BID  . furosemide  40 mg Intravenous BID  . LORazepam  0.5 mg Oral QHS  . magnesium oxide  200 mg Oral Daily  . sodium chloride flush  3 mL Intravenous Q12H  . warfarin  2.5 mg Oral q1800  . Warfarin - Physician Dosing Inpatient   Does not apply q1800   Continuous Infusions: . sodium chloride     PRN Meds: sodium chloride, acetaminophen, ondansetron (ZOFRAN) IV, sodium chloride  flush  Allergies:    Allergies  Allergen Reactions  . Codeine Nausea Only  . Tramadol Nausea Only    Social History:   Social History   Socioeconomic History  . Marital status: Married    Spouse name: Not on file  . Number of children: 3  . Years of education: Not on file  . Highest education level: Not on file  Occupational History  . Not on file  Social Needs  . Financial resource strain: Not on file  . Food insecurity    Worry: Not on file    Inability: Not on file  .  Transportation needs    Medical: Not on file    Non-medical: Not on file  Tobacco Use  . Smoking status: Never Smoker  . Smokeless tobacco: Never Used  Substance and Sexual Activity  . Alcohol use: No  . Drug use: No  . Sexual activity: Not Currently  Lifestyle  . Physical activity    Days per week: Not on file    Minutes per session: Not on file  . Stress: Not on file  Relationships  . Social Herbalist on phone: Not on file    Gets together: Not on file    Attends religious service: Not on file    Active member of club or organization: Not on file    Attends meetings of clubs or organizations: Not on file    Relationship status: Not on file  . Intimate partner violence    Fear of current or ex partner: Not on file    Emotionally abused: Not on file    Physically abused: Not on file    Forced sexual activity: Not on file  Other Topics Concern  . Not on file  Social History Narrative  . Not on file    Family History:   Family History  Problem Relation Age of Onset  . Hypertension Mother   . Hypertension Father      ROS:  Please see the history of present illness.  All other ROS reviewed and negative.     Physical Exam/Data:   Vitals:   05/26/19 0500 05/26/19 0827 05/26/19 1102 05/26/19 1223  BP:  107/65  106/62  Pulse:  (!) 115 90 88  Resp:  20  16  Temp:  97.7 F (36.5 C)  97.6 F (36.4 C)  TempSrc:  Oral  Oral  SpO2:  100%  96%  Weight: 84.2 kg     Height:         Intake/Output Summary (Last 24 hours) at 05/26/2019 1341 Last data filed at 05/26/2019 1319 Gross per 24 hour  Intake 420 ml  Output 2350 ml  Net -1930 ml   Last 3 Weights 05/26/2019 05/25/2019 05/25/2019  Weight (lbs) 185 lb 10 oz 191 lb 3.2 oz 194 lb  Weight (kg) 84.2 kg 86.728 kg 87.998 kg     Body mass index is 30.89 kg/m.  General:  Well nourished, well developed, in no acute distress HEENT: normal Lymph: no adenopathy Neck: no JVD Endocrine:  No thryomegaly Vascular: No carotid bruits; FA pulses 2+ bilaterally without bruits  Cardiac: Tachycardic, irregular Lungs:  clear to auscultation bilaterally, no wheezing, rhonchi or rales  Abd: soft, nontender, no hepatomegaly  Ext: no edema Musculoskeletal:  No deformities, BUE and BLE strength normal and equal Skin: warm and dry  Neuro:  CNs 2-12 intact, no focal abnormalities noted Psych:  Normal affect   EKG:  The EKG was personally reviewed and demonstrates:   #1 is ATach, 121bpm, some A paced beats, QT is difficult to assess  05/22/2019 is AP/VS 76bpm, QT is 449ms, QTc, QTc is 474ms  Telemetry:  Telemetry was personally reviewed and demonstrates: Atrial fibrillation with intermittent ventricular pacing, short bursts of sinus rhythm    Relevant CV Studies:  12/07/17: TTE Study Conclusions - Left ventricle: The cavity size was normal. There was mild focal   basal hypertrophy of the septum. Systolic function was mildly to   moderately reduced. The estimated ejection fraction was in the   range of 40% to 45%. Mild diffuse  hypokinesis. Features are   consistent with a pseudonormal left ventricular filling pattern,   with concomitant abnormal relaxation and increased filling   pressure (grade 2 diastolic dysfunction). - Aortic valve: Trileaflet; mildly thickened, mildly calcified   leaflets. - Mitral valve: Calcified annulus. There was mild to moderate   regurgitation. - Left atrium: The atrium was severely dilated.   Laboratory Data:  High Sensitivity Troponin:  No results for input(s): TROPONINIHS in the last 720 hours.   Cardiac EnzymesNo results for input(s): TROPONINI in the last 168 hours. No results for input(s): TROPIPOC in the last 168 hours.  Chemistry Recent Labs  Lab 05/25/19 1337 05/26/19 0606  NA 138 140  K 4.4 3.9  CL 109 109  CO2 21* 22  GLUCOSE 122* 100*  BUN 16 17  CREATININE 1.29* 1.28*  CALCIUM 10.3 10.1  GFRNONAA 38* 38*  GFRAA 44* 44*  ANIONGAP 8 9    No results for input(s): PROT, ALBUMIN, AST, ALT, ALKPHOS, BILITOT in the last 168 hours. Hematology Recent Labs  Lab 05/25/19 1337  WBC 8.3  RBC 3.18*  HGB 9.7*  HCT 31.5*  MCV 99.1  MCH 30.5  MCHC 30.8  RDW 15.1  PLT 243   BNP Recent Labs  Lab 05/25/19 1337  BNP 543.7*    DDimer No results for input(s): DDIMER in the last 168 hours.   Radiology/Studies:   Dg Chest 2 View Result Date: 05/25/2019 CLINICAL DATA:  Cough and short of breath EXAM: CHEST - 2 VIEW COMPARISON:  02/22/2017 FINDINGS: Cardiac enlargement without heart failure. Dual lead pacemaker unchanged. Decreased lung volume with mild bibasilar atelectasis. Small pleural effusions. Lower thoracic compression fractures appear chronic. IMPRESSION: Hypoventilation with bibasilar atelectasis. Negative for heart failure. Electronically Signed   By: Franchot Gallo M.D.   On: 05/25/2019 14:48    Assessment and Plan:   1. Paroxysmal; AFib 2. ATachycardia     Very symptomatic with both     CHA2DS2Vasc is 5, on warfarin  AT this juncture, feel she has failed Tikosyn.  She has been on and failed amiodarone and sotalol in the past as well. She may be at AV node ablation.   For questions or updates, please contact [place] Please consult [email or web address] for contact info under     Signed, Baldwin Jamaica, PA-C  05/26/2019 1:41 PM  I have seen and examined this patient with Tommye Standard.  Agree with above, note added to reflect my  findings.  On exam, tachycardic, irregualr, lungs clear.  Patient with rapid atrial fibrillation as well as atrial tachycardia.  At this point, she is on dofetilide 250 mcg.  This may be a failure of dofetilide therapy.  She has had amiodarone in the past but per report has had some lung injury as well as some tremulousness.  At this point, would continue her on dofetilide through the weekend.  If she does not convert and stay in sinus rhythm, she would likely be an AV node ablation on Monday.  Cleo Santucci M. Starlett Pehrson MD 05/26/2019 2:56 PM

## 2019-05-26 NOTE — Progress Notes (Signed)
Pt's HR is elevated in the 120's to 140's at the moment, was in the 160's when she got up earlier. She denies any discomfort. MD on-call has been paged for the elevated HR, waiting for response

## 2019-05-26 NOTE — Progress Notes (Signed)
Patient is experiencing SOB and Dizziness Round Md's Cardiologist and EP aware and treating with Meds.

## 2019-05-26 NOTE — Progress Notes (Signed)
  Echocardiogram 2D Echocardiogram has been performed.  Molly [name] 05/26/2019, 9:51 AM

## 2019-05-26 NOTE — Progress Notes (Addendum)
Progress Note  Patient Name: Molly [name] Date of Encounter: 05/26/2019  Primary Cardiologist: Fransico Him, MD  Electrophysiologist: [name]  Subjective   Still feeling weak. No pain.   Inpatient Medications    Scheduled Meds: . carvedilol  1.5625 mg Oral BID WC  . dofetilide  250 mcg Oral Q12H  . dorzolamide  1 drop Right Eye BID  . famotidine  20 mg Oral BID  . furosemide  40 mg Intravenous BID  . LORazepam  0.5 mg Oral QHS  . magnesium oxide  200 mg Oral Daily  . sodium chloride flush  3 mL Intravenous Q12H  . warfarin  2.5 mg Oral q1800  . Warfarin - Physician Dosing Inpatient   Does not apply q1800   Continuous Infusions: . sodium chloride     PRN Meds: sodium chloride, acetaminophen, ondansetron (ZOFRAN) IV, sodium chloride flush   Vital Signs    Vitals:   05/26/19 0054 05/26/19 0310 05/26/19 0500 05/26/19 0827  BP: 120/76 120/82  107/65  Pulse: (!) 102 95  (!) 115  Resp: 18   20  Temp: 98 F (36.7 C) (!) 97.4 F (36.3 C)  97.7 F (36.5 C)  TempSrc: Oral Oral  Oral  SpO2: 96% 99%  100%  Weight:   84.2 kg   Height:        Intake/Output Summary (Last 24 hours) at 05/26/2019 0828 Last data filed at 05/26/2019 0500 Gross per 24 hour  Intake -  Output 1050 ml  Net -1050 ml   Last 3 Weights 05/26/2019 05/25/2019 05/25/2019  Weight (lbs) 185 lb 10 oz 191 lb 3.2 oz 194 lb  Weight (kg) 84.2 kg 86.728 kg 87.998 kg      Telemetry    Atrial fib, rate 130s- Personally Reviewed  ECG    No AM EKG - Personally Reviewed  Physical Exam   General: Well developed, well nourished, NAD  HEENT: OP clear, mucus membranes moist  SKIN: warm, dry. No rashes. Neuro: No focal deficits  Musculoskeletal: Muscle strength 5/5 all ext  Psychiatric: Mood and affect normal  Neck: No JVD, no carotid bruits, no thyromegaly, no lymphadenopathy.  Lungs:Clear bilaterally, no wheezes, rhonci, crackles Cardiovascular: Irreg irreg. Systolic murmur noted.  Abdomen:Soft.  Bowel sounds present. Non-tender.  Extremities: Trace lower extremity edema.  Labs    High Sensitivity Troponin:  No results for input(s): TROPONINIHS in the last 720 hours.    Cardiac EnzymesNo results for input(s): TROPONINI in the last 168 hours. No results for input(s): TROPIPOC in the last 168 hours.   Chemistry Recent Labs  Lab 05/25/19 1337 05/26/19 0606  NA 138 140  K 4.4 3.9  CL 109 109  CO2 21* 22  GLUCOSE 122* 100*  BUN 16 17  CREATININE 1.29* 1.28*  CALCIUM 10.3 10.1  GFRNONAA 38* 38*  GFRAA 44* 44*  ANIONGAP 8 9     Hematology Recent Labs  Lab 05/25/19 1337  WBC 8.3  RBC 3.18*  HGB 9.7*  HCT 31.5*  MCV 99.1  MCH 30.5  MCHC 30.8  RDW 15.1  PLT 243    BNP Recent Labs  Lab 05/25/19 1337  BNP 543.7*     DDimer No results for input(s): DDIMER in the last 168 hours.   Radiology    Dg Chest 2 View  Result Date: 05/25/2019 CLINICAL DATA:  Cough and short of breath EXAM: CHEST - 2 VIEW COMPARISON:  02/22/2017 FINDINGS: Cardiac enlargement without heart failure. Dual lead pacemaker unchanged.  Decreased lung volume with mild bibasilar atelectasis. Small pleural effusions. Lower thoracic compression fractures appear chronic. IMPRESSION: Hypoventilation with bibasilar atelectasis. Negative for heart failure. Electronically Signed   By: Franchot Gallo M.D.   On: 05/25/2019 14:48    Cardiac Studies    Patient Profile     85 y.o. female with history of mild LV systolic dysfunction, paroxysmal atrial fibrillation, atrial tachycardia, tachy-brady syndrome s/p pacemaker, HTN, hyperlipidemia and chronic combined diastolic and systolic CHF being admitted with c/o fatigue and dyspnea. She has gained 7 lbs over the past few weeks. She is found to be in atrial fibrillation with RVR in the ED. She converted to sinus in the ED but then back to atrial fibrillation on the telemetry unit. BNP elevated. IV Lasix started at time of admission.   Assessment & Plan    1.  Acute on chronic systolic CHF: Good diuresis overnight with IV lasix. She is negative one liter. Still with mild volume overload. Will continue IV Lasix today.   2. Sinus node dysfunction: PPM in place  3. NICM: Echo today  4. Atrial fib with RVR: She converted to sinus last night but then back to atrial fibrillation. She is on coumadin, dofetilide and Coreg. Her heart rate this am is 130s. Will ask our EP team to see her today. EKG this am is pending. She has been seen in the past by Dr. Lennie Odor and discussed ablation. Pacemaker was interrogated 2 weeks ago.   5. Mitral valve regurgitation: mild to moderate by echo in February 2019. Will repeat echo today.   6. Aortic valve insufficiency: Mild by echo in February 2019. Will repeat echo today.    She will require inpatient status and ongoing admission for diuresis and management of her atrial fibrillation.   For questions or updates, please contact [place] Please consult [email or web address] for contact info under    Signed, Lauree Chandler, MD  05/26/2019, 8:28 AM

## 2019-05-27 ENCOUNTER — Other Ambulatory Visit: Payer: Self-pay | Admitting: Physician Assistant

## 2019-05-27 DIAGNOSIS — I4819 Other persistent atrial fibrillation: Secondary | ICD-10-CM

## 2019-05-27 LAB — CBC
HCT: 31.7 % — ABNORMAL LOW (ref 36.0–46.0)
Hemoglobin: 10 g/dL — ABNORMAL LOW (ref 12.0–15.0)
MCH: 30.7 pg (ref 26.0–34.0)
MCHC: 31.5 g/dL (ref 30.0–36.0)
MCV: 97.2 fL (ref 80.0–100.0)
Platelets: 230 10*3/uL (ref 150–400)
RBC: 3.26 MIL/uL — ABNORMAL LOW (ref 3.87–5.11)
RDW: 15.1 % (ref 11.5–15.5)
WBC: 7.8 10*3/uL (ref 4.0–10.5)
nRBC: 0 % (ref 0.0–0.2)

## 2019-05-27 LAB — BASIC METABOLIC PANEL
Anion gap: 11 (ref 5–15)
BUN: 21 mg/dL (ref 8–23)
CO2: 22 mmol/L (ref 22–32)
Calcium: 10.3 mg/dL (ref 8.9–10.3)
Chloride: 105 mmol/L (ref 98–111)
Creatinine, Ser: 1.61 mg/dL — ABNORMAL HIGH (ref 0.44–1.00)
GFR calc Af Amer: 34 mL/min — ABNORMAL LOW (ref >60)
GFR calc non Af Amer: 29 mL/min — ABNORMAL LOW (ref >60)
Glucose, Bld: 111 mg/dL — ABNORMAL HIGH (ref 70–99)
Potassium: 3.9 mmol/L (ref 3.5–5.1)
Sodium: 138 mmol/L (ref 135–145)

## 2019-05-27 LAB — PROTIME-INR
INR: 2.7 — ABNORMAL HIGH (ref 0.8–1.2)
Prothrombin Time: 28.2 seconds — ABNORMAL HIGH (ref 11.4–15.2)

## 2019-05-27 MED ORDER — FAMOTIDINE 20 MG PO TABS
20.0000 mg | ORAL_TABLET | Freq: Every day | ORAL | Status: DC
  Administered 2019-05-27 – 2019-05-30 (×4): 20 mg via ORAL
  Filled 2019-05-27 (×5): qty 1

## 2019-05-27 NOTE — Progress Notes (Signed)
Progress Note  Patient Name: Molly [name] Date of Encounter: 05/27/2019  Primary Cardiologist: Fransico Him, MD   Subjective   Ongoing fatigue  Inpatient Medications    Scheduled Meds: . carvedilol  1.5625 mg Oral BID WC  . dofetilide  250 mcg Oral Q12H  . dorzolamide  1 drop Right Eye BID  . famotidine  20 mg Oral Daily  . furosemide  40 mg Intravenous BID  . LORazepam  0.5 mg Oral QHS  . magnesium oxide  200 mg Oral Daily  . sodium chloride flush  3 mL Intravenous Q12H  . warfarin  2.5 mg Oral q1800  . Warfarin - Physician Dosing Inpatient   Does not apply q1800   Continuous Infusions: . sodium chloride     PRN Meds: sodium chloride, acetaminophen, metoprolol tartrate, ondansetron (ZOFRAN) IV, sodium chloride flush   Vital Signs    Vitals:   05/26/19 2241 05/27/19 0524 05/27/19 0839 05/27/19 0849  BP: 105/77 94/81 (!) 78/53 (!) 118/52  Pulse: (!) 106 78 75   Resp:  16 20   Temp:  98 F (36.7 C) 98.7 F (37.1 C)   TempSrc:   Oral   SpO2:  93% 97%   Weight:  83.8 kg    Height:        Intake/Output Summary (Last 24 hours) at 05/27/2019 0909 Last data filed at 05/27/2019 0858 Gross per 24 hour  Intake 540 ml  Output 3250 ml  Net -2710 ml   Last 3 Weights 05/27/2019 05/26/2019 05/25/2019  Weight (lbs) 184 lb 11.2 oz 185 lb 10 oz 191 lb 3.2 oz  Weight (kg) 83.779 kg 84.2 kg 86.728 kg      Telemetry    afib variable rates- Personally Reviewed  ECG    n/a - Personally Reviewed  Physical Exam   GEN: No acute distress.   Neck: No JVD Cardiac: irreg,  Respiratory: Clear to auscultation bilaterally. GI: Soft, nontender, non-distended  MS: No edema; No deformity. Neuro:  Nonfocal  Psych: Normal affect   Labs    High Sensitivity Troponin:  No results for input(s): TROPONINIHS in the last 720 hours.    Cardiac EnzymesNo results for input(s): TROPONINI in the last 168 hours. No results for input(s): TROPIPOC in the last 168 hours.   Chemistry  Recent Labs  Lab 05/25/19 1337 05/26/19 0606 05/27/19 0004  NA 138 140 138  K 4.4 3.9 3.9  CL 109 109 105  CO2 21* 22 22  GLUCOSE 122* 100* 111*  BUN 16 17 21   CREATININE 1.29* 1.28* 1.61*  CALCIUM 10.3 10.1 10.3  GFRNONAA 38* 38* 29*  GFRAA 44* 44* 34*  ANIONGAP 8 9 11      Hematology Recent Labs  Lab 05/25/19 1337 05/27/19 0004  WBC 8.3 7.8  RBC 3.18* 3.26*  HGB 9.7* 10.0*  HCT 31.5* 31.7*  MCV 99.1 97.2  MCH 30.5 30.7  MCHC 30.8 31.5  RDW 15.1 15.1  PLT 243 230    BNP Recent Labs  Lab 05/25/19 1337  BNP 543.7*     DDimer No results for input(s): DDIMER in the last 168 hours.   Radiology    Dg Chest 2 View  Result Date: 05/25/2019 CLINICAL DATA:  Cough and short of breath EXAM: CHEST - 2 VIEW COMPARISON:  02/22/2017 FINDINGS: Cardiac enlargement without heart failure. Dual lead pacemaker unchanged. Decreased lung volume with mild bibasilar atelectasis. Small pleural effusions. Lower thoracic compression fractures appear chronic. IMPRESSION: Hypoventilation with bibasilar  atelectasis. Negative for heart failure. Electronically Signed   By: Franchot Gallo M.D.   On: 05/25/2019 14:48    Cardiac Studies    Patient Profile     85 y.o. female with history of mild LV systolic dysfunction, paroxysmal atrial fibrillation, atrial tachycardia, tachy-brady syndrome s/p pacemaker, HTN, hyperlipidemia and chronic combined diastolic and systolic CHF being admitted with c/ofatigue and dyspnea. She has gained 7 lbs over the past few weeks.She is found to be in atrial fibrillation with RVR in the ED. She converted to sinus in the ED but then back to atrial fibrillation on the telemetry unit. BNP elevated. IV Lasix started at time of admission.   Assessment & Plan    1. Acute on chronic systolic HF - 12/8331 echo LVEF 30-35% (11/2018 echo LVEF 40-45%) - admitted with 7 lbs weight gain, dyspnea - neg 1.9 L yesterday, neg 2.9 L since admission. Significant uptrend in Cr.  She is on lasix 40mg  IV bid, has received AM dose, hold PM dose and follow renal function - perhaps drop in LVEF is tachymediated.   - appears near euvolemic, follow labs tomorrow. Potentially oral diuretic tomorrow.   2. Tachy-brady - has pacemaker in place   3. PAF and atach - issues with PAF with RVR this admission - has been on dofetilide and coreg.  - seen by EP this admit, from notes she has previously failed amio and sotalol and currently failing dofetilide.  - plans to follow rhythm over the weekend, possibly av node ablation Monday - on very low doses of beta blocker presume due to soft bp's at times.   - EP to reevaluate Monday, will make npo Sunday night in case decided for procedure Monday  4. Valvular heart diseaes - 04/2019 echo moderate MR, mild AI, mild TR  For questions or updates, please contact [place] Please consult [email or web address] for contact info under        Signed, Carlyle Dolly, MD  05/27/2019, 9:09 AM

## 2019-05-28 ENCOUNTER — Inpatient Hospital Stay (HOSPITAL_COMMUNITY): Payer: Medicare Other

## 2019-05-28 LAB — BASIC METABOLIC PANEL
Anion gap: 11 (ref 5–15)
BUN: 28 mg/dL — ABNORMAL HIGH (ref 8–23)
CO2: 24 mmol/L (ref 22–32)
Calcium: 10.2 mg/dL (ref 8.9–10.3)
Chloride: 104 mmol/L (ref 98–111)
Creatinine, Ser: 1.69 mg/dL — ABNORMAL HIGH (ref 0.44–1.00)
GFR calc Af Amer: 32 mL/min — ABNORMAL LOW (ref >60)
GFR calc non Af Amer: 27 mL/min — ABNORMAL LOW (ref >60)
Glucose, Bld: 107 mg/dL — ABNORMAL HIGH (ref 70–99)
Potassium: 3.7 mmol/L (ref 3.5–5.1)
Sodium: 139 mmol/L (ref 135–145)

## 2019-05-28 LAB — MAGNESIUM: Magnesium: 1.8 mg/dL (ref 1.7–2.4)

## 2019-05-28 LAB — PROTIME-INR
INR: 2.3 — ABNORMAL HIGH (ref 0.8–1.2)
Prothrombin Time: 24.5 seconds — ABNORMAL HIGH (ref 11.4–15.2)

## 2019-05-28 MED ORDER — POTASSIUM CHLORIDE CRYS ER 20 MEQ PO TBCR
40.0000 meq | EXTENDED_RELEASE_TABLET | Freq: Once | ORAL | Status: AC
  Administered 2019-05-28: 40 meq via ORAL
  Filled 2019-05-28: qty 2

## 2019-05-28 NOTE — Progress Notes (Signed)
Spoke with patient's daughter Olin Hauser today, strongly wishes for her father to be present during rounds tomorrow for discussion of possible ablation procedure. Discussed with AC and approved, I also notified the 3E floor. Riva Sesma will be granted access to floor tomorrow morning around 7AM.   Carlyle Dolly MD

## 2019-05-28 NOTE — Progress Notes (Signed)
Progress Note  Patient Name: Molly [name] Date of Encounter: 05/28/2019  Primary Cardiologist: Fransico Him, MD   Subjective   Ongoing fatigue  Inpatient Medications    Scheduled Meds: . carvedilol  1.5625 mg Oral BID WC  . dofetilide  250 mcg Oral Q12H  . dorzolamide  1 drop Right Eye BID  . famotidine  20 mg Oral Daily  . LORazepam  0.5 mg Oral QHS  . magnesium oxide  200 mg Oral Daily  . sodium chloride flush  3 mL Intravenous Q12H  . warfarin  2.5 mg Oral q1800  . Warfarin - Physician Dosing Inpatient   Does not apply q1800   Continuous Infusions: . sodium chloride     PRN Meds: sodium chloride, acetaminophen, ondansetron (ZOFRAN) IV, sodium chloride flush   Vital Signs    Vitals:   05/27/19 1344 05/27/19 2057 05/27/19 2110 05/28/19 0510  BP: 94/60 112/73  (!) 115/93  Pulse: 74 80 (!) 142 (!) 107  Resp: 20 18  18   Temp: 98.7 F (37.1 C)   97.7 F (36.5 C)  TempSrc: Oral   Oral  SpO2: 95% 96%    Weight:    83.5 kg  Height:        Intake/Output Summary (Last 24 hours) at 05/28/2019 0944 Last data filed at 05/28/2019 5631 Gross per 24 hour  Intake 240 ml  Output 1400 ml  Net -1160 ml   Last 3 Weights 05/28/2019 05/27/2019 05/26/2019  Weight (lbs) 184 lb 1.6 oz 184 lb 11.2 oz 185 lb 10 oz  Weight (kg) 83.507 kg 83.779 kg 84.2 kg      Telemetry    afib RVR - Personally Reviewed  ECG    afib RVR - Personally Reviewed  Physical Exam   GEN: No acute distress.   Neck: No JVD Cardiac: irregular, tachy, no murmurs, rubs, or gallops.  Respiratory: Clear to auscultation bilaterally. GI: Soft, nontender, non-distended  MS: No edema; No deformity. Neuro:  Nonfocal  Psych: Normal affect   Labs    High Sensitivity Troponin:  No results for input(s): TROPONINIHS in the last 720 hours.    Cardiac EnzymesNo results for input(s): TROPONINI in the last 168 hours. No results for input(s): TROPIPOC in the last 168 hours.   Chemistry Recent Labs  Lab  05/26/19 0606 05/27/19 0004 05/28/19 0529  NA 140 138 139  K 3.9 3.9 3.7  CL 109 105 104  CO2 22 22 24   GLUCOSE 100* 111* 107*  BUN 17 21 28*  CREATININE 1.28* 1.61* 1.69*  CALCIUM 10.1 10.3 10.2  GFRNONAA 38* 29* 27*  GFRAA 44* 34* 32*  ANIONGAP 9 11 11      Hematology Recent Labs  Lab 05/25/19 1337 05/27/19 0004  WBC 8.3 7.8  RBC 3.18* 3.26*  HGB 9.7* 10.0*  HCT 31.5* 31.7*  MCV 99.1 97.2  MCH 30.5 30.7  MCHC 30.8 31.5  RDW 15.1 15.1  PLT 243 230    BNP Recent Labs  Lab 05/25/19 1337  BNP 543.7*     DDimer No results for input(s): DDIMER in the last 168 hours.   Radiology    No results found.  Cardiac Studies     Patient Profile     85 y.o.femalewith history of mild LV systolic dysfunction, paroxysmal atrial fibrillation, atrial tachycardia, tachy-brady syndrome s/p pacemaker, HTN, hyperlipidemia and chronic combined diastolic and systolic CHF being admitted with c/ofatigue and dyspnea. She has gained 7 lbs over the past few  weeks.She is found to be in atrial fibrillation with RVR in the ED. She converted to sinus in the ED but then back to atrial fibrillation on the telemetry unit. BNP elevated. IV Lasix started at time of admission.  Assessment & Plan    1. Acute on chronic systolic HF - 0/6237 echo LVEF 30-35% (11/2018 echo LVEF 40-45%) - admitted with 7 lbs weight gain, dyspnea - neg 1.8 L yesterday, neg 4.6 L since admission. Significant uptrend in Cr, hold diuretic today.  - perhaps drop in LVEF is tachymediated.    2. Tachy-brady - has pacemaker in place   3. PAF and atach - issues with PAF with RVR this admission - has been on dofetilide and coreg.  - seen by EP this admit, from notes she has previously failed amio and sotalol and currently failing dofetilide.  - plans to follow rhythm over the weekend, possibly av node ablation Monday - on very low doses of beta blocker  due to soft bp's at times.   - EP to reevaluate  Monday, will make npo Sunday night in case decided for procedure Monday - QTc by manual measure today 490 in setting of IVCD, longer by compute measure.   - prolonging QTc in setting of AKI on tikasyn, tikasyn therapy has failed. Will d/c at this time. Likely av nodal ablation tomorrow.     4. Valvular heart diseaes - 04/2019 echo moderate MR, mild AI, mild TR  5. AKI - secondary to diuretics, we will hold today.    6. Cough - dry nonproductive cough - repeat CXR.     Patient with recent loss of her sister after long admission at [facility] when family was not able to visit. Spoke with daughter Zetta Bills today and they feel strongly about having the patient's husband present tomorrow for the discussion about the av nodal ablation. Will discus with floor managers getting this arrange.   For questions or updates, please contact [place] Please consult [email or web address] for contact info under        Signed, Carlyle Dolly, MD  05/28/2019, 9:44 AM

## 2019-05-29 ENCOUNTER — Encounter (HOSPITAL_COMMUNITY): Payer: Self-pay | Admitting: General Practice

## 2019-05-29 ENCOUNTER — Encounter (HOSPITAL_COMMUNITY)
Admission: EM | Disposition: A | Discharge status: Home Health | Payer: Self-pay | Source: Home / Self Care | Attending: Cardiovascular Disease

## 2019-05-29 DIAGNOSIS — I4819 Other persistent atrial fibrillation: Principal | ICD-10-CM

## 2019-05-29 DIAGNOSIS — I482 Chronic atrial fibrillation, unspecified: Secondary | ICD-10-CM

## 2019-05-29 HISTORY — PX: ATRIAL FIBRILLATION ABLATION: EP1191

## 2019-05-29 LAB — BASIC METABOLIC PANEL
Anion gap: 7 (ref 5–15)
BUN: 26 mg/dL — ABNORMAL HIGH (ref 8–23)
CO2: 24 mmol/L (ref 22–32)
Calcium: 10.3 mg/dL (ref 8.9–10.3)
Chloride: 108 mmol/L (ref 98–111)
Creatinine, Ser: 1.5 mg/dL — ABNORMAL HIGH (ref 0.44–1.00)
GFR calc Af Amer: 37 mL/min — ABNORMAL LOW (ref >60)
GFR calc non Af Amer: 32 mL/min — ABNORMAL LOW (ref >60)
Glucose, Bld: 103 mg/dL — ABNORMAL HIGH (ref 70–99)
Potassium: 4.3 mmol/L (ref 3.5–5.1)
Sodium: 139 mmol/L (ref 135–145)

## 2019-05-29 LAB — PROTIME-INR
INR: 2.3 — ABNORMAL HIGH (ref 0.8–1.2)
Prothrombin Time: 24.9 seconds — ABNORMAL HIGH (ref 11.4–15.2)

## 2019-05-29 SURGERY — ATRIAL FIBRILLATION ABLATION
Anesthesia: LOCAL

## 2019-05-29 MED ORDER — FENTANYL CITRATE (PF) 100 MCG/2ML IJ SOLN
INTRAMUSCULAR | Status: DC | PRN
  Administered 2019-05-29: 25 ug via INTRAVENOUS

## 2019-05-29 MED ORDER — BUPIVACAINE HCL (PF) 0.25 % IJ SOLN
INTRAMUSCULAR | Status: AC
  Filled 2019-05-29: qty 60

## 2019-05-29 MED ORDER — BUPIVACAINE HCL (PF) 0.25 % IJ SOLN
INTRAMUSCULAR | Status: DC | PRN
  Administered 2019-05-29: 30 mL

## 2019-05-29 MED ORDER — MIDAZOLAM HCL 5 MG/5ML IJ SOLN
INTRAMUSCULAR | Status: DC | PRN
  Administered 2019-05-29: 2 mg via INTRAVENOUS

## 2019-05-29 MED ORDER — MIDAZOLAM HCL 5 MG/5ML IJ SOLN
INTRAMUSCULAR | Status: AC
  Filled 2019-05-29: qty 5

## 2019-05-29 MED ORDER — FENTANYL CITRATE (PF) 100 MCG/2ML IJ SOLN
INTRAMUSCULAR | Status: AC
  Filled 2019-05-29: qty 2

## 2019-05-29 MED ORDER — HEPARIN (PORCINE) IN NACL 1000-0.9 UT/500ML-% IV SOLN
INTRAVENOUS | Status: DC | PRN
  Administered 2019-05-29: 500 mL

## 2019-05-29 MED ORDER — SODIUM CHLORIDE 0.9 % IV SOLN: INTRAVENOUS | Status: DC

## 2019-05-29 MED ORDER — ONDANSETRON HCL 4 MG/2ML IJ SOLN: 4.0000 mg | Freq: Four times a day (QID) | INTRAMUSCULAR | Status: DC | PRN

## 2019-05-29 MED ORDER — SODIUM CHLORIDE 0.9% FLUSH: 3.0000 mL | INTRAVENOUS | Status: DC | PRN

## 2019-05-29 MED ORDER — CARVEDILOL 3.125 MG PO TABS: 3.1250 mg | ORAL_TABLET | Freq: Two times a day (BID) | ORAL | Status: DC

## 2019-05-29 MED ORDER — SODIUM CHLORIDE 0.9% FLUSH
3.0000 mL | Freq: Two times a day (BID) | INTRAVENOUS | Status: DC
  Administered 2019-05-29 – 2019-05-30 (×2): 3 mL via INTRAVENOUS

## 2019-05-29 MED ORDER — ACETAMINOPHEN 325 MG PO TABS
650.0000 mg | ORAL_TABLET | ORAL | Status: DC | PRN
  Administered 2019-05-29: 650 mg via ORAL
  Filled 2019-05-29: qty 2

## 2019-05-29 MED ORDER — SODIUM CHLORIDE 0.9 % IV SOLN: 250.0000 mL | INTRAVENOUS | Status: DC | PRN

## 2019-05-29 MED ORDER — HEPARIN (PORCINE) IN NACL 1000-0.9 UT/500ML-% IV SOLN
INTRAVENOUS | Status: AC
  Filled 2019-05-29: qty 500

## 2019-05-29 SURGICAL SUPPLY — 5 items
CATH CELSIUS THERMO F CV 7FR (ABLATOR) ×1 IMPLANT
PACK EP LATEX FREE (CUSTOM PROCEDURE TRAY) ×1
PACK EP LF (CUSTOM PROCEDURE TRAY) ×1 IMPLANT
PAD PRO RADIOLUCENT 2001M-C (PAD) ×2 IMPLANT
SHEATH PINNACLE 8F 10CM (SHEATH) ×1 IMPLANT

## 2019-05-29 NOTE — Progress Notes (Signed)
Patient refusing bed alarm for the night stating she will only transfer to the [facility] without calling for help.  This RN educated patient on safety precautions.  This patient still requests bed alarm to be left off.

## 2019-05-29 NOTE — Progress Notes (Addendum)
Electrophysiology Rounding Note  Patient Name: Molly [name] Date of Encounter: 05/29/2019  Primary Cardiologist: Fransico Him, MD Electrophysiologist: Dr. [name]  Subjective   Feeling Ok this am. Remains fatigue. Husband present. Questions answered about the procedure.   Inpatient Medications    Scheduled Meds: . carvedilol  1.5625 mg Oral BID WC  . dorzolamide  1 drop Right Eye BID  . famotidine  20 mg Oral Daily  . LORazepam  0.5 mg Oral QHS  . magnesium oxide  200 mg Oral Daily  . sodium chloride flush  3 mL Intravenous Q12H  . warfarin  2.5 mg Oral q1800  . Warfarin - Physician Dosing Inpatient   Does not apply q1800   Continuous Infusions: . sodium chloride     PRN Meds: sodium chloride, acetaminophen, ondansetron (ZOFRAN) IV, sodium chloride flush   Vital Signs    Vitals:   05/28/19 1208 05/28/19 1753 05/28/19 2302 05/29/19 0404  BP: (!) 90/58 134/79 108/63 113/70  Pulse: 78 (!) 116 (!) 102 61  Resp: 20  18 18   Temp: 98.4 F (36.9 C)  97.6 F (36.4 C) 97.7 F (36.5 C)  TempSrc: Oral   Oral  SpO2: 97%  98% 99%  Weight:    83.3 kg  Height:        Intake/Output Summary (Last 24 hours) at 05/29/2019 0713 Last data filed at 05/29/2019 0500 Gross per 24 hour  Intake 243 ml  Output 750 ml  Net -507 ml   Filed Weights   05/27/19 0524 05/28/19 0510 05/29/19 0404  Weight: 83.8 kg 83.5 kg 83.3 kg    Physical Exam    GEN- The patient is elderly appearing, alert and oriented x 3 today.   Head- normocephalic, atraumatic Eyes-  Sclera clear, conjunctiva pink Ears- hearing intact Oropharynx- clear Neck- supple Lungs- Clear to ausculation bilaterally, normal work of breathing Heart- Regular rate and rhythm, no murmurs, rubs or gallops GI- soft, NT, ND, + BS Extremities- no clubbing, cyanosis, or edema Skin- no rash or lesion Psych- euthymic mood, full affect Neuro- strength and sensation are intact  Labs    CBC Recent Labs    05/27/19 0004  WBC  7.8  HGB 10.0*  HCT 31.7*  MCV 97.2  PLT 335   Basic Metabolic Panel Recent Labs    05/27/19 0004 05/28/19 0529  NA 138 139  K 3.9 3.7  CL 105 104  CO2 22 24  GLUCOSE 111* 107*  BUN 21 28*  CREATININE 1.61* 1.69*  CALCIUM 10.3 10.2  MG  --  1.8   Liver Function Tests No results for input(s): AST, ALT, ALKPHOS, BILITOT, PROT, ALBUMIN in the last 72 hours. No results for input(s): LIPASE, AMYLASE in the last 72 hours. Cardiac Enzymes No results for input(s): CKTOTAL, CKMB, CKMBINDEX, TROPONINI in the last 72 hours. BNP Invalid input(s): POCBNP D-Dimer No results for input(s): DDIMER in the last 72 hours. Hemoglobin A1C No results for input(s): HGBA1C in the last 72 hours. Fasting Lipid Panel No results for input(s): CHOL, HDL, LDLCALC, TRIG, CHOLHDL, LDLDIRECT in the last 72 hours. Thyroid Function Tests No results for input(s): TSH, T4TOTAL, T3FREE, THYROIDAB in the last 72 hours.  Invalid input(s): FREET3  Telemetry    Afib vs AT in 110-120s, up to 130-140s at times (personally reviewed)  Radiology    Dg Chest Port 1 View  Result Date: 05/28/2019 CLINICAL DATA:  Cough EXAM: PORTABLE CHEST 1 VIEW COMPARISON:  Chest radiograph 05/25/2019 FINDINGS: Multi  lead pacer apparatus overlies the left hemithorax, leads are stable in position. Stable cardiac and mediastinal contours. Minimal heterogeneous opacities left lung base. No pleural effusion or pneumothorax. Thoracic spine degenerative changes. IMPRESSION: Minimal heterogeneous opacities left lung base may represent atelectasis or infection. Electronically Signed   By: Lovey Newcomer M.D.   On: 05/28/2019 11:34     Patient Profile     Molly [name] is a 85 y.o. female with a hx of DCM, chronic CHF (systolic), HTN, tachy-brady, sinus node dysfunction with PPM, paroxysmal Afib, ATach, a remote h/p frequent PVCs as well, who is being seen today for the evaluation of recurrent symptomatic AFib.   Assessment & Plan     1. Paroxysmal Afib  - Appears to have failed dofetilide. Previously on amiodarone with ? Lung injury and + tremors and failed sotalol due to fatigue/malaise  2. ATachycardia - She is very symptomatic with both CHA2DS2/VASc is 5. On coumadin.   3. S/p Bost-Sci Dual Chamber PPM for tachy-brady and SND - Normal function at last check 05/09/2019     For questions or updates, please contact [place] Please consult [email or web address] for contact info under Cardiology/STEMI.  Signed, Shirley Friar, PA-C  05/29/2019, 7:13 AM    EP Attending  Patient seen and examined. Agree with the findings as noted above. The patient has developed worsening Chf in the setting of uncontrolled atrial fib despite medical therapy. She has a resting hr of 120. Her EF is down. I have discussed the indications/risks/benefits/goals/expectations of AV node ablation with the patient and her husband and she wishes to proceed.  Mikle Bosworth.D.

## 2019-05-29 NOTE — Interval H&P Note (Signed)
History and Physical Interval Note:  05/29/2019 2:40 PM  Molly [name]  has presented today for surgery, with the diagnosis of afib.  The various methods of treatment have been discussed with the patient and family. After consideration of risks, benefits and other options for treatment, the patient has consented to  Procedure(s): AVN ABLATION (N/A) as a surgical intervention.  The patient's history has been reviewed, patient examined, no change in status, stable for surgery.  I have reviewed the patient's chart and labs.  Questions were answered to the patient's satisfaction.     [name]

## 2019-05-29 NOTE — H&P (View-Only) (Signed)
Electrophysiology Rounding Note  Patient Name: Molly [name] Date of Encounter: 05/29/2019  Primary Cardiologist: Fransico Him, MD Electrophysiologist: Dr. [name]  Subjective   Feeling Ok this am. Remains fatigue. Husband present. Questions answered about the procedure.   Inpatient Medications    Scheduled Meds: . carvedilol  1.5625 mg Oral BID WC  . dorzolamide  1 drop Right Eye BID  . famotidine  20 mg Oral Daily  . LORazepam  0.5 mg Oral QHS  . magnesium oxide  200 mg Oral Daily  . sodium chloride flush  3 mL Intravenous Q12H  . warfarin  2.5 mg Oral q1800  . Warfarin - Physician Dosing Inpatient   Does not apply q1800   Continuous Infusions: . sodium chloride     PRN Meds: sodium chloride, acetaminophen, ondansetron (ZOFRAN) IV, sodium chloride flush   Vital Signs    Vitals:   05/28/19 1208 05/28/19 1753 05/28/19 2302 05/29/19 0404  BP: (!) 90/58 134/79 108/63 113/70  Pulse: 78 (!) 116 (!) 102 61  Resp: 20  18 18   Temp: 98.4 F (36.9 C)  97.6 F (36.4 C) 97.7 F (36.5 C)  TempSrc: Oral   Oral  SpO2: 97%  98% 99%  Weight:    83.3 kg  Height:        Intake/Output Summary (Last 24 hours) at 05/29/2019 0713 Last data filed at 05/29/2019 0500 Gross per 24 hour  Intake 243 ml  Output 750 ml  Net -507 ml   Filed Weights   05/27/19 0524 05/28/19 0510 05/29/19 0404  Weight: 83.8 kg 83.5 kg 83.3 kg    Physical Exam    GEN- The patient is elderly appearing, alert and oriented x 3 today.   Head- normocephalic, atraumatic Eyes-  Sclera clear, conjunctiva pink Ears- hearing intact Oropharynx- clear Neck- supple Lungs- Clear to ausculation bilaterally, normal work of breathing Heart- Regular rate and rhythm, no murmurs, rubs or gallops GI- soft, NT, ND, + BS Extremities- no clubbing, cyanosis, or edema Skin- no rash or lesion Psych- euthymic mood, full affect Neuro- strength and sensation are intact  Labs    CBC Recent Labs    05/27/19 0004  WBC  7.8  HGB 10.0*  HCT 31.7*  MCV 97.2  PLT 497   Basic Metabolic Panel Recent Labs    05/27/19 0004 05/28/19 0529  NA 138 139  K 3.9 3.7  CL 105 104  CO2 22 24  GLUCOSE 111* 107*  BUN 21 28*  CREATININE 1.61* 1.69*  CALCIUM 10.3 10.2  MG  --  1.8   Liver Function Tests No results for input(s): AST, ALT, ALKPHOS, BILITOT, PROT, ALBUMIN in the last 72 hours. No results for input(s): LIPASE, AMYLASE in the last 72 hours. Cardiac Enzymes No results for input(s): CKTOTAL, CKMB, CKMBINDEX, TROPONINI in the last 72 hours. BNP Invalid input(s): POCBNP D-Dimer No results for input(s): DDIMER in the last 72 hours. Hemoglobin A1C No results for input(s): HGBA1C in the last 72 hours. Fasting Lipid Panel No results for input(s): CHOL, HDL, LDLCALC, TRIG, CHOLHDL, LDLDIRECT in the last 72 hours. Thyroid Function Tests No results for input(s): TSH, T4TOTAL, T3FREE, THYROIDAB in the last 72 hours.  Invalid input(s): FREET3  Telemetry    Afib vs AT in 110-120s, up to 130-140s at times (personally reviewed)  Radiology    Dg Chest Port 1 View  Result Date: 05/28/2019 CLINICAL DATA:  Cough EXAM: PORTABLE CHEST 1 VIEW COMPARISON:  Chest radiograph 05/25/2019 FINDINGS: Multi  lead pacer apparatus overlies the left hemithorax, leads are stable in position. Stable cardiac and mediastinal contours. Minimal heterogeneous opacities left lung base. No pleural effusion or pneumothorax. Thoracic spine degenerative changes. IMPRESSION: Minimal heterogeneous opacities left lung base may represent atelectasis or infection. Electronically Signed   By: Lovey Newcomer M.D.   On: 05/28/2019 11:34     Patient Profile     Molly [name] is a 85 y.o. female with a hx of DCM, chronic CHF (systolic), HTN, tachy-brady, sinus node dysfunction with PPM, paroxysmal Afib, ATach, a remote h/p frequent PVCs as well, who is being seen today for the evaluation of recurrent symptomatic AFib.   Assessment & Plan     1. Paroxysmal Afib  - Appears to have failed dofetilide. Previously on amiodarone with ? Lung injury and + tremors and failed sotalol due to fatigue/malaise  2. ATachycardia - She is very symptomatic with both CHA2DS2/VASc is 5. On coumadin.   3. S/p Bost-Sci Dual Chamber PPM for tachy-brady and SND - Normal function at last check 05/09/2019     For questions or updates, please contact [place] Please consult [email or web address] for contact info under Cardiology/STEMI.  Signed, Shirley Friar, PA-C  05/29/2019, 7:13 AM    EP Attending  Patient seen and examined. Agree with the findings as noted above. The patient has developed worsening Chf in the setting of uncontrolled atrial fib despite medical therapy. She has a resting hr of 120. Her EF is down. I have discussed the indications/risks/benefits/goals/expectations of AV node ablation with the patient and her husband and she wishes to proceed.  Mikle Bosworth.D.

## 2019-05-29 NOTE — Progress Notes (Signed)
Site Area: Right Femoral Vein Site Prior to Removal: Level 0 Pressure Applied For: 10 minutes Manual: YES Patient Status During Pull: Stable Post Pull Site: Level 0 Post Pull Instructions Given: YES Post Pull Pulses Present: DP +2 Dressing Applied: Sterile 4x4 gauze and tegaderm Bedrest begins @ 1941 Comments:  Removed by Verlin Fester

## 2019-05-29 NOTE — Plan of Care (Signed)
  Problem: Skin Integrity: Goal: Risk for impaired skin integrity will decrease Note: Right puncture for cath Lab

## 2019-05-29 NOTE — Care Management Important Message (Signed)
Important Message  Patient Details  Name: Molly [name] MRN: [id number] Date of Birth: [birth year]   Medicare Important Message Given:  Yes     [name] 05/29/2019, 12:16 PM

## 2019-05-29 NOTE — Progress Notes (Signed)
Patient is alert and oriented on room air. Consent signed. Going to [place] lab with CATH staff. Off Tele and CCMD notified.

## 2019-05-29 NOTE — Progress Notes (Addendum)
Pharmacy called due to 2 carvedilol's being ordered. The patient has been on carvedilol 3.125mg  1/2 tablet BID which she has been on during admission. Post procedure there was a duplicate carvedilol re-ordered from home med list at tablet dosage but sig was not adjusted for how she was actually taking this at home. BP running on softer side so per d/w pharmacy, will continue home dose of 1.5625mg  BID and have EP reassess in AM to determine if plan was to titrate or continue home dose. Molly Reagor PA-C

## 2019-05-30 ENCOUNTER — Encounter (HOSPITAL_COMMUNITY): Payer: Self-pay | Admitting: Internal Medicine

## 2019-05-30 DIAGNOSIS — I4811 Longstanding persistent atrial fibrillation: Secondary | ICD-10-CM

## 2019-05-30 LAB — BASIC METABOLIC PANEL
Anion gap: 8 (ref 5–15)
BUN: 28 mg/dL — ABNORMAL HIGH (ref 8–23)
CO2: 23 mmol/L (ref 22–32)
Calcium: 10 mg/dL (ref 8.9–10.3)
Chloride: 110 mmol/L (ref 98–111)
Creatinine, Ser: 1.32 mg/dL — ABNORMAL HIGH (ref 0.44–1.00)
GFR calc Af Amer: 43 mL/min — ABNORMAL LOW (ref >60)
GFR calc non Af Amer: 37 mL/min — ABNORMAL LOW (ref >60)
Glucose, Bld: 104 mg/dL — ABNORMAL HIGH (ref 70–99)
Potassium: 4.3 mmol/L (ref 3.5–5.1)
Sodium: 141 mmol/L (ref 135–145)

## 2019-05-30 LAB — PROTIME-INR
INR: 2.3 — ABNORMAL HIGH (ref 0.8–1.2)
Prothrombin Time: 25 seconds — ABNORMAL HIGH (ref 11.4–15.2)

## 2019-05-30 NOTE — Discharge Summary (Addendum)
ELECTROPHYSIOLOGY PROCEDURE DISCHARGE SUMMARY    Patient ID: Molly [name],  MRN: [id number], DOB/AGE: 83-Aug-1934 83 y.o.  Admit date: 05/25/2019 Discharge date: 05/30/2019  Primary Care Physician: [place], Betty G, MD  Primary Cardiologist: Dr. Radford Pax Electrophysiologist: Dr. [name]  Primary Discharge Diagnosis:  Symptomatic chronic Afib Symptomatic Atrial tachycardia  Secondary Discharge Diagnosis: DCM Chronic systolic CHF HTN Tachy-brady syndrome s/p Bost-Sci PPM SND   Procedures This Admission:  1.  Electrophysiology study and radiofrequency catheter ablation of AV node on 05/29/2019 by Dr [name].   Brief HPI: Molly [name] is a 85 y.o. female with a history of chronic, symptomatic atrial fibrillation.  They have failed medical therapy with doeftilide, amiodarone, and sotalol. Pt was admitted with recurrent symptoms and decision made to proceed with Risks, benefits, and alternatives to catheter ablation of atrial fibrillation were reviewed with the patient who wished to proceed.    Hospital Course:  The patient was admitted and underwent EPS/RFCA of atrial fibrillation with details as outlined above.  They were monitored on telemetry overnight which demonstrated V pacing in the 90s as programmed.  Groin was without complication on the day of discharge.  The patient was examined and considered to be stable for discharge.  Wound care and restrictions were reviewed with the patient.  The patient will be seen back by Shirley Friar, PA-C in 2 weeks and Dr. [name] in 3 months.   Home health PT ordered for patient per her request with deconditioning over past several months in setting of CHF and symptomatic Afib.  This patients CHA2DS2-VASc Score and unadjusted Ischemic Stroke Rate (% per year) is equal to 7.2 % stroke rate/year from a score of 5 Above score calculated as 1 point each if present [CHF, HTN, DM, Vascular=MI/PAD/Aortic Plaque, Age if 65-74, or Female]  Above score calculated as 2 points each if present [Age > 75, or Stroke/TIA/TE]   Physical Exam: Vitals:   05/29/19 1800 05/29/19 1900 05/29/19 2211 05/30/19 0609  BP: 106/67 110/61 (!) 103/57 118/61  Pulse:    88  Resp:    15  Temp:    98 F (36.7 C)  TempSrc:    Oral  SpO2: 98% 97%  96%  Weight:    83 kg  Height:        GEN- The patient is elderly appearing, alert and oriented x 3 today.   HEENT: normocephalic, atraumatic; sclera clear, conjunctiva pink; hearing intact; oropharynx clear; neck supple  Lungs- Clear to ausculation bilaterally, normal work of breathing.  No wheezes, rales, rhonchi Heart- Regular rate and rhythm, no murmurs, rubs or gallops  GI- soft, non-tender, non-distended, bowel sounds present  Extremities- no clubbing, cyanosis, or edema; DP/PT/radial pulses 2+ bilaterally, groin without hematoma/bruit MS- no significant deformity or atrophy Skin- warm and dry, no rash or lesion Psych- euthymic mood, full affect Neuro- strength and sensation are intact   Labs:   Lab Results  Component Value Date   WBC 7.8 05/27/2019   HGB 10.0 (L) 05/27/2019   HCT 31.7 (L) 05/27/2019   MCV 97.2 05/27/2019   PLT 230 05/27/2019    Recent Labs  Lab 05/30/19 0331  NA 141  K 4.3  CL 110  CO2 23  BUN 28*  CREATININE 1.32*  CALCIUM 10.0  GLUCOSE 104*     Discharge Medications:  Allergies as of 05/30/2019      Reactions   Codeine Nausea Only   Tramadol Nausea Only  Medication List    STOP taking these medications   dofetilide 250 MCG capsule Commonly known as: TIKOSYN   famotidine 20 MG tablet Commonly known as: PEPCID     TAKE these medications   acetaminophen 500 MG tablet Commonly known as: TYLENOL Take 1,000 mg by mouth 2 (two) times daily as needed for mild pain. For pain   carvedilol 3.125 MG tablet Commonly known as: COREG Take 1/2 tablet twice a day What changed:   how much to take  how to take this  when to take this   additional instructions   dorzolamide 2 % ophthalmic solution Commonly known as: TRUSOPT Place 1 drop into the right eye 2 (two) times daily.   LORazepam 0.5 MG tablet Commonly known as: ATIVAN Take 1 tablet (0.5 mg total) by mouth at bedtime.   Magnesium Oxide 500 MG Tabs Take 0.5 tablets (250 mg total) by mouth daily.   multivitamin with minerals Tabs tablet Take 1 tablet by mouth daily.   omeprazole 20 MG capsule Commonly known as: PRILOSEC Take 20 mg by mouth daily.   warfarin 5 MG tablet Commonly known as: COUMADIN Take as directed. If you are unsure how to take this medication, talk to your nurse or doctor. Original instructions: TAKE 1/2 TO 1 TABLET DAILY AS DIRECTED BY ANTICOAGULATION CLINIC What changed: See the new instructions.       Disposition:   Follow-up Information    Shirley Friar, PA-C Follow up on 06/14/2019.   Specialty: Physician Assistant Why: at 2 pm for post hospital follow up and pacemaker check Contact information: [place] [address] [place] [address] [place] at [place] Follow up on 05/31/2019.   Specialty: Family Medicine Why: at 345 pm for coumadin check Contact information: [place] [place] [phone]          Duration of Discharge Encounter: Greater than 30 minutes including physician time.  Jacalyn Lefevre, PA-C  05/30/2019 10:36 AM   EP Attending  Patient seen and examined. Agree with above. The patient has undergone AV node ablation. Her VR is controlled. She will be discharged home on her prior meds. Hopefully her EF will improve along with her CHF symptoms.   Mikle Bosworth.D.

## 2019-05-30 NOTE — Evaluation (Signed)
Physical Therapy Evaluation Patient Details Name: Molly [name] MRN: [id number] DOB: 1932-10-27 Today's Date: 05/30/2019   History of Present Illness  Pt is an 85 y/o female admitted secondary to worsening fatigue and dyspnea. Pt with CHF and a fib and is s/p AVN ablation. PMH includes s/p pacemaker, skin cancer, HTN, and a fib.   Clinical Impression  Pt admitted secondary to problem above with deficits below. Pt requiring min to min guard A for mobility using RW. Educated about use of RW at home to improve safety. Feel she would benefit from HHPT at d/c. Will continue to follow acutely to maximize functional mobility independence and safety.     Follow Up Recommendations Home health PT;Supervision for mobility/OOB    Equipment Recommendations  None recommended by PT    Recommendations for Other Services       Precautions / Restrictions Precautions Precautions: Fall Restrictions Weight Bearing Restrictions: No      Mobility  Bed Mobility Overal bed mobility: Needs Assistance Bed Mobility: Supine to Sit     Supine to sit: Supervision     General bed mobility comments: supervision for safety.   Transfers Overall transfer level: Needs assistance Equipment used: Rolling walker (2 wheeled) Transfers: Sit to/from Stand Sit to Stand: Min assist         General transfer comment: Min A for lift assist and steadying. Cues for safe hand placement.   Ambulation/Gait Ambulation/Gait assistance: Min guard Gait Distance (Feet): 100 Feet Assistive device: Rolling walker (2 wheeled) Gait Pattern/deviations: Step-through pattern;Decreased stride length Gait velocity: Decreased   General Gait Details: Slow, mildly unsteady gait. Required cues for sequencing using RW. Educated about using RW at home to increase safety.   Stairs            Wheelchair Mobility    Modified Rankin (Stroke Patients Only)       Balance Overall balance assessment: Needs  assistance Sitting-balance support: No upper extremity supported;Feet supported Sitting balance-Leahy Scale: Good     Standing balance support: Bilateral upper extremity supported;During functional activity Standing balance-Leahy Scale: Poor Standing balance comment: Reliant on BUE support                              Pertinent Vitals/Pain Pain Assessment: No/denies pain    Home Living Family/patient expects to be discharged to:: Private residence Living Arrangements: Spouse/significant other Available Help at Discharge: Family;Available 24 hours/day Type of Home: House Home Access: Stairs to enter Entrance Stairs-Rails: None Entrance Stairs-Number of Steps: 1 Home Layout: Two level;Able to live on main level with bedroom/bathroom Home Equipment: Gilford Rile - 2 wheels;Cane - single point      Prior Function Level of Independence: Independent with assistive device(s)         Comments: Used cane for ambulation      Hand Dominance        Extremity/Trunk Assessment   Upper Extremity Assessment Upper Extremity Assessment: Generalized weakness    Lower Extremity Assessment Lower Extremity Assessment: Generalized weakness    Cervical / Trunk Assessment Cervical / Trunk Assessment: Kyphotic  Communication   Communication: No difficulties  Cognition Arousal/Alertness: Awake/alert Behavior During Therapy: WFL for tasks assessed/performed Overall Cognitive Status: Within Functional Limits for tasks assessed  General Comments      Exercises     Assessment/Plan    PT Assessment Patient needs continued PT services  PT Problem List Decreased strength;Decreased balance;Decreased mobility;Decreased activity tolerance;Decreased knowledge of use of DME       PT Treatment Interventions DME instruction;Gait training;Functional mobility training;Therapeutic activities;Stair training;Therapeutic  exercise;Balance training;Patient/family education    PT Goals (Current goals can be found in the Care Plan section)  Acute Rehab PT Goals Patient Stated Goal: to go home PT Goal Formulation: With patient Time For Goal Achievement: 06/13/19 Potential to Achieve Goals: Good    Frequency Min 3X/week   Barriers to discharge        Co-evaluation               AM-PAC PT "6 Clicks" Mobility  Outcome Measure Help needed turning from your back to your side while in a flat bed without using bedrails?: None Help needed moving from lying on your back to sitting on the side of a flat bed without using bedrails?: None Help needed moving to and from a bed to a chair (including a wheelchair)?: A Little Help needed standing up from a chair using your arms (e.g., wheelchair or bedside chair)?: A Little Help needed to walk in hospital room?: A Little Help needed climbing 3-5 steps with a railing? : A Lot 6 Click Score: 19    End of Session Equipment Utilized During Treatment: Gait belt Activity Tolerance: Patient tolerated treatment well Patient left: in bed;with call bell/phone within reach;with bed alarm set(sitting EOB ) Nurse Communication: Mobility status PT Visit Diagnosis: Unsteadiness on feet (R26.81);Muscle weakness (generalized) (M62.81)    Time: 2703-5009 PT Time Calculation (min) (ACUTE ONLY): 14 min   Charges:   PT Evaluation $PT Eval Low Complexity: [place], PT, DPT  Acute Rehabilitation Services  Pager: [phone] Office: [phone]   Rudean Hitt 05/30/2019, 11:28 AM

## 2019-05-30 NOTE — TOC Transition Note (Signed)
Transition of Care [facility]) - CM/SW Discharge Note Marvetta Gibbons RN,BSN Transitions of Care Unit 3E - RN Case Manager [phone]   Patient Details  Name: Molly [name] MRN: [id number] Date of Birth: [birth year]  Transition of Care [facility]) CM/SW Contact:  Dawayne Patricia, RN Phone Number: 05/30/2019, 11:19 AM   Clinical Narrative:    Pt stable for transition home today, orders placed for HHPT, CM spoke with pt at bedside, list provided Per CMS guidelines from medicare.gov website with star ratings (copy placed in shadow chart) for choice- per pt she would like to use [facility] for [facility] needs (request for therapist Anda Kraft) verified home address, phone # and PCP in epic. Call placed to [place] with [facility] for [facility] referral- request made on pt's behalf for Anda Kraft- referral has been accepted.    Final next level of care: [place] Barriers to Discharge: No Barriers Identified   Patient Goals and CMS Choice Patient states their goals for this hospitalization and ongoing recovery are:: "to get around better" CMS Medicare.gov Compare Post Acute Care list provided to:: Patient Choice offered to / list presented to : Patient  Discharge Placement  Home with [facility]                     Discharge Plan and Services   Discharge Planning Services: CM Consult Post Acute Care Choice: Home Health          DME Arranged: N/A DME Agency: NA       HH Arranged: PT [place] Agency: [facility] [employer] Date [place]: 05/30/19 Time [place]: 1118 Representative spoke with at [place]: [place] ([place]) Interventions     Readmission Risk Interventions Readmission Risk Prevention Plan 05/30/2019  Post Dischage Appt Complete  Medication Screening Complete  Transportation Screening Complete  Some recent data might be hidden

## 2019-05-30 NOTE — Progress Notes (Deleted)
Electrophysiology Rounding Note  Patient Name: Molly [name] Date of Encounter: 05/30/2019  Primary Cardiologist: Dr. Radford Pax Electrophysiologist: Dr. [name]   Subjective   S/p AVN ablation 05/29/2019. Has [employer] Dual chamber PPM in place  Thinks she is feeling better this am, but has not been out of bed.   Inpatient Medications    Scheduled Meds: . carvedilol  1.5625 mg Oral BID WC  . dorzolamide  1 drop Right Eye BID  . famotidine  20 mg Oral Daily  . LORazepam  0.5 mg Oral QHS  . magnesium oxide  200 mg Oral Daily  . sodium chloride flush  3 mL Intravenous Q12H  . sodium chloride flush  3 mL Intravenous Q12H  . warfarin  2.5 mg Oral q1800  . Warfarin - Physician Dosing Inpatient   Does not apply q1800   Continuous Infusions: . sodium chloride    . sodium chloride     PRN Meds: sodium chloride, sodium chloride, acetaminophen, ondansetron (ZOFRAN) IV, sodium chloride flush, sodium chloride flush   Vital Signs    Vitals:   05/29/19 1800 05/29/19 1900 05/29/19 2211 05/30/19 0609  BP: 106/67 110/61 (!) 103/57 118/61  Pulse:    88  Resp:    15  Temp:    98 F (36.7 C)  TempSrc:    Oral  SpO2: 98% 97%  96%  Weight:    83 kg  Height:        Intake/Output Summary (Last 24 hours) at 05/30/2019 0702 Last data filed at 05/29/2019 2310 Gross per 24 hour  Intake 240 ml  Output 200 ml  Net 40 ml   Filed Weights   05/28/19 0510 05/29/19 0404 05/30/19 0609  Weight: 83.5 kg 83.3 kg 83 kg    Physical Exam    GEN- The patient is well appearing, alert and oriented x 3 today.   Head- normocephalic, atraumatic Eyes-  Sclera clear, conjunctiva pink Ears- hearing intact Oropharynx- clear Neck- supple Lungs- Clear to ausculation bilaterally, normal work of breathing Heart- Regular rate and rhythm, no murmurs, rubs or gallops GI- soft, NT, ND, + BS Extremities- no clubbing, cyanosis, or edema. R groin wound stable, no bruit. Dressing C/D/I. Skin- no rash or lesion  Psych- euthymic mood, full affect Neuro- strength and sensation are intact  Labs    CBC No results for input(s): WBC, NEUTROABS, HGB, HCT, MCV, PLT in the last 72 hours. Basic Metabolic Panel Recent Labs    05/28/19 0529 05/29/19 0652 05/30/19 0331  NA 139 139 141  K 3.7 4.3 4.3  CL 104 108 110  CO2 24 24 23   GLUCOSE 107* 103* 104*  BUN 28* 26* 28*  CREATININE 1.69* 1.50* 1.32*  CALCIUM 10.2 10.3 10.0  MG 1.8  --   --    Liver Function Tests No results for input(s): AST, ALT, ALKPHOS, BILITOT, PROT, ALBUMIN in the last 72 hours. No results for input(s): LIPASE, AMYLASE in the last 72 hours. Cardiac Enzymes No results for input(s): CKTOTAL, CKMB, CKMBINDEX, TROPONINI in the last 72 hours. BNP Invalid input(s): POCBNP D-Dimer No results for input(s): DDIMER in the last 72 hours. Hemoglobin A1C No results for input(s): HGBA1C in the last 72 hours. Fasting Lipid Panel No results for input(s): CHOL, HDL, LDLCALC, TRIG, CHOLHDL, LDLDIRECT in the last 72 hours. Thyroid Function Tests No results for input(s): TSH, T4TOTAL, T3FREE, THYROIDAB in the last 72 hours.  Invalid input(s): FREET3  Telemetry   V-paced at 90-92 bpm  since her procedure (personally reviewed)  Radiology    Dg Chest Port 1 View  Result Date: 05/28/2019 CLINICAL DATA:  Cough EXAM: PORTABLE CHEST 1 VIEW COMPARISON:  Chest radiograph 05/25/2019 FINDINGS: Multi lead pacer apparatus overlies the left hemithorax, leads are stable in position. Stable cardiac and mediastinal contours. Minimal heterogeneous opacities left lung base. No pleural effusion or pneumothorax. Thoracic spine degenerative changes. IMPRESSION: Minimal heterogeneous opacities left lung base may represent atelectasis or infection. Electronically Signed   By: Lovey Newcomer M.D.   On: 05/28/2019 11:34     Patient Profile     Molly Carusone Bridgesis a 85 y.o.femalewith a hx of DCM, chronic CHF (systolic), HTN, tachy-brady, sinus node  dysfunction with PPM, paroxysmal Afib, ATach,a remote h/p frequent PVCs as well,who is being seen today for the evaluation ofrecurrent symptomatic AFib.   Assessment & Plan    1. Paroxsymal afib  Now s/p AVN ablation 05/29/2019. V paced in 90s currently. Pt failed dofetilide, amiodarone, and sotalol. Pt on coumadin with CHA2DS/VASc of 5.  2. ATachycardia - As above.   3. Bost Sci- Dual chamber PPM for tachy-brady and SND - Normal function s/p AVN ablation   For questions or updates, please contact [facility] Please consult [email or web address] for contact info under Cardiology/STEMI.  Signed, Shirley Friar, PA-C  05/30/2019, 7:02 AM

## 2019-05-31 ENCOUNTER — Ambulatory Visit: Payer: Medicare Other

## 2019-05-31 ENCOUNTER — Telehealth: Payer: Self-pay | Admitting: Family Medicine

## 2019-05-31 NOTE — Telephone Encounter (Signed)
See note

## 2019-05-31 NOTE — Telephone Encounter (Signed)
Copied from [place] #[id number]. Topic: Quick Communication - Home Health Verbal Orders >> May 31, 2019 10:11 AM Nils Flack wrote: Caller/Agency: [facility]  Callback Number:  Requesting OT/PT/Skilled Nursing/Social Work/Speech Therapy: nursing  Frequency: will start on the 10th  Pt is requesting to delay the start of care until the 10th

## 2019-06-01 NOTE — Telephone Encounter (Signed)
Spoke with Darlina Guys and gave verbal orders as requested.

## 2019-06-05 ENCOUNTER — Telehealth: Payer: Self-pay | Admitting: Internal Medicine

## 2019-06-05 ENCOUNTER — Telehealth: Payer: Self-pay

## 2019-06-05 DIAGNOSIS — Z7901 Long term (current) use of anticoagulants: Secondary | ICD-10-CM | POA: Diagnosis not present

## 2019-06-05 DIAGNOSIS — I420 Dilated cardiomyopathy: Secondary | ICD-10-CM | POA: Diagnosis not present

## 2019-06-05 DIAGNOSIS — I495 Sick sinus syndrome: Secondary | ICD-10-CM | POA: Diagnosis not present

## 2019-06-05 DIAGNOSIS — N183 Chronic kidney disease, stage 3 (moderate): Secondary | ICD-10-CM | POA: Diagnosis not present

## 2019-06-05 DIAGNOSIS — Z950 Presence of cardiac pacemaker: Secondary | ICD-10-CM | POA: Diagnosis not present

## 2019-06-05 DIAGNOSIS — I5043 Acute on chronic combined systolic (congestive) and diastolic (congestive) heart failure: Secondary | ICD-10-CM | POA: Diagnosis not present

## 2019-06-05 DIAGNOSIS — I130 Hypertensive heart and chronic kidney disease with heart failure and stage 1 through stage 4 chronic kidney disease, or unspecified chronic kidney disease: Secondary | ICD-10-CM | POA: Diagnosis not present

## 2019-06-05 DIAGNOSIS — I4819 Other persistent atrial fibrillation: Secondary | ICD-10-CM | POA: Diagnosis not present

## 2019-06-05 DIAGNOSIS — Z85828 Personal history of other malignant neoplasm of skin: Secondary | ICD-10-CM | POA: Diagnosis not present

## 2019-06-05 DIAGNOSIS — M545 Low back pain: Secondary | ICD-10-CM | POA: Diagnosis not present

## 2019-06-05 DIAGNOSIS — F419 Anxiety disorder, unspecified: Secondary | ICD-10-CM | POA: Diagnosis not present

## 2019-06-05 DIAGNOSIS — I471 Supraventricular tachycardia: Secondary | ICD-10-CM | POA: Diagnosis not present

## 2019-06-05 DIAGNOSIS — G8929 Other chronic pain: Secondary | ICD-10-CM | POA: Diagnosis not present

## 2019-06-05 DIAGNOSIS — I080 Rheumatic disorders of both mitral and aortic valves: Secondary | ICD-10-CM | POA: Diagnosis not present

## 2019-06-05 DIAGNOSIS — Z89421 Acquired absence of other right toe(s): Secondary | ICD-10-CM | POA: Diagnosis not present

## 2019-06-05 DIAGNOSIS — K219 Gastro-esophageal reflux disease without esophagitis: Secondary | ICD-10-CM | POA: Diagnosis not present

## 2019-06-05 NOTE — Telephone Encounter (Signed)
lmtcb with Anda Kraft from [facility] advising her to contact PCP for PT orders.

## 2019-06-05 NOTE — Telephone Encounter (Signed)
lmom for overdue inr 

## 2019-06-05 NOTE — Telephone Encounter (Signed)
New message:   Anda Kraft from [place] calling toget orders for this patient. They need physical threapy for twice a week for 6 weeks then once a week for 3 weeks.

## 2019-06-06 ENCOUNTER — Other Ambulatory Visit: Payer: Self-pay | Admitting: Cardiology

## 2019-06-06 DIAGNOSIS — I4819 Other persistent atrial fibrillation: Secondary | ICD-10-CM

## 2019-06-06 MED ORDER — CARVEDILOL 3.125 MG PO TABS: ORAL_TABLET | ORAL | 2 refills | Status: DC

## 2019-06-07 ENCOUNTER — Telehealth: Payer: Self-pay | Admitting: Family Medicine

## 2019-06-07 NOTE — Telephone Encounter (Signed)
It is okay to give verbal authorization to requested services. Thanks, BJ 

## 2019-06-07 NOTE — Telephone Encounter (Signed)
Left message for Molly [name] to return call to clinic for verbal orders.

## 2019-06-07 NOTE — Telephone Encounter (Signed)
See Note Below

## 2019-06-07 NOTE — Telephone Encounter (Signed)
Copied from [place] [id number]. Topic: Quick Communication - See Telephone Encounter >> Jun 07, 2019  8:41 AM Antonieta Iba C wrote: CRM for notification. See Telephone encounter for: 06/07/19.   Gennaro Africa with [facility] called in to request VO for PT for 2 week 6 and 1 week 3    CB: [phone]

## 2019-06-08 DIAGNOSIS — I130 Hypertensive heart and chronic kidney disease with heart failure and stage 1 through stage 4 chronic kidney disease, or unspecified chronic kidney disease: Secondary | ICD-10-CM | POA: Diagnosis not present

## 2019-06-08 DIAGNOSIS — G8929 Other chronic pain: Secondary | ICD-10-CM | POA: Diagnosis not present

## 2019-06-08 DIAGNOSIS — M545 Low back pain: Secondary | ICD-10-CM | POA: Diagnosis not present

## 2019-06-08 DIAGNOSIS — I5043 Acute on chronic combined systolic (congestive) and diastolic (congestive) heart failure: Secondary | ICD-10-CM | POA: Diagnosis not present

## 2019-06-08 DIAGNOSIS — N183 Chronic kidney disease, stage 3 (moderate): Secondary | ICD-10-CM | POA: Diagnosis not present

## 2019-06-08 DIAGNOSIS — I4819 Other persistent atrial fibrillation: Secondary | ICD-10-CM | POA: Diagnosis not present

## 2019-06-08 NOTE — Telephone Encounter (Signed)
Verbal orders given to [facility].

## 2019-06-12 DIAGNOSIS — I5043 Acute on chronic combined systolic (congestive) and diastolic (congestive) heart failure: Secondary | ICD-10-CM | POA: Diagnosis not present

## 2019-06-12 DIAGNOSIS — I4819 Other persistent atrial fibrillation: Secondary | ICD-10-CM | POA: Diagnosis not present

## 2019-06-12 DIAGNOSIS — I130 Hypertensive heart and chronic kidney disease with heart failure and stage 1 through stage 4 chronic kidney disease, or unspecified chronic kidney disease: Secondary | ICD-10-CM | POA: Diagnosis not present

## 2019-06-12 DIAGNOSIS — M545 Low back pain: Secondary | ICD-10-CM | POA: Diagnosis not present

## 2019-06-12 DIAGNOSIS — G8929 Other chronic pain: Secondary | ICD-10-CM | POA: Diagnosis not present

## 2019-06-12 DIAGNOSIS — N183 Chronic kidney disease, stage 3 (moderate): Secondary | ICD-10-CM | POA: Diagnosis not present

## 2019-06-14 ENCOUNTER — Encounter: Payer: Medicare Other | Admitting: Student

## 2019-06-14 ENCOUNTER — Other Ambulatory Visit: Payer: Self-pay

## 2019-06-14 ENCOUNTER — Ambulatory Visit (INDEPENDENT_AMBULATORY_CARE_PROVIDER_SITE_OTHER): Payer: Medicare Other | Admitting: Student

## 2019-06-14 ENCOUNTER — Encounter (INDEPENDENT_AMBULATORY_CARE_PROVIDER_SITE_OTHER): Payer: Self-pay

## 2019-06-14 VITALS — BP 142/82 | HR 90 | Ht 65.0 in | Wt 189.0 lb

## 2019-06-14 DIAGNOSIS — Z950 Presence of cardiac pacemaker: Secondary | ICD-10-CM

## 2019-06-14 DIAGNOSIS — I4819 Other persistent atrial fibrillation: Secondary | ICD-10-CM | POA: Diagnosis not present

## 2019-06-14 DIAGNOSIS — I5042 Chronic combined systolic (congestive) and diastolic (congestive) heart failure: Secondary | ICD-10-CM

## 2019-06-14 LAB — CUP PACEART INCLINIC DEVICE CHECK
Date Time Interrogation Session: 20200819040000
Implantable Lead Implant Date: 20140508
Implantable Lead Implant Date: 20140508
Implantable Lead Location: 753859
Implantable Lead Location: 753860
Implantable Lead Model: 4135
Implantable Lead Model: 4136
Implantable Lead Serial Number: 29333020
Implantable Lead Serial Number: 29379474
Implantable Pulse Generator Implant Date: 20140508
Lead Channel Impedance Value: 646 Ohm
Lead Channel Impedance Value: 660 Ohm
Lead Channel Pacing Threshold Amplitude: 0.6 V
Lead Channel Pacing Threshold Amplitude: 1.1 V
Lead Channel Pacing Threshold Pulse Width: 0.4 ms
Lead Channel Pacing Threshold Pulse Width: 0.4 ms
Lead Channel Setting Pacing Amplitude: 2.4 V
Lead Channel Setting Pacing Pulse Width: 0.4 ms
Lead Channel Setting Sensing Sensitivity: 2.5 mV
Pulse Gen Serial Number: 112392

## 2019-06-14 NOTE — Progress Notes (Signed)
Electrophysiology Office Note Date: 06/14/2019  ID:  Molly [name], Molly [name] [birth year], MRN [id number]  PCP: [place], Betty G, MD Primary Cardiologist: Fransico Him, MD Electrophysiologist: Dr. [name]  CC: Pacemaker follow-up  Molly [name] is a 85 y.o. female seen today for Dr. [name]. She presents today for routine electrophysiology followup following AVN ablation 05/29/2019. She has been recovering well. She feels much better than prior to her admission for poorly controlled afib.  She denies chest pain, palpitations, dyspnea, PND, orthopnea, nausea, vomiting, dizziness, syncope, edema, weight gain, or early satiety.  Device History: Engineer, agricultural PPM implanted 02/2013 for Tachybrady syndrome.  Past Medical History:  Diagnosis Date  . Aortic insufficiency    mild by echo 2017  . Cancer ([place])    Skin cancer- basal.  1 mylenoma  . CHF (congestive heart failure) ([place])   . Complication of anesthesia   . DCM (dilated cardiomyopathy) ([place])    EF 40-45% by echo 2017 - nuclear stress test with no ischemia  . GERD (gastroesophageal reflux disease)   . H/O benign essential tremor    on amiodarone resolved off amio  . H/O hyperkalemia    Resolved off ACE inhibitors  . High cholesterol   . History of blood transfusion   . Hyperparathyroidism ([place])   . Hypertension   . Mitral regurgitation    mild to moderate by echo 2017  . Persistent atrial fibrillation   . PONV (postoperative nausea and vomiting)   . PVC's (premature ventricular contractions)   . Renal insufficiency    MILD  . Shortness of breath    with exertion  . Tachycardia-bradycardia syndrome [facility])    s/p PPM 02/2013   Past Surgical History:  Procedure Laterality Date  . AMPUTATION Right 11/27/2014   Procedure: RIGHT 2ND AND 3RD TOE AMPUTATION;  Surgeon: Wylene Simmer, MD;  Location: [facility];  Service: Orthopedics;  Laterality: Right;  . ATRIAL FIBRILLATION ABLATION N/A 05/29/2019   Procedure: AVN  ABLATION;  Surgeon: Evans Lance, MD;  Location: [facility];  Service: Cardiovascular;  Laterality: N/A;  . BUNIONECTOMY     Right  . CARDIAC CATHETERIZATION  09/25/2000   EF of 50% -- with normal left ventricular size and function  . CARDIOVERSION N/A 12/28/2017   Procedure: CARDIOVERSION;  Surgeon: Dorothy Spark, MD;  Location: [facility] ENDOSCOPY;  Service: Cardiovascular;  Laterality: N/A;  . CARDIOVERSION N/A 02/25/2018   Procedure: CARDIOVERSION;  Surgeon: Skeet Latch, MD;  Location: [facility];  Service: Cardiovascular;  Laterality: N/A;  . CESAREAN SECTION    . CESAREAN SECTION    . EYE SURGERY Bilateral    Cataract  . FOOT SURGERY    . INSERT / REPLACE / REMOVE PACEMAKER  02/2013  . PACEMAKER INSERTION  02/2013  . PERMANENT PACEMAKER INSERTION N/A 03/02/2013   Procedure: PERMANENT PACEMAKER INSERTION;  Surgeon: Evans Lance, MD;  Location: [facility] CATH LAB;  Service: Cardiovascular;  Laterality: N/A;  . SHOULDER ARTHROSCOPY W/ ROTATOR CUFF REPAIR    . SHOULDER SURGERY Right    roto cuff  . TOE AMPUTATION Left    2nd and 3rd, bunion under toes.  . TONSILLECTOMY    . VARICOSE VEIN SURGERY      Current Outpatient Medications  Medication Sig Dispense Refill  . acetaminophen (TYLENOL) 500 MG tablet Take 1,000 mg by mouth 2 (two) times daily as needed for mild pain. For pain     . carvedilol (COREG) 3.125 MG tablet  Take 1/2 tablet twice a day 90 tablet 2  . dorzolamide (TRUSOPT) 2 % ophthalmic solution Place 1 drop into the right eye 2 (two) times daily.    Marland Kitchen LORazepam (ATIVAN) 0.5 MG tablet Take 1 tablet (0.5 mg total) by mouth at bedtime. 30 tablet 2  . Magnesium Oxide 500 MG TABS Take 0.5 tablets (250 mg total) by mouth daily. 15 tablet 6  . Multiple Vitamin (MULTIVITAMIN WITH MINERALS) TABS Take 1 tablet by mouth daily.    Marland Kitchen omeprazole (PRILOSEC) 20 MG capsule Take 20 mg by mouth daily.    Marland Kitchen warfarin (COUMADIN) 5 MG tablet TAKE 1/2 TO 1 TABLET DAILY AS DIRECTED BY  ANTICOAGULATION CLINIC (Patient taking differently: Take 2.5-5 mg by mouth See admin instructions. Take 2.5mg  daily everyday except on mondays take 5mg  daily) 90 tablet 1   No current facility-administered medications for this visit.     Allergies:   Codeine and Tramadol   Social History: Social History   Socioeconomic History  . Marital status: Married    Spouse name: Not on file  . Number of children: 3  . Years of education: Not on file  . Highest education level: Not on file  Occupational History  . Not on file  Social Needs  . Financial resource strain: Not on file  . Food insecurity    Worry: Not on file    Inability: Not on file  . Transportation needs    Medical: Not on file    Non-medical: Not on file  Tobacco Use  . Smoking status: Never Smoker  . Smokeless tobacco: Never Used  Substance and Sexual Activity  . Alcohol use: No  . Drug use: No  . Sexual activity: Not Currently  Lifestyle  . Physical activity    Days per week: Not on file    Minutes per session: Not on file  . Stress: Not on file  Relationships  . Social Herbalist on phone: Not on file    Gets together: Not on file    Attends religious service: Not on file    Active member of club or organization: Not on file    Attends meetings of clubs or organizations: Not on file    Relationship status: Not on file  . Intimate partner violence    Fear of current or ex partner: Not on file    Emotionally abused: Not on file    Physically abused: Not on file    Forced sexual activity: Not on file  Other Topics Concern  . Not on file  Social History Narrative  . Not on file    Family History: Family History  Problem Relation Age of Onset  . Hypertension Mother   . Hypertension Father      Review of Systems: All other systems reviewed and are otherwise negative except as noted above.  Physical Exam: Vitals:   06/14/19 1401  BP: (!) 142/82  Pulse: 90  Weight: 189 lb (85.7 kg)   Height: 5\' 5"  (1.651 m)     GEN- The patient is well appearing, alert and oriented x 3 today.   HEENT: normocephalic, atraumatic; sclera clear, conjunctiva pink; hearing intact; oropharynx clear; neck supple  Lungs- Clear to ausculation bilaterally, normal work of breathing.  No wheezes, rales, rhonchi Heart- Regular rate and rhythm, no murmurs, rubs or gallops  GI- soft, non-tender, non-distended, bowel sounds present  Extremities- no clubbing, cyanosis, or edema  MS- no significant deformity or atrophy  Skin- warm and dry, no rash or lesion; PPM pocket well healed Psych- euthymic mood, full affect Neuro- strength and sensation are intact  PPM Interrogation- reviewed in detail today,  See PACEART report  EKG:  EKG is not ordered today.  Recent Labs: 01/24/2019: NT-Pro BNP 443 04/13/2019: TSH 2.804 05/25/2019: B Natriuretic Peptide 543.7 05/27/2019: Hemoglobin 10.0; Platelets 230 05/28/2019: Magnesium 1.8 05/30/2019: BUN 28; Creatinine, Ser 1.32; Potassium 4.3; Sodium 141   Wt Readings from Last 3 Encounters:  06/14/19 189 lb (85.7 kg)  05/30/19 183 lb (83 kg)  05/09/19 192 lb (87.1 kg)     Other studies Reviewed: Additional studies/ records that were reviewed today include: Recent admission notes, procedure notes.   Assessment and Plan:  1.  Tachybradycardia syndrome s/p [employer] PPM  Normal PPM function, Now in VVI with AVN ablation. See Pace Art report No changes today  2. Paroxsymal Afib Now s/p AVN ablation Continue coumadin  Current medicines are reviewed at length with the patient today.   The patient does not have concerns regarding her medicines.  The following changes were made today:  none  Labs/ tests ordered today include:  Orders Placed This Encounter  Procedures  . CUP PACEART INCLINIC DEVICE CHECK     Disposition:   Follow up with device clinic in 2 weeks to turn LRL to 60. Keep appt with Dr. [name] as scheduled in November.   Jacalyn Lefevre, PA-C  06/14/2019 6:35 PM  [place] [address] [phone] (office) [phone] (fax)

## 2019-06-14 NOTE — Patient Instructions (Signed)
Medication Instructions:  Your physician recommends that you continue on your current medications as directed. Please refer to the Current Medication list given to you today.   If you need a refill on your cardiac medications before your next appointment, please call your pharmacy.   Lab work: NONE ORDERED  TODAY   If you have labs (blood work) drawn today and your tests are completely normal, you will receive your results only by: . MyChart Message (if you have MyChart) OR . A paper copy in the mail If you have any lab test that is abnormal or we need to change your treatment, we will call you to review the results.  Testing/Procedures: NONE ORDERED  TODAY  Follow-Up:   AS SCHEDULED   Any Other Special Instructions Will Be Listed Below (If Applicable).     

## 2019-06-15 ENCOUNTER — Telehealth: Payer: Self-pay | Admitting: Student

## 2019-06-15 ENCOUNTER — Ambulatory Visit (INDEPENDENT_AMBULATORY_CARE_PROVIDER_SITE_OTHER): Payer: Medicare Other | Admitting: Student

## 2019-06-15 DIAGNOSIS — I5043 Acute on chronic combined systolic (congestive) and diastolic (congestive) heart failure: Secondary | ICD-10-CM | POA: Diagnosis not present

## 2019-06-15 DIAGNOSIS — M545 Low back pain: Secondary | ICD-10-CM | POA: Diagnosis not present

## 2019-06-15 DIAGNOSIS — Z950 Presence of cardiac pacemaker: Secondary | ICD-10-CM

## 2019-06-15 DIAGNOSIS — I4819 Other persistent atrial fibrillation: Secondary | ICD-10-CM | POA: Diagnosis not present

## 2019-06-15 DIAGNOSIS — I130 Hypertensive heart and chronic kidney disease with heart failure and stage 1 through stage 4 chronic kidney disease, or unspecified chronic kidney disease: Secondary | ICD-10-CM | POA: Diagnosis not present

## 2019-06-15 DIAGNOSIS — G8929 Other chronic pain: Secondary | ICD-10-CM | POA: Diagnosis not present

## 2019-06-15 DIAGNOSIS — N183 Chronic kidney disease, stage 3 (moderate): Secondary | ICD-10-CM | POA: Diagnosis not present

## 2019-06-15 LAB — CUP PACEART INCLINIC DEVICE CHECK
Date Time Interrogation Session: 20200820040000
Implantable Lead Implant Date: 20140508
Implantable Lead Implant Date: 20140508
Implantable Lead Location: 753859
Implantable Lead Location: 753860
Implantable Lead Model: 4135
Implantable Lead Model: 4136
Implantable Lead Serial Number: 29333020
Implantable Lead Serial Number: 29379474
Implantable Pulse Generator Implant Date: 20140508
Lead Channel Setting Pacing Amplitude: 2.4 V
Lead Channel Setting Pacing Pulse Width: 0.4 ms
Lead Channel Setting Sensing Sensitivity: 2.5 mV
Pulse Gen Serial Number: 112392

## 2019-06-15 NOTE — Telephone Encounter (Signed)
Pt feels worse since LRL turned from 90 -> 80. She has less energy and ran out of energy while doing physical therapy.  We will bring her in and reprogram and walk her. Will also discuss with Bost-Sci to see what changes he recommends.    Molly [name] [address]" [place], PA-C 06/15/2019 2:30 PM

## 2019-06-15 NOTE — Telephone Encounter (Signed)
°  New Message:   Patient called and said she just doesn't feel right. She had her pacemaker adjusted yesterday by Joesph July. She just feels fatigued She had a visit from her Physical Therapist, and was unable to do many of the exercises during therapy, and even walking seems draining. She felt fine prior to having her pacemaker adjusted, but has just started feeling poorly when she woke up this morning. She wants to know if maybe her device would need adjusted again.   Please advise

## 2019-06-15 NOTE — Progress Notes (Signed)
Pt with complaints of fatigue since rate turned down to LRL of 80. Discussed with Bost-Sci rep and device RN.  Changed to VVIR with Accelerometer @ 8 and Minute Ventilation @ 4. Pt walked around clinic with no immediate improvement of symptoms, but will continue to follow at 2 week follow up.

## 2019-06-20 ENCOUNTER — Telehealth: Payer: Self-pay | Admitting: Internal Medicine

## 2019-06-20 NOTE — Telephone Encounter (Signed)
No abnormalities on remote transmission.  Rates mostly in 80s with occasional escursions to 90s-100s. She has MORE HR variability than prior to her adjustment.   With orthopnea, this is more likely CHF.  Pt to keep follow up for next week.    Molly [name] [address] [place], [place] 06/20/2019 3:33 PM

## 2019-06-20 NOTE — Telephone Encounter (Signed)
The patient is calling because she came in last Wednesday and Thursday for Pacemaker adjustments with Jonni Sanger.  Since then, she has been feeling "lousy," tired, dizzy and SOB.  She said that she felt better prior to adjustment.  Please advise, thank you

## 2019-06-20 NOTE — Telephone Encounter (Signed)
Pt states since LRL turned down to 80 (from 90 post AVN ablation). She has not felt well.  She was brought back in the next day and rate response was turned on with Minute ventilation AND accelerometer.     She reports DOE and orthopnea. She is not sure if she has weight gain. She has lasix 20 mg as needed to take at home; Instructed her to take for the next 3 days.     She will send a remote transmission and we will review her rates and have Bost. Sci advise if any further improvement can be made at this time.  Pt encouraged to keep appointment with Renee next week, 9/3, and to call if lasix does not improve her symptoms.    [name] [id number] [place], PA-C 06/20/2019 12:06 PM

## 2019-06-20 NOTE — Telephone Encounter (Signed)
LMOM to CB. Sensor with Minute Ventilation and Accelerometer turned on last week. Unlikely to be able to make any more changes that will make a difference at this time.    [name] [address]" [place], PA-C 06/20/2019 11:56 AM

## 2019-06-20 NOTE — Telephone Encounter (Signed)
Pt c/o Shortness Of Breath: STAT if SOB developed within the last 24 hours or pt is noticeably SOB on the phone  1. Are you currently SOB (can you hear that pt is SOB on the phone)? Yes, I can hear a little  2. How long have you been experiencing SOB?  Since last Wednesday- came in Wednesday and Thursday last week   3. Are you SOB when sitting or when up moving around?  Both  4. Are you currently experiencing any other symptoms? Real tired, some dizziness

## 2019-06-22 NOTE — Telephone Encounter (Signed)
  Patient is calling back because she is still having issues with coughing, some SOB and fatigue. She called on 06/20/19 in regards to this in previous note included. She did take the fluids pills but it has not helped and she is getting worse. She states the coughing is keeping her awake.

## 2019-06-22 NOTE — Telephone Encounter (Signed)
I spoke to the patient who continues to experience SOB and coughing.  She spoke to [place], [place] and he has suggested CHF.  I scheduled a Virtual visit with Dayna on 8/31 @ 2 pm.    TELEPHONE CALL NOTE  This patient has been deemed a candidate for follow-up tele-health visit to limit community exposure during the Covid-19 pandemic. I spoke with the patient via phone to discuss instructions.  A Virtual Office Visit appointment type has been scheduled for 8/31 with Melina Copa, PA, with "VIDEO" or "TELEPHONE" in the appointment notes - patient prefers Phone type.   Frederik Schmidt, RN 06/22/2019 9:57 AM

## 2019-06-24 ENCOUNTER — Other Ambulatory Visit: Payer: Self-pay | Admitting: Physician Assistant

## 2019-06-24 DIAGNOSIS — I4819 Other persistent atrial fibrillation: Secondary | ICD-10-CM

## 2019-06-25 ENCOUNTER — Encounter: Payer: Self-pay | Admitting: Physician Assistant

## 2019-06-25 NOTE — Progress Notes (Signed)
Virtual Visit via Telephone Note   This visit type was conducted due to national recommendations for restrictions regarding the COVID-19 Pandemic (e.g. social distancing) in an effort to limit this patient's exposure and mitigate transmission in our community.  Due to her co-morbid illnesses, this patient is at least at moderate risk for complications without adequate follow up.  This format is felt to be most appropriate for this patient at this time.  The patient did not have access to video technology/had technical difficulties with video requiring transitioning to audio format only (telephone).  All issues noted in this document were discussed and addressed.  No physical exam could be performed with this format.  Please refer to the patient's chart for her  consent to telehealth for [facility].   Date:  06/26/2019   ID:  Molly [name], DOB 23-May-1933, MRN [id number]  Patient Location: Home Provider Location: Home  PCP:  Martin[place], Betty G, MD  Cardiologist:  Fransico Him, MD  Electrophysiologist:  Constance Haw, MD   Evaluation Performed:  Follow-Up Visit  Chief Complaint:  Fatigue, cough  History of Present Illness:    Molly [name] is a 85 y.o. female with chronic combined systolic/diastolic CHF (presumed NICM),aortic insufficiency, mitral regurgitation, hyperlipidemia, hypertension, persistent atrial fibrillation, tachy-brady syndrome s/p PPM implant 2014, CKD stage III by labs, hyperparathyroidism (mananged conservatively), PVCs who presents for virtual post-hospital f/u. She has long history of atrial fib going back many years and required PPM as above. In 2017 her echo showed reduction in EF to 40-45% with mild AI and mild-moderate MR, so nuclear stress test was pursued which showed no ischemia. Most recent echocardiogram in 05/26/19 showed further decline in EF to 30-35%, normal RVSP, moderate LAE, mild RAE, moderate mitral regurgitation, mild aortic  regurgitation.Her afib has been difficult to control and she has been followed by EP and the afib clinic. She failed amiodarone and sotalol in the past. She is not felt to be a good candidate for afib ablation given her severely dilated LA. She was on dofetilide but had symptomatic breakthrough atrial tachycardia and atrial fibrillation. She was then admitted 05/25/19 with worsening fatigue, dyspnea, and weight gain with mildly elevated BNP and uncontrolled atrial arrhythmias. She was treated with IV Lasix and underwent AVN ablation 05/29/19. Her Lasix was stopped due to AKI. She has history of hyperkalemia with both ACEI, ARB, and spironolactone and blood pressure has otherwise been prohibitive of aggressive med titration. She was seen by Oda Kilts in f/u 06/14/19 and still reported fatigue so PPM settings were adjusted with planned EP f/u. Last labs 05/2019 showed K 4.3, Cr 1.32, Hgb 10.0, Plt 230, 03/2019 TSH wnl.  She is seen virtually for follow-up and sounds somewhat discouraged that despite cardiac measures undertaken above, she has not made much progress in feeling better. She says she felt terrible the first few days after hospitalization, then the following week maybe a little better, and now is back to where she felt pre-ablation. She describes generalized fatigue ("I'm wiped out no matter what I do",) dyspnea on exertion, and a persistent dry cough that has been present for at least 2 months' time. She denies any hemoptysis. She is not on ACEI/ARB as above. The cough did not improve with diuresis. Weight has remained relatively stable. She takes occasional PRN Lasix '20mg'$  for mild edema but that has resolved. She denies any PND but does report some orthopnea although does not have to add any pillows for this. She  denies any fevers or chills.   She also reports a poor appetite in general.   Past Medical History:  Diagnosis Date   Aortic insufficiency    Atrial tachycardia (HCC)    Cancer (HCC)     Skin cancer- basal.  1 mylenoma   Chronic combined systolic and diastolic CHF (congestive heart failure) (HCC)    CKD (chronic kidney disease), stage III (HCC)    Complication of anesthesia    DCM (dilated cardiomyopathy) ([place])    EF 40-45% by echo 2017 - nuclear stress test with no ischemia   GERD (gastroesophageal reflux disease)    H/O benign essential tremor    on amiodarone resolved off amio   H/O cardiac radiofrequency ablation    a. AV node ablation in 05/2019.   H/O hyperkalemia    Resolved off ACE inhibitors   High cholesterol    History of blood transfusion    Hyperkalemia    Hyperparathyroidism (HCC)    Hypertension    Mitral regurgitation    Persistent atrial fibrillation    PONV (postoperative nausea and vomiting)    PVC's (premature ventricular contractions)    Tachycardia-bradycardia syndrome ([place])    s/p PPM 02/2013   Past Surgical History:  Procedure Laterality Date   AMPUTATION Right 11/27/2014   Procedure: RIGHT 2ND AND 3RD TOE AMPUTATION;  Surgeon: Wylene Simmer, MD;  Location: [place];  Service: Orthopedics;  Laterality: Right;   ATRIAL FIBRILLATION ABLATION N/A 05/29/2019   Procedure: AVN ABLATION;  Surgeon: Evans Lance, MD;  Location: [facility];  Service: Cardiovascular;  Laterality: N/A;   BUNIONECTOMY     Right   CARDIAC CATHETERIZATION  09/25/2000   EF of 50% -- with normal left ventricular size and function   CARDIOVERSION N/A 12/28/2017   Procedure: CARDIOVERSION;  Surgeon: Dorothy Spark, MD;  Location[facility] ENDOSCOPY;  Service: Cardiovascular;  Laterality: N/A;   CARDIOVERSION N/A 02/25/2018   Procedure: CARDIOVERSION;  Surgeon: Skeet Latch, MD;  Location: [place];  Service: Cardiovascular;  Laterality: N/A;   [place] Bilateral    Cataract   FOOT SURGERY     INSERT / REPLACE / REMOVE PACEMAKER  02/2013   PACEMAKER INSERTION  02/2013   PERMANENT  PACEMAKER INSERTION N/A 03/02/2013   Procedure: PERMANENT PACEMAKER INSERTION;  Surgeon: Evans Lance, MD;  Location: [facility] CATH LAB;  Service: Cardiovascular;  Laterality: N/A;   SHOULDER ARTHROSCOPY W/ ROTATOR CUFF REPAIR     SHOULDER SURGERY Right    roto cuff   TOE AMPUTATION Left    2nd and 3rd, bunion under toes.   TONSILLECTOMY     VARICOSE VEIN SURGERY       Current Meds  Medication Sig   acetaminophen (TYLENOL) 500 MG tablet Take 1,000 mg by mouth 2 (two) times daily as needed for mild pain. For pain    carvedilol (COREG) 3.125 MG tablet Take 1/2 tablet twice a day   dorzolamide (TRUSOPT) 2 % ophthalmic solution Place 1 drop into the right eye 2 (two) times daily.   LORazepam (ATIVAN) 0.5 MG tablet Take 1 tablet (0.5 mg total) by mouth at bedtime.   Multiple Vitamin (MULTIVITAMIN WITH MINERALS) TABS Take 1 tablet by mouth daily.   omeprazole (PRILOSEC) 20 MG capsule Take 20 mg by mouth daily.   warfarin (COUMADIN) 5 MG tablet TAKE 1/2 TO 1 TABLET DAILY AS DIRECTED BY ANTICOAGULATION CLINIC (Patient taking  differently: Take 2.5-5 mg by mouth See admin instructions. Take 2.'5mg'$  daily everyday except on mondays take '5mg'$  daily)     Allergies:   Ace inhibitors, Losartan, Codeine, and Tramadol   Social History   Tobacco Use   Smoking status: Never Smoker   Smokeless tobacco: Never Used  Substance Use Topics   Alcohol use: No   Drug use: No     Family Hx: The patient's family history includes Hypertension in her father and mother.  ROS:   Please see the history of present illness.    All other systems reviewed and are negative.   Prior CV studies:    Most recent pertinent cardiac studies are outlined above.  Labs/Other Tests and Data Reviewed:    EKG:  An ECG dated 05/28/19 was personally reviewed today and demonstrated:  atrial fib 113bpm, NSIVCD, nonspecific STT changes (pre AVN ablation)  Recent Labs: 01/24/2019: NT-Pro BNP 443 04/13/2019: TSH  2.804 05/25/2019: B Natriuretic Peptide 543.7 05/27/2019: Hemoglobin 10.0; Platelets 230 05/28/2019: Magnesium 1.8 05/30/2019: BUN 28; Creatinine, Ser 1.32; Potassium 4.3; Sodium 141   Recent Lipid Panel Lab Results  Component Value Date/Time   CHOL 156 05/23/2012 05:14 AM   TRIG 86 05/23/2012 05:14 AM   HDL 80 05/23/2012 05:14 AM   CHOLHDL 2.0 05/23/2012 05:14 AM   LDLCALC 59 05/23/2012 05:14 AM    Wt Readings from Last 3 Encounters:  06/26/19 185 lb (83.9 kg)  06/14/19 189 lb (85.7 kg)  05/30/19 183 lb (83 kg)     Objective:    Vital Signs:  BP 106/72    Pulse 83    Ht '5\' 5"'$  (1.651 m)    Wt 185 lb (83.9 kg)    BMI 30.79 kg/m    VS reviewed. General - pleasant, well spoken elderly F in no acute distress Pulm - No labored breathing, no coughing during visit, no audible wheezing, speaking in full sentences Neuro - A+Ox3, no slurred speech, answers questions appropriately Psych - Pleasant affect     ASSESSMENT & PLAN:    1. Persistent cough/SOB - also with general malaise, fatigue, poor appetite. Symptoms have persisted despite attempts to address them from a cardiac standpoint. With her worsening atrial arrhythmias recently I wonder if there is perhaps an underlying cause outside the cardiac system which incited the uptick in her rhythm abnormalities. Given her dyspnea, fatigue, persistent dry cough which did not improve with diuresis, and poor appetite, would recommend a high resolution CT scan to evaluate for interstitial lung disease. I am not sure whether her hyperparathyroidism is playing into this with regard to the general malaise/fatigue, but this typically does not cause pulmonary symptoms. Will arrange f/u labs to include ESR/CRP as well as outlined below. 2. Chronic combined CHF - BP has been prohibitive of aggressive med titration. Unable to use agents ACEi, ARB, spiro as listed above. She barely tolerates low dose beta blocker as it is. This dose does not seem significant  enough to cause her symptoms. She feels her volume status has been relatively stable. Once CT is back, I will plan to review with Dr. Radford Pax regarding further CHF management. She has not had ischemic eval since EF dropped as this was felt due to her arrhythmias. May need to consider ischemic eval + RHC to evaluate hemodynamics - but once again, ischemia would not typically cause the dry cough. It also does not sound like diuresis had any lasting effect on her cough either. Update f/u BNP with labs.  3. Persistent atrial fib - rhythm is currently managed by EP. She is on warfarin chronically. INR followed by PCP. Could consider at some point changing to NOAC but would hold course so as not to complicate the clinical picture by adding another variable at the moment. Will also f/u TSH with fT4 given her fatigue. 4. Aortic insufficiency and mitral regurgitation - if CT unremarkable, will plan to discuss further with Dr. Radford Pax regarding whether there would be any benefit to intervention on her mitral valve in the setting of her LV dysfunction. 5. CKD stage III - recheck f/u BMET with labs. 6. Mild anemia - will check anemia panel and repeat CBC when she returns for CT.  COVID-19 Education: The signs and symptoms of COVID-19 were discussed with the patient and how to seek care for testing (follow up with PCP or arrange E-visit).  The importance of social distancing was discussed today.  Time:   Today, I have spent 21 minutes with the patient with telehealth technology discussing the above problems.    Medication Adjustments/Labs and Tests Ordered: Current medicines are reviewed at length with the patient today.  Concerns regarding medicines are outlined above.   Disposition:  Follow up in-office visit with myself or Dr. Radford Pax after CT.  Signed, [name], PA-C  06/26/2019 2:25 PM    [facility]

## 2019-06-26 ENCOUNTER — Telehealth (INDEPENDENT_AMBULATORY_CARE_PROVIDER_SITE_OTHER): Payer: Medicare Other | Admitting: Physician Assistant

## 2019-06-26 ENCOUNTER — Ambulatory Visit (INDEPENDENT_AMBULATORY_CARE_PROVIDER_SITE_OTHER): Payer: Medicare Other | Admitting: *Deleted

## 2019-06-26 ENCOUNTER — Other Ambulatory Visit: Payer: Self-pay

## 2019-06-26 ENCOUNTER — Encounter: Payer: Self-pay | Admitting: Physician Assistant

## 2019-06-26 VITALS — BP 106/72 | HR 83 | Ht 65.0 in | Wt 185.0 lb

## 2019-06-26 DIAGNOSIS — R05 Cough: Secondary | ICD-10-CM

## 2019-06-26 DIAGNOSIS — I351 Nonrheumatic aortic (valve) insufficiency: Secondary | ICD-10-CM

## 2019-06-26 DIAGNOSIS — I340 Nonrheumatic mitral (valve) insufficiency: Secondary | ICD-10-CM

## 2019-06-26 DIAGNOSIS — N183 Chronic kidney disease, stage 3 unspecified: Secondary | ICD-10-CM

## 2019-06-26 DIAGNOSIS — R0602 Shortness of breath: Secondary | ICD-10-CM | POA: Diagnosis not present

## 2019-06-26 DIAGNOSIS — R059 Cough, unspecified: Secondary | ICD-10-CM

## 2019-06-26 DIAGNOSIS — I495 Sick sinus syndrome: Secondary | ICD-10-CM | POA: Diagnosis not present

## 2019-06-26 DIAGNOSIS — I5042 Chronic combined systolic (congestive) and diastolic (congestive) heart failure: Secondary | ICD-10-CM

## 2019-06-26 DIAGNOSIS — I4819 Other persistent atrial fibrillation: Secondary | ICD-10-CM

## 2019-06-26 DIAGNOSIS — R053 Chronic cough: Secondary | ICD-10-CM

## 2019-06-26 DIAGNOSIS — D649 Anemia, unspecified: Secondary | ICD-10-CM

## 2019-06-26 LAB — CUP PACEART REMOTE DEVICE CHECK
Battery Remaining Longevity: 78 mo
Battery Remaining Percentage: 82 %
Brady Statistic RA Percent Paced: 0 %
Brady Statistic RV Percent Paced: 89 %
Date Time Interrogation Session: 20200831151000
Implantable Lead Implant Date: 20140508
Implantable Lead Implant Date: 20140508
Implantable Lead Location: 753859
Implantable Lead Location: 753860
Implantable Lead Model: 4135
Implantable Lead Model: 4136
Implantable Lead Serial Number: 29333020
Implantable Lead Serial Number: 29379474
Implantable Pulse Generator Implant Date: 20140508
Lead Channel Impedance Value: 594 Ohm
Lead Channel Impedance Value: 630 Ohm
Lead Channel Pacing Threshold Amplitude: 1.1 V
Lead Channel Pacing Threshold Pulse Width: 0.4 ms
Lead Channel Setting Pacing Amplitude: 2.4 V
Lead Channel Setting Pacing Pulse Width: 0.4 ms
Lead Channel Setting Sensing Sensitivity: 2.5 mV
Pulse Gen Serial Number: 112392

## 2019-06-26 NOTE — Progress Notes (Signed)
I would go ahead and refer to pulmonary but agree with CT as well

## 2019-06-26 NOTE — Patient Instructions (Addendum)
Medication Instructions:  Your physician recommends that you continue on your current medications as directed. Please refer to the Current Medication list given to you today.  If you need a refill on your cardiac medications before your next appointment, please call your pharmacy.   Lab work:  06/29/2019:  ANEMIA PANEL, CBC, BMET, PRO BNP, TSH, & FREE T4  If you have labs (blood work) drawn today and your tests are completely normal, you will receive your results only by: Marland Kitchen MyChart Message (if you have MyChart) OR . A paper copy in the mail If you have any lab test that is abnormal or we need to change your treatment, we will call you to review the results.  Testing/Procedures: Non-Cardiac CT scanning, (CAT scanning), 07/06/2019 ARRIVE AT [facility] AT 3:15 FOR A 3:30 APPT.  is a noninvasive, special x-ray that produces cross-sectional images of the body using x-rays and a computer. CT scans help physicians diagnose and treat medical conditions. For some CT exams, a contrast material is used to enhance visibility in the area of the body being studied. CT scans provide greater clarity and reveal more details than regular x-ray exams.    Follow-Up: At [facility], you and your health needs are our priority.  As part of our continuing mission to provide you with exceptional heart care, we have created designated Provider Care Teams.  These Care Teams include your primary Cardiologist (physician) and Advanced Practice Providers (APPs -  Physician Assistants and Nurse Practitioners) who all work together to provide you with the care you need, when you need it. . You are scheduled for an in-office appointment, 07/07/2019 ARRIVE AT 1:30 FOR 1:45 APPT WITH DAYNA DUNN, PA-C  Any Other Special Instructions Will Be Listed Below (If Applicable).

## 2019-06-27 ENCOUNTER — Telehealth: Payer: Self-pay | Admitting: Physician Assistant

## 2019-06-27 DIAGNOSIS — R0602 Shortness of breath: Secondary | ICD-10-CM

## 2019-06-27 DIAGNOSIS — R05 Cough: Secondary | ICD-10-CM

## 2019-06-27 DIAGNOSIS — R059 Cough, unspecified: Secondary | ICD-10-CM

## 2019-06-27 NOTE — Telephone Encounter (Signed)
I informed patient of Dayna's referral to Pulmonology.  She verbalized understanding.

## 2019-06-27 NOTE — Telephone Encounter (Signed)
   Please let [name] I reviewed her case with Dr. Radford Pax who agrees with plan for CT, she also requests we go ahead and initiate referral to pulmonology as well for SOB/cough. Please refer. Sending to triage as [name] is out for the next week. Dayna Dunn PA-C

## 2019-06-28 ENCOUNTER — Telehealth: Payer: Self-pay

## 2019-06-28 NOTE — Telephone Encounter (Signed)
    COVID-19 Pre-Screening Questions:  . In the past 7 to 10 days have you had a cough,  shortness of breath, headache, congestion, fever (100 or greater) body aches, chills, sore throat, or sudden loss of taste or sense of smell? No . Have you been around anyone with known Covid 19. No . Have you been around anyone who is awaiting Covid 19 test results in the past 7 to 10 days? No . Have you been around anyone who has been exposed to Covid 19, or has mentioned symptoms of Covid 19 within the past 7 to 10 days? NO  If you have any concerns/questions about symptoms patients report during screening (either on the phone or at threshold). Contact the provider seeing the patient or DOD for further guidance.  If neither are available contact a member of the leadership team.          Pt answered no to all covid-19 prescreening questions. I asked the pt to wear a mask. I let the pt know that we are reducing the number of people coming into the office and if she can physically come alone to please do so. The pt verbalized understanding.

## 2019-06-29 ENCOUNTER — Ambulatory Visit (INDEPENDENT_AMBULATORY_CARE_PROVIDER_SITE_OTHER): Payer: Medicare Other | Admitting: *Deleted

## 2019-06-29 ENCOUNTER — Other Ambulatory Visit: Payer: Medicare Other | Admitting: *Deleted

## 2019-06-29 ENCOUNTER — Other Ambulatory Visit: Payer: Self-pay

## 2019-06-29 DIAGNOSIS — R059 Cough, unspecified: Secondary | ICD-10-CM

## 2019-06-29 DIAGNOSIS — I5042 Chronic combined systolic (congestive) and diastolic (congestive) heart failure: Secondary | ICD-10-CM

## 2019-06-29 DIAGNOSIS — R0602 Shortness of breath: Secondary | ICD-10-CM | POA: Diagnosis not present

## 2019-06-29 DIAGNOSIS — R05 Cough: Secondary | ICD-10-CM

## 2019-06-29 DIAGNOSIS — I351 Nonrheumatic aortic (valve) insufficiency: Secondary | ICD-10-CM | POA: Diagnosis not present

## 2019-06-29 DIAGNOSIS — I4819 Other persistent atrial fibrillation: Secondary | ICD-10-CM | POA: Diagnosis not present

## 2019-06-29 DIAGNOSIS — I495 Sick sinus syndrome: Secondary | ICD-10-CM | POA: Diagnosis not present

## 2019-06-29 DIAGNOSIS — I340 Nonrheumatic mitral (valve) insufficiency: Secondary | ICD-10-CM

## 2019-06-29 DIAGNOSIS — N183 Chronic kidney disease, stage 3 unspecified: Secondary | ICD-10-CM

## 2019-06-29 DIAGNOSIS — R053 Chronic cough: Secondary | ICD-10-CM

## 2019-06-29 DIAGNOSIS — D649 Anemia, unspecified: Secondary | ICD-10-CM | POA: Diagnosis not present

## 2019-06-29 LAB — CUP PACEART INCLINIC DEVICE CHECK
Date Time Interrogation Session: 20200903155441
Implantable Lead Implant Date: 20140508
Implantable Lead Implant Date: 20140508
Implantable Lead Location: 753859
Implantable Lead Location: 753860
Implantable Lead Model: 4135
Implantable Lead Model: 4136
Implantable Lead Serial Number: 29333020
Implantable Lead Serial Number: 29379474
Implantable Pulse Generator Implant Date: 20140508
Lead Channel Pacing Threshold Amplitude: 0.6 V
Lead Channel Pacing Threshold Pulse Width: 0.4 ms
Lead Channel Sensing Intrinsic Amplitude: 10.4 mV
Lead Channel Setting Pacing Amplitude: 2.4 V
Lead Channel Setting Pacing Pulse Width: 0.4 ms
Lead Channel Setting Sensing Sensitivity: 2.5 mV
Pulse Gen Serial Number: 112392

## 2019-06-29 NOTE — Progress Notes (Signed)
Pacemaker check in clinic. Normal device function. Thresholds, sensing, impedances consistent with previous measurements. Device programmed to maximize longevity. No high ventricular rates noted. Device programmed at appropriate safety margins. LRL decreased to 60 bpm per industry due to rate response histograms , 4 weeks s/p av ablation. Device programmed to optimize intrinsic conduction. Estimated longevity 6.5 yrs. Patient enrolled in remote follow-up , next remote f/u 09/25/19. F/U with Dr [name] 09/13/19. Patient education completed.

## 2019-06-29 NOTE — Patient Instructions (Signed)
Keep follow -up appointments. Call office if you have any questions or concerns.

## 2019-06-30 ENCOUNTER — Telehealth: Payer: Self-pay | Admitting: *Deleted

## 2019-06-30 DIAGNOSIS — R700 Elevated erythrocyte sedimentation rate: Secondary | ICD-10-CM

## 2019-06-30 LAB — FERRITIN: Ferritin: 47 ng/mL (ref 15–150)

## 2019-06-30 LAB — BASIC METABOLIC PANEL
BUN/Creatinine Ratio: 17 (ref 12–28)
BUN: 19 mg/dL (ref 8–27)
CO2: 21 mmol/L (ref 20–29)
Calcium: 10.6 mg/dL — ABNORMAL HIGH (ref 8.7–10.3)
Chloride: 107 mmol/L — ABNORMAL HIGH (ref 96–106)
Creatinine, Ser: 1.14 mg/dL — ABNORMAL HIGH (ref 0.57–1.00)
GFR calc Af Amer: 51 mL/min/{1.73_m2} — ABNORMAL LOW (ref >59)
GFR calc non Af Amer: 44 mL/min/{1.73_m2} — ABNORMAL LOW (ref >59)
Glucose: 108 mg/dL — ABNORMAL HIGH (ref 65–99)
Potassium: 5.2 mmol/L (ref 3.5–5.2)
Sodium: 141 mmol/L (ref 134–144)

## 2019-06-30 LAB — CBC
Hematocrit: 29.7 % — ABNORMAL LOW (ref 34.0–46.6)
Hemoglobin: 9.5 g/dL — ABNORMAL LOW (ref 11.1–15.9)
MCH: 29.4 pg (ref 26.6–33.0)
MCHC: 32 g/dL (ref 31.5–35.7)
MCV: 92 fL (ref 79–97)
Platelets: 299 10*3/uL (ref 150–450)
RBC: 3.23 x10E6/uL — ABNORMAL LOW (ref 3.77–5.28)
RDW: 14 % (ref 11.7–15.4)
WBC: 8.2 10*3/uL (ref 3.4–10.8)

## 2019-06-30 LAB — IRON AND TIBC
Iron Saturation: 7 % — CL (ref 15–55)
Iron: 24 ug/dL — ABNORMAL LOW (ref 27–139)
Total Iron Binding Capacity: 333 ug/dL (ref 250–450)
UIBC: 309 ug/dL (ref 118–369)

## 2019-06-30 LAB — PRO B NATRIURETIC PEPTIDE: NT-Pro BNP: 3228 pg/mL — ABNORMAL HIGH (ref 0–738)

## 2019-06-30 LAB — TSH: TSH: 5.12 u[IU]/mL — ABNORMAL HIGH (ref 0.450–4.500)

## 2019-06-30 LAB — FOLATE: Folate: >20 ng/mL (ref >3.0)

## 2019-06-30 LAB — VITAMIN B12: Vitamin B-12: 956 pg/mL (ref 232–1245)

## 2019-06-30 LAB — C-REACTIVE PROTEIN: CRP: 10 mg/L (ref 0–10)

## 2019-06-30 LAB — T4, FREE: Free T4: 1.21 ng/dL (ref 0.82–1.77)

## 2019-06-30 LAB — SEDIMENTATION RATE: Sed Rate: 76 mm/hr — ABNORMAL HIGH (ref 0–40)

## 2019-06-30 NOTE — Telephone Encounter (Addendum)
I am not sure what was the indication for ordering these labs. Ideally appt should have been arranged,it is difficult for me to give instructions to pt,who I have not seen.  If she is not having acute symptoms that warrant ER evaluation,appt can be arranged,so we can go through lab results. I see mild elevated ca++ and stable renal function. Elevated sed rate, elevation can be related with multiple problems, I am not sure if it was ordered because an specific symptoms,usually indicates inflammation.   Thanks, BJ

## 2019-06-30 NOTE — Telephone Encounter (Signed)
-----   Message from Molly [name], [place] sent at 06/30/2019 10:36 AM EDT ----- Please call patient - very complex, sweet patient with multitude of recent issues.   First, continue plan as discussed for CT.  Her BNP is elevated suggestive of some fluid overload. Need to be cautious with diuretic given her tendency towards low BP. I would hold carvedilol altogether right now to allow some BP room to get her on a regular plan for diuretic. Would do Lasix 20mg  once daily for 3 days then go to every other day until follow-up. She should call us if she experiences any dizziness or blood pressures less than 100.  There are some abnormalities that I think need attention by her primary care, as I wonder how much these might be contributing to her overall clinical picture. Can you please call patient's PCP to relay this information, particularly the critical iron saturation that returned?  - Regarding her anemia, her hemoglobin is generally stable over the last 2 months but still lower than where it was in the spring. Her anemia panel shows that her iron level is low and her saturation level is low as well, suggesting she may benefit from supplemental iron. This can be in the form of oral or infusion but will defer to primary care in their expertise of this - Her thyroid function suggests she may have low thyroid hormone, which can contribute to fatigue - Her calcium level has also risen so I wonder to what degree her hyperparathyroidism has to do with everything she is experiencing  Thanks, Lisbeth Renshaw

## 2019-06-30 NOTE — Telephone Encounter (Signed)
I spoke with pt briefly. She is unable to talk now and will call us back.  I spoke with PCP office and they request I route phone note to Dr. Martinique

## 2019-06-30 NOTE — Telephone Encounter (Signed)
I spoke with pt and gave her instructions from Melina Copa, [place]

## 2019-07-04 ENCOUNTER — Encounter: Payer: Self-pay | Admitting: Cardiology

## 2019-07-04 NOTE — Progress Notes (Signed)
Remote pacemaker transmission.   

## 2019-07-05 DIAGNOSIS — Z89421 Acquired absence of other right toe(s): Secondary | ICD-10-CM | POA: Diagnosis not present

## 2019-07-05 DIAGNOSIS — I471 Supraventricular tachycardia: Secondary | ICD-10-CM | POA: Diagnosis not present

## 2019-07-05 DIAGNOSIS — N183 Chronic kidney disease, stage 3 (moderate): Secondary | ICD-10-CM | POA: Diagnosis not present

## 2019-07-05 DIAGNOSIS — I5043 Acute on chronic combined systolic (congestive) and diastolic (congestive) heart failure: Secondary | ICD-10-CM | POA: Diagnosis not present

## 2019-07-05 DIAGNOSIS — G8929 Other chronic pain: Secondary | ICD-10-CM | POA: Diagnosis not present

## 2019-07-05 DIAGNOSIS — Z85828 Personal history of other malignant neoplasm of skin: Secondary | ICD-10-CM | POA: Diagnosis not present

## 2019-07-05 DIAGNOSIS — I420 Dilated cardiomyopathy: Secondary | ICD-10-CM | POA: Diagnosis not present

## 2019-07-05 DIAGNOSIS — Z950 Presence of cardiac pacemaker: Secondary | ICD-10-CM | POA: Diagnosis not present

## 2019-07-05 DIAGNOSIS — I4819 Other persistent atrial fibrillation: Secondary | ICD-10-CM | POA: Diagnosis not present

## 2019-07-05 DIAGNOSIS — M545 Low back pain: Secondary | ICD-10-CM | POA: Diagnosis not present

## 2019-07-05 DIAGNOSIS — I080 Rheumatic disorders of both mitral and aortic valves: Secondary | ICD-10-CM | POA: Diagnosis not present

## 2019-07-05 DIAGNOSIS — I495 Sick sinus syndrome: Secondary | ICD-10-CM | POA: Diagnosis not present

## 2019-07-05 DIAGNOSIS — I130 Hypertensive heart and chronic kidney disease with heart failure and stage 1 through stage 4 chronic kidney disease, or unspecified chronic kidney disease: Secondary | ICD-10-CM | POA: Diagnosis not present

## 2019-07-05 DIAGNOSIS — K219 Gastro-esophageal reflux disease without esophagitis: Secondary | ICD-10-CM | POA: Diagnosis not present

## 2019-07-05 DIAGNOSIS — Z7901 Long term (current) use of anticoagulants: Secondary | ICD-10-CM | POA: Diagnosis not present

## 2019-07-05 DIAGNOSIS — F419 Anxiety disorder, unspecified: Secondary | ICD-10-CM | POA: Diagnosis not present

## 2019-07-05 NOTE — Telephone Encounter (Signed)
Pt has been scheduled for 07/12/2019 for virtual. Pt has to meet at the office for coumadin clinic and stated she can get the labs when she comes if needed!

## 2019-07-06 ENCOUNTER — Other Ambulatory Visit: Payer: Self-pay

## 2019-07-06 ENCOUNTER — Ambulatory Visit (HOSPITAL_COMMUNITY)
Admission: RE | Admit: 2019-07-06 | Discharge: 2019-07-06 | Disposition: A | Discharge status: Home / Self Care | Payer: Medicare Other | Source: Ambulatory Visit | Attending: Physician Assistant | Admitting: Physician Assistant

## 2019-07-06 DIAGNOSIS — R0602 Shortness of breath: Secondary | ICD-10-CM | POA: Diagnosis not present

## 2019-07-06 DIAGNOSIS — R05 Cough: Secondary | ICD-10-CM | POA: Insufficient documentation

## 2019-07-06 DIAGNOSIS — R059 Cough, unspecified: Secondary | ICD-10-CM

## 2019-07-06 NOTE — Progress Notes (Signed)
Cardiology Office Note    Date:  07/07/2019   ID:  Molly [name], DOB 02-12-1933, MRN [id number]  PCP:  Martinique, Betty G, MD  Cardiologist:  Fransico Him, MD  Electrophysiologist:  [name], MD   Chief Complaint: f/u SOB - discuss lab results and CT  History of Present Illness:   Molly [name] is a 85 y.o. female with history of chronic combined systolic/diastolic CHF (presumed NICM),aortic insufficiency, mitral regurgitation, hyperlipidemia, hypertension, tachy-brady syndrome s/p PPM implant 2014, persistent atrial fibrillation s/p recent AVN ablation, CKD stage III by labs, hyperparathyroidism (mananged conservatively), PVCs who presents for f/u of testing.  She has long history of atrial fib going back many years and required PPM as above. In 2017 her echo showed reduction in EF to 40-45% with mild AI and mild-moderate MR, so nuclear stress test was pursued which showed no ischemia. Most recent echocardiogram in 05/26/19 showed further decline in EF to 30-35%, normal RVSP, moderate LAE, mild RAE, moderate mitral regurgitation, mild aortic regurgitation.Her afib has been difficult to control and she has been followed by EP and the afib clinic. She failed amiodarone, Tikosyn and sotalol in the past. She was not felt to be a good candidate for afib ablation given her severely dilated LA. She was then admitted 05/25/19 with worsening fatigue, dyspnea, and weight gain with mildly elevated BNP and uncontrolled atrial arrhythmias. She was treated with IV Lasix and underwent AV node ablation 05/29/19 and therefore HR is now dictated by her pacemaker. Her Lasix was stopped due to AKI. She has history of hyperkalemia with ACEI, ARB, and spironolactone and blood pressure has otherwise been prohibitive of aggressive med titration. She was seen by Oda Kilts in f/u 06/14/19 and still reported fatigue so PPM settings were adjusted with planned EP f/u.  Despite cardiac measures undertaken above,  she had not really felt better. She describes generalized fatigue ("I'm wiped out no matter what I do"), dyspnea on exertion, poor appetite, and a persistent dry cough that has been present for at least 2 months' time. I met her for the first time virtually 06/26/19. The cough has not resolved with diuresis and the fatigue did not change with pacemaker setting adjustments or medication adjustments, leading me to question whether there was a non-cardiac issue as a unifying cause for her symptoms. Labs 06/29/19 showed elevated ESR 76, normal CRP, TSH 5.120, PBNP 3228, Hgb 9.5, normal ferritin, severely low iron saturation at 7% with low iron level, normal TIBC, normal B12/folate, Cr 1.14, calcium 10.6.  I asked her to take Lasix 43m x 3 days then go to every other day thereafter. Carvedilol was stopped to allow blood pressure room for this change. It remained unclear to what degree her iron, thyroid and hyperparathyroidism were contributing, so I advised she f/u with PCP for this. It appears she has a virtual visit scheduled. High resolution CT done for her cough and fatigue showed early/mild interstitial lung disease with a spectrum of findings considered indeterminate for usual interstitial pneumonia (UIP), trace bilateral pleural effusions vs pleural thickening, 330mRML nodule, aortic and coronary atherosclerosis. She has been referred to pulmonary and sees Dr. ClCarlis Abbott/17.  At this point I think her symptoms are multifactorial, contributed by her abnormal CT scan (?ILD) compounded by her iron deficiency, hyperparathyroidism, possible hypothyroidism with abnormal TSH, LV dysfunction and valvular disease. I will be grateful for primary care and pulmonary input.   Past Medical History:  Diagnosis Date  . Aortic insufficiency   .  Atrial tachycardia ([place])   . Cancer ([place])    Skin cancer- basal.  1 mylenoma  . Chronic combined systolic and diastolic CHF (congestive heart failure) ([place])   . CKD (chronic kidney  disease), stage III ([place])   . Complication of anesthesia   . DCM (dilated cardiomyopathy) ([place])    EF 40-45% by echo 2017 - nuclear stress test with no ischemia  . GERD (gastroesophageal reflux disease)   . H/O benign essential tremor    on amiodarone resolved off amio  . H/O cardiac radiofrequency ablation    a. AV node ablation in 05/2019.  Marland Kitchen H/O hyperkalemia    Resolved off ACE inhibitors  . High cholesterol   . History of blood transfusion   . Hyperkalemia   . Hyperparathyroidism ([place])   . Hypertension   . Mitral regurgitation   . Persistent atrial fibrillation   . PONV (postoperative nausea and vomiting)   . PVC's (premature ventricular contractions)   . Tachycardia-bradycardia syndrome [facility])    s/p PPM 02/2013    Past Surgical History:  Procedure Laterality Date  . AMPUTATION Right 11/27/2014   Procedure: RIGHT 2ND AND 3RD TOE AMPUTATION;  Surgeon: Wylene Simmer, MD;  Location: [place];  Service: Orthopedics;  Laterality: Right;  . ATRIAL FIBRILLATION ABLATION N/A 05/29/2019   Procedure: AVN ABLATION;  Surgeon: Evans Lance, MD;  Location: [place] CV LAB;  Service: Cardiovascular;  Laterality: N/A;  . BUNIONECTOMY     Right  . CARDIAC CATHETERIZATION  09/25/2000   EF of 50% -- with normal left ventricular size and function  . CARDIOVERSION N/A 12/28/2017   Procedure: CARDIOVERSION;  Surgeon: Dorothy Spark, MD;  [facility];  Service: Cardiovascular;  Laterality: N/A;  . CARDIOVERSION N/A 02/25/2018   Procedure: CARDIOVERSION;  Surgeon: Skeet Latch, MD;  Location: [place];  Service: Cardiovascular;  Laterality: N/A;  . CESAREAN SECTION    . CESAREAN SECTION    . EYE SURGERY Bilateral    Cataract  . FOOT SURGERY    . INSERT / REPLACE / REMOVE PACEMAKER  02/2013  . PACEMAKER INSERTION  02/2013  . PERMANENT PACEMAKER INSERTION N/A 03/02/2013   Procedure: PERMANENT PACEMAKER INSERTION;  Surgeon: Evans Lance, MD;  Location: [facility] CATH LAB;  Service:  Cardiovascular;  Laterality: N/A;  . SHOULDER ARTHROSCOPY W/ ROTATOR CUFF REPAIR    . SHOULDER SURGERY Right    roto cuff  . TOE AMPUTATION Left    2nd and 3rd, bunion under toes.  . TONSILLECTOMY    . VARICOSE VEIN SURGERY      Current Medications: Current Meds  Medication Sig  . acetaminophen (TYLENOL) 500 MG tablet Take 1,000 mg by mouth 2 (two) times daily as needed for mild pain. For pain   . dorzolamide (TRUSOPT) 2 % ophthalmic solution Place 1 drop into the right eye 2 (two) times daily.  . furosemide (LASIX) 20 MG tablet Take 20 mg by mouth every other day.  Marland Kitchen LORazepam (ATIVAN) 0.5 MG tablet Take 1 tablet (0.5 mg total) by mouth at bedtime.  . Multiple Vitamin (MULTIVITAMIN WITH MINERALS) TABS Take 1 tablet by mouth daily.  Marland Kitchen omeprazole (PRILOSEC) 20 MG capsule Take 20 mg by mouth daily.  Marland Kitchen warfarin (COUMADIN) 5 MG tablet TAKE 1/2 TO 1 TABLET DAILY AS DIRECTED BY ANTICOAGULATION CLINIC (Patient taking differently: Take 2.5-5 mg by mouth See admin instructions. Take 2.'5mg'$  daily everyday except on mondays take '5mg'$  daily)  . [DISCONTINUED] carvedilol (COREG) 3.125 MG  tablet TAKE 1/2 TABLET TWICE A DAY WITH A MEAL  . [DISCONTINUED] furosemide (LASIX) 20 MG tablet Take 20 mg by mouth daily as needed for fluid or edema.     Allergies:   Ace inhibitors, Losartan, Codeine, and Tramadol   Social History   Socioeconomic History  . Marital status: Married    Spouse name: Not on file  . Number of children: 3  . Years of education: Not on file  . Highest education level: Not on file  Occupational History  . Not on file  Social Needs  . Financial resource strain: Not on file  . Food insecurity    Worry: Not on file    Inability: Not on file  . Transportation needs    Medical: Not on file    Non-medical: Not on file  Tobacco Use  . Smoking status: Never Smoker  . Smokeless tobacco: Never Used  Substance and Sexual Activity  . Alcohol use: No  . Drug use: No  . Sexual  activity: Not Currently  Lifestyle  . Physical activity    Days per week: Not on file    Minutes per session: Not on file  . Stress: Not on file  Relationships  . Social Herbalist on phone: Not on file    Gets together: Not on file    Attends religious service: Not on file    Active member of club or organization: Not on file    Attends meetings of clubs or organizations: Not on file    Relationship status: Not on file  Other Topics Concern  . Not on file  Social History Narrative  . Not on file     Family History:  The patient's family history includes Hypertension in her father and mother.  ROS:   Please see the history of present illness.  All other systems are reviewed and otherwise negative.    EKGs/Labs/Other Studies Reviewed:    Studies reviewed were summarized above.   EKG:  EKG is ordered today, personally reviewed, demonstrating V paced rhythm 65bpm, further analysis challenging given pacing. Underlying AF.  Recent Labs: 05/25/2019: B Natriuretic Peptide 543.7 05/28/2019: Magnesium 1.8 06/29/2019: BUN 19; Creatinine, Ser 1.14; Hemoglobin 9.5; NT-Pro BNP 3,228; Platelets 299; Potassium 5.2; Sodium 141; TSH 5.120  Recent Lipid Panel    Component Value Date/Time   CHOL 156 05/23/2012 0514   TRIG 86 05/23/2012 0514   HDL 80 05/23/2012 0514   CHOLHDL 2.0 05/23/2012 0514   VLDL 17 05/23/2012 0514   LDLCALC 59 05/23/2012 0514    PHYSICAL EXAM:    VS:  BP 109/62   Pulse 65   Ht _0  (1.651 m)   Wt 183 lb 14.2 oz (83.4 kg)   SpO2 97%   BMI 30.60 kg/m   BMI: Body mass index is 30.6 kg/m.  GEN: Well nourished, well developed obese WF, in no acute distress HEENT: normocephalic, atraumatic Neck: no JVD, carotid bruits, or masses Cardiac: RRR; no murmurs, rubs, or gallops, no edema  Respiratory:  Coarse but clear to auscultation bilaterally, normal work of breathing GI: soft, nontender, nondistended, + BS MS: no deformity or atrophy Skin: warm and  dry, no rash Neuro:  Alert and Oriented x 3, Strength and sensation are intact, follows commands Psych: euthymic mood, full affect  Wt Readings from Last 3 Encounters:  07/07/19 183 lb 14.2 oz (83.4 kg)  06/26/19 185 lb (83.9 kg)  06/14/19 189 lb (85.7 kg)  ASSESSMENT & PLAN:   1. Shortness of breath/cough/fatigue - At this point I think her symptoms are multifactorial, contributed by her abnormal CT scan compounded by her iron deficiency, hyperparathyroidism, hypothyroidism, LV dysfunction and valvular disease. She has f/u with both pulmonary and primary care. I will be grateful for their assistance handling her non-cardiac issues which are likely impacting her well-being. Will continue Lasix every other day as she appears euvolemic and did not really have a lot of fluid on her CT scan. Her blood pressure prohibits aggressive medication titration otherwise. See below for additional thoughts. 2. Acute on chronic combined CHF - appears euvolemic on exam today. Continue regimen as above. Beta blocker was recently stopped to allow more BP room for her diuretic. Her initial BP was 138 by the tech but recheck 109/62 which is more consistent with where she tends to run at home. Her EF drop was felt to be due to her atrial arrhythmias recently. She does have coronary atherosclerosis on CT. May need to consider ischemic evaluation + RHC to evaluate hemodynamics but do not suspect ischemia would cause the dry cough and poor appetite. Diuresis did not have any lasting effect. Will have her f/u with Dr. Radford Pax closely to see whether she has any improvements with anticipated changes pulmonary/PCP may make. 3. Persistent AF s/p AVN ablation/PPM - followed by EP. She reports she's been chronically on warfarin because it's worked well for her over the years.  4. Aortic insufficiency/mitral regurgitation - follow clinically for now. Again, if patient does not feel better with medical optimization otherwise will  need to consider whether valvular intervention is necessary. 5. CKD stage III - recheck BMET today given that we increased Lasix.  6. Medical issues including anemia, hyperparathyroidism with hypercalcemia, and abnormal TSH - as outlined above. It looks like IM wanted to have an office visit to discuss labs which is pending. I'm not sure if she will require consideration for IV iron repletion but will defer to primary care. In the interim will start ferrous sulfate 350m TID.   Disposition: F/u with Dr. TRadford Paxin 07/2019 as scheduled, otherwise also has EP f/u planned as well.  Medication Adjustments/Labs and Tests Ordered: Current medicines are reviewed at length with the patient today.  Concerns regarding medicines are outlined above. Medication changes, Labs and Tests ordered today are summarized above and listed in the Patient Instructions accessible in Encounters.   Signed, DCharlie Pitter PA-C  07/07/2019 2:41 PM    [facility] [address] [place]  [id number]: [phone] Fax: [phone]

## 2019-07-07 ENCOUNTER — Ambulatory Visit (INDEPENDENT_AMBULATORY_CARE_PROVIDER_SITE_OTHER): Payer: Medicare Other | Admitting: Physician Assistant

## 2019-07-07 ENCOUNTER — Encounter: Payer: Self-pay | Admitting: Physician Assistant

## 2019-07-07 ENCOUNTER — Ambulatory Visit: Payer: Medicare Other | Admitting: Physician Assistant

## 2019-07-07 VITALS — BP 109/62 | HR 65 | Ht 65.0 in | Wt 183.9 lb

## 2019-07-07 DIAGNOSIS — R0602 Shortness of breath: Secondary | ICD-10-CM

## 2019-07-07 DIAGNOSIS — D509 Iron deficiency anemia, unspecified: Secondary | ICD-10-CM | POA: Diagnosis not present

## 2019-07-07 DIAGNOSIS — I4819 Other persistent atrial fibrillation: Secondary | ICD-10-CM | POA: Diagnosis not present

## 2019-07-07 DIAGNOSIS — N183 Chronic kidney disease, stage 3 unspecified: Secondary | ICD-10-CM

## 2019-07-07 DIAGNOSIS — I351 Nonrheumatic aortic (valve) insufficiency: Secondary | ICD-10-CM | POA: Diagnosis not present

## 2019-07-07 DIAGNOSIS — I5043 Acute on chronic combined systolic (congestive) and diastolic (congestive) heart failure: Secondary | ICD-10-CM

## 2019-07-07 DIAGNOSIS — I340 Nonrheumatic mitral (valve) insufficiency: Secondary | ICD-10-CM | POA: Diagnosis not present

## 2019-07-07 DIAGNOSIS — E213 Hyperparathyroidism, unspecified: Secondary | ICD-10-CM

## 2019-07-07 MED ORDER — FERROUS SULFATE 325 (65 FE) MG PO TBEC: 325.0000 mg | DELAYED_RELEASE_TABLET | Freq: Three times a day (TID) | ORAL | 1 refills | Status: DC

## 2019-07-07 NOTE — Addendum Note (Signed)
Addended by: Martinique, BETTY G on: 07/07/2019 07:08 AM   Modules accepted: Orders

## 2019-07-07 NOTE — Addendum Note (Signed)
Addended by: Gaetano Net on: 07/07/2019 03:06 PM   Modules accepted: Orders

## 2019-07-07 NOTE — Telephone Encounter (Signed)
There were some abnormalities for which it was a concern.  So I am repeating labs to be sure they are stable or improving. She can have labs done a couple days before appt,so we can discuss results. Thanks, BJ

## 2019-07-07 NOTE — Telephone Encounter (Signed)
Lab orders placed. Molly Linzy Martinique, MD

## 2019-07-07 NOTE — Telephone Encounter (Signed)
Patient already had these labs done on 06/30/2019. Husband wants to know does she still need these labs done and do they need to keep the appointment with you on 09/16?

## 2019-07-07 NOTE — Progress Notes (Signed)
Thank you :)

## 2019-07-07 NOTE — Patient Instructions (Signed)
Medication Instructions:  Your physician has recommended you make the following change in your medication:  1.  STAY off Carvedilol 2.  START Ferrous Sulfate 325 mg taking 1 tablet 3 times a day 2.  CONTINUE Lasix 20 mg every other day   If you need a refill on your cardiac medications before your next appointment, please call your pharmacy.   Lab work: TODAY:  BMET  If you have labs (blood work) drawn today and your tests are completely normal, you will receive your results only by: Marland Kitchen MyChart Message (if you have MyChart) OR . A paper copy in the mail If you have any lab test that is abnormal or we need to change your treatment, we will call you to review the results.  Testing/Procedures: None ordered  Follow-Up: At [facility], you and your health needs are our priority.  As part of our continuing mission to provide you with exceptional heart care, we have created designated Provider Care Teams.  These Care Teams include your primary Cardiologist (physician) and Advanced Practice Providers (APPs -  Physician Assistants and Nurse Practitioners) who all work together to provide you with the care you need, when you need it. Marland Kitchen Keep your scheduled follow-up appointments here and Pulmonology    Any Other Special Instructions Will Be Listed Below (If Applicable).

## 2019-07-08 LAB — BASIC METABOLIC PANEL
BUN/Creatinine Ratio: 21 (ref 12–28)
BUN: 27 mg/dL (ref 8–27)
CO2: 22 mmol/L (ref 20–29)
Calcium: 10.8 mg/dL — ABNORMAL HIGH (ref 8.7–10.3)
Chloride: 106 mmol/L (ref 96–106)
Creatinine, Ser: 1.3 mg/dL — ABNORMAL HIGH (ref 0.57–1.00)
GFR calc Af Amer: 44 mL/min/{1.73_m2} — ABNORMAL LOW (ref >59)
GFR calc non Af Amer: 38 mL/min/{1.73_m2} — ABNORMAL LOW (ref >59)
Glucose: 107 mg/dL — ABNORMAL HIGH (ref 65–99)
Potassium: 4.9 mmol/L (ref 3.5–5.2)
Sodium: 142 mmol/L (ref 134–144)

## 2019-07-12 ENCOUNTER — Ambulatory Visit (INDEPENDENT_AMBULATORY_CARE_PROVIDER_SITE_OTHER): Payer: Medicare Other | Admitting: General Practice

## 2019-07-12 ENCOUNTER — Other Ambulatory Visit: Payer: Self-pay

## 2019-07-12 ENCOUNTER — Telehealth (INDEPENDENT_AMBULATORY_CARE_PROVIDER_SITE_OTHER): Payer: Medicare Other | Admitting: Family Medicine

## 2019-07-12 DIAGNOSIS — R5383 Other fatigue: Secondary | ICD-10-CM

## 2019-07-12 DIAGNOSIS — D509 Iron deficiency anemia, unspecified: Secondary | ICD-10-CM

## 2019-07-12 DIAGNOSIS — Z7901 Long term (current) use of anticoagulants: Secondary | ICD-10-CM | POA: Diagnosis not present

## 2019-07-12 DIAGNOSIS — I10 Essential (primary) hypertension: Secondary | ICD-10-CM | POA: Diagnosis not present

## 2019-07-12 LAB — POCT INR: INR: 1.9 — AB (ref 2.0–3.0)

## 2019-07-12 NOTE — Patient Instructions (Signed)
Pre visit review using our clinic review tool, if applicable. No additional management support is needed unless otherwise documented below in the visit note.  Take 1 tablet today and then resume taking 1/2 tablet daily except 1 tablet on Mondays only.  Re-check in 4 weeks.

## 2019-07-12 NOTE — Progress Notes (Signed)
Virtual Visit via Telephone Note  I connected with Molly [name] on 07/12/19 at  2:30 PM EDT by telephone and verified that I am speaking with the correct person using two identifiers.   I discussed the limitations, risks, security and privacy concerns of performing an evaluation and management service by telephone and the availability of in person appointments. I also discussed with the patient that there may be a patient responsible charge related to this service. The patient expressed understanding and agreed to proceed.  Location patient: home Location provider: work or home office Participants present for the call: patient, provider Patient did not have a visit in the prior 7 days to address this/these issue(s).   History of Present Illness:   Molly [name] is a 85 yo female with Hx of atrial fib,chronic pain,CHF, and anxiety who is c/o of "not feeling well" since recent heart procedure, AVN  Ablation on 05/29/19.  "Tired all the time" and getting worse.   Reviewing records we have discussed problem in the past. She gets frustrsted because can "not do all things"she wants to. Her atrial fib is better controlled now,pacemaker and  medications were adjusted. Blood work was done by cardiologist and it showed anemia,elevated ESR,and hyperca++.  Hypercalcemia seem to be stable.  Lab Results  Component Value Date   ESRSEDRATE 76 (H) 06/29/2019    Lab Results  Component Value Date   CRP 10 06/29/2019    She has not noted blood in stool,melena,or gross hematuria. She is on Coumadin. BP at home low 100's/60's and HR 60's.  She denies unusual headache,CP,worsening dyspnea.orthopnea,PND,abdominal pain,N/V, changes in bowel habits,or urinary symptoms. Deneis feeling depressed or anxious. Sleep "good", no known Hx of sleep apnea.  Anemia: She was started on fe sulfate 325 mg tid,she has not tolerated it well. She has taken it x 2 because it causes nausea.  Lab Results  Component  Value Date   WBC 8.2 06/29/2019   HGB 9.5 (L) 06/29/2019   HCT 29.7 (L) 06/29/2019   MCV 92 06/29/2019   PLT 299 06/29/2019    Observations/Objective: Patient sounds cheerful and well on the phone. I do not appreciate any SOB. Speech and thought processing are grossly intact.Very anxious.  Patient reported vitals:BP 116/66   Pulse 64   Ht _0  (1.651 m)   BMI 30.99 kg/m    Assessment and Plan: 1. Hypercalcemia Problem has been stable. 25 OH vit D 04/26/19 was mildly low at 24.1. Will repeat labs in 3-4 weeks.  2. Fatigue, unspecified type Chronic. She tells me that she is not interested in invasive dx procedures but "I want to know."  We discussed possible etiologies of elevated ESR and anemia, includen the possibility of malignancy. She states that she does not want to undergo any cancer treatment,GI work up to determine etiology of anemia,or sleep study to evaluate for OSA as possible cause of fatigue.  She does not want trial of SSRI for possible depression.  3. Essential hypertension On lower normal range. Continue monitoring regularly. No changes   Iron deficiency anemia, unspecified iron deficiency anemia type Recommend taking Fe sulfate 325 mg daily or every other day with Vit C. We can re-check blood in 3-4 weeks.  Follow Up Instructions:  Orders Placed This Encounter  Procedures  . CBC  . VITAMIN D 25 Hydroxy (Vit-D Deficiency, Fractures)  . PTH, intact and calcium    3-4 weeks lab appt.  I did not refer this patient for an OV  in the next 24 hours for this/these issue(s).  I discussed the assessment and treatment plan with the patient. She was provided an opportunity to ask questions and all were answered. She agreed with the plan and demonstrated an understanding of the instructions.   The patient was advised to call back or seek an in-person evaluation if the symptoms worsen or if the condition fails to improve as anticipated.  I provided 40  minutes of non-face-to-face time during this encounter.   Jesaiah Fabiano Martinique, MD

## 2019-07-12 NOTE — Telephone Encounter (Signed)
Patient had ov for today with Dr. Martinique to discuss.

## 2019-07-13 ENCOUNTER — Encounter: Payer: Self-pay | Admitting: Critical Care Medicine

## 2019-07-13 ENCOUNTER — Other Ambulatory Visit: Payer: Self-pay

## 2019-07-13 ENCOUNTER — Ambulatory Visit (INDEPENDENT_AMBULATORY_CARE_PROVIDER_SITE_OTHER): Payer: Medicare Other | Admitting: Critical Care Medicine

## 2019-07-13 VITALS — BP 124/78 | HR 60 | Temp 97.1°F | Ht 65.0 in | Wt 186.2 lb

## 2019-07-13 DIAGNOSIS — R059 Cough, unspecified: Secondary | ICD-10-CM

## 2019-07-13 DIAGNOSIS — R05 Cough: Secondary | ICD-10-CM | POA: Diagnosis not present

## 2019-07-13 DIAGNOSIS — R9389 Abnormal findings on diagnostic imaging of other specified body structures: Secondary | ICD-10-CM

## 2019-07-13 DIAGNOSIS — R0609 Other forms of dyspnea: Secondary | ICD-10-CM | POA: Diagnosis not present

## 2019-07-13 MED ORDER — FLUTICASONE PROPIONATE 50 MCG/ACT NA SUSP: 2.0000 | Freq: Every day | NASAL | 2 refills | Status: DC

## 2019-07-13 NOTE — Progress Notes (Signed)
Synopsis: Referred in September 2020 for DOE & cough by [name], PA-C  Subjective:   PATIENT ID: Molly [name] GENDER: female DOB: 10-08-1933, MRN: [id number]  Chief Complaint  Patient presents with   Consult    Consult for DOE and cough. She reports she developed a dry cough 4 months ago. CT 09/10.    Molly [name] is an 85 year old woman with a history of HFrEF, tachybrady syndrome with difficult to control atrial fibrillation, s/p PCCM placement in 2014.  In August 2020 she underwent an AV node ablation due to lack of other options to control her disease after trials on amiodarone, sotalol, dofetilide.  She presents today with her husband for evaluation of chronic dyspnea on exertion and cough.  She was referred by her cardiology team.  Since her ablation several months ago she has noticed fatigue and dyspnea on exertion.  The dyspnea is stable, not worsening.  She noticed a dry cough that began a month or 2 before her ablation.  Her husband relates that multiple pacemaker changes have been tried to alleviate her symptoms, but as her heart rate on her pacemaker is been dropped her symptoms have only worsened.  She has profound fatigue.  She has had adjustments to her diuretic dose, and when she gained weight her dose was increased again.  Recently that has been stable.  She denies fevers, chills, sweats, myalgias, proximal muscle weakness, synovitis, significant bleeding, or chest pain.  She has chronic back pain which is unchanged.  She has been seeing dermatology for a pruritic rash, which has responded to prednisone but recurs after.  Her cough improved with low-dose prednisone that was prescribed for the rash, but also returned.  She denies postnasal drip.  She does not cough when she eats.  She is a history of GERD, which is currently controlled on a PPI.  She has frequent throat clearing which she attributes to wearing a mask.  She is a never smoker and she has never vape.  No  illicits.  In the past her atrial fibrillation has been very difficult to control.  She was on amiodarone for several years, but stopped several years ago due to a problem.  She is not sure what the problem was, but she had PFTs and chest x-rays every 6 months at [facility] while she was on amiodarone.  Subsequently she was treated with both sotalol and Tikosyn, which were unable to control her arrhythmia, and ultimately she required an ablation earlier in 2020.  She has no previous history of lung disease or rheumatologic disease.  There is no family history of lung disease.  She has never taken Macrobid.  No history of radiation exposure.  She has never had pet birds.  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  Due to the persistence of her symptoms despite cardiology's interventions, they appropriately began looking for alternative causes of her dyspnea.  She has iron deficiency anemia.  She has been on warfarin for many years, and denies bleeding other than occasional hemorrhoid associated rectal bleeding.  She started iron supplements last week.  She underwent a high-resolution CT which demonstrated basilar changes concerning for fibrosis.  She has not yet had her flu shot this season.  Per chart review, amiodarone was stopped in July 2013 after 10 years of use.  Subsequent notes suggest that she had an increased ESR while taking this medication.  In reviewing most of her cardiology notes from  2013 to present, almost all of them note the patient's complaint of profound fatigue.      Past Medical History:  Diagnosis Date   Aortic insufficiency    Atrial tachycardia (HCC)    Cancer (HCC)    Skin cancer- basal.  1 mylenoma   Chronic combined systolic and diastolic CHF (congestive heart failure) (HCC)    CKD (chronic kidney disease), stage III (HCC)    Complication of anesthesia    DCM (dilated cardiomyopathy) ([place])    EF 40-45% by echo 2017 - nuclear stress  test with no ischemia   GERD (gastroesophageal reflux disease)    H/O benign essential tremor    on amiodarone resolved off amio   H/O cardiac radiofrequency ablation    a. AV node ablation in 05/2019.   H/O hyperkalemia    Resolved off ACE inhibitors   High cholesterol    History of blood transfusion    Hyperkalemia    Hyperparathyroidism (HCC)    Hypertension    Mitral regurgitation    Persistent atrial fibrillation    PONV (postoperative nausea and vomiting)    PVC's (premature ventricular contractions)    Tachycardia-bradycardia syndrome ([place])    s/p PPM 02/2013     Family History  Problem Relation Age of Onset   Hypertension Mother    Hypertension Father    Breast cancer Sister      Past Surgical History:  Procedure Laterality Date   AMPUTATION Right 11/27/2014   Procedure: RIGHT 2ND AND 3RD TOE AMPUTATION;  Surgeon: Wylene Simmer, MD;  Location: [place];  Service: Orthopedics;  Laterality: Right;   ATRIAL FIBRILLATION ABLATION N/A 05/29/2019   Procedure: AVN ABLATION;  Surgeon: Evans Lance, MD;  Location: [place] [facility];  Service: Cardiovascular;  Laterality: N/A;   BUNIONECTOMY     Right   CARDIAC CATHETERIZATION  09/25/2000   EF of 50% -- with normal left ventricular size and function   CARDIOVERSION N/A 12/28/2017   Procedure: CARDIOVERSION;  Surgeon: Dorothy Spark, MD;  Location: [facility];  Service: Cardiovascular;  Laterality: N/A;   CARDIOVERSION N/A 02/25/2018   Procedure: CARDIOVERSION;  Surgeon: Skeet Latch, MD;  Location: [place];  Service: Cardiovascular;  Laterality: N/A;   [place] Bilateral    Cataract   FOOT SURGERY     INSERT / REPLACE / REMOVE PACEMAKER  02/2013   PACEMAKER INSERTION  02/2013   PERMANENT PACEMAKER INSERTION N/A 03/02/2013   Procedure: PERMANENT PACEMAKER INSERTION;  Surgeon: Evans Lance, MD;  Location: [facility];  Service: Cardiovascular;   Laterality: N/A;   SHOULDER ARTHROSCOPY W/ ROTATOR CUFF REPAIR     SHOULDER SURGERY Right    roto cuff   TOE AMPUTATION Left    2nd and 3rd, bunion under toes.   TONSILLECTOMY     VARICOSE VEIN SURGERY      Social History   Socioeconomic History   Marital status: Married    Spouse name: Not on file   Number of children: 3   Years of education: Not on file   Highest education level: Not on file  Occupational History   Not on file  Social Needs   Financial resource strain: Not on file   Food insecurity    Worry: Not on file    Inability: Not on file   Transportation needs    Medical: Not on file    Non-medical: Not  on file  Tobacco Use   Smoking status: Never Smoker   Smokeless tobacco: Never Used  Substance and Sexual Activity   Alcohol use: No   Drug use: No   Sexual activity: Not Currently  Lifestyle   Physical activity    Days per week: Not on file    Minutes per session: Not on file   Stress: Not on file  Relationships   Social connections    Talks on phone: Not on file    Gets together: Not on file    Attends religious service: Not on file    Active member of club or organization: Not on file    Attends meetings of clubs or organizations: Not on file    Relationship status: Not on file   Intimate partner violence    Fear of current or ex partner: Not on file    Emotionally abused: Not on file    Physically abused: Not on file    Forced sexual activity: Not on file  Other Topics Concern   Not on file  Social History Narrative   Not on file     Allergies  Allergen Reactions   Ace Inhibitors     hyperkalemia   Losartan     hyperkalemia   Codeine Nausea Only   Tramadol Nausea Only     Immunization History  Administered Date(s) Administered   Influenza,inj,quad, With Preservative 11/02/2017    Outpatient Medications Prior to Visit  Medication Sig Dispense Refill   acetaminophen (TYLENOL) 500 MG tablet Take 1,000  mg by mouth 2 (two) times daily as needed for mild pain. For pain      dorzolamide (TRUSOPT) 2 % ophthalmic solution Place 1 drop into the right eye 2 (two) times daily.     ferrous sulfate 325 (65 FE) MG EC tablet Take 1 tablet (325 mg total) by mouth 3 (three) times daily with meals. 270 tablet 1   furosemide (LASIX) 20 MG tablet Take 20 mg by mouth every other day.     LORazepam (ATIVAN) 0.5 MG tablet Take 1 tablet (0.5 mg total) by mouth at bedtime. 30 tablet 2   Multiple Vitamin (MULTIVITAMIN WITH MINERALS) TABS Take 1 tablet by mouth daily.     omeprazole (PRILOSEC) 20 MG capsule Take 20 mg by mouth daily.     warfarin (COUMADIN) 5 MG tablet TAKE 1/2 TO 1 TABLET DAILY AS DIRECTED BY ANTICOAGULATION CLINIC (Patient taking differently: Take 2.5-5 mg by mouth See admin instructions. Take 2.'5mg'$  daily everyday except on mondays take '5mg'$  daily) 90 tablet 1   No facility-administered medications prior to visit.     Review of Systems  Constitutional: Positive for malaise/fatigue. Negative for chills, fever and weight loss.  HENT: Negative for congestion and sore throat.        No postnasal drip  Eyes: Negative.   Respiratory: Positive for cough and shortness of breath. Negative for wheezing.   Cardiovascular: Positive for leg swelling. Negative for chest pain and palpitations.  Gastrointestinal: Positive for blood in stool, diarrhea and heartburn. Negative for melena, nausea and vomiting.  Genitourinary: Negative for dysuria and hematuria.       No vaginal bleeding  Musculoskeletal: Positive for back pain and joint pain. Negative for myalgias.       No synovitis  Skin: Positive for itching and rash.  Neurological: Negative for focal weakness and headaches.       No proximal weakness involving shoulders or hips     Objective:  Vitals:   07/13/19 1401  BP: 124/78  Pulse: 60  Temp: (!) 97.1 F (36.2 C)  TempSrc: Temporal  SpO2: 96%  Weight: 186 lb 3.2 oz (84.5 kg)  Height:  '5\' 5"'$  (1.651 m)   96% on RA BMI Readings from Last 3 Encounters:  07/13/19 30.99 kg/m  07/07/19 30.60 kg/m  06/26/19 30.79 kg/m   Wt Readings from Last 3 Encounters:  07/13/19 186 lb 3.2 oz (84.5 kg)  07/07/19 183 lb 14.2 oz (83.4 kg)  06/26/19 185 lb (83.9 kg)    Physical Exam Vitals signs reviewed.  Constitutional:      Appearance: Normal appearance. She is obese. She is not ill-appearing.  HENT:     Head: Normocephalic and atraumatic.     Nose:     Comments: Deferred due to masking requirement.    Mouth/Throat:     Comments: Deferred due to masking requirement. Eyes:     General: No scleral icterus. Neck:     Musculoskeletal: Neck supple.  Cardiovascular:     Rate and Rhythm: Normal rate and regular rhythm.     Heart sounds: No murmur.  Pulmonary:     Comments: Breathing comfortably on room air, no tachypnea or conversational dyspnea.  Frequent throat clearing, but no cough. Abdominal:     General: There is no distension.     Palpations: Abdomen is soft.     Tenderness: There is no abdominal tenderness.  Musculoskeletal:        General: Swelling present.  Lymphadenopathy:     Cervical: No cervical adenopathy.  Skin:    General: Skin is warm and dry.     Coloration: Skin is pale.     Comments: No rash on face, hands, back, or arms  Neurological:     General: No focal deficit present.     Mental Status: She is alert.     Motor: No weakness.     Coordination: Coordination normal.  Psychiatric:        Mood and Affect: Mood normal.        Thought Content: Thought content normal.      CBC    Component Value Date/Time   WBC 8.2 06/29/2019 1427   WBC 7.8 05/27/2019 0004   RBC 3.23 (L) 06/29/2019 1427   RBC 3.26 (L) 05/27/2019 0004   HGB 9.5 (L) 06/29/2019 1427   HCT 29.7 (L) 06/29/2019 1427   PLT 299 06/29/2019 1427   MCV 92 06/29/2019 1427   MCH 29.4 06/29/2019 1427   MCH 30.7 05/27/2019 0004   MCHC 32.0 06/29/2019 1427   MCHC 31.5 05/27/2019 0004    RDW 14.0 06/29/2019 1427   LYMPHSABS 1.2 04/13/2019 1600   MONOABS 0.6 04/13/2019 1600   EOSABS 0.2 04/13/2019 1600   BASOSABS 0.0 04/13/2019 1600    CHEMISTRY Recent Labs  Lab 07/07/19 1421  NA 142  K 4.9  CL 106  CO2 22  GLUCOSE 107*  BUN 27  CREATININE 1.30*  CALCIUM 10.8*   Estimated Creatinine Clearance: 34.6 mL/min (A) (by C-G formula based on SCr of 1.3 mg/dL (H)).   Chest Imaging- films reviewed: CT chest 07/06/2019- mild pleural thickening with trivial dependent bilateral pleural effusions, 3 mm nodule in the lateral right middle lobe, scarring along lateral right major fissure.  Minimal groundglass with intralobular septal thickening in the most dependent regions of bilateral lower lobes.  No honeycombing or traction bronchiectasis.  Expiratory images without significant mosaicism to suggest regional air trapping.  Pulmonary Functions Testing Results: No flowsheet data found. 2010 spirometry and DLCO No obstruction.  Elevated FVC and FEV1.  DLCO 50% of predicted, corrected for hemoglobin.  Uninterpretable flow volume loop. No other PFTs available in the media section.  Echocardiogram 05/26/2019: LVEF 30 to 35%, diffuse LV hypokinesis, moderately dilated left atrium.  Normal RV size and function.  Mildly dilated right atrium.  Moderate MR, mild AR without AS.  SPECT Myoperfusion scan 12/31/2015: Normal rest and stress perfusion-low risk study.  No exercise related arrhythmias or ischemic EKG changes.    Assessment & Plan:     ICD-10-CM   1. Cough  R05 Pulmonary function test    fluticasone (FLONASE) 50 MCG/ACT nasal spray    CT Chest High Resolution  2. DOE (dyspnea on exertion)  R06.09 Pulmonary function test    fluticasone (FLONASE) 50 MCG/ACT nasal spray    CT Chest High Resolution  3. Abnormal CT of the chest  R93.89 CT Chest High Resolution    Abnormal CT chest- in the dependent regions of the lungs it is difficult to determine if this is due to  atelectasis or interstitial lung disease.  With her history of symptoms and location, obviously ILD is a concern.  Her history of amiodarone use chronically, especially stopped due to not known problem, raises significant concern for a history of amiodarone- induced lung toxicity.  IPF and chronic GERD related fibrosis are also possible in this distribution.  Anemia could certainly exacerbate symptoms related to any chronic cardiac or lung condition, and probably contributes. - Repeat high-resolution CT with prone and supine images to better examine her lung bases in 3 months. -PFTs -Would like to obtain records of previous PFTs from [facility] if possible. -Continue PPI daily - I explained to the patient and her husband that although she has significant symptoms, without additional work-up there is no specific treatment for her dyspnea at this time.  The best approach would be to treat her anemia and proceed with work-up. -Patient and her husband declined flu shot today due to her feeling poorly chronically.  She plans to get one in a few weeks.  Cough-concern for postnasal drip causing upper airway cough syndrome given frequent throat clearing -Trial of flonase, 2 sprays bilaterally once daily until follow-up  Dyspnea on exertion- previous cardiac work-up unrevealing; by history it seems that she has symptoms that relate temporally to cardiac interventions -Appreciate cardiology's thoroughness and input -Needs a thorough pulmonary evaluation - Encouraged regular physical activity as tolerated  Iron deficiency anemia could certainly exacerbate symptoms related to any chronic cardiac or lung condition and probably contributes. -Agree with iron repletion and rechecking iron stores -Given her new development of anemia over the last year or so while on warfarin, this warrants evaluation for sources of occult blood loss- defer to PCP.    RTC in 3 months.   Current Outpatient Medications:     acetaminophen (TYLENOL) 500 MG tablet, Take 1,000 mg by mouth 2 (two) times daily as needed for mild pain. For pain , Disp: , Rfl:    dorzolamide (TRUSOPT) 2 % ophthalmic solution, Place 1 drop into the right eye 2 (two) times daily., Disp: , Rfl:    ferrous sulfate 325 (65 FE) MG EC tablet, Take 1 tablet (325 mg total) by mouth 3 (three) times daily with meals., Disp: 270 tablet, Rfl: 1   furosemide (LASIX) 20 MG tablet, Take 20 mg by mouth every other day., Disp: , Rfl:  LORazepam (ATIVAN) 0.5 MG tablet, Take 1 tablet (0.5 mg total) by mouth at bedtime., Disp: 30 tablet, Rfl: 2   Multiple Vitamin (MULTIVITAMIN WITH MINERALS) TABS, Take 1 tablet by mouth daily., Disp: , Rfl:    omeprazole (PRILOSEC) 20 MG capsule, Take 20 mg by mouth daily., Disp: , Rfl:    warfarin (COUMADIN) 5 MG tablet, TAKE 1/2 TO 1 TABLET DAILY AS DIRECTED BY ANTICOAGULATION CLINIC (Patient taking differently: Take 2.5-5 mg by mouth See admin instructions. Take 2.'5mg'$  daily everyday except on mondays take '5mg'$  daily), Disp: 90 tablet, Rfl: 1   fluticasone (FLONASE) 50 MCG/ACT nasal spray, Place 2 sprays into both nostrils daily., Disp: 16 g, Rfl: 2   Julian Hy, DO [facility] 07/13/2019 5:51 PM

## 2019-07-13 NOTE — Patient Instructions (Addendum)
Thank you for visiting Dr. Carlis Abbott at [facility]. We recommend the following: Orders Placed This Encounter  Procedures  . Pulmonary function test   Orders Placed This Encounter  Procedures  . Pulmonary function test    Standing Status:   Future    Standing Expiration Date:   07/12/2020    Order Specific Question:   Where should this test be performed?    Answer:   [facility]    Order Specific Question:   Full PFT: includes the following: basic spirometry, spirometry pre & post bronchodilator, diffusion capacity (DLCO), lung volumes    Answer:   FULL PFT Without spirometry post bronchodilator    Order Specific Question:   Diffusion capacity (DLCO)    Answer:   Yes    Order Specific Question:   Lung volumes    Answer:   Yes    Meds ordered this encounter  Medications  . fluticasone (FLONASE) 50 MCG/ACT nasal spray    Sig: Place 2 sprays into both nostrils daily.    Dispense:  16 g    Refill:  2    Return in about 3 months (around 10/12/2019).    Please do your part to reduce the spread of COVID-19.

## 2019-07-18 DIAGNOSIS — L308 Other specified dermatitis: Secondary | ICD-10-CM | POA: Diagnosis not present

## 2019-07-18 DIAGNOSIS — L853 Xerosis cutis: Secondary | ICD-10-CM | POA: Diagnosis not present

## 2019-07-18 DIAGNOSIS — L565 Disseminated superficial actinic porokeratosis (DSAP): Secondary | ICD-10-CM | POA: Diagnosis not present

## 2019-07-18 DIAGNOSIS — Z85828 Personal history of other malignant neoplasm of skin: Secondary | ICD-10-CM | POA: Diagnosis not present

## 2019-07-19 ENCOUNTER — Other Ambulatory Visit: Payer: Medicare Other

## 2019-07-20 DIAGNOSIS — G8929 Other chronic pain: Secondary | ICD-10-CM | POA: Diagnosis not present

## 2019-07-20 DIAGNOSIS — I130 Hypertensive heart and chronic kidney disease with heart failure and stage 1 through stage 4 chronic kidney disease, or unspecified chronic kidney disease: Secondary | ICD-10-CM | POA: Diagnosis not present

## 2019-07-20 DIAGNOSIS — I4819 Other persistent atrial fibrillation: Secondary | ICD-10-CM | POA: Diagnosis not present

## 2019-07-20 DIAGNOSIS — I5043 Acute on chronic combined systolic (congestive) and diastolic (congestive) heart failure: Secondary | ICD-10-CM | POA: Diagnosis not present

## 2019-07-20 DIAGNOSIS — M545 Low back pain: Secondary | ICD-10-CM | POA: Diagnosis not present

## 2019-07-20 DIAGNOSIS — N183 Chronic kidney disease, stage 3 (moderate): Secondary | ICD-10-CM | POA: Diagnosis not present

## 2019-07-24 ENCOUNTER — Telehealth: Payer: Self-pay | Admitting: Cardiology

## 2019-07-24 NOTE — Telephone Encounter (Signed)
Patient called and stated that she feels like she is getting worse every day since her ablation. She stated that it was on 05/29/2019. She stated that she stays tired, not sleeping good, no appetite, dizzy, shortness of breath overall does not feel well and has no energy. I instructed pt to send a manual transmission w/ her home monitor. Pt verbalized understanding.

## 2019-07-24 NOTE — Telephone Encounter (Signed)
Transmission received and reviewed. Presenting rhythm VP @ 60bpm w/PVCs (trigeminal and quadrigeminal). Histograms suggest increasing PVC burden since last check, VP now 86%. Patient was VP @ 30bpm at visit on 06/29/19, VP 90% since 06/15/19.  Spoke with patient. She continues to feel weak and fatigued, feels it is worse since her AV nodal ablation. She has barely been able to get out of the chair today due to weakness and SOB with activity. Symptoms thought to be multifactorial per recent provider notes in Epic--pt saw D. Dunn, PA, on 07/07/19, appeared euvolemic at that visit per notes. Saw pulmonologist on 07/13/19 to establish care. Recently started on iron supplementation (~2 weeks ago) for anemia, no improvement in symptoms. Pt requesting recommendations from Dr. [name]. She asks if PPM base rate can be increased (currently programmed VVIR @ 60bpm). Advised I will call back with an update tomorrow afternoon. Pt in agreement with plan, no further questions at this time.    Presenting rhythm:

## 2019-07-25 NOTE — Telephone Encounter (Signed)
Spoke with patient. She is agreeable to a DC appointment tomorrow at 3:00pm for base rate reprogramming. Aware to wear her own mask and come by herself to the appointment. No further questions at this time.

## 2019-07-25 NOTE — Telephone Encounter (Signed)
Lets increase her VR from 60 to 70/min. GT

## 2019-07-26 ENCOUNTER — Ambulatory Visit (INDEPENDENT_AMBULATORY_CARE_PROVIDER_SITE_OTHER): Payer: Medicare Other | Admitting: *Deleted

## 2019-07-26 ENCOUNTER — Other Ambulatory Visit: Payer: Self-pay

## 2019-07-26 ENCOUNTER — Ambulatory Visit: Payer: Medicare Other | Admitting: Cardiology

## 2019-07-26 DIAGNOSIS — I495 Sick sinus syndrome: Secondary | ICD-10-CM | POA: Diagnosis not present

## 2019-07-26 DIAGNOSIS — Z950 Presence of cardiac pacemaker: Secondary | ICD-10-CM | POA: Diagnosis not present

## 2019-07-27 DIAGNOSIS — I4819 Other persistent atrial fibrillation: Secondary | ICD-10-CM | POA: Diagnosis not present

## 2019-07-27 DIAGNOSIS — N183 Chronic kidney disease, stage 3 (moderate): Secondary | ICD-10-CM | POA: Diagnosis not present

## 2019-07-27 DIAGNOSIS — G8929 Other chronic pain: Secondary | ICD-10-CM | POA: Diagnosis not present

## 2019-07-27 DIAGNOSIS — I5043 Acute on chronic combined systolic (congestive) and diastolic (congestive) heart failure: Secondary | ICD-10-CM | POA: Diagnosis not present

## 2019-07-27 DIAGNOSIS — M545 Low back pain: Secondary | ICD-10-CM | POA: Diagnosis not present

## 2019-07-27 DIAGNOSIS — I130 Hypertensive heart and chronic kidney disease with heart failure and stage 1 through stage 4 chronic kidney disease, or unspecified chronic kidney disease: Secondary | ICD-10-CM | POA: Diagnosis not present

## 2019-07-28 LAB — CUP PACEART INCLINIC DEVICE CHECK
Brady Statistic RV Percent Paced: 86 %
Date Time Interrogation Session: 20200930040000
Implantable Lead Implant Date: 20140508
Implantable Lead Implant Date: 20140508
Implantable Lead Location: 753859
Implantable Lead Location: 753860
Implantable Lead Model: 4135
Implantable Lead Model: 4136
Implantable Lead Serial Number: 29333020
Implantable Lead Serial Number: 29379474
Implantable Pulse Generator Implant Date: 20140508
Lead Channel Setting Pacing Amplitude: 2.4 V
Lead Channel Setting Pacing Pulse Width: 0.4 ms
Lead Channel Setting Sensing Sensitivity: 2.5 mV
Pulse Gen Serial Number: 112392

## 2019-07-28 NOTE — Progress Notes (Signed)
Pacemaker check in clinic, added-on for base rate reprogramming per Dr. [name]. No testing performed today. 1 NSVT episode recorded, 5 beats duration. Base rate increased to 70bpm per Dr. [name]. Estimated longevity 7.5 years. Patient enrolled in remote follow-up. Patient education completed. Patient to call prior to upcoming appointment with Dr. Radford Pax if symptoms are not improved. ROV with Dr. [name] on 09/13/19.

## 2019-08-02 ENCOUNTER — Telehealth: Payer: Self-pay | Admitting: Family Medicine

## 2019-08-02 DIAGNOSIS — I130 Hypertensive heart and chronic kidney disease with heart failure and stage 1 through stage 4 chronic kidney disease, or unspecified chronic kidney disease: Secondary | ICD-10-CM | POA: Diagnosis not present

## 2019-08-02 DIAGNOSIS — I5043 Acute on chronic combined systolic (congestive) and diastolic (congestive) heart failure: Secondary | ICD-10-CM | POA: Diagnosis not present

## 2019-08-02 DIAGNOSIS — G8929 Other chronic pain: Secondary | ICD-10-CM | POA: Diagnosis not present

## 2019-08-02 DIAGNOSIS — M545 Low back pain: Secondary | ICD-10-CM | POA: Diagnosis not present

## 2019-08-02 DIAGNOSIS — N183 Chronic kidney disease, stage 3 (moderate): Secondary | ICD-10-CM | POA: Diagnosis not present

## 2019-08-02 DIAGNOSIS — I4819 Other persistent atrial fibrillation: Secondary | ICD-10-CM | POA: Diagnosis not present

## 2019-08-02 NOTE — Telephone Encounter (Signed)
Left message for Anda Kraft with [facility] to return call to office for verbal orders.

## 2019-08-02 NOTE — Telephone Encounter (Signed)
Home Health Verbal Orders - Caller/Agency: [facility] Number: [phone] Requesting PT Frequency: 1x a week for 4 weeks  2x a week for 4 weeks  1x a week for 1 week

## 2019-08-04 DIAGNOSIS — Z89421 Acquired absence of other right toe(s): Secondary | ICD-10-CM | POA: Diagnosis not present

## 2019-08-04 DIAGNOSIS — Z7901 Long term (current) use of anticoagulants: Secondary | ICD-10-CM | POA: Diagnosis not present

## 2019-08-04 DIAGNOSIS — I420 Dilated cardiomyopathy: Secondary | ICD-10-CM | POA: Diagnosis not present

## 2019-08-04 DIAGNOSIS — M545 Low back pain: Secondary | ICD-10-CM | POA: Diagnosis not present

## 2019-08-04 DIAGNOSIS — N183 Chronic kidney disease, stage 3 unspecified: Secondary | ICD-10-CM | POA: Diagnosis not present

## 2019-08-04 DIAGNOSIS — G8929 Other chronic pain: Secondary | ICD-10-CM | POA: Diagnosis not present

## 2019-08-04 DIAGNOSIS — F419 Anxiety disorder, unspecified: Secondary | ICD-10-CM | POA: Diagnosis not present

## 2019-08-04 DIAGNOSIS — I080 Rheumatic disorders of both mitral and aortic valves: Secondary | ICD-10-CM | POA: Diagnosis not present

## 2019-08-04 DIAGNOSIS — I130 Hypertensive heart and chronic kidney disease with heart failure and stage 1 through stage 4 chronic kidney disease, or unspecified chronic kidney disease: Secondary | ICD-10-CM | POA: Diagnosis not present

## 2019-08-04 DIAGNOSIS — I495 Sick sinus syndrome: Secondary | ICD-10-CM | POA: Diagnosis not present

## 2019-08-04 DIAGNOSIS — Z950 Presence of cardiac pacemaker: Secondary | ICD-10-CM | POA: Diagnosis not present

## 2019-08-04 DIAGNOSIS — Z85828 Personal history of other malignant neoplasm of skin: Secondary | ICD-10-CM | POA: Diagnosis not present

## 2019-08-04 DIAGNOSIS — K219 Gastro-esophageal reflux disease without esophagitis: Secondary | ICD-10-CM | POA: Diagnosis not present

## 2019-08-04 DIAGNOSIS — I5043 Acute on chronic combined systolic (congestive) and diastolic (congestive) heart failure: Secondary | ICD-10-CM | POA: Diagnosis not present

## 2019-08-04 DIAGNOSIS — M47815 Spondylosis without myelopathy or radiculopathy, thoracolumbar region: Secondary | ICD-10-CM | POA: Diagnosis not present

## 2019-08-04 DIAGNOSIS — E785 Hyperlipidemia, unspecified: Secondary | ICD-10-CM | POA: Diagnosis not present

## 2019-08-04 DIAGNOSIS — I4819 Other persistent atrial fibrillation: Secondary | ICD-10-CM | POA: Diagnosis not present

## 2019-08-07 ENCOUNTER — Telehealth: Payer: Self-pay | Admitting: Internal Medicine

## 2019-08-07 NOTE — Telephone Encounter (Signed)
Spoke with patient. She reports she continues to have a decreased energy since her AV node ablation 3 weeks ago. She has appointment with Dr Radford Pax tomorrow at 2 pm and requested to be seen in device clinic after her appointment. Requested that she send remote transmission. Reviewed transmission and informed patient she will not need to be seen by DC tomorrow.  Transmission showed device function WNL. No events & no PVCs  recorded.  VP 79%.

## 2019-08-07 NOTE — Telephone Encounter (Signed)
Left message for Molly [name] with verbal orders as requested for patient. Advised to call office with any questions or concerns.

## 2019-08-07 NOTE — Telephone Encounter (Signed)
New Message  Patient states that she is having issues with her device and not having energy. Patient is wanting to speak with someone in the device clinic, called DC, didn't get an answer. Please give patient call back to discuss.

## 2019-08-08 ENCOUNTER — Encounter: Payer: Self-pay | Admitting: Cardiology

## 2019-08-08 ENCOUNTER — Encounter: Payer: Self-pay | Admitting: *Deleted

## 2019-08-08 ENCOUNTER — Ambulatory Visit (INDEPENDENT_AMBULATORY_CARE_PROVIDER_SITE_OTHER): Payer: Medicare Other | Admitting: Cardiology

## 2019-08-08 ENCOUNTER — Other Ambulatory Visit: Payer: Self-pay

## 2019-08-08 VITALS — BP 130/68 | HR 75 | Ht 65.0 in | Wt 185.0 lb

## 2019-08-08 DIAGNOSIS — I340 Nonrheumatic mitral (valve) insufficiency: Secondary | ICD-10-CM | POA: Diagnosis not present

## 2019-08-08 DIAGNOSIS — I5042 Chronic combined systolic (congestive) and diastolic (congestive) heart failure: Secondary | ICD-10-CM

## 2019-08-08 DIAGNOSIS — I495 Sick sinus syndrome: Secondary | ICD-10-CM | POA: Diagnosis not present

## 2019-08-08 DIAGNOSIS — I4819 Other persistent atrial fibrillation: Secondary | ICD-10-CM | POA: Diagnosis not present

## 2019-08-08 DIAGNOSIS — I420 Dilated cardiomyopathy: Secondary | ICD-10-CM

## 2019-08-08 DIAGNOSIS — I351 Nonrheumatic aortic (valve) insufficiency: Secondary | ICD-10-CM

## 2019-08-08 DIAGNOSIS — E7800 Pure hypercholesterolemia, unspecified: Secondary | ICD-10-CM | POA: Diagnosis not present

## 2019-08-08 DIAGNOSIS — I10 Essential (primary) hypertension: Secondary | ICD-10-CM | POA: Diagnosis not present

## 2019-08-08 DIAGNOSIS — N183 Chronic kidney disease, stage 3 unspecified: Secondary | ICD-10-CM

## 2019-08-08 DIAGNOSIS — I493 Ventricular premature depolarization: Secondary | ICD-10-CM

## 2019-08-08 NOTE — Progress Notes (Signed)
Cardiology Office Note:    Date:  08/14/2019   ID:  Molly [name], DOB Oct 24, 1933, MRN [id number]  PCP:  Martinique, Betty G, MD  Cardiologist:  Fransico Him, MD    Referring MD: Martinique, Betty G, MD   Chief Complaint  Patient presents with  . Congestive Heart Failure  . Hypertension  . Hyperlipidemia  . Cardiomyopathy  . Atrial Fibrillation    History of Present Illness:    Molly [name] is a 85 y.o. female with a hx of history of chronic combined systolic/diastolic CHF (presumed NICM),aortic insufficiency, mitral regurgitation, hyperlipidemia, hypertension, tachy-brady syndrome s/p PPM implant 2014, persistent atrial fibrillation s/p recent AVN ablation, CKD stage III by labs, hyperparathyroidism (mananged conservatively), PVCs.    She has long history of atrial fib going back many years and required PPM as above. In 2017 her echo showed reduction in EF to 40-45% with mild AI and mild-moderate MR, so nuclear stress test was pursued which showed no ischemia. Most recent echocardiogram in 05/26/19 showed further decline in EF to 30-35%, normal RVSP, moderate LAE, mild RAE, moderate mitral regurgitation, mild aortic regurgitation.Her afib has been difficult to control and she has been followed by EP and the afib clinic. She failed amiodarone, Tikosyn and sotalol in the past. She was not felt to be a good candidate for afib ablation given her severely dilated LA. She was then admitted 05/25/19 with worsening fatigue, dyspnea, and weight gain with mildly elevated BNP and uncontrolled atrial arrhythmias. She was treated with IV Lasix and underwent AV node ablation 05/29/19 and therefore HR is now dictated by her pacemaker. Her Lasix was stopped due to AKI. She has history of hyperkalemia with ACEI, ARB, and spironolactone and blood pressure has otherwise been prohibitive of aggressive med titration.   Despite cardiac measures undertaken above, she had not really felt better and has  complained of chronic generalized fatigue, dyspnea on exertion, poor appetite. Labs 06/29/19 showed elevated ESR 76, normal CRP, TSH 5.120, PBNP 3228, Hgb 9.5, normal ferritin, severely low iron saturation at 7% with low iron level, normal TIBC, normal B12/folate, Cr 1.14, calcium 10.6.  Carvedilol was stopped to allow blood pressure room for this change. High resolution CT done for chronic cough and fatigue showed early/mild interstitial lung disease with a spectrum of findings considered indeterminate for usual interstitial pneumonia (UIP), trace bilateral pleural effusions vs pleural thickening, 14m RML nodule, aortic and coronary atherosclerosis. She was referred to pulmonary.  She is here today for followup.  She continues to feel poorly.  She saw her PCP recently for noncardiac issues stated above.  Her PCP was concerned that she may have an underlying malignancy due to iron def anemia and elevated ESR.  The patient refused any further workup for low Iron or elevated sed rate.  It was also recommended that she start an antidepressant med which she also refused.  Her PCP thought she should have a sleep study to make sure some of her excessive fatigue was not due to OSA and she refused sleep study as well.  She continues to complain of severe fatigue which she now blames on the AVN ablation/PPM but her Pacer has been adjusted several times with no improvement in sx.  She denies any CP, PND, orthopnea, LE edema or syncope.  She has chronic fatigue and occasional SOB.  Past Medical History:  Diagnosis Date  . Aortic insufficiency   . Atrial tachycardia ([place]   . Cancer ([facility]  Skin cancer- basal.  1 mylenoma  . Chronic combined systolic and diastolic CHF (congestive heart failure) ([place])   . CKD (chronic kidney disease), stage III   . Complication of anesthesia   . DCM (dilated cardiomyopathy) ([place])    EF 40-45% by echo 2017 - nuclear stress test with no ischemia  . GERD (gastroesophageal reflux  disease)   . H/O benign essential tremor    on amiodarone resolved off amio  . H/O cardiac radiofrequency ablation    a. AV node ablation in 05/2019.  Marland Kitchen H/O hyperkalemia    Resolved off ACE inhibitors  . High cholesterol   . History of blood transfusion   . Hyperkalemia   . Hyperparathyroidism ([place])   . Hypertension   . Mitral regurgitation   . Persistent atrial fibrillation ([place])   . PONV (postoperative nausea and vomiting)   . PVC's (premature ventricular contractions)   . Tachycardia-bradycardia syndrome [facility])    s/p PPM 02/2013    Past Surgical History:  Procedure Laterality Date  . AMPUTATION Right 11/27/2014   Procedure: RIGHT 2ND AND 3RD TOE AMPUTATION;  Surgeon: Wylene Simmer, MD;  Location: [place];  Service: Orthopedics;  Laterality: Right;  . ATRIAL FIBRILLATION ABLATION N/A 05/29/2019   Procedure: AVN ABLATION;  Surgeon: Evans Lance, MD;  Location: [place] CV LAB;  Service: Cardiovascular;  Laterality: N/A;  . BUNIONECTOMY     Right  . CARDIAC CATHETERIZATION  09/25/2000   EF of 50% -- with normal left ventricular size and function  . CARDIOVERSION N/A 12/28/2017   Procedure: CARDIOVERSION;  Surgeon: Dorothy Spark, MD;  Location[facility] ENDOSCOPY;  Service: Cardiovascular;  Laterality: N/A;  . CARDIOVERSION N/A 02/25/2018   Procedure: CARDIOVERSION;  Surgeon: Skeet Latch, MD;  Location: [place];  Service: Cardiovascular;  Laterality: N/A;  . CESAREAN SECTION    . CESAREAN SECTION    . EYE SURGERY Bilateral    Cataract  . FOOT SURGERY    . INSERT / REPLACE / REMOVE PACEMAKER  02/2013  . PACEMAKER INSERTION  02/2013  . PERMANENT PACEMAKER INSERTION N/A 03/02/2013   Procedure: PERMANENT PACEMAKER INSERTION;  Surgeon: Evans Lance, MD;  Location: [facility] CATH LAB;  Service: Cardiovascular;  Laterality: N/A;  . SHOULDER ARTHROSCOPY W/ ROTATOR CUFF REPAIR    . SHOULDER SURGERY Right    roto cuff  . TOE AMPUTATION Left    2nd and 3rd, bunion under toes.  .  TONSILLECTOMY    . VARICOSE VEIN SURGERY      Current Medications: Current Meds  Medication Sig  . acetaminophen (TYLENOL) 500 MG tablet Take 1,000 mg by mouth 2 (two) times daily as needed for mild pain. For pain   . dorzolamide (TRUSOPT) 2 % ophthalmic solution Place 1 drop into the right eye 2 (two) times daily.  . ferrous sulfate 325 (65 FE) MG EC tablet Take 1 tablet (325 mg total) by mouth 3 (three) times daily with meals.  . fluticasone (FLONASE) 50 MCG/ACT nasal spray Place 2 sprays into both nostrils daily.  . furosemide (LASIX) 20 MG tablet Take 20 mg by mouth every other day.  Marland Kitchen LORazepam (ATIVAN) 0.5 MG tablet Take 1 tablet (0.5 mg total) by mouth at bedtime.  . Multiple Vitamin (MULTIVITAMIN WITH MINERALS) TABS Take 1 tablet by mouth daily.  Marland Kitchen omeprazole (PRILOSEC) 20 MG capsule Take 20 mg by mouth daily.  Marland Kitchen warfarin (COUMADIN) 5 MG tablet TAKE 1/2 TO 1 TABLET DAILY AS DIRECTED BY ANTICOAGULATION CLINIC (  Patient taking differently: Take 2.5-5 mg by mouth See admin instructions. Take 2.'5mg'$  daily everyday except on mondays take '5mg'$  daily)     Allergies:   Ace inhibitors, Losartan, Codeine, and Tramadol   Social History   Socioeconomic History  . Marital status: Married    Spouse name: Not on file  . Number of children: 3  . Years of education: Not on file  . Highest education level: Not on file  Occupational History  . Not on file  Social Needs  . Financial resource strain: Not on file  . Food insecurity    Worry: Not on file    Inability: Not on file  . Transportation needs    Medical: Not on file    Non-medical: Not on file  Tobacco Use  . Smoking status: Never Smoker  . Smokeless tobacco: Never Used  Substance and Sexual Activity  . Alcohol use: No  . Drug use: No  . Sexual activity: Not Currently  Lifestyle  . Physical activity    Days per week: Not on file    Minutes per session: Not on file  . Stress: Not on file  Relationships  . Social Product manager on phone: Not on file    Gets together: Not on file    Attends religious service: Not on file    Active member of club or organization: Not on file    Attends meetings of clubs or organizations: Not on file    Relationship status: Not on file  Other Topics Concern  . Not on file  Social History Narrative  . Not on file     Family History: The patient's family history includes Breast cancer in her sister; Hypertension in her father and mother.  ROS:   Please see the history of present illness.    ROS  All other systems reviewed and negative.   EKGs/Labs/Other Studies Reviewed:    The following studies were reviewed today: none  EKG:  EKG is not ordered today.    Recent Labs: 05/25/2019: B Natriuretic Peptide 543.7 05/28/2019: Magnesium 1.8 06/29/2019: Hemoglobin 9.5; NT-Pro BNP 3,228; Platelets 299; TSH 5.120 07/07/2019: BUN 27; Creatinine, Ser 1.30; Potassium 4.9; Sodium 142   Recent Lipid Panel    Component Value Date/Time   CHOL 156 05/23/2012 0514   TRIG 86 05/23/2012 0514   HDL 80 05/23/2012 0514   CHOLHDL 2.0 05/23/2012 0514   VLDL 17 05/23/2012 0514   LDLCALC 59 05/23/2012 0514    Physical Exam:    VS:  BP 130/68   Pulse 75   Ht '5\' 5"'$  (1.651 m)   Wt 185 lb (83.9 kg)   SpO2 99%   BMI 30.79 kg/m     Wt Readings from Last 3 Encounters:  08/08/19 185 lb (83.9 kg)  07/13/19 186 lb 3.2 oz (84.5 kg)  07/07/19 183 lb 14.2 oz (83.4 kg)     GEN:  Well nourished, well developed in no acute distress HEENT: Normal NECK: No JVD; No carotid bruits LYMPHATICS: No lymphadenopathy CARDIAC: RRR, no murmurs, rubs, gallops RESPIRATORY:  Clear to auscultation without rales, wheezing or rhonchi  ABDOMEN: Soft, non-tender, non-distended MUSCULOSKELETAL:  No edema; No deformity  SKIN: Warm and dry NEUROLOGIC:  Alert and oriented x 3 PSYCHIATRIC:  Normal affect   ASSESSMENT:    1. Chronic combined systolic and diastolic heart failure ([place])   2. Aortic valve  insufficiency, etiology of cardiac valve disease unspecified   3. Mitral valve  insufficiency, unspecified etiology   4. Essential hypertension   5. Pure hypercholesterolemia   6. Tachycardia-bradycardia syndrome ([place])   7. DCM (dilated cardiomyopathy) ([place])   8. PVC (premature ventricular contraction)   9. Stage 3 chronic kidney disease, unspecified whether stage 3a or 3b CKD   10. Persistent atrial fibrillation ([facility])    PLAN:    In order of problems listed above:  1.  Chronic combined systolic/diastolic CHF -she appears euvolemic on exam today although still complains of exertional fatigue -continue Lasix '20mg'$  qod  2.  Aortic insufficiency -mild by echo 04/2019  3.  Mitral regurgitation -moderate by echo 04/2019 -repeat echo in 1 year  4.  HTN -BP controlled on exam -she is not on any BP lowering meds  5.  HLD -followed by PCP  6.  Tachy-brady syndrome -s/p AVN ablation with PPM  7.  DCM -unclear etiology -EF 30-35% on echo 04/2019 -she is intolerant to ACE I and ARBs and BB have caused severe fatigue in the past  8.  PVCs -still has intermittent palpitations but much better  9.  Stage 3 CKD -followed by PCP  10.  Persistent atrial fibrillation -now permanent and s/p AVN ablation -PPM dependent now -continue warfarin -followed in device clinic  11.  Persistent chronic fatigue -I am not convinced that this is related to cardiac etiology -her ESR is elevated as well as iron def anemia -she has refused further workup for occult malignancy as recommended by her PCP -she has refused sleep study which I think is a good idea -she also has refused a trial of antidepressant therapy -I explained to her that if she does not allow workup of abnormal labs we will likely not find out the etiology of her underlying worsening fatigue.  I do think she has some underlying depression as her fatigue has gone on for years -Her pacer has been adjusted several times and she is  seeing device clinic back next month -I will get a Lexsican myoview to rule out ischemia -I am going to refer her to GI for anemia workup - she has agreed -? Whether she would benefit from biV pacer upgrade as she is chronically paced and EF 30-35% - will leave up to EP   Medication Adjustments/Labs and Tests Ordered: Current medicines are reviewed at length with the patient today.  Concerns regarding medicines are outlined above.  Orders Placed This Encounter  Procedures  . Ambulatory referral to Gastroenterology  . MYOCARDIAL PERFUSION IMAGING   No orders of the defined types were placed in this encounter.   Signed, Fransico Him, MD  08/14/2019 9:54 AM    [place]

## 2019-08-08 NOTE — Patient Instructions (Signed)
Medication Instructions:  No changes If you need a refill on your cardiac medications before your next appointment, please call your pharmacy.   Lab work: none If you have labs (blood work) drawn today and your tests are completely normal, you will receive your results only by: Marland Kitchen MyChart Message (if you have MyChart) OR . A paper copy in the mail If you have any lab test that is abnormal or we need to change your treatment, we will call you to review the results.  Testing/Procedures: Your physician has requested that you have a lexiscan myoview. For further information please visit [email or web address]. Please follow instruction sheet, as given.   Follow-Up: At [facility], you and your health needs are our priority.  As part of our continuing mission to provide you with exceptional heart care, we have created designated Provider Care Teams.  These Care Teams include your primary Cardiologist (physician) and Advanced Practice Providers (APPs -  Physician Assistants and Nurse Practitioners) who all work together to provide you with the care you need, when you need it. You will need a follow up virtual appointment in 6 months with Molly [name] and in office appointment with Molly [name] in 12 months.  Please call our office 2 months in advance to schedule this appointment.   Any Other Special Instructions Will Be Listed Below (If Applicable).

## 2019-08-09 ENCOUNTER — Ambulatory Visit (INDEPENDENT_AMBULATORY_CARE_PROVIDER_SITE_OTHER): Payer: Medicare Other | Admitting: General Practice

## 2019-08-09 DIAGNOSIS — Z7901 Long term (current) use of anticoagulants: Secondary | ICD-10-CM

## 2019-08-09 LAB — POCT INR: INR: 2.6 (ref 2.0–3.0)

## 2019-08-09 NOTE — Patient Instructions (Addendum)
Pre visit review using our clinic review tool, if applicable. No additional management support is needed unless otherwise documented below in the visit note.  Continue to take 1/2 tablet daily except 1 tablet on Mondays only.  Re-check in 4 weeks.

## 2019-08-10 ENCOUNTER — Telehealth: Payer: Self-pay | Admitting: Family Medicine

## 2019-08-10 DIAGNOSIS — I130 Hypertensive heart and chronic kidney disease with heart failure and stage 1 through stage 4 chronic kidney disease, or unspecified chronic kidney disease: Secondary | ICD-10-CM | POA: Diagnosis not present

## 2019-08-10 DIAGNOSIS — I4819 Other persistent atrial fibrillation: Secondary | ICD-10-CM | POA: Diagnosis not present

## 2019-08-10 DIAGNOSIS — I495 Sick sinus syndrome: Secondary | ICD-10-CM | POA: Diagnosis not present

## 2019-08-10 DIAGNOSIS — N183 Chronic kidney disease, stage 3 unspecified: Secondary | ICD-10-CM | POA: Diagnosis not present

## 2019-08-10 DIAGNOSIS — I420 Dilated cardiomyopathy: Secondary | ICD-10-CM | POA: Diagnosis not present

## 2019-08-10 DIAGNOSIS — I5043 Acute on chronic combined systolic (congestive) and diastolic (congestive) heart failure: Secondary | ICD-10-CM | POA: Diagnosis not present

## 2019-08-10 NOTE — Telephone Encounter (Signed)
Pt would like to come in to the office and unfortunately her provider does not have anything.  Pt state that her Cardiologist is requesting her to see her provider as soon as possible to see where her blood is going she thinks that she is anemic and want the pt to be checked out.  Pt state that she has been coughing for 5 months and it isn't COVID related.  Pt insist on coming in the office is it okay for the pt to come in to see Dr. Elease Hashimoto on Monday with a cough that she stated is non-COVID related.  Pt would like to have a call to let her know if she needs to do a virtual or come in the office.  Can we bring the pt in to the office on Monday August 14, 2019 at 1:30 to see Dr. Elease Hashimoto?

## 2019-08-14 ENCOUNTER — Encounter: Payer: Self-pay | Admitting: Family Medicine

## 2019-08-14 ENCOUNTER — Ambulatory Visit (INDEPENDENT_AMBULATORY_CARE_PROVIDER_SITE_OTHER): Payer: Medicare Other | Admitting: Family Medicine

## 2019-08-14 ENCOUNTER — Telehealth: Payer: Self-pay | Admitting: Critical Care Medicine

## 2019-08-14 ENCOUNTER — Other Ambulatory Visit: Payer: Self-pay

## 2019-08-14 VITALS — BP 124/80 | HR 74 | Temp 96.3°F | Resp 12 | Ht 65.0 in | Wt 188.0 lb

## 2019-08-14 DIAGNOSIS — R05 Cough: Secondary | ICD-10-CM | POA: Diagnosis not present

## 2019-08-14 DIAGNOSIS — R0602 Shortness of breath: Secondary | ICD-10-CM | POA: Diagnosis not present

## 2019-08-14 DIAGNOSIS — R109 Unspecified abdominal pain: Secondary | ICD-10-CM | POA: Diagnosis not present

## 2019-08-14 DIAGNOSIS — R946 Abnormal results of thyroid function studies: Secondary | ICD-10-CM

## 2019-08-14 DIAGNOSIS — D509 Iron deficiency anemia, unspecified: Secondary | ICD-10-CM

## 2019-08-14 DIAGNOSIS — L299 Pruritus, unspecified: Secondary | ICD-10-CM | POA: Diagnosis not present

## 2019-08-14 DIAGNOSIS — R700 Elevated erythrocyte sedimentation rate: Secondary | ICD-10-CM

## 2019-08-14 DIAGNOSIS — R5383 Other fatigue: Secondary | ICD-10-CM | POA: Diagnosis not present

## 2019-08-14 DIAGNOSIS — R059 Cough, unspecified: Secondary | ICD-10-CM

## 2019-08-14 NOTE — Assessment & Plan Note (Signed)
This is also a chronic problem. Pending stress test. She is also having work up ordered by her pulmonologist. ? Deconditioning. Instructed about warning signs.

## 2019-08-14 NOTE — Patient Instructions (Signed)
A few things to remember from today's visit:   Fatigue, unspecified type  Iron deficiency anemia, unspecified iron deficiency anemia type  Pepcid 20 mg at bedtime. Continue Omeprazole 20 mg before breakfast.  Iron every other day with vit C. Abdominal CT will be arranged. Labs tomorrow at 10:20 am.  Please be sure medication list is accurate. If a new problem present, please set up appointment sooner than planned today.

## 2019-08-14 NOTE — Telephone Encounter (Signed)
Primary Pulmonologist: Dr. Carlis Abbott Last office visit and with whom: 07/13/19 with PC What do we see them for (pulmonary problems): cough  Last OV assessment/plan: Abnormal CT chest- in the dependent regions of the lungs it is difficult to determine if this is due to atelectasis or interstitial lung disease.  With her history of symptoms and location, obviously ILD is a concern.  Her history of amiodarone use chronically, especially stopped due to not known problem, raises significant concern for a history of amiodarone- induced lung toxicity.  IPF and chronic GERD related fibrosis are also possible in this distribution.  Anemia could certainly exacerbate symptoms related to any chronic cardiac or lung condition, and probably contributes. - Repeat high-resolution CT with prone and supine images to better examine her lung bases in 3 months. -PFTs -Would like to obtain records of previous PFTs from [facility] if possible. -Continue PPI daily - I explained to the patient and her husband that although she has significant symptoms, without additional work-up there is no specific treatment for her dyspnea at this time.  The best approach would be to treat her anemia and proceed with work-up. -Patient and her husband declined flu shot today due to her feeling poorly chronically.  She plans to get one in a few weeks.  Cough-concern for postnasal drip causing upper airway cough syndrome given frequent throat clearing -Trial of flonase, 2 sprays bilaterally once daily until follow-up  Dyspnea on exertion- previous cardiac work-up unrevealing; by history it seems that she has symptoms that relate temporally to cardiac interventions -Appreciate cardiology's thoroughness and input -Needs a thorough pulmonary evaluation - Encouraged regular physical activity as tolerated  Iron deficiency anemia could certainly exacerbate symptoms related to any chronic cardiac or lung condition and probably contributes. -Agree  with iron repletion and rechecking iron stores -Given her new development of anemia over the last year or so while on warfarin, this warrants evaluation for sources of occult blood loss- defer to PCP.    RTC in 3 months.  Was appointment offered to patient (explain)?  Patient has an appt this afternoon with her PCP Dr. Martin[place]. She wanted Dr. Ainsley Spinner recommendations as well.    Reason for call: Spoke with patient. She stated that her cough is not getting any better. She is starting to feel weak because of all of the cough. Non productive cough. Her cough tends to get worse at night when she sleeping on her side. She is still using her Flonase every day. Denied any fevers, body aches or chills. Also denied being around anyone with COVID.  She has an appt with her PCP this afternoon but wanted recommendations from Dr. Carlis Abbott.   Pharmacy is [facility] on [address].   Dr. Carlis Abbott, please advise. Thanks!

## 2019-08-14 NOTE — Progress Notes (Signed)
HPI:   [name] is a 85 y.o. female with history of atrial fibrillation, aortic insufficiency,generalized OA, CHF, and anxiety who is here today c/o fatigue and SOB when walking. She has had fatigue for a while bad seems to be worse after having cardiac ablation (05/29/2019), she was hospitalized for 5 days (from 05/25/2019 to 05/30/2019).  She has followed with cardiologist, Dr. Radford Pax. Pending a stress test next week.  Also evaluated by pulmonologist on 07/13/2019 due to chronic DOE and cough. She denies associated chest pain or palpitations.  Also complaining about cough for the past 5 to 6 months, it seems to be worse at night when laying down, also interfering with sleep.  She gets up a few times during the night to use the bathroom. No known OSA.  GERD, currently she is on omeprazole 20 mg daily. If she does not take omeprazole she will have heartburn.  Chest CT done, 07/06/19.  1. The appearance of the lungs may suggest very early or mild interstitial lung disease, with a spectrum of findings considered indeterminate for usual interstitial pneumonia (UIP) per current ATS guidelines. Primary differential considerations include nonspecific interstitial pneumonia (NSIP) versus early UIP.  2. Trace bilateral pleural effusions versus pleural thickening. 3. 3 mm right middle lobe pulmonary nodule, nonspecific, but statistically likely benign. Attention at time of repeat high-resolution chest CT is recommended to ensure the stability or resolution of this finding. 4. Aortic atherosclerosis, in addition to left main and 3 vessel coronary artery disease. 5. Mild cardiomegaly.   Hx of abnormal PFT's. Negative for tobacco use.  She is also complaining about intense generalized pruritus that interferes with his sleep. She has not noted a rash. She has not identified exacerbating or alleviating factors. She follows with dermatologist, Dr. Ronnald Ramp.  According to patient OTC  lotion were recommended to manage problem. She has not noted jaundice or stool/urine color changes.  Lab Results  Component Value Date   ALT 13 (L) 02/23/2017   AST 19 02/23/2017   ALKPHOS 43 02/23/2017   BILITOT 0.9 02/23/2017     Lab Results  Component Value Date   CREATININE 1.30 (H) 07/07/2019   BUN 27 07/07/2019   NA 142 07/07/2019   K 4.9 07/07/2019   CL 106 07/07/2019   CO2 22 07/07/2019   She has not taken ferrous sulfate for a few days and as instructed because because of diarrhea. She has not noted gross hematuria, blood in the stool, or melena.  Lab Results  Component Value Date   WBC 8.2 06/29/2019   HGB 9.5 (L) 06/29/2019   HCT 29.7 (L) 06/29/2019   MCV 92 06/29/2019   PLT 299 06/29/2019   Lab Results  Component Value Date   IRON 24 (L) 06/29/2019   TIBC 333 06/29/2019   FERRITIN 47 06/29/2019    She has had elevated Ca++ since 02/2018. Future labs placed last visit have not been done. Ca++ was 10.8 on 07/07/19. She wonders if this is causing her fatigue.  Lab Results  Component Value Date   ESRSEDRATE 30 (H) 06/29/2019   Lab Results  Component Value Date   CRP 10 06/29/2019   She is also complaining about hand "shaking" episodes, exacerbated by certain activities like using a fork but it does not happen all the time. She does not feel like this is related with anxiety. Negative for family history of essential tremor.  Lab Results  Component Value Date   TSH  5.120 (H) 06/29/2019     Review of Systems  Constitutional: Positive for activity change and appetite change. Negative for chills, fatigue, fever and unexpected weight change.  HENT: Negative for mouth sores, nosebleeds and trouble swallowing.   Eyes: Negative for redness and visual disturbance.  Respiratory: Negative for wheezing and stridor.   Cardiovascular: Negative for chest pain, palpitations and leg swelling.  Gastrointestinal: Negative for abdominal pain, nausea and vomiting.        Negative for changes in bowel habits.  Endocrine: Negative for cold intolerance, heat intolerance, polydipsia, polyphagia and polyuria.  Genitourinary: Negative for decreased urine volume, dysuria and hematuria.  Musculoskeletal: Positive for gait problem.  Skin: Negative for rash and wound.  Allergic/Immunologic: Positive for environmental allergies.  Neurological: Negative for syncope, facial asymmetry, weakness and headaches.  Psychiatric/Behavioral: Positive for sleep disturbance. Negative for confusion. The patient is nervous/anxious.   Rest see pertinent positives and negatives per HPI.   Current Outpatient Medications on File Prior to Visit  Medication Sig Dispense Refill  . acetaminophen (TYLENOL) 500 MG tablet Take 1,000 mg by mouth 2 (two) times daily as needed for mild pain. For pain     . dorzolamide (TRUSOPT) 2 % ophthalmic solution Place 1 drop into the right eye 2 (two) times daily.    . ferrous sulfate 325 (65 FE) MG EC tablet Take 1 tablet (325 mg total) by mouth 3 (three) times daily with meals. 270 tablet 1  . fluticasone (FLONASE) 50 MCG/ACT nasal spray Place 2 sprays into both nostrils daily. 16 g 2  . furosemide (LASIX) 20 MG tablet Take 20 mg by mouth every other day.    Marland Kitchen LORazepam (ATIVAN) 0.5 MG tablet Take 1 tablet (0.5 mg total) by mouth at bedtime. 30 tablet 2  . Multiple Vitamin (MULTIVITAMIN WITH MINERALS) TABS Take 1 tablet by mouth daily.    Marland Kitchen omeprazole (PRILOSEC) 20 MG capsule Take 20 mg by mouth daily.    Marland Kitchen warfarin (COUMADIN) 5 MG tablet TAKE 1/2 TO 1 TABLET DAILY AS DIRECTED BY ANTICOAGULATION CLINIC (Patient taking differently: Take 2.5-5 mg by mouth See admin instructions. Take 2.'5mg'$  daily everyday except on mondays take '5mg'$  daily) 90 tablet 1   No current facility-administered medications on file prior to visit.      Past Medical History:  Diagnosis Date  . Aortic insufficiency   . Atrial tachycardia ([place])   . Cancer ([place])    Skin cancer-  basal.  1 mylenoma  . Chronic combined systolic and diastolic CHF (congestive heart failure) ([place])   . CKD (chronic kidney disease), stage III   . Complication of anesthesia   . DCM (dilated cardiomyopathy) ([place])    EF 40-45% by echo 2017 - nuclear stress test with no ischemia  . GERD (gastroesophageal reflux disease)   . H/O benign essential tremor    on amiodarone resolved off amio  . H/O cardiac radiofrequency ablation    a. AV node ablation in 05/2019.  Marland Kitchen H/O hyperkalemia    Resolved off ACE inhibitors  . High cholesterol   . History of blood transfusion   . Hyperkalemia   . Hyperparathyroidism ([place])   . Hypertension   . Mitral regurgitation   . Persistent atrial fibrillation ([place])   . PONV (postoperative nausea and vomiting)   . PVC's (premature ventricular contractions)   . Tachycardia-bradycardia syndrome ([employer])    s/p PPM 02/2013   Allergies  Allergen Reactions  . Ace Inhibitors     hyperkalemia  .  Losartan     hyperkalemia  . Codeine Nausea Only  . Tramadol Nausea Only    Social History   Socioeconomic History  . Marital status: Married    Spouse name: Not on file  . Number of children: 3  . Years of education: Not on file  . Highest education level: Not on file  Occupational History  . Not on file  Social Needs  . Financial resource strain: Not on file  . Food insecurity    Worry: Not on file    Inability: Not on file  . Transportation needs    Medical: Not on file    Non-medical: Not on file  Tobacco Use  . Smoking status: Never Smoker  . Smokeless tobacco: Never Used  Substance and Sexual Activity  . Alcohol use: No  . Drug use: No  . Sexual activity: Not Currently  Lifestyle  . Physical activity    Days per week: Not on file    Minutes per session: Not on file  . Stress: Not on file  Relationships  . Social Herbalist on phone: Not on file    Gets together: Not on file    Attends religious service: Not on file    Active member  of club or organization: Not on file    Attends meetings of clubs or organizations: Not on file    Relationship status: Not on file  Other Topics Concern  . Not on file  Social History Narrative  . Not on file    Vitals:   08/14/19 1623  BP: 124/80  Pulse: 74  Resp: 12  Temp: (!) 96.3 F (35.7 C)  SpO2: 97%   Body mass index is 31.28 kg/m.   Physical Exam  Nursing note and vitals reviewed. Constitutional: She is oriented to person, place, and time. She appears well-developed. No distress.  HENT:  Head: Normocephalic and atraumatic.  Mouth/Throat: Oropharynx is clear and moist and mucous membranes are normal.  Eyes: Pupils are equal, round, and reactive to light. Conjunctivae are normal.  Cardiovascular: Normal rate. An irregular rhythm present.  No murmur heard. Respiratory: Effort normal and breath sounds normal. No respiratory distress.  GI: Soft. There is abdominal tenderness. There is no rebound and no guarding.    Musculoskeletal:        General: Edema (Trace pitting LE edema, bilateral.) present.  Lymphadenopathy:    She has no cervical adenopathy.  Neurological: She is alert and oriented to person, place, and time. She has normal strength. No cranial nerve deficit.  Unstable gait assisted with a walker.  Skin: Skin is warm. No rash noted. No erythema.  Psychiatric: Her mood appears anxious.  Well groomed, good eye contact.    ASSESSMENT AND PLAN:  Ms Weldon was seen today because SOB and fatigue.  Orders Placed This Encounter  Procedures  . CT Abdomen Pelvis Wo Contrast  . TSH    Abnormal thyroid function test Mild. I do not think this is causing fatigue. She is coming tomorrow for labs.  Abdominal pain, unspecified abdominal location Left-sided. This + iron def anemia and elevated sed rate, I think warrant abdominal CT. Instructed about warning signs.  -     CT Abdomen Pelvis Wo Contrast; Future  Elevated erythrocyte sedimentation rate It  seems to be elevated for a few years and getting worse. Possible causes discussed including malignancy.  -     CT Abdomen Pelvis Wo Contrast; Future  Fatigue Chronic. Possible  etiologies discussed. Some non significant labs abnormalities, except for elevated ESR. A few of her chronic medical conditions can be contributing to problem She doe snot want to consider having sleep test,she does not think she can wear CPAP mask if recommended.    Iron deficiency anemia She will resume Fe Sulfate 325 mg every other day with vit C. Instructed about warning signs. We discussed the possibility of having GI bleed, she is not interested in GI evaluation for now. Instructed about warning signs.  SOB (shortness of breath) This is also a chronic problem. Pending stress test. She is also having work up ordered by her pulmonologist. ? Deconditioning. Instructed about warning signs.   Cough Chronic. Possible causes discussed, ? ILD,GERD,COPD among some to consider. Pending PFT's. Recommend adding Pepcid 20 mg at bedtime. Continue Omeprazole 20 mg.  Skin pruritus Follows with dermatologist. Topical moisturizers have helped some. FLP ordered. Pepcid at bedtime may help.   40 min face to face OV. > 50% was dedicated to discussion of differential Dx, prognosis, treatment options, and discussion of plan of care.  Return if symptoms worsen or fail to improve, for Depending on lab results.    Betty G. Martinique, MD  [facility]. [place] office.

## 2019-08-14 NOTE — Telephone Encounter (Signed)
Left message for patient to call back  

## 2019-08-14 NOTE — Assessment & Plan Note (Signed)
Chronic. Possible etiologies discussed. Some non significant labs abnormalities, except for elevated ESR. A few of her chronic medical conditions can be contributing to problem She doe snot want to consider having sleep test,she does not think she can wear CPAP mask if recommended.

## 2019-08-14 NOTE — Assessment & Plan Note (Signed)
She will resume Fe Sulfate 325 mg every other day with vit C. Instructed about warning signs. We discussed the possibility of having GI bleed, she is not interested in GI evaluation for now. Instructed about warning signs.

## 2019-08-14 NOTE — Telephone Encounter (Signed)
Pt was called and scheduled with Dr. Martinique

## 2019-08-14 NOTE — Telephone Encounter (Signed)
No, I agree she should be seen by her PCP. I am still waiting on the rest of her workup. If her predominant symptom remains cough, she could start an anti-tussive, but I think being evaluated by her PCP is the right thing to do. She can make an APP appointment this week if she would like, but we are still waiting on her PFTs that were ordered last month.

## 2019-08-15 ENCOUNTER — Ambulatory Visit: Payer: Medicare Other | Admitting: Critical Care Medicine

## 2019-08-15 ENCOUNTER — Other Ambulatory Visit (INDEPENDENT_AMBULATORY_CARE_PROVIDER_SITE_OTHER): Payer: Medicare Other

## 2019-08-15 DIAGNOSIS — R700 Elevated erythrocyte sedimentation rate: Secondary | ICD-10-CM

## 2019-08-15 DIAGNOSIS — D509 Iron deficiency anemia, unspecified: Secondary | ICD-10-CM

## 2019-08-15 DIAGNOSIS — R5383 Other fatigue: Secondary | ICD-10-CM

## 2019-08-15 LAB — TSH: TSH: 5.26 u[IU]/mL — ABNORMAL HIGH (ref 0.35–4.50)

## 2019-08-15 LAB — CBC
HCT: 32.3 % — ABNORMAL LOW (ref 36.0–46.0)
Hemoglobin: 10.3 g/dL — ABNORMAL LOW (ref 12.0–15.0)
MCHC: 31.9 g/dL (ref 30.0–36.0)
MCV: 94.5 fl (ref 78.0–100.0)
Platelets: 215 10*3/uL (ref 150.0–400.0)
RBC: 3.42 Mil/uL — ABNORMAL LOW (ref 3.87–5.11)
RDW: 20.7 % — ABNORMAL HIGH (ref 11.5–15.5)
WBC: 6 10*3/uL (ref 4.0–10.5)

## 2019-08-15 LAB — VITAMIN D 25 HYDROXY (VIT D DEFICIENCY, FRACTURES): VITD: 38.25 ng/mL (ref 30.00–100.00)

## 2019-08-15 NOTE — Addendum Note (Signed)
Addended by: Suzette Battiest on: 08/15/2019 10:08 AM   Modules accepted: Orders

## 2019-08-16 ENCOUNTER — Telehealth (HOSPITAL_COMMUNITY): Payer: Self-pay | Admitting: *Deleted

## 2019-08-16 LAB — SEDIMENTATION RATE: Sed Rate: 17 mm/h (ref 0–30)

## 2019-08-16 LAB — PTH, INTACT AND CALCIUM
Calcium: 10.7 mg/dL — ABNORMAL HIGH (ref 8.6–10.4)
PTH: 68 pg/mL — ABNORMAL HIGH (ref 14–64)

## 2019-08-16 NOTE — Telephone Encounter (Signed)
Patient wanted to cancel nuclear test and will call back to reschedule.Molly [name], Ranae [place]ms

## 2019-08-16 NOTE — Telephone Encounter (Signed)
Spoke with pt. She is aware of Dr. Ainsley Spinner response. States that she saw her PCP yesterday. Reports that her PCP did a lot of blood work when she was seen. Pt is scheduled for a CPST next week but feels like she won't be able to do it because "she is breathing horrible." Advised her that these tests are being order so we can find out what is causing her to "breathe horrible." Pt does not want to schedule her PFT at this time due to her "breathing horrible."  Dr. Carlis Abbott - please advise if you have any further recommendations.

## 2019-08-17 ENCOUNTER — Telehealth: Payer: Self-pay

## 2019-08-17 NOTE — Telephone Encounter (Signed)
Copied from [place] [id number]. Topic: General - Other >> Aug 17, 2019 10:48 AM Yvette Rack wrote: Reason for CRM: Pt called in for an update on her lab results. Pt requests call back

## 2019-08-22 DIAGNOSIS — N183 Chronic kidney disease, stage 3 unspecified: Secondary | ICD-10-CM | POA: Diagnosis not present

## 2019-08-22 DIAGNOSIS — I4819 Other persistent atrial fibrillation: Secondary | ICD-10-CM | POA: Diagnosis not present

## 2019-08-22 DIAGNOSIS — I130 Hypertensive heart and chronic kidney disease with heart failure and stage 1 through stage 4 chronic kidney disease, or unspecified chronic kidney disease: Secondary | ICD-10-CM | POA: Diagnosis not present

## 2019-08-22 DIAGNOSIS — I495 Sick sinus syndrome: Secondary | ICD-10-CM | POA: Diagnosis not present

## 2019-08-22 DIAGNOSIS — I5043 Acute on chronic combined systolic (congestive) and diastolic (congestive) heart failure: Secondary | ICD-10-CM | POA: Diagnosis not present

## 2019-08-22 DIAGNOSIS — I420 Dilated cardiomyopathy: Secondary | ICD-10-CM | POA: Diagnosis not present

## 2019-08-22 NOTE — Telephone Encounter (Signed)
Copied from [place] [id number]. Topic: General - Other >> Aug 17, 2019 10:48 AM Yvette Rack wrote: Reason for CRM: Pt called in for an update on her lab results. Pt requests call back >> Aug 22, 2019 10:58 AM Leward Quan A wrote: Patient called back to get an update on labs that were done on 08/15/2019 asking for a call back with an update please. Patient say that she is still feeling pretty bad and need to know from Dr Martinique what is the next step. Ph# [phone]

## 2019-08-23 ENCOUNTER — Encounter (HOSPITAL_COMMUNITY): Payer: Medicare Other

## 2019-08-23 NOTE — Telephone Encounter (Signed)
Labs were reviewed and recommendations given,lab results. Thanks, BJ

## 2019-08-23 NOTE — Telephone Encounter (Signed)
No results/recommendations in labs from 08/15/19.  Dr. Martinique has not made any notations.

## 2019-08-23 NOTE — Telephone Encounter (Signed)
Copied from [place] [id number]. Topic: General - Other >> Aug 23, 2019 10:02 AM Yvette Rack wrote: Pt called once again requesting lab results. Pt requests call back as soon as possible

## 2019-08-24 ENCOUNTER — Other Ambulatory Visit: Payer: Self-pay | Admitting: *Deleted

## 2019-08-24 DIAGNOSIS — R946 Abnormal results of thyroid function studies: Secondary | ICD-10-CM

## 2019-08-24 MED ORDER — LEVOTHYROXINE SODIUM 50 MCG PO TABS: 25.0000 ug | ORAL_TABLET | Freq: Every day | ORAL | 0 refills | Status: DC

## 2019-08-24 NOTE — Telephone Encounter (Signed)
Copied from [place] [id number]. Topic: General - Other >> Aug 17, 2019 10:48 AM Yvette Rack wrote: Reason for CRM: Pt called in for an update on her lab results. Pt requests call back >> Aug 24, 2019 12:41 PM Leward Quan A wrote: Patient called back to get an update on lab results that were done on 08/15/2019 asking for a call back with an update please. Patient say that she is still feeling pretty bad and need to know from Dr Martinique what is the next step. Ph# [phone] >> Aug 23, 2019 10:02 AM Yvette Rack wrote: Pt called once again requesting lab results. Pt requests call back as soon as possible >> Aug 22, 2019 10:58 AM Leward Quan A wrote: Patient called back to get an update on labs that were done on 08/15/2019 asking for a call back with an update please. Patient say that she is still feeling pretty bad and need to know from Dr Martinique what is the next step. Ph# [phone]

## 2019-08-25 NOTE — Telephone Encounter (Signed)
Will route to nurse to have provider address once back in clinic.

## 2019-08-25 NOTE — Telephone Encounter (Signed)
Noted. Left message with patient making her aware there were no further recommendations.   Nothing further needed at this time.

## 2019-08-25 NOTE — Telephone Encounter (Signed)
Patient notified on 08/24/2019 see lab encounter

## 2019-08-25 NOTE — Telephone Encounter (Signed)
Nothing to add. I will see her back in clinic. Hopefully she can do some of the PFTs. That would be helpful. Thanks!  LPC

## 2019-08-30 DIAGNOSIS — I495 Sick sinus syndrome: Secondary | ICD-10-CM | POA: Diagnosis not present

## 2019-08-30 DIAGNOSIS — N183 Chronic kidney disease, stage 3 unspecified: Secondary | ICD-10-CM | POA: Diagnosis not present

## 2019-08-30 DIAGNOSIS — I420 Dilated cardiomyopathy: Secondary | ICD-10-CM | POA: Diagnosis not present

## 2019-08-30 DIAGNOSIS — I130 Hypertensive heart and chronic kidney disease with heart failure and stage 1 through stage 4 chronic kidney disease, or unspecified chronic kidney disease: Secondary | ICD-10-CM | POA: Diagnosis not present

## 2019-08-30 DIAGNOSIS — I5043 Acute on chronic combined systolic (congestive) and diastolic (congestive) heart failure: Secondary | ICD-10-CM | POA: Diagnosis not present

## 2019-08-30 DIAGNOSIS — I4819 Other persistent atrial fibrillation: Secondary | ICD-10-CM | POA: Diagnosis not present

## 2019-08-31 ENCOUNTER — Other Ambulatory Visit: Payer: Self-pay | Admitting: Family Medicine

## 2019-08-31 DIAGNOSIS — F419 Anxiety disorder, unspecified: Secondary | ICD-10-CM

## 2019-09-01 NOTE — Telephone Encounter (Signed)
Last filled 04/24/2019 Last OV 08/14/2019

## 2019-09-03 DIAGNOSIS — M47815 Spondylosis without myelopathy or radiculopathy, thoracolumbar region: Secondary | ICD-10-CM | POA: Diagnosis not present

## 2019-09-03 DIAGNOSIS — N183 Chronic kidney disease, stage 3 unspecified: Secondary | ICD-10-CM | POA: Diagnosis not present

## 2019-09-03 DIAGNOSIS — I5043 Acute on chronic combined systolic (congestive) and diastolic (congestive) heart failure: Secondary | ICD-10-CM | POA: Diagnosis not present

## 2019-09-03 DIAGNOSIS — I080 Rheumatic disorders of both mitral and aortic valves: Secondary | ICD-10-CM | POA: Diagnosis not present

## 2019-09-03 DIAGNOSIS — I4819 Other persistent atrial fibrillation: Secondary | ICD-10-CM | POA: Diagnosis not present

## 2019-09-03 DIAGNOSIS — Z85828 Personal history of other malignant neoplasm of skin: Secondary | ICD-10-CM | POA: Diagnosis not present

## 2019-09-03 DIAGNOSIS — I130 Hypertensive heart and chronic kidney disease with heart failure and stage 1 through stage 4 chronic kidney disease, or unspecified chronic kidney disease: Secondary | ICD-10-CM | POA: Diagnosis not present

## 2019-09-03 DIAGNOSIS — I420 Dilated cardiomyopathy: Secondary | ICD-10-CM | POA: Diagnosis not present

## 2019-09-03 DIAGNOSIS — M545 Low back pain: Secondary | ICD-10-CM | POA: Diagnosis not present

## 2019-09-03 DIAGNOSIS — Z89421 Acquired absence of other right toe(s): Secondary | ICD-10-CM | POA: Diagnosis not present

## 2019-09-03 DIAGNOSIS — Z950 Presence of cardiac pacemaker: Secondary | ICD-10-CM | POA: Diagnosis not present

## 2019-09-03 DIAGNOSIS — Z7901 Long term (current) use of anticoagulants: Secondary | ICD-10-CM | POA: Diagnosis not present

## 2019-09-03 DIAGNOSIS — G8929 Other chronic pain: Secondary | ICD-10-CM | POA: Diagnosis not present

## 2019-09-03 DIAGNOSIS — K219 Gastro-esophageal reflux disease without esophagitis: Secondary | ICD-10-CM | POA: Diagnosis not present

## 2019-09-03 DIAGNOSIS — I495 Sick sinus syndrome: Secondary | ICD-10-CM | POA: Diagnosis not present

## 2019-09-03 DIAGNOSIS — E785 Hyperlipidemia, unspecified: Secondary | ICD-10-CM | POA: Diagnosis not present

## 2019-09-03 DIAGNOSIS — F419 Anxiety disorder, unspecified: Secondary | ICD-10-CM | POA: Diagnosis not present

## 2019-09-04 ENCOUNTER — Telehealth: Payer: Self-pay | Admitting: Cardiology

## 2019-09-04 DIAGNOSIS — I4819 Other persistent atrial fibrillation: Secondary | ICD-10-CM | POA: Diagnosis not present

## 2019-09-04 DIAGNOSIS — N183 Chronic kidney disease, stage 3 unspecified: Secondary | ICD-10-CM | POA: Diagnosis not present

## 2019-09-04 DIAGNOSIS — I5043 Acute on chronic combined systolic (congestive) and diastolic (congestive) heart failure: Secondary | ICD-10-CM | POA: Diagnosis not present

## 2019-09-04 DIAGNOSIS — I420 Dilated cardiomyopathy: Secondary | ICD-10-CM | POA: Diagnosis not present

## 2019-09-04 DIAGNOSIS — I130 Hypertensive heart and chronic kidney disease with heart failure and stage 1 through stage 4 chronic kidney disease, or unspecified chronic kidney disease: Secondary | ICD-10-CM | POA: Diagnosis not present

## 2019-09-04 DIAGNOSIS — I495 Sick sinus syndrome: Secondary | ICD-10-CM | POA: Diagnosis not present

## 2019-09-04 NOTE — Telephone Encounter (Signed)
Molly Overlie do you have any other recommendations.  I suspect her sx are not related to cardiac issues but she does have pacer and wonder if she would benefit from BiV upgrade  Patient needs to see her PCP

## 2019-09-04 NOTE — Telephone Encounter (Signed)
I spoke with pt. She report home health nurse comes out 2 times per week and just left. Pt reports she has been fatigued for several months but it seems worse ithe last few days.  Reports worsening swelling in lower legs. She does not weigh daily.  Weight today is 190.6 lbs. On 11/7 it was 188 lbs.  No other weights until the week prior to this when she weighed 186 lbs. Denies increased salt intake. She eats take out food daily but this is not new for her. Poor appetite.  Not sleeping well. Stress test was scheduled but pt cancelled due to fatigue. Has been started on iron by primary care. Cough for the last 5-6 months. Pt feels shortness of breath and fatigue are worsening in last few days. She is worn out after taking a shower. I told pt I would send message to Dr Radford Pax.  I advised her to go to ED for evaluation if symptoms worsen or she feels she will pass out.

## 2019-09-04 NOTE — Telephone Encounter (Signed)
Anda Kraft from [place] [facility] called stating Ms. Memon has been feeling increasely weak in the past 3-4 days, and has increased SOB, has gained 3-4lbs in the past week, and is not eating very much.  Anda Kraft can be reached at [phone] if you have any questions, she stated to please contact the patient.

## 2019-09-05 ENCOUNTER — Other Ambulatory Visit: Payer: Medicare Other

## 2019-09-06 ENCOUNTER — Telehealth: Payer: Self-pay | Admitting: *Deleted

## 2019-09-06 ENCOUNTER — Other Ambulatory Visit: Payer: Self-pay

## 2019-09-06 ENCOUNTER — Ambulatory Visit (INDEPENDENT_AMBULATORY_CARE_PROVIDER_SITE_OTHER): Payer: Medicare Other | Admitting: General Practice

## 2019-09-06 DIAGNOSIS — I5042 Chronic combined systolic (congestive) and diastolic (congestive) heart failure: Secondary | ICD-10-CM

## 2019-09-06 DIAGNOSIS — Z7901 Long term (current) use of anticoagulants: Secondary | ICD-10-CM

## 2019-09-06 LAB — POCT INR: INR: 3.4 — AB (ref 2.0–3.0)

## 2019-09-06 NOTE — Telephone Encounter (Signed)
Please have her come in for TSH, BNP, CBC and CMET today and then see PA tomorrow in office

## 2019-09-06 NOTE — Telephone Encounter (Signed)
Spoke with pt re Dr Radford Pax referring pt to [facility] and pt is awaiting a return call to schedule an appt after Christmas. Also pt continues to c/o SOB is currently only taking Furosemide 20 mg qod Will let Dr Radford Pax know./cy

## 2019-09-06 NOTE — Telephone Encounter (Signed)
Yes as long as she comes in for the labs

## 2019-09-06 NOTE — Addendum Note (Signed)
Addended by: Beckie Busing on: 09/06/2019 03:30 PM   Modules accepted: Orders

## 2019-09-06 NOTE — Telephone Encounter (Signed)
Call placed to Pt.  Advised labs will be drawn at [place] office.

## 2019-09-06 NOTE — Telephone Encounter (Signed)
Labs cancelled after they had been drawn. New orders placed.

## 2019-09-06 NOTE — Patient Instructions (Addendum)
Pre visit review using our clinic review tool, if applicable. No additional management support is needed unless otherwise documented below in the visit note.  Hold coumadin today and then change dosage and take 1/2 tablet daily.  Re-check in 4 weeks.

## 2019-09-06 NOTE — Telephone Encounter (Signed)
Pt is having INR checked at [facility] with [facility] at [place] location and pt can have  labs done while at this office .cy

## 2019-09-07 DIAGNOSIS — R0600 Dyspnea, unspecified: Secondary | ICD-10-CM | POA: Diagnosis not present

## 2019-09-07 DIAGNOSIS — D509 Iron deficiency anemia, unspecified: Secondary | ICD-10-CM | POA: Diagnosis not present

## 2019-09-07 DIAGNOSIS — Z23 Encounter for immunization: Secondary | ICD-10-CM | POA: Diagnosis not present

## 2019-09-07 DIAGNOSIS — R5382 Chronic fatigue, unspecified: Secondary | ICD-10-CM | POA: Diagnosis not present

## 2019-09-07 DIAGNOSIS — L299 Pruritus, unspecified: Secondary | ICD-10-CM | POA: Diagnosis not present

## 2019-09-07 DIAGNOSIS — R05 Cough: Secondary | ICD-10-CM | POA: Diagnosis not present

## 2019-09-07 LAB — CBC WITH DIFFERENTIAL/PLATELET
Basophils Absolute: 0 10*3/uL (ref 0.0–0.2)
Basos: 1 %
EOS (ABSOLUTE): 0.5 10*3/uL — ABNORMAL HIGH (ref 0.0–0.4)
Eos: 10 %
Hematocrit: 32.5 % — ABNORMAL LOW (ref 34.0–46.6)
Hemoglobin: 10.3 g/dL — ABNORMAL LOW (ref 11.1–15.9)
Immature Grans (Abs): 0 10*3/uL (ref 0.0–0.1)
Immature Granulocytes: 0 %
Lymphocytes Absolute: 1.4 10*3/uL (ref 0.7–3.1)
Lymphs: 27 %
MCH: 29.1 pg (ref 26.6–33.0)
MCHC: 31.7 g/dL (ref 31.5–35.7)
MCV: 92 fL (ref 79–97)
Monocytes Absolute: 0.6 10*3/uL (ref 0.1–0.9)
Monocytes: 12 %
Neutrophils Absolute: 2.7 10*3/uL (ref 1.4–7.0)
Neutrophils: 50 %
Platelets: 162 10*3/uL (ref 150–450)
RBC: 3.54 x10E6/uL — ABNORMAL LOW (ref 3.77–5.28)
RDW: 17.2 % — ABNORMAL HIGH (ref 11.7–15.4)
WBC: 5.3 10*3/uL (ref 3.4–10.8)

## 2019-09-07 LAB — COMPREHENSIVE METABOLIC PANEL
ALT: 16 IU/L (ref 0–32)
AST: 25 IU/L (ref 0–40)
Albumin/Globulin Ratio: 1.7 (ref 1.2–2.2)
Albumin: 4 g/dL (ref 3.6–4.6)
Alkaline Phosphatase: 74 IU/L (ref 39–117)
BUN/Creatinine Ratio: 20 (ref 12–28)
BUN: 25 mg/dL (ref 8–27)
Bilirubin Total: 0.5 mg/dL (ref 0.0–1.2)
CO2: 20 mmol/L (ref 20–29)
Calcium: 11 mg/dL — ABNORMAL HIGH (ref 8.7–10.3)
Chloride: 106 mmol/L (ref 96–106)
Creatinine, Ser: 1.24 mg/dL — ABNORMAL HIGH (ref 0.57–1.00)
GFR calc Af Amer: 46 mL/min/{1.73_m2} — ABNORMAL LOW (ref >59)
GFR calc non Af Amer: 40 mL/min/{1.73_m2} — ABNORMAL LOW (ref >59)
Globulin, Total: 2.3 g/dL (ref 1.5–4.5)
Glucose: 115 mg/dL — ABNORMAL HIGH (ref 65–99)
Potassium: 4.4 mmol/L (ref 3.5–5.2)
Sodium: 139 mmol/L (ref 134–144)
Total Protein: 6.3 g/dL (ref 6.0–8.5)

## 2019-09-07 LAB — TSH: TSH: 6.51 u[IU]/mL — ABNORMAL HIGH (ref 0.450–4.500)

## 2019-09-07 LAB — BRAIN NATRIURETIC PEPTIDE: BNP: 381 pg/mL — ABNORMAL HIGH (ref 0.0–100.0)

## 2019-09-08 ENCOUNTER — Telehealth: Payer: Self-pay | Admitting: Critical Care Medicine

## 2019-09-08 ENCOUNTER — Telehealth: Payer: Self-pay

## 2019-09-08 DIAGNOSIS — Z79899 Other long term (current) drug therapy: Secondary | ICD-10-CM

## 2019-09-08 DIAGNOSIS — E8770 Fluid overload, unspecified: Secondary | ICD-10-CM

## 2019-09-08 MED ORDER — FUROSEMIDE 20 MG PO TABS: 20.0000 mg | ORAL_TABLET | Freq: Every day | ORAL | 3 refills | Status: DC

## 2019-09-08 NOTE — Telephone Encounter (Signed)
Notes recorded by Frederik Schmidt, RN on 09/08/2019 at 8:12 AM EST  The patient has been notified of the result and verbalized understanding. All questions (if any) were answered.  Frederik Schmidt, RN 09/08/2019 8:12 AM

## 2019-09-08 NOTE — Telephone Encounter (Signed)
Printed & placed in folder to be scheduled.  

## 2019-09-08 NOTE — Telephone Encounter (Signed)
-----   Message from Molly Margarita, MD sent at 09/07/2019 10:01 PM EST ----- Patient is hypercalcemic and hypothyroid. Please forward these labs to her PCP to followup on and have her make an appt to see PCP to address hypercalcemia. BNP milldy elevated.  Increase lasix to 20mg  daily and repeat BMET in 1 week

## 2019-09-09 ENCOUNTER — Other Ambulatory Visit (HOSPITAL_COMMUNITY): Payer: Self-pay | Admitting: Nurse Practitioner

## 2019-09-10 NOTE — Telephone Encounter (Signed)
I am glad to see her back to discuss treatment options. GT

## 2019-09-13 ENCOUNTER — Other Ambulatory Visit: Payer: Medicare Other | Admitting: *Deleted

## 2019-09-13 ENCOUNTER — Encounter: Payer: Self-pay | Admitting: Internal Medicine

## 2019-09-13 ENCOUNTER — Other Ambulatory Visit: Payer: Self-pay

## 2019-09-13 ENCOUNTER — Ambulatory Visit (INDEPENDENT_AMBULATORY_CARE_PROVIDER_SITE_OTHER): Payer: Medicare Other | Admitting: Internal Medicine

## 2019-09-13 VITALS — BP 120/86 | HR 71 | Ht 65.0 in | Wt 191.0 lb

## 2019-09-13 DIAGNOSIS — I4819 Other persistent atrial fibrillation: Secondary | ICD-10-CM

## 2019-09-13 DIAGNOSIS — E8770 Fluid overload, unspecified: Secondary | ICD-10-CM

## 2019-09-13 DIAGNOSIS — I4719 Other supraventricular tachycardia: Secondary | ICD-10-CM | POA: Insufficient documentation

## 2019-09-13 DIAGNOSIS — Z79899 Other long term (current) drug therapy: Secondary | ICD-10-CM

## 2019-09-13 DIAGNOSIS — Z950 Presence of cardiac pacemaker: Secondary | ICD-10-CM

## 2019-09-13 DIAGNOSIS — I495 Sick sinus syndrome: Secondary | ICD-10-CM | POA: Diagnosis not present

## 2019-09-13 DIAGNOSIS — I471 Supraventricular tachycardia: Secondary | ICD-10-CM | POA: Diagnosis not present

## 2019-09-13 MED ORDER — FUROSEMIDE 40 MG PO TABS: 40.0000 mg | ORAL_TABLET | Freq: Every day | ORAL | 3 refills | Status: DC

## 2019-09-13 NOTE — Progress Notes (Signed)
HPI Molly [name] returns today for followup. She is a pleasant 85 yo woman with chronic atrial fib, CHB, after AV node ablation, chronic systolic heart failure, and chronic renal insufficiency. She has had progressive worsening dyspnea in the setting of stage 3 renal insufficiency. Her symptoms are class 3B.  She has pacing induced LBBB with a QRS duration of 190.  Allergies  Allergen Reactions  . Ace Inhibitors     hyperkalemia  . Losartan     hyperkalemia  . Codeine Nausea Only  . Tramadol Nausea Only     Current Outpatient Medications  Medication Sig Dispense Refill  . acetaminophen (TYLENOL) 500 MG tablet Take 1,000 mg by mouth 2 (two) times daily as needed for mild pain. For pain     . dorzolamide (TRUSOPT) 2 % ophthalmic solution Place 1 drop into the right eye 2 (two) times daily.    . ferrous sulfate 325 (65 FE) MG tablet Take 325 mg by mouth every other day.    . fluticasone (FLONASE) 50 MCG/ACT nasal spray Place 2 sprays into both nostrils daily. 16 g 2  . furosemide (LASIX) 20 MG tablet Take 1 tablet (20 mg total) by mouth daily. 90 tablet 3  . levothyroxine (SYNTHROID) 50 MCG tablet Take 0.5 tablets (25 mcg total) by mouth daily. 90 tablet 0  . LORazepam (ATIVAN) 0.5 MG tablet TAKE 1 TABLET (0.5 MG TOTAL) BY MOUTH AT BEDTIME. 30 tablet 2  . Multiple Vitamin (MULTIVITAMIN WITH MINERALS) TABS Take 1 tablet by mouth daily.    Marland Kitchen omeprazole (PRILOSEC) 20 MG capsule Take 20 mg by mouth daily.    Marland Kitchen warfarin (COUMADIN) 5 MG tablet TAKE 1/2 TO 1 TABLET DAILY AS DIRECTED BY ANTICOAGULATION CLINIC (Patient taking differently: Take 2.5-5 mg by mouth See admin instructions. Take 2.5mg  daily everyday except on mondays take 5mg  daily) 90 tablet 1   No current facility-administered medications for this visit.      Past Medical History:  Diagnosis Date  . Aortic insufficiency   . Atrial tachycardia ([place])   . Cancer ([place])    Skin cancer- basal.  1 mylenoma  . Chronic combined  systolic and diastolic CHF (congestive heart failure) ([place])   . CKD (chronic kidney disease), stage III   . Complication of anesthesia   . DCM (dilated cardiomyopathy) ([place])    EF 40-45% by echo 2017 - nuclear stress test with no ischemia  . GERD (gastroesophageal reflux disease)   . H/O benign essential tremor    on amiodarone resolved off amio  . H/O cardiac radiofrequency ablation    a. AV node ablation in 05/2019.  Marland Kitchen H/O hyperkalemia    Resolved off ACE inhibitors  . High cholesterol   . History of blood transfusion   . Hyperkalemia   . Hyperparathyroidism ([place])   . Hypertension   . Mitral regurgitation   . Persistent atrial fibrillation ([place])   . PONV (postoperative nausea and vomiting)   . PVC's (premature ventricular contractions)   . Tachycardia-bradycardia syndrome ([place])    s/p PPM 02/2013    ROS:   All systems reviewed and negative except as noted in the HPI.   Past Surgical History:  Procedure Laterality Date  . AMPUTATION Right 11/27/2014   Procedure: RIGHT 2ND AND 3RD TOE AMPUTATION;  Surgeon: Wylene Simmer, MD;  Location: [place];  Service: Orthopedics;  Laterality: Right;  . ATRIAL FIBRILLATION ABLATION N/A 05/29/2019   Procedure: AVN ABLATION;  Surgeon: [name]  W, MD;  Location: [place] CV LAB;  Service: Cardiovascular;  Laterality: N/A;  . BUNIONECTOMY     Right  . CARDIAC CATHETERIZATION  09/25/2000   EF of 50% -- with normal left ventricular size and function  . CARDIOVERSION N/A 12/28/2017   Procedure: CARDIOVERSION;  Surgeon: Dorothy Spark, MD;  Location[facility] ENDOSCOPY;  Service: Cardiovascular;  Laterality: N/A;  . CARDIOVERSION N/A 02/25/2018   Procedure: CARDIOVERSION;  Surgeon: Skeet Latch, MD;  Location: [place];  Service: Cardiovascular;  Laterality: N/A;  . CESAREAN SECTION    . CESAREAN SECTION    . EYE SURGERY Bilateral    Cataract  . FOOT SURGERY    . INSERT / REPLACE / REMOVE PACEMAKER  02/2013  . PACEMAKER INSERTION  02/2013   . PERMANENT PACEMAKER INSERTION N/A 03/02/2013   Procedure: PERMANENT PACEMAKER INSERTION;  Surgeon: Evans Lance, MD;  Location: [facility] CATH LAB;  Service: Cardiovascular;  Laterality: N/A;  . SHOULDER ARTHROSCOPY W/ ROTATOR CUFF REPAIR    . SHOULDER SURGERY Right    roto cuff  . TOE AMPUTATION Left    2nd and 3rd, bunion under toes.  . TONSILLECTOMY    . VARICOSE VEIN SURGERY       Family History  Problem Relation Age of Onset  . Hypertension Mother   . Hypertension Father   . Breast cancer Sister      Social History   Socioeconomic History  . Marital status: Married    Spouse name: Not on file  . Number of children: 3  . Years of education: Not on file  . Highest education level: Not on file  Occupational History  . Not on file  Social Needs  . Financial resource strain: Not on file  . Food insecurity    Worry: Not on file    Inability: Not on file  . Transportation needs    Medical: Not on file    Non-medical: Not on file  Tobacco Use  . Smoking status: Never Smoker  . Smokeless tobacco: Never Used  Substance and Sexual Activity  . Alcohol use: No  . Drug use: No  . Sexual activity: Not Currently  Lifestyle  . Physical activity    Days per week: Not on file    Minutes per session: Not on file  . Stress: Not on file  Relationships  . Social Herbalist on phone: Not on file    Gets together: Not on file    Attends religious service: Not on file    Active member of club or organization: Not on file    Attends meetings of clubs or organizations: Not on file    Relationship status: Not on file  . Intimate partner violence    Fear of current or ex partner: Not on file    Emotionally abused: Not on file    Physically abused: Not on file    Forced sexual activity: Not on file  Other Topics Concern  . Not on file  Social History Narrative  . Not on file     BP 120/86   Pulse 71   Ht 5\' 5"  (1.651 m)   Wt 191 lb (86.6 kg)   SpO2 98%   BMI  31.78 kg/m   Physical Exam:  Well appearing NAD HEENT: Unremarkable Neck:  No JVD, no thyromegally Lymphatics:  No adenopathy Back:  No CVA tenderness Lungs:  Clear with no wheezes HEART:  Regular rate rhythm, no murmurs,  no rubs, no clicks Abd:  soft, positive bowel sounds, no organomegally, no rebound, no guarding Ext:  2 plus pulses, no edema, no cyanosis, no clubbing Skin:  No rashes no nodules Neuro:  CN II through XII intact, motor grossly intact  EKG - atrial fib with ventricular pacing with pacing induced LBBB  DEVICE  Not interrogated  Assess/Plan: 1. Chronic systolic heart failure - Her symptoms have worsened and she has marked dysynchrony. I have discussed biv PPM upgrade with the patient and the risks/benefits/goals/expectations of biv ppm upgrade were reviewed and she wishes to proceed. 2. Atrial fib - her VR is well controlled after AV node ablation.  3. Stage 3 renal insufficiency - her lasix will be increased to 40 mg daily.  Molly [name].D.

## 2019-09-13 NOTE — Patient Instructions (Addendum)
Medication Instructions:  Your physician has recommended you make the following change in your medication:   1.  Increase your lasix 20 mg--Take 2 tablets by mouth daily.  Labwork: None ordered.  Testing/Procedures: None ordered.  Follow-Up:  See instruction letter.  Remote monitoring is used to monitor your Pacemaker from home. This monitoring reduces the number of office visits required to check your device to one time per year. It allows [place] to keep an eye on the functioning of your device to ensure it is working properly. You are scheduled for a device check from home on 09/25/2019. You may send your transmission at any time that day. If you have a wireless device, the transmission will be sent automatically. After your physician reviews your transmission, you will receive a postcard with your next transmission date.  Any Other Special Instructions Will Be Listed Below (If Applicable).  If you need a refill on your cardiac medications before your next appointment, please call your pharmacy.

## 2019-09-13 NOTE — H&P (View-Only) (Signed)
HPI Molly [name] returns today for followup. She is a pleasant 85 yo woman with chronic atrial fib, CHB, after AV node ablation, chronic systolic heart failure, and chronic renal insufficiency. She has had progressive worsening dyspnea in the setting of stage 3 renal insufficiency. Her symptoms are class 3B.  She has pacing induced LBBB with a QRS duration of 190.  Allergies  Allergen Reactions  . Ace Inhibitors     hyperkalemia  . Losartan     hyperkalemia  . Codeine Nausea Only  . Tramadol Nausea Only     Current Outpatient Medications  Medication Sig Dispense Refill  . acetaminophen (TYLENOL) 500 MG tablet Take 1,000 mg by mouth 2 (two) times daily as needed for mild pain. For pain     . dorzolamide (TRUSOPT) 2 % ophthalmic solution Place 1 drop into the right eye 2 (two) times daily.    . ferrous sulfate 325 (65 FE) MG tablet Take 325 mg by mouth every other day.    . fluticasone (FLONASE) 50 MCG/ACT nasal spray Place 2 sprays into both nostrils daily. 16 g 2  . furosemide (LASIX) 20 MG tablet Take 1 tablet (20 mg total) by mouth daily. 90 tablet 3  . levothyroxine (SYNTHROID) 50 MCG tablet Take 0.5 tablets (25 mcg total) by mouth daily. 90 tablet 0  . LORazepam (ATIVAN) 0.5 MG tablet TAKE 1 TABLET (0.5 MG TOTAL) BY MOUTH AT BEDTIME. 30 tablet 2  . Multiple Vitamin (MULTIVITAMIN WITH MINERALS) TABS Take 1 tablet by mouth daily.    Marland Kitchen omeprazole (PRILOSEC) 20 MG capsule Take 20 mg by mouth daily.    Marland Kitchen warfarin (COUMADIN) 5 MG tablet TAKE 1/2 TO 1 TABLET DAILY AS DIRECTED BY ANTICOAGULATION CLINIC (Patient taking differently: Take 2.5-5 mg by mouth See admin instructions. Take 2.5mg  daily everyday except on mondays take 5mg  daily) 90 tablet 1   No current facility-administered medications for this visit.      Past Medical History:  Diagnosis Date  . Aortic insufficiency   . Atrial tachycardia ([place])   . Cancer ([place])    Skin cancer- basal.  1 mylenoma  . Chronic combined  systolic and diastolic CHF (congestive heart failure) ([place])   . CKD (chronic kidney disease), stage III   . Complication of anesthesia   . DCM (dilated cardiomyopathy) ([place])    EF 40-45% by echo 2017 - nuclear stress test with no ischemia  . GERD (gastroesophageal reflux disease)   . H/O benign essential tremor    on amiodarone resolved off amio  . H/O cardiac radiofrequency ablation    a. AV node ablation in 05/2019.  Marland Kitchen H/O hyperkalemia    Resolved off ACE inhibitors  . High cholesterol   . History of blood transfusion   . Hyperkalemia   . Hyperparathyroidism ([place])   . Hypertension   . Mitral regurgitation   . Persistent atrial fibrillation ([place])   . PONV (postoperative nausea and vomiting)   . PVC's (premature ventricular contractions)   . Tachycardia-bradycardia syndrome ([place])    s/p PPM 02/2013    ROS:   All systems reviewed and negative except as noted in the HPI.   Past Surgical History:  Procedure Laterality Date  . AMPUTATION Right 11/27/2014   Procedure: RIGHT 2ND AND 3RD TOE AMPUTATION;  Surgeon: Wylene Simmer, MD;  Location: [place];  Service: Orthopedics;  Laterality: Right;  . ATRIAL FIBRILLATION ABLATION N/A 05/29/2019   Procedure: AVN ABLATION;  Surgeon: [name]  W, MD;  Location: [place] CV LAB;  Service: Cardiovascular;  Laterality: N/A;  . BUNIONECTOMY     Right  . CARDIAC CATHETERIZATION  09/25/2000   EF of 50% -- with normal left ventricular size and function  . CARDIOVERSION N/A 12/28/2017   Procedure: CARDIOVERSION;  Surgeon: Dorothy Spark, MD;  Location: [facility] ENDOSCOPY;  Service: Cardiovascular;  Laterality: N/A;  . CARDIOVERSION N/A 02/25/2018   Procedure: CARDIOVERSION;  Surgeon: Skeet Latch, MD;  Location: [place];  Service: Cardiovascular;  Laterality: N/A;  . CESAREAN SECTION    . CESAREAN SECTION    . EYE SURGERY Bilateral    Cataract  . FOOT SURGERY    . INSERT / REPLACE / REMOVE PACEMAKER  02/2013  . PACEMAKER INSERTION  02/2013   . PERMANENT PACEMAKER INSERTION N/A 03/02/2013   Procedure: PERMANENT PACEMAKER INSERTION;  Surgeon: Evans Lance, MD;  Location: [facility] CATH LAB;  Service: Cardiovascular;  Laterality: N/A;  . SHOULDER ARTHROSCOPY W/ ROTATOR CUFF REPAIR    . SHOULDER SURGERY Right    roto cuff  . TOE AMPUTATION Left    2nd and 3rd, bunion under toes.  . TONSILLECTOMY    . VARICOSE VEIN SURGERY       Family History  Problem Relation Age of Onset  . Hypertension Mother   . Hypertension Father   . Breast cancer Sister      Social History   Socioeconomic History  . Marital status: Married    Spouse name: Not on file  . Number of children: 3  . Years of education: Not on file  . Highest education level: Not on file  Occupational History  . Not on file  Social Needs  . Financial resource strain: Not on file  . Food insecurity    Worry: Not on file    Inability: Not on file  . Transportation needs    Medical: Not on file    Non-medical: Not on file  Tobacco Use  . Smoking status: Never Smoker  . Smokeless tobacco: Never Used  Substance and Sexual Activity  . Alcohol use: No  . Drug use: No  . Sexual activity: Not Currently  Lifestyle  . Physical activity    Days per week: Not on file    Minutes per session: Not on file  . Stress: Not on file  Relationships  . Social Herbalist on phone: Not on file    Gets together: Not on file    Attends religious service: Not on file    Active member of club or organization: Not on file    Attends meetings of clubs or organizations: Not on file    Relationship status: Not on file  . Intimate partner violence    Fear of current or ex partner: Not on file    Emotionally abused: Not on file    Physically abused: Not on file    Forced sexual activity: Not on file  Other Topics Concern  . Not on file  Social History Narrative  . Not on file     BP 120/86   Pulse 71   Ht 5\' 5"  (1.651 m)   Wt 191 lb (86.6 kg)   SpO2 98%   BMI  31.78 kg/m   Physical Exam:  Well appearing NAD HEENT: Unremarkable Neck:  No JVD, no thyromegally Lymphatics:  No adenopathy Back:  No CVA tenderness Lungs:  Clear with no wheezes HEART:  Regular rate rhythm, no murmurs,  no rubs, no clicks Abd:  soft, positive bowel sounds, no organomegally, no rebound, no guarding Ext:  2 plus pulses, no edema, no cyanosis, no clubbing Skin:  No rashes no nodules Neuro:  CN II through XII intact, motor grossly intact  EKG - atrial fib with ventricular pacing with pacing induced LBBB  DEVICE  Not interrogated  Assess/Plan: 1. Chronic systolic heart failure - Her symptoms have worsened and she has marked dysynchrony. I have discussed biv PPM upgrade with the patient and the risks/benefits/goals/expectations of biv ppm upgrade were reviewed and she wishes to proceed. 2. Atrial fib - her VR is well controlled after AV node ablation.  3. Stage 3 renal insufficiency - her lasix will be increased to 40 mg daily.  Mikle Bosworth.D.

## 2019-09-14 ENCOUNTER — Telehealth: Payer: Self-pay

## 2019-09-14 ENCOUNTER — Telehealth: Payer: Self-pay | Admitting: Family Medicine

## 2019-09-14 ENCOUNTER — Other Ambulatory Visit: Payer: Self-pay | Admitting: *Deleted

## 2019-09-14 DIAGNOSIS — R600 Localized edema: Secondary | ICD-10-CM

## 2019-09-14 LAB — BASIC METABOLIC PANEL
BUN/Creatinine Ratio: 19 (ref 12–28)
BUN: 22 mg/dL (ref 8–27)
CO2: 22 mmol/L (ref 20–29)
Calcium: 10.9 mg/dL — ABNORMAL HIGH (ref 8.7–10.3)
Chloride: 107 mmol/L — ABNORMAL HIGH (ref 96–106)
Creatinine, Ser: 1.18 mg/dL — ABNORMAL HIGH (ref 0.57–1.00)
GFR calc Af Amer: 49 mL/min/{1.73_m2} — ABNORMAL LOW (ref >59)
GFR calc non Af Amer: 42 mL/min/{1.73_m2} — ABNORMAL LOW (ref >59)
Glucose: 109 mg/dL — ABNORMAL HIGH (ref 65–99)
Potassium: 4.5 mmol/L (ref 3.5–5.2)
Sodium: 142 mmol/L (ref 134–144)

## 2019-09-14 NOTE — Telephone Encounter (Signed)
Molly [name], from [facility], called stating she is needing to put pts PT on hold for two weeks until pts pacemaker procedure is finished. Please advise.    [phone] secure

## 2019-09-14 NOTE — Telephone Encounter (Signed)
Call placed to Pt.    She would like to schedule PPM upgrade for December 2.  Covid test scheduled for 11/28.  Advised Pt to arrive at 9:00 am on December 2.  Advised she does not need repeat BNP prior to procedure.

## 2019-09-15 NOTE — Telephone Encounter (Signed)
Left a detailed message on verified voice mail relaying Dr. Morrison Old message.

## 2019-09-15 NOTE — Telephone Encounter (Signed)
It is okay to postpone physical therapy until after heart procedure is done and she has recovered. Thanks, BJ

## 2019-09-19 ENCOUNTER — Telehealth: Payer: Self-pay | Admitting: Internal Medicine

## 2019-09-19 ENCOUNTER — Other Ambulatory Visit: Payer: Self-pay

## 2019-09-19 ENCOUNTER — Other Ambulatory Visit: Payer: Self-pay | Admitting: Cardiology

## 2019-09-19 ENCOUNTER — Ambulatory Visit (HOSPITAL_COMMUNITY)
Admission: RE | Admit: 2019-09-19 | Discharge: 2019-09-19 | Disposition: A | Discharge status: Home / Self Care | Payer: Medicare Other | Source: Ambulatory Visit | Attending: Internal Medicine | Admitting: Internal Medicine

## 2019-09-19 DIAGNOSIS — Z5181 Encounter for therapeutic drug level monitoring: Secondary | ICD-10-CM

## 2019-09-19 DIAGNOSIS — Z7901 Long term (current) use of anticoagulants: Secondary | ICD-10-CM

## 2019-09-19 DIAGNOSIS — D509 Iron deficiency anemia, unspecified: Secondary | ICD-10-CM | POA: Insufficient documentation

## 2019-09-19 MED ORDER — SODIUM CHLORIDE 0.9 % IV SOLN
510.0000 mg | Freq: Once | INTRAVENOUS | Status: AC
  Administered 2019-09-19: 510 mg via INTRAVENOUS
  Filled 2019-09-19: qty 17

## 2019-09-19 MED ORDER — SODIUM CHLORIDE 0.9 % IV SOLN
INTRAVENOUS | Status: DC | PRN
  Administered 2019-09-19: 12:00:00 250 mL via INTRAVENOUS

## 2019-09-19 NOTE — Progress Notes (Signed)
PATIENT CARE CENTER NOTE  Diagnosis: Iron Deficiency Anemia    Provider: Harlan Stains, MD   Procedure: Feraheme IV   Note: Patient received Feraheme infusion via PIV. Tolerated well with no adverse reaction. Observed patient for 30 minutes post-infusion. Vital signs stable. Discharge instructions given. Patient to come back next week for second dose.  Alert, oriented and ambulatory to wheelchair at discharge.

## 2019-09-19 NOTE — Telephone Encounter (Signed)
New Message  Pt called and have questions about the procedure on 09/27/19.   Please call

## 2019-09-19 NOTE — Discharge Instructions (Signed)

## 2019-09-20 ENCOUNTER — Other Ambulatory Visit: Payer: Medicare Other

## 2019-09-20 NOTE — Telephone Encounter (Signed)
Returned call to Pt  All questions answered.  Advised her procedure had been delayed and she should arrive to [facility] for procedure at 12:30 pm.    Pt indicates understanding.

## 2019-09-23 ENCOUNTER — Other Ambulatory Visit (HOSPITAL_COMMUNITY)
Admission: RE | Admit: 2019-09-23 | Discharge: 2019-09-23 | Disposition: A | Discharge status: Home / Self Care | Payer: Medicare Other | Source: Ambulatory Visit | Attending: Internal Medicine | Admitting: Internal Medicine

## 2019-09-23 DIAGNOSIS — Z01812 Encounter for preprocedural laboratory examination: Secondary | ICD-10-CM | POA: Insufficient documentation

## 2019-09-23 DIAGNOSIS — Z20828 Contact with and (suspected) exposure to other viral communicable diseases: Secondary | ICD-10-CM | POA: Insufficient documentation

## 2019-09-24 LAB — NOVEL CORONAVIRUS, NAA (HOSP ORDER, SEND-OUT TO REF LAB; TAT 18-24 HRS): SARS-CoV-2, NAA: NOT DETECTED

## 2019-09-25 ENCOUNTER — Ambulatory Visit (INDEPENDENT_AMBULATORY_CARE_PROVIDER_SITE_OTHER): Payer: Medicare Other | Admitting: *Deleted

## 2019-09-25 DIAGNOSIS — I442 Atrioventricular block, complete: Secondary | ICD-10-CM | POA: Diagnosis not present

## 2019-09-25 LAB — CUP PACEART REMOTE DEVICE CHECK
Battery Remaining Longevity: 84 mo
Battery Remaining Percentage: 90 %
Brady Statistic RA Percent Paced: 0 %
Brady Statistic RV Percent Paced: 85 %
Date Time Interrogation Session: 20201130024100
Implantable Lead Implant Date: 20140508
Implantable Lead Implant Date: 20140508
Implantable Lead Location: 753859
Implantable Lead Location: 753860
Implantable Lead Model: 4135
Implantable Lead Model: 4136
Implantable Lead Serial Number: 29333020
Implantable Lead Serial Number: 29379474
Implantable Pulse Generator Implant Date: 20140508
Lead Channel Impedance Value: 584 Ohm
Lead Channel Impedance Value: 588 Ohm
Lead Channel Pacing Threshold Amplitude: 1.1 V
Lead Channel Pacing Threshold Pulse Width: 0.4 ms
Lead Channel Setting Pacing Amplitude: 2.4 V
Lead Channel Setting Pacing Pulse Width: 0.4 ms
Lead Channel Setting Sensing Sensitivity: 2.5 mV
Pulse Gen Serial Number: 112392

## 2019-09-27 ENCOUNTER — Other Ambulatory Visit: Payer: Self-pay

## 2019-09-27 ENCOUNTER — Encounter (HOSPITAL_COMMUNITY)
Admission: RE | Disposition: A | Discharge status: Home / Self Care | Payer: Self-pay | Source: Home / Self Care | Attending: Internal Medicine

## 2019-09-27 ENCOUNTER — Ambulatory Visit (HOSPITAL_COMMUNITY)
Admission: RE | Admit: 2019-09-27 | Discharge: 2019-09-28 | Disposition: A | Discharge status: Home / Self Care | Payer: Medicare Other | Attending: Internal Medicine | Admitting: Internal Medicine

## 2019-09-27 DIAGNOSIS — Z89422 Acquired absence of other left toe(s): Secondary | ICD-10-CM | POA: Diagnosis not present

## 2019-09-27 DIAGNOSIS — Z7901 Long term (current) use of anticoagulants: Secondary | ICD-10-CM | POA: Insufficient documentation

## 2019-09-27 DIAGNOSIS — I080 Rheumatic disorders of both mitral and aortic valves: Secondary | ICD-10-CM | POA: Insufficient documentation

## 2019-09-27 DIAGNOSIS — Z885 Allergy status to narcotic agent status: Secondary | ICD-10-CM | POA: Diagnosis not present

## 2019-09-27 DIAGNOSIS — I130 Hypertensive heart and chronic kidney disease with heart failure and stage 1 through stage 4 chronic kidney disease, or unspecified chronic kidney disease: Secondary | ICD-10-CM | POA: Diagnosis not present

## 2019-09-27 DIAGNOSIS — Z89421 Acquired absence of other right toe(s): Secondary | ICD-10-CM | POA: Diagnosis not present

## 2019-09-27 DIAGNOSIS — Z888 Allergy status to other drugs, medicaments and biological substances status: Secondary | ICD-10-CM | POA: Diagnosis not present

## 2019-09-27 DIAGNOSIS — N183 Chronic kidney disease, stage 3 unspecified: Secondary | ICD-10-CM | POA: Insufficient documentation

## 2019-09-27 DIAGNOSIS — G250 Essential tremor: Secondary | ICD-10-CM | POA: Diagnosis not present

## 2019-09-27 DIAGNOSIS — Z8249 Family history of ischemic heart disease and other diseases of the circulatory system: Secondary | ICD-10-CM | POA: Diagnosis not present

## 2019-09-27 DIAGNOSIS — E213 Hyperparathyroidism, unspecified: Secondary | ICD-10-CM | POA: Insufficient documentation

## 2019-09-27 DIAGNOSIS — I447 Left bundle-branch block, unspecified: Secondary | ICD-10-CM | POA: Diagnosis not present

## 2019-09-27 DIAGNOSIS — Z79890 Hormone replacement therapy (postmenopausal): Secondary | ICD-10-CM | POA: Insufficient documentation

## 2019-09-27 DIAGNOSIS — I442 Atrioventricular block, complete: Secondary | ICD-10-CM | POA: Insufficient documentation

## 2019-09-27 DIAGNOSIS — Z79899 Other long term (current) drug therapy: Secondary | ICD-10-CM | POA: Diagnosis not present

## 2019-09-27 DIAGNOSIS — Z950 Presence of cardiac pacemaker: Secondary | ICD-10-CM

## 2019-09-27 DIAGNOSIS — E7800 Pure hypercholesterolemia, unspecified: Secondary | ICD-10-CM | POA: Insufficient documentation

## 2019-09-27 DIAGNOSIS — I420 Dilated cardiomyopathy: Secondary | ICD-10-CM | POA: Insufficient documentation

## 2019-09-27 DIAGNOSIS — I4819 Other persistent atrial fibrillation: Secondary | ICD-10-CM | POA: Diagnosis not present

## 2019-09-27 DIAGNOSIS — K219 Gastro-esophageal reflux disease without esophagitis: Secondary | ICD-10-CM | POA: Diagnosis not present

## 2019-09-27 DIAGNOSIS — I5022 Chronic systolic (congestive) heart failure: Secondary | ICD-10-CM | POA: Diagnosis not present

## 2019-09-27 DIAGNOSIS — I5042 Chronic combined systolic (congestive) and diastolic (congestive) heart failure: Secondary | ICD-10-CM | POA: Diagnosis not present

## 2019-09-27 HISTORY — PX: BIV UPGRADE: EP1202

## 2019-09-27 LAB — SURGICAL PCR SCREEN
MRSA, PCR: NEGATIVE
Staphylococcus aureus: NEGATIVE

## 2019-09-27 LAB — PROTIME-INR
INR: 2.4 — ABNORMAL HIGH (ref 0.8–1.2)
Prothrombin Time: 26 seconds — ABNORMAL HIGH (ref 11.4–15.2)

## 2019-09-27 SURGERY — BIV UPGRADE
Anesthesia: LOCAL

## 2019-09-27 MED ORDER — CHLORHEXIDINE GLUCONATE 4 % EX LIQD
4.0000 "application " | Freq: Once | CUTANEOUS | Status: DC
  Filled 2019-09-27: qty 60

## 2019-09-27 MED ORDER — LIDOCAINE HCL (PF) 1 % IJ SOLN
INTRAMUSCULAR | Status: DC | PRN
  Administered 2019-09-27: 60 mL

## 2019-09-27 MED ORDER — WARFARIN SODIUM 2.5 MG PO TABS: 2.5000 mg | ORAL_TABLET | Freq: Every day | ORAL | Status: DC

## 2019-09-27 MED ORDER — DORZOLAMIDE HCL 2 % OP SOLN
1.0000 [drp] | Freq: Two times a day (BID) | OPHTHALMIC | Status: DC
  Administered 2019-09-27 – 2019-09-28 (×2): 1 [drp] via OPHTHALMIC
  Filled 2019-09-27: qty 10

## 2019-09-27 MED ORDER — LIDOCAINE HCL 1 % IJ SOLN
INTRAMUSCULAR | Status: AC
  Filled 2019-09-27: qty 20

## 2019-09-27 MED ORDER — ONDANSETRON HCL 4 MG/2ML IJ SOLN: 4.0000 mg | Freq: Four times a day (QID) | INTRAMUSCULAR | Status: DC | PRN

## 2019-09-27 MED ORDER — FUROSEMIDE 40 MG PO TABS
40.0000 mg | ORAL_TABLET | Freq: Every day | ORAL | Status: DC
  Administered 2019-09-27 – 2019-09-28 (×2): 40 mg via ORAL
  Filled 2019-09-27 (×2): qty 1

## 2019-09-27 MED ORDER — LEVOTHYROXINE SODIUM 25 MCG PO TABS
25.0000 ug | ORAL_TABLET | Freq: Every day | ORAL | Status: DC
  Administered 2019-09-27 – 2019-09-28 (×2): 25 ug via ORAL
  Filled 2019-09-27 (×2): qty 1

## 2019-09-27 MED ORDER — SODIUM CHLORIDE 0.9 % IV SOLN
INTRAVENOUS | Status: AC
  Filled 2019-09-27: qty 2

## 2019-09-27 MED ORDER — HEPARIN (PORCINE) IN NACL 1000-0.9 UT/500ML-% IV SOLN
INTRAVENOUS | Status: AC
  Filled 2019-09-27: qty 500

## 2019-09-27 MED ORDER — ACETAMINOPHEN 500 MG PO TABS
1000.0000 mg | ORAL_TABLET | Freq: Two times a day (BID) | ORAL | Status: DC
  Administered 2019-09-27 – 2019-09-28 (×2): 1000 mg via ORAL
  Filled 2019-09-27 (×2): qty 2

## 2019-09-27 MED ORDER — ADULT MULTIVITAMIN W/MINERALS CH
1.0000 | ORAL_TABLET | Freq: Every day | ORAL | Status: DC
  Administered 2019-09-27 – 2019-09-28 (×2): 1 via ORAL
  Filled 2019-09-27 (×2): qty 1

## 2019-09-27 MED ORDER — IOHEXOL 350 MG/ML SOLN
INTRAVENOUS | Status: DC | PRN
  Administered 2019-09-27: 20 mL

## 2019-09-27 MED ORDER — LORAZEPAM 0.5 MG PO TABS
0.5000 mg | ORAL_TABLET | Freq: Every day | ORAL | Status: DC
  Administered 2019-09-27: 0.5 mg via ORAL
  Filled 2019-09-27: qty 1

## 2019-09-27 MED ORDER — FERROUS SULFATE 325 (65 FE) MG PO TABS
325.0000 mg | ORAL_TABLET | ORAL | Status: DC
  Administered 2019-09-27: 325 mg via ORAL
  Filled 2019-09-27: qty 1

## 2019-09-27 MED ORDER — ACETAMINOPHEN 325 MG PO TABS
325.0000 mg | ORAL_TABLET | ORAL | Status: DC | PRN
  Administered 2019-09-28: 650 mg via ORAL
  Filled 2019-09-27: qty 2

## 2019-09-27 MED ORDER — FENTANYL CITRATE (PF) 100 MCG/2ML IJ SOLN
INTRAMUSCULAR | Status: AC
  Filled 2019-09-27: qty 2

## 2019-09-27 MED ORDER — CEFAZOLIN SODIUM-DEXTROSE 1-4 GM/50ML-% IV SOLN
1.0000 g | Freq: Four times a day (QID) | INTRAVENOUS | Status: AC
  Administered 2019-09-27 – 2019-09-28 (×3): 1 g via INTRAVENOUS
  Filled 2019-09-27 (×3): qty 50

## 2019-09-27 MED ORDER — CEFAZOLIN SODIUM-DEXTROSE 2-4 GM/100ML-% IV SOLN
2.0000 g | INTRAVENOUS | Status: AC
  Administered 2019-09-27: 2 g via INTRAVENOUS

## 2019-09-27 MED ORDER — WARFARIN - PHYSICIAN DOSING INPATIENT: Freq: Every day | Status: DC

## 2019-09-27 MED ORDER — MUPIROCIN 2 % EX OINT
TOPICAL_OINTMENT | CUTANEOUS | Status: AC
  Administered 2019-09-27: 1 via TOPICAL
  Filled 2019-09-27: qty 22

## 2019-09-27 MED ORDER — MIDAZOLAM HCL 5 MG/5ML IJ SOLN
INTRAMUSCULAR | Status: DC | PRN
  Administered 2019-09-27 (×4): 1 mg via INTRAVENOUS

## 2019-09-27 MED ORDER — MUPIROCIN 2 % EX OINT
1.0000 "application " | TOPICAL_OINTMENT | Freq: Once | CUTANEOUS | Status: AC
  Administered 2019-09-27: 14:00:00 1 via TOPICAL
  Filled 2019-09-27: qty 22

## 2019-09-27 MED ORDER — FENTANYL CITRATE (PF) 100 MCG/2ML IJ SOLN
INTRAMUSCULAR | Status: DC | PRN
  Administered 2019-09-27 (×4): 12.5 ug via INTRAVENOUS

## 2019-09-27 MED ORDER — SODIUM CHLORIDE 0.9 % IV SOLN
INTRAVENOUS | Status: DC
  Administered 2019-09-27: 14:00:00 via INTRAVENOUS

## 2019-09-27 MED ORDER — SODIUM CHLORIDE 0.9 % IV SOLN
80.0000 mg | INTRAVENOUS | Status: AC
  Administered 2019-09-27: 80 mg

## 2019-09-27 MED ORDER — MIDAZOLAM HCL 5 MG/5ML IJ SOLN
INTRAMUSCULAR | Status: AC
  Filled 2019-09-27: qty 5

## 2019-09-27 MED ORDER — HEPARIN (PORCINE) IN NACL 1000-0.9 UT/500ML-% IV SOLN
INTRAVENOUS | Status: DC | PRN
  Administered 2019-09-27: 500 mL

## 2019-09-27 MED ORDER — SODIUM CHLORIDE 0.9 % IV SOLN
INTRAVENOUS | Status: DC | PRN
  Administered 2019-09-27: 500 mL via INTRAVENOUS

## 2019-09-27 SURGICAL SUPPLY — 13 items
BALLN CATH 6FR 110 (CATHETERS) ×2
CABLE SURGICAL S-101-97-12 (CABLE) ×2 IMPLANT
CATH ACUITYPRO 45CM EH 9F (CATHETERS) ×1 IMPLANT
CATH BALLOON 6FR 110 (CATHETERS) IMPLANT
CATH HEX JOSEPH 2-5-2 65CM 6F (CATHETERS) ×1 IMPLANT
CRT-P PPM VALITUDE U128 (Pacemaker) ×2 IMPLANT
DEVICE CRT-P PPM VALITUDE U128 (Pacemaker) IMPLANT
KIT ESSENTIALS PG (KITS) ×1 IMPLANT
LEAD ACUITY X4 4674 (Lead) ×1 IMPLANT
PAD PRO RADIOLUCENT 2001M-C (PAD) ×2 IMPLANT
SHEATH 9.5FR PRELUDE SNAP 13 (SHEATH) ×1 IMPLANT
TRAY PACEMAKER INSERTION (PACKS) ×2 IMPLANT
WIRE ACUITY WHISPER EDS 4648 (WIRE) ×1 IMPLANT

## 2019-09-27 NOTE — Interval H&P Note (Signed)
History and Physical Interval Note:  09/27/2019 2:47 PM  Molly [name]  has presented today for surgery, with the diagnosis of Congestive heart failure.  The various methods of treatment have been discussed with the patient and family. After consideration of risks, benefits and other options for treatment, the patient has consented to  Procedure(s): BIV UPGRADE (N/A) as a surgical intervention.  The patient's history has been reviewed, patient examined, no change in status, stable for surgery.  I have reviewed the patient's chart and labs.  Questions were answered to the patient's satisfaction.     [name]

## 2019-09-27 NOTE — Progress Notes (Signed)
Pt received from cath lab. Oriented to unit and room. CHG wipes applied. VSS. Call bell in reach. Will continue to monitor.  Arletta Bale, RN

## 2019-09-28 ENCOUNTER — Ambulatory Visit (HOSPITAL_COMMUNITY): Payer: Medicare Other

## 2019-09-28 ENCOUNTER — Other Ambulatory Visit: Payer: Self-pay

## 2019-09-28 ENCOUNTER — Encounter (HOSPITAL_COMMUNITY): Payer: Self-pay | Admitting: Internal Medicine

## 2019-09-28 DIAGNOSIS — N183 Chronic kidney disease, stage 3 unspecified: Secondary | ICD-10-CM | POA: Diagnosis not present

## 2019-09-28 DIAGNOSIS — I442 Atrioventricular block, complete: Secondary | ICD-10-CM

## 2019-09-28 DIAGNOSIS — I5042 Chronic combined systolic (congestive) and diastolic (congestive) heart failure: Secondary | ICD-10-CM | POA: Diagnosis not present

## 2019-09-28 DIAGNOSIS — Z950 Presence of cardiac pacemaker: Secondary | ICD-10-CM | POA: Diagnosis not present

## 2019-09-28 DIAGNOSIS — E213 Hyperparathyroidism, unspecified: Secondary | ICD-10-CM | POA: Diagnosis not present

## 2019-09-28 DIAGNOSIS — I447 Left bundle-branch block, unspecified: Secondary | ICD-10-CM | POA: Diagnosis not present

## 2019-09-28 DIAGNOSIS — I130 Hypertensive heart and chronic kidney disease with heart failure and stage 1 through stage 4 chronic kidney disease, or unspecified chronic kidney disease: Secondary | ICD-10-CM | POA: Diagnosis not present

## 2019-09-28 MED ORDER — WARFARIN SODIUM 5 MG PO TABS: ORAL_TABLET | ORAL | 1 refills | Status: DC

## 2019-09-28 NOTE — Progress Notes (Signed)
Discharge instructions given to patient. IV removed, clean and intact. Medications and wound care reviewed. All questions answered. Patient escorted home by husband.  Arletta Bale, RN

## 2019-09-28 NOTE — Progress Notes (Signed)
Pt had low temperature reading. We compared with 94.8 F orally, 95.4 F axillary and 96.5 F rectally with three kinds of thermometers. Her body and extremities were warm touched, she had no signs of hypothermia. Pt stated she is a cold nature. We gave her thick blankets and adjusted room thermostat at 78 F to night. EKG was V- paced on monitor, HR 70, BP stable, SPO2 93-98% with room air, remained afebrile. Pain tolerated well. Tylenol given per schedule and PRN. Surgical wound dry and clean with original dressing.   Bladder scanned post voided found 349 ml urine retention. Pt complained she did not feel comfortable to void on the bed. We encouraged to use bedpan or Purewick per bedrest order. However, she's able to urinate without any problems on bedside commode at am.. She had 700 ml of urine output tonight. Continue to monitor.  Molly [name] [id number]

## 2019-09-28 NOTE — Discharge Summary (Addendum)
ELECTROPHYSIOLOGY PROCEDURE DISCHARGE SUMMARY    Patient ID: Molly [name],  MRN: [id number], DOB/AGE: Feb 12, 1933 85 y.o.  Admit date: 09/27/2019 Discharge date: 09/28/2019  Primary Care Physician: [place], Betty G, MD  Primary Cardiologist: Fransico Him, MD  Electrophysiologist: Dr. [name]  Primary Discharge Diagnosis:  Chronic systolic CHF LBBB   Secondary Discharge Diagnosis:  Permanent AF CKD III  Allergies  Allergen Reactions  . Ace Inhibitors     hyperkalemia  . Losartan     hyperkalemia  . Codeine Nausea Only  . Tramadol Nausea Only    Procedures This Admission:  1.  Implantation of a [employer] CRT-P on 09/27/2019 by Dr. [name].  The patient received a Sports coach number [id number] ([employer]) PPM with a new model number [id number] left ventricular lead, added to her chronic right atrial lead and right ventricular lead. There were no immediate post procedure complications. 2.  CXR on 09/28/19 demonstrated no pneumothorax status post device implantation.   Brief HPI: Molly [name] is a 85 y.o. female was followed in electrophysiology in the outpatient setting for management of her PPM device. She has chronic systolic CHF, and with pacing induced LBBB, decision made to upgrade her to CRT device.  Past medical history includes above.  The patient has had chronic systolic CHF and LBBB as above. Risks, benefits, and alternatives to PPM implantation/upgrade were reviewed with the patient who wished to proceed.   Hospital Course:  The patient was admitted and underwent upgrade of a [place] dual chamber PPM to a CRT-P system with details as outlined above.  She was monitored on telemetry overnight which demonstrated appropriate pacing and improved QRS on tele.  Left chest was without hematoma or ecchymosis.  The device was interrogated and found to be functioning normally.  CXR was obtained and demonstrated no pneumothorax status post device  implantation.  Wound care, arm mobility, and restrictions were reviewed with the patient.  The patient was examined and considered stable for discharge to home.   Pt will resume her coumadin TOMORROW, 09/29/2019 with coumadin check next week as below.   Physical Exam: Vitals:   09/28/19 0420 09/28/19 0534 09/28/19 0535 09/28/19 0551  BP: 115/66     Pulse: 70 70    Resp: 16 19    Temp:  (S) (!) 94.8 F (34.9 C) (S) (!) 95.4 F (35.2 C) (S) (!) 96.5 F (35.8 C)  TempSrc:  (S) Oral (S) Axillary (S) Rectal  SpO2: 91% 93%    Weight:      Height:        GEN- The patient is well appearing, alert and oriented x 3 today.   HEENT: normocephalic, atraumatic; sclera clear, conjunctiva pink; hearing intact; oropharynx clear; neck supple, no JVP Lymph- no cervical lymphadenopathy Lungs- Clear to ausculation bilaterally, normal work of breathing.  No wheezes, rales, rhonchi Heart- Regular rate and rhythm, no murmurs, rubs or gallops, PMI not laterally displaced GI- soft, non-tender, non-distended, bowel sounds present, no hepatosplenomegaly Extremities- no clubbing, cyanosis, or edema; DP/PT/radial pulses 2+ bilaterally MS- no significant deformity or atrophy Skin- warm and dry, no rash or lesion, left chest without hematoma. + ecchymosis where tape was placed on skin.  Psych- euthymic mood, full affect Neuro- strength and sensation are intact  Labs:   Lab Results  Component Value Date   WBC 5.3 09/06/2019   HGB 10.3 (L) 09/06/2019   HCT 32.5 (L) 09/06/2019   MCV 92 09/06/2019   PLT  162 09/06/2019   No results for input(s): NA, K, CL, CO2, BUN, CREATININE, CALCIUM, PROT, BILITOT, ALKPHOS, ALT, AST, GLUCOSE in the last 168 hours.  Invalid input(s): LABALBU  Discharge Medications:  Allergies as of 09/28/2019      Reactions   Ace Inhibitors    hyperkalemia   Losartan    hyperkalemia   Codeine Nausea Only   Tramadol Nausea Only      Medication List    TAKE these medications    acetaminophen 500 MG tablet Commonly known as: TYLENOL Take 1,000 mg by mouth 2 (two) times daily. For pain   dorzolamide 2 % ophthalmic solution Commonly known as: TRUSOPT Place 1 drop into the right eye 2 (two) times daily.   ferrous sulfate 325 (65 FE) MG tablet Take 325 mg by mouth every other day. In the morning   fluticasone 50 MCG/ACT nasal spray Commonly known as: FLONASE Place 2 sprays into both nostrils daily. What changed: when to take this   furosemide 40 MG tablet Commonly known as: LASIX Take 1 tablet (40 mg total) by mouth daily.   levothyroxine 50 MCG tablet Commonly known as: SYNTHROID Take 0.5 tablets (25 mcg total) by mouth daily. What changed: how much to take   LORazepam 0.5 MG tablet Commonly known as: ATIVAN TAKE 1 TABLET (0.5 MG TOTAL) BY MOUTH AT BEDTIME.   multivitamin with minerals Tabs tablet Take 1 tablet by mouth daily.   omeprazole 20 MG capsule Commonly known as: PRILOSEC Take 20 mg by mouth every evening.   warfarin 5 MG tablet Commonly known as: COUMADIN Take as directed. If you are unsure how to take this medication, talk to your nurse or doctor. Original instructions: Take 1/2 tablet daily or Take as directed by anticoagulation clinic Start taking on: September 29, 2019       Disposition:   Follow-up Information    Evans Lance, MD Follow up on 01/10/2020.   Specialty: Cardiology Why: at 2 pm for 3 month s/p pacemaker revision Contact information: [id number] [place] [address] [phone]        [facility] Follow up on 10/10/2019.   Why: at 3 pm for post pacemaker wound check Contact information: [place] [phone]       [facility] Follow up on 10/04/2019.   Specialty: Cardiology Why: at 330 pm for COUMADIN check Contact information: [address], [place] [phone]          Duration of Discharge Encounter: Greater than 30 minutes including physician time.  Jacalyn Lefevre, PA-C  09/28/2019 10:07 AM   EP Attending  Patient seen and examined. Agree with the findings as noted above. The patient is s/p upgrade of a DDD PM to a biv PPM and her CXR and device function looks good. Interrogation of her device under my direction demonstrates normal Biv PPM function. She will be discharged home with usual followup.   Mikle Bosworth.D.

## 2019-09-28 NOTE — Discharge Instructions (Signed)
After Your Pacemaker Procedure   You have a Chiropractor   Do not lift your arm above shoulder height for 1 week after your procedure. After 7 days, you may progress as below.     Wednesday October 04, 2019  Thursday October 05, 2019 Friday October 06, 2019 Saturday October 07, 2019    Do not lift, push, pull, or carry anything over 10 pounds with the affected arm until 6 weeks (Thursday November 09, 2019) after your procedure.    Do not drive until your wound check, or until instructed by your healthcare provider that you are safe to do so.    Monitor your pacemaker site for redness, swelling, and drainage. Call the device clinic at [phone] if you experience these symptoms or fever/chills.   If your incision is sealed with Steri-strips or staples, you may shower 7 days after your procedure. Do not remove the steri-strips or let the shower hit directly on your site. You may wash around your site with soap and water. If your incision is closed with Dermabond/Surgical glue. You may shower 1 day after your pacemaker implant and wash around the site with soap and water. Avoid lotions, ointments, or perfumes over your incision until it is well-healed.   You may use a hot tub or a pool AFTER your wound check appointment if the incision is completely closed.   Your Pacemaker may be MRI compatible. We will discuss this at your first follow up/wound check. .    Remote monitoring is used to monitor your pacemaker from home. This monitoring is scheduled every 91 days by our office. It allows [place] to keep an eye on the functioning of your device to ensure it is working properly. You will routinely see your Electrophysiologist annually (more often if necessary).    Pacemaker Implantation, Care After This sheet gives you information about how to care for yourself after your procedure. Your health care provider may also give you more specific instructions. If you have problems  or questions, contact your health care provider. What can I expect after the procedure? After the procedure, it is common to have:  Mild pain.  Slight bruising.  Some swelling over the incision.  A slight bump over the skin where the device was placed. Sometimes, it is possible to feel the device under the skin. This is normal.  You should received your Pacemaker ID card within 4-8 weeks. Follow these instructions at home: Medicines  Take over-the-counter and prescription medicines only as told by your health care provider.  If you were prescribed an antibiotic medicine, take it as told by your health care provider. Do not stop taking the antibiotic even if you start to feel better. Wound care     Do not remove the bandage on your chest until directed to do so by your health care provider.  After your bandage is removed, you may see pieces of tape called skin adhesive strips over the area where the cut was made (incision site). Let them fall off on their own.  Check the incision site every day to make sure it is not infected, bleeding, or starting to pull apart.  Do not use lotions or ointments near the incision site unless directed to do so.  Keep the incision area clean and dry for 7 days after the procedure or as directed by your health care provider. It takes several weeks for the incision site to completely heal.  Do not take baths, swim, or  use a hot tub for 7-10 days or as otherwise directed by your health care provider. Activity  Do not drive or use heavy machinery while taking prescription pain medicine.  Do not drive for 24 hours if you were given a medicine to help you relax (sedative).  Check with your health care provider before you start to drive or play sports.  Avoid sudden jerking, pulling, or chopping movements that pull your upper arm far away from your body. Avoid these movements for at least 6 weeks or as long as told by your health care provider.  Do  not lift your upper arm above your shoulders for at least 6 weeks or as long as told by your health care provider. This means no tennis, golf, or swimming.  You may go back to work when your health care provider says it is okay. Pacemaker care  You may be shown how to transfer data from your pacemaker through the phone to your health care provider.  Always let all health care providers know about your pacemaker before you have any medical procedures or tests.  Wear a medical ID bracelet or necklace stating that you have a pacemaker. Carry a pacemaker ID card with you at all times.  Your pacemaker battery will last for 5-15 years. Routine checks by your health care provider will let the health care provider know when the battery is starting to run down. The pacemaker will need to be replaced when the battery starts to run down.  Do not use amateur Chief of Staff. Other electrical devices are safe to use, including power tools, lawn mowers, and speakers. If you are unsure of whether something is safe to use, ask your health care provider.  When using your cell phone, hold it to the ear opposite the pacemaker. Do not leave your cell phone in a pocket over the pacemaker.  Avoid places or objects that have a strong electric or magnetic field, including: ? Airport [employer]bal[employer]. When at the airport, let officials know that you have a pacemaker. ? Power plants. ? Large electrical generators. ? Radiofrequency transmission towers, such as cell phone and radio towers. General instructions  Weigh yourself every day. If you suddenly gain weight, fluid may be building up in your body.  Keep all follow-up visits as told by your health care provider. This is important. Contact a health care provider if:  You gain weight suddenly.  Your legs or feet swell.  It feels like your heart is fluttering or skipping beats (heart palpitations).  You have chills or a  fever.  You have more redness, swelling, or pain around your incisions.  You have more fluid or blood coming from your incisions.  Your incisions feel warm to the touch.  You have pus or a bad smell coming from your incisions. Get help right away if:  You have chest pain.  You have trouble breathing or are short of breath.  You become extremely tired.  You are light-headed or you faint. This information is not intended to replace advice given to you by your health care provider. Make sure you discuss any questions you have with your health care provider.

## 2019-09-29 ENCOUNTER — Encounter (HOSPITAL_COMMUNITY): Payer: Self-pay | Admitting: Internal Medicine

## 2019-10-01 ENCOUNTER — Telehealth: Payer: Self-pay | Admitting: Cardiology

## 2019-10-01 NOTE — Telephone Encounter (Signed)
Patient called in reporting increased lower extremity edema over the past 3 to 4 days.  Has noticed some weeping in her left leg.  No significant shortness of breath, chest pain or weakness noted.  States her weight is up 2 pounds since Wednesday of last week.  She has not tried increasing her Lasix dose.  I advised she should take an extra 40 mg Lasix dose today.  We will send a message to the office to follow-up regarding symptoms and schedule for labs on Monday.  She was agreeable to this plan and thanked me for the call back.

## 2019-10-02 NOTE — Telephone Encounter (Signed)
Spoke with pt and since increase of Lasix no improvement to  legs and the left leg is weeping  Per pt and Anda Kraft  did not urinate much with increase in Lasix  No change in weight Per pt had taken Lasix 60 mg yesterday Pt is fearful to do more for long time due to kidney functions Appt made with VIN Bhagat PA  10/06/19 at 11:30 am per pt does not want to wait that long the only other appts are [facility] and pt needs to be seen in office  Will forward to Dr Radford Pax for review and recommendations ./cy

## 2019-10-02 NOTE — Telephone Encounter (Signed)
If we cannot get her in sooner in office with extender or DOD then recommend seeing her PCP

## 2019-10-02 NOTE — Telephone Encounter (Signed)
Please get her in with an extender this week

## 2019-10-02 NOTE — Telephone Encounter (Signed)
Pt aware of recommendations and will call PCP ofice for an appt ./cy

## 2019-10-02 NOTE — Telephone Encounter (Signed)
Follow Up  Pt c/o swelling: STAT is pt has developed SOB within 24 hours  1) How much weight have you gained and in what time span? Around 3lbs; from 12/5/-12/7  2) If swelling, where is the swelling located? Legs  3) Are you currently taking a fluid pill? furosemide (LASIX) 40 MG tablet  4) Are you currently SOB? No; get up and walk gets short of breath  5) Do you have a log of your daily weights (if so, list)? Yes  6) Have you gained 3 pounds in a day or 5 pounds in a week? No  7) Have you traveled recently? No

## 2019-10-03 DIAGNOSIS — Z85828 Personal history of other malignant neoplasm of skin: Secondary | ICD-10-CM | POA: Diagnosis not present

## 2019-10-03 DIAGNOSIS — N183 Chronic kidney disease, stage 3 unspecified: Secondary | ICD-10-CM | POA: Diagnosis not present

## 2019-10-03 DIAGNOSIS — I080 Rheumatic disorders of both mitral and aortic valves: Secondary | ICD-10-CM | POA: Diagnosis not present

## 2019-10-03 DIAGNOSIS — Z89421 Acquired absence of other right toe(s): Secondary | ICD-10-CM | POA: Diagnosis not present

## 2019-10-03 DIAGNOSIS — Z9181 History of falling: Secondary | ICD-10-CM | POA: Diagnosis not present

## 2019-10-03 DIAGNOSIS — M47815 Spondylosis without myelopathy or radiculopathy, thoracolumbar region: Secondary | ICD-10-CM | POA: Diagnosis not present

## 2019-10-03 DIAGNOSIS — Z950 Presence of cardiac pacemaker: Secondary | ICD-10-CM | POA: Diagnosis not present

## 2019-10-03 DIAGNOSIS — G8929 Other chronic pain: Secondary | ICD-10-CM | POA: Diagnosis not present

## 2019-10-03 DIAGNOSIS — Z7901 Long term (current) use of anticoagulants: Secondary | ICD-10-CM | POA: Diagnosis not present

## 2019-10-03 DIAGNOSIS — E785 Hyperlipidemia, unspecified: Secondary | ICD-10-CM | POA: Diagnosis not present

## 2019-10-03 DIAGNOSIS — I5043 Acute on chronic combined systolic (congestive) and diastolic (congestive) heart failure: Secondary | ICD-10-CM | POA: Diagnosis not present

## 2019-10-03 DIAGNOSIS — I420 Dilated cardiomyopathy: Secondary | ICD-10-CM | POA: Diagnosis not present

## 2019-10-03 DIAGNOSIS — F419 Anxiety disorder, unspecified: Secondary | ICD-10-CM | POA: Diagnosis not present

## 2019-10-03 DIAGNOSIS — M545 Low back pain: Secondary | ICD-10-CM | POA: Diagnosis not present

## 2019-10-03 DIAGNOSIS — I495 Sick sinus syndrome: Secondary | ICD-10-CM | POA: Diagnosis not present

## 2019-10-03 DIAGNOSIS — I130 Hypertensive heart and chronic kidney disease with heart failure and stage 1 through stage 4 chronic kidney disease, or unspecified chronic kidney disease: Secondary | ICD-10-CM | POA: Diagnosis not present

## 2019-10-03 DIAGNOSIS — K219 Gastro-esophageal reflux disease without esophagitis: Secondary | ICD-10-CM | POA: Diagnosis not present

## 2019-10-03 DIAGNOSIS — I4819 Other persistent atrial fibrillation: Secondary | ICD-10-CM | POA: Diagnosis not present

## 2019-10-04 ENCOUNTER — Ambulatory Visit: Payer: Medicare Other

## 2019-10-05 ENCOUNTER — Encounter: Payer: Self-pay | Admitting: Cardiology

## 2019-10-05 ENCOUNTER — Telehealth: Payer: Self-pay | Admitting: Cardiology

## 2019-10-05 NOTE — Telephone Encounter (Signed)
error 

## 2019-10-05 NOTE — Progress Notes (Signed)
Cardiology Office Note    Date:  10/06/2019   ID:  Molly [name], DOB 1933/03/08, MRN [id number]  PCP:  [place], Betty G, MD  Primary Cardiologist: Fransico Him, MD  Electrophysiologist: Dr. [name]  Chief Complaint: LE edema  History of Present Illness:   Molly [name] is a 85 y.o. female with hx of chronic systolic and diastolic CHF (presume NICM), tachy-brady syndrome s/p PPM implant 2014, persistent atrial fibrillations/p recent AVN ablation,CKD stage III, hyperparathyroidism,  pacing induced LBBB s/p  [employer] CRT-P upgrade on 09/27/2019, chronic afib presents for LE edema.   She has long history of atrial fib going back many years and required PPM as above. In 2017 her echo showed reduction in EF to 40-45% with mild AI and mild-moderate MR, so nuclear stress test was pursued which showed no ischemia. Most recent echocardiogram in 05/26/19 showed further decline in EF to 30-35%, normal RVSP, moderate LAE, mild RAE, moderate mitral regurgitation, mild aortic regurgitation.Her afib has been difficult to control and she has been followed by EP and the afib clinic. She failedamiodarone, Tikosyn and sotalol in the past.She wasnot felt to be a good candidate for afib ablation given her severely dilated LA. She was then admitted 05/25/19 with worsening fatigue, dyspnea, and weight gain with mildly elevated BNP and uncontrolled atrial arrhythmias. She was treated with IV Lasix and underwentAV nodeablation 8/3/20and therefore HR is now dictated by her pacemaker. Her Lasix was stopped due to AKI. She has history of hyperkalemia with ACEI, ARB, and spironolactone and blood pressure has otherwise been prohibitive of aggressive med titration. She continued to volume overload despite above tx. Eventually underwent  [employer] CRT-P upgrade on 09/27/2019. She was also evaluated by Pulmonary for cough.   Called 12/6 with increased LE edema. Increase lasix. Here today for further  evaluation. No improvement. Has whooping edema on LLE. Feels abdominal tightness and has orthopnea. Breathing stable. Compliant with medication and low sodium. Reports about 8lb weight gain.   Past Medical History:  Diagnosis Date  . Aortic insufficiency   . Atrial tachycardia ([place])   . Cancer ([place])    Skin cancer- basal.  1 mylenoma  . Chronic combined systolic and diastolic CHF (congestive heart failure) ([place])   . CKD (chronic kidney disease), stage III   . Complication of anesthesia   . DCM (dilated cardiomyopathy) ([place])    EF 40-45% by echo 2017 - nuclear stress test with no ischemia  . GERD (gastroesophageal reflux disease)   . H/O benign essential tremor    on amiodarone resolved off amio  . H/O cardiac radiofrequency ablation    a. AV node ablation in 05/2019.  Molly [name] H/O hyperkalemia    Resolved off ACE inhibitors  . High cholesterol   . History of blood transfusion   . Hyperkalemia   . Hyperparathyroidism ([place])   . Hypertension   . Mitral regurgitation   . Persistent atrial fibrillation ([place])   . PONV (postoperative nausea and vomiting)   . PVC's (premature ventricular contractions)   . Tachycardia-bradycardia syndrome [facility])    s/p PPM 02/2013    Past Surgical History:  Procedure Laterality Date  . AMPUTATION Right 11/27/2014   Procedure: RIGHT 2ND AND 3RD TOE AMPUTATION;  Surgeon: Wylene Simmer, MD;  Location: [place];  Service: Orthopedics;  Laterality: Right;  . ATRIAL FIBRILLATION ABLATION N/A 05/29/2019   Procedure: AVN ABLATION;  Surgeon: Evans Lance, MD;  Location: [place] CV LAB;  Service: Cardiovascular;  Laterality: N/A;  . BIV UPGRADE N/A 09/27/2019   Procedure: BIV UPGRADE;  Surgeon: Evans Lance, MD;  Location: [place] CV LAB;  Service: Cardiovascular;  Laterality: N/A;  . BUNIONECTOMY     Right  . CARDIAC CATHETERIZATION  09/25/2000   EF of 50% -- with normal left ventricular size and function  . CARDIOVERSION N/A 12/28/2017   Procedure:  CARDIOVERSION;  Surgeon: Dorothy Spark, MD;  Location: [facility] ENDOSCOPY;  Service: Cardiovascular;  Laterality: N/A;  . CARDIOVERSION N/A 02/25/2018   Procedure: CARDIOVERSION;  Surgeon: Skeet Latch, MD;  Location: [place];  Service: Cardiovascular;  Laterality: N/A;  . CESAREAN SECTION    . CESAREAN SECTION    . EYE SURGERY Bilateral    Cataract  . FOOT SURGERY    . INSERT / REPLACE / REMOVE PACEMAKER  02/2013  . PACEMAKER INSERTION  02/2013  . PERMANENT PACEMAKER INSERTION N/A 03/02/2013   Procedure: PERMANENT PACEMAKER INSERTION;  Surgeon: Evans Lance, MD;  Location: [facility] CATH LAB;  Service: Cardiovascular;  Laterality: N/A;  . SHOULDER ARTHROSCOPY W/ ROTATOR CUFF REPAIR    . SHOULDER SURGERY Right    roto cuff  . TOE AMPUTATION Left    2nd and 3rd, bunion under toes.  . TONSILLECTOMY    . VARICOSE VEIN SURGERY      Current Medications: Prior to Admission medications   Medication Sig Start Date End Date Taking? Authorizing Provider  acetaminophen (TYLENOL) 500 MG tablet Take 1,000 mg by mouth 2 (two) times daily. For pain     [provider]  dorzolamide (TRUSOPT) 2 % ophthalmic solution Place 1 drop into the right eye 2 (two) times daily. 06/22/12   [provider]  ferrous sulfate 325 (65 FE) MG tablet Take 325 mg by mouth every other day. In the morning    [provider]  fluticasone (FLONASE) 50 MCG/ACT nasal spray Place 2 sprays into both nostrils daily. Patient taking differently: Place 2 sprays into both nostrils every evening.  07/13/19   Julian Hy, DO  furosemide (LASIX) 40 MG tablet Take 1 tablet (40 mg total) by mouth daily. 09/13/19 12/12/19  Evans Lance, MD  levothyroxine (SYNTHROID) 50 MCG tablet Take 0.5 tablets (25 mcg total) by mouth daily. Patient taking differently: Take 50 mcg by mouth daily.  08/24/19   [place], Betty G, MD  LORazepam (ATIVAN) 0.5 MG tablet TAKE 1 TABLET (0.5 MG TOTAL) BY MOUTH AT BEDTIME. 09/04/19    [place], Betty G, MD  Multiple Vitamin (MULTIVITAMIN WITH MINERALS) TABS Take 1 tablet by mouth daily.    [provider]  omeprazole (PRILOSEC) 20 MG capsule Take 20 mg by mouth every evening.     [provider]  warfarin (COUMADIN) 5 MG tablet Take 1/2 tablet daily or Take as directed by anticoagulation clinic 09/29/19   Shirley Friar, PA-C    Allergies:   Ace inhibitors, Losartan, Codeine, and Tramadol   Social History   Socioeconomic History  . Marital status: Married    Spouse name: Not on file  . Number of children: 3  . Years of education: Not on file  . Highest education level: Not on file  Occupational History  . Not on file  Tobacco Use  . Smoking status: Never Smoker  . Smokeless tobacco: Never Used  Substance and Sexual Activity  . Alcohol use: No  . Drug use: No  . Sexual activity: Not Currently  Other Topics Concern  . Not  on file  Social History Narrative  . Not on file   Social Determinants of Health   Financial Resource Strain:   . Difficulty of Paying Living Expenses: Not on file  Food Insecurity:   . Worried About Charity fundraiser in the Last Year: Not on file  . Ran Out of Food in the Last Year: Not on file  Transportation Needs:   . Lack of Transportation (Medical): Not on file  . Lack of Transportation (Non-Medical): Not on file  Physical Activity:   . Days of Exercise per Week: Not on file  . Minutes of Exercise per Session: Not on file  Stress:   . Feeling of Stress : Not on file  Social Connections:   . Frequency of Communication with Friends and Family: Not on file  . Frequency of Social Gatherings with Friends and Family: Not on file  . Attends Religious Services: Not on file  . Active Member of Clubs or Organizations: Not on file  . Attends Archivist Meetings: Not on file  . Marital Status: Not on file     Family History:  The patient's family history includes Breast cancer in her sister;  Hypertension in her father and mother.  ROS:   Please see the history of present illness.    ROS All other systems reviewed and are negative.   PHYSICAL EXAM:   VS:  BP 124/72   Pulse 71   Ht 5\' 5"  (1.651 m)   Wt 193 lb 12.8 oz (87.9 kg)   SpO2 98%   BMI 32.25 kg/m    GEN: Well nourished, well developed, in no acute distress  HEENT: normal  Neck: no carotid bruits, or masses; + JVD Cardiac: RRR; no murmurs, rubs, or gallops,2 + BL LE edema with dressing on L LE  Respiratory:  clear to auscultation bilaterally, normal work of breathing GI: soft, nontender, nondistended, + BS MS: no deformity or atrophy  Skin: warm and dry, no rash Neuro:  Alert and Oriented x 3, Strength and sensation are intact Psych: euthymic mood, full affect  Wt Readings from Last 3 Encounters:  10/06/19 193 lb 12.8 oz (87.9 kg)  09/27/19 187 lb (84.8 kg)  09/13/19 191 lb (86.6 kg)      Studies/Labs Reviewed:   EKG:  EKG is not ordered today.    Recent Labs: 05/28/2019: Magnesium 1.8 06/29/2019: NT-Pro BNP 3,228 09/06/2019: ALT 16; BNP 381.0; Hemoglobin 10.3; Platelets 162; TSH 6.510 09/13/2019: BUN 22; Creatinine, Ser 1.18; Potassium 4.5; Sodium 142   Lipid Panel    Component Value Date/Time   CHOL 156 05/23/2012 0514   TRIG 86 05/23/2012 0514   HDL 80 05/23/2012 0514   CHOLHDL 2.0 05/23/2012 0514   VLDL 17 05/23/2012 0514   LDLCALC 59 05/23/2012 0514    Additional studies/ records that were reviewed today include:   Echocardiogram: 04/2019  1. The left ventricle has moderate-severely reduced systolic function, with an ejection fraction of 30-35%. The cavity size was normal. Left ventricular diastolic function could not be evaluated secondary to atrial fibrillation. Left ventricular diffuse  hypokinesis.  2. The right ventricle has normal systolic function. The cavity was normal. There is no increase in right ventricular wall thickness. Right ventricular systolic pressure is normal.  3. Left  atrial size was moderately dilated.  4. Right atrial size was mildly dilated.  5. There is mild mitral annular calcification present. Mitral valve regurgitation is moderate by color flow Doppler.  6. The  aortic valve is tricuspid. Mild thickening of the aortic valve. Mild calcification of the aortic valve. Aortic valve regurgitation is mild by color flow Doppler. No stenosis of the aortic valve.  7. The aorta is normal in size and structure.  8. The aortic root, ascending aorta and aortic arch are normal in size and structure.   ASSESSMENT & PLAN:    1. Acute on chronic combined CHF Significant volume overloaded on exam. She states her baseline weight is 185lb. 193lb today. Discussed with Dr. Harrington Challenger. Increase lasix to 40mg  BID with labs today and repeat in 10 days. ACE wrap and leg elevation.   2. SSS s/p CRTP un grade last week - Followed by Dr. [name]    3. CKD III - Check lab  4. Anemia - Check CBC  5.  persistent atrial fibrillations/p recent AVN ablation - Coumadin followed by PCP    Medication Adjustments/Labs and Tests Ordered: Current medicines are reviewed at length with the patient today.  Concerns regarding medicines are outlined above.  Medication changes, Labs and Tests ordered today are listed in the Patient Instructions below. Patient Instructions  Medication Instructions:  1. INCREASE LASIX TO 40 MG TWICE DAILY; NEW RX HAS BEEN SENT 2. START POTASSIUM 10 MEQ DAILY; NEW RX HAS BEEN SENT   *If you need a refill on your cardiac medications before your next appointment, please call your pharmacy*  Lab Work: 1. TODAY BMET, PRO BNP  2. 10/16/19 YOU WILL NEED REPEAT BMET, PRO BNP If you have labs (blood work) drawn today and your tests are completely normal, you will receive your results only by: Molly [name] MyChart Message (if you have MyChart) OR . A paper copy in the mail If you have any lab test that is abnormal or we need to change your treatment, we will call you to  review the results.  Testing/Procedures: NONE ORDERED TODAY  Follow-Up: At [facility], you and your health needs are our priority.  As part of our continuing mission to provide you with exceptional heart care, we have created designated Provider Care Teams.  These Care Teams include your primary Cardiologist (physician) and Advanced Practice Providers (APPs -  Physician Assistants and Nurse Practitioners) who all work together to provide you with the care you need, when you need it.  Your next appointment:   11/02/19 @ 3 PM   The format for your next appointment:   In Person  Provider:   Cecilie Kicks, NP  Other Instructions  Fluid Restriction With some health conditions, you must restrict your fluid intake. This means that you need to limit the amount of fluid that you drink each day (fluid restriction). When you have a fluid restriction, you must carefully measure and keep track of the amount of fluid that you drink. Your health care provider will identify the specific amount of fluid you are allowed each day (fluid allowance). This amount may depend on several things, such as:  How well your kidneys function.  How much fluid you are keeping (retaining) in your body tissues.  Your blood pressure.  Your heart function.  Your blood sodium level. What is my plan? Your health care provider recommends that you limit your fluid intake to __________ per day. What counts toward my fluid intake? Your fluid intake includes all liquids that you drink, as well as any foods that become liquid at room temperature. The following are examples of some fluids that you will have to restrict:  Tea, coffee, soda, lemonade,  milk, water, juice, sports drinks, and nutritional supplement beverages.  Alcoholic beverages.  Cream.  Gravy.  Ice cubes.  Soup and broth. The following are examples of foods that become liquid at room temperature. These foods will also count toward your fluid  intake.  Ice cream and ice milk.  Frozen yogurt and sherbet.  Frozen ice pops.  Flavored gelatin. How do I keep track of my fluid intake? Each morning, fill a jug with the amount of water that is equal to your daily fluid allowance. You can use this water as a guideline for fluid allowance. Each time you take in any form of fluid (including ice cubes and foods that become liquid at room temperature), pour an equal amount of water out of the container. This helps you to see how much fluid you are taking in. It also helps you to see how much more fluid you can take in during the rest of the day. The following conversions may also be helpful in measuring your fluid intake:  1 cup equals 8 oz (240 mL).   cup equals 6 oz (180 mL).  ? cup equals 5? oz (160 mL).   cup equals 4 oz (120 mL).  ? cup equals 2? oz (80 mL).   cup equals 2 oz (60 mL).  2 Tbsp equals 1 oz (30 mL). What are tips for following this plan? General instructions  Make sure that you stay within your recommended fluid allowance each day. Always measure and keep track of your fluids (including ice cubes and foods that become liquid at room temperature).  Use small cups and glasses and learn to sip fluids slowly.  Try frozen fruits between meals, such as grapes or strawberries. These can satisfy thirst without adding to your fluid intake.  Swallow your pills along with meals or soft foods such as applesauce or mashed potatoes, instead of with liquids. Doing this helps you to save your fluid allowance for something that you enjoy. Weigh yourself each day     Weigh yourself every day. Keeping track of your daily weight can help you and your health care provider to notice as soon as possible if you are retaining too much fluid in your body.  Follow this sequence every morning: 1. Urinate. 2. Weigh yourself. 3. Eat breakfast.  Wear the same amount of clothing each time you weigh yourself.  Write down your  daily weight. Give this weight record to your health care provider. If your weight is going up, you may be retaining too much fluid. Every 1 lb (0.45 kg) of body weight that you gain is a sign that your body is retaining 2 cups (480 mL) of fluid.  Manage your thirst  Add lemon juice or a slice of fresh lemon to water or ice. Doing this helps to satisfy your thirst.  Freeze fruit juice or water in an ice cube tray. Use this as part of your fluid allowance. These cubes are useful for quenching your thirst. Before you freeze the juice or water, measure how much liquid you use to fill a cube section of the ice tray. Subtract this amount from your day's allowance each time you consume a frozen cube.  Avoid salty (high-sodium) foods. These foods make you thirsty and make it more difficult to stay within your daily fluid allowance.  Keep the temperature in your home at a cooler level.  Keep the air in your home as humid as possible. Dry air increases thirst.  Avoid being  out in the hot sun, which can cause you to sweat and become thirsty.  To help avoid dry mouth, brush your teeth often or rinse out your mouth with mouthwash. Lemon wedges, hard sour candies, chewing gum, or breath spray may also help to moisten your mouth. What are some signs that I may be taking in too much fluid? You may be taking in too much fluid if:  Your weight increases. Contact your health care provider if you gain weight rapidly.  Your face, hands, legs, feet, and abdomen start to swell.  You have trouble breathing. Summary  With some health conditions, you must limit (restrict) your fluid intake. This means that you need to limit the amount of fluid you drink each day (fluid restriction). Your health care provider will identify the specific amount of fluid that you are allowed each day.  When you have a fluid restriction, you must carefully measure and keep track of the amount of fluid that you drink.  Your fluid  intake includes all liquids that you drink, as well as any foods that become liquid at room temperature (such as ice cream and gelatin).  You may be taking in too much fluid if your weight increases, your body starts to swell, or you have trouble breathing. This information is not intended to replace advice given to you by your health care provider. Make sure you discuss any questions you have with your health care provider. Document Released: 08/09/2007 Document Revised: 02/02/2019 Document Reviewed: 06/16/2017 Elsevier Patient Education  [place].  Low-Sodium Eating Plan Sodium, which is an element that makes up salt, helps you maintain a healthy balance of fluids in your body. Too much sodium can increase your blood pressure and cause fluid and waste to be held in your body. Your health care provider or dietitian may recommend following this plan if you have high blood pressure (hypertension), kidney disease, liver disease, or heart failure. Eating less sodium can help lower your blood pressure, reduce swelling, and protect your heart, liver, and kidneys. What are tips for following this plan? General guidelines  Most people on this plan should limit their sodium intake to 1,500-2,000 mg (milligrams) of sodium each day. Reading food labels   The Nutrition Facts label lists the amount of sodium in one serving of the food. If you eat more than one serving, you must multiply the listed amount of sodium by the number of servings.  Choose foods with less than 140 mg of sodium per serving.  Avoid foods with 300 mg of sodium or more per serving. Shopping  Look for lower-sodium products, often labeled as "low-sodium" or "no salt added."  Always check the sodium content even if foods are labeled as "unsalted" or "no salt added".  Buy fresh foods. ? Avoid canned foods and premade or frozen meals. ? Avoid canned, cured, or processed meats  Buy breads that have less than 80 mg of  sodium per slice. Cooking  Eat more home-cooked food and less restaurant, buffet, and fast food.  Avoid adding salt when cooking. Use salt-free seasonings or herbs instead of table salt or sea salt. Check with your health care provider or pharmacist before using salt substitutes.  Cook with plant-based oils, such as canola, sunflower, or olive oil. Meal planning  When eating at a restaurant, ask that your food be prepared with less salt or no salt, if possible.  Avoid foods that contain MSG (monosodium glutamate). MSG is sometimes added to [place],  bouillon, and some canned foods. What foods are recommended? The items listed may not be a complete list. Talk with your dietitian about what dietary choices are best for you. Grains Low-sodium cereals, including oats, puffed wheat and rice, and shredded wheat. Low-sodium crackers. Unsalted rice. Unsalted pasta. Low-sodium bread. Whole-grain breads and whole-grain pasta. Vegetables Fresh or frozen vegetables. "No salt added" canned vegetables. "No salt added" tomato sauce and paste. Low-sodium or reduced-sodium tomato and vegetable juice. Fruits Fresh, frozen, or canned fruit. Fruit juice. Meats and other protein foods Fresh or frozen (no salt added) meat, poultry, seafood, and fish. Low-sodium canned tuna and salmon. Unsalted nuts. Dried peas, beans, and lentils without added salt. Unsalted canned beans. Eggs. Unsalted nut butters. Dairy Milk. Soy milk. Cheese that is naturally low in sodium, such as ricotta cheese, fresh mozzarella, or Swiss cheese Low-sodium or reduced-sodium cheese. Cream cheese. Yogurt. Fats and oils Unsalted butter. Unsalted margarine with no trans fat. Vegetable oils such as canola or olive oils. Seasonings and other foods Fresh and dried herbs and spices. Salt-free seasonings. Low-sodium mustard and ketchup. Sodium-free salad dressing. Sodium-free light mayonnaise. Fresh or refrigerated horseradish. Lemon juice.  Vinegar. Homemade, reduced-sodium, or low-sodium soups. Unsalted popcorn and pretzels. Low-salt or salt-free chips. What foods are not recommended? The items listed may not be a complete list. Talk with your dietitian about what dietary choices are best for you. Grains Instant hot cereals. Bread stuffing, pancake, and biscuit mixes. Croutons. Seasoned rice or pasta mixes. Noodle soup cups. Boxed or frozen macaroni and cheese. Regular salted crackers. Self-rising flour. Vegetables Sauerkraut, pickled vegetables, and relishes. Olives. [place] fries. Onion rings. Regular canned vegetables (not low-sodium or reduced-sodium). Regular canned tomato sauce and paste (not low-sodium or reduced-sodium). Regular tomato and vegetable juice (not low-sodium or reduced-sodium). Frozen vegetables in sauces. Meats and other protein foods Meat or fish that is salted, canned, smoked, spiced, or pickled. Bacon, ham, sausage, hotdogs, corned beef, chipped beef, packaged lunch meats, salt pork, jerky, pickled herring, anchovies, regular canned tuna, sardines, salted nuts. Dairy Processed cheese and cheese spreads. Cheese curds. Blue cheese. Feta cheese. String cheese. Regular cottage cheese. Buttermilk. Canned milk. Fats and oils Salted butter. Regular margarine. Ghee. Bacon fat. Seasonings and other foods Onion salt, garlic salt, seasoned salt, table salt, and sea salt. Canned and packaged gravies. Worcestershire sauce. Tartar sauce. Barbecue sauce. Teriyaki sauce. Soy sauce, including reduced-sodium. Steak sauce. Fish sauce. Oyster sauce. Cocktail sauce. Horseradish that you find on the shelf. Regular ketchup and mustard. Meat flavorings and tenderizers. Bouillon cubes. Hot sauce and Tabasco sauce. Premade or packaged marinades. Premade or packaged taco seasonings. Relishes. Regular salad dressings. Salsa. Potato and tortilla chips. Corn chips and puffs. Salted popcorn and pretzels. Canned or dried soups. Pizza. Frozen  entrees and pot pies. Summary  Eating less sodium can help lower your blood pressure, reduce swelling, and protect your heart, liver, and kidneys.  Most people on this plan should limit their sodium intake to 1,500-2,000 mg (milligrams) of sodium each day.  Canned, boxed, and frozen foods are high in sodium. Restaurant foods, fast foods, and pizza are also very high in sodium. You also get sodium by adding salt to food.  Try to cook at home, eat more fresh fruits and vegetables, and eat less fast food, canned, processed, or prepared foods. This information is not intended to replace advice given to you by your health care provider. Make sure you discuss any questions you have with your health care provider. Document  Released: 04/03/2002 Document Revised: 09/24/2017 Document Reviewed: 10/05/2016 Elsevier Patient Education  2020 [place], [place], [place]  10/06/2019 12:46 PM    [facility] [place], [place], [place]  [address] Phone: [phone]; Fax: [phone]

## 2019-10-05 NOTE — Telephone Encounter (Signed)
Attempted to call the pt but no answer after letting it ring several times. Will try again this afternoon.

## 2019-10-05 NOTE — Telephone Encounter (Signed)
  Patient spoke with Devra Dopp on Monday in regards to her swelling issues and was set up with an appointment for tomorrow at 11:30 am with Bhagat. She is calling back today because she is not getting any better and cannot sleep at night. She wants to know what she can do. She also states that she will need her husband to come with her to the visit because she will have to be in a wheelchair because she cannot walk due to the swelling.

## 2019-10-06 ENCOUNTER — Ambulatory Visit (INDEPENDENT_AMBULATORY_CARE_PROVIDER_SITE_OTHER): Payer: Medicare Other | Admitting: *Deleted

## 2019-10-06 ENCOUNTER — Encounter: Payer: Self-pay | Admitting: Physician Assistant

## 2019-10-06 ENCOUNTER — Other Ambulatory Visit: Payer: Self-pay

## 2019-10-06 ENCOUNTER — Ambulatory Visit (INDEPENDENT_AMBULATORY_CARE_PROVIDER_SITE_OTHER): Payer: Medicare Other | Admitting: Physician Assistant

## 2019-10-06 VITALS — BP 124/72 | HR 71 | Ht 65.0 in | Wt 193.8 lb

## 2019-10-06 DIAGNOSIS — I5043 Acute on chronic combined systolic (congestive) and diastolic (congestive) heart failure: Secondary | ICD-10-CM | POA: Diagnosis not present

## 2019-10-06 DIAGNOSIS — I495 Sick sinus syndrome: Secondary | ICD-10-CM | POA: Diagnosis not present

## 2019-10-06 DIAGNOSIS — I10 Essential (primary) hypertension: Secondary | ICD-10-CM

## 2019-10-06 DIAGNOSIS — I4819 Other persistent atrial fibrillation: Secondary | ICD-10-CM | POA: Diagnosis not present

## 2019-10-06 DIAGNOSIS — I351 Nonrheumatic aortic (valve) insufficiency: Secondary | ICD-10-CM

## 2019-10-06 DIAGNOSIS — Z7901 Long term (current) use of anticoagulants: Secondary | ICD-10-CM | POA: Diagnosis not present

## 2019-10-06 DIAGNOSIS — D649 Anemia, unspecified: Secondary | ICD-10-CM | POA: Diagnosis not present

## 2019-10-06 DIAGNOSIS — I340 Nonrheumatic mitral (valve) insufficiency: Secondary | ICD-10-CM | POA: Diagnosis not present

## 2019-10-06 LAB — POCT INR: INR: 1.6 — AB (ref 2.0–3.0)

## 2019-10-06 MED ORDER — POTASSIUM CHLORIDE ER 10 MEQ PO TBCR: 10.0000 meq | EXTENDED_RELEASE_TABLET | Freq: Every day | ORAL | 3 refills | Status: DC

## 2019-10-06 MED ORDER — FUROSEMIDE 40 MG PO TABS: 40.0000 mg | ORAL_TABLET | Freq: Two times a day (BID) | ORAL | 3 refills | Status: DC

## 2019-10-06 NOTE — Patient Instructions (Addendum)
Description   Today take 1 tablet then continue taking 1/2 tablet daily.  Recheck in 1 week.

## 2019-10-06 NOTE — Telephone Encounter (Signed)
Pt saw B. Bhagat, PA today

## 2019-10-06 NOTE — Patient Instructions (Signed)
Medication Instructions:  1. INCREASE LASIX TO 40 MG TWICE DAILY; NEW RX HAS BEEN SENT 2. START POTASSIUM 10 MEQ DAILY; NEW RX HAS BEEN SENT   *If you need a refill on your cardiac medications before your next appointment, please call your pharmacy*  Lab Work: 1. TODAY BMET, PRO BNP  2. 10/16/19 YOU WILL NEED REPEAT BMET, PRO BNP If you have labs (blood work) drawn today and your tests are completely normal, you will receive your results only by: Marland Kitchen MyChart Message (if you have MyChart) OR . A paper copy in the mail If you have any lab test that is abnormal or we need to change your treatment, we will call you to review the results.  Testing/Procedures: NONE ORDERED TODAY  Follow-Up: At [facility], you and your health needs are our priority.  As part of our continuing mission to provide you with exceptional heart care, we have created designated Provider Care Teams.  These Care Teams include your primary Cardiologist (physician) and Advanced Practice Providers (APPs -  Physician Assistants and Nurse Practitioners) who all work together to provide you with the care you need, when you need it.  Your next appointment:   11/02/19 @ 3 PM   The format for your next appointment:   In Person  Provider:   Cecilie Kicks, NP  Other Instructions  Fluid Restriction With some health conditions, you must restrict your fluid intake. This means that you need to limit the amount of fluid that you drink each day (fluid restriction). When you have a fluid restriction, you must carefully measure and keep track of the amount of fluid that you drink. Your health care provider will identify the specific amount of fluid you are allowed each day (fluid allowance). This amount may depend on several things, such as:  How well your kidneys function.  How much fluid you are keeping (retaining) in your body tissues.  Your blood pressure.  Your heart function.  Your blood sodium level. What is my  plan? Your health care provider recommends that you limit your fluid intake to __________ per day. What counts toward my fluid intake? Your fluid intake includes all liquids that you drink, as well as any foods that become liquid at room temperature. The following are examples of some fluids that you will have to restrict:  Tea, coffee, soda, lemonade, milk, water, juice, sports drinks, and nutritional supplement beverages.  Alcoholic beverages.  Cream.  Gravy.  Ice cubes.  Soup and broth. The following are examples of foods that become liquid at room temperature. These foods will also count toward your fluid intake.  Ice cream and ice milk.  Frozen yogurt and sherbet.  Frozen ice pops.  Flavored gelatin. How do I keep track of my fluid intake? Each morning, fill a jug with the amount of water that is equal to your daily fluid allowance. You can use this water as a guideline for fluid allowance. Each time you take in any form of fluid (including ice cubes and foods that become liquid at room temperature), pour an equal amount of water out of the container. This helps you to see how much fluid you are taking in. It also helps you to see how much more fluid you can take in during the rest of the day. The following conversions may also be helpful in measuring your fluid intake:  1 cup equals 8 oz (240 mL).   cup equals 6 oz (180 mL).  ? cup equals  5? oz (160 mL).   cup equals 4 oz (120 mL).  ? cup equals 2? oz (80 mL).   cup equals 2 oz (60 mL).  2 Tbsp equals 1 oz (30 mL). What are tips for following this plan? General instructions  Make sure that you stay within your recommended fluid allowance each day. Always measure and keep track of your fluids (including ice cubes and foods that become liquid at room temperature).  Use small cups and glasses and learn to sip fluids slowly.  Try frozen fruits between meals, such as grapes or strawberries. These can satisfy  thirst without adding to your fluid intake.  Swallow your pills along with meals or soft foods such as applesauce or mashed potatoes, instead of with liquids. Doing this helps you to save your fluid allowance for something that you enjoy. Weigh yourself each day     Weigh yourself every day. Keeping track of your daily weight can help you and your health care provider to notice as soon as possible if you are retaining too much fluid in your body.  Follow this sequence every morning: 1. Urinate. 2. Weigh yourself. 3. Eat breakfast.  Wear the same amount of clothing each time you weigh yourself.  Write down your daily weight. Give this weight record to your health care provider. If your weight is going up, you may be retaining too much fluid. Every 1 lb (0.45 kg) of body weight that you gain is a sign that your body is retaining 2 cups (480 mL) of fluid.  Manage your thirst  Add lemon juice or a slice of fresh lemon to water or ice. Doing this helps to satisfy your thirst.  Freeze fruit juice or water in an ice cube tray. Use this as part of your fluid allowance. These cubes are useful for quenching your thirst. Before you freeze the juice or water, measure how much liquid you use to fill a cube section of the ice tray. Subtract this amount from your day's allowance each time you consume a frozen cube.  Avoid salty (high-sodium) foods. These foods make you thirsty and make it more difficult to stay within your daily fluid allowance.  Keep the temperature in your home at a cooler level.  Keep the air in your home as humid as possible. Dry air increases thirst.  Avoid being out in the hot sun, which can cause you to sweat and become thirsty.  To help avoid dry mouth, brush your teeth often or rinse out your mouth with mouthwash. Lemon wedges, hard sour candies, chewing gum, or breath spray may also help to moisten your mouth. What are some signs that I may be taking in too much  fluid? You may be taking in too much fluid if:  Your weight increases. Contact your health care provider if you gain weight rapidly.  Your face, hands, legs, feet, and abdomen start to swell.  You have trouble breathing. Summary  With some health conditions, you must limit (restrict) your fluid intake. This means that you need to limit the amount of fluid you drink each day (fluid restriction). Your health care provider will identify the specific amount of fluid that you are allowed each day.  When you have a fluid restriction, you must carefully measure and keep track of the amount of fluid that you drink.  Your fluid intake includes all liquids that you drink, as well as any foods that become liquid at room temperature (such as ice cream  and gelatin).  You may be taking in too much fluid if your weight increases, your body starts to swell, or you have trouble breathing. This information is not intended to replace advice given to you by your health care provider. Make sure you discuss any questions you have with your health care provider. Document Released: 08/09/2007 Document Revised: 02/02/2019 Document Reviewed: 06/16/2017 Elsevier Patient Education  [place].  Low-Sodium Eating Plan Sodium, which is an element that makes up salt, helps you maintain a healthy balance of fluids in your body. Too much sodium can increase your blood pressure and cause fluid and waste to be held in your body. Your health care provider or dietitian may recommend following this plan if you have high blood pressure (hypertension), kidney disease, liver disease, or heart failure. Eating less sodium can help lower your blood pressure, reduce swelling, and protect your heart, liver, and kidneys. What are tips for following this plan? General guidelines  Most people on this plan should limit their sodium intake to 1,500-2,000 mg (milligrams) of sodium each day. Reading food labels   The Nutrition  Facts label lists the amount of sodium in one serving of the food. If you eat more than one serving, you must multiply the listed amount of sodium by the number of servings.  Choose foods with less than 140 mg of sodium per serving.  Avoid foods with 300 mg of sodium or more per serving. Shopping  Look for lower-sodium products, often labeled as "low-sodium" or "no salt added."  Always check the sodium content even if foods are labeled as "unsalted" or "no salt added".  Buy fresh foods. ? Avoid canned foods and premade or frozen meals. ? Avoid canned, cured, or processed meats  Buy breads that have less than 80 mg of sodium per slice. Cooking  Eat more home-cooked food and less restaurant, buffet, and fast food.  Avoid adding salt when cooking. Use salt-free seasonings or herbs instead of table salt or sea salt. Check with your health care provider or pharmacist before using salt substitutes.  Cook with plant-based oils, such as canola, sunflower, or olive oil. Meal planning  When eating at a restaurant, ask that your food be prepared with less salt or no salt, if possible.  Avoid foods that contain MSG (monosodium glutamate). MSG is sometimes added to [place] food, bouillon, and some canned foods. What foods are recommended? The items listed may not be a complete list. Talk with your dietitian about what dietary choices are best for you. Grains Low-sodium cereals, including oats, puffed wheat and rice, and shredded wheat. Low-sodium crackers. Unsalted rice. Unsalted pasta. Low-sodium bread. Whole-grain breads and whole-grain pasta. Vegetables Fresh or frozen vegetables. "No salt added" canned vegetables. "No salt added" tomato sauce and paste. Low-sodium or reduced-sodium tomato and vegetable juice. Fruits Fresh, frozen, or canned fruit. Fruit juice. Meats and other protein foods Fresh or frozen (no salt added) meat, poultry, seafood, and fish. Low-sodium canned tuna and salmon.  Unsalted nuts. Dried peas, beans, and lentils without added salt. Unsalted canned beans. Eggs. Unsalted nut butters. Dairy Milk. Soy milk. Cheese that is naturally low in sodium, such as ricotta cheese, fresh mozzarella, or Swiss cheese Low-sodium or reduced-sodium cheese. Cream cheese. Yogurt. Fats and oils Unsalted butter. Unsalted margarine with no trans fat. Vegetable oils such as canola or olive oils. Seasonings and other foods Fresh and dried herbs and spices. Salt-free seasonings. Low-sodium mustard and ketchup. Sodium-free salad dressing. Sodium-free light mayonnaise. Fresh  or refrigerated horseradish. Lemon juice. Vinegar. Homemade, reduced-sodium, or low-sodium soups. Unsalted popcorn and pretzels. Low-salt or salt-free chips. What foods are not recommended? The items listed may not be a complete list. Talk with your dietitian about what dietary choices are best for you. Grains Instant hot cereals. Bread stuffing, pancake, and biscuit mixes. Croutons. Seasoned rice or pasta mixes. Noodle soup cups. Boxed or frozen macaroni and cheese. Regular salted crackers. Self-rising flour. Vegetables Sauerkraut, pickled vegetables, and relishes. Olives. [place] fries. Onion rings. Regular canned vegetables (not low-sodium or reduced-sodium). Regular canned tomato sauce and paste (not low-sodium or reduced-sodium). Regular tomato and vegetable juice (not low-sodium or reduced-sodium). Frozen vegetables in sauces. Meats and other protein foods Meat or fish that is salted, canned, smoked, spiced, or pickled. Bacon, ham, sausage, hotdogs, corned beef, chipped beef, packaged lunch meats, salt pork, jerky, pickled herring, anchovies, regular canned tuna, sardines, salted nuts. Dairy Processed cheese and cheese spreads. Cheese curds. Blue cheese. Feta cheese. String cheese. Regular cottage cheese. Buttermilk. Canned milk. Fats and oils Salted butter. Regular margarine. Ghee. Bacon fat. Seasonings and other  foods Onion salt, garlic salt, seasoned salt, table salt, and sea salt. Canned and packaged gravies. Worcestershire sauce. Tartar sauce. Barbecue sauce. Teriyaki sauce. Soy sauce, including reduced-sodium. Steak sauce. Fish sauce. Oyster sauce. Cocktail sauce. Horseradish that you find on the shelf. Regular ketchup and mustard. Meat flavorings and tenderizers. Bouillon cubes. Hot sauce and Tabasco sauce. Premade or packaged marinades. Premade or packaged taco seasonings. Relishes. Regular salad dressings. Salsa. Potato and tortilla chips. Corn chips and puffs. Salted popcorn and pretzels. Canned or dried soups. Pizza. Frozen entrees and pot pies. Summary  Eating less sodium can help lower your blood pressure, reduce swelling, and protect your heart, liver, and kidneys.  Most people on this plan should limit their sodium intake to 1,500-2,000 mg (milligrams) of sodium each day.  Canned, boxed, and frozen foods are high in sodium. Restaurant foods, fast foods, and pizza are also very high in sodium. You also get sodium by adding salt to food.  Try to cook at home, eat more fresh fruits and vegetables, and eat less fast food, canned, processed, or prepared foods. This information is not intended to replace advice given to you by your health care provider. Make sure you discuss any questions you have with your health care provider. Document Released: 04/03/2002 Document Revised: 09/24/2017 Document Reviewed: 10/05/2016 Elsevier Patient Education  2020 [employer].

## 2019-10-07 LAB — BASIC METABOLIC PANEL
BUN/Creatinine Ratio: 18 (ref 12–28)
BUN: 20 mg/dL (ref 8–27)
CO2: 23 mmol/L (ref 20–29)
Calcium: 11 mg/dL — ABNORMAL HIGH (ref 8.7–10.3)
Chloride: 104 mmol/L (ref 96–106)
Creatinine, Ser: 1.12 mg/dL — ABNORMAL HIGH (ref 0.57–1.00)
GFR calc Af Amer: 52 mL/min/{1.73_m2} — ABNORMAL LOW (ref >59)
GFR calc non Af Amer: 45 mL/min/{1.73_m2} — ABNORMAL LOW (ref >59)
Glucose: 88 mg/dL (ref 65–99)
Potassium: 4.4 mmol/L (ref 3.5–5.2)
Sodium: 141 mmol/L (ref 134–144)

## 2019-10-07 LAB — CBC
Hematocrit: 33.2 % — ABNORMAL LOW (ref 34.0–46.6)
Hemoglobin: 10.6 g/dL — ABNORMAL LOW (ref 11.1–15.9)
MCH: 29.6 pg (ref 26.6–33.0)
MCHC: 31.9 g/dL (ref 31.5–35.7)
MCV: 93 fL (ref 79–97)
Platelets: 154 10*3/uL (ref 150–450)
RBC: 3.58 x10E6/uL — ABNORMAL LOW (ref 3.77–5.28)
RDW: 19.4 % — ABNORMAL HIGH (ref 11.7–15.4)
WBC: 5.6 10*3/uL (ref 3.4–10.8)

## 2019-10-07 LAB — PRO B NATRIURETIC PEPTIDE: NT-Pro BNP: 871 pg/mL — ABNORMAL HIGH (ref 0–738)

## 2019-10-10 ENCOUNTER — Other Ambulatory Visit: Payer: Medicare Other

## 2019-10-10 ENCOUNTER — Other Ambulatory Visit: Payer: Self-pay

## 2019-10-10 ENCOUNTER — Ambulatory Visit (INDEPENDENT_AMBULATORY_CARE_PROVIDER_SITE_OTHER): Payer: Medicare Other | Admitting: *Deleted

## 2019-10-10 DIAGNOSIS — I5042 Chronic combined systolic (congestive) and diastolic (congestive) heart failure: Secondary | ICD-10-CM

## 2019-10-10 DIAGNOSIS — I442 Atrioventricular block, complete: Secondary | ICD-10-CM

## 2019-10-10 DIAGNOSIS — Z950 Presence of cardiac pacemaker: Secondary | ICD-10-CM

## 2019-10-10 LAB — CUP PACEART INCLINIC DEVICE CHECK
Brady Statistic RV Percent Paced: 97 %
Date Time Interrogation Session: 20201215180550
Implantable Lead Implant Date: 20140508
Implantable Lead Implant Date: 20140508
Implantable Lead Implant Date: 20201202
Implantable Lead Location: 753859
Implantable Lead Location: 753860
Implantable Lead Location: 753860
Implantable Lead Model: 4135
Implantable Lead Model: 4136
Implantable Lead Model: 4674
Implantable Lead Serial Number: 29333020
Implantable Lead Serial Number: 29379474
Implantable Lead Serial Number: 851686
Implantable Pulse Generator Implant Date: 20201202
Lead Channel Impedance Value: 627 Ohm
Lead Channel Impedance Value: 705 Ohm
Lead Channel Pacing Threshold Amplitude: 0.8 V
Lead Channel Pacing Threshold Amplitude: 1.8 V
Lead Channel Pacing Threshold Pulse Width: 0.4 ms
Lead Channel Pacing Threshold Pulse Width: 0.4 ms
Lead Channel Sensing Intrinsic Amplitude: 12.4 mV
Lead Channel Sensing Intrinsic Amplitude: 8.9 mV
Pulse Gen Serial Number: 750525

## 2019-10-10 NOTE — Patient Instructions (Signed)
Remove bandaid and wash incision with soap and water while bathing beginning on 10/11/19. Call the [facility] at [phone] if you notice any signs/symptoms of infection (drainage, redness, swelling, fever, or chills), or if you notice a pimple-like spot at your incision.

## 2019-10-10 NOTE — Progress Notes (Signed)
Wound check appointment. Steri-strips removed. Wound without redness or edema. Stitch removed from left lateral incision corner. Antibiotic ointment and bandaid applied. Pt instructed to remove bandaid tomorrow, wash site daily with soap and water, monitor for symptoms of infection or stitch. Incision edges otherwise fully approximated, wound well healed. Normal device function. Thresholds, sensing, and impedances consistent with implant measurements. RV output at chronic settings (auto capture on), LV output fixed at 3.5V for extra safety margin until 3 month visit. Histogram distribution appropriate for patient and level of activity. No high ventricular rates noted. Patient educated about wound care, arm mobility, lifting restrictions, and Latitude monitor. Latitude on 12/29/19 and ROV with Dr. [name] on 01/10/20.

## 2019-10-11 ENCOUNTER — Ambulatory Visit: Payer: Medicare Other

## 2019-10-11 DIAGNOSIS — M47815 Spondylosis without myelopathy or radiculopathy, thoracolumbar region: Secondary | ICD-10-CM | POA: Diagnosis not present

## 2019-10-11 DIAGNOSIS — I5043 Acute on chronic combined systolic (congestive) and diastolic (congestive) heart failure: Secondary | ICD-10-CM | POA: Diagnosis not present

## 2019-10-11 DIAGNOSIS — N183 Chronic kidney disease, stage 3 unspecified: Secondary | ICD-10-CM | POA: Diagnosis not present

## 2019-10-11 DIAGNOSIS — I4819 Other persistent atrial fibrillation: Secondary | ICD-10-CM | POA: Diagnosis not present

## 2019-10-11 DIAGNOSIS — I420 Dilated cardiomyopathy: Secondary | ICD-10-CM | POA: Diagnosis not present

## 2019-10-11 DIAGNOSIS — I130 Hypertensive heart and chronic kidney disease with heart failure and stage 1 through stage 4 chronic kidney disease, or unspecified chronic kidney disease: Secondary | ICD-10-CM | POA: Diagnosis not present

## 2019-10-16 ENCOUNTER — Other Ambulatory Visit: Payer: Self-pay

## 2019-10-16 ENCOUNTER — Ambulatory Visit (INDEPENDENT_AMBULATORY_CARE_PROVIDER_SITE_OTHER): Payer: Medicare Other | Admitting: Pharmacist

## 2019-10-16 ENCOUNTER — Other Ambulatory Visit: Payer: Medicare Other | Admitting: *Deleted

## 2019-10-16 DIAGNOSIS — I10 Essential (primary) hypertension: Secondary | ICD-10-CM

## 2019-10-16 DIAGNOSIS — Z7901 Long term (current) use of anticoagulants: Secondary | ICD-10-CM | POA: Diagnosis not present

## 2019-10-16 DIAGNOSIS — I5043 Acute on chronic combined systolic (congestive) and diastolic (congestive) heart failure: Secondary | ICD-10-CM

## 2019-10-16 LAB — POCT INR: INR: 1.7 — AB (ref 2.0–3.0)

## 2019-10-16 NOTE — Patient Instructions (Signed)
Take 1 tablet today, then start taking 1/2 tablet daily except a whole tablet on Wedensday.  Recheck in 2 weeks (at appointment with Mickel Baas).

## 2019-10-17 DIAGNOSIS — I130 Hypertensive heart and chronic kidney disease with heart failure and stage 1 through stage 4 chronic kidney disease, or unspecified chronic kidney disease: Secondary | ICD-10-CM | POA: Diagnosis not present

## 2019-10-17 DIAGNOSIS — I420 Dilated cardiomyopathy: Secondary | ICD-10-CM | POA: Diagnosis not present

## 2019-10-17 DIAGNOSIS — N183 Chronic kidney disease, stage 3 unspecified: Secondary | ICD-10-CM | POA: Diagnosis not present

## 2019-10-17 DIAGNOSIS — M47815 Spondylosis without myelopathy or radiculopathy, thoracolumbar region: Secondary | ICD-10-CM | POA: Diagnosis not present

## 2019-10-17 DIAGNOSIS — I4819 Other persistent atrial fibrillation: Secondary | ICD-10-CM | POA: Diagnosis not present

## 2019-10-17 DIAGNOSIS — I5043 Acute on chronic combined systolic (congestive) and diastolic (congestive) heart failure: Secondary | ICD-10-CM | POA: Diagnosis not present

## 2019-10-17 LAB — BASIC METABOLIC PANEL
BUN/Creatinine Ratio: 19 (ref 12–28)
BUN: 22 mg/dL (ref 8–27)
CO2: 24 mmol/L (ref 20–29)
Calcium: 10.8 mg/dL — ABNORMAL HIGH (ref 8.7–10.3)
Chloride: 104 mmol/L (ref 96–106)
Creatinine, Ser: 1.16 mg/dL — ABNORMAL HIGH (ref 0.57–1.00)
GFR calc Af Amer: 50 mL/min/{1.73_m2} — ABNORMAL LOW (ref >59)
GFR calc non Af Amer: 43 mL/min/{1.73_m2} — ABNORMAL LOW (ref >59)
Glucose: 90 mg/dL (ref 65–99)
Potassium: 4.8 mmol/L (ref 3.5–5.2)
Sodium: 142 mmol/L (ref 134–144)

## 2019-10-17 LAB — PRO B NATRIURETIC PEPTIDE: NT-Pro BNP: 1191 pg/mL — ABNORMAL HIGH (ref 0–738)

## 2019-10-18 ENCOUNTER — Ambulatory Visit: Payer: Medicare Other

## 2019-10-18 NOTE — Progress Notes (Signed)
PPM remote 

## 2019-10-23 ENCOUNTER — Other Ambulatory Visit: Payer: Self-pay

## 2019-10-23 ENCOUNTER — Ambulatory Visit (HOSPITAL_COMMUNITY)
Admission: RE | Admit: 2019-10-23 | Discharge: 2019-10-23 | Disposition: A | Discharge status: Home / Self Care | Payer: Medicare Other | Source: Ambulatory Visit | Attending: Internal Medicine | Admitting: Internal Medicine

## 2019-10-23 DIAGNOSIS — D509 Iron deficiency anemia, unspecified: Secondary | ICD-10-CM | POA: Insufficient documentation

## 2019-10-23 MED ORDER — SODIUM CHLORIDE 0.9 % IV SOLN
510.0000 mg | Freq: Once | INTRAVENOUS | Status: AC
  Administered 2019-10-23: 14:00:00 510 mg via INTRAVENOUS
  Filled 2019-10-23: qty 17

## 2019-10-23 MED ORDER — SODIUM CHLORIDE 0.9 % IV SOLN
INTRAVENOUS | Status: DC | PRN
  Administered 2019-10-23: 250 mL via INTRAVENOUS

## 2019-10-23 NOTE — Discharge Instructions (Signed)

## 2019-10-23 NOTE — Progress Notes (Signed)
PATIENT CARE CENTER NOTE  Diagnosis: Iron Deficiency Anemia    Provider: Harlan Stains, MD   Procedure: Feraheme IV   Note: Patient received Feraheme infusion via PIV. Tolerated well with no adverse reaction. Observed patient for 30 minutes post-infusion. Vital signs stable. Discharge instructions given.  Alert, oriented and ambulatory with walker at discharge.

## 2019-10-25 DIAGNOSIS — I4819 Other persistent atrial fibrillation: Secondary | ICD-10-CM | POA: Diagnosis not present

## 2019-10-25 DIAGNOSIS — I420 Dilated cardiomyopathy: Secondary | ICD-10-CM | POA: Diagnosis not present

## 2019-10-25 DIAGNOSIS — I130 Hypertensive heart and chronic kidney disease with heart failure and stage 1 through stage 4 chronic kidney disease, or unspecified chronic kidney disease: Secondary | ICD-10-CM | POA: Diagnosis not present

## 2019-10-25 DIAGNOSIS — N183 Chronic kidney disease, stage 3 unspecified: Secondary | ICD-10-CM | POA: Diagnosis not present

## 2019-10-25 DIAGNOSIS — I5043 Acute on chronic combined systolic (congestive) and diastolic (congestive) heart failure: Secondary | ICD-10-CM | POA: Diagnosis not present

## 2019-10-25 DIAGNOSIS — M47815 Spondylosis without myelopathy or radiculopathy, thoracolumbar region: Secondary | ICD-10-CM | POA: Diagnosis not present

## 2019-10-30 DIAGNOSIS — L298 Other pruritus: Secondary | ICD-10-CM | POA: Diagnosis not present

## 2019-10-30 DIAGNOSIS — L218 Other seborrheic dermatitis: Secondary | ICD-10-CM | POA: Diagnosis not present

## 2019-11-01 ENCOUNTER — Ambulatory Visit
Admission: RE | Admit: 2019-11-01 | Discharge: 2019-11-01 | Disposition: A | Discharge status: Home / Self Care | Payer: Medicare Other | Source: Ambulatory Visit | Attending: Critical Care Medicine | Admitting: Critical Care Medicine

## 2019-11-01 ENCOUNTER — Other Ambulatory Visit: Payer: Self-pay

## 2019-11-01 DIAGNOSIS — R059 Cough, unspecified: Secondary | ICD-10-CM

## 2019-11-01 DIAGNOSIS — I700 Atherosclerosis of aorta: Secondary | ICD-10-CM | POA: Diagnosis not present

## 2019-11-01 DIAGNOSIS — R0609 Other forms of dyspnea: Secondary | ICD-10-CM

## 2019-11-01 DIAGNOSIS — J984 Other disorders of lung: Secondary | ICD-10-CM | POA: Diagnosis not present

## 2019-11-01 DIAGNOSIS — R05 Cough: Secondary | ICD-10-CM

## 2019-11-01 DIAGNOSIS — R9389 Abnormal findings on diagnostic imaging of other specified body structures: Secondary | ICD-10-CM

## 2019-11-01 DIAGNOSIS — M47814 Spondylosis without myelopathy or radiculopathy, thoracic region: Secondary | ICD-10-CM | POA: Diagnosis not present

## 2019-11-01 DIAGNOSIS — I2510 Atherosclerotic heart disease of native coronary artery without angina pectoris: Secondary | ICD-10-CM | POA: Diagnosis not present

## 2019-11-02 ENCOUNTER — Encounter: Payer: Self-pay | Admitting: Cardiology

## 2019-11-02 ENCOUNTER — Ambulatory Visit (INDEPENDENT_AMBULATORY_CARE_PROVIDER_SITE_OTHER): Payer: Medicare Other | Admitting: *Deleted

## 2019-11-02 ENCOUNTER — Ambulatory Visit (INDEPENDENT_AMBULATORY_CARE_PROVIDER_SITE_OTHER): Payer: Medicare Other | Admitting: Cardiology

## 2019-11-02 VITALS — BP 118/74 | HR 70 | Ht 65.0 in | Wt 175.0 lb

## 2019-11-02 DIAGNOSIS — I080 Rheumatic disorders of both mitral and aortic valves: Secondary | ICD-10-CM | POA: Diagnosis not present

## 2019-11-02 DIAGNOSIS — Z950 Presence of cardiac pacemaker: Secondary | ICD-10-CM | POA: Diagnosis not present

## 2019-11-02 DIAGNOSIS — N183 Chronic kidney disease, stage 3 unspecified: Secondary | ICD-10-CM

## 2019-11-02 DIAGNOSIS — I130 Hypertensive heart and chronic kidney disease with heart failure and stage 1 through stage 4 chronic kidney disease, or unspecified chronic kidney disease: Secondary | ICD-10-CM | POA: Diagnosis not present

## 2019-11-02 DIAGNOSIS — Z7901 Long term (current) use of anticoagulants: Secondary | ICD-10-CM

## 2019-11-02 DIAGNOSIS — Z5181 Encounter for therapeutic drug level monitoring: Secondary | ICD-10-CM

## 2019-11-02 DIAGNOSIS — D508 Other iron deficiency anemias: Secondary | ICD-10-CM

## 2019-11-02 DIAGNOSIS — G8929 Other chronic pain: Secondary | ICD-10-CM | POA: Diagnosis not present

## 2019-11-02 DIAGNOSIS — I4819 Other persistent atrial fibrillation: Secondary | ICD-10-CM | POA: Diagnosis not present

## 2019-11-02 DIAGNOSIS — I5043 Acute on chronic combined systolic (congestive) and diastolic (congestive) heart failure: Secondary | ICD-10-CM

## 2019-11-02 DIAGNOSIS — R5383 Other fatigue: Secondary | ICD-10-CM

## 2019-11-02 DIAGNOSIS — D500 Iron deficiency anemia secondary to blood loss (chronic): Secondary | ICD-10-CM

## 2019-11-02 DIAGNOSIS — E785 Hyperlipidemia, unspecified: Secondary | ICD-10-CM | POA: Diagnosis not present

## 2019-11-02 DIAGNOSIS — Z89421 Acquired absence of other right toe(s): Secondary | ICD-10-CM | POA: Diagnosis not present

## 2019-11-02 DIAGNOSIS — Z9181 History of falling: Secondary | ICD-10-CM | POA: Diagnosis not present

## 2019-11-02 DIAGNOSIS — I420 Dilated cardiomyopathy: Secondary | ICD-10-CM | POA: Diagnosis not present

## 2019-11-02 DIAGNOSIS — M545 Low back pain: Secondary | ICD-10-CM | POA: Diagnosis not present

## 2019-11-02 DIAGNOSIS — I495 Sick sinus syndrome: Secondary | ICD-10-CM | POA: Diagnosis not present

## 2019-11-02 DIAGNOSIS — M47815 Spondylosis without myelopathy or radiculopathy, thoracolumbar region: Secondary | ICD-10-CM | POA: Diagnosis not present

## 2019-11-02 DIAGNOSIS — Z85828 Personal history of other malignant neoplasm of skin: Secondary | ICD-10-CM | POA: Diagnosis not present

## 2019-11-02 DIAGNOSIS — K219 Gastro-esophageal reflux disease without esophagitis: Secondary | ICD-10-CM | POA: Diagnosis not present

## 2019-11-02 DIAGNOSIS — F419 Anxiety disorder, unspecified: Secondary | ICD-10-CM | POA: Diagnosis not present

## 2019-11-02 LAB — POCT INR: INR: 1.8 — AB (ref 2.0–3.0)

## 2019-11-02 MED ORDER — POTASSIUM CHLORIDE ER 10 MEQ PO TBCR: 10.0000 meq | EXTENDED_RELEASE_TABLET | ORAL | 3 refills | Status: DC

## 2019-11-02 MED ORDER — FUROSEMIDE 40 MG PO TABS: 40.0000 mg | ORAL_TABLET | Freq: Every day | ORAL | 3 refills | Status: DC

## 2019-11-02 NOTE — Progress Notes (Signed)
Cardiology Office Note   Date:  11/02/2019   ID:  Molly [name], DOB 1933/03/17, MRN [id number]  PCP:  Harlan Stains, MD  Cardiologist:  Dr Radford Pax EP Dr [name]    Chief Complaint  Patient presents with  . Congestive Heart Failure      History of Present Illness: Molly [name] is a 86 y.o. female who presents for CHF follow up   She has a hx of chronic systolic and diastolic CHF (presume NICM), tachy-brady syndrome s/p PPM implant 2014, persistent atrial fibrillations/p recent AVN ablation,CKD stage III, hyperparathyroidism,  pacing induced LBBB s/p  [employer]CRT-Pupgrade on 09/27/2019, chronic afib presents for LE edema.   She has long history of atrial fib going back many years and required PPM as above. In 2017 her echo showed reduction in EF to 40-45% with mild AI and mild-moderate MR, so nuclear stress test was pursued which showed no ischemia. Most recent echocardiogram in 05/26/19 showed further decline in EF to 30-35%, normal RVSP, moderate LAE, mild RAE, moderate mitral regurgitation, mild aortic regurgitation.Her afib has been difficult to control and she has been followed by EP and the afib clinic. She failedamiodarone, Tikosyn and sotalol in the past.She wasnot felt to be a good candidate for afib ablation given her severely dilated LA. She was then admitted 05/25/19 with worsening fatigue, dyspnea, and weight gain with mildly elevated BNP and uncontrolled atrial arrhythmias. She was treated with IV Lasix and underwentAV nodeablation 8/3/20and therefore HR is now dictated by her pacemaker. Her Lasix was stopped due to AKI. She has history of hyperkalemia with ACEI, ARB, and spironolactone and blood pressure has otherwise been prohibitive of aggressive med titration. She continued to volume overload despite above tx. Eventually underwent  [employer]CRT-Pupgrade on 09/27/2019. She was also evaluated by Pulmonary for cough.   Called 12/6 with  increased LE edema. Increase lasix. Seen in office 10/06/19  for further evaluation. No improvement. Has edema on LLE. Feels abdominal tightness and has orthopnea. Breathing stable. Compliant with medication and low sodium. Reports about 8lb weight gain.   Her lasix increased to 40 BID, and K+ 10 meq daily Cr up very slightly. Today back for follow up.  Her wt is down 20 lbs and this is on our scales and her scales.  She is very weak and tired. She has been to a lot of appts as well.  No chest pain and no SOB, her cough has resolved.  No edema.       Past Medical History:  Diagnosis Date  . Aortic insufficiency   . Atrial tachycardia ([place])   . Cancer ([place])    Skin cancer- basal.  1 mylenoma  . Chronic combined systolic and diastolic CHF (congestive heart failure) ([place])   . CKD (chronic kidney disease), stage III   . Complication of anesthesia   . DCM (dilated cardiomyopathy) ([place])    EF 40-45% by echo 2017 - nuclear stress test with no ischemia  . GERD (gastroesophageal reflux disease)   . H/O benign essential tremor    on amiodarone resolved off amio  . H/O cardiac radiofrequency ablation    a. AV node ablation in 05/2019.  Marland Kitchen H/O hyperkalemia    Resolved off ACE inhibitors  . High cholesterol   . History of blood transfusion   . Hyperkalemia   . Hyperparathyroidism ([place])   . Hypertension   . Mitral regurgitation   . Persistent atrial fibrillation ([place])   . PONV (postoperative nausea  and vomiting)   . PVC's (premature ventricular contractions)   . Tachycardia-bradycardia syndrome [facility])    s/p PPM 02/2013    Past Surgical History:  Procedure Laterality Date  . AMPUTATION Right 11/27/2014   Procedure: RIGHT 2ND AND 3RD TOE AMPUTATION;  Surgeon: Wylene Simmer, MD;  Location: [place];  Service: Orthopedics;  Laterality: Right;  . ATRIAL FIBRILLATION ABLATION N/A 05/29/2019   Procedure: AVN ABLATION;  Surgeon: Evans Lance, MD;  Location: [place] [facility];  Service: Cardiovascular;   Laterality: N/A;  . BIV UPGRADE N/A 09/27/2019   Procedure: BIV UPGRADE;  Surgeon: Evans Lance, MD;  Location: [place] [facility];  Service: Cardiovascular;  Laterality: N/A;  . BUNIONECTOMY     Right  . CARDIAC CATHETERIZATION  09/25/2000   EF of 50% -- with normal left ventricular size and function  . CARDIOVERSION N/A 12/28/2017   Procedure: CARDIOVERSION;  Surgeon: Dorothy Spark, MD;  Location: [facility] ENDOSCOPY;  Service: Cardiovascular;  Laterality: N/A;  . CARDIOVERSION N/A 02/25/2018   Procedure: CARDIOVERSION;  Surgeon: Skeet Latch, MD;  Location: [place];  Service: Cardiovascular;  Laterality: N/A;  . CESAREAN SECTION    . CESAREAN SECTION    . EYE SURGERY Bilateral    Cataract  . FOOT SURGERY    . INSERT / REPLACE / REMOVE PACEMAKER  02/2013  . PACEMAKER INSERTION  02/2013  . PERMANENT PACEMAKER INSERTION N/A 03/02/2013   Procedure: PERMANENT PACEMAKER INSERTION;  Surgeon: Evans Lance, MD;  Location: [facility] CATH LAB;  Service: Cardiovascular;  Laterality: N/A;  . SHOULDER ARTHROSCOPY W/ ROTATOR CUFF REPAIR    . SHOULDER SURGERY Right    roto cuff  . TOE AMPUTATION Left    2nd and 3rd, bunion under toes.  . TONSILLECTOMY    . VARICOSE VEIN SURGERY       Current Outpatient Medications  Medication Sig Dispense Refill  . acetaminophen (TYLENOL) 500 MG tablet Take 1,000 mg by mouth 2 (two) times daily. For pain     . dorzolamide (TRUSOPT) 2 % ophthalmic solution Place 1 drop into the right eye 2 (two) times daily.    . ferrous sulfate 325 (65 FE) MG tablet Take 325 mg by mouth every other day. In the morning    . fluticasone (FLONASE) 50 MCG/ACT nasal spray Place 2 sprays into both nostrils daily. 16 g 2  . furosemide (LASIX) 40 MG tablet Take 1 tablet (40 mg total) by mouth daily. 90 tablet 3  . levothyroxine (SYNTHROID) 50 MCG tablet Take 0.5 tablets (25 mcg total) by mouth daily. 90 tablet 0  . LORazepam (ATIVAN) 0.5 MG tablet TAKE 1 TABLET (0.5 MG TOTAL) BY MOUTH  AT BEDTIME. 30 tablet 2  . Multiple Vitamin (MULTIVITAMIN WITH MINERALS) TABS Take 1 tablet by mouth daily.    Marland Kitchen omeprazole (PRILOSEC) 20 MG capsule Take 20 mg by mouth every evening.     . potassium chloride (KLOR-CON) 10 MEQ tablet Take 1 tablet (10 mEq total) by mouth every other day. 45 tablet 3  . warfarin (COUMADIN) 5 MG tablet Take 1/2 tablet daily or Take as directed by anticoagulation clinic 60 tablet 1   No current facility-administered medications for this visit.    Allergies:   Ace inhibitors, Losartan, Codeine, and Tramadol    Social History:  The patient  reports that she has never smoked. She has never used smokeless tobacco. She reports that she does not drink alcohol or use drugs.   Family  History:  The patient's family history includes Breast cancer in her sister; Hypertension in her father and mother.    ROS:  General:no colds or fevers, no weight changes Skin:no rashes or ulcers HEENT:no blurred vision, no congestion CV:see HPI PUL:see HPI GI:no diarrhea constipation or melena, no indigestion GU:no hematuria, no dysuria MS:no joint pain, no claudication Neuro:no syncope, no lightheadedness Endo:no diabetes, no thyroid disease, + hyperparathyroidism   Wt Readings from Last 3 Encounters:  11/02/19 175 lb (79.4 kg)  10/06/19 193 lb 12.8 oz (87.9 kg)  09/27/19 187 lb (84.8 kg)     PHYSICAL EXAM: VS:  BP 118/74   Pulse 70   Ht 5\' 5"  (1.651 m)   Wt 175 lb (79.4 kg)   SpO2 99%   BMI 29.12 kg/m  , BMI Body mass index is 29.12 kg/m. General:Pleasant affect, NAD Skin:Warm and dry, brisk capillary refill HEENT:normocephalic, sclera clear, mucus membranes moist Neck:supple, no JVD, no bruits  Heart:S1S2 RRR without murmur, gallup, rub or click Lungs:clear without rales, rhonchi, or wheezes [id number], non tender, + BS, do not palpate liver spleen or masses Ext:no lower ext edema, 2+ pedal pulses, 2+ radial pulses Neuro:alert and oriented X 3, MAE, follows  commands, + facial symmetry    EKG:  EKG is not ordered today.   Recent Labs: 05/28/2019: Magnesium 1.8 09/06/2019: ALT 16; BNP 381.0; TSH 6.510 10/06/2019: Hemoglobin 10.6; Platelets 154 10/16/2019: BUN 22; Creatinine, Ser 1.16; NT-Pro BNP 1,191; Potassium 4.8; Sodium 142    Lipid Panel    Component Value Date/Time   CHOL 156 05/23/2012 0514   TRIG 86 05/23/2012 0514   HDL 80 05/23/2012 0514   CHOLHDL 2.0 05/23/2012 0514   VLDL 17 05/23/2012 0514   LDLCALC 59 05/23/2012 0514       Other studies Reviewed: Additional studies/ records that were reviewed today include:  Echo 05/26/19 . IMPRESSIONS    1. The left ventricle has moderate-severely reduced systolic function, with an ejection fraction of 30-35%. The cavity size was normal. Left ventricular diastolic function could not be evaluated secondary to atrial fibrillation. Left ventricular diffuse  hypokinesis.  2. The right ventricle has normal systolic function. The cavity was normal. There is no increase in right ventricular wall thickness. Right ventricular systolic pressure is normal.  3. Left atrial size was moderately dilated.  4. Right atrial size was mildly dilated.  5. There is mild mitral annular calcification present. Mitral valve regurgitation is moderate by color flow Doppler.  6. The aortic valve is tricuspid. Mild thickening of the aortic valve. Mild calcification of the aortic valve. Aortic valve regurgitation is mild by color flow Doppler. No stenosis of the aortic valve.  7. The aorta is normal in size and structure.  8. The aortic root, ascending aorta and aortic arch are normal in size and structure.  SUMMARY   LV with moderate to severely reduced LV EF with global hypokinesis, with a drop in EF since prior study.  FINDINGS  Left Ventricle: The left ventricle has moderate-severely reduced systolic function, with an ejection fraction of 30-35%. The cavity size was normal. There is no increase in left  ventricular wall thickness. Left ventricular diastolic function could not  be evaluated secondary to atrial fibrillation. Left ventricular diffuse hypokinesis.  Right Ventricle: The right ventricle has normal systolic function. The cavity was normal. There is no increase in right ventricular wall thickness. Right ventricular systolic pressure is normal. Pacing wire/catheter visualized in the right ventricle.  Left Atrium: Left  atrial size was moderately dilated.  Right Atrium: Right atrial size was mildly dilated.  Interatrial Septum: No atrial level shunt detected by color flow Doppler.  Pericardium: There is no evidence of pericardial effusion. There is a small pleural effusion.  Mitral Valve: The mitral valve is normal in structure. There is mild mitral annular calcification present. Mitral valve regurgitation is moderate by color flow Doppler.  Tricuspid Valve: The tricuspid valve is normal in structure. Tricuspid valve regurgitation is mild by color flow Doppler.  Aortic Valve: The aortic valve is tricuspid Mild thickening of the aortic valve. Mild calcification of the aortic valve. Aortic valve regurgitation is mild by color flow Doppler. There is No stenosis of the aortic valve.  Pulmonic Valve: The pulmonic valve was grossly normal. Pulmonic valve regurgitation is mild by color flow Doppler. No evidence of pulmonic stenosis.  Aorta: The aortic root, ascending aorta and aortic arch are normal in size and structure. The aorta is normal in size and structure.  Pulmonary Artery: The pulmonary artery is not well seen.  Venous: The inferior vena cava was not well visualized.  10/31/18  High resolution CT of chest  FINDINGS: Cardiovascular: Stable mild cardiomegaly. No significant pericardial effusion/thickening. Three lead left subclavian pacemaker is noted with lead tips in the right atrium, right ventricular apex and coronary sinus. Left anterior descending and right  coronary atherosclerosis. Atherosclerotic nonaneurysmal thoracic aorta. Top-normal caliber main pulmonary artery (3.2 cm diameter).  Mediastinum/Nodes: No discrete thyroid nodules. Unremarkable esophagus. No pathologically enlarged axillary, mediastinal or hilar lymph nodes, noting limited sensitivity for the detection of hilar adenopathy on this noncontrast study.  Lungs/Pleura: No pneumothorax. No pleural effusion. No acute consolidative airspace disease or lung masses. Right middle lobe 3 mm solid pulmonary nodule (series 5/image 82) is stable. No new significant pulmonary nodules. Mild patchy subpleural reticulation and ground-glass attenuation in both lungs with a lower lobe predominance. No significant regions of traction bronchiectasis, architectural distortion or frank honeycombing. Findings persist on the prone sequence and appear slightly decreased since 07/06/2019 CT. Mild patchy air trapping in both lungs, without evidence of tracheobronchomalacia.  Upper abdomen: Coarse left liver calcification is unchanged and compatible with prior granulomatous disease.  Musculoskeletal: No aggressive appearing focal osseous lesions. Moderate thoracic spondylosis.  IMPRESSION: 1. Mild patchy subpleural reticulation and ground-glass opacity in both lungs with a lower lung predominance, decreased since 07/06/2019 CT. No significant bronchiectasis or honeycombing. Findings may indicate a mild interstitial lung disease such as nonspecific interstitial pneumonia (NSIP). Usual interstitial pneumonia (UIP) not favored given improvement and given presence of air trapping. Findings are suggestive of an alternative diagnosis (not UIP) per consensus guidelines: Diagnosis of Idiopathic Pulmonary Fibrosis: An Official ATS/ERS/JRS/ALAT Clinical Practice Guideline. [place], Iss 5, [phone], Jun 26 2017. 2. Mild patchy air trapping in both lungs, indicative of  small airways disease. 3. Right middle lobe 3 mm solid pulmonary nodule, stable, probably benign. No follow-up needed if patient is low-risk. Non-contrast chest CT can be considered in 12 months if patient is high-risk. This recommendation follows the consensus statement: Guidelines for Management of Incidental Pulmonary Nodules Detected on CT Images:From the Fleischner Society 2017; published online before print (10.1148/radiol.IJ:2314499). 4. Two vessel coronary atherosclerosis. 5. Mild cardiomegaly.  Aortic Atherosclerosis (ICD10-I70.0).  ASSESSMENT AND PLAN:  1.  Acute on chronic combined CHF, now euvolemic with loss of 20 lbs.  Decrease lasix to 40 mg once per day and check BMP for CKD  Decrease K+ to  10 meq every other day.   2.  Fatigue may be due to 20 lb wt loss.  And freq appts, decreasing lasix.   3.  SSS with PPM and up grade to CRTP recently and AV node ablation.  Followed by Dr. [name]  4.  Persistent atrial fib with AVN ablation and on anticoagulation with coumadin to be checked today.   6.  Anemia will check CBC today and send to Dr. Dema Severin  7.  Was to have stress test but did not have done, will hold for now and see back in a month  8.  CKD 3 check BMP now diuresed.    Current medicines are reviewed with the patient today.  The patient Has no concerns regarding medicines.  The following changes have been made:  See above Labs/ tests ordered today include:see above  Disposition:   FU:  see above  Signed, Cecilie Kicks, NP  11/02/2019 5:10 PM    [address], [address] [place], [place] Phone: [phone]; Fax: [phone]

## 2019-11-02 NOTE — Patient Instructions (Signed)
Description   Take 1 tablet today, then start taking 1/2 tablet daily except a 1 tablet on Monday and Wedensday.  Recheck in 2-3 weeks. Coumadin Clinic [phone]

## 2019-11-02 NOTE — Patient Instructions (Addendum)
Medication Instructions:   Your physician has recommended you make the following change in your medication:   1) Decrease your Lasix to 40MG , 1 tablet by mouth once a day 2) Decrease your Potassium 70mEq, 1 tablet by mouth every other day  *If you need a refill on your cardiac medications before your next appointment, please call your pharmacy*  Lab Work:  You will have labs drawn today: BMET and CBC  If you have labs (blood work) drawn today and your tests are completely normal, you will receive your results only by: Marland Kitchen MyChart Message (if you have MyChart) OR . A paper copy in the mail If you have any lab test that is abnormal or we need to change your treatment, we will call you to review the results.  Testing/Procedures:  None ordered today  Follow-Up: At [facility], you and your health needs are our priority.  As part of our continuing mission to provide you with exceptional heart care, we have created designated Provider Care Teams.  These Care Teams include your primary Cardiologist (physician) and Advanced Practice Providers (APPs -  Physician Assistants and Nurse Practitioners) who all work together to provide you with the care you need, when you need it.  Your next appointment:    On 12/14/19 at 3:00PM with Fransico Him, MD

## 2019-11-03 ENCOUNTER — Other Ambulatory Visit: Payer: Self-pay | Admitting: Critical Care Medicine

## 2019-11-03 ENCOUNTER — Telehealth: Payer: Self-pay | Admitting: *Deleted

## 2019-11-03 DIAGNOSIS — M47815 Spondylosis without myelopathy or radiculopathy, thoracolumbar region: Secondary | ICD-10-CM | POA: Diagnosis not present

## 2019-11-03 DIAGNOSIS — I130 Hypertensive heart and chronic kidney disease with heart failure and stage 1 through stage 4 chronic kidney disease, or unspecified chronic kidney disease: Secondary | ICD-10-CM | POA: Diagnosis not present

## 2019-11-03 DIAGNOSIS — I4819 Other persistent atrial fibrillation: Secondary | ICD-10-CM | POA: Diagnosis not present

## 2019-11-03 DIAGNOSIS — N183 Chronic kidney disease, stage 3 unspecified: Secondary | ICD-10-CM | POA: Diagnosis not present

## 2019-11-03 DIAGNOSIS — J849 Interstitial pulmonary disease, unspecified: Secondary | ICD-10-CM

## 2019-11-03 DIAGNOSIS — Z5181 Encounter for therapeutic drug level monitoring: Secondary | ICD-10-CM

## 2019-11-03 DIAGNOSIS — E875 Hyperkalemia: Secondary | ICD-10-CM

## 2019-11-03 DIAGNOSIS — I420 Dilated cardiomyopathy: Secondary | ICD-10-CM | POA: Diagnosis not present

## 2019-11-03 DIAGNOSIS — I5043 Acute on chronic combined systolic (congestive) and diastolic (congestive) heart failure: Secondary | ICD-10-CM | POA: Diagnosis not present

## 2019-11-03 LAB — CBC
Hematocrit: 39.5 % (ref 34.0–46.6)
Hemoglobin: 13 g/dL (ref 11.1–15.9)
MCH: 31.4 pg (ref 26.6–33.0)
MCHC: 32.9 g/dL (ref 31.5–35.7)
MCV: 95 fL (ref 79–97)
Platelets: 179 10*3/uL (ref 150–450)
RBC: 4.14 x10E6/uL (ref 3.77–5.28)
RDW: 19.6 % — ABNORMAL HIGH (ref 11.7–15.4)
WBC: 7.5 10*3/uL (ref 3.4–10.8)

## 2019-11-03 LAB — BASIC METABOLIC PANEL
BUN/Creatinine Ratio: 26 (ref 12–28)
BUN: 30 mg/dL — ABNORMAL HIGH (ref 8–27)
CO2: 23 mmol/L (ref 20–29)
Calcium: 11.6 mg/dL — ABNORMAL HIGH (ref 8.7–10.3)
Chloride: 105 mmol/L (ref 96–106)
Creatinine, Ser: 1.17 mg/dL — ABNORMAL HIGH (ref 0.57–1.00)
GFR calc Af Amer: 49 mL/min/{1.73_m2} — ABNORMAL LOW (ref >59)
GFR calc non Af Amer: 43 mL/min/{1.73_m2} — ABNORMAL LOW (ref >59)
Glucose: 95 mg/dL (ref 65–99)
Potassium: 5.3 mmol/L — ABNORMAL HIGH (ref 3.5–5.2)
Sodium: 142 mmol/L (ref 134–144)

## 2019-11-03 NOTE — Progress Notes (Signed)
ILD lab panel ordered.  Molly Hy, DO 11/03/19 1:20 PM [facility]

## 2019-11-03 NOTE — Telephone Encounter (Signed)
Pt made aware of lab results and recommendations per Cecilie Kicks NP.  Pt is aware to stop taking her potassium pill, due to K level being elevated with these results.   Informed the pt that per Mickel Baas, her kidney function is stable, but should improve with decreasing lasix to once a day, as discussed at yesterdays visit. Informed the pt that Mickel Baas would like for her to come back into the office to have a repeat BMET done in 2 weeks.  Pt states that she has a coumadin clinic appt on 11/20/19 at 3:15 pm, here at the office.  Pt states she would like her lab to be done same day as her coumadin clinic appt,  to help with commute.  Scheduled the pt to come in for repeat BMET, same day as her coumadin clinic appt, on 11/20/19 at 3 pm.  This is 2 days over the 2 week mark.  Will pass this preference date along to Cecilie Kicks NP, as a general FYI.  Informed the pt that Mickel Baas already routed her results to her PCP.  Pt was gracious for this, being she will also see her PCP next week.  Pt verbalized understanding and agrees with this plan. Pt was more than gracious for all the assistance provided.

## 2019-11-03 NOTE — Progress Notes (Signed)
Molly [name],  Can you please let [name] know that her CT scan is still abnormal but does look better than her last one. We will still need to do labs to try to get a better idea of why this is happening. I also want to try to start her on an inhaler to see if that helps her symptoms any based on the appearance of her CT scan. I would try advair 100-50 BID- you can send it to the pharmacy of her choosing.  Do we know when her PFTs are scheduled?  Can we have her come to the lab one day next week please to get her bloodwork drawn? Thanks! (they're ordered)  Arby Barrette

## 2019-11-03 NOTE — Telephone Encounter (Signed)
-----   Message from Isaiah Serge, NP sent at 11/03/2019  8:24 AM EST ----- Please ask pt to stop potasium,  her kidney function is stable but should improve with decreasing lasix to once a day that we discussed at visit.   Recheck BMP in 2 weeks.

## 2019-11-06 ENCOUNTER — Other Ambulatory Visit (HOSPITAL_COMMUNITY)
Admission: RE | Admit: 2019-11-06 | Discharge: 2019-11-06 | Disposition: A | Discharge status: Home / Self Care | Payer: Medicare Other | Source: Ambulatory Visit | Attending: Critical Care Medicine | Admitting: Critical Care Medicine

## 2019-11-06 DIAGNOSIS — Z01812 Encounter for preprocedural laboratory examination: Secondary | ICD-10-CM | POA: Insufficient documentation

## 2019-11-06 DIAGNOSIS — Z20822 Contact with and (suspected) exposure to covid-19: Secondary | ICD-10-CM | POA: Diagnosis not present

## 2019-11-07 LAB — NOVEL CORONAVIRUS, NAA (HOSP ORDER, SEND-OUT TO REF LAB; TAT 18-24 HRS): SARS-CoV-2, NAA: NOT DETECTED

## 2019-11-08 DIAGNOSIS — I130 Hypertensive heart and chronic kidney disease with heart failure and stage 1 through stage 4 chronic kidney disease, or unspecified chronic kidney disease: Secondary | ICD-10-CM | POA: Diagnosis not present

## 2019-11-08 DIAGNOSIS — M47815 Spondylosis without myelopathy or radiculopathy, thoracolumbar region: Secondary | ICD-10-CM | POA: Diagnosis not present

## 2019-11-08 DIAGNOSIS — I420 Dilated cardiomyopathy: Secondary | ICD-10-CM | POA: Diagnosis not present

## 2019-11-08 DIAGNOSIS — I4819 Other persistent atrial fibrillation: Secondary | ICD-10-CM | POA: Diagnosis not present

## 2019-11-08 DIAGNOSIS — N183 Chronic kidney disease, stage 3 unspecified: Secondary | ICD-10-CM | POA: Diagnosis not present

## 2019-11-08 DIAGNOSIS — I5043 Acute on chronic combined systolic (congestive) and diastolic (congestive) heart failure: Secondary | ICD-10-CM | POA: Diagnosis not present

## 2019-11-09 ENCOUNTER — Ambulatory Visit (INDEPENDENT_AMBULATORY_CARE_PROVIDER_SITE_OTHER): Payer: Medicare Other | Admitting: Critical Care Medicine

## 2019-11-09 ENCOUNTER — Encounter: Payer: Self-pay | Admitting: Critical Care Medicine

## 2019-11-09 ENCOUNTER — Other Ambulatory Visit: Payer: Self-pay

## 2019-11-09 VITALS — BP 140/80 | HR 69 | Ht 64.0 in | Wt 174.0 lb

## 2019-11-09 DIAGNOSIS — J849 Interstitial pulmonary disease, unspecified: Secondary | ICD-10-CM

## 2019-11-09 DIAGNOSIS — R059 Cough, unspecified: Secondary | ICD-10-CM

## 2019-11-09 DIAGNOSIS — R05 Cough: Secondary | ICD-10-CM | POA: Diagnosis not present

## 2019-11-09 DIAGNOSIS — R0609 Other forms of dyspnea: Secondary | ICD-10-CM

## 2019-11-09 DIAGNOSIS — R0600 Dyspnea, unspecified: Secondary | ICD-10-CM | POA: Diagnosis not present

## 2019-11-09 LAB — PULMONARY FUNCTION TEST
DL/VA % pred: 77 %
DL/VA: 3.13 ml/min/mmHg/L
DLCO unc % pred: 62 %
DLCO unc: 11.56 ml/min/mmHg
FEF 25-75 Post: 2.89 L/sec
FEF 25-75 Pre: 2.54 L/sec
FEF2575-%Change-Post: 13 %
FEF2575-%Pred-Post: 240 %
FEF2575-%Pred-Pre: 211 %
FEV1-%Change-Post: 1 %
FEV1-%Pred-Post: 105 %
FEV1-%Pred-Pre: 104 %
FEV1-Post: 1.9 L
FEV1-Pre: 1.88 L
FEV1FVC-%Change-Post: 2 %
FEV1FVC-%Pred-Pre: 117 %
FEV6-%Change-Post: -1 %
FEV6-%Pred-Post: 93 %
FEV6-%Pred-Pre: 95 %
FEV6-Post: 2.15 L
FEV6-Pre: 2.19 L
FEV6FVC-%Pred-Post: 106 %
FEV6FVC-%Pred-Pre: 106 %
FVC-%Change-Post: -1 %
FVC-%Pred-Post: 88 %
FVC-%Pred-Pre: 89 %
FVC-Post: 2.15 L
FVC-Pre: 2.19 L
Post FEV1/FVC ratio: 88 %
Post FEV6/FVC ratio: 100 %
Pre FEV1/FVC ratio: 86 %
Pre FEV6/FVC Ratio: 100 %
RV % pred: 77 %
RV: 1.91 L
TLC % pred: 79 %
TLC: 4.02 L

## 2019-11-09 LAB — CK: Total CK: 40 U/L (ref 7–177)

## 2019-11-09 LAB — SEDIMENTATION RATE: Sed Rate: 58 mm/hr — ABNORMAL HIGH (ref 0–30)

## 2019-11-09 LAB — C-REACTIVE PROTEIN: CRP: <1 mg/dL (ref 0.5–20.0)

## 2019-11-09 NOTE — Progress Notes (Signed)
Synopsis: Referred in September 2020 for DOE & cough by Martinique, Betty G, MD.  Subjective:   PATIENT ID: Molly Pyo GENDER: female DOB: 01/29/1933, MRN: [id number]  No chief complaint on file.   Molly [name] is an 86 y/o woman who presents for follow up after completing her PFTs and chest CT. She was hospitalized in December 2020 twice- once for pacemaker exchange and lead placement for cardiac resynchronization therapy. Following discharge she was readmitted for volume overload and was diuresed about 20# with an improvement in leg edema, abdominal distention, and her cough, which has resolved. Her SOB improved as well. She has a chronic history of fatigue, but feels that is stable. She has a history of chronic anemia and has been taking iron supplements.     OV 07/13/2019: Molly [name] is an 86 year old woman with a history of HFrEF, tachybrady syndrome with difficult to control atrial fibrillation, s/p PCCM placement in 2014.  In August 2020 she underwent an AV node ablation due to lack of other options to control her disease after trials on amiodarone, sotalol, dofetilide.  She presents today with her husband for evaluation of chronic dyspnea on exertion and cough.  She was referred by her cardiology team.  Since her ablation several months ago she has noticed fatigue and dyspnea on exertion.  The dyspnea is stable, not worsening.  She noticed a dry cough that began a month or 2 before her ablation.  Her husband relates that multiple pacemaker changes have been tried to alleviate her symptoms, but as her heart rate on her pacemaker is been dropped her symptoms have only worsened.  She has profound fatigue.  She has had adjustments to her diuretic dose, and when she gained weight her dose was increased again.  Recently that has been stable.  She denies fevers, chills, sweats, myalgias, proximal muscle weakness, synovitis, significant bleeding, or chest pain.  She has chronic back pain which is  unchanged.  She has been seeing dermatology for a pruritic rash, which has responded to prednisone but recurs after.  Her cough improved with low-dose prednisone that was prescribed for the rash, but also returned.  She denies postnasal drip.  She does not cough when she eats.  She is a history of GERD, which is currently controlled on a PPI.  She has frequent throat clearing which she attributes to wearing a mask.  She is a never smoker and she has never vape.  No illicits.  In the past her atrial fibrillation has been very difficult to control.  She was on amiodarone for several years, but stopped several years ago due to a problem.  She is not sure what the problem was, but she had PFTs and chest x-rays every 6 months at [facility] while she was on amiodarone.  Subsequently she was treated with both sotalol and Tikosyn, which were unable to control her arrhythmia, and ultimately she required an ablation earlier in 2020.  She has no previous history of lung disease or rheumatologic disease.  There is no family history of lung disease.  She has never taken Macrobid.  No history of radiation exposure.  She has never had pet birds.  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  Due to the persistence of her symptoms despite cardiology's interventions, they appropriately began looking for alternative causes of her dyspnea.  She has iron deficiency anemia.  She has been on warfarin for many years, and denies bleeding other  than occasional hemorrhoid associated rectal bleeding.  She started iron supplements last week.  She underwent a high-resolution CT which demonstrated basilar changes concerning for fibrosis.  She has not yet had her flu shot this season.  Per chart review, amiodarone was stopped in July 2013 after 10 years of use.  Subsequent notes suggest that she had an increased ESR while taking this medication.  In reviewing most of her cardiology notes from 2013 to present, almost  all of them note the patient's complaint of profound fatigue.       Past Medical History:  Diagnosis Date  . Aortic insufficiency   . Atrial tachycardia ([place])   . Cancer ([place])    Skin cancer- basal.  1 mylenoma  . Chronic combined systolic and diastolic CHF (congestive heart failure) ([place])   . CKD (chronic kidney disease), stage III   . Complication of anesthesia   . DCM (dilated cardiomyopathy) ([place])    EF 40-45% by echo 2017 - nuclear stress test with no ischemia  . GERD (gastroesophageal reflux disease)   . H/O benign essential tremor    on amiodarone resolved off amio  . H/O cardiac radiofrequency ablation    a. AV node ablation in 05/2019.  Marland Kitchen H/O hyperkalemia    Resolved off ACE inhibitors  . High cholesterol   . History of blood transfusion   . Hyperkalemia   . Hyperparathyroidism ([place])   . Hypertension   . Mitral regurgitation   . Persistent atrial fibrillation ([place])   . PONV (postoperative nausea and vomiting)   . PVC's (premature ventricular contractions)   . Tachycardia-bradycardia syndrome [facility])    s/p PPM 02/2013     Family History  Problem Relation Age of Onset  . Hypertension Mother   . Hypertension Father   . Breast cancer Sister      Past Surgical History:  Procedure Laterality Date  . AMPUTATION Right 11/27/2014   Procedure: RIGHT 2ND AND 3RD TOE AMPUTATION;  Surgeon: Wylene Simmer, MD;  Location: [facility];  Service: Orthopedics;  Laterality: Right;  . ATRIAL FIBRILLATION ABLATION N/A 05/29/2019   Procedure: AVN ABLATION;  Surgeon: Evans Lance, MD;  Location: [facility] CV LAB;  Service: Cardiovascular;  Laterality: N/A;  . BIV UPGRADE N/A 09/27/2019   Procedure: BIV UPGRADE;  Surgeon: Evans Lance, MD;  Location: [facility] CV LAB;  Service: Cardiovascular;  Laterality: N/A;  . BUNIONECTOMY     Right  . CARDIAC CATHETERIZATION  09/25/2000   EF of 50% -- with normal left ventricular size and function  . CARDIOVERSION N/A 12/28/2017   Procedure:  CARDIOVERSION;  Surgeon: Dorothy Spark, MD;  Location: [facility] ENDOSCOPY;  Service: Cardiovascular;  Laterality: N/A;  . CARDIOVERSION N/A 02/25/2018   Procedure: CARDIOVERSION;  Surgeon: Skeet Latch, MD;  Location: [facility];  Service: Cardiovascular;  Laterality: N/A;  . CESAREAN SECTION    . CESAREAN SECTION    . EYE SURGERY Bilateral    Cataract  . FOOT SURGERY    . INSERT / REPLACE / REMOVE PACEMAKER  02/2013  . PACEMAKER INSERTION  02/2013  . PERMANENT PACEMAKER INSERTION N/A 03/02/2013   Procedure: PERMANENT PACEMAKER INSERTION;  Surgeon: Evans Lance, MD;  Location: [facility] CATH LAB;  Service: Cardiovascular;  Laterality: N/A;  . SHOULDER ARTHROSCOPY W/ ROTATOR CUFF REPAIR    . SHOULDER SURGERY Right    roto cuff  . TOE AMPUTATION Left    2nd and 3rd, bunion under toes.  . TONSILLECTOMY    .  VARICOSE VEIN SURGERY      Social History   Socioeconomic History  . Marital status: Married    Spouse name: Not on file  . Number of children: 3  . Years of education: Not on file  . Highest education level: Not on file  Occupational History  . Not on file  Tobacco Use  . Smoking status: Never Smoker  . Smokeless tobacco: Never Used  Substance and Sexual Activity  . Alcohol use: No  . Drug use: No  . Sexual activity: Not Currently  Other Topics Concern  . Not on file  Social History Narrative  . Not on file   Social Determinants of Health   Financial Resource Strain:   . Difficulty of Paying Living Expenses: Not on file  Food Insecurity:   . Worried About Charity fundraiser in the Last Year: Not on file  . Ran Out of Food in the Last Year: Not on file  Transportation Needs:   . Lack of Transportation (Medical): Not on file  . Lack of Transportation (Non-Medical): Not on file  Physical Activity:   . Days of Exercise per Week: Not on file  . Minutes of Exercise per Session: Not on file  Stress:   . Feeling of Stress : Not on file  Social Connections:   .  Frequency of Communication with Friends and Family: Not on file  . Frequency of Social Gatherings with Friends and Family: Not on file  . Attends Religious Services: Not on file  . Active Member of Clubs or Organizations: Not on file  . Attends Archivist Meetings: Not on file  . Marital Status: Not on file  Intimate Partner Violence:   . Fear of Current or Ex-Partner: Not on file  . Emotionally Abused: Not on file  . Physically Abused: Not on file  . Sexually Abused: Not on file     Allergies  Allergen Reactions  . Ace Inhibitors     hyperkalemia  . Losartan     hyperkalemia  . Codeine Nausea Only  . Tramadol Nausea Only     Immunization History  Administered Date(s) Administered  . Influenza,inj,quad, With Preservative 11/02/2017  . Influenza-Unspecified 07/26/2018    Outpatient Medications Prior to Visit  Medication Sig Dispense Refill  . acetaminophen (TYLENOL) 500 MG tablet Take 1,000 mg by mouth 2 (two) times daily. For pain     . dorzolamide (TRUSOPT) 2 % ophthalmic solution Place 1 drop into the right eye 2 (two) times daily.    . ferrous sulfate 325 (65 FE) MG tablet Take 325 mg by mouth every other day. In the morning    . fluticasone (FLONASE) 50 MCG/ACT nasal spray Place 2 sprays into both nostrils daily. 16 [name] 2  . furosemide (LASIX) 40 MG tablet Take 1 tablet (40 mg total) by mouth daily. 90 tablet 3  . levothyroxine (SYNTHROID) 50 MCG tablet Take 0.5 tablets (25 mcg total) by mouth daily. 90 tablet 0  . LORazepam (ATIVAN) 0.5 MG tablet TAKE 1 TABLET (0.5 MG TOTAL) BY MOUTH AT BEDTIME. 30 tablet 2  . Multiple Vitamin (MULTIVITAMIN WITH MINERALS) TABS Take 1 tablet by mouth daily.    Marland Kitchen omeprazole (PRILOSEC) 20 MG capsule Take 20 mg by mouth every evening.     . warfarin (COUMADIN) 5 MG tablet Take 1/2 tablet daily or Take as directed by anticoagulation clinic 60 tablet 1   No facility-administered medications prior to visit.    Review of  Systems    Constitutional: Positive for malaise/fatigue. Negative for chills, fever and weight loss.  HENT: Negative for congestion and sore throat.        No postnasal drip  Eyes: Negative.   Respiratory: Positive for cough and shortness of breath. Negative for wheezing.   Cardiovascular: Positive for leg swelling. Negative for chest pain and palpitations.  Gastrointestinal: Positive for blood in stool, diarrhea and heartburn. Negative for melena, nausea and vomiting.  Genitourinary: Negative for dysuria and hematuria.       No vaginal bleeding  Musculoskeletal: Positive for back pain and joint pain. Negative for myalgias.       No synovitis  Skin: Positive for itching and rash.  Neurological: Negative for focal weakness and headaches.       No proximal weakness involving shoulders or hips     Objective:   There were no vitals filed for this visit.   on RA BMI Readings from Last 3 Encounters:  11/02/19 29.12 kg/m  10/06/19 32.25 kg/m  09/27/19 31.12 kg/m   Wt Readings from Last 3 Encounters:  11/02/19 175 lb (79.4 kg)  10/06/19 193 lb 12.8 oz (87.9 kg)  09/27/19 187 lb (84.8 kg)    Physical Exam Vitals reviewed.  Constitutional:      Appearance: Normal appearance.  HENT:     Head: Normocephalic and atraumatic.     Nose:     Comments: Deferred due to masking requirement.    Mouth/Throat:     Comments: Deferred due to masking requirement. Eyes:     General: No scleral icterus. Cardiovascular:     Rate and Rhythm: Normal rate. Rhythm irregular.     Heart sounds: Murmur present.  Pulmonary:     Comments: Breathing comfortably on RA, CTAB. Abdominal:     General: There is no distension.     Palpations: Abdomen is soft.     Tenderness: There is no abdominal tenderness.  Musculoskeletal:        General: No swelling or deformity.     Cervical back: Neck supple.  Lymphadenopathy:     Cervical: No cervical adenopathy.  Skin:    General: Skin is warm and dry.     Findings:  No rash.  Neurological:     General: No focal deficit present.     Mental Status: She is alert.     Motor: No weakness.     Coordination: Coordination normal.  Psychiatric:        Mood and Affect: Mood normal.        Behavior: Behavior normal.      CBC    Component Value Date/Time   WBC 7.5 11/02/2019 1555   WBC 6.0 08/15/2019 1009   RBC 4.14 11/02/2019 1555   RBC 3.42 (L) 08/15/2019 1009   HGB 13.0 11/02/2019 1555   HCT 39.5 11/02/2019 1555   PLT 179 11/02/2019 1555   MCV 95 11/02/2019 1555   MCH 31.4 11/02/2019 1555   MCH 30.7 05/27/2019 0004   MCHC 32.9 11/02/2019 1555   MCHC 31.9 08/15/2019 1009   RDW 19.6 (H) 11/02/2019 1555   LYMPHSABS 1.4 09/06/2019 1532   MONOABS 0.6 04/13/2019 1600   EOSABS 0.5 (H) 09/06/2019 1532   BASOSABS 0.0 09/06/2019 1532    CHEMISTRY Recent Labs  Lab 11/02/19 1555  NA 142  K 5.3*  CL 105  CO2 23  GLUCOSE 95  BUN 30*  CREATININE 1.17*  CALCIUM 11.6*   Estimated Creatinine Clearance: 37.3 mL/min (A) (  by C-[name] formula based on SCr of 1.17 mg/dL (H)).   Chest Imaging- films reviewed: CT chest 07/06/2019- mild pleural thickening with trivial dependent bilateral pleural effusions, 3 mm nodule in the lateral right middle lobe, scarring along lateral right major fissure.  Minimal groundglass with intralobular septal thickening in the most dependent regions of bilateral lower lobes.  No honeycombing or traction bronchiectasis.  Expiratory images without significant mosaicism to suggest regional air trapping.  CXR 09/28/2019-left-sided pacemaker with 3 leads in place.  Mildly increased opacification of the left lung throughout.  HRCT 11/01/2019- peripheral reticular changes without significant architectural distortion. Mildly improved in the bases compared to September 2020 imaging.  Mild air trapping bilaterally.  Possible mild RLL bronchiectasis, but tapers peripherally.   Pulmonary Functions Testing Results: PFT Results Latest Ref Rng &  Units 11/09/2019  FVC-Pre L 2.19  FVC-Predicted Pre % 89  FVC-Post L 2.15  FVC-Predicted Post % 88  Pre FEV1/FVC % % 86  Post FEV1/FCV % % 88  FEV1-Pre L 1.88  FEV1-Predicted Pre % 104  FEV1-Post L 1.90  DLCO UNC% % 62  DLCO COR %Predicted % 77  TLC L 4.02  TLC % Predicted % 79  RV % Predicted % 77   No significant obstruction, mild restriction. Mildly reduced DLCO (uncorrected).   2010 spirometry and DLCO No obstruction.  Elevated FVC and FEV1.  DLCO 50% of predicted, corrected for hemoglobin.  Uninterpretable flow volume loop. No other PFTs available in the media section.  09/02/2010 PFTs- markedly reduced DLCO, supranormal FVC and FEV1, preserved ratio.  Echocardiogram 05/26/2019: LVEF 30 to 35%, diffuse LV hypokinesis, moderately dilated left atrium.  Normal RV size and function.  Mildly dilated right atrium.  Moderate MR, mild AR without AS.  SPECT Myoperfusion scan 12/31/2015: Normal rest and stress perfusion-low risk study.  No exercise related arrhythmias or ischemic EKG changes.    Assessment & Plan:     ICD-10-CM   1. DOE (dyspnea on exertion)  R06.00   2. ILD (interstitial lung disease) (HCC)  J84.9 Sed Rate (ESR)    Sjogrens syndrome-B extractable nuclear antibody    Sjogrens syndrome-A extractable nuclear antibody    RNP Antibody    MyoMarker 3 Plus Profile (RDL)    Hypersensitivity Pneumonitis    Cyclic citrul peptide antibody, IgG    C-reactive protein    CK (Creatine Kinase)    Centromere Antibodies    Anti-Smith antibody    Anti-Scleroderma Antibody    Anti-DNA antibody, double-stranded    ANCA Screen Reflex Titer    ANA,IFA RA Diag Pnl w/rflx Tit/Patn    Aldolase    ILD, likely NSIP. Cannot rule out toxicity from previous amiodarone exposure. It has not progressed and may have improved over the past few months. No known rheumatologic history.   -reviewed CT chest with Molly [name] and her husband -ILD serologies to be collected today -will discuss her  case at ILD conference. At her age and without progression of symptoms or imaging abnormalities, she likely will benefit from observation rather than OLB. -Continue PPI daily  Cough-resolved with diuresis; likely was attributable to decompensated cardiac disease.   Iron deficiency anemia- resolved  -con't to monitor  We will contact her after ILD conference to let her know next steps for her care. She will follow up with Dr. Vaughan Browner.  30 min spent on this encounter, including records review, face to face time with the patient, and charting.   Current Outpatient Medications:  .  acetaminophen (TYLENOL) 500 MG tablet, Take 1,000 mg by mouth 2 (two) times daily. For pain , Disp: , Rfl:  .  dorzolamide (TRUSOPT) 2 % ophthalmic solution, Place 1 drop into the right eye 2 (two) times daily., Disp: , Rfl:  .  ferrous sulfate 325 (65 FE) MG tablet, Take 325 mg by mouth every other day. In the morning, Disp: , Rfl:  .  fluticasone (FLONASE) 50 MCG/ACT nasal spray, Place 2 sprays into both nostrils daily., Disp: 16 [name], Rfl: 2 .  furosemide (LASIX) 40 MG tablet, Take 1 tablet (40 mg total) by mouth daily., Disp: 90 tablet, Rfl: 3 .  levothyroxine (SYNTHROID) 50 MCG tablet, Take 0.5 tablets (25 mcg total) by mouth daily., Disp: 90 tablet, Rfl: 0 .  LORazepam (ATIVAN) 0.5 MG tablet, TAKE 1 TABLET (0.5 MG TOTAL) BY MOUTH AT BEDTIME., Disp: 30 tablet, Rfl: 2 .  Multiple Vitamin (MULTIVITAMIN WITH MINERALS) TABS, Take 1 tablet by mouth daily., Disp: , Rfl:  .  omeprazole (PRILOSEC) 20 MG capsule, Take 20 mg by mouth every evening. , Disp: , Rfl:  .  warfarin (COUMADIN) 5 MG tablet, Take 1/2 tablet daily or Take as directed by anticoagulation clinic, Disp: 60 tablet, Rfl: 1   Julian Hy, DO [facility] 11/09/2019 1:38 PM

## 2019-11-09 NOTE — Progress Notes (Signed)
PFT done today. 

## 2019-11-09 NOTE — Patient Instructions (Addendum)
Thank you for visiting Dr. Carlis Abbott at [facility]. We recommend the following:   We will call you about follow up with Dr. Vaughan Browner.   Please do your part to reduce the spread of COVID-19.   Covid 19 vaccine:  [place]: [id number] vaccinations begin Saturday at [place] [facility] [place] in [place] and are by appointment only. This will be for anyone 58 and older. People can be vaccinated at this time only if they register in advance. That can be done online at [email or web address] or by calling [phone].  [place]: [phone] and selecting Option 2 beginning Friday, January 8 at 8:00 AM. Appointments are required.  The locations are as follows: [employer], [place], [place], [place] [address] [facility], [address], [place], [place] [address] [place] at [facility], [address], [id number], [place], [place] [address] Please call [phone] and select Option 2 beginning Friday, January 8 at 8:00 AM. Appointments are required. Walk-ins will NOT be accepted.

## 2019-11-10 LAB — ANTI-SMITH ANTIBODY: ENA SM Ab Ser-aCnc: <1 AI

## 2019-11-13 ENCOUNTER — Telehealth: Payer: Self-pay

## 2019-11-13 DIAGNOSIS — N183 Chronic kidney disease, stage 3 unspecified: Secondary | ICD-10-CM | POA: Diagnosis not present

## 2019-11-13 DIAGNOSIS — I5043 Acute on chronic combined systolic (congestive) and diastolic (congestive) heart failure: Secondary | ICD-10-CM | POA: Diagnosis not present

## 2019-11-13 DIAGNOSIS — I420 Dilated cardiomyopathy: Secondary | ICD-10-CM | POA: Diagnosis not present

## 2019-11-13 DIAGNOSIS — I4819 Other persistent atrial fibrillation: Secondary | ICD-10-CM | POA: Diagnosis not present

## 2019-11-13 DIAGNOSIS — I130 Hypertensive heart and chronic kidney disease with heart failure and stage 1 through stage 4 chronic kidney disease, or unspecified chronic kidney disease: Secondary | ICD-10-CM | POA: Diagnosis not present

## 2019-11-13 DIAGNOSIS — M47815 Spondylosis without myelopathy or radiculopathy, thoracolumbar region: Secondary | ICD-10-CM | POA: Diagnosis not present

## 2019-11-13 LAB — ANCA SCREEN W REFLEX TITER: ANCA Screen: NEGATIVE

## 2019-11-13 LAB — HYPERSENSITIVITY PNEUMONITIS
A. Pullulans Abs: NEGATIVE
A.Fumigatus #1 Abs: NEGATIVE
Micropolyspora faeni, IgG: NEGATIVE
Pigeon Serum Abs: NEGATIVE
Thermoact. Saccharii: NEGATIVE
Thermoactinomyces vulgaris, IgG: NEGATIVE

## 2019-11-13 LAB — ANTI-SCLERODERMA ANTIBODY: Scleroderma (Scl-70) (ENA) Antibody, IgG: <1 AI

## 2019-11-13 LAB — ANA,IFA RA DIAG PNL W/RFLX TIT/PATN
Anti Nuclear Antibody (ANA): NEGATIVE
Cyclic Citrullin Peptide Ab: <16 UNITS
Rheumatoid fact SerPl-aCnc: <14 IU/mL (ref <14)

## 2019-11-13 LAB — ANTI-DNA ANTIBODY, DOUBLE-STRANDED: ds DNA Ab: <1 IU/mL

## 2019-11-13 LAB — SJOGRENS SYNDROME-B EXTRACTABLE NUCLEAR ANTIBODY: SSB (La) (ENA) Antibody, IgG: <1 AI

## 2019-11-13 LAB — CENTROMERE ANTIBODIES: Centromere Ab Screen: <1 AI

## 2019-11-13 LAB — RNP ANTIBODY: Ribonucleic Protein(ENA) Antibody, IgG: <1 AI

## 2019-11-13 LAB — SJOGRENS SYNDROME-A EXTRACTABLE NUCLEAR ANTIBODY: SSA (Ro) (ENA) Antibody, IgG: <1 AI

## 2019-11-13 LAB — ALDOLASE: Aldolase: 3.9 U/L (ref <=8.1)

## 2019-11-13 NOTE — Telephone Encounter (Signed)
I called and spoke with the patient to schedule her for next week and she prefers to come the first week of March. I advised her that we do not have that schedule out and she states to please call her once we have the March schedule available.

## 2019-11-13 NOTE — Telephone Encounter (Signed)
-----   Message from Marshell Garfinkel, MD sent at 11/07/2019  4:50 PM EST ----- Regarding: RE: ILD cases Daneil Dan,  Can you add to the next ILD conference list. I am happy to see them in clinic.   Danae Chen- can you schedule? Thanks  Praveen ----- Message ----- From: Julian Hy, DO Sent: 11/03/2019   1:34 PM EST To: Marshell Garfinkel, MD Subject: ILD cases                                      2 patients I wanted to hopefully have added to the ILD list whenever you have room to add them.  [name], Molly E [id number] Worsened symptoms despite escalating inhalers for presumed asthma- PFTs support stronger cause for mild restriction than obstruction. Normal diffusion. CT with mild peripheral reticular changes. All of her  rheum/ ILD labs negative    Molly, [name] Very symptomatic Previously stopped amiodarone by cards 2/2 SOB ILD lab workup ordered PFT still not done CT with some improvement in peripheral reticular changes, suggested alternative to UIP diagnossi.   My questions: should either of these undergo OLBx?  Do you have any interest in seeing one or both of these patients? I don't want you to feel like you have to, but you see much more ILD than I do.  Arby Barrette

## 2019-11-16 DIAGNOSIS — I4819 Other persistent atrial fibrillation: Secondary | ICD-10-CM | POA: Diagnosis not present

## 2019-11-16 DIAGNOSIS — I130 Hypertensive heart and chronic kidney disease with heart failure and stage 1 through stage 4 chronic kidney disease, or unspecified chronic kidney disease: Secondary | ICD-10-CM | POA: Diagnosis not present

## 2019-11-16 DIAGNOSIS — M47815 Spondylosis without myelopathy or radiculopathy, thoracolumbar region: Secondary | ICD-10-CM | POA: Diagnosis not present

## 2019-11-16 DIAGNOSIS — I420 Dilated cardiomyopathy: Secondary | ICD-10-CM | POA: Diagnosis not present

## 2019-11-16 DIAGNOSIS — I5043 Acute on chronic combined systolic (congestive) and diastolic (congestive) heart failure: Secondary | ICD-10-CM | POA: Diagnosis not present

## 2019-11-16 DIAGNOSIS — N183 Chronic kidney disease, stage 3 unspecified: Secondary | ICD-10-CM | POA: Diagnosis not present

## 2019-11-17 ENCOUNTER — Telehealth: Payer: Self-pay

## 2019-11-17 NOTE — Telephone Encounter (Signed)
Patient reports that she has no fever,no drainage from wound site, wound site is not warm to touch, and she has no edema at wound site. Patient scheduled to come in 11/22/19 for wound check. Instructed to call office if she has drainage from sight, fever, or change in appearance of incision site.

## 2019-11-17 NOTE — Telephone Encounter (Signed)
The pt states her incision site is still kind of red and she thinks she can see her stitches. She has an appointment on Monday to get blood work done. She wants to know can someone look at her when she is at the office?. I let her speak with device nurse Jenny Reichmann, Rn.

## 2019-11-20 ENCOUNTER — Other Ambulatory Visit: Payer: Medicare Other

## 2019-11-21 ENCOUNTER — Ambulatory Visit (INDEPENDENT_AMBULATORY_CARE_PROVIDER_SITE_OTHER): Payer: Medicare Other | Admitting: *Deleted

## 2019-11-21 ENCOUNTER — Other Ambulatory Visit: Payer: Medicare Other

## 2019-11-21 ENCOUNTER — Other Ambulatory Visit: Payer: Self-pay

## 2019-11-21 DIAGNOSIS — N183 Chronic kidney disease, stage 3 unspecified: Secondary | ICD-10-CM | POA: Diagnosis not present

## 2019-11-21 DIAGNOSIS — E875 Hyperkalemia: Secondary | ICD-10-CM | POA: Diagnosis not present

## 2019-11-21 DIAGNOSIS — Z950 Presence of cardiac pacemaker: Secondary | ICD-10-CM

## 2019-11-21 DIAGNOSIS — Z5181 Encounter for therapeutic drug level monitoring: Secondary | ICD-10-CM | POA: Diagnosis not present

## 2019-11-21 DIAGNOSIS — Z7901 Long term (current) use of anticoagulants: Secondary | ICD-10-CM

## 2019-11-21 DIAGNOSIS — I442 Atrioventricular block, complete: Secondary | ICD-10-CM

## 2019-11-21 LAB — POCT INR: INR: 1.3 — AB (ref 2.0–3.0)

## 2019-11-21 LAB — MYOMARKER 3 PLUS PROFILE (RDL)
Anti-EJ Ab (RDL): NEGATIVE
Anti-Jo-1 Ab (RDL): <20 Units (ref <20)
Anti-Ku Ab (RDL): NEGATIVE
Anti-MDA-5 Ab (CADM-140)(RDL): <20 Units (ref <20)
Anti-Mi-2 Ab (RDL): NEGATIVE
Anti-NXP-2 (P140) Ab (RDL): <20 Units (ref <20)
Anti-OJ Ab (RDL): NEGATIVE
Anti-PL-12 Ab (RDL: NEGATIVE
Anti-PL-7 Ab (RDL): NEGATIVE
Anti-PM/Scl-100 Ab (RDL): <20 Units (ref <20)
Anti-SAE1 Ab, IgG (RDL): <20 Units (ref <20)
Anti-SRP Ab (RDL): NEGATIVE
Anti-SS-A 52kD Ab, IgG (RDL): <20 Units (ref <20)
Anti-TIF-1gamma Ab (RDL): <20 Units (ref <20)
Anti-U1 RNP Ab (RDL): 31 Units — ABNORMAL HIGH (ref <20)
Anti-U2 RNP Ab (RDL): NEGATIVE
Anti-U3 RNP (Fibrillarin)(RDL): NEGATIVE

## 2019-11-21 NOTE — Patient Instructions (Signed)
Description   Take 1 tablet today and 1.5 tablets tomorrow, then start taking 1/2 tablet daily except a 1 tablet on Monday, Wednesday, and Friday.  Recheck in 1 week. Coumadin Clinic [phone]

## 2019-11-21 NOTE — Progress Notes (Signed)
Wound recheck in clinic for patient-reported stitch. Scabbed stitch removed from proximal incision corner. No drainage, redness, swelling, fever, or chills noted. No incisional opening noted, incision edges fully approximated and otherwise well healed. Patient educated to monitor for signs/symptoms of infection and call if any noted. [place] on 12/29/19 and ROV with Dr. [name] on 01/10/20.

## 2019-11-22 LAB — BASIC METABOLIC PANEL
BUN/Creatinine Ratio: 26 (ref 12–28)
BUN: 32 mg/dL — ABNORMAL HIGH (ref 8–27)
CO2: 24 mmol/L (ref 20–29)
Calcium: 11.2 mg/dL — ABNORMAL HIGH (ref 8.7–10.3)
Chloride: 103 mmol/L (ref 96–106)
Creatinine, Ser: 1.22 mg/dL — ABNORMAL HIGH (ref 0.57–1.00)
GFR calc Af Amer: 47 mL/min/{1.73_m2} — ABNORMAL LOW (ref >59)
GFR calc non Af Amer: 41 mL/min/{1.73_m2} — ABNORMAL LOW (ref >59)
Glucose: 87 mg/dL (ref 65–99)
Potassium: 5 mmol/L (ref 3.5–5.2)
Sodium: 140 mmol/L (ref 134–144)

## 2019-11-27 ENCOUNTER — Telehealth: Payer: Self-pay | Admitting: Critical Care Medicine

## 2019-11-27 DIAGNOSIS — I420 Dilated cardiomyopathy: Secondary | ICD-10-CM | POA: Diagnosis not present

## 2019-11-27 DIAGNOSIS — N183 Chronic kidney disease, stage 3 unspecified: Secondary | ICD-10-CM | POA: Diagnosis not present

## 2019-11-27 DIAGNOSIS — I4819 Other persistent atrial fibrillation: Secondary | ICD-10-CM | POA: Diagnosis not present

## 2019-11-27 DIAGNOSIS — M47815 Spondylosis without myelopathy or radiculopathy, thoracolumbar region: Secondary | ICD-10-CM | POA: Diagnosis not present

## 2019-11-27 DIAGNOSIS — I5043 Acute on chronic combined systolic (congestive) and diastolic (congestive) heart failure: Secondary | ICD-10-CM | POA: Diagnosis not present

## 2019-11-27 DIAGNOSIS — I130 Hypertensive heart and chronic kidney disease with heart failure and stage 1 through stage 4 chronic kidney disease, or unspecified chronic kidney disease: Secondary | ICD-10-CM | POA: Diagnosis not present

## 2019-11-27 NOTE — Telephone Encounter (Signed)
Spoke with patient. She was informed her of lab results about a week ago. She wanted to know since her lab results came back negative should she still schedule an appointment with Dr. Vaughan Browner. I advised her that I would ask Dr. Carlis Abbott. She verbalized understanding.   Dr. Carlis Abbott, please advise. Thanks!

## 2019-11-28 ENCOUNTER — Ambulatory Visit (INDEPENDENT_AMBULATORY_CARE_PROVIDER_SITE_OTHER): Payer: Medicare Other | Admitting: *Deleted

## 2019-11-28 ENCOUNTER — Other Ambulatory Visit: Payer: Self-pay

## 2019-11-28 DIAGNOSIS — Z7901 Long term (current) use of anticoagulants: Secondary | ICD-10-CM

## 2019-11-28 LAB — POCT INR: INR: 2 (ref 2.0–3.0)

## 2019-11-28 NOTE — Telephone Encounter (Signed)
Schedule for her 2 months from now with Dr. Vaughan Browner.  [facility]

## 2019-11-28 NOTE — Telephone Encounter (Signed)
Spoke with pt. She is aware of Dr. Ainsley Spinner response. Recall has been placed for 2 month OV with Dr. Vaughan Browner. Nothing further was needed.

## 2019-11-28 NOTE — Telephone Encounter (Signed)
Just to clarify, should she be scheduled with [place] now or wait until she is discussed at the ILD confrence?  Thanks.

## 2019-11-28 NOTE — Telephone Encounter (Signed)
That is fine. She is on the list to be discussed at ILD conference this month as well.  LPC

## 2019-11-28 NOTE — Patient Instructions (Signed)
Description   Today take 1 tablet continue taking 1/2 tablet daily except a 1 tablet on Monday, Wednesday, and Friday.  Recheck in 2 weeks. Coumadin Clinic [phone]

## 2019-11-29 DIAGNOSIS — N183 Chronic kidney disease, stage 3 unspecified: Secondary | ICD-10-CM | POA: Diagnosis not present

## 2019-11-29 DIAGNOSIS — I5043 Acute on chronic combined systolic (congestive) and diastolic (congestive) heart failure: Secondary | ICD-10-CM | POA: Diagnosis not present

## 2019-11-29 DIAGNOSIS — I420 Dilated cardiomyopathy: Secondary | ICD-10-CM | POA: Diagnosis not present

## 2019-11-29 DIAGNOSIS — I130 Hypertensive heart and chronic kidney disease with heart failure and stage 1 through stage 4 chronic kidney disease, or unspecified chronic kidney disease: Secondary | ICD-10-CM | POA: Diagnosis not present

## 2019-11-29 DIAGNOSIS — I4819 Other persistent atrial fibrillation: Secondary | ICD-10-CM | POA: Diagnosis not present

## 2019-11-29 DIAGNOSIS — M47815 Spondylosis without myelopathy or radiculopathy, thoracolumbar region: Secondary | ICD-10-CM | POA: Diagnosis not present

## 2019-12-05 ENCOUNTER — Ambulatory Visit: Payer: Self-pay | Admitting: Pulmonary Disease

## 2019-12-05 NOTE — Progress Notes (Signed)
   Interstitial Lung Disease Multidisciplinary Conference   Molly [name]    MRN [id number]    DOB [birth year]  Primary Care Physician:White, Caren Griffins, MD  Referring Physician: Dr. Ernest Mallick  Time of Conference: 7.30am- 8.30am Date of conference: 12/04/2018 Location of Conference: -  Virtual  Participating Pulmonary: Dr. Brand Males, MD,  Dr Marshell Garfinkel, MD Pathology: Dr Jaquita Folds, MD Radiology: Dr Salvatore Marvel MD Others:   Brief History:  86 year old with dyspnea on exertion, atrial fibrillation.  Patient was on amiodarone which was stopped in July 2013 years of use.  Serology:  11/09/2019-negative except for borderline U1 RNP  MDD discussion of CT scan     Date or time period of scan: 07/06/2019, 11/01/2019  - Features mentioned:  Mid to lower lung findings of mild groundglass attenuation with septal thickening.  No traction bronchiectasis or honeycombing.  Mild air trapping. Overall findings have improved from September 2020 to January 2021  - What is the final conclusion per 2018 ATS/Fleischner Criteria -indeterminate for UIP  Pathology discussion of biopsy: None  PFTs:  11/09/2019-mild restriction and diffusion defect.  No obstruction   MDD Impression/Recs:  Mild to minimal interstitial lung disease in indeterminate pattern.  Would recommend surveillance and no surgical lung biopsy at this point given improvement on CT scan and age of patient.   Time Spent in preparation and discussion:  > 30 min   SIGNATURE   Marshell Garfinkel MD [facility] 12/05/2019, 8:00 PM

## 2019-12-06 DIAGNOSIS — Z961 Presence of intraocular lens: Secondary | ICD-10-CM | POA: Diagnosis not present

## 2019-12-06 DIAGNOSIS — H401431 Capsular glaucoma with pseudoexfoliation of lens, bilateral, mild stage: Secondary | ICD-10-CM | POA: Diagnosis not present

## 2019-12-06 DIAGNOSIS — H52203 Unspecified astigmatism, bilateral: Secondary | ICD-10-CM | POA: Diagnosis not present

## 2019-12-11 DIAGNOSIS — N183 Chronic kidney disease, stage 3 unspecified: Secondary | ICD-10-CM | POA: Diagnosis not present

## 2019-12-11 DIAGNOSIS — E210 Primary hyperparathyroidism: Secondary | ICD-10-CM | POA: Diagnosis not present

## 2019-12-11 DIAGNOSIS — F419 Anxiety disorder, unspecified: Secondary | ICD-10-CM | POA: Diagnosis not present

## 2019-12-11 DIAGNOSIS — E039 Hypothyroidism, unspecified: Secondary | ICD-10-CM | POA: Diagnosis not present

## 2019-12-11 DIAGNOSIS — D509 Iron deficiency anemia, unspecified: Secondary | ICD-10-CM | POA: Diagnosis not present

## 2019-12-11 DIAGNOSIS — F329 Major depressive disorder, single episode, unspecified: Secondary | ICD-10-CM | POA: Diagnosis not present

## 2019-12-11 DIAGNOSIS — I129 Hypertensive chronic kidney disease with stage 1 through stage 4 chronic kidney disease, or unspecified chronic kidney disease: Secondary | ICD-10-CM | POA: Diagnosis not present

## 2019-12-11 DIAGNOSIS — J849 Interstitial pulmonary disease, unspecified: Secondary | ICD-10-CM | POA: Diagnosis not present

## 2019-12-11 DIAGNOSIS — Z950 Presence of cardiac pacemaker: Secondary | ICD-10-CM | POA: Diagnosis not present

## 2019-12-11 DIAGNOSIS — I4891 Unspecified atrial fibrillation: Secondary | ICD-10-CM | POA: Diagnosis not present

## 2019-12-13 ENCOUNTER — Telehealth: Payer: Self-pay

## 2019-12-13 NOTE — Telephone Encounter (Signed)
YOUR CARDIOLOGY TEAM HAS ARRANGED FOR AN E-VISIT FOR YOUR APPOINTMENT - PLEASE REVIEW IMPORTANT INFORMATION BELOW SEVERAL DAYS PRIOR TO YOUR APPOINTMENT  Due to the recent COVID-19 pandemic, we are transitioning in-person office visits to tele-medicine visits in an effort to decrease unnecessary exposure to our patients, their families, and staff. These visits are billed to your insurance just like a normal visit is. We also encourage you to sign up for MyChart if you have not already done so. You will need a smartphone if possible. For patients that do not have this, we can still complete the visit using a regular telephone but do prefer a smartphone to enable video when possible. You may have a family member that lives with you that can help. If possible, we also ask that you have a blood pressure cuff and scale at home to measure your blood pressure, heart rate and weight prior to your scheduled appointment. Patients with clinical needs that need an in-person evaluation and testing will still be able to come to the office if absolutely necessary. If you have any questions, feel free to call our office.   THE DAY OF YOUR APPOINTMENT  Approximately 15 minutes prior to your scheduled appointment, you will receive a telephone call from one of [facility] team - your caller ID may say "Unknown caller."  Our staff will confirm medications, vital signs for the day and any symptoms you may be experiencing. Please have this information available prior to the time of visit start. It may also be helpful for you to have a pad of paper and pen handy for any instructions given during your visit. They will also walk you through joining the smartphone meeting if this is a video visit.    CONSENT FOR TELE-HEALTH VISIT - PLEASE REVIEW  I hereby voluntarily request, consent and authorize [facility] and its employed or contracted physicians, physician assistants, nurse practitioners or other licensed health care  professionals (the Practitioner), to provide me with telemedicine health care services (the "Services") as deemed necessary by the treating Practitioner. I acknowledge and consent to receive the Services by the Practitioner via telemedicine. I understand that the telemedicine visit will involve communicating with the Practitioner through live audiovisual communication technology and the disclosure of certain medical information by electronic transmission. I acknowledge that I have been given the opportunity to request an in-person assessment or other available alternative prior to the telemedicine visit and am voluntarily participating in the telemedicine visit.  I understand that I have the right to withhold or withdraw my consent to the use of telemedicine in the course of my care at any time, without affecting my right to future care or treatment, and that the Practitioner or I may terminate the telemedicine visit at any time. I understand that I have the right to inspect all information obtained and/or recorded in the course of the telemedicine visit and may receive copies of available information for a reasonable fee.  I understand that some of the potential risks of receiving the Services via telemedicine include:  Marland Kitchen Delay or interruption in medical evaluation due to technological equipment failure or disruption; . Information transmitted may not be sufficient (e.g. poor resolution of images) to allow for appropriate medical decision making by the Practitioner; and/or  . In rare instances, security protocols could fail, causing a breach of personal health information.  Furthermore, I acknowledge that it is my responsibility to provide information about my medical history, conditions and care that is complete and  accurate to the best of my ability. I acknowledge that Practitioner's advice, recommendations, and/or decision may be based on factors not within their control, such as incomplete or inaccurate  data provided by me or distortions of diagnostic images or specimens that may result from electronic transmissions. I understand that the practice of medicine is not an exact science and that Practitioner makes no warranties or guarantees regarding treatment outcomes. I acknowledge that I will receive a copy of this consent concurrently upon execution via email to the email address I last provided but may also request a printed copy by calling the office of [facility].    I understand that my insurance will be billed for this visit.   I have read or had this consent read to me. . I understand the contents of this consent, which adequately explains the benefits and risks of the Services being provided via telemedicine.  . I have been provided ample opportunity to ask questions regarding this consent and the Services and have had my questions answered to my satisfaction. . I give my informed consent for the services to be provided through the use of telemedicine in my medical care  By participating in this telemedicine visit I agree to the above.

## 2019-12-14 ENCOUNTER — Telehealth: Payer: Medicare Other | Admitting: Cardiology

## 2019-12-14 ENCOUNTER — Other Ambulatory Visit: Payer: Self-pay

## 2019-12-14 ENCOUNTER — Telehealth: Payer: Self-pay | Admitting: Nurse Practitioner

## 2019-12-14 ENCOUNTER — Telehealth (INDEPENDENT_AMBULATORY_CARE_PROVIDER_SITE_OTHER): Payer: Medicare Other | Admitting: Cardiology

## 2019-12-14 ENCOUNTER — Encounter: Payer: Self-pay | Admitting: Cardiology

## 2019-12-14 VITALS — BP 121/78 | HR 70 | Ht 64.0 in | Wt 175.0 lb

## 2019-12-14 DIAGNOSIS — I495 Sick sinus syndrome: Secondary | ICD-10-CM

## 2019-12-14 DIAGNOSIS — I5042 Chronic combined systolic (congestive) and diastolic (congestive) heart failure: Secondary | ICD-10-CM

## 2019-12-14 DIAGNOSIS — R5383 Other fatigue: Secondary | ICD-10-CM | POA: Diagnosis not present

## 2019-12-14 DIAGNOSIS — I351 Nonrheumatic aortic (valve) insufficiency: Secondary | ICD-10-CM | POA: Diagnosis not present

## 2019-12-14 DIAGNOSIS — I493 Ventricular premature depolarization: Secondary | ICD-10-CM

## 2019-12-14 DIAGNOSIS — I420 Dilated cardiomyopathy: Secondary | ICD-10-CM | POA: Diagnosis not present

## 2019-12-14 DIAGNOSIS — I340 Nonrheumatic mitral (valve) insufficiency: Secondary | ICD-10-CM

## 2019-12-14 DIAGNOSIS — E7800 Pure hypercholesterolemia, unspecified: Secondary | ICD-10-CM | POA: Diagnosis not present

## 2019-12-14 DIAGNOSIS — N1831 Chronic kidney disease, stage 3a: Secondary | ICD-10-CM

## 2019-12-14 DIAGNOSIS — I4819 Other persistent atrial fibrillation: Secondary | ICD-10-CM | POA: Diagnosis not present

## 2019-12-14 DIAGNOSIS — I10 Essential (primary) hypertension: Secondary | ICD-10-CM

## 2019-12-14 NOTE — Telephone Encounter (Signed)
-----   Message from Sueanne Margarita, MD sent at 12/14/2019  3:38 PM EST ----- Lipids still not at goal.  Please forward to lipid clinic for further recommendations.

## 2019-12-14 NOTE — Progress Notes (Signed)
Virtual Visit via Telephone Note   This visit type was conducted due to national recommendations for restrictions regarding the COVID-19 Pandemic (e.g. social distancing) in an effort to limit this patient's exposure and mitigate transmission in our community.  Due to her co-morbid illnesses, this patient is at least at moderate risk for complications without adequate follow up.  This format is felt to be most appropriate for this patient at this time.  The patient did not have access to video technology/had technical difficulties with video requiring transitioning to audio format only (telephone).  All issues noted in this document were discussed and addressed.  No physical exam could be performed with this format.  Please refer to the patient's chart for her  consent to telehealth for [facility].  Evaluation Performed:  Follow-up visit  This visit type was conducted due to national recommendations for restrictions regarding the COVID-19 Pandemic (e.g. social distancing).  This format is felt to be most appropriate for this patient at this time.  All issues noted in this document were discussed and addressed.  No physical exam was performed (except for noted visual exam findings with Video Visits).  Please refer to the patient's chart (MyChart message for video visits and phone note for telephone visits) for the patient's consent to telehealth for [facility].  Date:  12/14/2019   ID:  Molly [name], DOB [birth year], MRN [id number]  Patient Location:  Home  Provider location:   [place]  PCP:  Harlan Stains, MD  Cardiologist:  Fransico Him, MD  Electrophysiologist:  None   Chief Complaint:  CHF  History of Present Illness:    Molly [name] is a 86 y.o. female who presents via audio/video conferencing for a telehealth visit today.    Molly [name] is a 86 y.o. female with a hx of chronic systolic and diastolic CHF (presumed NICM),tachy-brady syndrome s/p PPM implant  2014, persistent atrial fibrillations/p recent AVN ablation,CKD stage III, hyperparathyroidism, pacing induced LBBB s/p [employer]CRT-Pupgradeon 09/27/2019, chronic afib presents for LE edema.  She has long history of atrial fib going back many years and required PPM as above. In 2017 her echo showed reduction in EF to 40-45% with mild AI and mild-moderate MR, so nuclear stress test was pursued which showed no ischemia. Most recent echocardiogram in 05/26/19 showed further decline in EF to 30-35%, normal RVSP, moderate LAE, mild RAE, moderate mitral regurgitation, mild aortic regurgitation.Her afib has been difficult to control and she has been followed by EP and the afib clinic. She failedamiodarone, Tikosyn and sotalol in the past.She wasnot felt to be a good candidate for afib ablation given her severely dilated LA.   She was admitted 05/25/19 with worsening fatigue, dyspnea, and weight gain with mildly elevated BNP and uncontrolled atrial arrhythmias. She was treated with IV Lasix and underwentAV nodeablation 8/3/20and therefore HR is now dictated by her pacemaker. Her Lasix was stopped due to AKI. She has history of hyperkalemia with ACEI, ARB, and spironolactone and blood pressure has otherwise been prohibitive of aggressive med titration.She continued to volume overload despite above tx. Eventually underwentBoston ScientificCRT-Pupgradeon 09/27/2019. She was also evaluated by Pulmonary for cough.   Called 10/01/19 with increased LE edema. Increased lasix. Seen in office 10/06/19  for further evaluation.No improvement. Had edema on LLE with abdominal tightness and orthopnea and admitted to being compliant with medication and low sodium but had  about 8lb weight gain.Her lasix increased to 40 BID, and K+ 10 meq daily Cr up  very slightly.  Seen back 11/02/2019 and weight down 20lbs and she was feeling weak and tired.  LE edema had resolved.  Lasix decreased to 40mg  daily and Kdur  adjusted.    The patient does not have symptoms concerning for COVID-19 infection (fever, chills, cough, or new shortness of breath).    Prior CV studies:   The following studies were reviewed today:  Office visit notes from 10/06/2019 and 11/02/2019, labs  Past Medical History:  Diagnosis Date  . Aortic insufficiency   . Atrial tachycardia ([place])   . Cancer ([place])    Skin cancer- basal.  1 mylenoma  . Chronic combined systolic and diastolic CHF (congestive heart failure) ([place])   . CKD (chronic kidney disease), stage III   . Complication of anesthesia   . DCM (dilated cardiomyopathy) ([place])    EF 40-45% by echo 2017 - nuclear stress test with no ischemia  . GERD (gastroesophageal reflux disease)   . H/O benign essential tremor    on amiodarone resolved off amio  . H/O cardiac radiofrequency ablation    a. AV node ablation in 05/2019.  Marland Kitchen H/O hyperkalemia    Resolved off ACE inhibitors  . High cholesterol   . History of blood transfusion   . Hyperkalemia   . Hyperparathyroidism ([place])   . Hypertension   . Mitral regurgitation   . Persistent atrial fibrillation ([place])   . PONV (postoperative nausea and vomiting)   . PVC's (premature ventricular contractions)   . Tachycardia-bradycardia syndrome [facility])    s/p PPM 02/2013   Past Surgical History:  Procedure Laterality Date  . AMPUTATION Right 11/27/2014   Procedure: RIGHT 2ND AND 3RD TOE AMPUTATION;  Surgeon: Wylene Simmer, MD;  Location: [facility] [place];  Service: Orthopedics;  Laterality: Right;  . ATRIAL FIBRILLATION ABLATION N/A 05/29/2019   Procedure: AVN ABLATION;  Surgeon: Evans Lance, MD;  Location: [place] CV LAB;  Service: Cardiovascular;  Laterality: N/A;  . BIV UPGRADE N/A 09/27/2019   Procedure: BIV UPGRADE;  Surgeon: Evans Lance, MD;  Location: [place] CV [facility];  Service: Cardiovascular;  Laterality: N/A;  . BUNIONECTOMY     Right  . CARDIAC CATHETERIZATION  09/25/2000   EF of 50% -- with normal left ventricular size  and function  . CARDIOVERSION N/A 12/28/2017   Procedure: CARDIOVERSION;  Surgeon: Dorothy Spark, MD;  Location: [facility] ENDOSCOPY;  Service: Cardiovascular;  Laterality: N/A;  . CARDIOVERSION N/A 02/25/2018   Procedure: CARDIOVERSION;  Surgeon: Skeet Latch, MD;  Location: [facility];  Service: Cardiovascular;  Laterality: N/A;  . CESAREAN SECTION    . CESAREAN SECTION    . EYE SURGERY Bilateral    Cataract  . FOOT SURGERY    . INSERT / REPLACE / REMOVE PACEMAKER  02/2013  . PACEMAKER INSERTION  02/2013  . PERMANENT PACEMAKER INSERTION N/A 03/02/2013   Procedure: PERMANENT PACEMAKER INSERTION;  Surgeon: Evans Lance, MD;  Location: [facility] CATH LAB;  Service: Cardiovascular;  Laterality: N/A;  . SHOULDER ARTHROSCOPY W/ ROTATOR CUFF REPAIR    . SHOULDER SURGERY Right    roto cuff  . TOE AMPUTATION Left    2nd and 3rd, bunion under toes.  . TONSILLECTOMY    . VARICOSE VEIN SURGERY       Current Meds  Medication Sig  . acetaminophen (TYLENOL) 500 MG tablet Take 1,000 mg by mouth 2 (two) times daily. For pain   . dorzolamide (TRUSOPT) 2 % ophthalmic solution Place 1 drop into the  right eye 2 (two) times daily.  . ferrous sulfate 325 (65 FE) MG tablet Take 325 mg by mouth every other day. In the morning  . fluticasone (FLONASE) 50 MCG/ACT nasal spray Place 2 sprays into both nostrils daily.  . furosemide (LASIX) 40 MG tablet Take 1 tablet (40 mg total) by mouth daily.  Marland Kitchen levothyroxine (SYNTHROID) 50 MCG tablet Take 0.5 tablets (25 mcg total) by mouth daily.  Marland Kitchen LORazepam (ATIVAN) 0.5 MG tablet TAKE 1 TABLET (0.5 MG TOTAL) BY MOUTH AT BEDTIME.  . Multiple Vitamin (MULTIVITAMIN WITH MINERALS) TABS Take 1 tablet by mouth daily.  Marland Kitchen omeprazole (PRILOSEC) 20 MG capsule Take 20 mg by mouth every evening.   . simvastatin (ZOCOR) 10 MG tablet Take 10 mg by mouth daily.  Marland Kitchen warfarin (COUMADIN) 5 MG tablet Take 1/2 tablet daily or Take as directed by anticoagulation clinic     Allergies:   Ace  inhibitors, Losartan, Codeine, and Tramadol   Social History   Tobacco Use  . Smoking status: Never Smoker  . Smokeless tobacco: Never Used  Substance Use Topics  . Alcohol use: No  . Drug use: No     Family Hx: The patient's family history includes Breast cancer in her sister; Hypertension in her father and mother.  ROS:   Please see the history of present illness.     All other systems reviewed and are negative.   Labs/Other Tests and Data Reviewed:    Recent Labs: 05/28/2019: Magnesium 1.8 09/06/2019: ALT 16; BNP 381.0; TSH 6.510 10/16/2019: NT-Pro BNP 1,191 11/02/2019: Hemoglobin 13.0; Platelets 179 11/21/2019: BUN 32; Creatinine, Ser 1.22; Potassium 5.0; Sodium 140   Recent Lipid Panel Lab Results  Component Value Date/Time   CHOL 156 05/23/2012 05:14 AM   TRIG 86 05/23/2012 05:14 AM   HDL 80 05/23/2012 05:14 AM   CHOLHDL 2.0 05/23/2012 05:14 AM   LDLCALC 59 05/23/2012 05:14 AM    Wt Readings from Last 3 Encounters:  12/14/19 175 lb (79.4 kg)  11/09/19 174 lb (78.9 kg)  11/02/19 175 lb (79.4 kg)     Objective:    Vital Signs:  BP 121/78   Pulse 70   Ht 5\' 4"  (1.626 m)   Wt 175 lb (79.4 kg)   BMI 30.04 kg/m     ASSESSMENT & PLAN:    1.  Chronic combined systolic/diastolic CHF -she has not had any further problems with SOB -her LE edema is much improved and only trace by the end of the night -weight remains stable at 175lbs which was what her weight was at last OV in January -continue Lasix 40mg  daily -creatinine stable at 1.22 in 11/21/2019 -encouraged compliance with < 2gm Na diet -repeat 2D echo now that she has had BiV upgrade of her pacer  2.  Aortic insufficiency -mild by echo 04/2019  3.  Mitral regurgitation -moderate by echo 04/2019 -repeat echo to see if improved after BiV upgrade  4.  HTN -BP controlled on exam -she is not on any BP lowering meds  5.  HLD -followed by PCP  6.  Tachy-brady syndrome -s/p AVN ablation with PPM  and subsequent BiV upgrade -followed in device  7.  DCM -unclear etiology -EF 30-35% on echo 04/2019 -she is intolerant to ACE I and ARBs and BB have caused severe fatigue in the past -s/p BiV PPM upgrade and CHF sx much improved  8.  PVCs -her palpitations have resolved  9.  Stage 3 CKD -followed by PCP  10.  Persistent atrial fibrillation -now permanent and s/p AVN ablation -PPM dependent now -continue warfarin -denies any bleeding problems  11.  Persistent chronic fatigue -fatigue significantly improved after biV PPM upgrade  COVID-19 Education: The signs and symptoms of COVID-19 were discussed with the patient and how to seek care for testing (follow up with PCP or arrange E-visit).  The importance of social distancing was discussed today.  Patient Risk:   After full review of this patient's clinical status, I feel that they are at least moderate risk at this time.  Time:   Today, I have spent 20 minutes on telemedicine discussing medical problems including CHF, DCM, HTN, HLD, tachybrady, CKD and reviewing patient's chart including outside labs from PCP.  Medication Adjustments/Labs and Tests Ordered: Current medicines are reviewed at length with the patient today.  Concerns regarding medicines are outlined above.  Tests Ordered: No orders of the defined types were placed in this encounter.  Medication Changes: No orders of the defined types were placed in this encounter.   Disposition:  Follow up in 6 month(s)  Signed, Fransico Him, MD  12/14/2019 10:47 AM    [facility]

## 2019-12-14 NOTE — Telephone Encounter (Signed)
Called patient to review Dr. Theodosia Blender advice from her recent lab results from PCP. Patient states she was advised to start atorvastatin 10 mg and have repeat lab work in 2 weeks. She is not interested in coming into lipid clinic at this time and would like to try medication first. I asked if she has taken higher doses of atorvastatin and she denies. She asks about various treatments and the differences between medications. She states she is willing to take atorvastatin 20 mg and have lab work rechecked by Dr. Dema Severin and forward results to Dr. Radford Pax. She thanked me for the call.

## 2019-12-14 NOTE — Patient Instructions (Signed)
Medication Instructions:  Your physician recommends that you continue on your current medications as directed. Please refer to the Current Medication list given to you today.  *If you need a refill on your cardiac medications before your next appointment, please call your pharmacy*  Follow-Up: At [facility], you and your health needs are our priority.  As part of our continuing mission to provide you with exceptional heart care, we have created designated Provider Care Teams.  These Care Teams include your primary Cardiologist (physician) and Advanced Practice Providers (APPs -  Physician Assistants and Nurse Practitioners) who all work together to provide you with the care you need, when you need it.  Your next appointment:   6 month(s)  The format for your next appointment:   Either In Person or Virtual  Provider:   Fransico Him, MD

## 2019-12-19 ENCOUNTER — Ambulatory Visit (INDEPENDENT_AMBULATORY_CARE_PROVIDER_SITE_OTHER): Payer: Medicare Other | Admitting: *Deleted

## 2019-12-19 ENCOUNTER — Other Ambulatory Visit: Payer: Self-pay

## 2019-12-19 DIAGNOSIS — Z7901 Long term (current) use of anticoagulants: Secondary | ICD-10-CM | POA: Diagnosis not present

## 2019-12-19 LAB — POCT INR: INR: 1.7 — AB (ref 2.0–3.0)

## 2019-12-19 NOTE — Patient Instructions (Signed)
Description   Today take 1 tablet then start taking 1 tablet daily except a 1/2 tablet on Sundays, Tuesdays, and Thursdays.  Recheck in 2 weeks. Coumadin Clinic [phone]

## 2019-12-28 ENCOUNTER — Ambulatory Visit (INDEPENDENT_AMBULATORY_CARE_PROVIDER_SITE_OTHER): Payer: Medicare Other | Admitting: Pulmonary Disease

## 2019-12-28 ENCOUNTER — Encounter: Payer: Self-pay | Admitting: Pulmonary Disease

## 2019-12-28 ENCOUNTER — Other Ambulatory Visit: Payer: Self-pay

## 2019-12-28 VITALS — BP 112/68 | HR 70 | Temp 97.3°F | Ht 64.0 in | Wt 176.2 lb

## 2019-12-28 DIAGNOSIS — R9389 Abnormal findings on diagnostic imaging of other specified body structures: Secondary | ICD-10-CM

## 2019-12-28 DIAGNOSIS — R0609 Other forms of dyspnea: Secondary | ICD-10-CM

## 2019-12-28 DIAGNOSIS — R0600 Dyspnea, unspecified: Secondary | ICD-10-CM

## 2019-12-28 NOTE — Patient Instructions (Signed)
Glad you are doing well with regard to your breathing I had reviewed the CT scan which shows very minimal changes. I do not believe we need to do any intervention at this point and will continue to observe you Follow-up in 6 months with Dr. Carlis Abbott

## 2019-12-28 NOTE — Progress Notes (Signed)
Molly [name]    CY:[id number]    April 24, 1933  Primary Care Physician:[name], Molly Griffins, MD  Referring Physician: Harlan Stains, MD Molly [name],   [id number]  Chief complaint: Consult for ILD  HPI: 86 year old with CHF, tachycardia bradycardia syndrome, A. fib s/p pacemaker in 2014, AV nodal ablation in August 2020 Arrhythmias have been difficult to control and has been on amiodarone, sotalol, dofetilide in the past.  Last amiodarone drug use was many years ago, around 2013.  CT scan showed mild basal changes and has been referred to the ILD clinic for evaluation.  Last hospitalized in December 2020 with upgrade of her pacemaker to biventricular PPM.  Also underwent diuresis with increased dose of Lasix with improvement in lower extremity edema.  States that her breathing has improved significantly since this.  Still has mild dyspnea on exertion.  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  No mold, hot tub, Jacuzzi, humidifier.  No down pillows or comforters Smoking history: Never smoked Travel history: No significant travel Relevant family history: No significant family history of lung disease   Outpatient Encounter Medications as of 12/28/2019  Medication Sig  . acetaminophen (TYLENOL) 500 MG tablet Take 1,000 mg by mouth 2 (two) times daily. For pain   . atorvastatin (LIPITOR) 10 MG tablet Take 10 mg by mouth daily.  . dorzolamide (TRUSOPT) 2 % ophthalmic solution Place 1 drop into the right eye 2 (two) times daily.  . ferrous sulfate 325 (65 FE) MG tablet Take 325 mg by mouth every other day. In the morning  . fluticasone (FLONASE) 50 MCG/ACT nasal spray Place 2 sprays into both nostrils daily.  . furosemide (LASIX) 40 MG tablet Take 1 tablet (40 mg total) by  mouth daily.  Marland Kitchen levothyroxine (SYNTHROID) 50 MCG tablet Take 0.5 tablets (25 mcg total) by mouth daily.  Marland Kitchen LORazepam (ATIVAN) 0.5 MG tablet TAKE 1 TABLET (0.5 MG TOTAL) BY MOUTH AT BEDTIME.  . Multiple Vitamin (MULTIVITAMIN WITH MINERALS) TABS Take 1 tablet by mouth daily.  Marland Kitchen omeprazole (PRILOSEC) 20 MG capsule Take 20 mg by mouth every evening.   . warfarin (COUMADIN) 5 MG tablet Take 1/2 tablet daily or Take as directed by anticoagulation clinic   No facility-administered encounter medications on file as of 12/28/2019.    Physical Exam: Blood pressure 112/68, pulse 70, temperature (!) 97.3 F (36.3 C), temperature source Temporal, height 5\' 4"  (1.626 m), weight 176 lb 3.2 oz (79.9 kg), SpO2 97 %. Gen:      No acute distress HEENT:  EOMI, sclera anicteric Neck:     No masses; no thyromegaly Lungs:    Clear to auscultation bilaterally; normal respiratory effort CV:         Regular rate and rhythm; no murmurs Abd:      + bowel sounds; soft, non-tender; no palpable masses, no distension Ext:    No edema; adequate peripheral perfusion Skin:      Warm and dry; no rash Neuro: alert and oriented x 3 Psych: normal mood and affect  Data Reviewed: Imaging: High res CT Mid to lower lung findings of mild groundglass attenuation with septal thickening.  No traction bronchiectasis or honeycombing.  Mild air trapping. Overall findings have improved from September 2020 to January 2021  PFTs: 11/09/2019- FVC 2.15 [88%], FEV1 1.90 [95%], F/F 88, TLC 4.02 [79%], DLCO 11.56 [62%] mild restriction and diffusion defect.  No obstruction  Labs: CTD serologies 11/09/2019-negative except for borderline U1 RNP  Assessment:  Evaluation for ILD She has mild to minimal interstitial lung disease in indeterminate pattern.  This is nonspecific and could be from CHF Overall she has improved symptomatically after better management of her arrhythmias and heart failure  Case was discussed at multidisciplinary  conference on 12/05/2019 Recommend surveillance and no surgical lung biopsy at this point given improvement of symptoms, CT scan and age of patient Discussed in detail with patient who is also not interested in getting any invasive tests done.  Plan/Recommendations: Repeat HRCT in 1 year or sooner if there are worsening symptoms  Molly Garfinkel MD [facility] 12/28/2019, 3:29 PM  CC: Molly Stains, MD

## 2019-12-29 ENCOUNTER — Ambulatory Visit (INDEPENDENT_AMBULATORY_CARE_PROVIDER_SITE_OTHER): Payer: Medicare Other | Admitting: *Deleted

## 2019-12-29 DIAGNOSIS — I442 Atrioventricular block, complete: Secondary | ICD-10-CM | POA: Diagnosis not present

## 2019-12-29 LAB — CUP PACEART REMOTE DEVICE CHECK
Battery Remaining Longevity: 132 mo
Battery Remaining Percentage: 100 %
Brady Statistic RA Percent Paced: 0 %
Brady Statistic RV Percent Paced: 93 %
Date Time Interrogation Session: 20210305035400
Implantable Lead Implant Date: 20140508
Implantable Lead Implant Date: 20140508
Implantable Lead Implant Date: 20201202
Implantable Lead Location: 753858
Implantable Lead Location: 753859
Implantable Lead Location: 753860
Implantable Lead Model: 4135
Implantable Lead Model: 4136
Implantable Lead Model: 4674
Implantable Lead Serial Number: 29333020
Implantable Lead Serial Number: 29379474
Implantable Lead Serial Number: 851686
Implantable Pulse Generator Implant Date: 20201202
Lead Channel Impedance Value: 649 Ohm
Lead Channel Impedance Value: 738 Ohm
Lead Channel Pacing Threshold Amplitude: 0.9 V
Lead Channel Pacing Threshold Amplitude: 1.8 V
Lead Channel Pacing Threshold Pulse Width: 0.4 ms
Lead Channel Pacing Threshold Pulse Width: 0.4 ms
Lead Channel Setting Pacing Amplitude: 2 V
Lead Channel Setting Pacing Amplitude: 3.5 V
Lead Channel Setting Pacing Pulse Width: 0.4 ms
Lead Channel Setting Pacing Pulse Width: 0.4 ms
Lead Channel Setting Sensing Sensitivity: 2.5 mV
Lead Channel Setting Sensing Sensitivity: 3 mV
Pulse Gen Serial Number: 750525

## 2019-12-29 NOTE — Progress Notes (Signed)
PPM Remote  

## 2020-01-02 ENCOUNTER — Other Ambulatory Visit: Payer: Self-pay

## 2020-01-02 ENCOUNTER — Ambulatory Visit (INDEPENDENT_AMBULATORY_CARE_PROVIDER_SITE_OTHER): Payer: Medicare Other | Admitting: *Deleted

## 2020-01-02 DIAGNOSIS — Z7901 Long term (current) use of anticoagulants: Secondary | ICD-10-CM | POA: Diagnosis not present

## 2020-01-02 LAB — POCT INR: INR: 2.3 (ref 2.0–3.0)

## 2020-01-02 NOTE — Patient Instructions (Addendum)
Description   Continue taking 1 tablet daily except a 1/2 tablet on Sundays, Tuesdays, and Thursdays.  Recheck in 1 week with MD appt per pt request. [facility] [phone]

## 2020-01-09 DIAGNOSIS — D509 Iron deficiency anemia, unspecified: Secondary | ICD-10-CM | POA: Diagnosis not present

## 2020-01-10 ENCOUNTER — Ambulatory Visit (INDEPENDENT_AMBULATORY_CARE_PROVIDER_SITE_OTHER): Payer: Medicare Other | Admitting: Internal Medicine

## 2020-01-10 ENCOUNTER — Ambulatory Visit (INDEPENDENT_AMBULATORY_CARE_PROVIDER_SITE_OTHER): Payer: Medicare Other | Admitting: *Deleted

## 2020-01-10 ENCOUNTER — Other Ambulatory Visit: Payer: Self-pay

## 2020-01-10 VITALS — BP 126/78 | HR 70 | Ht 64.0 in | Wt 174.0 lb

## 2020-01-10 DIAGNOSIS — I442 Atrioventricular block, complete: Secondary | ICD-10-CM | POA: Diagnosis not present

## 2020-01-10 DIAGNOSIS — Z7901 Long term (current) use of anticoagulants: Secondary | ICD-10-CM

## 2020-01-10 DIAGNOSIS — I4819 Other persistent atrial fibrillation: Secondary | ICD-10-CM

## 2020-01-10 DIAGNOSIS — Z950 Presence of cardiac pacemaker: Secondary | ICD-10-CM

## 2020-01-10 DIAGNOSIS — I5042 Chronic combined systolic (congestive) and diastolic (congestive) heart failure: Secondary | ICD-10-CM | POA: Diagnosis not present

## 2020-01-10 LAB — POCT INR: INR: 2.3 (ref 2.0–3.0)

## 2020-01-10 NOTE — Patient Instructions (Signed)
Description   Continue taking 1 tablet daily except 1/2 tablet on Sundays, Tuesdays, and Thursdays.  Recheck in 3 weeks. [facility] [phone]

## 2020-01-10 NOTE — Progress Notes (Signed)
HPI Molly [name] returns today for followup. She is a pleasant 86 yo woman with a h/o CHB, s/p PPM with pacing induced LBBB and worsening CHF. She underwent biv PPM upgrade. Her QRS reduced from almost 200 ms to 150 after her upgrade. Since her procedure, she  Allergies  Allergen Reactions  . Ace Inhibitors     hyperkalemia  . Losartan     hyperkalemia  . Codeine Nausea Only  . Tramadol Nausea Only     Current Outpatient Medications  Medication Sig Dispense Refill  . acetaminophen (TYLENOL) 500 MG tablet Take 1,000 mg by mouth 2 (two) times daily. For pain     . atorvastatin (LIPITOR) 10 MG tablet Take 10 mg by mouth daily.    . dorzolamide (TRUSOPT) 2 % ophthalmic solution Place 1 drop into the right eye 2 (two) times daily.    . fluticasone (FLONASE) 50 MCG/ACT nasal spray Place 2 sprays into both nostrils daily. 16 g 2  . furosemide (LASIX) 40 MG tablet Take 1 tablet (40 mg total) by mouth daily. 90 tablet 3  . levothyroxine (SYNTHROID) 50 MCG tablet Take 0.5 tablets (25 mcg total) by mouth daily. 90 tablet 0  . LORazepam (ATIVAN) 0.5 MG tablet TAKE 1 TABLET (0.5 MG TOTAL) BY MOUTH AT BEDTIME. 30 tablet 2  . Multiple Vitamin (MULTIVITAMIN WITH MINERALS) TABS Take 1 tablet by mouth daily.    Marland Kitchen omeprazole (PRILOSEC) 20 MG capsule Take 20 mg by mouth every evening.     . warfarin (COUMADIN) 5 MG tablet Take 1/2 tablet daily or Take as directed by anticoagulation clinic 60 tablet 1   No current facility-administered medications for this visit.     Past Medical History:  Diagnosis Date  . Aortic insufficiency   . Atrial tachycardia ([place])   . Cancer ([place])    Skin cancer- basal.  1 mylenoma  . Chronic combined systolic and diastolic CHF (congestive heart failure) ([place])   . CKD (chronic kidney disease), stage III   . Complication of anesthesia   . DCM (dilated cardiomyopathy) ([place])    EF 40-45% by echo 2017 - nuclear stress test with no ischemia  . GERD (gastroesophageal  reflux disease)   . H/O benign essential tremor    on amiodarone resolved off amio  . H/O cardiac radiofrequency ablation    a. AV node ablation in 05/2019.  Marland Kitchen H/O hyperkalemia    Resolved off ACE inhibitors  . High cholesterol   . History of blood transfusion   . Hyperkalemia   . Hyperparathyroidism ([place])   . Hypertension   . Mitral regurgitation   . Persistent atrial fibrillation ([place])   . PONV (postoperative nausea and vomiting)   . PVC's (premature ventricular contractions)   . Tachycardia-bradycardia syndrome ([place])    s/p PPM 02/2013    ROS:   All systems reviewed and negative except as noted in the HPI.   Past Surgical History:  Procedure Laterality Date  . AMPUTATION Right 11/27/2014   Procedure: RIGHT 2ND AND 3RD TOE AMPUTATION;  Surgeon: Wylene Simmer, MD;  Location: [facility];  Service: Orthopedics;  Laterality: Right;  . ATRIAL FIBRILLATION ABLATION N/A 05/29/2019   Procedure: AVN ABLATION;  Surgeon: Evans Lance, MD;  Location: [facility];  Service: Cardiovascular;  Laterality: N/A;  . BIV UPGRADE N/A 09/27/2019   Procedure: BIV UPGRADE;  Surgeon: Evans Lance, MD;  Location: [facility] CV [facility];  Service: Cardiovascular;  Laterality: N/A;  .  BUNIONECTOMY     Right  . CARDIAC CATHETERIZATION  09/25/2000   EF of 50% -- with normal left ventricular size and function  . CARDIOVERSION N/A 12/28/2017   Procedure: CARDIOVERSION;  Surgeon: Dorothy Spark, MD;  Location: [facility] ENDOSCOPY;  Service: Cardiovascular;  Laterality: N/A;  . CARDIOVERSION N/A 02/25/2018   Procedure: CARDIOVERSION;  Surgeon: Skeet Latch, MD;  Location: [facility];  Service: Cardiovascular;  Laterality: N/A;  . CESAREAN SECTION    . CESAREAN SECTION    . EYE SURGERY Bilateral    Cataract  . FOOT SURGERY    . INSERT / REPLACE / REMOVE PACEMAKER  02/2013  . PACEMAKER INSERTION  02/2013  . PERMANENT PACEMAKER INSERTION N/A 03/02/2013   Procedure: PERMANENT PACEMAKER INSERTION;  Surgeon: Evans Lance, MD;  Location: [facility] CATH LAB;  Service: Cardiovascular;  Laterality: N/A;  . SHOULDER ARTHROSCOPY W/ ROTATOR CUFF REPAIR    . SHOULDER SURGERY Right    roto cuff  . TOE AMPUTATION Left    2nd and 3rd, bunion under toes.  . TONSILLECTOMY    . VARICOSE VEIN SURGERY       Family History  Problem Relation Age of Onset  . Hypertension Mother   . Hypertension Father   . Breast cancer Sister      Social History   Socioeconomic History  . Marital status: Married    Spouse name: Not on file  . Number of children: 3  . Years of education: Not on file  . Highest education level: Not on file  Occupational History  . Not on file  Tobacco Use  . Smoking status: Never Smoker  . Smokeless tobacco: Never Used  Substance and Sexual Activity  . Alcohol use: No  . Drug use: No  . Sexual activity: Not Currently  Other Topics Concern  . Not on file  Social History Narrative  . Not on file   Social Determinants of Health   Financial Resource Strain:   . Difficulty of Paying Living Expenses:   Food Insecurity:   . Worried About Charity fundraiser in the Last Year:   . Arboriculturist in the Last Year:   Transportation Needs:   . Film/video editor (Medical):   Marland Kitchen Lack of Transportation (Non-Medical):   Physical Activity:   . Days of Exercise per Week:   . Minutes of Exercise per Session:   Stress:   . Feeling of Stress :   Social Connections:   . Frequency of Communication with Friends and Family:   . Frequency of Social Gatherings with Friends and Family:   . Attends Religious Services:   . Active Member of Clubs or Organizations:   . Attends Archivist Meetings:   Marland Kitchen Marital Status:   Intimate Partner Violence:   . Fear of Current or Ex-Partner:   . Emotionally Abused:   Marland Kitchen Physically Abused:   . Sexually Abused:      BP 126/78   Pulse 70   Ht 5\' 4"  (1.626 m)   Wt 174 lb (78.9 kg)   SpO2 96%   BMI 29.87 kg/m   Physical Exam:  Well  appearing NAD HEENT: Unremarkable Neck:  No JVD, no thyromegally Lymphatics:  No adenopathy Back:  No CVA tenderness Lungs:  Clear with no wheezes; well healed PPM incision HEART:  Regular rate rhythm, no murmurs, no rubs, no clicks Abd:  soft, positive bowel sounds, no organomegally, no rebound, no guarding Ext:  2 plus pulses, no edema, no cyanosis, no clubbing Skin:  No rashes no nodules Neuro:  CN II through XII intact, motor grossly intact  EKG - atrial fib with biv pacing  DEVICE  Normal device function.  See PaceArt for details.   Assess/Plan: 1. Chronic systolic heart failure - she has undergone biv upgrade. Her QRS is much improved. She will continue her current meds and a low sodium diet. 2. CHB - she has no escape today. She is asymptomatic after PPM insertion. 3. AI/ MR - neither is thought to be severe. She will undergo watchful waiting.  4. HTN - her bp is well controlled. She will maintain a low sodium diet.   Mikle Bosworth.D.

## 2020-01-10 NOTE — Patient Instructions (Addendum)
Medication Instructions:  Your physician recommends that you continue on your current medications as directed. Please refer to the Current Medication list given to you today.  Labwork: None ordered.  Testing/Procedures: None ordered.  Follow-Up: Your physician wants you to follow-up in: one year with Dr. [name].   You will receive a reminder letter in the mail two months in advance. If you don't receive a letter, please call our office to schedule the follow-up appointment.  Remote monitoring is used to monitor your Pacemaker from home. This monitoring reduces the number of office visits required to check your device to one time per year. It allows [place] to keep an eye on the functioning of your device to ensure it is working properly. You are scheduled for a device check from home on 03/29/2020. You may send your transmission at any time that day. If you have a wireless device, the transmission will be sent automatically. After your physician reviews your transmission, you will receive a postcard with your next transmission date.  Any Other Special Instructions Will Be Listed Below (If Applicable).  If you need a refill on your cardiac medications before your next appointment, please call your pharmacy.

## 2020-01-23 DIAGNOSIS — E782 Mixed hyperlipidemia: Secondary | ICD-10-CM | POA: Diagnosis not present

## 2020-01-23 DIAGNOSIS — Z79899 Other long term (current) drug therapy: Secondary | ICD-10-CM | POA: Diagnosis not present

## 2020-01-30 ENCOUNTER — Other Ambulatory Visit: Payer: Self-pay

## 2020-01-30 ENCOUNTER — Ambulatory Visit (INDEPENDENT_AMBULATORY_CARE_PROVIDER_SITE_OTHER): Payer: Medicare Other | Admitting: *Deleted

## 2020-01-30 DIAGNOSIS — Z7901 Long term (current) use of anticoagulants: Secondary | ICD-10-CM

## 2020-01-30 LAB — POCT INR: INR: 2.5 (ref 2.0–3.0)

## 2020-01-30 NOTE — Patient Instructions (Signed)
Description   Continue taking 1 tablet daily except 1/2 tablet on Sundays, Tuesdays, and Thursdays.  Recheck in 4 weeks. Coumadin Clinic [phone]

## 2020-01-31 DIAGNOSIS — H401132 Primary open-angle glaucoma, bilateral, moderate stage: Secondary | ICD-10-CM | POA: Diagnosis not present

## 2020-02-10 ENCOUNTER — Other Ambulatory Visit: Payer: Self-pay | Admitting: Cardiology

## 2020-02-10 DIAGNOSIS — Z7901 Long term (current) use of anticoagulants: Secondary | ICD-10-CM

## 2020-02-27 ENCOUNTER — Ambulatory Visit (INDEPENDENT_AMBULATORY_CARE_PROVIDER_SITE_OTHER): Payer: Medicare Other | Admitting: *Deleted

## 2020-02-27 ENCOUNTER — Other Ambulatory Visit: Payer: Self-pay

## 2020-02-27 DIAGNOSIS — Z7901 Long term (current) use of anticoagulants: Secondary | ICD-10-CM | POA: Diagnosis not present

## 2020-02-27 DIAGNOSIS — Z5181 Encounter for therapeutic drug level monitoring: Secondary | ICD-10-CM

## 2020-02-27 LAB — POCT INR: INR: 2.4 (ref 2.0–3.0)

## 2020-02-27 NOTE — Patient Instructions (Signed)
Description   Continue taking 1 tablet daily except 1/2 tablet on Sundays, Tuesdays, and Thursdays.  Recheck in 6 weeks.  Coumadin Clinic [phone]    

## 2020-03-04 ENCOUNTER — Other Ambulatory Visit: Payer: Self-pay | Admitting: Family Medicine

## 2020-03-04 DIAGNOSIS — R946 Abnormal results of thyroid function studies: Secondary | ICD-10-CM

## 2020-03-15 ENCOUNTER — Other Ambulatory Visit: Payer: Self-pay | Admitting: Cardiology

## 2020-03-15 DIAGNOSIS — Z7901 Long term (current) use of anticoagulants: Secondary | ICD-10-CM

## 2020-03-29 ENCOUNTER — Ambulatory Visit (INDEPENDENT_AMBULATORY_CARE_PROVIDER_SITE_OTHER): Payer: Medicare Other | Admitting: *Deleted

## 2020-03-29 DIAGNOSIS — I442 Atrioventricular block, complete: Secondary | ICD-10-CM | POA: Diagnosis not present

## 2020-03-29 LAB — CUP PACEART REMOTE DEVICE CHECK
Battery Remaining Longevity: 132 mo
Battery Remaining Percentage: 100 %
Brady Statistic RA Percent Paced: 0 %
Brady Statistic RV Percent Paced: 94 %
Date Time Interrogation Session: 20210604034000
Implantable Lead Implant Date: 20140508
Implantable Lead Implant Date: 20140508
Implantable Lead Implant Date: 20201202
Implantable Lead Location: 753858
Implantable Lead Location: 753859
Implantable Lead Location: 753860
Implantable Lead Model: 4135
Implantable Lead Model: 4136
Implantable Lead Model: 4674
Implantable Lead Serial Number: 29333020
Implantable Lead Serial Number: 29379474
Implantable Lead Serial Number: 851686
Implantable Pulse Generator Implant Date: 20201202
Lead Channel Impedance Value: 640 Ohm
Lead Channel Impedance Value: 736 Ohm
Lead Channel Pacing Threshold Amplitude: 0.6 V
Lead Channel Pacing Threshold Amplitude: 0.9 V
Lead Channel Pacing Threshold Pulse Width: 0.4 ms
Lead Channel Pacing Threshold Pulse Width: 0.4 ms
Lead Channel Setting Pacing Amplitude: 2 V
Lead Channel Setting Pacing Amplitude: 2.5 V
Lead Channel Setting Pacing Pulse Width: 0.4 ms
Lead Channel Setting Pacing Pulse Width: 0.4 ms
Lead Channel Setting Sensing Sensitivity: 2.5 mV
Lead Channel Setting Sensing Sensitivity: 3 mV
Pulse Gen Serial Number: 750525

## 2020-04-02 NOTE — Progress Notes (Signed)
Remote pacemaker transmission.   

## 2020-04-09 ENCOUNTER — Ambulatory Visit (INDEPENDENT_AMBULATORY_CARE_PROVIDER_SITE_OTHER): Payer: Medicare Other | Admitting: *Deleted

## 2020-04-09 ENCOUNTER — Other Ambulatory Visit: Payer: Self-pay

## 2020-04-09 DIAGNOSIS — Z7901 Long term (current) use of anticoagulants: Secondary | ICD-10-CM

## 2020-04-09 DIAGNOSIS — Z5181 Encounter for therapeutic drug level monitoring: Secondary | ICD-10-CM

## 2020-04-09 LAB — POCT INR: INR: 1.6 — AB (ref 2.0–3.0)

## 2020-04-09 NOTE — Patient Instructions (Signed)
Description    Take 1 tablet today and 1.5 tablets tomorrow, then continue taking 1 tablet daily except 1/2 tablet on Sundays, Tuesdays, and Thursdays.  Recheck in 3 weeks- per request.  Coumadin Clinic [phone]

## 2020-04-30 ENCOUNTER — Ambulatory Visit (INDEPENDENT_AMBULATORY_CARE_PROVIDER_SITE_OTHER): Payer: Medicare Other

## 2020-04-30 ENCOUNTER — Other Ambulatory Visit: Payer: Self-pay

## 2020-04-30 DIAGNOSIS — Z7901 Long term (current) use of anticoagulants: Secondary | ICD-10-CM

## 2020-04-30 LAB — POCT INR: INR: 2.3 (ref 2.0–3.0)

## 2020-04-30 NOTE — Patient Instructions (Signed)
Continue taking 1 tablet daily except 1/2 tablet on Sundays, Tuesdays, and Thursdays.  Recheck in 4 weeks.  Coumadin Clinic [phone]

## 2020-05-27 LAB — CUP PACEART INCLINIC DEVICE CHECK
Date Time Interrogation Session: 20210317095418
Implantable Lead Implant Date: 20140508
Implantable Lead Implant Date: 20140508
Implantable Lead Implant Date: 20201202
Implantable Lead Location: 753858
Implantable Lead Location: 753859
Implantable Lead Location: 753860
Implantable Lead Model: 4135
Implantable Lead Model: 4136
Implantable Lead Model: 4674
Implantable Lead Serial Number: 29333020
Implantable Lead Serial Number: 29379474
Implantable Lead Serial Number: 851686
Implantable Pulse Generator Implant Date: 20201202
Pulse Gen Serial Number: 750525

## 2020-05-28 ENCOUNTER — Ambulatory Visit (INDEPENDENT_AMBULATORY_CARE_PROVIDER_SITE_OTHER): Payer: Medicare Other | Admitting: Pharmacist

## 2020-05-28 ENCOUNTER — Other Ambulatory Visit: Payer: Self-pay

## 2020-05-28 DIAGNOSIS — Z7901 Long term (current) use of anticoagulants: Secondary | ICD-10-CM | POA: Diagnosis not present

## 2020-05-28 LAB — POCT INR: INR: 2.3 (ref 2.0–3.0)

## 2020-05-28 NOTE — Patient Instructions (Signed)
Description   Continue taking 1 tablet daily except 1/2 tablet on Sundays, Tuesdays, and Thursdays.  Recheck in 6 weeks.  Coumadin Clinic [phone]    

## 2020-06-03 ENCOUNTER — Telehealth: Payer: Self-pay | Admitting: Cardiology

## 2020-06-03 DIAGNOSIS — R079 Chest pain, unspecified: Secondary | ICD-10-CM

## 2020-06-03 DIAGNOSIS — R0602 Shortness of breath: Secondary | ICD-10-CM

## 2020-06-03 NOTE — Telephone Encounter (Signed)
Spoke with pt, advised to send a manual transmission. Upon receipt of transmission, reviewed data.  No issues noted on transmission Device programmed VVIR- BiV, pacing about 95% of the time.   Presenting rhythm BiV paced 74bpm

## 2020-06-03 NOTE — Telephone Encounter (Signed)
  Pt c/o swelling: STAT is pt has developed SOB within 24 hours  1) How much weight have you gained and in what time span? Last 2 weeks over 5lbs  2) If swelling, where is the swelling located? no  3) Are you currently taking a fluid pill? yes  4) Are you currently SOB? no  5) Do you have a log of your daily weights (if so, list)? Normal weight was at 173 lbs, yeserday 178.2 lbs   6) Have you gained 3 pounds in a day or 5 pounds in a week? no  7) Have you traveled recently? no  Patient states she is feeling very tired for the past few weeks and has gained 5 lbs in about 2 weeks. She states she has no energy and even getting up to shower exhausts her. She states she get SOB when moving around. She states she is also having back problems.

## 2020-06-03 NOTE — Telephone Encounter (Signed)
Spoke with the patient who states that she has been feeling very tired for the past couple weeks. She reports that she has some lightheadedness and general fatigue. Does not feel like she is going to pass out.  She gets SOB with exertion with some slight chest discomfort. She states that last week her BP got up to 154/100 but other wise has been good. Today BP 100/80. She is taking Lasix 40 mg daily. She states that she does not have any swelling but has gained about 5 lbs over the past couple weeks.  Reports that she is not urinating as frequently. She denies any increase in salt intake.  Patient is overdue for 6 month FU. Last echo was 04/2019.  Patient has a pacemaker and is also requesting if her device could be checked. Will route to device team.

## 2020-06-04 DIAGNOSIS — C44722 Squamous cell carcinoma of skin of right lower limb, including hip: Secondary | ICD-10-CM | POA: Diagnosis not present

## 2020-06-04 DIAGNOSIS — D485 Neoplasm of uncertain behavior of skin: Secondary | ICD-10-CM | POA: Diagnosis not present

## 2020-06-04 DIAGNOSIS — C44629 Squamous cell carcinoma of skin of left upper limb, including shoulder: Secondary | ICD-10-CM | POA: Diagnosis not present

## 2020-06-04 DIAGNOSIS — C44719 Basal cell carcinoma of skin of left lower limb, including hip: Secondary | ICD-10-CM | POA: Diagnosis not present

## 2020-06-04 DIAGNOSIS — L820 Inflamed seborrheic keratosis: Secondary | ICD-10-CM | POA: Diagnosis not present

## 2020-06-04 DIAGNOSIS — L309 Dermatitis, unspecified: Secondary | ICD-10-CM | POA: Diagnosis not present

## 2020-06-04 DIAGNOSIS — L821 Other seborrheic keratosis: Secondary | ICD-10-CM | POA: Diagnosis not present

## 2020-06-04 DIAGNOSIS — Z85828 Personal history of other malignant neoplasm of skin: Secondary | ICD-10-CM | POA: Diagnosis not present

## 2020-06-04 DIAGNOSIS — L909 Atrophic disorder of skin, unspecified: Secondary | ICD-10-CM | POA: Diagnosis not present

## 2020-06-04 NOTE — Telephone Encounter (Signed)
Patient's device does not have ability to measure fluid levels.

## 2020-06-04 NOTE — Telephone Encounter (Signed)
I spoke with patient and gave her information from Dr Radford Pax.  She will come in tomorrow for lab work.  I verbally went over [place] instructions with her.  Patient aware she will be contacted with stress test appointment information.

## 2020-06-04 NOTE — Telephone Encounter (Signed)
Please find out if we can get an optivol on her pacer.  If no then have her come in for BMET and BNP.

## 2020-06-04 NOTE — Telephone Encounter (Signed)
From Dr Radford Pax-  Please see if device clinic can get an optivol reading on patient's pacer and if no then get a BMET and BNP. ALso she is complainig of CP and SOB. Please get a [employer]

## 2020-06-05 ENCOUNTER — Other Ambulatory Visit: Payer: Medicare Other | Admitting: *Deleted

## 2020-06-05 ENCOUNTER — Other Ambulatory Visit: Payer: Self-pay

## 2020-06-05 DIAGNOSIS — R079 Chest pain, unspecified: Secondary | ICD-10-CM | POA: Diagnosis not present

## 2020-06-05 DIAGNOSIS — R0602 Shortness of breath: Secondary | ICD-10-CM

## 2020-06-06 LAB — PRO B NATRIURETIC PEPTIDE: NT-Pro BNP: 877 pg/mL — ABNORMAL HIGH (ref 0–738)

## 2020-06-06 LAB — BASIC METABOLIC PANEL
BUN/Creatinine Ratio: 21 (ref 12–28)
BUN: 23 mg/dL (ref 8–27)
CO2: 24 mmol/L (ref 20–29)
Calcium: 10.5 mg/dL — ABNORMAL HIGH (ref 8.7–10.3)
Chloride: 103 mmol/L (ref 96–106)
Creatinine, Ser: 1.11 mg/dL — ABNORMAL HIGH (ref 0.57–1.00)
GFR calc Af Amer: 52 mL/min/{1.73_m2} — ABNORMAL LOW (ref >59)
GFR calc non Af Amer: 45 mL/min/{1.73_m2} — ABNORMAL LOW (ref >59)
Glucose: 122 mg/dL — ABNORMAL HIGH (ref 65–99)
Potassium: 5.1 mmol/L (ref 3.5–5.2)
Sodium: 138 mmol/L (ref 134–144)

## 2020-06-07 ENCOUNTER — Telehealth: Payer: Self-pay | Admitting: Cardiology

## 2020-06-07 NOTE — Telephone Encounter (Signed)
     I went in pt's chart to see who called her today. 

## 2020-06-12 ENCOUNTER — Other Ambulatory Visit: Payer: Self-pay | Admitting: Cardiology

## 2020-06-12 ENCOUNTER — Telehealth (HOSPITAL_COMMUNITY): Payer: Self-pay | Admitting: Cardiology

## 2020-06-12 ENCOUNTER — Telehealth (HOSPITAL_COMMUNITY): Payer: Self-pay

## 2020-06-12 DIAGNOSIS — Z7901 Long term (current) use of anticoagulants: Secondary | ICD-10-CM

## 2020-06-12 NOTE — Telephone Encounter (Signed)
Patient stated that she wanted to cancel her appointment, her back was in bad shape and she would call back to reschedule. S.Wiliams EMTP

## 2020-06-12 NOTE — Telephone Encounter (Signed)
Patient cancelled due to she has a hurt back and will call us later when she is ready to reschedule.  Order will be removed from the [place] and when pt calls back to schedule we will reinstate the order. Thank you.

## 2020-06-18 ENCOUNTER — Encounter (HOSPITAL_COMMUNITY): Payer: Medicare Other

## 2020-06-19 DIAGNOSIS — R29898 Other symptoms and signs involving the musculoskeletal system: Secondary | ICD-10-CM | POA: Diagnosis not present

## 2020-06-28 ENCOUNTER — Ambulatory Visit (INDEPENDENT_AMBULATORY_CARE_PROVIDER_SITE_OTHER): Payer: Medicare Other | Admitting: *Deleted

## 2020-06-28 DIAGNOSIS — I442 Atrioventricular block, complete: Secondary | ICD-10-CM

## 2020-06-29 LAB — CUP PACEART REMOTE DEVICE CHECK
Battery Remaining Longevity: 132 mo
Battery Remaining Percentage: 100 %
Brady Statistic RA Percent Paced: 0 %
Brady Statistic RV Percent Paced: 96 %
Date Time Interrogation Session: 20210903035400
Implantable Lead Implant Date: 20140508
Implantable Lead Implant Date: 20140508
Implantable Lead Implant Date: 20201202
Implantable Lead Location: 753858
Implantable Lead Location: 753859
Implantable Lead Location: 753860
Implantable Lead Model: 4135
Implantable Lead Model: 4136
Implantable Lead Model: 4674
Implantable Lead Serial Number: 29333020
Implantable Lead Serial Number: 29379474
Implantable Lead Serial Number: 851686
Implantable Pulse Generator Implant Date: 20201202
Lead Channel Impedance Value: 663 Ohm
Lead Channel Impedance Value: 768 Ohm
Lead Channel Pacing Threshold Amplitude: 0.6 V
Lead Channel Pacing Threshold Amplitude: 1 V
Lead Channel Pacing Threshold Pulse Width: 0.4 ms
Lead Channel Pacing Threshold Pulse Width: 0.4 ms
Lead Channel Setting Pacing Amplitude: 2 V
Lead Channel Setting Pacing Amplitude: 2.5 V
Lead Channel Setting Pacing Pulse Width: 0.4 ms
Lead Channel Setting Pacing Pulse Width: 0.4 ms
Lead Channel Setting Sensing Sensitivity: 2.5 mV
Lead Channel Setting Sensing Sensitivity: 3 mV
Pulse Gen Serial Number: 750525

## 2020-07-02 NOTE — Progress Notes (Signed)
Remote pacemaker transmission.   

## 2020-07-08 DIAGNOSIS — M545 Low back pain: Secondary | ICD-10-CM | POA: Diagnosis not present

## 2020-07-08 DIAGNOSIS — R030 Elevated blood-pressure reading, without diagnosis of hypertension: Secondary | ICD-10-CM | POA: Diagnosis not present

## 2020-07-08 DIAGNOSIS — Z6830 Body mass index (BMI) 30.0-30.9, adult: Secondary | ICD-10-CM | POA: Diagnosis not present

## 2020-07-08 DIAGNOSIS — M4120 Other idiopathic scoliosis, site unspecified: Secondary | ICD-10-CM | POA: Diagnosis not present

## 2020-07-08 DIAGNOSIS — G8929 Other chronic pain: Secondary | ICD-10-CM | POA: Diagnosis not present

## 2020-07-15 ENCOUNTER — Other Ambulatory Visit: Payer: Self-pay

## 2020-07-15 ENCOUNTER — Ambulatory Visit (INDEPENDENT_AMBULATORY_CARE_PROVIDER_SITE_OTHER): Payer: Medicare Other | Admitting: *Deleted

## 2020-07-15 DIAGNOSIS — I4819 Other persistent atrial fibrillation: Secondary | ICD-10-CM

## 2020-07-15 DIAGNOSIS — Z7901 Long term (current) use of anticoagulants: Secondary | ICD-10-CM | POA: Diagnosis not present

## 2020-07-15 DIAGNOSIS — I471 Supraventricular tachycardia: Secondary | ICD-10-CM | POA: Diagnosis not present

## 2020-07-15 LAB — POCT INR: INR: 2.9 (ref 2.0–3.0)

## 2020-07-15 NOTE — Patient Instructions (Signed)
Description   Continue taking 1 tablet daily except 1/2 tablet on Sundays, Tuesdays, and Thursdays.  Recheck in 6 weeks.  Coumadin Clinic [phone]

## 2020-07-20 DIAGNOSIS — I110 Hypertensive heart disease with heart failure: Secondary | ICD-10-CM | POA: Diagnosis not present

## 2020-07-20 DIAGNOSIS — I4891 Unspecified atrial fibrillation: Secondary | ICD-10-CM | POA: Diagnosis not present

## 2020-07-20 DIAGNOSIS — E039 Hypothyroidism, unspecified: Secondary | ICD-10-CM | POA: Diagnosis not present

## 2020-07-20 DIAGNOSIS — I509 Heart failure, unspecified: Secondary | ICD-10-CM | POA: Diagnosis not present

## 2020-07-20 DIAGNOSIS — Z950 Presence of cardiac pacemaker: Secondary | ICD-10-CM | POA: Diagnosis not present

## 2020-07-20 DIAGNOSIS — Z7901 Long term (current) use of anticoagulants: Secondary | ICD-10-CM | POA: Diagnosis not present

## 2020-07-20 DIAGNOSIS — M25561 Pain in right knee: Secondary | ICD-10-CM | POA: Diagnosis not present

## 2020-07-20 DIAGNOSIS — M5136 Other intervertebral disc degeneration, lumbar region: Secondary | ICD-10-CM | POA: Diagnosis not present

## 2020-07-20 DIAGNOSIS — M4120 Other idiopathic scoliosis, site unspecified: Secondary | ICD-10-CM | POA: Diagnosis not present

## 2020-07-20 DIAGNOSIS — M5126 Other intervertebral disc displacement, lumbar region: Secondary | ICD-10-CM | POA: Diagnosis not present

## 2020-07-20 DIAGNOSIS — G8929 Other chronic pain: Secondary | ICD-10-CM | POA: Diagnosis not present

## 2020-07-24 DIAGNOSIS — G8929 Other chronic pain: Secondary | ICD-10-CM | POA: Diagnosis not present

## 2020-07-24 DIAGNOSIS — M545 Low back pain: Secondary | ICD-10-CM | POA: Diagnosis not present

## 2020-07-24 DIAGNOSIS — M4120 Other idiopathic scoliosis, site unspecified: Secondary | ICD-10-CM | POA: Diagnosis not present

## 2020-07-25 DIAGNOSIS — I4891 Unspecified atrial fibrillation: Secondary | ICD-10-CM | POA: Diagnosis not present

## 2020-07-25 DIAGNOSIS — I509 Heart failure, unspecified: Secondary | ICD-10-CM | POA: Diagnosis not present

## 2020-07-25 DIAGNOSIS — I110 Hypertensive heart disease with heart failure: Secondary | ICD-10-CM | POA: Diagnosis not present

## 2020-07-25 DIAGNOSIS — M5136 Other intervertebral disc degeneration, lumbar region: Secondary | ICD-10-CM | POA: Diagnosis not present

## 2020-07-25 DIAGNOSIS — G8929 Other chronic pain: Secondary | ICD-10-CM | POA: Diagnosis not present

## 2020-07-25 DIAGNOSIS — M5126 Other intervertebral disc displacement, lumbar region: Secondary | ICD-10-CM | POA: Diagnosis not present

## 2020-07-29 DIAGNOSIS — M1711 Unilateral primary osteoarthritis, right knee: Secondary | ICD-10-CM | POA: Diagnosis not present

## 2020-07-29 DIAGNOSIS — R6889 Other general symptoms and signs: Secondary | ICD-10-CM | POA: Diagnosis not present

## 2020-07-29 DIAGNOSIS — M1712 Unilateral primary osteoarthritis, left knee: Secondary | ICD-10-CM | POA: Diagnosis not present

## 2020-07-29 DIAGNOSIS — M25561 Pain in right knee: Secondary | ICD-10-CM | POA: Diagnosis not present

## 2020-07-30 ENCOUNTER — Ambulatory Visit: Payer: Medicare Other | Admitting: Cardiology

## 2020-07-30 DIAGNOSIS — M5136 Other intervertebral disc degeneration, lumbar region: Secondary | ICD-10-CM | POA: Diagnosis not present

## 2020-07-30 DIAGNOSIS — I509 Heart failure, unspecified: Secondary | ICD-10-CM | POA: Diagnosis not present

## 2020-07-30 DIAGNOSIS — M170 Bilateral primary osteoarthritis of knee: Secondary | ICD-10-CM | POA: Diagnosis not present

## 2020-07-30 DIAGNOSIS — M5126 Other intervertebral disc displacement, lumbar region: Secondary | ICD-10-CM | POA: Diagnosis not present

## 2020-07-30 DIAGNOSIS — G8929 Other chronic pain: Secondary | ICD-10-CM | POA: Diagnosis not present

## 2020-07-30 DIAGNOSIS — I110 Hypertensive heart disease with heart failure: Secondary | ICD-10-CM | POA: Diagnosis not present

## 2020-07-30 DIAGNOSIS — I4891 Unspecified atrial fibrillation: Secondary | ICD-10-CM | POA: Diagnosis not present

## 2020-07-30 DIAGNOSIS — L299 Pruritus, unspecified: Secondary | ICD-10-CM | POA: Diagnosis not present

## 2020-08-01 DIAGNOSIS — M5136 Other intervertebral disc degeneration, lumbar region: Secondary | ICD-10-CM | POA: Diagnosis not present

## 2020-08-01 DIAGNOSIS — I4891 Unspecified atrial fibrillation: Secondary | ICD-10-CM | POA: Diagnosis not present

## 2020-08-01 DIAGNOSIS — G8929 Other chronic pain: Secondary | ICD-10-CM | POA: Diagnosis not present

## 2020-08-01 DIAGNOSIS — I110 Hypertensive heart disease with heart failure: Secondary | ICD-10-CM | POA: Diagnosis not present

## 2020-08-01 DIAGNOSIS — M5126 Other intervertebral disc displacement, lumbar region: Secondary | ICD-10-CM | POA: Diagnosis not present

## 2020-08-01 DIAGNOSIS — I509 Heart failure, unspecified: Secondary | ICD-10-CM | POA: Diagnosis not present

## 2020-08-05 DIAGNOSIS — M1712 Unilateral primary osteoarthritis, left knee: Secondary | ICD-10-CM | POA: Diagnosis not present

## 2020-08-06 DIAGNOSIS — G8929 Other chronic pain: Secondary | ICD-10-CM | POA: Diagnosis not present

## 2020-08-06 DIAGNOSIS — M5136 Other intervertebral disc degeneration, lumbar region: Secondary | ICD-10-CM | POA: Diagnosis not present

## 2020-08-06 DIAGNOSIS — I509 Heart failure, unspecified: Secondary | ICD-10-CM | POA: Diagnosis not present

## 2020-08-06 DIAGNOSIS — M5126 Other intervertebral disc displacement, lumbar region: Secondary | ICD-10-CM | POA: Diagnosis not present

## 2020-08-06 DIAGNOSIS — I110 Hypertensive heart disease with heart failure: Secondary | ICD-10-CM | POA: Diagnosis not present

## 2020-08-06 DIAGNOSIS — I4891 Unspecified atrial fibrillation: Secondary | ICD-10-CM | POA: Diagnosis not present

## 2020-08-07 ENCOUNTER — Telehealth: Payer: Self-pay | Admitting: Internal Medicine

## 2020-08-07 NOTE — Telephone Encounter (Signed)
Patient unsure of exactly what type of therapy they are recommending, she knows it is not a TENS unit but the therapist indicated it is a some sort of stimulator.  Provided her with fax number to have therapist send if on exactly what treatment is so we can determine if compatible with pacemaker or not

## 2020-08-07 NOTE — Telephone Encounter (Signed)
   Pt said, she is having back problems and her pcp is trying to get her pain management but needs an approval from Dr. [name] due to her pacemaker.

## 2020-08-08 DIAGNOSIS — G8929 Other chronic pain: Secondary | ICD-10-CM | POA: Diagnosis not present

## 2020-08-08 DIAGNOSIS — M5126 Other intervertebral disc displacement, lumbar region: Secondary | ICD-10-CM | POA: Diagnosis not present

## 2020-08-08 DIAGNOSIS — I4891 Unspecified atrial fibrillation: Secondary | ICD-10-CM | POA: Diagnosis not present

## 2020-08-08 DIAGNOSIS — M5136 Other intervertebral disc degeneration, lumbar region: Secondary | ICD-10-CM | POA: Diagnosis not present

## 2020-08-08 DIAGNOSIS — I110 Hypertensive heart disease with heart failure: Secondary | ICD-10-CM | POA: Diagnosis not present

## 2020-08-08 DIAGNOSIS — I509 Heart failure, unspecified: Secondary | ICD-10-CM | POA: Diagnosis not present

## 2020-08-13 DIAGNOSIS — M5126 Other intervertebral disc displacement, lumbar region: Secondary | ICD-10-CM | POA: Diagnosis not present

## 2020-08-13 DIAGNOSIS — I4891 Unspecified atrial fibrillation: Secondary | ICD-10-CM | POA: Diagnosis not present

## 2020-08-13 DIAGNOSIS — M5136 Other intervertebral disc degeneration, lumbar region: Secondary | ICD-10-CM | POA: Diagnosis not present

## 2020-08-13 DIAGNOSIS — I110 Hypertensive heart disease with heart failure: Secondary | ICD-10-CM | POA: Diagnosis not present

## 2020-08-13 DIAGNOSIS — G8929 Other chronic pain: Secondary | ICD-10-CM | POA: Diagnosis not present

## 2020-08-13 DIAGNOSIS — I509 Heart failure, unspecified: Secondary | ICD-10-CM | POA: Diagnosis not present

## 2020-08-15 DIAGNOSIS — G8929 Other chronic pain: Secondary | ICD-10-CM | POA: Diagnosis not present

## 2020-08-15 DIAGNOSIS — I110 Hypertensive heart disease with heart failure: Secondary | ICD-10-CM | POA: Diagnosis not present

## 2020-08-15 DIAGNOSIS — M5136 Other intervertebral disc degeneration, lumbar region: Secondary | ICD-10-CM | POA: Diagnosis not present

## 2020-08-15 DIAGNOSIS — M5126 Other intervertebral disc displacement, lumbar region: Secondary | ICD-10-CM | POA: Diagnosis not present

## 2020-08-15 DIAGNOSIS — I4891 Unspecified atrial fibrillation: Secondary | ICD-10-CM | POA: Diagnosis not present

## 2020-08-15 DIAGNOSIS — I509 Heart failure, unspecified: Secondary | ICD-10-CM | POA: Diagnosis not present

## 2020-08-19 DIAGNOSIS — I110 Hypertensive heart disease with heart failure: Secondary | ICD-10-CM | POA: Diagnosis not present

## 2020-08-19 DIAGNOSIS — Z950 Presence of cardiac pacemaker: Secondary | ICD-10-CM | POA: Diagnosis not present

## 2020-08-19 DIAGNOSIS — G8929 Other chronic pain: Secondary | ICD-10-CM | POA: Diagnosis not present

## 2020-08-19 DIAGNOSIS — Z7901 Long term (current) use of anticoagulants: Secondary | ICD-10-CM | POA: Diagnosis not present

## 2020-08-19 DIAGNOSIS — I509 Heart failure, unspecified: Secondary | ICD-10-CM | POA: Diagnosis not present

## 2020-08-19 DIAGNOSIS — M5136 Other intervertebral disc degeneration, lumbar region: Secondary | ICD-10-CM | POA: Diagnosis not present

## 2020-08-19 DIAGNOSIS — E039 Hypothyroidism, unspecified: Secondary | ICD-10-CM | POA: Diagnosis not present

## 2020-08-19 DIAGNOSIS — I4891 Unspecified atrial fibrillation: Secondary | ICD-10-CM | POA: Diagnosis not present

## 2020-08-19 DIAGNOSIS — M5126 Other intervertebral disc displacement, lumbar region: Secondary | ICD-10-CM | POA: Diagnosis not present

## 2020-08-19 DIAGNOSIS — M4120 Other idiopathic scoliosis, site unspecified: Secondary | ICD-10-CM | POA: Diagnosis not present

## 2020-08-19 DIAGNOSIS — M25561 Pain in right knee: Secondary | ICD-10-CM | POA: Diagnosis not present

## 2020-08-20 DIAGNOSIS — I4891 Unspecified atrial fibrillation: Secondary | ICD-10-CM | POA: Diagnosis not present

## 2020-08-20 DIAGNOSIS — I110 Hypertensive heart disease with heart failure: Secondary | ICD-10-CM | POA: Diagnosis not present

## 2020-08-20 DIAGNOSIS — I509 Heart failure, unspecified: Secondary | ICD-10-CM | POA: Diagnosis not present

## 2020-08-20 DIAGNOSIS — G8929 Other chronic pain: Secondary | ICD-10-CM | POA: Diagnosis not present

## 2020-08-20 DIAGNOSIS — M5136 Other intervertebral disc degeneration, lumbar region: Secondary | ICD-10-CM | POA: Diagnosis not present

## 2020-08-20 DIAGNOSIS — M5126 Other intervertebral disc displacement, lumbar region: Secondary | ICD-10-CM | POA: Diagnosis not present

## 2020-08-27 ENCOUNTER — Other Ambulatory Visit: Payer: Self-pay

## 2020-08-27 ENCOUNTER — Ambulatory Visit (INDEPENDENT_AMBULATORY_CARE_PROVIDER_SITE_OTHER): Payer: Medicare Other | Admitting: *Deleted

## 2020-08-27 DIAGNOSIS — Z7901 Long term (current) use of anticoagulants: Secondary | ICD-10-CM | POA: Diagnosis not present

## 2020-08-27 LAB — POCT INR: INR: 3 (ref 2.0–3.0)

## 2020-08-27 NOTE — Patient Instructions (Signed)
Description   Continue taking 1 tablet daily except 1/2 tablet on Sundays, Tuesdays, and Thursdays. Remain consistent with leafy veggies. Recheck in 6 weeks.  Coumadin Clinic [phone]

## 2020-08-28 DIAGNOSIS — G8929 Other chronic pain: Secondary | ICD-10-CM | POA: Diagnosis not present

## 2020-08-28 DIAGNOSIS — M4120 Other idiopathic scoliosis, site unspecified: Secondary | ICD-10-CM | POA: Diagnosis not present

## 2020-08-28 DIAGNOSIS — M545 Low back pain, unspecified: Secondary | ICD-10-CM | POA: Diagnosis not present

## 2020-08-29 ENCOUNTER — Other Ambulatory Visit: Payer: Self-pay | Admitting: Family Medicine

## 2020-08-29 DIAGNOSIS — I4891 Unspecified atrial fibrillation: Secondary | ICD-10-CM | POA: Diagnosis not present

## 2020-08-29 DIAGNOSIS — M5136 Other intervertebral disc degeneration, lumbar region: Secondary | ICD-10-CM | POA: Diagnosis not present

## 2020-08-29 DIAGNOSIS — M5126 Other intervertebral disc displacement, lumbar region: Secondary | ICD-10-CM | POA: Diagnosis not present

## 2020-08-29 DIAGNOSIS — G8929 Other chronic pain: Secondary | ICD-10-CM | POA: Diagnosis not present

## 2020-08-29 DIAGNOSIS — R946 Abnormal results of thyroid function studies: Secondary | ICD-10-CM

## 2020-08-29 DIAGNOSIS — I110 Hypertensive heart disease with heart failure: Secondary | ICD-10-CM | POA: Diagnosis not present

## 2020-08-29 DIAGNOSIS — I509 Heart failure, unspecified: Secondary | ICD-10-CM | POA: Diagnosis not present

## 2020-09-04 DIAGNOSIS — D6869 Other thrombophilia: Secondary | ICD-10-CM | POA: Diagnosis not present

## 2020-09-04 DIAGNOSIS — Z0000 Encounter for general adult medical examination without abnormal findings: Secondary | ICD-10-CM | POA: Diagnosis not present

## 2020-09-04 DIAGNOSIS — D7589 Other specified diseases of blood and blood-forming organs: Secondary | ICD-10-CM | POA: Diagnosis not present

## 2020-09-04 DIAGNOSIS — I4891 Unspecified atrial fibrillation: Secondary | ICD-10-CM | POA: Diagnosis not present

## 2020-09-04 DIAGNOSIS — E039 Hypothyroidism, unspecified: Secondary | ICD-10-CM | POA: Diagnosis not present

## 2020-09-04 DIAGNOSIS — E782 Mixed hyperlipidemia: Secondary | ICD-10-CM | POA: Diagnosis not present

## 2020-09-04 DIAGNOSIS — I129 Hypertensive chronic kidney disease with stage 1 through stage 4 chronic kidney disease, or unspecified chronic kidney disease: Secondary | ICD-10-CM | POA: Diagnosis not present

## 2020-09-04 DIAGNOSIS — R7301 Impaired fasting glucose: Secondary | ICD-10-CM | POA: Diagnosis not present

## 2020-09-04 DIAGNOSIS — F3340 Major depressive disorder, recurrent, in remission, unspecified: Secondary | ICD-10-CM | POA: Diagnosis not present

## 2020-09-04 DIAGNOSIS — Z23 Encounter for immunization: Secondary | ICD-10-CM | POA: Diagnosis not present

## 2020-09-04 DIAGNOSIS — N183 Chronic kidney disease, stage 3 unspecified: Secondary | ICD-10-CM | POA: Diagnosis not present

## 2020-09-05 DIAGNOSIS — M5136 Other intervertebral disc degeneration, lumbar region: Secondary | ICD-10-CM | POA: Diagnosis not present

## 2020-09-05 DIAGNOSIS — I509 Heart failure, unspecified: Secondary | ICD-10-CM | POA: Diagnosis not present

## 2020-09-05 DIAGNOSIS — G8929 Other chronic pain: Secondary | ICD-10-CM | POA: Diagnosis not present

## 2020-09-05 DIAGNOSIS — M5126 Other intervertebral disc displacement, lumbar region: Secondary | ICD-10-CM | POA: Diagnosis not present

## 2020-09-05 DIAGNOSIS — I4891 Unspecified atrial fibrillation: Secondary | ICD-10-CM | POA: Diagnosis not present

## 2020-09-05 DIAGNOSIS — I110 Hypertensive heart disease with heart failure: Secondary | ICD-10-CM | POA: Diagnosis not present

## 2020-09-09 DIAGNOSIS — Z961 Presence of intraocular lens: Secondary | ICD-10-CM | POA: Diagnosis not present

## 2020-09-09 DIAGNOSIS — H401412 Capsular glaucoma with pseudoexfoliation of lens, right eye, moderate stage: Secondary | ICD-10-CM | POA: Diagnosis not present

## 2020-09-10 ENCOUNTER — Other Ambulatory Visit: Payer: Self-pay

## 2020-09-10 ENCOUNTER — Ambulatory Visit (INDEPENDENT_AMBULATORY_CARE_PROVIDER_SITE_OTHER): Payer: Medicare Other | Admitting: Cardiology

## 2020-09-10 VITALS — BP 142/88 | HR 70 | Ht 64.0 in | Wt 181.0 lb

## 2020-09-10 DIAGNOSIS — R5383 Other fatigue: Secondary | ICD-10-CM

## 2020-09-10 DIAGNOSIS — I340 Nonrheumatic mitral (valve) insufficiency: Secondary | ICD-10-CM | POA: Diagnosis not present

## 2020-09-10 DIAGNOSIS — I5042 Chronic combined systolic (congestive) and diastolic (congestive) heart failure: Secondary | ICD-10-CM

## 2020-09-10 DIAGNOSIS — N183 Chronic kidney disease, stage 3 unspecified: Secondary | ICD-10-CM

## 2020-09-10 DIAGNOSIS — I493 Ventricular premature depolarization: Secondary | ICD-10-CM | POA: Diagnosis not present

## 2020-09-10 DIAGNOSIS — I4819 Other persistent atrial fibrillation: Secondary | ICD-10-CM

## 2020-09-10 DIAGNOSIS — I10 Essential (primary) hypertension: Secondary | ICD-10-CM | POA: Diagnosis not present

## 2020-09-10 DIAGNOSIS — E7800 Pure hypercholesterolemia, unspecified: Secondary | ICD-10-CM

## 2020-09-10 DIAGNOSIS — I351 Nonrheumatic aortic (valve) insufficiency: Secondary | ICD-10-CM | POA: Diagnosis not present

## 2020-09-10 DIAGNOSIS — I420 Dilated cardiomyopathy: Secondary | ICD-10-CM | POA: Diagnosis not present

## 2020-09-10 DIAGNOSIS — I495 Sick sinus syndrome: Secondary | ICD-10-CM | POA: Diagnosis not present

## 2020-09-10 NOTE — Progress Notes (Signed)
Date:  09/10/2020   ID:  [name], DOB 09/20/1933, MRN [id number]  PCP:  Harlan Stains, MD  Cardiologist:  Fransico Him, MD  Electrophysiologist:  None   Chief Complaint:  CHF  History of Present Illness:    Molly [name] is a 86 y.o. female with a hx of chronic systolic and diastolic CHF (presumed NICM),tachy-brady syndrome s/p PPM implant 2014, persistent atrial fibrillations/p recent AVN ablation,CKD stage III, hyperparathyroidism, pacing induced LBBB s/p [employer]-Pupgradeon 09/27/2019, chronic afib presents for LE edema.  She has long history of atrial fib going back many years and required PPM as above. In 2017 her echo showed reduction in EF to 40-45% with mild AI and mild-moderate MR, so nuclear stress test was pursued which showed no ischemia. Most recent echocardiogram in 05/26/19 showed further decline in EF to 30-35%, normal RVSP, moderate LAE, mild RAE, moderate mitral regurgitation, mild aortic regurgitation.Her afib has been difficult to control and she has been followed by EP and the afib clinic. She failedamiodarone, Tikosyn and sotalol in the past.She wasnot felt to be a good candidate for afib ablation given her severely dilated LA.   She was admitted 05/25/19 with worsening fatigue, dyspnea, and weight gain with mildly elevated BNP and uncontrolled atrial arrhythmias. She was treated with IV Lasix and underwentAV nodeablation 8/3/20and therefore HR is now dictated by her pacemaker. Her Lasix was stopped due to AKI. She has history of hyperkalemia with ACEI, ARB, and spironolactone and blood pressure has otherwise been prohibitive of aggressive med titration.She continued to volume overload despite above tx. Eventually underwentBoston ScientificCRT-Pupgradeon 09/27/2019. She was also evaluated by Pulmonary for cough.   Called 10/01/19 with increased LE edema. Increased lasix. Seen in office 10/06/19  for further evaluation.No  improvement. Had edema on LLE with abdominal tightness and orthopnea and admitted to being compliant with medication and low sodium but had  about 8lb weight gain.Her lasix increased to 40 BID, and K+ 10 meq daily Cr up very slightly.  Seen back 11/02/2019 and weight down 20lbs and she was feeling weak and tired.  LE edema had resolved.  Lasix decreased to 40mg  daily and Kdur adjusted.   Last seen by Dr. [name] 12/2019 and QRS was much improved after BiV upgrade.    She is here today for followup and is doing well from a cardiac standpoint but is having a lot of problems with chronic back pain.  The chronic pain has resulted in some fatigue.   She denies any chest pain or pressure, SOB, DOE, PND, orthopnea, LE edema, dizziness, palpitations or syncope. She is compliant with her meds and is tolerating meds with no SE.    The patient does not have symptoms concerning for COVID-19 infection (fever, chills, cough, or new shortness of breath).    Prior CV studies:   The following studies were reviewed today:  Office visit notes from 10/06/2019 and 11/02/2019, labs  Past Medical History:  Diagnosis Date   Aortic insufficiency    Atrial tachycardia ([place])    Cancer ([place])    Skin cancer- basal.  1 mylenoma   Chronic combined systolic and diastolic CHF (congestive heart failure) (HCC)    CKD (chronic kidney disease), stage III    Complication of anesthesia    DCM (dilated cardiomyopathy) ([place])    EF 40-45% by echo 2017 - nuclear stress test with no ischemia   GERD (gastroesophageal reflux disease)    H/O benign essential tremor    on amiodarone  resolved off amio   H/O cardiac radiofrequency ablation    a. AV node ablation in 05/2019.   H/O hyperkalemia    Resolved off ACE inhibitors   High cholesterol    History of blood transfusion    Hyperkalemia    Hyperparathyroidism (HCC)    Hypertension    Mitral regurgitation    Persistent atrial fibrillation (HCC)    PONV  (postoperative nausea and vomiting)    PVC's (premature ventricular contractions)    Tachycardia-bradycardia syndrome ([place])    s/p PPM 02/2013   Past Surgical History:  Procedure Laterality Date   AMPUTATION Right 11/27/2014   Procedure: RIGHT 2ND AND 3RD TOE AMPUTATION;  Surgeon: Wylene Simmer, MD;  Location: [facility];  Service: Orthopedics;  Laterality: Right;   ATRIAL FIBRILLATION ABLATION N/A 05/29/2019   Procedure: AVN ABLATION;  Surgeon: Evans Lance, MD;  Location: [place] [facility];  Service: Cardiovascular;  Laterality: N/A;   BIV UPGRADE N/A 09/27/2019   Procedure: BIV UPGRADE;  Surgeon: Evans Lance, MD;  Location: [facility] [facility];  Service: Cardiovascular;  Laterality: N/A;   BUNIONECTOMY     Right   CARDIAC CATHETERIZATION  09/25/2000   EF of 50% -- with normal left ventricular size and function   CARDIOVERSION N/A 12/28/2017   Procedure: CARDIOVERSION;  Surgeon: Dorothy Spark, MD;  Location: [facility] ENDOSCOPY;  Service: Cardiovascular;  Laterality: N/A;   CARDIOVERSION N/A 02/25/2018   Procedure: CARDIOVERSION;  Surgeon: Skeet Latch, MD;  Location: [place];  Service: Cardiovascular;  Laterality: N/A;   Colp Bilateral    Cataract   FOOT SURGERY     INSERT / REPLACE / REMOVE PACEMAKER  02/2013   PACEMAKER INSERTION  02/2013   PERMANENT PACEMAKER INSERTION N/A 03/02/2013   Procedure: PERMANENT PACEMAKER INSERTION;  Surgeon: Evans Lance, MD;  Location: [facility] CATH LAB;  Service: Cardiovascular;  Laterality: N/A;   SHOULDER ARTHROSCOPY W/ ROTATOR CUFF REPAIR     SHOULDER SURGERY Right    roto cuff   TOE AMPUTATION Left    2nd and 3rd, bunion under toes.   TONSILLECTOMY     VARICOSE VEIN SURGERY       Current Meds  Medication Sig   acetaminophen (TYLENOL) 500 MG tablet Take 1,000 mg by mouth 2 (two) times daily. For pain    atorvastatin (LIPITOR) 10 MG tablet Take 10 mg by mouth daily.    dorzolamide (TRUSOPT) 2 % ophthalmic solution Place 1 drop into the right eye 2 (two) times daily.   dorzolamide-timolol (COSOPT) 22.3-6.8 MG/ML ophthalmic solution Place 1 drop into both eyes 2 (two) times daily.   furosemide (LASIX) 40 MG tablet Take 1 tablet (40 mg total) by mouth daily.   levothyroxine (SYNTHROID) 50 MCG tablet TAKE 0.5 TABLETS (25 MCG TOTAL) BY MOUTH DAILY.   LORazepam (ATIVAN) 0.5 MG tablet TAKE 1 TABLET (0.5 MG TOTAL) BY MOUTH AT BEDTIME.   Multiple Vitamin (MULTIVITAMIN WITH MINERALS) TABS Take 1 tablet by mouth daily.   omeprazole (PRILOSEC) 20 MG capsule Take 20 mg by mouth every evening.    warfarin (COUMADIN) 5 MG tablet TAKE 1 TABLET ON MON, WED, FRI, AND SAT. THEN TAKE 1/2 TABLET ON SUN, TUES, THURS OR AS DIRECTED     Allergies:   Ace inhibitors, Losartan, Codeine, and Tramadol   Social History   Tobacco Use   Smoking status: Never Smoker   Smokeless tobacco:  Never Used  Vaping Use   Vaping Use: Never used  Substance Use Topics   Alcohol use: No   Drug use: No     Family Hx: The patient's family history includes Breast cancer in her sister; Hypertension in her father and mother.  ROS:   Please see the history of present illness.     All other systems reviewed and are negative.   Labs/Other Tests and Data Reviewed:    Recent Labs: 11/02/2019: Hemoglobin 13.0; Platelets 179 06/05/2020: BUN 23; Creatinine, Ser 1.11; NT-Pro BNP 877; Potassium 5.1; Sodium 138   Recent Lipid Panel Lab Results  Component Value Date/Time   CHOL 156 05/23/2012 05:14 AM   TRIG 86 05/23/2012 05:14 AM   HDL 80 05/23/2012 05:14 AM   CHOLHDL 2.0 05/23/2012 05:14 AM   LDLCALC 59 05/23/2012 05:14 AM    Wt Readings from Last 3 Encounters:  09/10/20 181 lb (82.1 kg)  01/10/20 174 lb (78.9 kg)  12/28/19 176 lb 3.2 oz (79.9 kg)     Objective:    Vital Signs:  BP (!) 142/88    Pulse 70    Ht 5\' 4"  (1.626 m)    Wt 181 lb (82.1 kg)    SpO2 93%    BMI 31.07  kg/m    GEN: Well nourished, well developed in no acute distress HEENT: Normal NECK: No JVD; No carotid bruits LYMPHATICS: No lymphadenopathy CARDIAC:RRR, no murmurs, rubs, gallops RESPIRATORY:  Clear to auscultation without rales, wheezing or rhonchi  ABDOMEN: Soft, non-tender, non-distended MUSCULOSKELETAL:  No edema; No deformity  SKIN: Warm and dry NEUROLOGIC:  Alert and oriented x 3 PSYCHIATRIC:  Normal affect    ASSESSMENT & PLAN:    1.  Chronic combined systolic/diastolic CHF -she denies and SOB and LE edema is minimal -weight up 7lbs from last winter but appears euvolemic on exam today -continue Lasix 40mg  daily -creatinine stable at 1.08 and K+ 4.8 on 09/04/2020 -encouraged compliance with < 2gm Na diet -repeat 2D echo now that she has had BiV upgrade of her pacer (was supposed to be done last year and was not completed)  2.  Aortic insufficiency -mild by echo 04/2019  3.  Mitral regurgitation -moderate by echo 04/2019 -repeat echo to see if improved after BiV upgrade  4.  HTN -BP borderline controlled on exam -she is not on any BP lowering meds  5.  HLD -followed by PCP  6.  Tachy-brady syndrome -s/p AVN ablation with PPM and subsequent BiV upgrade -followed in device  7.  DCM -unclear etiology -EF 30-35% on echo 04/2019 -she is intolerant to ACE I and ARBs and BB have caused severe fatigue in the past -s/p BiV PPM upgrade and CHF sx much improved -repeat 2D echo to see if LVF has improved  8.  PVCs -her palpitations have resolved  9.  Stage 3 CKD -followed by PCP  10.  Persistent atrial fibrillation -now permanent and s/p AVN ablation -PPM dependent now -continue warfarin>>she is contemplating changing to Eliquis -denies any bleeding problems  11.  Persistent chronic fatigue -fatigue significantly improved after biV PPM upgrade  Medication Adjustments/Labs and Tests Ordered: Current medicines are reviewed at length with the patient  today.  Concerns regarding medicines are outlined above.  Tests Ordered: No orders of the defined types were placed in this encounter.  Medication Changes: No orders of the defined types were placed in this encounter.   Disposition:  Follow up in 6 month(s)  Signed, Hong Moring  Radford Pax, MD  09/10/2020 3:34 PM    [place]

## 2020-09-10 NOTE — Patient Instructions (Signed)
Medication Instructions:  Your physician recommends that you continue on your current medications as directed. Please refer to the Current Medication list given to you today.  *If you need a refill on your cardiac medications before your next appointment, please call your pharmacy*   Testing/Procedures: Your physician has requested that you have an echocardiogram. Echocardiography is a painless test that uses sound waves to create images of your heart. It provides your doctor with information about the size and shape of your heart and how well your heart's chambers and valves are working. This procedure takes approximately one hour. There are no restrictions for this procedure.  Follow-Up: At [facility], you and your health needs are our priority.  As part of our continuing mission to provide you with exceptional heart care, we have created designated Provider Care Teams.  These Care Teams include your primary Cardiologist (physician) and Advanced Practice Providers (APPs -  Physician Assistants and Nurse Practitioners) who all work together to provide you with the care you need, when you need it.  Your next appointment:   6 month(s)  The format for your next appointment:   In Person  Provider:   You may see Traci Turner, MD or one of the following Advanced Practice Providers on your designated Care Team:    Dayna Dunn, PA-C  Michele Lenze, PA-C   

## 2020-09-10 NOTE — Addendum Note (Signed)
Addended by: Antonieta Iba on: 09/10/2020 03:44 PM   Modules accepted: Orders

## 2020-09-12 DIAGNOSIS — I110 Hypertensive heart disease with heart failure: Secondary | ICD-10-CM | POA: Diagnosis not present

## 2020-09-12 DIAGNOSIS — I4891 Unspecified atrial fibrillation: Secondary | ICD-10-CM | POA: Diagnosis not present

## 2020-09-12 DIAGNOSIS — M5126 Other intervertebral disc displacement, lumbar region: Secondary | ICD-10-CM | POA: Diagnosis not present

## 2020-09-12 DIAGNOSIS — I509 Heart failure, unspecified: Secondary | ICD-10-CM | POA: Diagnosis not present

## 2020-09-12 DIAGNOSIS — G8929 Other chronic pain: Secondary | ICD-10-CM | POA: Diagnosis not present

## 2020-09-12 DIAGNOSIS — M5136 Other intervertebral disc degeneration, lumbar region: Secondary | ICD-10-CM | POA: Diagnosis not present

## 2020-09-17 DIAGNOSIS — L820 Inflamed seborrheic keratosis: Secondary | ICD-10-CM | POA: Diagnosis not present

## 2020-09-17 DIAGNOSIS — L909 Atrophic disorder of skin, unspecified: Secondary | ICD-10-CM | POA: Diagnosis not present

## 2020-09-17 DIAGNOSIS — L821 Other seborrheic keratosis: Secondary | ICD-10-CM | POA: Diagnosis not present

## 2020-09-17 DIAGNOSIS — R238 Other skin changes: Secondary | ICD-10-CM | POA: Diagnosis not present

## 2020-09-17 DIAGNOSIS — L570 Actinic keratosis: Secondary | ICD-10-CM | POA: Diagnosis not present

## 2020-09-17 DIAGNOSIS — D489 Neoplasm of uncertain behavior, unspecified: Secondary | ICD-10-CM | POA: Diagnosis not present

## 2020-09-17 DIAGNOSIS — L299 Pruritus, unspecified: Secondary | ICD-10-CM | POA: Diagnosis not present

## 2020-09-27 ENCOUNTER — Ambulatory Visit (INDEPENDENT_AMBULATORY_CARE_PROVIDER_SITE_OTHER): Payer: Medicare Other

## 2020-09-27 DIAGNOSIS — I442 Atrioventricular block, complete: Secondary | ICD-10-CM | POA: Diagnosis not present

## 2020-09-28 LAB — CUP PACEART REMOTE DEVICE CHECK
Battery Remaining Longevity: 132 mo
Battery Remaining Percentage: 100 %
Brady Statistic RA Percent Paced: 0 %
Brady Statistic RV Percent Paced: 95 %
Date Time Interrogation Session: 20211203034100
Implantable Lead Implant Date: 20140508
Implantable Lead Implant Date: 20140508
Implantable Lead Implant Date: 20201202
Implantable Lead Location: 753858
Implantable Lead Location: 753859
Implantable Lead Location: 753860
Implantable Lead Model: 4135
Implantable Lead Model: 4136
Implantable Lead Model: 4674
Implantable Lead Serial Number: 29333020
Implantable Lead Serial Number: 29379474
Implantable Lead Serial Number: 851686
Implantable Pulse Generator Implant Date: 20201202
Lead Channel Impedance Value: 649 Ohm
Lead Channel Impedance Value: 744 Ohm
Lead Channel Pacing Threshold Amplitude: 0.6 V
Lead Channel Pacing Threshold Amplitude: 0.9 V
Lead Channel Pacing Threshold Pulse Width: 0.4 ms
Lead Channel Pacing Threshold Pulse Width: 0.4 ms
Lead Channel Setting Pacing Amplitude: 2 V
Lead Channel Setting Pacing Amplitude: 2.5 V
Lead Channel Setting Pacing Pulse Width: 0.4 ms
Lead Channel Setting Pacing Pulse Width: 0.4 ms
Lead Channel Setting Sensing Sensitivity: 2.5 mV
Lead Channel Setting Sensing Sensitivity: 3 mV
Pulse Gen Serial Number: 750525

## 2020-10-01 DIAGNOSIS — M170 Bilateral primary osteoarthritis of knee: Secondary | ICD-10-CM | POA: Diagnosis not present

## 2020-10-08 NOTE — Progress Notes (Signed)
Remote pacemaker transmission.   

## 2020-10-22 ENCOUNTER — Other Ambulatory Visit: Payer: Self-pay

## 2020-10-22 ENCOUNTER — Ambulatory Visit (INDEPENDENT_AMBULATORY_CARE_PROVIDER_SITE_OTHER): Payer: Medicare Other

## 2020-10-22 DIAGNOSIS — Z7901 Long term (current) use of anticoagulants: Secondary | ICD-10-CM | POA: Diagnosis not present

## 2020-10-22 DIAGNOSIS — I4819 Other persistent atrial fibrillation: Secondary | ICD-10-CM | POA: Diagnosis not present

## 2020-10-22 LAB — POCT INR: INR: 3.9 — AB (ref 2.0–3.0)

## 2020-10-22 NOTE — Patient Instructions (Signed)
Description   Skip today's dosage of Warfarin, then start taking 1/2 tablet daily except 1 tablet on Mondays, Wednesdays and Fridays.  Remain consistent with leafy veggies. Recheck in 3 weeks.  Coumadin Clinic [phone] Pt will call back if decides she wants to switch to Eliquis.

## 2020-10-28 DIAGNOSIS — M545 Low back pain, unspecified: Secondary | ICD-10-CM | POA: Diagnosis not present

## 2020-10-28 DIAGNOSIS — G8929 Other chronic pain: Secondary | ICD-10-CM | POA: Diagnosis not present

## 2020-10-28 DIAGNOSIS — M4120 Other idiopathic scoliosis, site unspecified: Secondary | ICD-10-CM | POA: Diagnosis not present

## 2020-10-29 DIAGNOSIS — M1711 Unilateral primary osteoarthritis, right knee: Secondary | ICD-10-CM | POA: Diagnosis not present

## 2020-10-29 DIAGNOSIS — M170 Bilateral primary osteoarthritis of knee: Secondary | ICD-10-CM | POA: Diagnosis not present

## 2020-11-05 DIAGNOSIS — M1712 Unilateral primary osteoarthritis, left knee: Secondary | ICD-10-CM | POA: Diagnosis not present

## 2020-11-05 DIAGNOSIS — M170 Bilateral primary osteoarthritis of knee: Secondary | ICD-10-CM | POA: Diagnosis not present

## 2020-11-12 DIAGNOSIS — M545 Low back pain, unspecified: Secondary | ICD-10-CM | POA: Diagnosis not present

## 2020-11-12 DIAGNOSIS — M170 Bilateral primary osteoarthritis of knee: Secondary | ICD-10-CM | POA: Diagnosis not present

## 2020-11-12 DIAGNOSIS — M4120 Other idiopathic scoliosis, site unspecified: Secondary | ICD-10-CM | POA: Diagnosis not present

## 2020-11-12 DIAGNOSIS — R030 Elevated blood-pressure reading, without diagnosis of hypertension: Secondary | ICD-10-CM | POA: Diagnosis not present

## 2020-11-12 DIAGNOSIS — Z6830 Body mass index (BMI) 30.0-30.9, adult: Secondary | ICD-10-CM | POA: Diagnosis not present

## 2020-11-12 DIAGNOSIS — G588 Other specified mononeuropathies: Secondary | ICD-10-CM | POA: Diagnosis not present

## 2020-11-18 ENCOUNTER — Other Ambulatory Visit: Payer: Self-pay

## 2020-11-18 ENCOUNTER — Ambulatory Visit (INDEPENDENT_AMBULATORY_CARE_PROVIDER_SITE_OTHER): Payer: Medicare HMO | Admitting: *Deleted

## 2020-11-18 DIAGNOSIS — I4819 Other persistent atrial fibrillation: Secondary | ICD-10-CM

## 2020-11-18 DIAGNOSIS — Z7901 Long term (current) use of anticoagulants: Secondary | ICD-10-CM

## 2020-11-18 LAB — POCT INR: INR: 2.4 (ref 2.0–3.0)

## 2020-11-18 MED ORDER — APIXABAN 5 MG PO TABS: 5.0000 mg | ORAL_TABLET | Freq: Two times a day (BID) | ORAL | 5 refills | Status: DC

## 2020-11-18 MED ORDER — APIXABAN 5 MG PO TABS: 5.0000 mg | ORAL_TABLET | Freq: Two times a day (BID) | ORAL | 0 refills | Status: DC

## 2020-11-18 NOTE — Patient Instructions (Addendum)
  Description   Do not take any Warfarin tonight. Tomorrow start taking Eliquis 5mg  twice a day at 9am and 9pm.     Pt was started on Eliquis 5mg  for Afib on 11/18/2020.    Reviewed patients medication list.  Pt is not currently on any combined P-gp and strong CYP3A4 inhibitors/inducers (ketoconazole, traconazole, ritonavir, carbamazepine, phenytoin, rifampin, St. John's wort).  Reviewed labs.  SCr 1.11, Weight-82.1, last seen by Dr. Radford Pax on 09/10/2020.  Dose appropriate based on age, weight, and SCr.   A full discussion of the nature of anticoagulants has been carried out.  A benefit/risk analysis has been presented to the patient, so that they understand the justification for choosing anticoagulation with Eliquis at this time.  The need for compliance is stressed.  Pt is aware to take the medication twice daily.  Side effects of potential bleeding are discussed, including unusual colored urine or stools, coughing up blood or coffee ground emesis, nose bleeds or serious fall or head trauma.  Discussed signs and symptoms of stroke. The patient should avoid any OTC items containing aspirin or ibuprofen.  Avoid alcohol consumption.   Call if any signs of abnormal bleeding.  Discussed financial obligations and resolved any difficulty in obtaining medication.  Pt aware labs will need to be completed at least annually or sooner if needed.

## 2020-11-20 DIAGNOSIS — H401412 Capsular glaucoma with pseudoexfoliation of lens, right eye, moderate stage: Secondary | ICD-10-CM | POA: Diagnosis not present

## 2020-11-21 DIAGNOSIS — G588 Other specified mononeuropathies: Secondary | ICD-10-CM | POA: Diagnosis not present

## 2020-11-21 DIAGNOSIS — M792 Neuralgia and neuritis, unspecified: Secondary | ICD-10-CM | POA: Diagnosis not present

## 2020-11-21 DIAGNOSIS — M5459 Other low back pain: Secondary | ICD-10-CM | POA: Diagnosis not present

## 2020-12-02 ENCOUNTER — Ambulatory Visit: Payer: Medicare HMO | Admitting: Endocrinology

## 2020-12-02 ENCOUNTER — Encounter: Payer: Self-pay | Admitting: Endocrinology

## 2020-12-02 ENCOUNTER — Other Ambulatory Visit: Payer: Self-pay

## 2020-12-02 DIAGNOSIS — E210 Primary hyperparathyroidism: Secondary | ICD-10-CM

## 2020-12-02 NOTE — Patient Instructions (Addendum)
Your blood pressure is high today.  Please see your primary care provider soon, to have it rechecked No treatment is needed for the calcium. I would be happy to see you back here as needed.      Fall Prevention in the Home, Adult Falls can cause injuries and can affect people from all age groups. There are many simple things that you can do to make your home safe and to help prevent falls. Ask for help when making these changes, if needed. What actions can I take to prevent falls? General instructions  Use good lighting in all rooms. Replace any light bulbs that burn out.  Turn on lights if it is dark. Use night-lights.  Place frequently used items in easy-to-reach places. Lower the shelves around your home if necessary.  Set up furniture so that there are clear paths around it. Avoid moving your furniture around.  Remove throw rugs and other tripping hazards from the floor.  Avoid walking on wet floors.  Fix any uneven floor surfaces.  Add color or contrast paint or tape to grab bars and handrails in your home. Place contrasting color strips on the first and last steps of stairways.  When you use a stepladder, make sure that it is completely opened and that the sides are firmly locked. Have someone hold the ladder while you are using it. Do not climb a closed stepladder.  Be aware of any and all pets. What can I do in the bathroom?  Keep the floor dry. Immediately clean up any water that spills onto the floor.  Remove soap buildup in the tub or shower on a regular basis.  Use non-skid mats or decals on the floor of the tub or shower.  Attach bath mats securely with double-sided, non-slip rug tape.  If you need to sit down while you are in the shower, use a plastic, non-slip stool.  Install grab bars by the toilet and in the tub and shower. Do not use towel bars as grab bars.      What can I do in the bedroom?  Make sure that a bedside light is easy to reach.  Do not  use oversized bedding that drapes onto the floor.  Have a firm chair that has side arms to use for getting dressed. What can I do in the kitchen?  Clean up any spills right away.  If you need to reach for something above you, use a sturdy step stool that has a grab bar.  Keep electrical cables out of the way.  Do not use floor polish or wax that makes floors slippery. If you must use wax, make sure that it is non-skid floor wax. What can I do in the stairways?  Do not leave any items on the stairs.  Make sure that you have a light switch at the top of the stairs and the bottom of the stairs. Have them installed if you do not have them.  Make sure that there are handrails on both sides of the stairs. Fix handrails that are broken or loose. Make sure that handrails are as long as the stairways.  Install non-slip stair treads on all stairs in your home.  Avoid having throw rugs at the top or bottom of stairways, or secure the rugs with carpet tape to prevent them from moving.  Choose a carpet design that does not hide the edge of steps on the stairway.  Check any carpeting to make sure that it is  firmly attached to the stairs. Fix any carpet that is loose or worn. What can I do on the outside of my home?  Use bright outdoor lighting.  Regularly repair the edges of walkways and driveways and fix any cracks.  Remove high doorway thresholds.  Trim any shrubbery on the main path into your home.  Regularly check that handrails are securely fastened and in good repair. Both sides of any steps should have handrails.  Install guardrails along the edges of any raised decks or porches.  Clear walkways of debris and clutter, including tools and rocks.  Have leaves, snow, and ice cleared regularly.  Use sand or salt on walkways during winter months.  In the garage, clean up any spills right away, including grease or oil spills. What other actions can I take?  Wear closed-toe shoes  that fit well and support your feet. Wear shoes that have rubber soles or low heels.  Use mobility aids as needed, such as canes, walkers, scooters, and crutches.  Review your medicines with your health care provider. Some medicines can cause dizziness or changes in blood pressure, which increase your risk of falling. Talk with your health care provider about other ways that you can decrease your risk of falls. This may include working with a physical therapist or trainer to improve your strength, balance, and endurance. Where to find more information  Centers for Disease Control and Prevention, [employer]: WebmailGuide.co.za  [employer] on Aging: BrainJudge.co.uk Contact a health care provider if:  You are afraid of falling at home.  You feel weak, drowsy, or dizzy at home.  You fall at home. Summary  There are many simple things that you can do to make your home safe and to help prevent falls.  Ways to make your home safe include removing tripping hazards and installing grab bars in the bathroom.  Ask for help when making these changes in your home. This information is not intended to replace advice given to you by your health care provider. Make sure you discuss any questions you have with your health care provider. Document Revised: 09/24/2017 Document Reviewed: 05/27/2017 Elsevier Patient Education  2021 [employer].

## 2020-12-02 NOTE — Progress Notes (Signed)
Subjective:    Patient ID: Molly [name], female    DOB: [birth year], 87 y.o.   MRN: [id number]  HPI Pt is referred by Dr Dema Severin, for hypercalcemia.  Pt was noted to have hypercalcemia in 2013.  I last saw her then.  elev PTH did not need surgery.  she has never had osteoporosis, urolithiasis, sarcoidosis, cancer, PUD, pancreatitis, or bony fracture.  He does not take vitamin-D or A supplements.  Pt denies taking antacids, Li++, or HCTZ.  Main symptom is chronic back pain.   Past Medical History:  Diagnosis Date  . Aortic insufficiency   . Atrial tachycardia ([place])   . Cancer ([place])    Skin cancer- basal.  1 mylenoma  . Chronic combined systolic and diastolic CHF (congestive heart failure) ([place])   . CKD (chronic kidney disease), stage III ([place])   . Complication of anesthesia   . DCM (dilated cardiomyopathy) ([place])    EF 40-45% by echo 2017 - nuclear stress test with no ischemia  . GERD (gastroesophageal reflux disease)   . H/O benign essential tremor    on amiodarone resolved off amio  . H/O cardiac radiofrequency ablation    a. AV node ablation in 05/2019.  Marland Kitchen H/O hyperkalemia    Resolved off ACE inhibitors  . High cholesterol   . History of blood transfusion   . Hyperkalemia   . Hyperparathyroidism ([place])   . Hypertension   . Mitral regurgitation   . Persistent atrial fibrillation ([place])   . PONV (postoperative nausea and vomiting)   . PVC's (premature ventricular contractions)   . Tachycardia-bradycardia syndrome [facility])    s/p PPM 02/2013    Past Surgical History:  Procedure Laterality Date  . AMPUTATION Right 11/27/2014   Procedure: RIGHT 2ND AND 3RD TOE AMPUTATION;  Surgeon: Wylene Simmer, MD;  Location: [place];  Service: Orthopedics;  Laterality: Right;  . ATRIAL FIBRILLATION ABLATION N/A 05/29/2019   Procedure: AVN ABLATION;  Surgeon: Evans Lance, MD;  Location: [place] CV LAB;  Service: Cardiovascular;  Laterality: N/A;  . BIV UPGRADE N/A 09/27/2019   Procedure: BIV  UPGRADE;  Surgeon: Evans Lance, MD;  Location: [place] CV LAB;  Service: Cardiovascular;  Laterality: N/A;  . BUNIONECTOMY     Right  . CARDIAC CATHETERIZATION  09/25/2000   EF of 50% -- with normal left ventricular size and function  . CARDIOVERSION N/A 12/28/2017   Procedure: CARDIOVERSION;  Surgeon: Dorothy Spark, MD;  Location: [facility] ENDOSCOPY;  Service: Cardiovascular;  Laterality: N/A;  . CARDIOVERSION N/A 02/25/2018   Procedure: CARDIOVERSION;  Surgeon: Skeet Latch, MD;  Location: [place];  Service: Cardiovascular;  Laterality: N/A;  . CESAREAN SECTION    . CESAREAN SECTION    . EYE SURGERY Bilateral    Cataract  . FOOT SURGERY    . INSERT / REPLACE / REMOVE PACEMAKER  02/2013  . PACEMAKER INSERTION  02/2013  . PERMANENT PACEMAKER INSERTION N/A 03/02/2013   Procedure: PERMANENT PACEMAKER INSERTION;  Surgeon: Evans Lance, MD;  Location: [facility] CATH LAB;  Service: Cardiovascular;  Laterality: N/A;  . SHOULDER ARTHROSCOPY W/ ROTATOR CUFF REPAIR    . SHOULDER SURGERY Right    roto cuff  . TOE AMPUTATION Left    2nd and 3rd, bunion under toes.  . TONSILLECTOMY    . VARICOSE VEIN SURGERY      Social History   Socioeconomic History  . Marital status: Married    Spouse name: Not on file  .  Number of children: 3  . Years of education: Not on file  . Highest education level: Not on file  Occupational History  . Not on file  Tobacco Use  . Smoking status: Never Smoker  . Smokeless tobacco: Never Used  Vaping Use  . Vaping Use: Never used  Substance and Sexual Activity  . Alcohol use: No  . Drug use: No  . Sexual activity: Not Currently  Other Topics Concern  . Not on file  Social History Narrative  . Not on file   Social Determinants of Health   Financial Resource Strain: Not on file  Food Insecurity: Not on file  Transportation Needs: Not on file  Physical Activity: Not on file  Stress: Not on file  Social Connections: Not on file  Intimate Partner  Violence: Not on file    Current Outpatient Medications on File Prior to Visit  Medication Sig Dispense Refill  . acetaminophen (TYLENOL) 500 MG tablet Take 1,000 mg by mouth 2 (two) times daily. For pain    . apixaban (ELIQUIS) 5 MG TABS tablet Take 1 tablet (5 mg total) by mouth 2 (two) times daily. 60 tablet 0  . apixaban (ELIQUIS) 5 MG TABS tablet Take 1 tablet (5 mg total) by mouth 2 (two) times daily. 60 tablet 5  . atorvastatin (LIPITOR) 10 MG tablet Take 10 mg by mouth daily.    . dorzolamide (TRUSOPT) 2 % ophthalmic solution Place 1 drop into the right eye 2 (two) times daily.    . dorzolamide-timolol (COSOPT) 22.3-6.8 MG/ML ophthalmic solution Place 1 drop into both eyes 2 (two) times daily.    . furosemide (LASIX) 40 MG tablet Take 1 tablet (40 mg total) by mouth daily. 90 tablet 3  . levothyroxine (SYNTHROID) 50 MCG tablet TAKE 0.5 TABLETS (25 MCG TOTAL) BY MOUTH DAILY. 45 tablet 1  . LORazepam (ATIVAN) 0.5 MG tablet TAKE 1 TABLET (0.5 MG TOTAL) BY MOUTH AT BEDTIME. 30 tablet 2  . Multiple Vitamin (MULTIVITAMIN WITH MINERALS) TABS Take 1 tablet by mouth daily.    Marland Kitchen omeprazole (PRILOSEC) 20 MG capsule Take 20 mg by mouth every evening.      No current facility-administered medications on file prior to visit.    Allergies  Allergen Reactions  . Ace Inhibitors     hyperkalemia  . Losartan     hyperkalemia  . Codeine Nausea Only  . Tramadol Nausea Only    Family History  Problem Relation Age of Onset  . Hypertension Mother   . Hypertension Father   . Breast cancer Sister   . Hypercalcemia Neg Hx     BP (!) 180/90 (BP Location: Right Arm, Patient Position: Sitting, Cuff Size: Normal)   Pulse 69   Ht 5\' 5"  (1.651 m)   Wt 184 lb (83.5 kg)   SpO2 95%   BMI 30.62 kg/m     Review of Systems Denies weight loss, hematuria, depression, numbness.      Objective:   Physical Exam VITAL SIGNS:  See vs page GENERAL: no distress NECK: There is no palpable thyroid  enlargement.  No thyroid nodule is palpable.  No palpable lymphadenopathy at the anterior neck. SPINE: no kyphosis Ext: no leg edema GAIT: steady, with a walker.    Lab Results  Component Value Date   ALT 16 09/06/2019   AST 25 09/06/2019   ALKPHOS 74 09/06/2019   BILITOT 0.5 09/06/2019    Lab Results  Component Value Date   PTH 68 (  H) 08/15/2019   CALCIUM 10.5 (H) 06/05/2020   CAION 5.9 (H) 04/26/2019   PHOS 3.2 02/23/2017   DEXA (2009): osteopenia  I have reviewed outside records, and summarized: Pt was noted to have elevated Ca++, and referred here. OA, wellness, dyslipidemia, and depression were also addressed     Assessment & Plan:  Hypercalcemia, prob due to hyperparathyroidism.  If Ca++ goes over 12, she should consider cinacalcet.  I would be happy to rx this if necessary. she declines to recheck DEXA. I also offered to check 1, 25-OH Vit-D, PTHrP, and SPEP, to look for other causes of hypercalcemia.  She declines.     Patient Instructions   Your blood pressure is high today.  Please see your primary care provider soon, to have it rechecked No treatment is needed for the calcium. I would be happy to see you back here as needed.      Fall Prevention in the Home, Adult Falls can cause injuries and can affect people from all age groups. There are many simple things that you can do to make your home safe and to help prevent falls. Ask for help when making these changes, if needed. What actions can I take to prevent falls? General instructions  Use good lighting in all rooms. Replace any light bulbs that burn out.  Turn on lights if it is dark. Use night-lights.  Place frequently used items in easy-to-reach places. Lower the shelves around your home if necessary.  Set up furniture so that there are clear paths around it. Avoid moving your furniture around.  Remove throw rugs and other tripping hazards from the floor.  Avoid walking on wet floors.  Fix any  uneven floor surfaces.  Add color or contrast paint or tape to grab bars and handrails in your home. Place contrasting color strips on the first and last steps of stairways.  When you use a stepladder, make sure that it is completely opened and that the sides are firmly locked. Have someone hold the ladder while you are using it. Do not climb a closed stepladder.  Be aware of any and all pets. What can I do in the bathroom?  Keep the floor dry. Immediately clean up any water that spills onto the floor.  Remove soap buildup in the tub or shower on a regular basis.  Use non-skid mats or decals on the floor of the tub or shower.  Attach bath mats securely with double-sided, non-slip rug tape.  If you need to sit down while you are in the shower, use a plastic, non-slip stool.  Install grab bars by the toilet and in the tub and shower. Do not use towel bars as grab bars.      What can I do in the bedroom?  Make sure that a bedside light is easy to reach.  Do not use oversized bedding that drapes onto the floor.  Have a firm chair that has side arms to use for getting dressed. What can I do in the kitchen?  Clean up any spills right away.  If you need to reach for something above you, use a sturdy step stool that has a grab bar.  Keep electrical cables out of the way.  Do not use floor polish or wax that makes floors slippery. If you must use wax, make sure that it is non-skid floor wax. What can I do in the stairways?  Do not leave any items on the stairs.  Make sure that  you have a light switch at the top of the stairs and the bottom of the stairs. Have them installed if you do not have them.  Make sure that there are handrails on both sides of the stairs. Fix handrails that are broken or loose. Make sure that handrails are as long as the stairways.  Install non-slip stair treads on all stairs in your home.  Avoid having throw rugs at the top or bottom of stairways, or  secure the rugs with carpet tape to prevent them from moving.  Choose a carpet design that does not hide the edge of steps on the stairway.  Check any carpeting to make sure that it is firmly attached to the stairs. Fix any carpet that is loose or worn. What can I do on the outside of my home?  Use bright outdoor lighting.  Regularly repair the edges of walkways and driveways and fix any cracks.  Remove high doorway thresholds.  Trim any shrubbery on the main path into your home.  Regularly check that handrails are securely fastened and in good repair. Both sides of any steps should have handrails.  Install guardrails along the edges of any raised decks or porches.  Clear walkways of debris and clutter, including tools and rocks.  Have leaves, snow, and ice cleared regularly.  Use sand or salt on walkways during winter months.  In the garage, clean up any spills right away, including grease or oil spills. What other actions can I take?  Wear closed-toe shoes that fit well and support your feet. Wear shoes that have rubber soles or low heels.  Use mobility aids as needed, such as canes, walkers, scooters, and crutches.  Review your medicines with your health care provider. Some medicines can cause dizziness or changes in blood pressure, which increase your risk of falling. Talk with your health care provider about other ways that you can decrease your risk of falls. This may include working with a physical therapist or trainer to improve your strength, balance, and endurance. Where to find more information  Centers for Disease Control and Prevention, STEADI: WebmailGuide.co.za  [employer] on Aging: BrainJudge.co.uk Contact a health care provider if:  You are afraid of falling at home.  You feel weak, drowsy, or dizzy at home.  You fall at home. Summary  There are many simple things that you can do to make your home safe and to help prevent  falls.  Ways to make your home safe include removing tripping hazards and installing grab bars in the bathroom.  Ask for help when making these changes in your home. This information is not intended to replace advice given to you by your health care provider. Make sure you discuss any questions you have with your health care provider. Document Revised: 09/24/2017 Document Reviewed: 05/27/2017 Elsevier Patient Education  2021 [employer].

## 2020-12-03 ENCOUNTER — Other Ambulatory Visit (HOSPITAL_COMMUNITY): Payer: Medicare Other

## 2020-12-04 ENCOUNTER — Telehealth: Payer: Self-pay | Admitting: Cardiology

## 2020-12-04 DIAGNOSIS — E210 Primary hyperparathyroidism: Secondary | ICD-10-CM | POA: Insufficient documentation

## 2020-12-04 NOTE — Telephone Encounter (Signed)
Please get her to come in for optivol reading on her pacer

## 2020-12-04 NOTE — Telephone Encounter (Signed)
BP is likely being driven by her pain.  I would like her to see her PCP

## 2020-12-04 NOTE — Telephone Encounter (Signed)
Spoke with the patient who states that her BP has been running high. Readings from the past two days include 180/90, 137/105 - 68, 145/100 - 72, 159/95 - 53, 147/104 - 69.  She states that she was also concerned about her heart rate because it has gone done into the 50s a couple times and she has a pacemaker so it is typically upper 60s.  She denies any increase in salt in her diet. She does state that she has been in a lot of pain recently. She has back and knee problems which she got injections for about two weeks ago. She states that they did not help and she is following up with pain management on Monday  She states that she also recently switched from Warfarin to Eliquis.  Patient states that this morning she woke up a lot later than usual and does not feel good. She complains of fatigue, dizziness, and a headache. She does not feel like she is going to pass out. She was not running a fever on Monday but does not have a way to take her temperature currently. She denies and chest pain. Does have some shortness of breath.  She is only taking lasix 20 mg daily because she states 40 mg was making her urinate too much.  Advised patient to rest,  make sure she is hydrated and change positions slowly.  She is going to send in a pacemaker reading.  Will forward to device to review.

## 2020-12-04 NOTE — Telephone Encounter (Signed)
Pt c/o BP issue: STAT if pt c/o blurred vision, one-sided weakness or slurred speech  1. What are your last 5 BP readings? 159/95 HR 53 today, 180/90 HR 69 Monday  2. Are you having any other symptoms (ex. Dizziness, headache, blurred vision, passed out)? Dizzy now   3. What is your BP issue? Patient states her BP has been going up and her HR has been going down. She states she feels dizzy, but does not feel faint. She states she has not fainted. She states she is not having any other symptoms.

## 2020-12-04 NOTE — Telephone Encounter (Signed)
Can we have her come in to get an Optivol reading

## 2020-12-04 NOTE — Telephone Encounter (Signed)
Remote transmission reviewed. Device function WNL, no ventricular episodes recorded, programmed VVIR, BIVP % 95 %.

## 2020-12-05 ENCOUNTER — Other Ambulatory Visit: Payer: Self-pay | Admitting: Cardiology

## 2020-12-05 DIAGNOSIS — Z7901 Long term (current) use of anticoagulants: Secondary | ICD-10-CM

## 2020-12-05 NOTE — Telephone Encounter (Signed)
Pt discontinued Warfarin at [facility] on 11/18/20, and switched to Eliquis. Will refuse Warfarin refill.

## 2020-12-05 NOTE — Telephone Encounter (Signed)
Her device does not have a feature within it that can measure fluid balance.

## 2020-12-06 ENCOUNTER — Telehealth: Payer: Self-pay | Admitting: Internal Medicine

## 2020-12-06 DIAGNOSIS — I10 Essential (primary) hypertension: Secondary | ICD-10-CM

## 2020-12-06 NOTE — Telephone Encounter (Signed)
Patient calling back to follow up on her call from 2/9 Patient reports being in severe back pain, she has gotten multiple shots in her legs and back with no relief.   Informed the patient that per device clinic, her Optivol reading can not be checked on her device which is what Dr. Radford Pax was inquiring about.   Patient denies CP, SOB, N/V, fever. Patient reports feeling palpitations occasionally when lying down at night.   Recent readings: 119/87 HR 74  125/59 HR 78 104/76 HR 69  Patient is concerned about her HR fluctuating.  Assured patient that BP/HR readings were within normal limits.   Encouraged patient to continue monitoring her BP/HR at home and focus on staying hydrated.   She is already seeing a Chemical engineer and has been referred to another doctor for her pain management who she will see next week.   Patient would like to know if her pacer needs to be adjusted or if she needs to see Dr. [name] in the near future.

## 2020-12-06 NOTE — Telephone Encounter (Signed)
° ° °  STAT if HR is under 50 or over 120 (normal HR is 60-100 beats per minute)  1) What is your heart rate? 119/87 74    2) Do you have a log of your heart rate readings (document readings)?   3) Do you have any other symptoms? Pt said her BP and HR continue to fluctuate her HR ranging from 50-60 and in one time her HR went down to 44. She said she tried sending message to Dr. Radford Pax but didn't get a callback so she wanted to send message to Dr. [name]. She said she's been feeling weak and feels like there's something going on in her heart.

## 2020-12-06 NOTE — Telephone Encounter (Signed)
Come in for BNP

## 2020-12-06 NOTE — Telephone Encounter (Signed)
Will defer pacer changes to Dr. [name].  Needs to see PCP for her pain complaints.  See other note - have her come in for BNP

## 2020-12-09 DIAGNOSIS — M4120 Other idiopathic scoliosis, site unspecified: Secondary | ICD-10-CM | POA: Diagnosis not present

## 2020-12-09 DIAGNOSIS — G588 Other specified mononeuropathies: Secondary | ICD-10-CM | POA: Diagnosis not present

## 2020-12-10 NOTE — Telephone Encounter (Signed)
See above note

## 2020-12-10 NOTE — Telephone Encounter (Signed)
-  follow up with PCP

## 2020-12-10 NOTE — Telephone Encounter (Signed)
Spoke with the patient who states that she is still not feeling well.  She states that yesterday her HR was up to 88 and her pacemaker is set at 70. She complains of fatigue and SOB. She states that she did go to the pain management clinic yesterday.  She would like to have lab work done at her PCP - orders have been faxed.  Will forward to Dr. Tanna Furry nurse to follow up in regards to pacemaker.

## 2020-12-11 ENCOUNTER — Other Ambulatory Visit: Payer: Self-pay | Admitting: *Deleted

## 2020-12-11 ENCOUNTER — Other Ambulatory Visit: Payer: Self-pay

## 2020-12-11 ENCOUNTER — Other Ambulatory Visit: Payer: Medicare HMO

## 2020-12-11 ENCOUNTER — Telehealth: Payer: Self-pay

## 2020-12-11 ENCOUNTER — Other Ambulatory Visit: Payer: Self-pay | Admitting: Cardiology

## 2020-12-11 DIAGNOSIS — I10 Essential (primary) hypertension: Secondary | ICD-10-CM

## 2020-12-11 MED ORDER — APIXABAN 5 MG PO TABS: 5.0000 mg | ORAL_TABLET | Freq: Two times a day (BID) | ORAL | 5 refills | Status: DC

## 2020-12-11 NOTE — Addendum Note (Signed)
Addended by: Lutricia Feil on: 12/11/2020 08:43 AM   Modules accepted: Orders

## 2020-12-11 NOTE — Telephone Encounter (Addendum)
Prescription refill received for Eliquis. Refused refill request because refill was sent in on 11/18/2020. One month supply with 5 refills.  Eliquis showing up twice on pt's med list, d/c one.   Will resend in refill for Eliquis to make sure pt has it on file.   Hx: afib LOV: [place], 09/10/2020 Scr: 1.08, 09/04/2020 Weight: 83.5 kg  Age: 87 yo   Prescription refill sent.

## 2020-12-11 NOTE — Telephone Encounter (Signed)
The patient wanted her device interrogated while she was here. I told her we do not have any doctors in the office but she can send a transmission with her home monitor and the nurse can review it.

## 2020-12-12 ENCOUNTER — Telehealth: Payer: Self-pay | Admitting: Cardiology

## 2020-12-12 DIAGNOSIS — E210 Primary hyperparathyroidism: Secondary | ICD-10-CM | POA: Diagnosis not present

## 2020-12-12 DIAGNOSIS — L299 Pruritus, unspecified: Secondary | ICD-10-CM | POA: Diagnosis not present

## 2020-12-12 DIAGNOSIS — I10 Essential (primary) hypertension: Secondary | ICD-10-CM

## 2020-12-12 DIAGNOSIS — M5136 Other intervertebral disc degeneration, lumbar region: Secondary | ICD-10-CM | POA: Diagnosis not present

## 2020-12-12 DIAGNOSIS — R69 Illness, unspecified: Secondary | ICD-10-CM | POA: Diagnosis not present

## 2020-12-12 DIAGNOSIS — I4891 Unspecified atrial fibrillation: Secondary | ICD-10-CM | POA: Diagnosis not present

## 2020-12-12 DIAGNOSIS — D6869 Other thrombophilia: Secondary | ICD-10-CM | POA: Diagnosis not present

## 2020-12-12 LAB — PRO B NATRIURETIC PEPTIDE: NT-Pro BNP: 844 pg/mL — ABNORMAL HIGH (ref 0–738)

## 2020-12-12 NOTE — Telephone Encounter (Signed)
Molly Margarita, MD  12/12/2020 9:15 AM EST      BNP is elevated - increase Lasix to 40mg  BID for 2 days and then she needs to go back on lasix 40mg  daily. BMET in 1 week   Patient had lab work done here yesterday and does not need any lab work done today at Dr. Orest Dikes office.   The patient has been notified of the result and verbalized understanding.  All questions (if any) were answered. Antonieta Iba, RN 12/12/2020 9:26 AM

## 2020-12-12 NOTE — Telephone Encounter (Signed)
Molly [name] is calling requesting the lab orders Dr. Radford Pax is needing be faxed over to their office so they can draw them today. Their fax # is: [phone]. Please advise.

## 2020-12-19 ENCOUNTER — Other Ambulatory Visit: Payer: Medicare HMO

## 2020-12-23 ENCOUNTER — Other Ambulatory Visit: Payer: Medicare HMO | Admitting: *Deleted

## 2020-12-23 ENCOUNTER — Other Ambulatory Visit: Payer: Self-pay

## 2020-12-23 DIAGNOSIS — I10 Essential (primary) hypertension: Secondary | ICD-10-CM | POA: Diagnosis not present

## 2020-12-24 ENCOUNTER — Other Ambulatory Visit: Payer: Medicare HMO

## 2020-12-24 DIAGNOSIS — R238 Other skin changes: Secondary | ICD-10-CM | POA: Diagnosis not present

## 2020-12-24 DIAGNOSIS — L299 Pruritus, unspecified: Secondary | ICD-10-CM | POA: Diagnosis not present

## 2020-12-24 DIAGNOSIS — C44321 Squamous cell carcinoma of skin of nose: Secondary | ICD-10-CM | POA: Diagnosis not present

## 2020-12-24 DIAGNOSIS — D489 Neoplasm of uncertain behavior, unspecified: Secondary | ICD-10-CM | POA: Diagnosis not present

## 2020-12-24 LAB — BASIC METABOLIC PANEL
BUN/Creatinine Ratio: 26 (ref 12–28)
BUN: 29 mg/dL — ABNORMAL HIGH (ref 8–27)
CO2: 24 mmol/L (ref 20–29)
Calcium: 10.6 mg/dL — ABNORMAL HIGH (ref 8.7–10.3)
Chloride: 101 mmol/L (ref 96–106)
Creatinine, Ser: 1.11 mg/dL — ABNORMAL HIGH (ref 0.57–1.00)
Glucose: 105 mg/dL — ABNORMAL HIGH (ref 65–99)
Potassium: 4.4 mmol/L (ref 3.5–5.2)
Sodium: 140 mmol/L (ref 134–144)
eGFR: 49 mL/min/{1.73_m2} — ABNORMAL LOW (ref >59)

## 2020-12-27 ENCOUNTER — Ambulatory Visit (INDEPENDENT_AMBULATORY_CARE_PROVIDER_SITE_OTHER): Payer: Medicare HMO

## 2020-12-27 DIAGNOSIS — I442 Atrioventricular block, complete: Secondary | ICD-10-CM | POA: Diagnosis not present

## 2020-12-30 LAB — CUP PACEART REMOTE DEVICE CHECK
Battery Remaining Longevity: 132 mo
Battery Remaining Percentage: 100 %
Brady Statistic RA Percent Paced: 0 %
Brady Statistic RV Percent Paced: 94 %
Date Time Interrogation Session: 20220304034100
Implantable Lead Implant Date: 20140508
Implantable Lead Implant Date: 20140508
Implantable Lead Implant Date: 20201202
Implantable Lead Location: 753858
Implantable Lead Location: 753859
Implantable Lead Location: 753860
Implantable Lead Model: 4135
Implantable Lead Model: 4136
Implantable Lead Model: 4674
Implantable Lead Serial Number: 29333020
Implantable Lead Serial Number: 29379474
Implantable Lead Serial Number: 851686
Implantable Pulse Generator Implant Date: 20201202
Lead Channel Impedance Value: 677 Ohm
Lead Channel Impedance Value: 758 Ohm
Lead Channel Pacing Threshold Amplitude: 0.6 V
Lead Channel Pacing Threshold Amplitude: 0.7 V
Lead Channel Pacing Threshold Pulse Width: 0.4 ms
Lead Channel Pacing Threshold Pulse Width: 0.4 ms
Lead Channel Setting Pacing Amplitude: 2 V
Lead Channel Setting Pacing Amplitude: 2.5 V
Lead Channel Setting Pacing Pulse Width: 0.4 ms
Lead Channel Setting Pacing Pulse Width: 0.4 ms
Lead Channel Setting Sensing Sensitivity: 2.5 mV
Lead Channel Setting Sensing Sensitivity: 3 mV
Pulse Gen Serial Number: 750525

## 2020-12-30 NOTE — Progress Notes (Signed)
Cardiology Office Note Date:  12/30/2020  Patient ID:  Aadhira, Heffernan 05/09/1933, MRN [id number] PCP:  Harlan Stains, MD  Cardiologist:  Dr. Radford Pax Electrophysiologist: Dr. [name]    Chief Complaint:  annual device visit  History of Present Illness: LEONI GOODNESS is a 87 y.o. female with history of AFib, tachy-brady w/PPM > NICM > LBBB, AVNode ablation CRT upgrade, chronic combined CHF, CKD (III)  She comes in today to be seen for Dr. [name], last seen by him march 2021 discussed good EKG response to CRT pacing with significant improvement in QRS duration, no changes were made.  More recently Nov 2021, saw Dr. Radford Pax, noted that BP and tendency towards hyperkalemia and CKD, limited ability to titrate her medicines.  Intermittently required pulsing of her lasix for edema, SOB.  Phone notes last month  via Dr. [name], pt not feeling well, reported HR 88, worried, pacer set at 70, fatigued and SOB, recommended PMD follow up Subsequently Telephone notes last month with elevated BNP and adjustments made to her lasix for 2 days, planned for labs via Dr. Radford Pax   TODAY She is breathing easier, reports brisk uptick in urine output with the couple days of extra lasix. No CP, palpitations. Generally fatigued, tired. No near syncope or syncope. No bleeding or signs of bleeding Happy with her switch to eliquis.  Mentions her home BP cuff for a week or so reported her HR in the 40's-50's, this was the week she was feeling poorly  Device information BSCi dual chamber PPM implanted 2014 >> upgrade to CRT-P 09/27/2019 Programmed VVIR (RV/LV) AV node ablation   Past Medical History:  Diagnosis Date  . Aortic insufficiency   . Atrial tachycardia ([place])   . Cancer ([place])    Skin cancer- basal.  1 mylenoma  . Chronic combined systolic and diastolic CHF (congestive heart failure) ([place])   . CKD (chronic kidney disease), stage III ([place])   . Complication of anesthesia   . DCM  (dilated cardiomyopathy) ([place])    EF 40-45% by echo 2017 - nuclear stress test with no ischemia  . GERD (gastroesophageal reflux disease)   . H/O benign essential tremor    on amiodarone resolved off amio  . H/O cardiac radiofrequency ablation    a. AV node ablation in 05/2019.  Marland Kitchen H/O hyperkalemia    Resolved off ACE inhibitors  . High cholesterol   . History of blood transfusion   . Hyperkalemia   . Hyperparathyroidism ([place])   . Hypertension   . Mitral regurgitation   . Persistent atrial fibrillation ([place])   . PONV (postoperative nausea and vomiting)   . PVC's (premature ventricular contractions)   . Tachycardia-bradycardia syndrome [facility])    s/p PPM 02/2013    Past Surgical History:  Procedure Laterality Date  . AMPUTATION Right 11/27/2014   Procedure: RIGHT 2ND AND 3RD TOE AMPUTATION;  Surgeon: Wylene Simmer, MD;  Location: [facility];  Service: Orthopedics;  Laterality: Right;  . ATRIAL FIBRILLATION ABLATION N/A 05/29/2019   Procedure: AVN ABLATION;  Surgeon: Evans Lance, MD;  Location: [place] CV LAB;  Service: Cardiovascular;  Laterality: N/A;  . BIV UPGRADE N/A 09/27/2019   Procedure: BIV UPGRADE;  Surgeon: Evans Lance, MD;  Location: [place] CV LAB;  Service: Cardiovascular;  Laterality: N/A;  . BUNIONECTOMY     Right  . CARDIAC CATHETERIZATION  09/25/2000   EF of 50% -- with normal left ventricular size and function  . CARDIOVERSION N/A  12/28/2017   Procedure: CARDIOVERSION;  Surgeon: Dorothy Spark, MD;  [facility] END[facility]COPY;  Service: Cardiovascular;  Laterality: N/A;  . CARDIOVERSION N/A 02/25/2018   Procedure: CARDIOVERSION;  Surgeon: Skeet Latch, MD;  Location: [place];  Service: Cardiovascular;  Laterality: N/A;  . CESAREAN SECTION    . CESAREAN SECTION    . EYE SURGERY Bilateral    Cataract  . FOOT SURGERY    . INSERT / REPLACE / REMOVE PACEMAKER  02/2013  . PACEMAKER INSERTION  02/2013  . PERMANENT PACEMAKER INSERTION N/A 03/02/2013    Procedure: PERMANENT PACEMAKER INSERTION;  Surgeon: Evans Lance, MD;  Location: [facility] CATH LAB;  Service: Cardiovascular;  Laterality: N/A;  . SHOULDER ARTHROSCOPY W/ ROTATOR CUFF REPAIR    . SHOULDER SURGERY Right    roto cuff  . TOE AMPUTATION Left    2nd and 3rd, bunion under toes.  . TONSILLECTOMY    . VARICOSE VEIN SURGERY      Current Outpatient Medications  Medication Sig Dispense Refill  . acetaminophen (TYLENOL) 500 MG tablet Take 1,000 mg by mouth 2 (two) times daily. For pain    . apixaban (ELIQUIS) 5 MG TABS tablet Take 1 tablet (5 mg total) by mouth 2 (two) times daily. 60 tablet 5  . atorvastatin (LIPITOR) 10 MG tablet Take 10 mg by mouth daily.    . dorzolamide (TRUSOPT) 2 % ophthalmic solution Place 1 drop into the right eye 2 (two) times daily.    . dorzolamide-timolol (COSOPT) 22.3-6.8 MG/ML ophthalmic solution Place 1 drop into both eyes 2 (two) times daily.    . furosemide (LASIX) 40 MG tablet Take 1 tablet (40 mg total) by mouth daily. 90 tablet 3  . levothyroxine (SYNTHROID) 50 MCG tablet TAKE 0.5 TABLETS (25 MCG TOTAL) BY MOUTH DAILY. 45 tablet 1  . LORazepam (ATIVAN) 0.5 MG tablet TAKE 1 TABLET (0.5 MG TOTAL) BY MOUTH AT BEDTIME. 30 tablet 2  . Multiple Vitamin (MULTIVITAMIN WITH MINERALS) TABS Take 1 tablet by mouth daily.    Marland Kitchen omeprazole (PRILOSEC) 20 MG capsule Take 20 mg by mouth every evening.      No current facility-administered medications for this visit.    Allergies:   Ace inhibitors, Losartan, Codeine, and Tramadol   Social History:  The patient  reports that she has never smoked. She has never used smokeless tobacco. She reports that she does not drink alcohol and does not use drugs.   Family History:  The patient's family history includes Breast cancer in her sister; Hypertension in her father and mother.  ROS:  Please see the history of present illness.    All other systems are reviewed and otherwise negative.   PHYSICAL EXAM:  VS:  There were  no vitals taken for this visit. BMI: There is no height or weight on file to calculate BMI. Well nourished, well developed, in no acute distress HEENT: normocephalic, atraumatic Neck: no JVD, carotid bruits or masses Cardiac:  RRR; no significant murmurs, no rubs, or gallops Lungs:  CTA b/l, no wheezing, rhonchi or rales Abd: soft, nontender MS: no deformity or atrophy Ext: no edema, chronic looking skin changes Skin: warm and dry, no rash Neuro:  No gross deficits appreciated Psych: euthymic mood, full affect  PPM site is stable, no tethering or discomfort   EKG:  Done today and reviewed by myself shows  AFiv VPaced + V1,- lead I, morphology unchanged from post implant QRS 178ms (pre-CRT QRS was 165ms)  Device interrogation  done today and reviewed by myself:  Battery and lead measurements are good 94% VP She is dependent at 40bpm today HR histograms note some HR 80-90, suspect PVCs No V arrhythmias   05/26/2019: TTE IMPRESSIONS  1. The left ventricle has moderate-severely reduced systolic function,  with an ejection fraction of 30-35%. The cavity size was normal. Left  ventricular diastolic function could not be evaluated secondary to atrial  fibrillation. Left ventricular diffuse  hypokinesis.  2. The right ventricle has normal systolic function. The cavity was  normal. There is no increase in right ventricular wall thickness. Right  ventricular systolic pressure is normal.  3. Left atrial size was moderately dilated.  4. Right atrial size was mildly dilated.  5. There is mild mitral annular calcification present. Mitral valve  regurgitation is moderate by color flow Doppler.  6. The aortic valve is tricuspid. Mild thickening of the aortic valve.  Mild calcification of the aortic valve. Aortic valve regurgitation is mild  by color flow Doppler. No stenosis of the aortic valve.  7. The aorta is normal in size and structure.  8. The aortic root, ascending aorta  and aortic arch are normal in size  and structure.    12/31/2015: stress myoview  There was no ST segment deviation noted during stress.  The study is normal. There is no ischemia.  This is a low risk study.  The study could not be gated. No LV function information is available . The LV cavity size is normal     Recent Labs: 12/11/2020: NT-Pro BNP 844 12/23/2020: BUN 29; Creatinine, Ser 1.11; Potassium 4.4; Sodium 140  No results found for requested labs within last 8760 hours.   CrCl cannot be calculated (Unknown ideal weight.).   Wt Readings from Last 3 Encounters:  12/02/20 184 lb (83.5 kg)  09/10/20 181 lb (82.1 kg)  01/10/20 174 lb (78.9 kg)     Other studies reviewed: Additional studies/records reviewed today include: summarized above  ASSESSMENT AND PLAN:  1. CRT-P     Intact function, no programming changes made  2. NICM 3. Chronic CHF     Exam appears euvolemic     Dr. Radford Pax mentions BP has limited meds     C/w Dr. Radford Pax  Has an echo ordered for Dr. Radford Pax  4. Permanent AFib     CHA2DS2Vasc is 4, on  Eliquis, appropriately dosed for weight/Creat     CBC today    Disposition: F/u with remotes as usual, she has an echo ordered for later this month with Dr. Radford Pax and f/u with her in May, EP in c linic in a year, sooner if needed  Current medicines are reviewed at length with the patient today.  The patient did not have any concerns regarding medicines.  Venetia Night, PA-C 12/30/2020 5:35 PM     [address] [address] [phone] (office)  [phone] (fax)

## 2021-01-01 ENCOUNTER — Ambulatory Visit: Payer: Medicare HMO | Admitting: Physician Assistant

## 2021-01-01 ENCOUNTER — Encounter: Payer: Self-pay | Admitting: Physician Assistant

## 2021-01-01 ENCOUNTER — Other Ambulatory Visit: Payer: Self-pay

## 2021-01-01 VITALS — BP 108/70 | HR 70 | Ht 65.0 in | Wt 182.0 lb

## 2021-01-01 DIAGNOSIS — Z950 Presence of cardiac pacemaker: Secondary | ICD-10-CM | POA: Diagnosis not present

## 2021-01-01 DIAGNOSIS — I428 Other cardiomyopathies: Secondary | ICD-10-CM | POA: Diagnosis not present

## 2021-01-01 DIAGNOSIS — I4821 Permanent atrial fibrillation: Secondary | ICD-10-CM | POA: Diagnosis not present

## 2021-01-01 DIAGNOSIS — Z79899 Other long term (current) drug therapy: Secondary | ICD-10-CM | POA: Diagnosis not present

## 2021-01-01 DIAGNOSIS — I5043 Acute on chronic combined systolic (congestive) and diastolic (congestive) heart failure: Secondary | ICD-10-CM

## 2021-01-01 DIAGNOSIS — I5022 Chronic systolic (congestive) heart failure: Secondary | ICD-10-CM | POA: Diagnosis not present

## 2021-01-01 LAB — CUP PACEART INCLINIC DEVICE CHECK
Date Time Interrogation Session: 20220309160548
Implantable Lead Implant Date: 20140508
Implantable Lead Implant Date: 20140508
Implantable Lead Implant Date: 20201202
Implantable Lead Location: 753858
Implantable Lead Location: 753859
Implantable Lead Location: 753860
Implantable Lead Model: 4135
Implantable Lead Model: 4136
Implantable Lead Model: 4674
Implantable Lead Serial Number: 29333020
Implantable Lead Serial Number: 29379474
Implantable Lead Serial Number: 851686
Implantable Pulse Generator Implant Date: 20201202
Lead Channel Pacing Threshold Amplitude: 0.7 V
Lead Channel Pacing Threshold Amplitude: 0.8 V
Lead Channel Pacing Threshold Pulse Width: 0.4 ms
Lead Channel Pacing Threshold Pulse Width: 0.4 ms
Pulse Gen Serial Number: 750525

## 2021-01-01 LAB — CBC
Hematocrit: 42.5 % (ref 34.0–46.6)
Hemoglobin: 14.2 g/dL (ref 11.1–15.9)
MCH: 33.7 pg — ABNORMAL HIGH (ref 26.6–33.0)
MCHC: 33.4 g/dL (ref 31.5–35.7)
MCV: 101 fL — ABNORMAL HIGH (ref 79–97)
Platelets: 205 10*3/uL (ref 150–450)
RBC: 4.21 x10E6/uL (ref 3.77–5.28)
RDW: 13 % (ref 11.7–15.4)
WBC: 9 10*3/uL (ref 3.4–10.8)

## 2021-01-01 NOTE — Patient Instructions (Signed)
Medication Instructions:   Your physician recommends that you continue on your current medications as directed. Please refer to the Current Medication list given to you today.  *If you need a refill on your cardiac medications before your next appointment, please call your pharmacy*   Lab Work:  CBC  TODAY    If you have labs (blood work) drawn today and your tests are completely normal, you will receive your results only by: Marland Kitchen MyChart Message (if you have MyChart) OR . A paper copy in the mail If you have any lab test that is abnormal or we need to change your treatment, we will call you to review the results.   Testing/Procedures: NONE ORDERED  TODAY    Follow-Up: At [facility], you and your health needs are our priority.  As part of our continuing mission to provide you with exceptional heart care, we have created designated Provider Care Teams.  These Care Teams include your primary Cardiologist (physician) and Advanced Practice Providers (APPs -  Physician Assistants and Nurse Practitioners) who all work together to provide you with the care you need, when you need it.  We recommend signing up for the patient portal called "MyChart".  Sign up information is provided on this After Visit Summary.  MyChart is used to connect with patients for Virtual Visits (Telemedicine).  Patients are able to view lab/test results, encounter notes, upcoming appointments, etc.  Non-urgent messages can be sent to your provider as well.   To learn more about what you can do with MyChart, go to [email or web address].    Your next appointment:   RECALL MAY  2022  WITH DR TURNER  NEEDS TO BE SCHEDULED    Other Instructions

## 2021-01-01 NOTE — Telephone Encounter (Signed)
Error

## 2021-01-07 ENCOUNTER — Ambulatory Visit (HOSPITAL_COMMUNITY): Payer: Medicare HMO | Attending: Cardiovascular Disease

## 2021-01-07 ENCOUNTER — Other Ambulatory Visit: Payer: Self-pay

## 2021-01-07 ENCOUNTER — Encounter: Payer: Self-pay | Admitting: Cardiology

## 2021-01-07 DIAGNOSIS — I351 Nonrheumatic aortic (valve) insufficiency: Secondary | ICD-10-CM | POA: Diagnosis not present

## 2021-01-07 DIAGNOSIS — I5042 Chronic combined systolic (congestive) and diastolic (congestive) heart failure: Secondary | ICD-10-CM

## 2021-01-07 DIAGNOSIS — I340 Nonrheumatic mitral (valve) insufficiency: Secondary | ICD-10-CM

## 2021-01-07 LAB — ECHOCARDIOGRAM COMPLETE
Area-P 1/2: 2.71 cm2
S' Lateral: 3.45 cm

## 2021-01-07 NOTE — Progress Notes (Signed)
Remote pacemaker transmission.   

## 2021-01-08 DIAGNOSIS — C44321 Squamous cell carcinoma of skin of nose: Secondary | ICD-10-CM | POA: Diagnosis not present

## 2021-01-15 ENCOUNTER — Other Ambulatory Visit: Payer: Self-pay | Admitting: Physician Assistant

## 2021-01-18 ENCOUNTER — Other Ambulatory Visit: Payer: Self-pay

## 2021-01-18 ENCOUNTER — Emergency Department (HOSPITAL_COMMUNITY): Payer: Medicare HMO

## 2021-01-18 ENCOUNTER — Emergency Department (HOSPITAL_COMMUNITY)
Admission: EM | Admit: 2021-01-18 | Discharge: 2021-01-18 | Disposition: A | Discharge status: Home / Self Care | Payer: Medicare HMO | Attending: Emergency Medicine | Admitting: Emergency Medicine

## 2021-01-18 ENCOUNTER — Encounter (HOSPITAL_COMMUNITY): Payer: Self-pay | Admitting: Emergency Medicine

## 2021-01-18 DIAGNOSIS — S8002XA Contusion of left knee, initial encounter: Secondary | ICD-10-CM | POA: Diagnosis not present

## 2021-01-18 DIAGNOSIS — I130 Hypertensive heart and chronic kidney disease with heart failure and stage 1 through stage 4 chronic kidney disease, or unspecified chronic kidney disease: Secondary | ICD-10-CM | POA: Insufficient documentation

## 2021-01-18 DIAGNOSIS — W010XXA Fall on same level from slipping, tripping and stumbling without subsequent striking against object, initial encounter: Secondary | ICD-10-CM | POA: Diagnosis not present

## 2021-01-18 DIAGNOSIS — Z950 Presence of cardiac pacemaker: Secondary | ICD-10-CM | POA: Insufficient documentation

## 2021-01-18 DIAGNOSIS — Z7901 Long term (current) use of anticoagulants: Secondary | ICD-10-CM | POA: Insufficient documentation

## 2021-01-18 DIAGNOSIS — M7989 Other specified soft tissue disorders: Secondary | ICD-10-CM | POA: Diagnosis not present

## 2021-01-18 DIAGNOSIS — I4819 Other persistent atrial fibrillation: Secondary | ICD-10-CM | POA: Diagnosis not present

## 2021-01-18 DIAGNOSIS — S8992XA Unspecified injury of left lower leg, initial encounter: Secondary | ICD-10-CM | POA: Diagnosis not present

## 2021-01-18 DIAGNOSIS — I5043 Acute on chronic combined systolic (congestive) and diastolic (congestive) heart failure: Secondary | ICD-10-CM | POA: Diagnosis not present

## 2021-01-18 DIAGNOSIS — M85862 Other specified disorders of bone density and structure, left lower leg: Secondary | ICD-10-CM | POA: Diagnosis not present

## 2021-01-18 DIAGNOSIS — N183 Chronic kidney disease, stage 3 unspecified: Secondary | ICD-10-CM | POA: Insufficient documentation

## 2021-01-18 DIAGNOSIS — Z85828 Personal history of other malignant neoplasm of skin: Secondary | ICD-10-CM | POA: Diagnosis not present

## 2021-01-18 DIAGNOSIS — R600 Localized edema: Secondary | ICD-10-CM | POA: Diagnosis not present

## 2021-01-18 DIAGNOSIS — M1712 Unilateral primary osteoarthritis, left knee: Secondary | ICD-10-CM | POA: Diagnosis not present

## 2021-01-18 DIAGNOSIS — M25562 Pain in left knee: Secondary | ICD-10-CM | POA: Diagnosis not present

## 2021-01-18 NOTE — Discharge Instructions (Signed)
If your pain becomes intractable or you cannot bear weight then return to the ER see your doctor

## 2021-01-18 NOTE — ED Provider Notes (Signed)
[facility] DEPT Provider Note   CSN: [id number] Arrival date & time: 01/18/21  1658     History Chief Complaint  Patient presents with  . Fall    Molly [name] is a 87 y.o. female.  HPI 87 year old female presents after a fall and knee injury.  She fell about 10:30 AM when she tripped on the carpet with her walker.  Landed on both knees.  Was unable to get back on her feet without help from EMS but since then she has been ambulatory.  However she came in because the knee on the left side has been progressively swelling and somewhat painful.  At rest there is minimal to no pain but about 8 out of 10 pain when she walks.  However she is still ambulatory.  She is on Eliquis.  She has been taking Tylenol.  No other injuries.    Past Medical History:  Diagnosis Date  . Aortic insufficiency   . Atrial tachycardia ([place])   . Cancer ([place])    Skin cancer- basal.  1 mylenoma  . Chronic combined systolic and diastolic CHF (congestive heart failure) ([place])   . CKD (chronic kidney disease), stage III ([place])   . Complication of anesthesia   . DCM (dilated cardiomyopathy) ([place])    EF 40-45% by echo 2017 - nuclear stress test with no ischemia  . GERD (gastroesophageal reflux disease)   . H/O benign essential tremor    on amiodarone resolved off amio  . H/O cardiac radiofrequency ablation    a. AV node ablation in 05/2019.  Marland Kitchen H/O hyperkalemia    Resolved off ACE inhibitors  . High cholesterol   . History of blood transfusion   . Hyperkalemia   . Hyperparathyroidism ([place])   . Hypertension   . Mitral regurgitation    trivial by echo 12/2020  . Persistent atrial fibrillation ([place])   . PONV (postoperative nausea and vomiting)   . PVC's (premature ventricular contractions)   . Tachycardia-bradycardia syndrome [facility])    s/p PPM 02/2013    Patient Active Problem List   Diagnosis Date Noted  . Hyperparathyroidism, primary ([place]) 12/04/2020  . Complete heart  block ([place]) 09/27/2019  . Atrial tachycardia ([place]) 09/13/2019  . Iron deficiency anemia 08/14/2019  . Long term (current) use of anticoagulants 07/12/2019  . Acute on chronic combined systolic and diastolic CHF (congestive heart failure) ([place]) 05/25/2019  . CKD (chronic kidney disease), stage III ([place]) 04/24/2019  . Back pain, chronic 09/19/2018  . Anxiety disorder, unspecified 09/19/2018  . Visit for monitoring Tikosyn therapy 02/07/2018  . Chronic combined systolic and diastolic heart failure ([place]) 12/28/2017  . Hypotension 05/17/2017  . SOB (shortness of breath)   . DCM (dilated cardiomyopathy) ([place]) 11/10/2016  . Aortic insufficiency   . Mitral regurgitation 11/06/2015  . Tachycardia-bradycardia syndrome ([place])   . Pacemaker 04/18/2013  . Claw toe, acquired 06/28/2012  . Pain in joint involving ankle and foot 06/28/2012  . PVC (premature ventricular contraction) 06/07/2012  . Fatigue 06/07/2012  . Symptomatic bradycardia 05/23/2012  . Hyperlipidemia 10/29/2009  . PULMONARY FUNCTION TESTS, ABNORMAL 10/29/2009  . Essential hypertension 10/28/2009  . Persistent atrial fibrillation ([place]) 10/28/2009    Past Surgical History:  Procedure Laterality Date  . AMPUTATION Right 11/27/2014   Procedure: RIGHT 2ND AND 3RD TOE AMPUTATION;  Surgeon: Wylene Simmer, MD;  Location: [facility];  Service: Orthopedics;  Laterality: Right;  . ATRIAL FIBRILLATION ABLATION N/A 05/29/2019   Procedure: AVN ABLATION;  Surgeon: Evans Lance, MD;  Location: [facility] CV LAB;  Service: Cardiovascular;  Laterality: N/A;  . BIV UPGRADE N/A 09/27/2019   Procedure: BIV UPGRADE;  Surgeon: Evans Lance, MD;  Location: [facility] CV LAB;  Service: Cardiovascular;  Laterality: N/A;  . BUNIONECTOMY     Right  . CARDIAC CATHETERIZATION  09/25/2000   EF of 50% -- with normal left ventricular size and function  . CARDIOVERSION N/A 12/28/2017   Procedure: CARDIOVERSION;  Surgeon: Dorothy Spark, MD;  Location: [facility]  ENDOSCOPY;  Service: Cardiovascular;  Laterality: N/A;  . CARDIOVERSION N/A 02/25/2018   Procedure: CARDIOVERSION;  Surgeon: Skeet Latch, MD;  Location: [facility];  Service: Cardiovascular;  Laterality: N/A;  . CESAREAN SECTION    . CESAREAN SECTION    . EYE SURGERY Bilateral    Cataract  . FOOT SURGERY    . INSERT / REPLACE / REMOVE PACEMAKER  02/2013  . PACEMAKER INSERTION  02/2013  . PERMANENT PACEMAKER INSERTION N/A 03/02/2013   Procedure: PERMANENT PACEMAKER INSERTION;  Surgeon: Evans Lance, MD;  Location: [facility] CATH LAB;  Service: Cardiovascular;  Laterality: N/A;  . SHOULDER ARTHROSCOPY W/ ROTATOR CUFF REPAIR    . SHOULDER SURGERY Right    roto cuff  . TOE AMPUTATION Left    2nd and 3rd, bunion under toes.  . TONSILLECTOMY    . VARICOSE VEIN SURGERY       OB History   No obstetric history on file.     Family History  Problem Relation Age of Onset  . Hypertension Mother   . Hypertension Father   . Breast cancer Sister   . Hypercalcemia Neg Hx     Social History   Tobacco Use  . Smoking status: Never Smoker  . Smokeless tobacco: Never Used  Vaping Use  . Vaping Use: Never used  Substance Use Topics  . Alcohol use: No  . Drug use: No    Home Medications Prior to Admission medications   Medication Sig Start Date End Date Taking? Authorizing Provider  acetaminophen (TYLENOL) 500 MG tablet Take 1,000 mg by mouth 2 (two) times daily. For pain    [provider]  apixaban (ELIQUIS) 5 MG TABS tablet Take 1 tablet (5 mg total) by mouth 2 (two) times daily. 12/11/20   Sueanne Margarita, MD  atorvastatin (LIPITOR) 10 MG tablet Take 10 mg by mouth daily. 12/13/19   [provider]  dorzolamide (TRUSOPT) 2 % ophthalmic solution Place 1 drop into the right eye 2 (two) times daily. 06/22/12   [provider]  dorzolamide-timolol (COSOPT) 22.3-6.8 MG/ML ophthalmic solution Place 1 drop into both eyes 2 (two) times daily. 06/09/20   [provider]  folic acid (FOLVITE) 1 MG tablet Take 1 tablet by mouth as directed. 6x a week 12/24/20   [provider]  furosemide (LASIX) 40 MG tablet Take 1 tablet (40 mg total) by mouth daily. 01/15/21   Sueanne Margarita, MD  levocetirizine (XYZAL) 5 MG tablet Take 1 tablet by mouth daily as needed. 12/10/20   [provider]  levothyroxine (SYNTHROID) 50 MCG tablet TAKE 0.5 TABLETS (25 MCG TOTAL) BY MOUTH DAILY. 08/30/20   [place], Betty G, MD  LORazepam (ATIVAN) 0.5 MG tablet TAKE 1 TABLET (0.5 MG TOTAL) BY MOUTH AT BEDTIME. 09/04/19   [place], Betty G, MD  methotrexate (RHEUMATREX) 2.5 MG tablet Take 5 mg by mouth once a week. 12/24/20   [provider]  Multiple  Vitamin (MULTIVITAMIN WITH MINERALS) TABS Take 1 tablet by mouth daily.    [provider]  omeprazole (PRILOSEC) 20 MG capsule Take 20 mg by mouth every evening.     [provider]  predniSONE (DELTASONE) 5 MG tablet Take 5 mg by mouth every morning. 12/24/20   [provider]  rosuvastatin (CRESTOR) 5 MG tablet Take 5 mg by mouth daily. 09/04/20   [provider]    Allergies    Ace inhibitors, Losartan, Codeine, and Tramadol  Review of Systems   Review of Systems  Musculoskeletal: Positive for arthralgias and joint swelling.  Neurological: Negative for weakness and numbness.    Physical Exam Updated Vital Signs BP 140/78 (BP Location: Left Arm)   Pulse 70   Temp 97.9 F (36.6 C) (Oral)   Resp 17   SpO2 98%   Physical Exam Vitals and nursing note reviewed.  Constitutional:      Appearance: She is well-developed.  HENT:     Head: Normocephalic and atraumatic.     Right Ear: External ear normal.     Left Ear: External ear normal.     Nose: Nose normal.  Eyes:     General:        Right eye: No discharge.        Left eye: No discharge.  Cardiovascular:     Rate and Rhythm: Normal rate and regular rhythm.     Pulses:          Dorsalis pedis pulses are 2+  on the left side.  Pulmonary:     Effort: Pulmonary effort is normal.  Abdominal:     General: There is no distension.  Musculoskeletal:     Left knee: Ecchymosis present. Normal range of motion. Tenderness present.     Left lower leg: Swelling and tenderness present. No bony tenderness.       Legs:     Comments: Significant ecchymosis and swelling to knee/proximal lower leg Normal strength/sensation in left foot  Skin:    General: Skin is warm and dry.  Neurological:     Mental Status: She is alert.  Psychiatric:        Mood and Affect: Mood is not anxious.     ED Results / Procedures / Treatments   Labs (all labs ordered are listed, but only abnormal results are displayed) Labs Reviewed - No data to display  EKG None  Radiology DG Tibia/Fibula Left  Result Date: 01/18/2021 CLINICAL DATA:  [place], swelling, anterior left knee pain EXAM: LEFT TIBIA AND FIBULA - 2 VIEW COMPARISON:  None. FINDINGS: Frontal and lateral views of the left tibia and fibula are obtained. Proximal tibia and fibula are excluded on this exam but are included on the separately reported knee evaluation. Bones are osteopenic. No acute displaced fracture. Alignment of the left ankle is anatomic. There is diffuse subcutaneous edema. IMPRESSION: 1. Osteopenia.  No acute fracture. 2. Diffuse subcutaneous edema. Electronically Signed   By: Randa Ngo M.D.   On: 01/18/2021 17:51   DG Knee Complete 4 Views Left  Result Date: 01/18/2021 CLINICAL DATA:  Fall EXAM: LEFT KNEE - COMPLETE 4+ VIEW COMPARISON:  None. FINDINGS: Osteopenia. No fracture or dislocation of the left knee. Mild tricompartmental joint space narrowing osteophytosis. No knee joint effusion. Soft tissue edema anteriorly. IMPRESSION: 1. No fracture or dislocation of the left knee. 2. Osteopenia. 3. Mild tricompartmental osteoarthritis. 4. Soft tissue edema anteriorly. Electronically Signed   By: Dorna Bloom.D.  On: 01/18/2021 17:59     Procedures Procedures   Medications Ordered in ED Medications - No data to display  ED Course  I have reviewed the triage vital signs and the nursing notes.  Pertinent labs & imaging results that were available during my care of the patient were reviewed by me and considered in my medical decision making (see chart for details).    MDM Rules/Calculators/A&P                          Patient's xrays have been reviewed and are unremarkable from a trauma standpoint. The ecchymosis/swelling seems to be outside the joint. She has been ambulatory at home and doesn't want to test it here. She did not hit her head and has no headache. Declines anything stronger than tylenol and doesn't want anything here. D/c home with return precautions.  Final Clinical Impression(s) / ED Diagnoses Final diagnoses:  Contusion of left knee, initial encounter    Rx / DC Orders ED Discharge Orders    None       Sherwood Gambler, MD 01/18/21 2127

## 2021-01-18 NOTE — ED Triage Notes (Signed)
Patient reports fall this morning. Reports worsening pain and swelling to left knee. Bruising noted to knee. Takes blood thinner. Denies head injury and LOC.

## 2021-01-27 DIAGNOSIS — I4891 Unspecified atrial fibrillation: Secondary | ICD-10-CM | POA: Diagnosis not present

## 2021-01-27 DIAGNOSIS — S8002XA Contusion of left knee, initial encounter: Secondary | ICD-10-CM | POA: Diagnosis not present

## 2021-01-27 DIAGNOSIS — R58 Hemorrhage, not elsewhere classified: Secondary | ICD-10-CM | POA: Diagnosis not present

## 2021-01-27 DIAGNOSIS — Z7901 Long term (current) use of anticoagulants: Secondary | ICD-10-CM | POA: Diagnosis not present

## 2021-01-28 DIAGNOSIS — Z5181 Encounter for therapeutic drug level monitoring: Secondary | ICD-10-CM | POA: Diagnosis not present

## 2021-01-28 DIAGNOSIS — L299 Pruritus, unspecified: Secondary | ICD-10-CM | POA: Diagnosis not present

## 2021-01-28 DIAGNOSIS — L089 Local infection of the skin and subcutaneous tissue, unspecified: Secondary | ICD-10-CM | POA: Diagnosis not present

## 2021-02-10 DIAGNOSIS — C44321 Squamous cell carcinoma of skin of nose: Secondary | ICD-10-CM | POA: Diagnosis not present

## 2021-02-17 DIAGNOSIS — M7051 Other bursitis of knee, right knee: Secondary | ICD-10-CM | POA: Diagnosis not present

## 2021-02-17 DIAGNOSIS — M7052 Other bursitis of knee, left knee: Secondary | ICD-10-CM | POA: Diagnosis not present

## 2021-02-17 DIAGNOSIS — M170 Bilateral primary osteoarthritis of knee: Secondary | ICD-10-CM | POA: Diagnosis not present

## 2021-02-27 DIAGNOSIS — C44321 Squamous cell carcinoma of skin of nose: Secondary | ICD-10-CM | POA: Diagnosis not present

## 2021-03-04 DIAGNOSIS — Z950 Presence of cardiac pacemaker: Secondary | ICD-10-CM | POA: Diagnosis not present

## 2021-03-04 DIAGNOSIS — Z7901 Long term (current) use of anticoagulants: Secondary | ICD-10-CM | POA: Diagnosis not present

## 2021-03-04 DIAGNOSIS — K219 Gastro-esophageal reflux disease without esophagitis: Secondary | ICD-10-CM | POA: Diagnosis not present

## 2021-03-04 DIAGNOSIS — R32 Unspecified urinary incontinence: Secondary | ICD-10-CM | POA: Diagnosis not present

## 2021-03-04 DIAGNOSIS — I420 Dilated cardiomyopathy: Secondary | ICD-10-CM | POA: Diagnosis not present

## 2021-03-04 DIAGNOSIS — M1711 Unilateral primary osteoarthritis, right knee: Secondary | ICD-10-CM | POA: Diagnosis not present

## 2021-03-04 DIAGNOSIS — I5032 Chronic diastolic (congestive) heart failure: Secondary | ICD-10-CM | POA: Diagnosis not present

## 2021-03-04 DIAGNOSIS — Z89421 Acquired absence of other right toe(s): Secondary | ICD-10-CM | POA: Diagnosis not present

## 2021-03-04 DIAGNOSIS — L299 Pruritus, unspecified: Secondary | ICD-10-CM | POA: Diagnosis not present

## 2021-03-04 DIAGNOSIS — R69 Illness, unspecified: Secondary | ICD-10-CM | POA: Diagnosis not present

## 2021-03-04 DIAGNOSIS — C44300 Unspecified malignant neoplasm of skin of unspecified part of face: Secondary | ICD-10-CM | POA: Diagnosis not present

## 2021-03-04 DIAGNOSIS — I130 Hypertensive heart and chronic kidney disease with heart failure and stage 1 through stage 4 chronic kidney disease, or unspecified chronic kidney disease: Secondary | ICD-10-CM | POA: Diagnosis not present

## 2021-03-04 DIAGNOSIS — I447 Left bundle-branch block, unspecified: Secondary | ICD-10-CM | POA: Diagnosis not present

## 2021-03-04 DIAGNOSIS — E039 Hypothyroidism, unspecified: Secondary | ICD-10-CM | POA: Diagnosis not present

## 2021-03-04 DIAGNOSIS — Z89422 Acquired absence of other left toe(s): Secondary | ICD-10-CM | POA: Diagnosis not present

## 2021-03-04 DIAGNOSIS — I080 Rheumatic disorders of both mitral and aortic valves: Secondary | ICD-10-CM | POA: Diagnosis not present

## 2021-03-04 DIAGNOSIS — I428 Other cardiomyopathies: Secondary | ICD-10-CM | POA: Diagnosis not present

## 2021-03-04 DIAGNOSIS — I4821 Permanent atrial fibrillation: Secondary | ICD-10-CM | POA: Diagnosis not present

## 2021-03-04 DIAGNOSIS — M519 Unspecified thoracic, thoracolumbar and lumbosacral intervertebral disc disorder: Secondary | ICD-10-CM | POA: Diagnosis not present

## 2021-03-04 DIAGNOSIS — N183 Chronic kidney disease, stage 3 unspecified: Secondary | ICD-10-CM | POA: Diagnosis not present

## 2021-03-04 DIAGNOSIS — E213 Hyperparathyroidism, unspecified: Secondary | ICD-10-CM | POA: Diagnosis not present

## 2021-03-04 DIAGNOSIS — Z483 Aftercare following surgery for neoplasm: Secondary | ICD-10-CM | POA: Diagnosis not present

## 2021-03-04 DIAGNOSIS — E7800 Pure hypercholesterolemia, unspecified: Secondary | ICD-10-CM | POA: Diagnosis not present

## 2021-03-04 DIAGNOSIS — I495 Sick sinus syndrome: Secondary | ICD-10-CM | POA: Diagnosis not present

## 2021-03-10 ENCOUNTER — Emergency Department (HOSPITAL_COMMUNITY): Payer: Medicare HMO

## 2021-03-10 ENCOUNTER — Other Ambulatory Visit: Payer: Self-pay

## 2021-03-10 ENCOUNTER — Observation Stay (HOSPITAL_COMMUNITY)
Admission: EM | Admit: 2021-03-10 | Discharge: 2021-03-12 | Disposition: A | Discharge status: Home / Self Care | Payer: Medicare HMO | Attending: Internal Medicine | Admitting: Internal Medicine

## 2021-03-10 DIAGNOSIS — R479 Unspecified speech disturbances: Secondary | ICD-10-CM | POA: Diagnosis present

## 2021-03-10 DIAGNOSIS — I4819 Other persistent atrial fibrillation: Secondary | ICD-10-CM | POA: Diagnosis not present

## 2021-03-10 DIAGNOSIS — Z950 Presence of cardiac pacemaker: Secondary | ICD-10-CM | POA: Insufficient documentation

## 2021-03-10 DIAGNOSIS — N1831 Chronic kidney disease, stage 3a: Secondary | ICD-10-CM | POA: Diagnosis not present

## 2021-03-10 DIAGNOSIS — Z79899 Other long term (current) drug therapy: Secondary | ICD-10-CM | POA: Insufficient documentation

## 2021-03-10 DIAGNOSIS — R4702 Dysphasia: Secondary | ICD-10-CM | POA: Diagnosis not present

## 2021-03-10 DIAGNOSIS — I130 Hypertensive heart and chronic kidney disease with heart failure and stage 1 through stage 4 chronic kidney disease, or unspecified chronic kidney disease: Secondary | ICD-10-CM | POA: Insufficient documentation

## 2021-03-10 DIAGNOSIS — I5042 Chronic combined systolic (congestive) and diastolic (congestive) heart failure: Secondary | ICD-10-CM | POA: Diagnosis not present

## 2021-03-10 DIAGNOSIS — Z743 Need for continuous supervision: Secondary | ICD-10-CM | POA: Diagnosis not present

## 2021-03-10 DIAGNOSIS — Y900 Blood alcohol level of less than 20 mg/100 ml: Secondary | ICD-10-CM | POA: Diagnosis not present

## 2021-03-10 DIAGNOSIS — R4781 Slurred speech: Secondary | ICD-10-CM | POA: Diagnosis not present

## 2021-03-10 DIAGNOSIS — Z85828 Personal history of other malignant neoplasm of skin: Secondary | ICD-10-CM | POA: Diagnosis not present

## 2021-03-10 DIAGNOSIS — Z20822 Contact with and (suspected) exposure to covid-19: Secondary | ICD-10-CM | POA: Diagnosis not present

## 2021-03-10 DIAGNOSIS — R69 Illness, unspecified: Secondary | ICD-10-CM | POA: Diagnosis not present

## 2021-03-10 DIAGNOSIS — F419 Anxiety disorder, unspecified: Secondary | ICD-10-CM | POA: Diagnosis present

## 2021-03-10 DIAGNOSIS — G459 Transient cerebral ischemic attack, unspecified: Principal | ICD-10-CM | POA: Diagnosis present

## 2021-03-10 DIAGNOSIS — R2681 Unsteadiness on feet: Secondary | ICD-10-CM | POA: Diagnosis not present

## 2021-03-10 DIAGNOSIS — I10 Essential (primary) hypertension: Secondary | ICD-10-CM | POA: Diagnosis not present

## 2021-03-10 DIAGNOSIS — E039 Hypothyroidism, unspecified: Secondary | ICD-10-CM | POA: Diagnosis not present

## 2021-03-10 DIAGNOSIS — N183 Chronic kidney disease, stage 3 unspecified: Secondary | ICD-10-CM | POA: Diagnosis present

## 2021-03-10 DIAGNOSIS — Z7901 Long term (current) use of anticoagulants: Secondary | ICD-10-CM | POA: Insufficient documentation

## 2021-03-10 DIAGNOSIS — R42 Dizziness and giddiness: Secondary | ICD-10-CM | POA: Diagnosis not present

## 2021-03-10 DIAGNOSIS — R410 Disorientation, unspecified: Secondary | ICD-10-CM | POA: Diagnosis not present

## 2021-03-10 LAB — COMPREHENSIVE METABOLIC PANEL
ALT: 12 U/L (ref 0–44)
AST: 26 U/L (ref 15–41)
Albumin: 3.7 g/dL (ref 3.5–5.0)
Alkaline Phosphatase: 64 U/L (ref 38–126)
Anion gap: 6 (ref 5–15)
BUN: 22 mg/dL (ref 8–23)
CO2: 25 mmol/L (ref 22–32)
Calcium: 10.8 mg/dL — ABNORMAL HIGH (ref 8.9–10.3)
Chloride: 105 mmol/L (ref 98–111)
Creatinine, Ser: 1.23 mg/dL — ABNORMAL HIGH (ref 0.44–1.00)
GFR, Estimated: 43 mL/min — ABNORMAL LOW (ref >60)
Glucose, Bld: 183 mg/dL — ABNORMAL HIGH (ref 70–99)
Potassium: 4.2 mmol/L (ref 3.5–5.1)
Sodium: 136 mmol/L (ref 135–145)
Total Bilirubin: 0.6 mg/dL (ref 0.3–1.2)
Total Protein: 6.5 g/dL (ref 6.5–8.1)

## 2021-03-10 LAB — ETHANOL: Alcohol, Ethyl (B): <10 mg/dL (ref <10)

## 2021-03-10 LAB — URINALYSIS, ROUTINE W REFLEX MICROSCOPIC
Bilirubin Urine: NEGATIVE
Glucose, UA: NEGATIVE mg/dL
Hgb urine dipstick: NEGATIVE
Ketones, ur: NEGATIVE mg/dL
Nitrite: NEGATIVE
Protein, ur: NEGATIVE mg/dL
Specific Gravity, Urine: 1.011 (ref 1.005–1.030)
pH: 6 (ref 5.0–8.0)

## 2021-03-10 LAB — I-STAT CHEM 8, ED
BUN: 26 mg/dL — ABNORMAL HIGH (ref 8–23)
Calcium, Ion: 1.42 mmol/L — ABNORMAL HIGH (ref 1.15–1.40)
Chloride: 106 mmol/L (ref 98–111)
Creatinine, Ser: 1.1 mg/dL — ABNORMAL HIGH (ref 0.44–1.00)
Glucose, Bld: 171 mg/dL — ABNORMAL HIGH (ref 70–99)
HCT: 39 % (ref 36.0–46.0)
Hemoglobin: 13.3 g/dL (ref 12.0–15.0)
Potassium: 4.2 mmol/L (ref 3.5–5.1)
Sodium: 138 mmol/L (ref 135–145)
TCO2: 23 mmol/L (ref 22–32)

## 2021-03-10 LAB — DIFFERENTIAL
Abs Immature Granulocytes: 0.04 10*3/uL (ref 0.00–0.07)
Basophils Absolute: 0 10*3/uL (ref 0.0–0.1)
Basophils Relative: 1 %
Eosinophils Absolute: 0.3 10*3/uL (ref 0.0–0.5)
Eosinophils Relative: 4 %
Immature Granulocytes: 1 %
Lymphocytes Relative: 21 %
Lymphs Abs: 1.6 10*3/uL (ref 0.7–4.0)
Monocytes Absolute: 0.6 10*3/uL (ref 0.1–1.0)
Monocytes Relative: 8 %
Neutro Abs: 5 10*3/uL (ref 1.7–7.7)
Neutrophils Relative %: 65 %

## 2021-03-10 LAB — PROTIME-INR
INR: 1.4 — ABNORMAL HIGH (ref 0.8–1.2)
Prothrombin Time: 16.9 seconds — ABNORMAL HIGH (ref 11.4–15.2)

## 2021-03-10 LAB — CBC
HCT: 39.7 % (ref 36.0–46.0)
Hemoglobin: 12.5 g/dL (ref 12.0–15.0)
MCH: 34.4 pg — ABNORMAL HIGH (ref 26.0–34.0)
MCHC: 31.5 g/dL (ref 30.0–36.0)
MCV: 109.4 fL — ABNORMAL HIGH (ref 80.0–100.0)
Platelets: 170 10*3/uL (ref 150–400)
RBC: 3.63 MIL/uL — ABNORMAL LOW (ref 3.87–5.11)
RDW: 15.5 % (ref 11.5–15.5)
WBC: 7.6 10*3/uL (ref 4.0–10.5)
nRBC: 0 % (ref 0.0–0.2)

## 2021-03-10 LAB — RESP PANEL BY RT-PCR (FLU A&B, COVID) ARPGX2
Influenza A by PCR: NEGATIVE
Influenza B by PCR: NEGATIVE
SARS Coronavirus 2 by RT PCR: NEGATIVE

## 2021-03-10 LAB — APTT: aPTT: 29 seconds (ref 24–36)

## 2021-03-10 LAB — RAPID URINE DRUG SCREEN, HOSP PERFORMED
Amphetamines: NOT DETECTED
Barbiturates: NOT DETECTED
Benzodiazepines: NOT DETECTED
Cocaine: NOT DETECTED
Opiates: NOT DETECTED
Tetrahydrocannabinol: NOT DETECTED

## 2021-03-10 MED ORDER — DORZOLAMIDE HCL-TIMOLOL MAL 2-0.5 % OP SOLN
1.0000 [drp] | Freq: Two times a day (BID) | OPHTHALMIC | Status: DC
  Administered 2021-03-11 – 2021-03-12 (×3): 1 [drp] via OPHTHALMIC
  Filled 2021-03-10: qty 10

## 2021-03-10 MED ORDER — ACETAMINOPHEN 160 MG/5ML PO SOLN: 650.0000 mg | ORAL | Status: DC | PRN

## 2021-03-10 MED ORDER — APIXABAN 5 MG PO TABS: 5.0000 mg | ORAL_TABLET | Freq: Two times a day (BID) | ORAL | Status: DC

## 2021-03-10 MED ORDER — LATANOPROST 0.005 % OP SOLN
1.0000 [drp] | Freq: Every day | OPHTHALMIC | Status: DC
  Administered 2021-03-11: 1 [drp] via OPHTHALMIC
  Filled 2021-03-10: qty 2.5

## 2021-03-10 MED ORDER — ACETAMINOPHEN 325 MG PO TABS
650.0000 mg | ORAL_TABLET | ORAL | Status: DC | PRN
  Administered 2021-03-11 – 2021-03-12 (×2): 650 mg via ORAL
  Filled 2021-03-10 (×3): qty 2

## 2021-03-10 MED ORDER — ACETAMINOPHEN 650 MG RE SUPP: 650.0000 mg | RECTAL | Status: DC | PRN

## 2021-03-10 MED ORDER — PREDNISONE 5 MG PO TABS
5.0000 mg | ORAL_TABLET | Freq: Every morning | ORAL | Status: DC
  Administered 2021-03-11 – 2021-03-12 (×2): 5 mg via ORAL
  Filled 2021-03-10 (×2): qty 1

## 2021-03-10 MED ORDER — PANTOPRAZOLE SODIUM 40 MG PO TBEC
40.0000 mg | DELAYED_RELEASE_TABLET | Freq: Every day | ORAL | Status: DC
  Administered 2021-03-11 – 2021-03-12 (×2): 40 mg via ORAL
  Filled 2021-03-10 (×2): qty 1

## 2021-03-10 MED ORDER — STROKE: EARLY STAGES OF RECOVERY BOOK: Freq: Once | Status: DC

## 2021-03-10 MED ORDER — LEVOTHYROXINE SODIUM 25 MCG PO TABS
25.0000 ug | ORAL_TABLET | Freq: Every day | ORAL | Status: DC
  Administered 2021-03-11 – 2021-03-12 (×2): 25 ug via ORAL
  Filled 2021-03-10 (×2): qty 1

## 2021-03-10 MED ORDER — LORATADINE 10 MG PO TABS: 10.0000 mg | ORAL_TABLET | Freq: Every day | ORAL | Status: DC | PRN

## 2021-03-10 MED ORDER — LORAZEPAM 0.5 MG PO TABS
0.5000 mg | ORAL_TABLET | Freq: Every day | ORAL | Status: DC | PRN
  Administered 2021-03-11: 0.5 mg via ORAL
  Filled 2021-03-10: qty 1

## 2021-03-10 MED ORDER — METHOTREXATE 2.5 MG PO TABS
5.0000 mg | ORAL_TABLET | ORAL | Status: DC
Start: 2021-03-11 — End: 2021-03-12
  Administered 2021-03-11: 5 mg via ORAL
  Filled 2021-03-10: qty 2

## 2021-03-10 MED ORDER — FOLIC ACID 1 MG PO TABS
1.0000 mg | ORAL_TABLET | ORAL | Status: DC
  Administered 2021-03-12: 1 mg via ORAL
  Filled 2021-03-10: qty 1

## 2021-03-10 NOTE — ED Provider Notes (Addendum)
Pt seen in conjunction with H Sage, PA-C.  Please see her notes for further history.  In brief, patient presenting for evaluation of an episode of aphasia.  Symptoms began around 430 pm today, lasted for approximately 30 minutes, before resolving on their own.  No unilateral new weakness during this time.  Patient has not had symptoms like this before.  She currently feels well, and son reports she is speaking at baseline.  No dizziness, lightheadedness, vision changes, current slurred speech, chest pain, shortness of breath, nausea, vomiting, abdominal pain, urinary symptoms, normal bowel movements.  Patient states she had a fall about 8 weeks ago, since then has had bilateral leg weakness with left leg swelling.  She was evaluated at that time.  She is on a blood thinner.  She has never had a stroke nor stroke work-up.  Last dose of Eliquis was today, she is on this for A. Fib.  On exam, patient is nontoxic.  No focal unilateral weakness.  Speech is normal.  Labs overall reassuring.  CT head negative.  Concern for TIA.  ABCD score of 4.  Will consult with neurology and plan for admission.  Discussed with Dr. Curly Shores from neurology who recommended MRI and MRA of the head and neck.  Recommends carotid ultrasound imaging.  Recommends admission to medicine and they will consult.  Discussed with Dr. Marlowe Sax from [facility] hospitalist service, patient to be admitted   Franchot Heidelberg, PA-C 03/10/21 2104    Franchot Heidelberg, PA-C 03/10/21 2145    Lorelle Gibbs, DO 03/11/21 2337

## 2021-03-10 NOTE — ED Notes (Signed)
Lab to add-on folate

## 2021-03-10 NOTE — Consult Note (Addendum)
Neurology Consultation Reason for Consult: Transient speech difficulty Requesting Physician: Lavenia Atlas  CC: Transient confusion  History is obtained from: Patient and son at bedside  HPI: Molly [name] is a 87 y.o. female with a past medical history significant for atrial fibrillation (on Eliquis, last dose 5/16 a.m.), hypertension, tachy/brady syndrome s/p pacemaker (02/2013), congestive heart failure, CKD, tremor which resolved with discontinuation of amiodarone, chronic pruritus (on methotrexate and recently stopped prednisone), skin cancer recently s/p excision (02/27/2021), anxiety/depression on lorazepam 0.5 mg nightly as needed, hyperparathryoidism, chronic pain managed largely with Tylenol as well as steroid injections in her back/knees.  She reports that at 4:30 PM today she was talking to her husband and suddenly she was confused had difficulty understanding the conversation, had garbled speech.  EMS was activated and her symptoms were improving but still present on their arrival.  However by the time she reached the emergency room her symptoms had resolved (30 to 60 minutes).  She reports no missed doses of her Eliquis.  She has been having a slight headache which is her typical right-sided headache that occurs 2-3 times weekly but is typically managed easily with Tylenol (which she takes 2-3 times a day for her other chronic pain issues).  She reports that she did have a fall about 8 weeks ago where she fell on her left leg and has had a significant amount of pain in both knees since then.  She did get some shots in both knees 3 weeks ago with perhaps some slight improvement.  Despite her pain she is still ambulatory with a walker and she has no bowel or bladder symptoms such as incontinence/diarrhea or new constipation.  She generally has been feeling "really bad" for the past few weeks (weak, tired, and potentially she attributes her depression to her pain/weakness/fatigue)  LKW: 4:30  PM tPA given?: No, symptoms resolved by time of presentation Premorbid modified rankin scale:     1 - No significant disability. Able to carry out all usual activities, despite some symptoms.  ROS: All other review of systems was negative except as noted in the HPI.    Past Medical History:  Diagnosis Date  . Aortic insufficiency   . Atrial tachycardia ([place])   . Cancer ([place])    Skin cancer- basal.  1 mylenoma  . Chronic combined systolic and diastolic CHF (congestive heart failure) ([place])   . CKD (chronic kidney disease), stage III ([place])   . Complication of anesthesia   . DCM (dilated cardiomyopathy) ([place])    EF 40-45% by echo 2017 - nuclear stress test with no ischemia  . GERD (gastroesophageal reflux disease)   . H/O benign essential tremor    on amiodarone resolved off amio  . H/O cardiac radiofrequency ablation    a. AV node ablation in 05/2019.  Marland Kitchen H/O hyperkalemia    Resolved off ACE inhibitors  . High cholesterol   . History of blood transfusion   . Hyperkalemia   . Hyperparathyroidism ([place])   . Hypertension   . Mitral regurgitation    trivial by echo 12/2020  . Persistent atrial fibrillation ([place])   . PONV (postoperative nausea and vomiting)   . PVC's (premature ventricular contractions)   . Tachycardia-bradycardia syndrome [facility])    s/p PPM 02/2013   Past Surgical History:  Procedure Laterality Date  . AMPUTATION Right 11/27/2014   Procedure: RIGHT 2ND AND 3RD TOE AMPUTATION;  Surgeon: Wylene Simmer, MD;  Location: [place];  Service: Orthopedics;  Laterality:  Right;  . ATRIAL FIBRILLATION ABLATION N/A 05/29/2019   Procedure: AVN ABLATION;  Surgeon: Evans Lance, MD;  Location: [place] [facility];  Service: Cardiovascular;  Laterality: N/A;  . BIV UPGRADE N/A 09/27/2019   Procedure: BIV UPGRADE;  Surgeon: Evans Lance, MD;  Location: [place] [facility];  Service: Cardiovascular;  Laterality: N/A;  . BUNIONECTOMY     Right  . CARDIAC CATHETERIZATION  09/25/2000   EF of  50% -- with normal left ventricular size and function  . CARDIOVERSION N/A 12/28/2017   Procedure: CARDIOVERSION;  Surgeon: Dorothy Spark, MD;  Location: [facility] ENDOSCOPY;  Service: Cardiovascular;  Laterality: N/A;  . CARDIOVERSION N/A 02/25/2018   Procedure: CARDIOVERSION;  Surgeon: Skeet Latch, MD;  Location: [place];  Service: Cardiovascular;  Laterality: N/A;  . CESAREAN SECTION    . CESAREAN SECTION    . EYE SURGERY Bilateral    Cataract  . FOOT SURGERY    . INSERT / REPLACE / REMOVE PACEMAKER  02/2013  . PACEMAKER INSERTION  02/2013  . PERMANENT PACEMAKER INSERTION N/A 03/02/2013   Procedure: PERMANENT PACEMAKER INSERTION;  Surgeon: Evans Lance, MD;  Location: [facility] CATH LAB;  Service: Cardiovascular;  Laterality: N/A;  . SHOULDER ARTHROSCOPY W/ ROTATOR CUFF REPAIR    . SHOULDER SURGERY Right    roto cuff  . TOE AMPUTATION Left    2nd and 3rd, bunion under toes.  . TONSILLECTOMY    . VARICOSE VEIN SURGERY     Current Outpatient Medications  Medication Instructions  . acetaminophen (TYLENOL) 1,000 mg, Oral, 2 times daily, For pain   . apixaban (ELIQUIS) 5 mg, Oral, 2 times daily  . atorvastatin (LIPITOR) 10 mg, Oral, Daily at bedtime  . dorzolamide-timolol (COSOPT) 22.3-6.8 MG/ML ophthalmic solution 1 drop, Both Eyes, 2 times daily  . doxycycline (VIBRAMYCIN) 100 mg, See admin instructions  . folic acid (FOLVITE) 1 MG tablet 1 tablet, Oral, As directed, 6x a week Monday,Wednesday,Thursday,Friday,Saturday and Sunday.  . furosemide (LASIX) 40 mg, Oral, Daily  . latanoprost (XALATAN) 0.005 % ophthalmic solution 1 drop, Both Eyes, Daily at bedtime  . levocetirizine (XYZAL) 5 MG tablet 1 tablet, Oral, Daily PRN  . levothyroxine (SYNTHROID) 25 mcg, Oral, Daily  . levothyroxine (SYNTHROID) 25 mcg, Oral, Daily before breakfast  . LORazepam (ATIVAN) 0.5 mg, Oral, Daily at bedtime  . methotrexate (RHEUMATREX) 5 mg, Oral, Every Tue  . omeprazole (PRILOSEC) 20 mg, Oral, Daily  .  predniSONE (DELTASONE) 5 mg, Oral, Every morning  . rosuvastatin (CRESTOR) 5 mg, Oral, Daily   She reports to me that her prednisone is no longer being prescribed due to concern for side effects although it is only medication that helps her itching.  She has been on an increased dose of methotrexate.  Prior notes document intolerance to atorvastatin (myalgia).  It appears pharmacy medication reconciliation is still in process   Family History  Problem Relation Age of Onset  . Hypertension Mother   . Hypertension Father   . Breast cancer Sister   . Hypercalcemia Neg Hx    Past Surgical History:  Procedure Laterality Date  . AMPUTATION Right 11/27/2014   Procedure: RIGHT 2ND AND 3RD TOE AMPUTATION;  Surgeon: Wylene Simmer, MD;  Location: [place];  Service: Orthopedics;  Laterality: Right;  . ATRIAL FIBRILLATION ABLATION N/A 05/29/2019   Procedure: AVN ABLATION;  Surgeon: Evans Lance, MD;  Location: [place] [facility];  Service: Cardiovascular;  Laterality: N/A;  . BIV UPGRADE N/A  09/27/2019   Procedure: BIV UPGRADE;  Surgeon: Evans Lance, MD;  Location: [place] [facility];  Service: Cardiovascular;  Laterality: N/A;  . BUNIONECTOMY     Right  . CARDIAC CATHETERIZATION  09/25/2000   EF of 50% -- with normal left ventricular size and function  . CARDIOVERSION N/A 12/28/2017   Procedure: CARDIOVERSION;  Surgeon: Dorothy Spark, MD;  Location: [facility] ENDOSCOPY;  Service: Cardiovascular;  Laterality: N/A;  . CARDIOVERSION N/A 02/25/2018   Procedure: CARDIOVERSION;  Surgeon: Skeet Latch, MD;  Location: [place];  Service: Cardiovascular;  Laterality: N/A;  . CESAREAN SECTION    . CESAREAN SECTION    . EYE SURGERY Bilateral    Cataract  . FOOT SURGERY    . INSERT / REPLACE / REMOVE PACEMAKER  02/2013  . PACEMAKER INSERTION  02/2013  . PERMANENT PACEMAKER INSERTION N/A 03/02/2013   Procedure: PERMANENT PACEMAKER INSERTION;  Surgeon: Evans Lance, MD;  Location: [facility] CATH LAB;  Service:  Cardiovascular;  Laterality: N/A;  . SHOULDER ARTHROSCOPY W/ ROTATOR CUFF REPAIR    . SHOULDER SURGERY Right    roto cuff  . TOE AMPUTATION Left    2nd and 3rd, bunion under toes.  . TONSILLECTOMY    . VARICOSE VEIN SURGERY       Social History:  reports that she has never smoked. She has never used smokeless tobacco. She reports that she does not drink alcohol and does not use drugs.  Exam: Current vital signs: BP (!) 161/70   Pulse 70   Temp 97.6 F (36.4 C) (Oral)   Resp 19   Ht 5\' 4"  (1.626 m)   Wt 81.6 kg   SpO2 97%   BMI 30.90 kg/m  Vital signs in last 24 hours: Temp:  [97.6 F (36.4 C)] 97.6 F (36.4 C) (05/16 1827) Pulse Rate:  [70] 70 (05/16 1957) Resp:  [17-22] 19 (05/16 1957) BP: (161-173)/(70-80) 161/70 (05/16 1957) SpO2:  [97 %-100 %] 97 % (05/16 1957) Weight:  [81.6 kg] 81.6 kg (05/16 1828)   Physical Exam  Constitutional: Appears well-developed and well-nourished.  Psych: Affect flat, but cooperative and calm Eyes: No scleral injection HENT: No oropharyngeal obstruction, dentures in place.  MSK: Bilateral foot deformities with amputations of some of the toes Cardiovascular: Paced at 70, perfusing extremities well Respiratory: Effort normal, non-labored breathing GI: Soft.  No distension. There is no tenderness.  Skin: Scattered bruising throughout.  Left lower extremity edema greater than the right lower extremity  Neuro: Mental Status: Patient is awake, alert, oriented to person, place, month, year, and situation, though she initially states it is June she self corrects Patient is able to give a clear and coherent history. No signs of aphasia or neglect Cranial Nerves: II: Visual Fields are full. Pupils are equal, round, and reactive to light.  4 to 3 mm III,IV, VI: EOMI with bilateral baseline ptosis, without diploplia.  V: Facial sensation is symmetric to light touch VII: Facial movement is symmetric.  VIII: hearing is intact to voice X:  Uvula elevates symmetrically XI: Shoulder shrug is symmetric. XII: tongue is midline without atrophy or fasciculations.  Motor: Tone is normal. Bulk is normal.  Strength exam is extremely pain/effort limited.  She reports worse pain in the right upper extremity and the left lower extremity.  In this setting she does have a right upper extremity pronator drift which she and son state is her baseline.  She is 4 -/5 in the right upper extremity,  4/5 in the left upper extremity, 4-/5 throughout the bilateral lower extremities Sensory: Sensation is symmetric to light touch and temperature in the arms and legs. Deep Tendon Reflexes: None patient declines reflex hammer testing due to her pain.  Brachioradialis is easily elicitable by finger tapping therefore 3+ and symmetric.  She winces and jumps with even finger tapping attempts of patellar reflex Plantars: Toes are downgoing bilaterally.  Cerebellar: Finger-nose is intact bilaterally, slightly pain limited.  Heel-to-shin is not performed by the patient secondary to pain  NIHSS total 3 (all chronic) Score breakdown: One-point for right upper extremity drift, one-point for right lower extremity drift and one-point for left lower extremity drift  I have reviewed labs in epic and the results pertinent to this consultation are:  Creatinine 1.23, glucose 183, CBC notable for elevated MCV at 109.4 (possibly in the setting of methotrexate)   Lab Results  Component Value Date   CHOL 156 05/23/2012   HDL 80 05/23/2012   LDLCALC 59 05/23/2012   TRIG 86 05/23/2012   CHOLHDL 2.0 05/23/2012   Lab Results  Component Value Date   HGBA1C 5.7 (H) 05/23/2012   Lab Results  Component Value Date   TSH 6.510 (H) 09/06/2019     I have reviewed the images obtained:  Personally reviewed, chronic microvascular changes without clear acute intracranial process  ECHO 01/07/2021 1. Left ventricular ejection fraction, by estimation, is 40 to 45%. The   left ventricle has mildly decreased function. The left ventricle  demonstrates global hypokinesis. There is mild concentric left ventricular  hypertrophy. Left ventricular diastolic  parameters are indeterminate.  2. Right ventricular systolic function is normal. The right ventricular  size is normal.  3. Left atrial size was severely dilated.  4. The mitral valve is normal in structure. Trivial mitral valve  regurgitation. No evidence of mitral stenosis.  5. The aortic valve is normal in structure. There is mild calcification  of the aortic valve. There is mild thickening of the aortic valve. Aortic  valve regurgitation is trivial. No aortic stenosis is present.  6. The inferior vena cava is normal in size with greater than 50%  respiratory variability, suggesting right atrial pressure of 3 mmHg.   FINDINGS  Left Ventricle: Left ventricular ejection fraction, by estimation, is 40  to 45%. The left ventricle has mildly decreased function. The left  ventricle demonstrates global hypokinesis. The left ventricular internal  cavity size was normal in size. There is  mild concentric left ventricular hypertrophy. Left ventricular diastolic  parameters are indeterminate.   Right Ventricle: The right ventricular size is normal. No increase in  right ventricular wall thickness. Right ventricular systolic function is  normal.   Left Atrium: Left atrial size was severely dilated.   Right Atrium: Right atrial size was normal in size.   Pericardium: There is no evidence of pericardial effusion.   Mitral Valve: The mitral valve is normal in structure. Trivial mitral  valve regurgitation. No evidence of mitral valve stenosis.   Tricuspid Valve: The tricuspid valve is normal in structure. Tricuspid  valve regurgitation is mild . No evidence of tricuspid stenosis.   Aortic Valve: The aortic valve is normal in structure. There is mild  calcification of the aortic valve. There is mild  thickening of the aortic  valve. Aortic valve regurgitation is trivial. No aortic stenosis is  present.   Pulmonic Valve: The pulmonic valve was normal in structure. Pulmonic valve  regurgitation is trivial. No evidence of pulmonic stenosis.  Aorta: The aortic root is normal in size and structure.   Venous: The inferior vena cava is normal in size with greater than 50%  respiratory variability, suggesting right atrial pressure of 3 mmHg.   IAS/Shunts: No atrial level shunt detected by color flow Doppler.  Additional Comments: A device lead is visualized.   Impression: Likely left MCA territory TIA, most likely cardioembolic in the setting of Afib but will complete workup to ensure no other modifiable stroke risk factors  Recommendations: - MRI brain and MRA head/neck if pacer is compatible, otherwise please order repeat HCT on 5/17  - Hold AC for now pending additional CNS imaging - Carotid ultrasound - HbgA1c, lipid panel, TSH, B12 - No need to repeat ECHO given recent stable results and no new cardiac symptoms - Gradual normalization of BP over several days, goal normotension - Close neurochecks per standard TIA protocol - If anti-coagulation continues to be held tomorrow after additional CNS imaging, would recommend starting an antiplatelet agent (aspirin 325 daily) until anticoagulation can be safely resumed  Lesleigh Noe MD-PhD [facility] [phone] Available 7 PM to 7 AM, outside of these hours please call Neurologist on call as listed on Amion.

## 2021-03-10 NOTE — H&P (Signed)
History and Physical    Molly [name] NFA:[id number] DOB: [birth year] DOA: 03/10/2021  PCP: Harlan Stains, MD Patient coming from: Home  Chief Complaint: Slurred speech  HPI: Molly [name] is a 87 y.o. female with medical history significant of permanent A. fib on Eliquis, tachybrady syndrome status post PPM, LBBB, chronic combined CHF/nonischemic cardiomyopathy with LVEF 40 to 45% on echo 01/07/2021, hypertension, hyperlipidemia, CKD stage III, hyperparathyroidism, hypothyroidism, chronic pruritus presented to the ED via EMS for evaluation of strokelike symptoms including slurred speech and word finding difficulty with onset at around 1650 today.  Symptoms had resolved prior to ED arrival.  In the ED, blood pressure elevated with systolic in the 469G to 295M.  Labs showing WBC 7.6, hemoglobin 12.5, MCV 109.4, platelet count 170K.  Sodium 136, potassium 4.2, chloride 105, bicarb 25, BUN 22, creatinine 1.2 (at baseline), glucose 183.  INR 1.4.  Blood ethanol level undetectable.  UDS pending.  UA pending.  Screening COVID and influenza PCR negative. Head CT negative for acute findings.  Neurology recommended admission for TIA work-up with further imaging including brain MRI, MRA head, and carotid ultrasound.  Patient states this afternoon around 4:30 PM she went out to eat and all of a sudden experienced slurred speech and word finding difficulty.  She did not have any weakness in her arm or leg.  No numbness.  No difficulty with her vision.  States symptoms lasted less than an hour.  Denies history of prior stroke.  She has A. fib for which she takes Eliquis.  States 2 weeks ago she had surgery done on her nose to remove cancer.  Denies fevers, cough, or shortness of breath.  Review of Systems:  All systems reviewed and apart from history of presenting illness, are negative.  Past Medical History:  Diagnosis Date  . Aortic insufficiency   . Atrial tachycardia ([place])   . Cancer ([place])    Skin  cancer- basal.  1 mylenoma  . Chronic combined systolic and diastolic CHF (congestive heart failure) ([place])   . CKD (chronic kidney disease), stage III ([place])   . Complication of anesthesia   . DCM (dilated cardiomyopathy) ([place])    EF 40-45% by echo 2017 - nuclear stress test with no ischemia  . GERD (gastroesophageal reflux disease)   . H/O benign essential tremor    on amiodarone resolved off amio  . H/O cardiac radiofrequency ablation    a. AV node ablation in 05/2019.  Marland Kitchen H/O hyperkalemia    Resolved off ACE inhibitors  . High cholesterol   . History of blood transfusion   . Hyperkalemia   . Hyperparathyroidism ([place])   . Hypertension   . Mitral regurgitation    trivial by echo 12/2020  . Persistent atrial fibrillation ([place])   . PONV (postoperative nausea and vomiting)   . PVC's (premature ventricular contractions)   . Tachycardia-bradycardia syndrome [facility])    s/p PPM 02/2013    Past Surgical History:  Procedure Laterality Date  . AMPUTATION Right 11/27/2014   Procedure: RIGHT 2ND AND 3RD TOE AMPUTATION;  Surgeon: Wylene Simmer, MD;  Location: [place];  Service: Orthopedics;  Laterality: Right;  . ATRIAL FIBRILLATION ABLATION N/A 05/29/2019   Procedure: AVN ABLATION;  Surgeon: Evans Lance, MD;  Location: [place] CV LAB;  Service: Cardiovascular;  Laterality: N/A;  . BIV UPGRADE N/A 09/27/2019   Procedure: BIV UPGRADE;  Surgeon: Evans Lance, MD;  Location: [place] [facility] LAB;  Service: Cardiovascular;  Laterality:  N/A;  . BUNIONECTOMY     Right  . CARDIAC CATHETERIZATION  09/25/2000   EF of 50% -- with normal left ventricular size and function  . CARDIOVERSION N/A 12/28/2017   Procedure: CARDIOVERSION;  Surgeon: Dorothy Spark, MD;  Location: [facility] ENDOSCOPY;  Service: Cardiovascular;  Laterality: N/A;  . CARDIOVERSION N/A 02/25/2018   Procedure: CARDIOVERSION;  Surgeon: Skeet Latch, MD;  Location: [place];  Service: Cardiovascular;  Laterality: N/A;  . CESAREAN  SECTION    . CESAREAN SECTION    . EYE SURGERY Bilateral    Cataract  . FOOT SURGERY    . INSERT / REPLACE / REMOVE PACEMAKER  02/2013  . PACEMAKER INSERTION  02/2013  . PERMANENT PACEMAKER INSERTION N/A 03/02/2013   Procedure: PERMANENT PACEMAKER INSERTION;  Surgeon: Evans Lance, MD;  Location: [facility] CATH LAB;  Service: Cardiovascular;  Laterality: N/A;  . SHOULDER ARTHROSCOPY W/ ROTATOR CUFF REPAIR    . SHOULDER SURGERY Right    roto cuff  . TOE AMPUTATION Left    2nd and 3rd, bunion under toes.  . TONSILLECTOMY    . VARICOSE VEIN SURGERY       reports that she has never smoked. She has never used smokeless tobacco. She reports that she does not drink alcohol and does not use drugs.  Allergies  Allergen Reactions  . Ace Inhibitors     hyperkalemia  . Atorvastatin     Other reaction(s): myalgia, Other (See Comments)  . Diltiazem Hcl     Other reaction(s): made patient feel terrible, Other (See Comments)  . Duloxetine Hcl     Other reaction(s): dizziness/nausea, Other (See Comments)  . Losartan     hyperkalemia  . Codeine Nausea Only  . Tramadol Nausea Only    Family History  Problem Relation Age of Onset  . Hypertension Mother   . Hypertension Father   . Breast cancer Sister   . Hypercalcemia Neg Hx     Prior to Admission medications   Medication Sig Start Date End Date Taking? Authorizing Provider  acetaminophen (TYLENOL) 500 MG tablet Take 1,000 mg by mouth 2 (two) times daily. For pain   Yes [provider]  apixaban (ELIQUIS) 5 MG TABS tablet Take 1 tablet (5 mg total) by mouth 2 (two) times daily. 12/11/20  Yes Turner, Eber Hong, MD  atorvastatin (LIPITOR) 10 MG tablet Take 10 mg by mouth at bedtime. 12/13/19  Yes [provider]  dorzolamide-timolol (COSOPT) 22.3-6.8 MG/ML ophthalmic solution Place 1 drop into both eyes 2 (two) times daily. 06/09/20  Yes [provider]  folic acid (FOLVITE) 1 MG tablet Take 1 tablet by mouth as directed. 6x  a week Monday,Wednesday,Thursday,Friday,Saturday and Sunday. 12/24/20  Yes [provider]  furosemide (LASIX) 40 MG tablet Take 1 tablet (40 mg total) by mouth daily. Patient taking differently: Take 20 mg by mouth daily. 01/15/21  Yes Turner, Eber Hong, MD  latanoprost (XALATAN) 0.005 % ophthalmic solution Place 1 drop into both eyes at bedtime.   Yes [provider]  levocetirizine (XYZAL) 5 MG tablet Take 1 tablet by mouth daily as needed for allergies. 12/10/20  Yes [provider]  levothyroxine (SYNTHROID) 25 MCG tablet Take 25 mcg by mouth daily before breakfast.   Yes [provider]  LORazepam (ATIVAN) 0.5 MG tablet TAKE 1 TABLET (0.5 MG TOTAL) BY MOUTH AT BEDTIME. 09/04/19  Yes [place]ique, Betty G, MD  methotrexate (RHEUMATREX) 2.5 MG tablet Take 5 mg by mouth every  Tuesday. 12/24/20  Yes [provider]  omeprazole (PRILOSEC) 20 MG capsule Take 20 mg by mouth daily.   Yes [provider]  predniSONE (DELTASONE) 5 MG tablet Take 5 mg by mouth every morning. 12/24/20  Yes [provider]  rosuvastatin (CRESTOR) 5 MG tablet Take 5 mg by mouth daily. 09/04/20  Yes [provider]  doxycycline (VIBRAMYCIN) 100 MG capsule Take 100 mg by mouth See admin instructions. Bid x 5 days Patient not taking: Reported on 03/10/2021 02/27/21   [provider]  levothyroxine (SYNTHROID) 50 MCG tablet TAKE 0.5 TABLETS (25 MCG TOTAL) BY MOUTH DAILY. Patient not taking: Reported on 03/10/2021 08/30/20   Martinique, Betty G, MD    Physical Exam: Vitals:   03/10/21 1828 03/10/21 1830 03/10/21 1957 03/10/21 2130  BP:  (!) 161/76 (!) 161/70 (!) 156/78  Pulse:  70 70 70  Resp:  17 19 20   Temp:      TempSrc:      SpO2:  100% 97% 97%  Weight: 81.6 kg     Height: 5\' 4"  (1.626 m)       Physical Exam Constitutional:      General: She is not in acute distress. HENT:     Head: Normocephalic and atraumatic.  Eyes:     Extraocular Movements:  Extraocular movements intact.     Pupils: Pupils are equal, round, and reactive to light.  Cardiovascular:     Rate and Rhythm: Normal rate and regular rhythm.     Pulses: Normal pulses.  Pulmonary:     Effort: Pulmonary effort is normal. No respiratory distress.     Breath sounds: Normal breath sounds. No wheezing or rales.  Abdominal:     General: Bowel sounds are normal. There is no distension.     Palpations: Abdomen is soft.     Tenderness: There is no abdominal tenderness.  Musculoskeletal:        General: No swelling or tenderness.     Cervical back: Normal range of motion and neck supple.  Skin:    General: Skin is warm and dry.  Neurological:     General: No focal deficit present.     Mental Status: She is alert and oriented to person, place, and time.     Cranial Nerves: No cranial nerve deficit.     Sensory: No sensory deficit.     Motor: No weakness.     Labs on Admission: I have personally reviewed following labs and imaging studies  CBC: Recent Labs  Lab 03/10/21 1851 03/10/21 1857  WBC 7.6  --   NEUTROABS 5.0  --   HGB 12.5 13.3  HCT 39.7 39.0  MCV 109.4*  --   PLT 170  --    Basic Metabolic Panel: Recent Labs  Lab 03/10/21 1851 03/10/21 1857  NA 136 138  K 4.2 4.2  CL 105 106  CO2 25  --   GLUCOSE 183* 171*  BUN 22 26*  CREATININE 1.23* 1.10*  CALCIUM 10.8*  --    GFR: Estimated Creatinine Clearance: 38.7 mL/min (A) (by C-G formula based on SCr of 1.1 mg/dL (H)). Liver Function Tests: Recent Labs  Lab 03/10/21 1851  AST 26  ALT 12  ALKPHOS 64  BILITOT 0.6  PROT 6.5  ALBUMIN 3.7   No results for input(s): LIPASE, AMYLASE in the last 168 hours. No results for input(s): AMMONIA in the last 168 hours. Coagulation Profile: Recent Labs  Lab 03/10/21 1851  INR  1.4*   Cardiac Enzymes: No results for input(s): CKTOTAL, CKMB, CKMBINDEX, TROPONINI in the last 168 hours. BNP (last 3 results) Recent Labs    06/05/20 1322  12/11/20 1303  PROBNP 877* 844*   HbA1C: No results for input(s): HGBA1C in the last 72 hours. CBG: No results for input(s): GLUCAP in the last 168 hours. Lipid Profile: No results for input(s): CHOL, HDL, LDLCALC, TRIG, CHOLHDL, LDLDIRECT in the last 72 hours. Thyroid Function Tests: No results for input(s): TSH, T4TOTAL, FREET4, T3FREE, THYROIDAB in the last 72 hours. Anemia Panel: No results for input(s): VITAMINB12, FOLATE, FERRITIN, TIBC, IRON, RETICCTPCT in the last 72 hours. Urine analysis:    Component Value Date/Time   COLORURINE YELLOW 03/10/2021 1842   APPEARANCEUR CLEAR 03/10/2021 1842   APPEARANCEUR Turbid (A) 05/17/2017 1357   LABSPEC 1.011 03/10/2021 1842   PHURINE 6.0 03/10/2021 1842   GLUCOSEU NEGATIVE 03/10/2021 1842   HGBUR NEGATIVE 03/10/2021 1842   BILIRUBINUR NEGATIVE 03/10/2021 1842   BILIRUBINUR Negative 05/17/2017 1357   [place] 03/10/2021 1842   PROTEINUR NEGATIVE 03/10/2021 1842   UROBILINOGEN 0.2 06/08/2014 1608   NITRITE NEGATIVE 03/10/2021 1842   LEUKOCYTESUR SMALL (A) 03/10/2021 1842    Radiological Exams on Admission: CT Head Wo Contrast  Result Date: 03/10/2021 CLINICAL DATA:  Slurred speech and dizziness EXAM: CT HEAD WITHOUT CONTRAST TECHNIQUE: Contiguous axial images were obtained from the base of the skull through the vertex without intravenous contrast. COMPARISON:  02/22/2017 FINDINGS: Brain: No evidence of acute infarction, hemorrhage, hydrocephalus, extra-axial collection or mass lesion/mass effect. Chronic atrophic and ischemic changes are noted. Vascular: No hyperdense vessel or unexpected calcification. Skull: Normal. Negative for fracture or focal lesion. Sinuses/Orbits: No acute finding. Other: None. IMPRESSION: Chronic atrophic and ischemic changes. No acute abnormality is noted. Electronically Signed   By: Inez Catalina M.D.   On: 03/10/2021 20:06    EKG: Independently reviewed.  Interpretation limited secondary to paced  rhythm.  Assessment/Plan Principal Problem:   TIA (transient ischemic attack) Active Problems:   Essential hypertension   Persistent atrial fibrillation (HCC)   Anxiety disorder, unspecified   CKD (chronic kidney disease), stage III (HCC)   TIA Likely cardioembolic in the setting of A. fib.  Head CT negative for acute findings.  Neuro exam currently nonfocal. -Neurology consulted, appreciate recommendations -Telemetry monitoring -Brain MRI and MRA head/neck if pacemaker is compatible, otherwise repeat head CT on 5/17 -Carotid Dopplers -Neurology recommending holding anticoagulation for now pending additional neuroimaging. -Neurology recommending not repeating echo since recently done and no new cardiac symptoms -Hemoglobin A1c, fasting lipid panel, TSH, B12 -Frequent neurochecks -PT, OT, speech therapy. -N.p.o. until cleared by bedside swallow evaluation or formal speech evaluation  Permanent A. Fib Has a pacemaker. -Neurology recommending holding anticoagulation for now pending additional neuroimaging.  Chronic combined CHF No signs of volume overload. -Hold Lasix at this time to allow permissive hypertension.  Continue to monitor volume status closely.  Hypertension -Allow permissive hypertension at this time with gradual normalization of blood pressure over several days  CKD stage III Stable.  Creatinine at baseline.  Hypothyroidism -Continue home Synthroid  Macrocytosis without anemia -Continue home folate supplement.  Check B12 and folate levels.  Chronic pruritus -Continue home methotrexate, prednisone, Xyzal  Anxiety -Continue home Ativan prn  GERD -Continue PPI  DVT prophylaxis: SCDs Code Status: Patient wishes to be DNR Family Communication: No family at bedside. Disposition Plan: Status is: Observation  The patient remains OBS appropriate and will d/c before 2  midnights.  Dispo: The patient is from: Home              Anticipated d/c is to: Home               Patient currently is not medically stable to d/c.   Difficult to place patient No  Level of care: Level of care: Telemetry Medical   The medical decision making on this patient was of high complexity and the patient is at high risk for clinical deterioration, therefore this is a level 3 visit.  Shela Leff MD [facility]  If 7PM-7AM, please contact night-coverage [email or web address]  03/10/2021, 10:54 PM

## 2021-03-10 NOTE — ED Provider Notes (Signed)
[facility] EMERGENCY DEPARTMENT Provider Note   CSN: [id number] Arrival date & time: 03/10/21  1821     History No chief complaint on file.   Molly [name] is a 87 y.o. female.  HPI   Patient with history of HTN and AF on elliquis presents for speech changes. At 4:30 this afternoon she was getting Freddy[employer] with her husband. Reports she was speaking and he was unable to understand what she was saying. She reports "feeling funny in the head" and not being able to remember words or certain memories. This episode lasted for around 30 minutes. She has no history of MI/stroke/blood clots in the past. She denies any vision changes, nausea, vomiting. Denies any unilateral weakness. States symptoms lasted for minutes.   She has history of skin cancer removal to the right cheek. Also has history of fall 6 weeks ago and has lasting MSK weakness to the legs bilaterally.   Past Medical History:  Diagnosis Date  . Aortic insufficiency   . Atrial tachycardia (HCC)   . Cancer (HCC)    Skin cancer- basal.  1 mylenoma  . Chronic combined systolic and diastolic CHF (congestive heart failure) (HCC)   . CKD (chronic kidney disease), stage III (HCC)   . Complication of anesthesia   . DCM (dilated cardiomyopathy) (HCC)    EF 40-45% by echo 2017 - nuclear stress test with no ischemia  . GERD (gastroesophageal reflux disease)   . H/O benign essential tremor    on amiodarone resolved off amio  . H/O cardiac radiofrequency ablation    a. AV node ablation in 05/2019.  Marland Kitchen H/O hyperkalemia    Resolved off ACE inhibitors  . High cholesterol   . History of blood transfusion   . Hyperkalemia   . Hyperparathyroidism (HCC)   . Hypertension   . Mitral regurgitation    trivial by echo 12/2020  . Persistent atrial fibrillation (HCC)   . PONV (postoperative nausea and vomiting)   . PVC's (premature ventricular contractions)   . Tachycardia-bradycardia syndrome [facility])    s/p PPM 02/2013     Patient Active Problem List   Diagnosis Date Noted  . Hyperparathyroidism, primary (HCC) 12/04/2020  . Complete heart block (HCC) 09/27/2019  . Atrial tachycardia (HCC) 09/13/2019  . Iron deficiency anemia 08/14/2019  . Long term (current) use of anticoagulants 07/12/2019  . Acute on chronic combined systolic and diastolic CHF (congestive heart failure) (HCC) 05/25/2019  . CKD (chronic kidney disease), stage III (HCC) 04/24/2019  . Back pain, chronic 09/19/2018  . Anxiety disorder, unspecified 09/19/2018  . Visit for monitoring Tikosyn therapy 02/07/2018  . Chronic combined systolic and diastolic heart failure (HCC) 12/28/2017  . Hypotension 05/17/2017  . SOB (shortness of breath)   . DCM (dilated cardiomyopathy) (HCC) 11/10/2016  . Aortic insufficiency   . Mitral regurgitation 11/06/2015  . Tachycardia-bradycardia syndrome (HCC)   . Pacemaker 04/18/2013  . Claw toe, acquired 06/28/2012  . Pain in joint involving ankle and foot 06/28/2012  . PVC (premature ventricular contraction) 06/07/2012  . Fatigue 06/07/2012  . Symptomatic bradycardia 05/23/2012  . Hyperlipidemia 10/29/2009  . PULMONARY FUNCTION TESTS, ABNORMAL 10/29/2009  . Essential hypertension 10/28/2009  . Persistent atrial fibrillation (HCC) 10/28/2009    Past Surgical History:  Procedure Laterality Date  . AMPUTATION Right 11/27/2014   Procedure: RIGHT 2ND AND 3RD TOE AMPUTATION;  Surgeon: Toni Arthurs, MD;  Location: [facility] OR;  Service: Orthopedics;  Laterality: Right;  . ATRIAL FIBRILLATION ABLATION N/A  05/29/2019   Procedure: AVN ABLATION;  Surgeon: Evans Lance, MD;  Location: [facility] CV LAB;  Service: Cardiovascular;  Laterality: N/A;  . BIV UPGRADE N/A 09/27/2019   Procedure: BIV UPGRADE;  Surgeon: Evans Lance, MD;  Location: [facility] CV LAB;  Service: Cardiovascular;  Laterality: N/A;  . BUNIONECTOMY     Right  . CARDIAC CATHETERIZATION  09/25/2000   EF of 50% -- with normal left ventricular  size and function  . CARDIOVERSION N/A 12/28/2017   Procedure: CARDIOVERSION;  Surgeon: Dorothy Spark, MD;  Location: [facility] ENDOSCOPY;  Service: Cardiovascular;  Laterality: N/A;  . CARDIOVERSION N/A 02/25/2018   Procedure: CARDIOVERSION;  Surgeon: Skeet Latch, MD;  Location: [place];  Service: Cardiovascular;  Laterality: N/A;  . CESAREAN SECTION    . CESAREAN SECTION    . EYE SURGERY Bilateral    Cataract  . FOOT SURGERY    . INSERT / REPLACE / REMOVE PACEMAKER  02/2013  . PACEMAKER INSERTION  02/2013  . PERMANENT PACEMAKER INSERTION N/A 03/02/2013   Procedure: PERMANENT PACEMAKER INSERTION;  Surgeon: Evans Lance, MD;  Location: [facility] CATH LAB;  Service: Cardiovascular;  Laterality: N/A;  . SHOULDER ARTHROSCOPY W/ ROTATOR CUFF REPAIR    . SHOULDER SURGERY Right    roto cuff  . TOE AMPUTATION Left    2nd and 3rd, bunion under toes.  . TONSILLECTOMY    . VARICOSE VEIN SURGERY       OB History   No obstetric history on file.     Family History  Problem Relation Age of Onset  . Hypertension Mother   . Hypertension Father   . Breast cancer Sister   . Hypercalcemia Neg Hx     Social History   Tobacco Use  . Smoking status: Never Smoker  . Smokeless tobacco: Never Used  Vaping Use  . Vaping Use: Never used  Substance Use Topics  . Alcohol use: No  . Drug use: No    Home Medications Prior to Admission medications   Medication Sig Start Date End Date Taking? Authorizing Provider  acetaminophen (TYLENOL) 500 MG tablet Take 1,000 mg by mouth 2 (two) times daily. For pain    [provider]  apixaban (ELIQUIS) 5 MG TABS tablet Take 1 tablet (5 mg total) by mouth 2 (two) times daily. 12/11/20   Sueanne Margarita, MD  atorvastatin (LIPITOR) 10 MG tablet Take 10 mg by mouth daily. 12/13/19   [provider]  dorzolamide (TRUSOPT) 2 % ophthalmic solution Place 1 drop into the right eye 2 (two) times daily. 06/22/12   [provider]   dorzolamide-timolol (COSOPT) 22.3-6.8 MG/ML ophthalmic solution Place 1 drop into both eyes 2 (two) times daily. 06/09/20   [provider]  folic acid (FOLVITE) 1 MG tablet Take 1 tablet by mouth as directed. 6x a week 12/24/20   [provider]  furosemide (LASIX) 40 MG tablet Take 1 tablet (40 mg total) by mouth daily. 01/15/21   Sueanne Margarita, MD  levocetirizine (XYZAL) 5 MG tablet Take 1 tablet by mouth daily as needed. 12/10/20   [provider]  levothyroxine (SYNTHROID) 50 MCG tablet TAKE 0.5 TABLETS (25 MCG TOTAL) BY MOUTH DAILY. 08/30/20   Martinique, Betty G, MD  LORazepam (ATIVAN) 0.5 MG tablet TAKE 1 TABLET (0.5 MG TOTAL) BY MOUTH AT BEDTIME. 09/04/19   [place], Betty G, MD  methotrexate (RHEUMATREX) 2.5 MG tablet Take 5 mg by mouth once a week. 12/24/20  [provider]  Multiple Vitamin (MULTIVITAMIN WITH MINERALS) TABS Take 1 tablet by mouth daily.    [provider]  omeprazole (PRILOSEC) 20 MG capsule Take 20 mg by mouth every evening.     [provider]  predniSONE (DELTASONE) 5 MG tablet Take 5 mg by mouth every morning. 12/24/20   [provider]  rosuvastatin (CRESTOR) 5 MG tablet Take 5 mg by mouth daily. 09/04/20   [provider]    Allergies    Ace inhibitors, Losartan, Codeine, and Tramadol  Review of Systems   Review of Systems  Constitutional: Negative for chills and fever.  HENT: Negative for ear pain and sore throat.   Eyes: Negative for pain and visual disturbance.  Respiratory: Negative for cough and shortness of breath.   Cardiovascular: Negative for chest pain and palpitations.  Gastrointestinal: Negative for abdominal pain and vomiting.  Genitourinary: Negative for dysuria and hematuria.  Musculoskeletal: Negative for arthralgias and back pain.  Skin: Negative for color change and rash.  Neurological: Positive for dizziness, speech difficulty and light-headedness. Negative for tremors,  seizures, syncope, facial asymmetry and weakness.  All other systems reviewed and are negative.   Physical Exam Updated Vital Signs BP (!) 173/80   Pulse 70   Temp 97.6 F (36.4 C) (Oral)   Resp (!) 22   Ht 5\' 4"  (1.626 m)   Wt 81.6 kg   SpO2 99%   BMI 30.90 kg/m   Physical Exam Vitals and nursing note reviewed. Exam conducted with a chaperone present.  Constitutional:      Appearance: Normal appearance.     Comments: Patient resting comfortably. She has notable bruising and a bandage to the right side of her face.   HENT:     Head: Normocephalic and atraumatic.  Eyes:     General: No scleral icterus.       Right eye: No discharge.        Left eye: No discharge.     Extraocular Movements: Extraocular movements intact.     Pupils: Pupils are equal, round, and reactive to light.  Cardiovascular:     Rate and Rhythm: Normal rate and regular rhythm.     Pulses: Normal pulses.     Heart sounds: Normal heart sounds. No murmur heard. No friction rub. No gallop.      Comments: Pacemaker palpable Pulmonary:     Effort: Pulmonary effort is normal. No respiratory distress.     Breath sounds: Normal breath sounds.  Abdominal:     General: Abdomen is flat. Bowel sounds are normal. There is no distension.     Palpations: Abdomen is soft.     Tenderness: There is no abdominal tenderness.  Musculoskeletal:        General: Signs of injury present.     Comments: Bilateral leg weakness. States this is ongoing since fall.  Unable to assess motor strength.   Skin:    General: Skin is warm and dry.     Coloration: Skin is not jaundiced.  Neurological:     General: No focal deficit present.     Mental Status: She is alert and oriented to person, place, and time. Mental status is at baseline.     Cranial Nerves: Facial asymmetry present. No cranial nerve deficit or dysarthria.     Sensory: No sensory deficit.     Motor: No weakness or pronator drift.     Coordination: Romberg sign  negative. Coordination normal.  Comments: Speaking in full sentences. Answering questions appropriately. No aphasia on exam. Speech clear, no slurring. Mild facial assymmetry to the right lip but patient states this is due to her recent skin cancer excision to the right cheek and is not an acute finding.      ED Results / Procedures / Treatments   Labs (all labs ordered are listed, but only abnormal results are displayed) Labs Reviewed  PROTIME-INR - Abnormal; Notable for the following components:      Result Value   Prothrombin Time 16.9 (*)    INR 1.4 (*)    All other components within normal limits  CBC - Abnormal; Notable for the following components:   RBC 3.63 (*)    MCV 109.4 (*)    MCH 34.4 (*)    All other components within normal limits  I-STAT CHEM 8, ED - Abnormal; Notable for the following components:   BUN 26 (*)    Creatinine, Ser 1.10 (*)    Glucose, Bld 171 (*)    Calcium, Ion 1.42 (*)    All other components within normal limits  RESP PANEL BY RT-PCR (FLU A&B, COVID) ARPGX2  APTT  DIFFERENTIAL  ETHANOL  COMPREHENSIVE METABOLIC PANEL  RAPID URINE DRUG SCREEN, HOSP PERFORMED  URINALYSIS, ROUTINE W REFLEX MICROSCOPIC    EKG EKG Interpretation  Date/Time:  Monday Mar 10 2021 18:25:23 EDT Ventricular Rate:  70 PR Interval:  68 QRS Duration: 167 QT Interval:  432 QTC Calculation: 467 R Axis:   -88 Text Interpretation: Ventricular-paced complexes No further analysis attempted due to paced rhythm Confirmed by Lavenia Atlas [phone]) on 03/10/2021 6:28:59 PM   Radiology No results found.  Procedures Procedures   Medications Ordered in ED Medications - No data to display  ED Course  I have reviewed the triage vital signs and the nursing notes.  Pertinent labs & imaging results that were available during my care of the patient were reviewed by me and considered in my medical decision making (see chart for details).    MDM Rules/Calculators/A&P                          History is concerning for TIA. Initiated stroke workup without stroke protocol because patient is at her baseline. No focal deficits on exam.   ABCD2 score for TIA is 4. Still concerned enough with history that TIA occurred.   Consulted with Neurology and they requested MRA and carotid ultrasound. Patient to be admitted for TIA workup.   Final Clinical Impression(s) / ED Diagnoses Final diagnoses:  None    Rx / DC Orders ED Discharge Orders    None       Sherrill Raring, PA-C 03/10/21 2054    Lorelle Gibbs, DO 03/11/21 2337

## 2021-03-10 NOTE — ED Triage Notes (Signed)
Pt bib by [facility] from home due to stroke like symptoms. Pt went to get food around 1650 today when her husband noticed that her speech was off. She couldn't get her words out and they were slurred. She was also having intermittent dizziness, which is still on going. Husband took pt back home and called EMS. On EMS arrival around 1740 pt was at baseline but mildly confused. PT A&Ox4 on triage.  Recent surgery on nose for cancer removal Pacemaker on Eliquis. Fall 8 weeks ago. Was evaluated at that time.  Vitals: BP: 184/110 HR: 74 O2: 96% CBG: 174 RR: 18

## 2021-03-10 NOTE — ED Notes (Signed)
Pt up to bathroom utilizing walker. Pt is one person assist. Steady gait noted, pt needs assistance standing from bed and getting on her feet.

## 2021-03-11 ENCOUNTER — Observation Stay (HOSPITAL_BASED_OUTPATIENT_CLINIC_OR_DEPARTMENT_OTHER): Payer: Medicare HMO

## 2021-03-11 ENCOUNTER — Encounter (HOSPITAL_COMMUNITY): Payer: Self-pay | Admitting: Internal Medicine

## 2021-03-11 ENCOUNTER — Observation Stay (HOSPITAL_COMMUNITY): Payer: Medicare HMO

## 2021-03-11 DIAGNOSIS — I4819 Other persistent atrial fibrillation: Secondary | ICD-10-CM

## 2021-03-11 DIAGNOSIS — E7800 Pure hypercholesterolemia, unspecified: Secondary | ICD-10-CM | POA: Diagnosis not present

## 2021-03-11 DIAGNOSIS — F419 Anxiety disorder, unspecified: Secondary | ICD-10-CM

## 2021-03-11 DIAGNOSIS — N1831 Chronic kidney disease, stage 3a: Secondary | ICD-10-CM

## 2021-03-11 DIAGNOSIS — G459 Transient cerebral ischemic attack, unspecified: Secondary | ICD-10-CM

## 2021-03-11 DIAGNOSIS — R69 Illness, unspecified: Secondary | ICD-10-CM | POA: Diagnosis not present

## 2021-03-11 DIAGNOSIS — I10 Essential (primary) hypertension: Secondary | ICD-10-CM | POA: Diagnosis not present

## 2021-03-11 DIAGNOSIS — R531 Weakness: Secondary | ICD-10-CM | POA: Diagnosis not present

## 2021-03-11 LAB — FOLATE: Folate: 31 ng/mL (ref >5.9)

## 2021-03-11 LAB — LIPID PANEL
Cholesterol: 219 mg/dL — ABNORMAL HIGH (ref 0–200)
HDL: 67 mg/dL (ref >40)
LDL Cholesterol: 117 mg/dL — ABNORMAL HIGH (ref 0–99)
Total CHOL/HDL Ratio: 3.3 RATIO
Triglycerides: 176 mg/dL — ABNORMAL HIGH (ref <150)
VLDL: 35 mg/dL (ref 0–40)

## 2021-03-11 LAB — HEMOGLOBIN A1C
Hgb A1c MFr Bld: 5.3 % (ref 4.8–5.6)
Mean Plasma Glucose: 105.41 mg/dL

## 2021-03-11 LAB — TSH: TSH: 3.678 u[IU]/mL (ref 0.350–4.500)

## 2021-03-11 LAB — VITAMIN B12: Vitamin B-12: 383 pg/mL (ref 180–914)

## 2021-03-11 MED ORDER — APIXABAN 5 MG PO TABS
5.0000 mg | ORAL_TABLET | Freq: Two times a day (BID) | ORAL | Status: DC
  Administered 2021-03-11 – 2021-03-12 (×2): 5 mg via ORAL
  Filled 2021-03-11 (×2): qty 1

## 2021-03-11 MED ORDER — ROSUVASTATIN CALCIUM 5 MG PO TABS
10.0000 mg | ORAL_TABLET | Freq: Every day | ORAL | Status: DC
  Administered 2021-03-11 – 2021-03-12 (×2): 10 mg via ORAL
  Filled 2021-03-11 (×2): qty 2

## 2021-03-11 NOTE — Progress Notes (Signed)
Carotid duplex bilateral and TCD study completed.   Please see CV Proc for preliminary results.   Liddie Chichester, RDMS, RVT  

## 2021-03-11 NOTE — Evaluation (Signed)
Physical Therapy Evaluation Patient Details Name: Molly [name] MRN: [id number] DOB: 03-12-1933 Today's Date: 03/11/2021   History of Present Illness  Molly [name] is a 87 y.o. female who presented to the ED via EMS for evaluation of strokelike symptoms including slurred speech and word finding difficulty; noteworthy also is that she fell about 6-8 weeks ago, and is still recovering from that fall,  recieving HHPT, very painful L knee and lower leg still;  with medical history significant of permanent A. fib on Eliquis, tachybrady syndrome status post PPM, LBBB, chronic combined CHF/nonischemic cardiomyopathy with LVEF 40 to 45% on echo 01/07/2021, hypertension, hyperlipidemia, CKD stage III, hyperparathyroidism, hypothyroidism, chronic pruritus  Clinical Impression   Pt admitted with above diagnosis. Lives at home with husband in a two-story home (can stay on main level) with 1 step to enter; Typically modified independent with Rollator RW, does not drive; REcent fall (within the past month and a half or so), and pt is still recovering -- reports more difficulty and pain with transfers and ambulation since fall (following with Dr. Lyla Glassing); Presents to PT with LLE and low back pain with mobility, and functional dependencies; Able to manage household distances with RW and slow pace; Dependent on momentum and weight shift (more than muscle strength) for sit<>stand transfers; Pt currently with functional limitations due to the deficits listed below (see PT Problem List). Pt will benefit from skilled PT to increase their independence and safety with mobility to allow discharge to the venue listed below.       Follow Up Recommendations Home health PT;Supervision/Assistance - 24 hour    Equipment Recommendations  3in1 (PT)    Recommendations for Other Services       Precautions / Restrictions Precautions Precautions: Fall Precaution Comments: Recent fall, still recovering; Painful L knee  and lower leg, back pain as well      Mobility  Bed Mobility Overal bed mobility: Needs Assistance Bed Mobility: Supine to Sit;Sit to Supine     Supine to sit: Supervision Sit to supine: Supervision   General bed mobility comments: incr time    Transfers Overall transfer level: Needs assistance Equipment used: Rolling walker (2 wheeled) Transfers: Sit to/from Stand Sit to Stand: Min guard         General transfer comment: Dependent on anterior weight shift/momentum to initiate rise, weak LEs, and pushes up with UEs on RW to get to fully upright  Ambulation/Gait Ambulation/Gait assistance: Min guard (with and without physical contact) Gait Distance (Feet): 80 Feet Assistive device: Rolling walker (2 wheeled) Gait Pattern/deviations: Decreased step length - right;Decreased step length - left;Trunk flexed     General Gait Details: Heavy dependence on UE support; very fatigued by the end of the walk  Stairs            Wheelchair Mobility    Modified Rankin (Stroke Patients Only)       Balance Overall balance assessment: Needs assistance   Sitting balance-Leahy Scale: Fair       Standing balance-Leahy Scale: Poor                               Pertinent Vitals/Pain Pain Assessment: Faces Faces Pain Scale: Hurts little more Pain Location: L knee and lower leg as well as back Pain Descriptors / Indicators: Aching;Discomfort Pain Intervention(s): Monitored during session;Ice applied;Other (comment) (Tells me she had been using ice on her L knee at home since  fall)    Home Living Family/patient expects to be discharged to:: Private residence Living Arrangements: Spouse/significant other Available Help at Discharge: Family;Available 24 hours/day (at or near 24 hours) Type of Home: House Home Access: Stairs to enter Entrance Stairs-Rails: None Entrance Stairs-Number of Steps: 1 Home Layout: Two level;Able to live on main level with  bedroom/bathroom Home Equipment: Walker - 4 wheels;Toilet riser;Shower seat;Bedside commode Additional Comments: Unsure if pt has a BSC dedicated to her shower, or a shower seat    Prior Function Level of Independence: Needs assistance (has needed assistance since her recent fall)   Gait / Transfers Assistance Needed: With Rollator; more difficulty with amb since fall, more painful  ADL's / Homemaking Assistance Needed: Reports difficulty rising from commode since recent fall        Hand Dominance        Extremity/Trunk Assessment   Upper Extremity Assessment Upper Extremity Assessment: Defer to OT evaluation    Lower Extremity Assessment Lower Extremity Assessment: Generalized weakness;LLE deficits/detail LLE Deficits / Details: knee and lower leg eryththematous and swollen; very tender to palpation throughout lower leg    Cervical / Trunk Assessment Cervical / Trunk Assessment: Other exceptions Cervical / Trunk Exceptions: Reports chronic bakc pain  Communication   Communication: No difficulties  Cognition Arousal/Alertness: Awake/alert Behavior During Therapy: WFL for tasks assessed/performed Overall Cognitive Status: Within Functional Limits for tasks assessed                                        General Comments General comments (skin integrity, edema, etc.): Noted Swelling R side of face adn dressing on R aspect of her nose; tells me she recently had a cancerous lesion removed from her face    Exercises     Assessment/Plan    PT Assessment Patient needs continued PT services  PT Problem List Decreased strength;Decreased range of motion;Decreased activity tolerance;Decreased balance;Decreased mobility;Decreased coordination;Decreased knowledge of use of DME;Pain       PT Treatment Interventions DME instruction;Gait training;Stair training;Functional mobility training;Therapeutic activities;Therapeutic exercise;Balance training;Neuromuscular  re-education;Patient/family education    PT Goals (Current goals can be found in the Care Plan section)  Acute Rehab PT Goals Patient Stated Goal: To get home asap; granddaughter adn great granddaughter are visitting this week PT Goal Formulation: With patient Time For Goal Achievement: 03/18/21 Potential to Achieve Goals: Good    Frequency Min 3X/week   Barriers to discharge        Co-evaluation               AM-PAC PT "6 Clicks" Mobility  Outcome Measure Help needed turning from your back to your side while in a flat bed without using bedrails?: None Help needed moving from lying on your back to sitting on the side of a flat bed without using bedrails?: None Help needed moving to and from a bed to a chair (including a wheelchair)?: A Little Help needed standing up from a chair using your arms (e.g., wheelchair or bedside chair)?: A Little Help needed to walk in hospital room?: A Little Help needed climbing 3-5 steps with a railing? : A Lot 6 Click Score: 19    End of Session Equipment Utilized During Treatment: Gait belt Activity Tolerance: Patient tolerated treatment well;Other (comment) (Tired at end of walk) Patient left: in bed;with call bell/phone within reach;with bed alarm set;with family/visitor present Nurse Communication: Mobility status  PT Visit Diagnosis: Unsteadiness on feet (R26.81);Other abnormalities of gait and mobility (R26.89);History of falling (Z91.81);Pain Pain - Right/Left: Left Pain - part of body: Leg (and low back)    Time: 0350-0938 PT Time Calculation (min) (ACUTE ONLY): 37 min   Charges:   PT Evaluation $PT Eval Moderate Complexity: 1 Mod PT Treatments $Gait Training: 8-22 mins        Roney Marion, PT  Acute Rehabilitation Services Pager [phone] Office [place] 03/11/2021, 10:05 AM

## 2021-03-11 NOTE — Progress Notes (Incomplete)
Principal problem TIA - Patient admitted to the hospital with slurred speech and concern for a CVA. Neurology consulted and followed patient while hospitalized. She could not obtain an MRI due to her pacemaker and underwent serial CT scans which showed ***. PT evaluated patient recommending HHPT which was arranged with CM assistance. Did not underwent a repeat 2D echo given most recent one 2 months ago showing EF 40-45%. A1C was 5.3, LDL 117, continue statin. Carotid US with ***  Active problems Permanent A. Fib -Has a pacemaker. Continue home medications including Eliquis.   Chronic combined CHF -No signs of volume overload. Resume home medications Hypertension -resume home medications CKD stage IIIa -Stable.  Creatinine at baseline. Hypothyroidism -Continue home Synthroid. TSH normal at 3.6 Macrocytosis without anemia -Continue home folate supplement Chronic pruritus -Continue home methotrexate, prednisone, Xyzal Anxiety -Continue home medications

## 2021-03-11 NOTE — Evaluation (Signed)
Occupational Therapy Evaluation Patient Details Name: Molly [name] MRN: [id number] DOB: 06/21/1933 Today's Date: 03/11/2021    History of Present Illness Molly [name] is a 87 y.o. female who presented to the ED via EMS for evaluation of strokelike symptoms including slurred speech and word finding difficulty; noteworthy also is that she fell about 6-8 weeks ago, and is still recovering from that fall,  recieving HHPT, very painful L knee and lower leg still;  with medical history significant of permanent A. fib on Eliquis, tachybrady syndrome status post PPM, LBBB, chronic combined CHF/nonischemic cardiomyopathy with LVEF 40 to 45% on echo 01/07/2021, hypertension, hyperlipidemia, CKD stage III, hyperparathyroidism, hypothyroidism, chronic pruritus   Clinical Impression   PT admitted with word finding deficits. Pt currently with functional limitiations due to the deficits listed below (see OT problem list). Pt demonstrates balance deficits. Spouse reports deficits with bathroom transfer. 3n1 in room. Attempted to obtain a gait belt for spouse and was unable to locate in 3 clean closets  So asked unit to obtain.  Pt will benefit from skilled OT to increase their independence and safety with adls and balance to allow discharge [place].     Follow Up Recommendations  Home health OT    Equipment Recommendations  Other (comment);3 in 1 bedside commode (dme in room)    Recommendations for Other Services       Precautions / Restrictions Precautions Precautions: Fall Precaution Comments: Recent fall, still recovering; Painful L knee and lower leg, back pain as well      Mobility Bed Mobility Overal bed mobility: Needs Assistance Bed Mobility: Supine to Sit;Sit to Supine     Supine to sit: Supervision Sit to supine: Supervision   General bed mobility comments: incr time    Transfers Overall transfer level: Needs assistance Equipment used: Rolling walker (2 wheeled) Transfers:  Sit to/from Stand Sit to Stand: Min guard         General transfer comment: Dependent on anterior weight shift/momentum to initiate rise, weak LEs, and pushes up with UEs on RW to get to fully upright    Balance Overall balance assessment: Needs assistance   Sitting balance-Leahy Scale: Fair       Standing balance-Leahy Scale: Poor                             ADL either performed or assessed with clinical judgement   ADL Overall ADL's : Needs assistance/impaired Eating/Feeding: Modified independent   Grooming: Oral care;Supervision/safety                 Lower Body Dressing Details (indicate cue type and reason): requires (A) to push foot into her slide on shoes Toilet Transfer: BSC;RW;Minimal assistance (stedy simulated .)           Functional mobility during ADLs: Min guard;Rolling walker General ADL Comments: spouse present and verbalize need for (A) in the home for bathroom transfers     Vision         Perception     Praxis      Pertinent Vitals/Pain Pain Assessment: Faces Faces Pain Scale: Hurts little more Pain Location: L knee and lower leg as well as back Pain Descriptors / Indicators: Aching;Discomfort     Hand Dominance     Extremity/Trunk Assessment Upper Extremity Assessment Upper Extremity Assessment: Generalized weakness   Lower Extremity Assessment LLE Deficits / Details: knee and lower leg eryththematous and swollen; very tender  to palpation throughout lower leg   Cervical / Trunk Assessment Cervical / Trunk Assessment: Other exceptions Cervical / Trunk Exceptions: Reports chronic bakc pain   Communication Communication Communication: No difficulties   Cognition Arousal/Alertness: Awake/alert Behavior During Therapy: WFL for tasks assessed/performed Overall Cognitive Status: Within Functional Limits for tasks assessed                                     General Comments  R side of face with  edema pt reports pending home health that she has not recieved any yet    Exercises     Shoulder Instructions      Home Living Family/patient expects to be discharged to:: Private residence Living Arrangements: Spouse/significant other Available Help at Discharge: Family;Available 24 hours/day (at or near 24 hours) Type of Home: House Home Access: Stairs to enter [employer] of Steps: 1 Entrance Stairs-Rails: None Home Layout: Two level;Able to live on main level with bedroom/bathroom     Bathroom Shower/Tub: Hospital doctor Toilet: Handicapped height     Home Equipment: Environmental consultant - 4 wheels;Toilet riser;Shower seat;Bedside commode   Additional Comments: 3n1 delivered to the room      Prior Functioning/Environment Level of Independence: Needs assistance (has needed assistance since her recent fall)  Gait / Transfers Assistance Needed: With Rollator; more difficulty with amb since fall, more painful ADL's / Homemaking Assistance Needed: Reports difficulty rising from commode since recent fall            OT Problem List: Impaired balance (sitting and/or standing);Decreased activity tolerance;Decreased safety awareness;Decreased knowledge of use of DME or AE;Decreased knowledge of precautions;Obesity      OT Treatment/Interventions: Self-care/ADL training;Therapeutic exercise;Energy conservation;DME and/or AE instruction;Therapeutic activities;Cognitive remediation/compensation;Patient/family education;Balance training    OT Goals(Current goals can be found in the care plan section) Acute Rehab OT Goals Patient Stated Goal: To get home asap; granddaughter adn great granddaughter are visitting this week OT Goal Formulation: With patient Time For Goal Achievement: 03/25/21 Potential to Achieve Goals: Good  OT Frequency: Min 2X/week   Barriers to D/C:            Co-evaluation              AM-PAC OT "6 Clicks" Daily Activity     Outcome  Measure Help from another person eating meals?: None Help from another person taking care of personal grooming?: None Help from another person toileting, which includes using toliet, bedpan, or urinal?: A Little Help from another person bathing (including washing, rinsing, drying)?: A Lot Help from another person to put on and taking off regular upper body clothing?: A Little Help from another person to put on and taking off regular lower body clothing?: A Lot 6 Click Score: 18   End of Session Equipment Utilized During Treatment: Gait belt;Rolling walker Nurse Communication: Mobility status;Precautions  Activity Tolerance: Patient tolerated treatment well Patient left: in chair;with call bell/phone within reach;with chair alarm set  OT Visit Diagnosis: Unsteadiness on feet (R26.81);Muscle weakness (generalized) (M62.81)                Time: 5621-3086 OT Time Calculation (min): 32 min Charges:  OT General Charges $OT Visit: 1 Visit OT Evaluation $OT Eval Moderate Complexity: 1 Mod   Brynn, OTR/L  Acute Rehabilitation Services Pager: [phone] Office: [phone] .   Jeri Modena 03/11/2021, 4:17 PM

## 2021-03-11 NOTE — Progress Notes (Signed)
PROGRESS NOTE  Molly [name] [id number] DOB: 03-24-1933 DOA: 03/10/2021 PCP: Harlan Stains, MD   LOS: 0 days   Brief Narrative / Interim history: Molly [name] is a 87 y.o. female with medical history significant of permanent A. fib on Eliquis, tachybrady syndrome status post PPM, LBBB, chronic combined CHF/nonischemic cardiomyopathy, HTN, HLD, CKD3b, hypothyroidism, chronic pruritus presented to the ED via EMS for evaluation of strokelike symptoms including slurred speech and word finding difficulty in the afternoon of 5/16.  Symptoms lasted for less than an hour  Subjective / 24h Interval events: She is doing well this morning, no chest pain, no shortness of breath.  Assessment & Plan: Principal problem TIA - Patient admitted to the hospital with slurred speech and concern for a CVA. Neurology consulted and followed patient while hospitalized. She could not obtain an MRI due to her pacemaker.  CT scans on admission without acute abnormalities.  PT evaluated patient recommending HHPT which was arranged with CM assistance. Did not underwent a repeat 2D echo given most recent one 2 months ago showing EF 40-45%. A1C was 5.3, LDL 117, continue statin.  -Carotid ultrasound and transcranial Doppler pending -Discussed with Dr.  Erlinda Hong, recommends repeat CT scan 5/18 a.m.  Active problems Permanent A. Fib -Has a pacemaker. Continue home medications but hold Eliquis pending repeat CT scan tomorrow morning  Chronic combined CHF -No signs of volume overload.   Hypertension -allow permissive hypertension  CKD stage IIIa -Stable. Creatinine at baseline.  Hypothyroidism -Continue home Synthroid. TSH normal at 3.6  Macrocytosis without anemia -hemoglobin stable  Chronic pruritus -Continue home methotrexate, prednisone, Xyzal  Anxiety -Continue home medications  Scheduled Meds: .  stroke: mapping our early stages of recovery book   Does not apply Once  . dorzolamide-timolol  1 drop  Both Eyes BID  . [START ON [id number] folic acid  1 mg Oral Once per day on Sun Mon Wed Thu Fri Sat  . latanoprost  1 drop Both Eyes QHS  . levothyroxine  25 mcg Oral QAC breakfast  . methotrexate  5 mg Oral Q Tue  . pantoprazole  40 mg Oral Daily  . predniSONE  5 mg Oral q morning   Continuous Infusions: PRN Meds:.acetaminophen **OR** acetaminophen (TYLENOL) oral liquid 160 mg/5 mL **OR** acetaminophen, loratadine, LORazepam  Diet Orders (From admission, onward)    Start     Ordered   03/11/21 0524  Diet Heart Room service appropriate? Yes; Fluid consistency: Thin  Diet effective now       Question Answer Comment  Room service appropriate? Yes   Fluid consistency: Thin      03/11/21 0523          DVT prophylaxis: Place and maintain sequential compression device Start: 03/10/21 2303     Code Status: DNR  Family Communication: Husband at bedside  Status is: Observation  The patient will require care spanning > 2 midnights and should be moved to inpatient because: Ongoing diagnostic testing needed not appropriate for outpatient work up  Dispo: The patient is from: Home              Anticipated d/c is to: Home              Patient currently is not medically stable to d/c.   Difficult to place patient No  Level of care: Telemetry Medical  Consultants:  Neurology  Procedures:  None  Microbiology  none  Antimicrobials: none    Objective: Vitals:   03/11/21  0234 03/11/21 0434 03/11/21 0633 03/11/21 0809  BP: (!) 153/69 (!) 149/67 (!) 149/70 138/63  Pulse: 70 69 69 70  Resp: 20 20 17 18   Temp: 97.9 F (36.6 C) 98.2 F (36.8 C) 98.7 F (37.1 C) 97.6 F (36.4 C)  TempSrc: Oral Oral Oral Oral  SpO2: 97% 97% 95% 94%  Weight:      Height:       No intake or output data in the 24 hours ending 03/11/21 1152 Filed Weights   03/10/21 1828  Weight: 81.6 kg    Examination:  Constitutional: NAD Eyes: no scleral icterus ENMT: Mucous membranes are moist.   Neck: normal, supple Respiratory: clear to auscultation bilaterally, no wheezing, no crackles. Normal respiratory effort. No accessory muscle use.  Cardiovascular: Irregular, no peripheral edema Abdomen: non distended, no tenderness. Bowel sounds positive.  Musculoskeletal: no clubbing / cyanosis.  Skin: no rashes Neurologic: Nonfocal   Data Reviewed: I have independently reviewed following labs and imaging studies   CBC: Recent Labs  Lab 03/10/21 1851 03/10/21 1857  WBC 7.6  --   NEUTROABS 5.0  --   HGB 12.5 13.3  HCT 39.7 39.0  MCV 109.4*  --   PLT 170  --    Basic Metabolic Panel: Recent Labs  Lab 03/10/21 1851 03/10/21 1857  NA 136 138  K 4.2 4.2  CL 105 106  CO2 25  --   GLUCOSE 183* 171*  BUN 22 26*  CREATININE 1.23* 1.10*  CALCIUM 10.8*  --    Liver Function Tests: Recent Labs  Lab 03/10/21 1851  AST 26  ALT 12  ALKPHOS 64  BILITOT 0.6  PROT 6.5  ALBUMIN 3.7   Coagulation Profile: Recent Labs  Lab 03/10/21 1851  INR 1.4*   HbA1C: Recent Labs    03/11/21 0503  HGBA1C 5.3   CBG: No results for input(s): GLUCAP in the last 168 hours.  Recent Results (from the past 240 hour(s))  Resp Panel by RT-PCR (Flu A&B, Covid) Nasopharyngeal Swab     Status: None   Collection Time: 03/10/21  6:51 PM   Specimen: Nasopharyngeal Swab; Nasopharyngeal(NP) swabs in vial transport medium  Result Value Ref Range Status   SARS Coronavirus 2 by RT PCR NEGATIVE NEGATIVE Final    Comment: (NOTE) SARS-CoV-2 target nucleic acids are NOT DETECTED.  The SARS-CoV-2 RNA is generally detectable in upper respiratory specimens during the acute phase of infection. The lowest concentration of SARS-CoV-2 viral copies this assay can detect is 138 copies/mL. A negative result does not preclude SARS-Cov-2 infection and should not be used as the sole basis for treatment or other patient management decisions. A negative result may occur with  improper specimen  collection/handling, submission of specimen other than nasopharyngeal swab, presence of viral mutation(s) within the areas targeted by this assay, and inadequate number of viral copies(<138 copies/mL). A negative result must be combined with clinical observations, patient history, and epidemiological information. The expected result is Negative.  Fact Sheet for Patients:  EntrepreneurPulse.com.au  Fact Sheet for Healthcare Providers:  IncredibleEmployment.be  This test is no t yet approved or cleared by the [place] FDA and  has been authorized for detection and/or diagnosis of SARS-CoV-2 by FDA under an Emergency Use Authorization (EUA). This EUA will remain  in effect (meaning this test can be used) for the duration of the COVID-19 declaration under Section 564(b)(1) of the Act, 21 U.S.C.section 360bbb-3(b)(1), unless the authorization is terminated  or revoked sooner.  Influenza A by PCR NEGATIVE NEGATIVE Final   Influenza B by PCR NEGATIVE NEGATIVE Final    Comment: (NOTE) The Xpert Xpress SARS-CoV-2/FLU/RSV plus assay is intended as an aid in the diagnosis of influenza from Nasopharyngeal swab specimens and should not be used as a sole basis for treatment. Nasal washings and aspirates are unacceptable for Xpert Xpress SARS-CoV-2/FLU/RSV testing.  Fact Sheet for Patients: EntrepreneurPulse.com.au  Fact Sheet for Healthcare Providers: IncredibleEmployment.be  This test is not yet approved or cleared by the [place] FDA and has been authorized for detection and/or diagnosis of SARS-CoV-2 by FDA under an Emergency Use Authorization (EUA). This EUA will remain in effect (meaning this test can be used) for the duration of the COVID-19 declaration under Section 564(b)(1) of the Act, 21 U.S.C. section 360bbb-3(b)(1), unless the authorization is terminated or revoked.  Performed at [facility], [place] [address] [place], [place] [address]      Radiology Studies: CT Head Wo Contrast  Result Date: 03/10/2021 CLINICAL DATA:  Slurred speech and dizziness EXAM: CT HEAD WITHOUT CONTRAST TECHNIQUE: Contiguous axial images were obtained from the base of the skull through the vertex without intravenous contrast. COMPARISON:  02/22/2017 FINDINGS: Brain: No evidence of acute infarction, hemorrhage, hydrocephalus, extra-axial collection or mass lesion/mass effect. Chronic atrophic and ischemic changes are noted. Vascular: No hyperdense vessel or unexpected calcification. Skull: Normal. Negative for fracture or focal lesion. Sinuses/Orbits: No acute finding. Other: None. IMPRESSION: Chronic atrophic and ischemic changes. No acute abnormality is noted. Electronically Signed   By: Inez Catalina M.D.   On: 03/10/2021 20:06   VAS US CAROTID  Result Date: 03/11/2021 Carotid Arterial Duplex Study Patient Name:  TYKIRA WACHS  Date of Exam:   03/11/2021 Medical Rec #: [id number]          Accession #:    [id number] Date of Birth: 07-23-1933          Patient Gender: F Patient Age:   36Y Exam Location:  [facility] Procedure:      VAS US CAROTID Referring Phys: [id number] VASUNDHRA RATHORE --------------------------------------------------------------------------------  Indications:       TIA. Comparison Study:  No prior studies. Performing Technologist: Darlin Coco RDMS,RVT  Examination Guidelines: A complete evaluation includes B-mode imaging, spectral Doppler, color Doppler, and power Doppler as needed of all accessible portions of each vessel. Bilateral testing is considered an integral part of a complete examination. Limited examinations for reoccurring indications may be performed as noted.  Right Carotid Findings: +----------+--------+--------+--------+------------------+--------+           PSV cm/sEDV cm/sStenosisPlaque DescriptionComments  +----------+--------+--------+--------+------------------+--------+ CCA Prox  79      16                                         +----------+--------+--------+--------+------------------+--------+ CCA Distal51      17              focal and calcific         +----------+--------+--------+--------+------------------+--------+ ICA Prox  56      17      1-39%                              +----------+--------+--------+--------+------------------+--------+ ICA Distal79      25                                         +----------+--------+--------+--------+------------------+--------+  ECA       45      9                                          +----------+--------+--------+--------+------------------+--------+ +----------+--------+-------+----------------+-------------------+           PSV cm/sEDV cmsDescribe        Arm Pressure (mmHG) +----------+--------+-------+----------------+-------------------+ [id number]             Multiphasic, WNL                    +----------+--------+-------+----------------+-------------------+ +---------+--------+--+--------+--+---------+ VertebralPSV cm/s40EDV cm/s13Antegrade +---------+--------+--+--------+--+---------+  Left Carotid Findings: +----------+--------+--------+--------+------------------+--------+           PSV cm/sEDV cm/sStenosisPlaque DescriptionComments +----------+--------+--------+--------+------------------+--------+ CCA Prox  60      19                                         +----------+--------+--------+--------+------------------+--------+ CCA Distal64      19                                         +----------+--------+--------+--------+------------------+--------+ ICA Prox  43      16      1-39%   heterogenous               +----------+--------+--------+--------+------------------+--------+ ICA Distal74      28                                          +----------+--------+--------+--------+------------------+--------+ ECA       64      12                                         +----------+--------+--------+--------+------------------+--------+ +----------+--------+--------+----------------+-------------------+           PSV cm/sEDV cm/sDescribe        Arm Pressure (mmHG) +----------+--------+--------+----------------+-------------------+ [id number]              Multiphasic, WNL                    +----------+--------+--------+----------------+-------------------+ +---------+--------+--+--------+--+---------+ VertebralPSV cm/s73EDV cm/s22Antegrade +---------+--------+--+--------+--+---------+   Summary: Right Carotid: Velocities in the right ICA are consistent with a 1-39% stenosis. Left Carotid: Velocities in the left ICA are consistent with a 1-39% stenosis. Vertebrals:  Bilateral vertebral arteries demonstrate antegrade flow. Subclavians: Normal flow hemodynamics were seen in bilateral subclavian              arteries. *See table(s) above for measurements and observations.     Preliminary    VAS [place] TRANSCRANIAL DOPPLER  Result Date: 03/11/2021  Transcranial Doppler Patient Name:  ROZLYN CHUBA Goracke  Date of Exam:   03/11/2021 Medical Rec #: [id number]          Accession #:    [id number] Date of Birth: 1933/08/23          Patient Gender: F Patient Age:   74Y Exam Location:  [facility] Procedure:      VAS [place] TRANSCRANIAL  DOPPLER Referring Phys: [id number] Rosalin Hawking --------------------------------------------------------------------------------  Indications: TIA -evaluate for ischemia. Limitations for diagnostic windows: Unable to insonate right transtemporal window. Unable to insonate left transtemporal window. Comparison Study: No prior studies. Performing Technologist: Rogelia Rohrer Supporting Technologist: Darlin Coco RDMS,RVT  Examination Guidelines: A complete evaluation includes B-mode imaging, spectral Doppler, color  Doppler, and power Doppler as needed of all accessible portions of each vessel. Bilateral testing is considered an integral part of a complete examination. Limited examinations for reoccurring indications may be performed as noted.  +----------+-------------+----------+-----------+------------------+ RIGHT TCD Right VM (cm)Depth (cm)Pulsatility     Comment       +----------+-------------+----------+-----------+------------------+ MCA                                         Unable to insonate +----------+-------------+----------+-----------+------------------+ ACA                                         Unable to insonate +----------+-------------+----------+-----------+------------------+ Term ICA                                    Unable to insonate +----------+-------------+----------+-----------+------------------+ PCA                                         Unable to insonate +----------+-------------+----------+-----------+------------------+ Opthalmic     23.00                 1.00                       +----------+-------------+----------+-----------+------------------+ ICA siphon    35.00                 1.04                       +----------+-------------+----------+-----------+------------------+ Vertebral    -28.00                 1.26                       +----------+-------------+----------+-----------+------------------+  +----------+------------+----------+-----------+------------------+ LEFT TCD  Left VM (cm)Depth (cm)Pulsatility     Comment       +----------+------------+----------+-----------+------------------+ MCA                                        Unable to insonate +----------+------------+----------+-----------+------------------+ ACA                                        Unable to insonate +----------+------------+----------+-----------+------------------+ Term ICA                                   Unable to  insonate +----------+------------+----------+-----------+------------------+ PCA  Unable to insonate +----------+------------+----------+-----------+------------------+ Opthalmic    25.00                 1.01                       +----------+------------+----------+-----------+------------------+ ICA siphon   23.00                 1.32                       +----------+------------+----------+-----------+------------------+ Vertebral    -23.00                0.97                       +----------+------------+----------+-----------+------------------+  +------------+-------+-------+             VM cm/sComment +------------+-------+-------+ Prox Basilar-26.00         +------------+-------+-------+ Dist Basilar-26.00         +------------+-------+-------+    Preliminary       Marzetta Board, MD, PhD [facility]  Between 7 am - 7 pm I am available, please contact me via Amion (for emergencies) or Securechat (non urgent messages)  Between 7 pm - 7 am I am not available, please contact night coverage MD/APP via Amion

## 2021-03-11 NOTE — Progress Notes (Signed)
STROKE TEAM PROGRESS NOTE   SUBJECTIVE (INTERVAL HISTORY) Her husband is at the bedside.  Overall her condition is completely resolved. She stated that she was in an restaurant yesterday when her speech difficulty started and by the time in ER, symptoms resolved. Lasted about one hour. Currently she speaks well.    OBJECTIVE Temp:  [97.6 F (36.4 C)-98.7 F (37.1 C)] 97.6 F (36.4 C) (05/17 1154) Pulse Rate:  [69-70] 70 (05/17 1154) Cardiac Rhythm: Ventricular paced (05/17 0716) Resp:  [17-25] 18 (05/17 1154) BP: (138-173)/(63-97) 145/97 (05/17 1154) SpO2:  [94 %-100 %] 98 % (05/17 1154) Weight:  [81.6 kg] 81.6 kg (05/16 1828)  No results for input(s): GLUCAP in the last 168 hours. Recent Labs  Lab 03/10/21 1851 03/10/21 1857  NA 136 138  K 4.2 4.2  CL 105 106  CO2 25  --   GLUCOSE 183* 171*  BUN 22 26*  CREATININE 1.23* 1.10*  CALCIUM 10.8*  --    Recent Labs  Lab 03/10/21 1851  AST 26  ALT 12  ALKPHOS 64  BILITOT 0.6  PROT 6.5  ALBUMIN 3.7   Recent Labs  Lab 03/10/21 1851 03/10/21 1857  WBC 7.6  --   NEUTROABS 5.0  --   HGB 12.5 13.3  HCT 39.7 39.0  MCV 109.4*  --   PLT 170  --    No results for input(s): CKTOTAL, CKMB, CKMBINDEX, TROPONINI in the last 168 hours. Recent Labs    03/10/21 1851  LABPROT 16.9*  INR 1.4*   Recent Labs    03/10/21 1842  COLORURINE YELLOW  LABSPEC 1.011  PHURINE 6.0  GLUCOSEU NEGATIVE  HGBUR NEGATIVE  BILIRUBINUR NEGATIVE  KETONESUR NEGATIVE  PROTEINUR NEGATIVE  NITRITE NEGATIVE  LEUKOCYTESUR SMALL*       Component Value Date/Time   CHOL 219 (H) 03/11/2021 0503   TRIG 176 (H) 03/11/2021 0503   HDL 67 03/11/2021 0503   CHOLHDL 3.3 03/11/2021 0503   VLDL 35 03/11/2021 0503   LDLCALC 117 (H) 03/11/2021 0503   Lab Results  Component Value Date   HGBA1C 5.3 03/11/2021      Component Value Date/Time   LABOPIA NONE DETECTED 03/10/2021 1842   COCAINSCRNUR NONE DETECTED 03/10/2021 1842   LABBENZ NONE  DETECTED 03/10/2021 1842   AMPHETMU NONE DETECTED 03/10/2021 1842   THCU NONE DETECTED 03/10/2021 1842   LABBARB NONE DETECTED 03/10/2021 1842    Recent Labs  Lab 03/10/21 1842  ETH <10    I have personally reviewed the radiological images below and agree with the radiology interpretations.  CT Head Wo Contrast  Result Date: 03/10/2021 CLINICAL DATA:  Slurred speech and dizziness EXAM: CT HEAD WITHOUT CONTRAST TECHNIQUE: Contiguous axial images were obtained from the base of the skull through the vertex without intravenous contrast. COMPARISON:  02/22/2017 FINDINGS: Brain: No evidence of acute infarction, hemorrhage, hydrocephalus, extra-axial collection or mass lesion/mass effect. Chronic atrophic and ischemic changes are noted. Vascular: No hyperdense vessel or unexpected calcification. Skull: Normal. Negative for fracture or focal lesion. Sinuses/Orbits: No acute finding. Other: None. IMPRESSION: Chronic atrophic and ischemic changes. No acute abnormality is noted. Electronically Signed   By: Inez Catalina M.D.   On: 03/10/2021 20:06   VAS US CAROTID  Result Date: 03/11/2021 Carotid Arterial Duplex Study Patient Name:  JOHNNY LATU  Date of Exam:   03/11/2021 Medical Rec #: [id number]          Accession #:    [id number] Date of  Birth: July 28, 1933          Patient Gender: F Patient Age:   87Y Exam Location:  [facility] Procedure:      VAS US CAROTID Referring Phys: [id number] VASUNDHRA RATHORE --------------------------------------------------------------------------------  Indications:       TIA. Comparison Study:  No prior studies. Performing Technologist: Darlin Coco RDMS,RVT  Examination Guidelines: A complete evaluation includes B-mode imaging, spectral Doppler, color Doppler, and power Doppler as needed of all accessible portions of each vessel. Bilateral testing is considered an integral part of a complete examination. Limited examinations for reoccurring indications may be  performed as noted.  Right Carotid Findings: +----------+--------+--------+--------+------------------+--------+           PSV cm/sEDV cm/sStenosisPlaque DescriptionComments +----------+--------+--------+--------+------------------+--------+ CCA Prox  79      16                                         +----------+--------+--------+--------+------------------+--------+ CCA Distal51      17              focal and calcific         +----------+--------+--------+--------+------------------+--------+ ICA Prox  56      17      1-39%                              +----------+--------+--------+--------+------------------+--------+ ICA Distal79      25                                         +----------+--------+--------+--------+------------------+--------+ ECA       45      9                                          +----------+--------+--------+--------+------------------+--------+ +----------+--------+-------+----------------+-------------------+           PSV cm/sEDV cmsDescribe        Arm Pressure (mmHG) +----------+--------+-------+----------------+-------------------+ [id number]             Multiphasic, WNL                    +----------+--------+-------+----------------+-------------------+ +---------+--------+--+--------+--+---------+ VertebralPSV cm/s40EDV cm/s13Antegrade +---------+--------+--+--------+--+---------+  Left Carotid Findings: +----------+--------+--------+--------+------------------+--------+           PSV cm/sEDV cm/sStenosisPlaque DescriptionComments +----------+--------+--------+--------+------------------+--------+ CCA Prox  60      19                                         +----------+--------+--------+--------+------------------+--------+ CCA Distal64      19                                         +----------+--------+--------+--------+------------------+--------+ ICA Prox  43      16      1-39%    heterogenous               +----------+--------+--------+--------+------------------+--------+ ICA Distal74      28                                         +----------+--------+--------+--------+------------------+--------+  ECA       64      12                                         +----------+--------+--------+--------+------------------+--------+ +----------+--------+--------+----------------+-------------------+           PSV cm/sEDV cm/sDescribe        Arm Pressure (mmHG) +----------+--------+--------+----------------+-------------------+ [id number]              Multiphasic, WNL                    +----------+--------+--------+----------------+-------------------+ +---------+--------+--+--------+--+---------+ VertebralPSV cm/s73EDV cm/s22Antegrade +---------+--------+--+--------+--+---------+   Summary: Right Carotid: Velocities in the right ICA are consistent with a 1-39% stenosis. Left Carotid: Velocities in the left ICA are consistent with a 1-39% stenosis. Vertebrals:  Bilateral vertebral arteries demonstrate antegrade flow. Subclavians: Normal flow hemodynamics were seen in bilateral subclavian              arteries. *See table(s) above for measurements and observations.     Preliminary    VAS [place] TRANSCRANIAL DOPPLER  Result Date: 03/11/2021  Transcranial Doppler Patient Name:  DECIE VERNE Kloc  Date of Exam:   03/11/2021 Medical Rec #: [id number]          Accession #:    [id number] Date of Birth: 10-13-1933          Patient Gender: F Patient Age:   12Y Exam Location:  [facility] Procedure:      VAS [place] TRANSCRANIAL DOPPLER Referring Phys: [id number] Rosalin Hawking --------------------------------------------------------------------------------  Indications: TIA -evaluate for ischemia. Limitations for diagnostic windows: Unable to insonate right transtemporal window. Unable to insonate left transtemporal window. Comparison Study: No prior studies. Performing  Technologist: Rogelia Rohrer Supporting Technologist: Darlin Coco RDMS,RVT  Examination Guidelines: A complete evaluation includes B-mode imaging, spectral Doppler, color Doppler, and power Doppler as needed of all accessible portions of each vessel. Bilateral testing is considered an integral part of a complete examination. Limited examinations for reoccurring indications may be performed as noted.  +----------+-------------+----------+-----------+------------------+ RIGHT TCD Right VM (cm)Depth (cm)Pulsatility     Comment       +----------+-------------+----------+-----------+------------------+ MCA                                         Unable to insonate +----------+-------------+----------+-----------+------------------+ ACA                                         Unable to insonate +----------+-------------+----------+-----------+------------------+ Term ICA                                    Unable to insonate +----------+-------------+----------+-----------+------------------+ PCA                                         Unable to insonate +----------+-------------+----------+-----------+------------------+ Opthalmic     23.00                 1.00                       +----------+-------------+----------+-----------+------------------+  ICA siphon    35.00                 1.04                       +----------+-------------+----------+-----------+------------------+ Vertebral    -28.00                 1.26                       +----------+-------------+----------+-----------+------------------+  +----------+------------+----------+-----------+------------------+ LEFT TCD  Left VM (cm)Depth (cm)Pulsatility     Comment       +----------+------------+----------+-----------+------------------+ MCA                                        Unable to insonate +----------+------------+----------+-----------+------------------+ ACA                                         Unable to insonate +----------+------------+----------+-----------+------------------+ Term ICA                                   Unable to insonate +----------+------------+----------+-----------+------------------+ PCA                                        Unable to insonate +----------+------------+----------+-----------+------------------+ Opthalmic    25.00                 1.01                       +----------+------------+----------+-----------+------------------+ ICA siphon   23.00                 1.32                       +----------+------------+----------+-----------+------------------+ Vertebral    -23.00                0.97                       +----------+------------+----------+-----------+------------------+  +------------+-------+-------+             VM cm/sComment +------------+-------+-------+ Prox Basilar-26.00         +------------+-------+-------+ Dist Basilar-26.00         +------------+-------+-------+    Preliminary     PHYSICAL EXAM  Temp:  [97.6 F (36.4 C)-98.7 F (37.1 C)] 97.6 F (36.4 C) (05/17 1154) Pulse Rate:  [69-70] 70 (05/17 1154) Resp:  [17-25] 18 (05/17 1154) BP: (138-173)/(63-97) 145/97 (05/17 1154) SpO2:  [94 %-100 %] 98 % (05/17 1154) Weight:  [81.6 kg] 81.6 kg (05/16 1828)  General - Well nourished, well developed, in no apparent distress. Bruise on the right face due to recent skin surgery  Ophthalmologic - fundi not visualized due to noncooperation.  Cardiovascular - irregularly irregular heart rate and rhythm.  Mental Status -  Level of arousal and orientation to time, place, and person were intact. Language including expression, naming, repetition, comprehension was assessed and found intact. Fund of Knowledge was assessed and was intact.  Cranial Nerves II - XII -  II - Visual field intact OU. III, IV, VI - Extraocular movements intact. V - Facial sensation intact bilaterally. VII -  Facial movement intact bilaterally. VIII - Hearing & vestibular intact bilaterally. X - Palate elevates symmetrically. XI - Chin turning & shoulder shrug intact bilaterally. XII - Tongue protrusion intact.  Motor Strength - The patient's strength was normal in all extremities and pronator drift was absent.  Bulk was normal and fasciculations were absent.   Motor Tone - Muscle tone was assessed at the neck and appendages and was normal.  Reflexes - The patient's reflexes were symmetrical in all extremities and she had no pathological reflexes.  Sensory - Light touch, temperature/pinprick were assessed and were symmetrical.    Coordination - The patient had normal movements in the hands with no ataxia or dysmetria.  Tremor was absent.  Gait and Station - deferred.   ASSESSMENT/PLAN Ms. MANDEEP MATZA is a 87 y.o. female with history of afib on eliquis, HTN, pacemaker, CHF, CKD admitted for confusion and aphasia. No tPA given due to symptoms resolved. Pt denies HA or hx of migraine. No LOC  TIA:  left brain TIA with transient aphasia. No HA or hx of migraine and less likely for seizure  CT no acute finding  MRI not compatible with pacer  CT repeat in am  Carotid Doppler unremarkable  TCD pending  2D Echo  12/2020 EF 40-45%  LDL 117  HgbA1c 5.3  SCDs for VTE prophylaxis  Eliquis (apixaban) daily prior to admission, now on Eliquis (apixaban) daily given neuro at baseline.  Patient counseled to be compliant with her antithrombotic medications  Ongoing aggressive stroke risk factor management  Therapy recommendations:  HH PT  Disposition:  pending  Hypertension . Stable  Long term BP goal normotensive  Hyperlipidemia  Home meds:  crestor 57   Follows with Dr. Radford Pax cardiology  LDL 117, goal < 70  Now on crestor 10  Continue statin at discharge  Other Stroke Risk Factors  Advanced age  CHF  Other Active Problems  Pacemaker  Skin cancer s/p  surgery  Hospital day # 0    Rosalin Hawking, MD PhD Stroke Neurology 03/11/2021 12:27 PM    To contact Stroke Continuity provider, please refer to [email or web address] After hours, contact General Neurology

## 2021-03-11 NOTE — TOC Initial Note (Addendum)
Transition of Care [facility]) - Initial/Assessment Note    Patient Details  Name: Molly [name] MRN: [id number] Date of Birth: 1933/10/11  [facility] CM/SW Contact:    Marilu Favre, RN Phone Number: 03/11/2021, 11:13 AM  Clinical Narrative:                 Patient from home with husband. Spoke to patient and husband at bedside. Patient active with [facility] and would like to continue . Stacie with [facility] aware.   Patient has a walker at home.   Ordered 3 in 1   Added HHOT , Stacie with [facility]    Expected Discharge Plan: [place]     Patient Goals and CMS Choice Patient states their goals for this hospitalization and ongoing recovery are:: to return to home CMS Medicare.gov Compare Post Acute Care list provided to:: Patient Choice offered to / list presented to : [facility]  Expected Discharge Plan and Services Expected Discharge Plan: [place]   Discharge Planning Services: CM Consult Post Acute Care Choice: Home Health,Durable Medical Equipment Living arrangements for the past 2 months: Single Family Home                 DME Arranged: 3-N-1 DME Agency: [employer] Date DME Agency Contacted: 03/11/21 Time DME Agency Contacted: 1112 Representative spoke with at DME Agency: Freda Munro HH Arranged: PT [place]: [place] Date [place]: 03/11/21 Time [place]: 77 Representative spoke with at [place]: [place] Arrangements/Services Living arrangements for the past 2 months: [place] Lives with:: Spouse Patient language and need for interpreter reviewed:: Yes Do you feel safe going back to the place where you live?: Yes      Need for Family Participation in Patient Care: Yes (Comment) Care giver support system in place?: Yes (comment) Current home services: DME Criminal Activity/Legal Involvement Pertinent to Current  Situation/Hospitalization: No - Comment as needed  Activities of Daily Living Home Assistive Devices/Equipment: Environmental consultant (specify type) (Front wheel walker) ADL Screening (condition at time of admission) Patient's cognitive ability adequate to safely complete daily activities?: Yes Is the patient deaf or have difficulty hearing?: No Does the patient have difficulty seeing, even when wearing glasses/contacts?: No Does the patient have difficulty concentrating, remembering, or making decisions?: No Patient able to express need for assistance with ADLs?: Yes Does the patient have difficulty dressing or bathing?: Yes Independently performs ADLs?: No Communication: Independent Dressing (OT): Needs assistance Is this a change from baseline?: Change from baseline, expected to last <3days Grooming: Needs assistance Is this a change from baseline?: Change from baseline, expected to last <3 days Feeding: Independent Bathing: Needs assistance Is this a change from baseline?: Change from baseline, expected to last <3 days Toileting: Needs assistance Is this a change from baseline?: Change from baseline, expected to last <3 days In/Out Bed: Needs assistance Is this a change from baseline?: Change from baseline, expected to last <3 days Walks in Home: Needs assistance Is this a change from baseline?: Change from baseline, expected to last <3 days Does the patient have difficulty walking or climbing stairs?: Yes Weakness of Legs: Both Weakness of Arms/Hands: None  Permission Sought/Granted   Permission granted to share information with : Yes, Verbal Permission Granted  Share Information with NAME: husband Cornelia Copa           Emotional Assessment Appearance:: Appears stated age Attitude/Demeanor/Rapport: Engaged  Affect (typically observed): Accepting Orientation: : Oriented to Self,Oriented to Place,Oriented to  Time,Oriented to Situation Alcohol / Substance Use: Not Applicable Psych  Involvement: No (comment)  Admission diagnosis:  TIA (transient ischemic attack) [G45.9] Transient ischemic attack (TIA) [G45.9] Patient Active Problem List   Diagnosis Date Noted  . TIA (transient ischemic attack) 03/10/2021  . Hyperparathyroidism, primary ([place]) 12/04/2020  . Complete heart block ([place]) 09/27/2019  . Atrial tachycardia ([place]) 09/13/2019  . Iron deficiency anemia 08/14/2019  . Long term (current) use of anticoagulants 07/12/2019  . Acute on chronic combined systolic and diastolic CHF (congestive heart failure) ([place]) 05/25/2019  . CKD (chronic kidney disease), stage III ([place]) 04/24/2019  . Back pain, chronic 09/19/2018  . Anxiety disorder, unspecified 09/19/2018  . Visit for monitoring Tikosyn therapy 02/07/2018  . Chronic combined systolic and diastolic heart failure ([place]) 12/28/2017  . Hypotension 05/17/2017  . SOB (shortness of breath)   . DCM (dilated cardiomyopathy) ([place]) 11/10/2016  . Aortic insufficiency   . Mitral regurgitation 11/06/2015  . Tachycardia-bradycardia syndrome ([place])   . Pacemaker 04/18/2013  . Claw toe, acquired 06/28/2012  . Pain in joint involving ankle and foot 06/28/2012  . PVC (premature ventricular contraction) 06/07/2012  . Fatigue 06/07/2012  . Symptomatic bradycardia 05/23/2012  . Hyperlipidemia 10/29/2009  . PULMONARY FUNCTION TESTS, ABNORMAL 10/29/2009  . Essential hypertension 10/28/2009  . Persistent atrial fibrillation ([place]) 10/28/2009   PCP:  Harlan Stains, MD Pharmacy:   [id number] - [place], [address] AT [address] [place] [address] Phone: [phone] Fax: [phone]     Social Determinants of Health (SDOH) Interventions    Readmission Risk Interventions Readmission Risk Prevention Plan 05/30/2019  Post Dischage Appt Complete  Medication Screening Complete  Transportation Screening Complete  Some recent data might be hidden

## 2021-03-11 NOTE — Evaluation (Signed)
Speech Language Pathology Evaluation Patient Details Name: Molly [name] MRN: [id number] DOB: [birth year] Today's Date: 03/11/2021 Time: 6295-2841 SLP Time Calculation (min) (ACUTE ONLY): 32 min  Problem List:  Patient Active Problem List   Diagnosis Date Noted  . TIA (transient ischemic attack) 03/10/2021  . Hyperparathyroidism, primary ([place]) 12/04/2020  . Complete heart block ([place]) 09/27/2019  . Atrial tachycardia ([place]) 09/13/2019  . Iron deficiency anemia 08/14/2019  . Long term (current) use of anticoagulants 07/12/2019  . Acute on chronic combined systolic and diastolic CHF (congestive heart failure) ([place]) 05/25/2019  . CKD (chronic kidney disease), stage III ([place]) 04/24/2019  . Back pain, chronic 09/19/2018  . Anxiety disorder, unspecified 09/19/2018  . Visit for monitoring Tikosyn therapy 02/07/2018  . Chronic combined systolic and diastolic heart failure ([place]) 12/28/2017  . Hypotension 05/17/2017  . SOB (shortness of breath)   . DCM (dilated cardiomyopathy) ([place]) 11/10/2016  . Aortic insufficiency   . Mitral regurgitation 11/06/2015  . Tachycardia-bradycardia syndrome ([place])   . Pacemaker 04/18/2013  . Claw toe, acquired 06/28/2012  . Pain in joint involving ankle and foot 06/28/2012  . PVC (premature ventricular contraction) 06/07/2012  . Fatigue 06/07/2012  . Symptomatic bradycardia 05/23/2012  . Hyperlipidemia 10/29/2009  . PULMONARY FUNCTION TESTS, ABNORMAL 10/29/2009  . Essential hypertension 10/28/2009  . Persistent atrial fibrillation ([place]) 10/28/2009   Past Medical History:  Past Medical History:  Diagnosis Date  . Aortic insufficiency   . Atrial tachycardia ([place])   . Cancer ([place])    Skin cancer- basal.  1 mylenoma  . Chronic combined systolic and diastolic CHF (congestive heart failure) ([place])   . CKD (chronic kidney disease), stage III ([place])   . Complication of anesthesia   . DCM (dilated cardiomyopathy) ([place])    EF 40-45% by echo 2017 - nuclear  stress test with no ischemia  . GERD (gastroesophageal reflux disease)   . H/O benign essential tremor    on amiodarone resolved off amio  . H/O cardiac radiofrequency ablation    a. AV node ablation in 05/2019.  Marland Kitchen H/O hyperkalemia    Resolved off ACE inhibitors  . High cholesterol   . History of blood transfusion   . Hyperkalemia   . Hyperparathyroidism ([place])   . Hypertension   . Mitral regurgitation    trivial by echo 12/2020  . Persistent atrial fibrillation ([place])   . PONV (postoperative nausea and vomiting)   . PVC's (premature ventricular contractions)   . Tachycardia-bradycardia syndrome [facility])    s/p PPM 02/2013   Past Surgical History:  Past Surgical History:  Procedure Laterality Date  . AMPUTATION Right 11/27/2014   Procedure: RIGHT 2ND AND 3RD TOE AMPUTATION;  Surgeon: Wylene Simmer, MD;  Location: [place];  Service: Orthopedics;  Laterality: Right;  . ATRIAL FIBRILLATION ABLATION N/A 05/29/2019   Procedure: AVN ABLATION;  Surgeon: Evans Lance, MD;  Location: [place] [facility];  Service: Cardiovascular;  Laterality: N/A;  . BIV UPGRADE N/A 09/27/2019   Procedure: BIV UPGRADE;  Surgeon: Evans Lance, MD;  Location: [place] [facility];  Service: Cardiovascular;  Laterality: N/A;  . BUNIONECTOMY     Right  . CARDIAC CATHETERIZATION  09/25/2000   EF of 50% -- with normal left ventricular size and function  . CARDIOVERSION N/A 12/28/2017   Procedure: CARDIOVERSION;  Surgeon: Dorothy Spark, MD;  Location[facility] ENDOSCOPY;  Service: Cardiovascular;  Laterality: N/A;  . CARDIOVERSION N/A 02/25/2018   Procedure: CARDIOVERSION;  Surgeon: Skeet Latch, MD;  Location: [facility] ENDOSCOPY;  Service: Cardiovascular;  Laterality: N/A;  . CESAREAN SECTION    . CESAREAN SECTION    . EYE SURGERY Bilateral    Cataract  . FOOT SURGERY    . INSERT / REPLACE / REMOVE PACEMAKER  02/2013  . PACEMAKER INSERTION  02/2013  . PERMANENT PACEMAKER INSERTION N/A 03/02/2013   Procedure: PERMANENT  PACEMAKER INSERTION;  Surgeon: Evans Lance, MD;  Location[facility] CATH LAB;  Service: Cardiovascular;  Laterality: N/A;  . SHOULDER ARTHROSCOPY W/ ROTATOR CUFF REPAIR    . SHOULDER SURGERY Right    roto cuff  . TOE AMPUTATION Left    2nd and 3rd, bunion under toes.  . TONSILLECTOMY    . VARICOSE VEIN SURGERY     HPI:  LISSETTE SCHENK is a 87 y.o. female who presented to the ED via EMS for evaluation of stroke-like symptoms including slurred speech and word finding difficulty. CT Head showing some more chronic changes but nothing acute; noteworthy also is that she fell about 6-8 weeks ago, and is still recovering from that fall,  recieving HHPT, very painful L knee and lower leg still;  with medical history significant of permanent A. fib on Eliquis, tachybrady syndrome status post PPM, LBBB, chronic combined CHF/nonischemic cardiomyopathy with LVEF 40 to 45% on echo 01/07/2021, hypertension, hyperlipidemia, CKD stage III, hyperparathyroidism, hypothyroidism, chronic pruritus   Assessment / Plan / Recommendation Clinical Impression  Pt scored 22/30 on the SLUMS, which when calculated to reflect education level, is indicative of mild impairment. More specifically throughout her evaluation she exhibited difficulties with more complex language tasks, including both verbal expression and receptive comprehension. She speaks fluently and does not have evidence of anomia in conversation, but was only able to produce nine words during a one-minute divergent naming task. She also had trouble comprehending the instructions for more complex tasks, needing repetitions and additional cues but sometimes still having difficulty understanding what she was being asked to do. It is possible that this could be near her baseline given more chronic findings on her CT, but pt believes that this is an acute change for her. Therefore, recommend [facility] SLP follow up. Pt is not sure if she wants to pursue any additional appointments  at this time, but will consider.    SLP Assessment  SLP Recommendation/Assessment: Patient needs continued Speech Lanaguage Pathology Services SLP Visit Diagnosis: Cognitive communication deficit (R41.841)    Follow Up Recommendations  Home health SLP    Frequency and Duration min 2x/week  2 weeks      SLP Evaluation Cognition  Overall Cognitive Status: No family/caregiver present to determine baseline cognitive functioning Arousal/Alertness: Awake/alert Orientation Level: Oriented X4 Attention: Sustained Sustained Attention: Appears intact Memory: Appears intact Awareness: Impaired Awareness Impairment: Anticipatory impairment Problem Solving: Impaired Problem Solving Impairment: Verbal complex Safety/Judgment: Appears intact       Comprehension  Auditory Comprehension Overall Auditory Comprehension: Impaired Commands: Impaired One Step Basic Commands: 75-100% accurate Complex Commands: 50-74% accurate Conversation: Simple    Expression Expression Primary Mode of Expression: Verbal Verbal Expression Overall Verbal Expression: Impaired Initiation: No impairment Automatic Speech: Name;Social Response Level of Generative/Spontaneous Verbalization: Conversation Naming: Impairment Divergent:  (produced 9 words in one minute) Pragmatics: No impairment Non-Verbal Means of Communication: Not applicable   Oral / Motor  Motor Speech Overall Motor Speech: Appears within functional limits for tasks assessed   GO  Osie Bond., M.A. [facility] Pager [phone] Office [phone]  03/11/2021, 4:53 PM

## 2021-03-12 ENCOUNTER — Observation Stay (HOSPITAL_COMMUNITY): Payer: Medicare HMO

## 2021-03-12 DIAGNOSIS — N1831 Chronic kidney disease, stage 3a: Secondary | ICD-10-CM | POA: Diagnosis not present

## 2021-03-12 DIAGNOSIS — I10 Essential (primary) hypertension: Secondary | ICD-10-CM | POA: Diagnosis not present

## 2021-03-12 DIAGNOSIS — G459 Transient cerebral ischemic attack, unspecified: Secondary | ICD-10-CM | POA: Diagnosis not present

## 2021-03-12 DIAGNOSIS — R4781 Slurred speech: Secondary | ICD-10-CM | POA: Diagnosis not present

## 2021-03-12 DIAGNOSIS — E7800 Pure hypercholesterolemia, unspecified: Secondary | ICD-10-CM | POA: Diagnosis not present

## 2021-03-12 DIAGNOSIS — I4819 Other persistent atrial fibrillation: Secondary | ICD-10-CM | POA: Diagnosis not present

## 2021-03-12 DIAGNOSIS — R42 Dizziness and giddiness: Secondary | ICD-10-CM | POA: Diagnosis not present

## 2021-03-12 DIAGNOSIS — R69 Illness, unspecified: Secondary | ICD-10-CM | POA: Diagnosis not present

## 2021-03-12 MED ORDER — ROSUVASTATIN CALCIUM 10 MG PO TABS: 10.0000 mg | ORAL_TABLET | Freq: Every day | ORAL | 1 refills | Status: DC

## 2021-03-12 NOTE — Discharge Summary (Signed)
Physician Discharge Summary  Molly [name] [id number] DOB: 03-Jan-1933 DOA: 03/10/2021  PCP: Harlan Stains, MD  Admit date: 03/10/2021 Discharge date: 03/12/2021  Admitted From: home Disposition:  home  Recommendations for Outpatient Follow-up:  1. Follow up with PCP in 1-2 weeks  Home Health: PT Equipment/Devices: walker  Discharge Condition: stable CODE STATUS: DNR Diet recommendation: heart healthy  HPI: Per admitting MD, Molly [name] is a 87 y.o. female with medical history significant of permanent A. fib on Eliquis, tachybrady syndrome status post PPM, LBBB, chronic combined CHF/nonischemic cardiomyopathy with LVEF 40 to 45% on echo 01/07/2021, hypertension, hyperlipidemia, CKD stage III, hyperparathyroidism, hypothyroidism, chronic pruritus presented to the ED via EMS for evaluation of strokelike symptoms including slurred speech and word finding difficulty with onset at around 1650 today.  Symptoms had resolved prior to ED arrival.  In the ED, blood pressure elevated with systolic in the [id number] to [id number].  Labs showing WBC 7.6, hemoglobin 12.5, MCV 109.4, platelet count 170K.  Sodium 136, potassium 4.2, chloride 105, bicarb 25, BUN 22, creatinine 1.2 (at baseline), glucose 183.  INR 1.4.  Blood ethanol level undetectable.  UDS pending.  UA pending.  Screening COVID and influenza PCR negative. Head CT negative for acute findings.  Neurology recommended admission for TIA work-up with further imaging including brain MRI, MRA head, and carotid ultrasound.  Hospital Course / Discharge diagnoses: Principal problem TIA - Patient admitted to the hospital with slurred speech and concern for a CVA. Neurology consulted and followed patient while hospitalized. She could not obtain an MRI due to her pacemaker and underwent serial CT scans which were stable without significant acute findings. PT evaluated patient recommending HHPT which was arranged with CM assistance. Did not underwent a  repeat 2D echo given most recent one 2 months ago showing EF 40-45%. A1C was 5.3, LDL 117, continue statin. Carotid US without significant stenosis.  Discussed care with neurology, recommending to increase the statin on discharge and continue her Eliquis.  Active problems Permanent A. Fib -Has a pacemaker. Continue home medications including Eliquis.  Chronic combined CHF -No signs of volume overload. Resume home medications Hypertension -resume home medications CKD stage IIIa -Stable. Creatinine at baseline. Hypothyroidism -Continue home Synthroid. TSH normal at 3.6 Macrocytosis without anemia -Continue home folate supplement Chronic pruritus -Continue home methotrexate, prednisone, Xyzal Anxiety -Continue home medications  Sepsis ruled out   Discharge Instructions   Allergies as of 03/12/2021      Reactions   Ace Inhibitors    hyperkalemia   Atorvastatin    Other reaction(s): myalgia, Other (See Comments)   Diltiazem Hcl    Other reaction(s): made patient feel terrible, Other (See Comments)   Duloxetine Hcl    Other reaction(s): dizziness/nausea, Other (See Comments)   Losartan    hyperkalemia   Codeine Nausea Only   Tramadol Nausea Only      Medication List    STOP taking these medications   doxycycline 100 MG capsule Commonly known as: VIBRAMYCIN     TAKE these medications   acetaminophen 500 MG tablet Commonly known as: TYLENOL Take 1,000 mg by mouth 2 (two) times daily. For pain   apixaban 5 MG Tabs tablet Commonly known as: ELIQUIS Take 1 tablet (5 mg total) by mouth 2 (two) times daily.   dorzolamide-timolol 22.3-6.8 MG/ML ophthalmic solution Commonly known as: COSOPT Place 1 drop into both eyes 2 (two) times daily.   folic acid 1 MG tablet Commonly known as: FOLVITE Take 1  tablet by mouth as directed. 6x a week Monday,Wednesday,Thursday,Friday,Saturday and Sunday.   furosemide 40 MG tablet Commonly known as: LASIX Take 1 tablet (40 mg total) by  mouth daily. What changed: how much to take   latanoprost 0.005 % ophthalmic solution Commonly known as: XALATAN Place 1 drop into both eyes at bedtime.   levocetirizine 5 MG tablet Commonly known as: XYZAL Take 1 tablet by mouth daily as needed for allergies.   LORazepam 0.5 MG tablet Commonly known as: ATIVAN TAKE 1 TABLET (0.5 MG TOTAL) BY MOUTH AT BEDTIME.   methotrexate 2.5 MG tablet Commonly known as: RHEUMATREX Take 5 mg by mouth every Tuesday.   omeprazole 20 MG capsule Commonly known as: PRILOSEC Take 20 mg by mouth daily.   predniSONE 5 MG tablet Commonly known as: DELTASONE Take 5 mg by mouth every morning.   rosuvastatin 10 MG tablet Commonly known as: CRESTOR Take 1 tablet (10 mg total) by mouth daily. Start taking on: Mar 13, 2021 What changed:   medication strength  how much to take   Synthroid 25 MCG tablet Generic drug: levothyroxine Take 25 mcg by mouth daily before breakfast. What changed: Another medication with the same name was removed. Continue taking this medication, and follow the directions you see here.            Durable Medical Equipment  (From admission, onward)         Start     Ordered   03/11/21 1116  For home use only DME 3 n 1  Once        05 /17/22 1115           Consultations:  Neurology   Procedures/Studies:  CT HEAD WO CONTRAST  Result Date: 03/12/2021 CLINICAL DATA:  87 year old female with recent slurred speech and dizziness. TIA EXAM: CT HEAD WITHOUT CONTRAST TECHNIQUE: Contiguous axial images were obtained from the base of the skull through the vertex without intravenous contrast. COMPARISON:  Head CT 03/10/2021 and earlier. FINDINGS: Brain: No midline shift, ventriculomegaly, mass effect, evidence of mass lesion, intracranial hemorrhage or evidence of cortically based acute infarction. Patchy, scattered bilateral white matter hypodensity appears stable from the recent CT. Stable dystrophic calcification  left basal ganglia. No cortical encephalomalacia identified. Vascular: Calcified atherosclerosis at the skull base. No suspicious intracranial vascular hyperdensity. Skull: No acute osseous abnormality identified. Sinuses/Orbits: Visualized paranasal sinuses and mastoids are clear. Other: Visualized orbits and scalp soft tissues are within normal limits. IMPRESSION: Stable non contrast CT appearance of the brain with moderate for age nonspecific white matter changes. No acute intracranial abnormality. Electronically Signed   By: Genevie Ann M.D.   On: 03/12/2021 06:23   CT Head Wo Contrast  Result Date: 03/10/2021 CLINICAL DATA:  Slurred speech and dizziness EXAM: CT HEAD WITHOUT CONTRAST TECHNIQUE: Contiguous axial images were obtained from the base of the skull through the vertex without intravenous contrast. COMPARISON:  02/22/2017 FINDINGS: Brain: No evidence of acute infarction, hemorrhage, hydrocephalus, extra-axial collection or mass lesion/mass effect. Chronic atrophic and ischemic changes are noted. Vascular: No hyperdense vessel or unexpected calcification. Skull: Normal. Negative for fracture or focal lesion. Sinuses/Orbits: No acute finding. Other: None. IMPRESSION: Chronic atrophic and ischemic changes. No acute abnormality is noted. Electronically Signed   By: Inez Catalina M.D.   On: 03/10/2021 20:06   VAS US CAROTID  Result Date: 03/11/2021 Carotid Arterial Duplex Study Patient Name:  LENIYA FREGOSO Arp  Date of Exam:   03/11/2021 Medical Rec #: [id number]  Accession #:    [id number] Date of Birth: 02-Sep-1933          Patient Gender: F Patient Age:   4Y Exam Location:  [facility] Procedure:      VAS US CAROTID Referring Phys: [id number] VASUNDHRA RATHORE --------------------------------------------------------------------------------  Indications:       TIA. Comparison Study:  No prior studies. Performing Technologist: Darlin Coco RDMS,RVT  Examination Guidelines: A complete  evaluation includes B-mode imaging, spectral Doppler, color Doppler, and power Doppler as needed of all accessible portions of each vessel. Bilateral testing is considered an integral part of a complete examination. Limited examinations for reoccurring indications may be performed as noted.  Right Carotid Findings: +----------+--------+--------+--------+------------------+--------+           PSV cm/sEDV cm/sStenosisPlaque DescriptionComments +----------+--------+--------+--------+------------------+--------+ CCA Prox  79      16                                         +----------+--------+--------+--------+------------------+--------+ CCA Distal51      17              focal and calcific         +----------+--------+--------+--------+------------------+--------+ ICA Prox  56      17      1-39%                              +----------+--------+--------+--------+------------------+--------+ ICA Distal79      25                                         +----------+--------+--------+--------+------------------+--------+ ECA       45      9                                          +----------+--------+--------+--------+------------------+--------+ +----------+--------+-------+----------------+-------------------+           PSV cm/sEDV cmsDescribe        Arm Pressure (mmHG) +----------+--------+-------+----------------+-------------------+ [id number]             Multiphasic, WNL                    +----------+--------+-------+----------------+-------------------+ +---------+--------+--+--------+--+---------+ VertebralPSV cm/s40EDV cm/s13Antegrade +---------+--------+--+--------+--+---------+  Left Carotid Findings: +----------+--------+--------+--------+------------------+--------+           PSV cm/sEDV cm/sStenosisPlaque DescriptionComments +----------+--------+--------+--------+------------------+--------+ CCA Prox  60      19                                          +----------+--------+--------+--------+------------------+--------+ CCA Distal64      19                                         +----------+--------+--------+--------+------------------+--------+ ICA Prox  43      16      1-39%   heterogenous               +----------+--------+--------+--------+------------------+--------+ ICA Distal74      28                                         +----------+--------+--------+--------+------------------+--------+  ECA       64      12                                         +----------+--------+--------+--------+------------------+--------+ +----------+--------+--------+----------------+-------------------+           PSV cm/sEDV cm/sDescribe        Arm Pressure (mmHG) +----------+--------+--------+----------------+-------------------+ [id number]              Multiphasic, WNL                    +----------+--------+--------+----------------+-------------------+ +---------+--------+--+--------+--+---------+ VertebralPSV cm/s73EDV cm/s22Antegrade +---------+--------+--+--------+--+---------+   Summary: Right Carotid: Velocities in the right ICA are consistent with a 1-39% stenosis. Left Carotid: Velocities in the left ICA are consistent with a 1-39% stenosis. Vertebrals:  Bilateral vertebral arteries demonstrate antegrade flow. Subclavians: Normal flow hemodynamics were seen in bilateral subclavian              arteries. *See table(s) above for measurements and observations.  Electronically signed by Antony Contras MD on 03/11/2021 at 1:25:59 PM.    Final    VAS [place] TRANSCRANIAL DOPPLER  Result Date: 03/12/2021  Transcranial Doppler Patient Name:  LANIA WENDLANDT Phung  Date of Exam:   03/11/2021 Medical Rec #: [id number]          Accession #:    [id number] Date of Birth: [birth year]          Patient Gender: F Patient Age:   4Y Exam Location:  [facility] Procedure:      VAS [place] TRANSCRANIAL DOPPLER Referring  Phys: [id number] Rosalin Hawking --------------------------------------------------------------------------------  Indications: TIA -evaluate for ischemia. Limitations for diagnostic windows: Unable to insonate right transtemporal window. Unable to insonate left transtemporal window. Comparison Study: No prior studies. Performing Technologist: Rogelia Rohrer Supporting Technologist: Darlin Coco RDMS,RVT  Examination Guidelines: A complete evaluation includes B-mode imaging, spectral Doppler, color Doppler, and power Doppler as needed of all accessible portions of each vessel. Bilateral testing is considered an integral part of a complete examination. Limited examinations for reoccurring indications may be performed as noted.  +----------+-------------+----------+-----------+------------------+ RIGHT TCD Right VM (cm)Depth (cm)Pulsatility     Comment       +----------+-------------+----------+-----------+------------------+ MCA                                         Unable to insonate +----------+-------------+----------+-----------+------------------+ ACA                                         Unable to insonate +----------+-------------+----------+-----------+------------------+ Term ICA                                    Unable to insonate +----------+-------------+----------+-----------+------------------+ PCA                                         Unable to insonate +----------+-------------+----------+-----------+------------------+ Opthalmic     23.00                 1.00                       +----------+-------------+----------+-----------+------------------+  ICA siphon    35.00                 1.04                       +----------+-------------+----------+-----------+------------------+ Vertebral    -28.00                 1.26                       +----------+-------------+----------+-----------+------------------+   +----------+------------+----------+-----------+------------------+ LEFT TCD  Left VM (cm)Depth (cm)Pulsatility     Comment       +----------+------------+----------+-----------+------------------+ MCA                                        Unable to insonate +----------+------------+----------+-----------+------------------+ ACA                                        Unable to insonate +----------+------------+----------+-----------+------------------+ Term ICA                                   Unable to insonate +----------+------------+----------+-----------+------------------+ PCA                                        Unable to insonate +----------+------------+----------+-----------+------------------+ Opthalmic    25.00                 1.01                       +----------+------------+----------+-----------+------------------+ ICA siphon   23.00                 1.32                       +----------+------------+----------+-----------+------------------+ Vertebral    -23.00                0.97                       +----------+------------+----------+-----------+------------------+  +------------+-------+-------+             VM cm/sComment +------------+-------+-------+ Prox Basilar-26.00         +------------+-------+-------+ Dist Basilar-26.00         +------------+-------+-------+ Summary:  Absent bitemporal windows limit evaluation of anterior circulation greatly. Low normal mean flow velocities in remaining identified vessels of anterior and posterior cerebral circulation *See table(s) above for TCD measurements and observations.  Diagnosing physician: Antony Contras MD Electronically signed by Antony Contras MD on 03/12/2021 at 8:25:34 AM.    Final       Subjective: - no chest pain, shortness of breath, no abdominal pain, nausea or vomiting.   Discharge Exam: BP (!) 148/68 (BP Location: Left Arm)   Pulse 70   Temp 98 F (36.7 C) (Oral)    Resp 18   Ht 5\' 4"  (1.626 m)   Wt 81.6 kg   SpO2 98%   BMI 30.90 kg/m   General: Pt is alert, awake, not in acute distress Cardiovascular: Irregular, no edema Respiratory: CTA bilaterally,  no wheezing, no rhonchi Abdominal: Soft, NT, ND, bowel sounds + Extremities: no edema, no cyanosis   The results of significant diagnostics from this hospitalization (including imaging, microbiology, ancillary and laboratory) are listed below for reference.     Microbiology: Recent Results (from the past 240 hour(s))  Resp Panel by RT-PCR (Flu A&B, Covid) Nasopharyngeal Swab     Status: None   Collection Time: 03/10/21  6:51 PM   Specimen: Nasopharyngeal Swab; Nasopharyngeal(NP) swabs in vial transport medium  Result Value Ref Range Status   SARS Coronavirus 2 by RT PCR NEGATIVE NEGATIVE Final    Comment: (NOTE) SARS-CoV-2 target nucleic acids are NOT DETECTED.  The SARS-CoV-2 RNA is generally detectable in upper respiratory specimens during the acute phase of infection. The lowest concentration of SARS-CoV-2 viral copies this assay can detect is 138 copies/mL. A negative result does not preclude SARS-Cov-2 infection and should not be used as the sole basis for treatment or other patient management decisions. A negative result may occur with  improper specimen collection/handling, submission of specimen other than nasopharyngeal swab, presence of viral mutation(s) within the areas targeted by this assay, and inadequate number of viral copies(<138 copies/mL). A negative result must be combined with clinical observations, patient history, and epidemiological information. The expected result is Negative.  Fact Sheet for Patients:  EntrepreneurPulse.com.au  Fact Sheet for Healthcare Providers:  IncredibleEmployment.be  This test is no t yet approved or cleared by the [place] FDA and  has been authorized for detection and/or diagnosis of  SARS-CoV-2 by FDA under an Emergency Use Authorization (EUA). This EUA will remain  in effect (meaning this test can be used) for the duration of the COVID-19 declaration under Section 564(b)(1) of the Act, 21 U.S.C.section 360bbb-3(b)(1), unless the authorization is terminated  or revoked sooner.       Influenza A by PCR NEGATIVE NEGATIVE Final   Influenza B by PCR NEGATIVE NEGATIVE Final    Comment: (NOTE) The Xpert Xpress SARS-CoV-2/FLU/RSV plus assay is intended as an aid in the diagnosis of influenza from Nasopharyngeal swab specimens and should not be used as a sole basis for treatment. Nasal washings and aspirates are unacceptable for Xpert Xpress SARS-CoV-2/FLU/RSV testing.  Fact Sheet for Patients: EntrepreneurPulse.com.au  Fact Sheet for Healthcare Providers: IncredibleEmployment.be  This test is not yet approved or cleared by the [place] FDA and has been authorized for detection and/or diagnosis of SARS-CoV-2 by FDA under an Emergency Use Authorization (EUA). This EUA will remain in effect (meaning this test can be used) for the duration of the COVID-19 declaration under Section 564(b)(1) of the Act, 21 U.S.C. section 360bbb-3(b)(1), unless the authorization is terminated or revoked.  Performed at [facility], [place] [address] [place], [place] [address]      Labs: Basic Metabolic Panel: Recent Labs  Lab 03/10/21 1851 03/10/21 1857  NA 136 138  K 4.2 4.2  CL 105 106  CO2 25  --   GLUCOSE 183* 171*  BUN 22 26*  CREATININE 1.23* 1.10*  CALCIUM 10.8*  --    Liver Function Tests: Recent Labs  Lab 03/10/21 1851  AST 26  ALT 12  ALKPHOS 64  BILITOT 0.6  PROT 6.5  ALBUMIN 3.7   CBC: Recent Labs  Lab 03/10/21 1851 03/10/21 1857  WBC 7.6  --   NEUTROABS 5.0  --   HGB 12.5 13.3  HCT 39.7 39.0  MCV 109.4*  --   PLT 170  --  CBG: No results for input(s): GLUCAP in the last 168 hours. Hgb  A1c Recent Labs    03/11/21 0503  HGBA1C 5.3   Lipid Profile Recent Labs    03/11/21 0503  CHOL 219*  HDL 67  LDLCALC 117*  TRIG 176*  CHOLHDL 3.3   Thyroid function studies Recent Labs    03/11/21 0503  TSH 3.678   Urinalysis    Component Value Date/Time   COLORURINE YELLOW 03/10/2021 1842   APPEARANCEUR CLEAR 03/10/2021 1842   APPEARANCEUR Turbid (A) 05/17/2017 1357   LABSPEC 1.011 03/10/2021 1842   PHURINE 6.0 03/10/2021 1842   GLUCOSEU NEGATIVE 03/10/2021 1842   HGBUR NEGATIVE 03/10/2021 1842   BILIRUBINUR NEGATIVE 03/10/2021 1842   BILIRUBINUR Negative 05/17/2017 1357   [place] 03/10/2021 1842   PROTEINUR NEGATIVE 03/10/2021 1842   UROBILINOGEN 0.2 06/08/2014 1608   NITRITE NEGATIVE 03/10/2021 1842   LEUKOCYTESUR SMALL (A) 03/10/2021 1842    FURTHER DISCHARGE INSTRUCTIONS:   Get Medicines reviewed and adjusted: Please take all your medications with you for your next visit with your Primary MD   Laboratory/radiological data: Please request your Primary MD to go over all hospital tests and procedure/radiological results at the follow up, please ask your Primary MD to get all Hospital records sent to his/her office.   In some cases, they will be blood work, cultures and biopsy results pending at the time of your discharge. Please request that your primary care M.D. goes through all the records of your hospital data and follows up on these results.   Also Note the following: If you experience worsening of your admission symptoms, develop shortness of breath, life threatening emergency, suicidal or homicidal thoughts you must seek medical attention immediately by calling 911 or calling your MD immediately  if symptoms less severe.   You must read complete instructions/literature along with all the possible adverse reactions/side effects for all the Medicines you take and that have been prescribed to you. Take any new Medicines after you have completely  understood and accpet all the possible adverse reactions/side effects.    Do not drive when taking Pain medications or sleeping medications (Benzodaizepines)   Do not take more than prescribed Pain, Sleep and Anxiety Medications. It is not advisable to combine anxiety,sleep and pain medications without talking with your primary care practitioner   Special Instructions: If you have smoked or chewed Tobacco  in the last 2 yrs please stop smoking, stop any regular Alcohol  and or any Recreational drug use.   Wear Seat belts while driving.   Please note: You were cared for by a hospitalist during your hospital stay. Once you are discharged, your primary care physician will handle any further medical issues. Please note that NO REFILLS for any discharge medications will be authorized once you are discharged, as it is imperative that you return to your primary care physician (or establish a relationship with a primary care physician if you do not have one) for your post hospital discharge needs so that they can reassess your need for medications and monitor your lab values.  Time coordinating discharge: 35 minutes  SIGNED:  Marzetta Board, MD, PhD 03/12/2021, 11:03 AM

## 2021-03-12 NOTE — Progress Notes (Signed)
STROKE TEAM PROGRESS NOTE   SUBJECTIVE (INTERVAL HISTORY) RN is at the bedside. Pt is walking in room with walker to go to bathroom with RN. She has no complains. CT repeat neg. Resumed eliquis.     OBJECTIVE Temp:  [97.6 F (36.4 C)-98.4 F (36.9 C)] 98 F (36.7 C) (05/18 0805) Pulse Rate:  [69-70] 70 (05/18 0805) Cardiac Rhythm: Ventricular paced (05/18 0800) Resp:  [17-19] 18 (05/18 0805) BP: (132-158)/(68-97) 148/68 (05/18 0805) SpO2:  [96 %-98 %] 98 % (05/18 0805)  No results for input(s): GLUCAP in the last 168 hours. Recent Labs  Lab 03/10/21 1851 03/10/21 1857  NA 136 138  K 4.2 4.2  CL 105 106  CO2 25  --   GLUCOSE 183* 171*  BUN 22 26*  CREATININE 1.23* 1.10*  CALCIUM 10.8*  --    Recent Labs  Lab 03/10/21 1851  AST 26  ALT 12  ALKPHOS 64  BILITOT 0.6  PROT 6.5  ALBUMIN 3.7   Recent Labs  Lab 03/10/21 1851 03/10/21 1857  WBC 7.6  --   NEUTROABS 5.0  --   HGB 12.5 13.3  HCT 39.7 39.0  MCV 109.4*  --   PLT 170  --    No results for input(s): CKTOTAL, CKMB, CKMBINDEX, TROPONINI in the last 168 hours. Recent Labs    03/10/21 1851  LABPROT 16.9*  INR 1.4*   Recent Labs    03/10/21 1842  COLORURINE YELLOW  LABSPEC 1.011  PHURINE 6.0  GLUCOSEU NEGATIVE  HGBUR NEGATIVE  BILIRUBINUR NEGATIVE  KETONESUR NEGATIVE  PROTEINUR NEGATIVE  NITRITE NEGATIVE  LEUKOCYTESUR SMALL*       Component Value Date/Time   CHOL 219 (H) 03/11/2021 0503   TRIG 176 (H) 03/11/2021 0503   HDL 67 03/11/2021 0503   CHOLHDL 3.3 03/11/2021 0503   VLDL 35 03/11/2021 0503   LDLCALC 117 (H) 03/11/2021 0503   Lab Results  Component Value Date   HGBA1C 5.3 03/11/2021      Component Value Date/Time   LABOPIA NONE DETECTED 03/10/2021 1842   COCAINSCRNUR NONE DETECTED 03/10/2021 1842   LABBENZ NONE DETECTED 03/10/2021 1842   AMPHETMU NONE DETECTED 03/10/2021 1842   THCU NONE DETECTED 03/10/2021 1842   LABBARB NONE DETECTED 03/10/2021 1842    Recent Labs  Lab  03/10/21 1842  ETH <10    I have personally reviewed the radiological images below and agree with the radiology interpretations.  CT HEAD WO CONTRAST  Result Date: 03/12/2021 CLINICAL DATA:  87 year old female with recent slurred speech and dizziness. TIA EXAM: CT HEAD WITHOUT CONTRAST TECHNIQUE: Contiguous axial images were obtained from the base of the skull through the vertex without intravenous contrast. COMPARISON:  Head CT 03/10/2021 and earlier. FINDINGS: Brain: No midline shift, ventriculomegaly, mass effect, evidence of mass lesion, intracranial hemorrhage or evidence of cortically based acute infarction. Patchy, scattered bilateral white matter hypodensity appears stable from the recent CT. Stable dystrophic calcification left basal ganglia. No cortical encephalomalacia identified. Vascular: Calcified atherosclerosis at the skull base. No suspicious intracranial vascular hyperdensity. Skull: No acute osseous abnormality identified. Sinuses/Orbits: Visualized paranasal sinuses and mastoids are clear. Other: Visualized orbits and scalp soft tissues are within normal limits. IMPRESSION: Stable non contrast CT appearance of the brain with moderate for age nonspecific white matter changes. No acute intracranial abnormality. Electronically Signed   By: Genevie Ann M.D.   On: 03/12/2021 06:23   CT Head Wo Contrast  Result Date: 03/10/2021 CLINICAL DATA:  Slurred  speech and dizziness EXAM: CT HEAD WITHOUT CONTRAST TECHNIQUE: Contiguous axial images were obtained from the base of the skull through the vertex without intravenous contrast. COMPARISON:  02/22/2017 FINDINGS: Brain: No evidence of acute infarction, hemorrhage, hydrocephalus, extra-axial collection or mass lesion/mass effect. Chronic atrophic and ischemic changes are noted. Vascular: No hyperdense vessel or unexpected calcification. Skull: Normal. Negative for fracture or focal lesion. Sinuses/Orbits: No acute finding. Other: None. IMPRESSION:  Chronic atrophic and ischemic changes. No acute abnormality is noted. Electronically Signed   By: Inez Catalina M.D.   On: 03/10/2021 20:06   VAS US CAROTID  Result Date: 03/11/2021 Carotid Arterial Duplex Study Patient Name:  Molly [name]  Date of Exam:   03/11/2021 Medical Rec #: [id number]          Accession #:    [id number] Date of Birth: 06-Mar-1933          Patient Gender: F Patient Age:   40Y Exam Location:  [facility] Procedure:      VAS US CAROTID Referring Phys: [id number] VASUNDHRA RATHORE --------------------------------------------------------------------------------  Indications:       TIA. Comparison Study:  No prior studies. Performing Technologist: Darlin Coco RDMS,RVT  Examination Guidelines: A complete evaluation includes B-mode imaging, spectral Doppler, color Doppler, and power Doppler as needed of all accessible portions of each vessel. Bilateral testing is considered an integral part of a complete examination. Limited examinations for reoccurring indications may be performed as noted.  Right Carotid Findings: +----------+--------+--------+--------+------------------+--------+           PSV cm/sEDV cm/sStenosisPlaque DescriptionComments +----------+--------+--------+--------+------------------+--------+ CCA Prox  79      16                                         +----------+--------+--------+--------+------------------+--------+ CCA Distal51      17              focal and calcific         +----------+--------+--------+--------+------------------+--------+ ICA Prox  56      17      1-39%                              +----------+--------+--------+--------+------------------+--------+ ICA Distal79      25                                         +----------+--------+--------+--------+------------------+--------+ ECA       45      9                                          +----------+--------+--------+--------+------------------+--------+  +----------+--------+-------+----------------+-------------------+           PSV cm/sEDV cmsDescribe        Arm Pressure (mmHG) +----------+--------+-------+----------------+-------------------+ [id number]             Multiphasic, WNL                    +----------+--------+-------+----------------+-------------------+ +---------+--------+--+--------+--+---------+ VertebralPSV cm/s40EDV cm/s13Antegrade +---------+--------+--+--------+--+---------+  Left Carotid Findings: +----------+--------+--------+--------+------------------+--------+           PSV cm/sEDV cm/sStenosisPlaque DescriptionComments +----------+--------+--------+--------+------------------+--------+ CCA Prox  60  19                                         +----------+--------+--------+--------+------------------+--------+ CCA Distal64      19                                         +----------+--------+--------+--------+------------------+--------+ ICA Prox  43      16      1-39%   heterogenous               +----------+--------+--------+--------+------------------+--------+ ICA Distal74      28                                         +----------+--------+--------+--------+------------------+--------+ ECA       64      12                                         +----------+--------+--------+--------+------------------+--------+ +----------+--------+--------+----------------+-------------------+           PSV cm/sEDV cm/sDescribe        Arm Pressure (mmHG) +----------+--------+--------+----------------+-------------------+ [id number]              Multiphasic, WNL                    +----------+--------+--------+----------------+-------------------+ +---------+--------+--+--------+--+---------+ VertebralPSV cm/s73EDV cm/s22Antegrade +---------+--------+--+--------+--+---------+   Summary: Right Carotid: Velocities in the right ICA are consistent with a 1-39%  stenosis. Left Carotid: Velocities in the left ICA are consistent with a 1-39% stenosis. Vertebrals:  Bilateral vertebral arteries demonstrate antegrade flow. Subclavians: Normal flow hemodynamics were seen in bilateral subclavian              arteries. *See table(s) above for measurements and observations.  Electronically signed by Antony Contras MD on 03/11/2021 at 1:25:59 PM.    Final    VAS [place] TRANSCRANIAL DOPPLER  Result Date: 03/12/2021  Transcranial Doppler Patient Name:  Molly [name]  Date of Exam:   03/11/2021 Medical Rec #: [id number]          Accession #:    [id number] Date of Birth: 06-27-1933          Patient Gender: F Patient Age:   65Y Exam Location:  [facility] Procedure:      VAS [place] TRANSCRANIAL DOPPLER Referring Phys: [id number] Rosalin Hawking --------------------------------------------------------------------------------  Indications: TIA -evaluate for ischemia. Limitations for diagnostic windows: Unable to insonate right transtemporal window. Unable to insonate left transtemporal window. Comparison Study: No prior studies. Performing Technologist: Rogelia Rohrer Supporting Technologist: Darlin Coco RDMS,RVT  Examination Guidelines: A complete evaluation includes B-mode imaging, spectral Doppler, color Doppler, and power Doppler as needed of all accessible portions of each vessel. Bilateral testing is considered an integral part of a complete examination. Limited examinations for reoccurring indications may be performed as noted.  +----------+-------------+----------+-----------+------------------+ RIGHT TCD Right VM (cm)Depth (cm)Pulsatility     Comment       +----------+-------------+----------+-----------+------------------+ MCA  Unable to insonate +----------+-------------+----------+-----------+------------------+ ACA                                         Unable to insonate  +----------+-------------+----------+-----------+------------------+ Term ICA                                    Unable to insonate +----------+-------------+----------+-----------+------------------+ PCA                                         Unable to insonate +----------+-------------+----------+-----------+------------------+ Opthalmic     23.00                 1.00                       +----------+-------------+----------+-----------+------------------+ ICA siphon    35.00                 1.04                       +----------+-------------+----------+-----------+------------------+ Vertebral    -28.00                 1.26                       +----------+-------------+----------+-----------+------------------+  +----------+------------+----------+-----------+------------------+ LEFT TCD  Left VM (cm)Depth (cm)Pulsatility     Comment       +----------+------------+----------+-----------+------------------+ MCA                                        Unable to insonate +----------+------------+----------+-----------+------------------+ ACA                                        Unable to insonate +----------+------------+----------+-----------+------------------+ Term ICA                                   Unable to insonate +----------+------------+----------+-----------+------------------+ PCA                                        Unable to insonate +----------+------------+----------+-----------+------------------+ Opthalmic    25.00                 1.01                       +----------+------------+----------+-----------+------------------+ ICA siphon   23.00                 1.32                       +----------+------------+----------+-----------+------------------+ Vertebral    -23.00                0.97                       +----------+------------+----------+-----------+------------------+  +------------+-------+-------+  VM cm/sComment +------------+-------+-------+ Prox Basilar-26.00         +------------+-------+-------+ Dist Basilar-26.00         +------------+-------+-------+ Summary:  Absent bitemporal windows limit evaluation of anterior circulation greatly. Low normal mean flow velocities in remaining identified vessels of anterior and posterior cerebral circulation *See table(s) above for TCD measurements and observations.  Diagnosing physician: Antony Contras MD Electronically signed by Antony Contras MD on 03/12/2021 at 8:25:34 AM.    Final     PHYSICAL EXAM  Temp:  [97.6 F (36.4 C)-98.4 F (36.9 C)] 98 F (36.7 C) (05/18 0805) Pulse Rate:  [69-70] 70 (05/18 0805) Resp:  [17-19] 18 (05/18 0805) BP: (132-158)/(68-97) 148/68 (05/18 0805) SpO2:  [96 %-98 %] 98 % (05/18 0805)  General - Well nourished, well developed, in no apparent distress. Bruise on the right face due to recent skin surgery  Ophthalmologic - fundi not visualized due to noncooperation.  Cardiovascular - irregularly irregular heart rate and rhythm.  Mental Status -  Level of arousal and orientation to time, place, and person were intact. Language including expression, naming, repetition, comprehension was assessed and found intact. Fund of Knowledge was assessed and was intact.  Cranial Nerves II - XII - II - Visual field intact OU. III, IV, VI - Extraocular movements intact. V - Facial sensation intact bilaterally. VII - Facial movement intact bilaterally. VIII - Hearing & vestibular intact bilaterally. X - Palate elevates symmetrically. XI - Chin turning & shoulder shrug intact bilaterally. XII - Tongue protrusion intact.  Motor Strength - The patient's strength was normal in all extremities and pronator drift was absent.  Bulk was normal and fasciculations were absent.   Motor Tone - Muscle tone was assessed at the neck and appendages and was normal.  Reflexes - The patient's reflexes were symmetrical  in all extremities and she had no pathological reflexes.  Sensory - Light touch, temperature/pinprick were assessed and were symmetrical.    Coordination - The patient had normal movements in the hands with no ataxia or dysmetria.  Tremor was absent.  Gait and Station - deferred.   ASSESSMENT/PLAN Ms. CAELI LINEHAN is a 87 y.o. female with history of afib on eliquis, HTN, pacemaker, CHF, CKD admitted for confusion and aphasia. No tPA given due to symptoms resolved. Pt denies HA or hx of migraine. No LOC  TIA:  left brain TIA with transient aphasia. No HA or hx of migraine and less likely for seizure  CT no acute finding  MRI not compatible with pacer  CT repeat neg  Carotid Doppler unremarkable  TCD unremarkable although lack of b/l transtemporal windows  2D Echo  12/2020 EF 40-45%  LDL 117  HgbA1c 5.3  SCDs for VTE prophylaxis  Eliquis (apixaban) daily prior to admission, now on Eliquis (apixaban) daily. Continue on discharge.   Patient counseled to be compliant with her antithrombotic medications  Ongoing aggressive stroke risk factor management  Therapy recommendations:  HH PT  Disposition:  pending  Hypertension . Stable  Long term BP goal normotensive  Hyperlipidemia  Home meds:  crestor 69   Follows with Dr. Radford Pax cardiology  LDL 117, goal < 70  Now on crestor 10  Continue statin at discharge  Other Stroke Risk Factors  Advanced age  CHF  Other Active Problems  Pacemaker  Skin cancer s/p surgery  Hospital day # 0  Neurology will sign off. Please call with questions. Pt will follow up with stroke clinic NP at [facility] in  about 4 weeks. Thanks for the consult.   Rosalin Hawking, MD PhD Stroke Neurology 03/12/2021 11:46 AM    To contact Stroke Continuity provider, please refer to [email or web address] After hours, contact General Neurology

## 2021-03-12 NOTE — Progress Notes (Signed)
RN gave pt discharge instructions and she stated understanding. IV has been removed and belongings have been packed.

## 2021-03-13 DIAGNOSIS — I130 Hypertensive heart and chronic kidney disease with heart failure and stage 1 through stage 4 chronic kidney disease, or unspecified chronic kidney disease: Secondary | ICD-10-CM | POA: Diagnosis not present

## 2021-03-13 DIAGNOSIS — K219 Gastro-esophageal reflux disease without esophagitis: Secondary | ICD-10-CM | POA: Diagnosis not present

## 2021-03-13 DIAGNOSIS — C44300 Unspecified malignant neoplasm of skin of unspecified part of face: Secondary | ICD-10-CM | POA: Diagnosis not present

## 2021-03-13 DIAGNOSIS — Z89421 Acquired absence of other right toe(s): Secondary | ICD-10-CM | POA: Diagnosis not present

## 2021-03-13 DIAGNOSIS — M519 Unspecified thoracic, thoracolumbar and lumbosacral intervertebral disc disorder: Secondary | ICD-10-CM | POA: Diagnosis not present

## 2021-03-13 DIAGNOSIS — I447 Left bundle-branch block, unspecified: Secondary | ICD-10-CM | POA: Diagnosis not present

## 2021-03-13 DIAGNOSIS — I080 Rheumatic disorders of both mitral and aortic valves: Secondary | ICD-10-CM | POA: Diagnosis not present

## 2021-03-13 DIAGNOSIS — I428 Other cardiomyopathies: Secondary | ICD-10-CM | POA: Diagnosis not present

## 2021-03-13 DIAGNOSIS — E039 Hypothyroidism, unspecified: Secondary | ICD-10-CM | POA: Diagnosis not present

## 2021-03-13 DIAGNOSIS — Z7901 Long term (current) use of anticoagulants: Secondary | ICD-10-CM | POA: Diagnosis not present

## 2021-03-13 DIAGNOSIS — M1711 Unilateral primary osteoarthritis, right knee: Secondary | ICD-10-CM | POA: Diagnosis not present

## 2021-03-13 DIAGNOSIS — I4821 Permanent atrial fibrillation: Secondary | ICD-10-CM | POA: Diagnosis not present

## 2021-03-13 DIAGNOSIS — E7800 Pure hypercholesterolemia, unspecified: Secondary | ICD-10-CM | POA: Diagnosis not present

## 2021-03-13 DIAGNOSIS — Z950 Presence of cardiac pacemaker: Secondary | ICD-10-CM | POA: Diagnosis not present

## 2021-03-13 DIAGNOSIS — Z89422 Acquired absence of other left toe(s): Secondary | ICD-10-CM | POA: Diagnosis not present

## 2021-03-13 DIAGNOSIS — Z483 Aftercare following surgery for neoplasm: Secondary | ICD-10-CM | POA: Diagnosis not present

## 2021-03-13 DIAGNOSIS — I495 Sick sinus syndrome: Secondary | ICD-10-CM | POA: Diagnosis not present

## 2021-03-13 DIAGNOSIS — I420 Dilated cardiomyopathy: Secondary | ICD-10-CM | POA: Diagnosis not present

## 2021-03-13 DIAGNOSIS — R69 Illness, unspecified: Secondary | ICD-10-CM | POA: Diagnosis not present

## 2021-03-13 DIAGNOSIS — N183 Chronic kidney disease, stage 3 unspecified: Secondary | ICD-10-CM | POA: Diagnosis not present

## 2021-03-13 DIAGNOSIS — R32 Unspecified urinary incontinence: Secondary | ICD-10-CM | POA: Diagnosis not present

## 2021-03-13 DIAGNOSIS — E213 Hyperparathyroidism, unspecified: Secondary | ICD-10-CM | POA: Diagnosis not present

## 2021-03-13 DIAGNOSIS — I5032 Chronic diastolic (congestive) heart failure: Secondary | ICD-10-CM | POA: Diagnosis not present

## 2021-03-13 DIAGNOSIS — L299 Pruritus, unspecified: Secondary | ICD-10-CM | POA: Diagnosis not present

## 2021-03-18 DIAGNOSIS — I420 Dilated cardiomyopathy: Secondary | ICD-10-CM | POA: Diagnosis not present

## 2021-03-18 DIAGNOSIS — R69 Illness, unspecified: Secondary | ICD-10-CM | POA: Diagnosis not present

## 2021-03-18 DIAGNOSIS — I130 Hypertensive heart and chronic kidney disease with heart failure and stage 1 through stage 4 chronic kidney disease, or unspecified chronic kidney disease: Secondary | ICD-10-CM | POA: Diagnosis not present

## 2021-03-18 DIAGNOSIS — E213 Hyperparathyroidism, unspecified: Secondary | ICD-10-CM | POA: Diagnosis not present

## 2021-03-18 DIAGNOSIS — Z483 Aftercare following surgery for neoplasm: Secondary | ICD-10-CM | POA: Diagnosis not present

## 2021-03-18 DIAGNOSIS — E7800 Pure hypercholesterolemia, unspecified: Secondary | ICD-10-CM | POA: Diagnosis not present

## 2021-03-18 DIAGNOSIS — N183 Chronic kidney disease, stage 3 unspecified: Secondary | ICD-10-CM | POA: Diagnosis not present

## 2021-03-18 DIAGNOSIS — L299 Pruritus, unspecified: Secondary | ICD-10-CM | POA: Diagnosis not present

## 2021-03-18 DIAGNOSIS — I447 Left bundle-branch block, unspecified: Secondary | ICD-10-CM | POA: Diagnosis not present

## 2021-03-18 DIAGNOSIS — I4821 Permanent atrial fibrillation: Secondary | ICD-10-CM | POA: Diagnosis not present

## 2021-03-18 DIAGNOSIS — R32 Unspecified urinary incontinence: Secondary | ICD-10-CM | POA: Diagnosis not present

## 2021-03-18 DIAGNOSIS — Z7901 Long term (current) use of anticoagulants: Secondary | ICD-10-CM | POA: Diagnosis not present

## 2021-03-18 DIAGNOSIS — Z950 Presence of cardiac pacemaker: Secondary | ICD-10-CM | POA: Diagnosis not present

## 2021-03-18 DIAGNOSIS — M1711 Unilateral primary osteoarthritis, right knee: Secondary | ICD-10-CM | POA: Diagnosis not present

## 2021-03-18 DIAGNOSIS — E039 Hypothyroidism, unspecified: Secondary | ICD-10-CM | POA: Diagnosis not present

## 2021-03-18 DIAGNOSIS — M519 Unspecified thoracic, thoracolumbar and lumbosacral intervertebral disc disorder: Secondary | ICD-10-CM | POA: Diagnosis not present

## 2021-03-18 DIAGNOSIS — I5032 Chronic diastolic (congestive) heart failure: Secondary | ICD-10-CM | POA: Diagnosis not present

## 2021-03-18 DIAGNOSIS — Z89422 Acquired absence of other left toe(s): Secondary | ICD-10-CM | POA: Diagnosis not present

## 2021-03-18 DIAGNOSIS — C44300 Unspecified malignant neoplasm of skin of unspecified part of face: Secondary | ICD-10-CM | POA: Diagnosis not present

## 2021-03-18 DIAGNOSIS — I428 Other cardiomyopathies: Secondary | ICD-10-CM | POA: Diagnosis not present

## 2021-03-18 DIAGNOSIS — Z89421 Acquired absence of other right toe(s): Secondary | ICD-10-CM | POA: Diagnosis not present

## 2021-03-18 DIAGNOSIS — I080 Rheumatic disorders of both mitral and aortic valves: Secondary | ICD-10-CM | POA: Diagnosis not present

## 2021-03-18 DIAGNOSIS — K219 Gastro-esophageal reflux disease without esophagitis: Secondary | ICD-10-CM | POA: Diagnosis not present

## 2021-03-18 DIAGNOSIS — I495 Sick sinus syndrome: Secondary | ICD-10-CM | POA: Diagnosis not present

## 2021-03-19 DIAGNOSIS — Z89421 Acquired absence of other right toe(s): Secondary | ICD-10-CM | POA: Diagnosis not present

## 2021-03-19 DIAGNOSIS — L299 Pruritus, unspecified: Secondary | ICD-10-CM | POA: Diagnosis not present

## 2021-03-19 DIAGNOSIS — R32 Unspecified urinary incontinence: Secondary | ICD-10-CM | POA: Diagnosis not present

## 2021-03-19 DIAGNOSIS — I130 Hypertensive heart and chronic kidney disease with heart failure and stage 1 through stage 4 chronic kidney disease, or unspecified chronic kidney disease: Secondary | ICD-10-CM | POA: Diagnosis not present

## 2021-03-19 DIAGNOSIS — M519 Unspecified thoracic, thoracolumbar and lumbosacral intervertebral disc disorder: Secondary | ICD-10-CM | POA: Diagnosis not present

## 2021-03-19 DIAGNOSIS — Z483 Aftercare following surgery for neoplasm: Secondary | ICD-10-CM | POA: Diagnosis not present

## 2021-03-19 DIAGNOSIS — Z89422 Acquired absence of other left toe(s): Secondary | ICD-10-CM | POA: Diagnosis not present

## 2021-03-19 DIAGNOSIS — I080 Rheumatic disorders of both mitral and aortic valves: Secondary | ICD-10-CM | POA: Diagnosis not present

## 2021-03-19 DIAGNOSIS — E7800 Pure hypercholesterolemia, unspecified: Secondary | ICD-10-CM | POA: Diagnosis not present

## 2021-03-19 DIAGNOSIS — Z950 Presence of cardiac pacemaker: Secondary | ICD-10-CM | POA: Diagnosis not present

## 2021-03-19 DIAGNOSIS — I447 Left bundle-branch block, unspecified: Secondary | ICD-10-CM | POA: Diagnosis not present

## 2021-03-19 DIAGNOSIS — I4821 Permanent atrial fibrillation: Secondary | ICD-10-CM | POA: Diagnosis not present

## 2021-03-19 DIAGNOSIS — R69 Illness, unspecified: Secondary | ICD-10-CM | POA: Diagnosis not present

## 2021-03-19 DIAGNOSIS — E039 Hypothyroidism, unspecified: Secondary | ICD-10-CM | POA: Diagnosis not present

## 2021-03-19 DIAGNOSIS — K219 Gastro-esophageal reflux disease without esophagitis: Secondary | ICD-10-CM | POA: Diagnosis not present

## 2021-03-19 DIAGNOSIS — C44300 Unspecified malignant neoplasm of skin of unspecified part of face: Secondary | ICD-10-CM | POA: Diagnosis not present

## 2021-03-19 DIAGNOSIS — I420 Dilated cardiomyopathy: Secondary | ICD-10-CM | POA: Diagnosis not present

## 2021-03-19 DIAGNOSIS — N183 Chronic kidney disease, stage 3 unspecified: Secondary | ICD-10-CM | POA: Diagnosis not present

## 2021-03-19 DIAGNOSIS — M1711 Unilateral primary osteoarthritis, right knee: Secondary | ICD-10-CM | POA: Diagnosis not present

## 2021-03-19 DIAGNOSIS — Z7901 Long term (current) use of anticoagulants: Secondary | ICD-10-CM | POA: Diagnosis not present

## 2021-03-19 DIAGNOSIS — E213 Hyperparathyroidism, unspecified: Secondary | ICD-10-CM | POA: Diagnosis not present

## 2021-03-19 DIAGNOSIS — I5032 Chronic diastolic (congestive) heart failure: Secondary | ICD-10-CM | POA: Diagnosis not present

## 2021-03-19 DIAGNOSIS — I428 Other cardiomyopathies: Secondary | ICD-10-CM | POA: Diagnosis not present

## 2021-03-19 DIAGNOSIS — I495 Sick sinus syndrome: Secondary | ICD-10-CM | POA: Diagnosis not present

## 2021-03-21 DIAGNOSIS — R5382 Chronic fatigue, unspecified: Secondary | ICD-10-CM | POA: Diagnosis not present

## 2021-03-21 DIAGNOSIS — Z8673 Personal history of transient ischemic attack (TIA), and cerebral infarction without residual deficits: Secondary | ICD-10-CM | POA: Diagnosis not present

## 2021-03-21 DIAGNOSIS — N183 Chronic kidney disease, stage 3 unspecified: Secondary | ICD-10-CM | POA: Diagnosis not present

## 2021-03-21 DIAGNOSIS — R69 Illness, unspecified: Secondary | ICD-10-CM | POA: Diagnosis not present

## 2021-03-21 DIAGNOSIS — I4891 Unspecified atrial fibrillation: Secondary | ICD-10-CM | POA: Diagnosis not present

## 2021-03-25 ENCOUNTER — Ambulatory Visit: Payer: Medicare HMO | Admitting: Cardiology

## 2021-03-25 ENCOUNTER — Encounter: Payer: Self-pay | Admitting: Cardiology

## 2021-03-25 ENCOUNTER — Other Ambulatory Visit: Payer: Self-pay

## 2021-03-25 VITALS — BP 122/80 | HR 69 | Ht 64.0 in | Wt 183.0 lb

## 2021-03-25 DIAGNOSIS — I10 Essential (primary) hypertension: Secondary | ICD-10-CM | POA: Diagnosis not present

## 2021-03-25 DIAGNOSIS — I493 Ventricular premature depolarization: Secondary | ICD-10-CM

## 2021-03-25 DIAGNOSIS — I351 Nonrheumatic aortic (valve) insufficiency: Secondary | ICD-10-CM | POA: Diagnosis not present

## 2021-03-25 DIAGNOSIS — E7800 Pure hypercholesterolemia, unspecified: Secondary | ICD-10-CM | POA: Diagnosis not present

## 2021-03-25 DIAGNOSIS — I420 Dilated cardiomyopathy: Secondary | ICD-10-CM

## 2021-03-25 DIAGNOSIS — I4821 Permanent atrial fibrillation: Secondary | ICD-10-CM | POA: Diagnosis not present

## 2021-03-25 DIAGNOSIS — N1831 Chronic kidney disease, stage 3a: Secondary | ICD-10-CM

## 2021-03-25 DIAGNOSIS — I5042 Chronic combined systolic (congestive) and diastolic (congestive) heart failure: Secondary | ICD-10-CM | POA: Diagnosis not present

## 2021-03-25 DIAGNOSIS — I340 Nonrheumatic mitral (valve) insufficiency: Secondary | ICD-10-CM

## 2021-03-25 DIAGNOSIS — I495 Sick sinus syndrome: Secondary | ICD-10-CM | POA: Diagnosis not present

## 2021-03-25 DIAGNOSIS — R5383 Other fatigue: Secondary | ICD-10-CM

## 2021-03-25 MED ORDER — FUROSEMIDE 20 MG PO TABS: 20.0000 mg | ORAL_TABLET | Freq: Every day | ORAL | 3 refills | Status: AC

## 2021-03-25 MED ORDER — APIXABAN 5 MG PO TABS: 5.0000 mg | ORAL_TABLET | Freq: Two times a day (BID) | ORAL | 11 refills | Status: DC

## 2021-03-25 NOTE — Addendum Note (Signed)
Addended by: Antonieta Iba on: 03/25/2021 03:54 PM   Modules accepted: Orders

## 2021-03-25 NOTE — Patient Instructions (Signed)

## 2021-03-25 NOTE — Progress Notes (Signed)
Date:  03/25/2021   ID:  Molly [name], DOB Dec 09, 1932, MRN [id number]  PCP:  Harlan Stains, MD  Cardiologist:  Fransico Him, MD  Electrophysiologist:  None   Chief Complaint:  CHF  History of Present Illness:    Molly [name] is a 87 y.o. female with a hx of chronic systolic and diastolic CHF (presumed NICM),tachy-brady syndrome s/p PPM implant 2014, persistent atrial fibrillations/p recent AVN ablation,CKD stage III, hyperparathyroidism, pacing induced LBBB s/p [employer]-Pupgradeon 09/27/2019, chronic afib presents for LE edema.  She has long history of atrial fib going back many years and required PPM as above. In 2017 her echo showed reduction in EF to 40-45% with mild AI and mild-moderate MR, so nuclear stress test was pursued which showed no ischemia. Most recent echocardiogram in 05/26/19 showed further decline in EF to 30-35%, normal RVSP, moderate LAE, mild RAE, moderate mitral regurgitation, mild aortic regurgitation.Her afib has been difficult to control and she has been followed by EP and the afib clinic. She failedamiodarone, Tikosyn and sotalol in the past.She wasnot felt to be a good candidate for afib ablation given her severely dilated LA.   She was admitted 05/25/19 with worsening fatigue, dyspnea, and weight gain with mildly elevated BNP and uncontrolled atrial arrhythmias. She was treated with IV Lasix and underwentAV nodeablation 8/3/20and therefore HR is now dictated by her pacemaker. Her Lasix was stopped due to AKI. She has history of hyperkalemia with ACEI, ARB, and spironolactone and blood pressure has otherwise been prohibitive of aggressive med titration.She continued to volume overload despite above tx. Eventually underwentBoston ScientificCRT-Pupgradeon 09/27/2019. She was also evaluated by Pulmonary for cough.   Called 10/01/19 with increased LE edema. Increased lasix. Seen in office 10/06/19  for further evaluation.No  improvement. Had edema on LLE with abdominal tightness and orthopnea and admitted to being compliant with medication and low sodium but had  about 8lb weight gain.Her lasix increased to 40 BID, and K+ 10 meq daily Cr up very slightly.  Seen back 11/02/2019 and weight down 20lbs and she was feeling weak and tired.  LE edema had resolved.  Lasix decreased to 40mg  daily and Kdur adjusted.   Last seen by Dr. [name] 12/2019 and QRS was much improved after BiV upgrade.    She is here today for followup and is doing well.  Since I last saw her she was admitted with a TIA.  She has chronic fatigue that wears her out when she exerts herself.  She denies any chest pain or pressure, PND, orthopnea, dizziness, palpitations or syncope. Occasionally she will have some LLE edema from a recent fall.  She is compliant with her meds and is tolerating meds with no SE.    Prior CV studies:   The following studies were reviewed today:  None  Past Medical History:  Diagnosis Date  . Aortic insufficiency   . Atrial tachycardia ([place])   . Cancer ([place])    Skin cancer- basal.  1 mylenoma  . Chronic combined systolic and diastolic CHF (congestive heart failure) ([place])   . CKD (chronic kidney disease), stage III ([place])   . Complication of anesthesia   . DCM (dilated cardiomyopathy) ([place])    EF 40-45% by echo 2017 - nuclear stress test with no ischemia  . GERD (gastroesophageal reflux disease)   . H/O benign essential tremor    on amiodarone resolved off amio  . H/O cardiac radiofrequency ablation    a. AV node ablation in 05/2019.  Marland Kitchen  H/O hyperkalemia    Resolved off ACE inhibitors  . High cholesterol   . History of blood transfusion   . Hyperkalemia   . Hyperparathyroidism ([place])   . Hypertension   . Mitral regurgitation    trivial by echo 12/2020  . Persistent atrial fibrillation ([place])   . PONV (postoperative nausea and vomiting)   . PVC's (premature ventricular contractions)   . Tachycardia-bradycardia  syndrome [facility])    s/p PPM 02/2013   Past Surgical History:  Procedure Laterality Date  . AMPUTATION Right 11/27/2014   Procedure: RIGHT 2ND AND 3RD TOE AMPUTATION;  Surgeon: Wylene Simmer, MD;  Location: [place];  Service: Orthopedics;  Laterality: Right;  . ATRIAL FIBRILLATION ABLATION N/A 05/29/2019   Procedure: AVN ABLATION;  Surgeon: Evans Lance, MD;  Location: [place] CV LAB;  Service: Cardiovascular;  Laterality: N/A;  . BIV UPGRADE N/A 09/27/2019   Procedure: BIV UPGRADE;  Surgeon: Evans Lance, MD;  Location: [place] CV LAB;  Service: Cardiovascular;  Laterality: N/A;  . BUNIONECTOMY     Right  . CARDIAC CATHETERIZATION  09/25/2000   EF of 50% -- with normal left ventricular size and function  . CARDIOVERSION N/A 12/28/2017   Procedure: CARDIOVERSION;  Surgeon: Dorothy Spark, MD;  Location: [facility] ENDOSCOPY;  Service: Cardiovascular;  Laterality: N/A;  . CARDIOVERSION N/A 02/25/2018   Procedure: CARDIOVERSION;  Surgeon: Skeet Latch, MD;  Location: [place];  Service: Cardiovascular;  Laterality: N/A;  . CESAREAN SECTION    . CESAREAN SECTION    . EYE SURGERY Bilateral    Cataract  . FOOT SURGERY    . INSERT / REPLACE / REMOVE PACEMAKER  02/2013  . PACEMAKER INSERTION  02/2013  . PERMANENT PACEMAKER INSERTION N/A 03/02/2013   Procedure: PERMANENT PACEMAKER INSERTION;  Surgeon: Evans Lance, MD;  Location: [facility] CATH LAB;  Service: Cardiovascular;  Laterality: N/A;  . SHOULDER ARTHROSCOPY W/ ROTATOR CUFF REPAIR    . SHOULDER SURGERY Right    roto cuff  . TOE AMPUTATION Left    2nd and 3rd, bunion under toes.  . TONSILLECTOMY    . VARICOSE VEIN SURGERY       Current Meds  Medication Sig  . acetaminophen (TYLENOL) 500 MG tablet Take 1,000 mg by mouth 2 (two) times daily. For pain  . apixaban (ELIQUIS) 5 MG TABS tablet Take 1 tablet (5 mg total) by mouth 2 (two) times daily.  . dorzolamide-timolol (COSOPT) 22.3-6.8 MG/ML ophthalmic solution Place 1 drop into both  eyes 2 (two) times daily.  . folic acid (FOLVITE) 1 MG tablet Take 1 tablet by mouth as directed. 6x a week Monday,Wednesday,Thursday,Friday,Saturday and Sunday.  . furosemide (LASIX) 40 MG tablet Take 40 mg by mouth daily. Some days patient takes lasix 20 mg if she has to leave the hose and is not going to be home.  . levocetirizine (XYZAL) 5 MG tablet Take 1 tablet by mouth daily as needed for allergies.  Marland Kitchen levothyroxine (SYNTHROID) 25 MCG tablet Take 25 mcg by mouth daily before breakfast.  . LORazepam (ATIVAN) 0.5 MG tablet TAKE 1 TABLET (0.5 MG TOTAL) BY MOUTH AT BEDTIME.  . methotrexate (RHEUMATREX) 2.5 MG tablet Take 5 mg by mouth every Tuesday.  Marland Kitchen omeprazole (PRILOSEC) 20 MG capsule Take 20 mg by mouth daily.  . rosuvastatin (CRESTOR) 10 MG tablet Take 1 tablet (10 mg total) by mouth daily.     Allergies:   Ace inhibitors, Atorvastatin, Diltiazem hcl, Duloxetine hcl, Lisinopril, Losartan,  Tramadol hcl, Codeine, and Tramadol   Social History   Tobacco Use  . Smoking status: Never Smoker  . Smokeless tobacco: Never Used  Vaping Use  . Vaping Use: Never used  Substance Use Topics  . Alcohol use: No  . Drug use: No     Family Hx: The patient's family history includes Breast cancer in her sister; Hypertension in her father and mother. There is no history of Hypercalcemia.  ROS:   Please see the history of present illness.     All other systems reviewed and are negative.   Labs/Other Tests and Data Reviewed:    Recent Labs: 12/11/2020: NT-Pro BNP 844 03/10/2021: ALT 12; BUN 26; Creatinine, Ser 1.10; Hemoglobin 13.3; Platelets 170; Potassium 4.2; Sodium 138 03/11/2021: TSH 3.678   Recent Lipid Panel Lab Results  Component Value Date/Time   CHOL 219 (H) 03/11/2021 05:03 AM   TRIG 176 (H) 03/11/2021 05:03 AM   HDL 67 03/11/2021 05:03 AM   CHOLHDL 3.3 03/11/2021 05:03 AM   LDLCALC 117 (H) 03/11/2021 05:03 AM    Wt Readings from Last 3 Encounters:  03/25/21 183 lb (83  kg)  03/10/21 180 lb (81.6 kg)  01/01/21 182 lb (82.6 kg)     Objective:    Vital Signs:  BP 122/80   Pulse 69   Ht 5\' 4"  (1.626 m)   Wt 183 lb (83 kg)   SpO2 99%   BMI 31.41 kg/m    GEN: Well nourished, well developed in no acute distress HEENT: Normal NECK: No JVD; No carotid bruits LYMPHATICS: No lymphadenopathy CARDIAC:RRR, no murmurs, rubs, gallops RESPIRATORY:  Clear to auscultation without rales, wheezing or rhonchi  ABDOMEN: Soft, non-tender, non-distended MUSCULOSKELETAL:  No edema; No deformity  SKIN: Warm and dry NEUROLOGIC:  Alert and oriented x 3 PSYCHIATRIC:  Normal affect    ASSESSMENT & PLAN:    1.  Chronic combined systolic/diastolic CHF -she has chronic DOE that is stable -weight is stable -Continue prescription drug management with Lasix 40mg  daily -encouraged compliance with < 2gm Na diet -repeat 2D echo improved after BiV upgrade -I have personally reviewed and interpreted outside labs performed by patient's PCP which showed SCr 1.1 and K+ 4.2 on %./16/2022  2.  Aortic insufficiency -mild by echo 04/2019  3.  Mitral regurgitation -moderate by echo 04/2019 -repeat echo showed trivial MR in March 2022  4.  HTN -BP borderline controlled on exam -she is not on any BP lowering meds  5.  HLD -followed by PCP  6.  Tachy-brady syndrome -s/p AVN ablation with PPM and subsequent BiV upgrade -followed in device  7.  DCM -unclear etiology -EF 30-35% on echo 04/2019 and improved to 40-45% by echo 12/2020 after BiV upgrade -she is intolerant to ACE I and ARBs and BB have caused severe fatigue in the past -s/p BiV PPM upgrade and CHF sx much improved  8.  PVCs -her palpitations have significantly improved but only feels them at night on her left side  9.  Stage 3 CKD -followed by PCP  10.  Persistent atrial fibrillation -now permanent and s/p AVN ablation -PPM dependent now -Continue prescription drug management with Eliquis 5mg   BID -denies any bleeding problems -I have personally reviewed and interpreted outside labs performed by patient's PCP which showed Hbg 13.3 in May 2022  11.  Persistent chronic fatigue -fatigue improved after biV PPM upgrade but still has some due to her chronic pain  Medication Adjustments/Labs and Tests Ordered: Current  medicines are reviewed at length with the patient today.  Concerns regarding medicines are outlined above.  Tests Ordered: No orders of the defined types were placed in this encounter.  Medication Changes: No orders of the defined types were placed in this encounter.   Disposition:  Follow up in 6 month(s)  Signed, Fransico Him, MD  03/25/2021 3:46 PM    [place]

## 2021-03-26 DIAGNOSIS — E213 Hyperparathyroidism, unspecified: Secondary | ICD-10-CM | POA: Diagnosis not present

## 2021-03-26 DIAGNOSIS — K219 Gastro-esophageal reflux disease without esophagitis: Secondary | ICD-10-CM | POA: Diagnosis not present

## 2021-03-26 DIAGNOSIS — M1711 Unilateral primary osteoarthritis, right knee: Secondary | ICD-10-CM | POA: Diagnosis not present

## 2021-03-26 DIAGNOSIS — Z483 Aftercare following surgery for neoplasm: Secondary | ICD-10-CM | POA: Diagnosis not present

## 2021-03-26 DIAGNOSIS — R69 Illness, unspecified: Secondary | ICD-10-CM | POA: Diagnosis not present

## 2021-03-26 DIAGNOSIS — I5032 Chronic diastolic (congestive) heart failure: Secondary | ICD-10-CM | POA: Diagnosis not present

## 2021-03-26 DIAGNOSIS — I420 Dilated cardiomyopathy: Secondary | ICD-10-CM | POA: Diagnosis not present

## 2021-03-26 DIAGNOSIS — I130 Hypertensive heart and chronic kidney disease with heart failure and stage 1 through stage 4 chronic kidney disease, or unspecified chronic kidney disease: Secondary | ICD-10-CM | POA: Diagnosis not present

## 2021-03-26 DIAGNOSIS — I080 Rheumatic disorders of both mitral and aortic valves: Secondary | ICD-10-CM | POA: Diagnosis not present

## 2021-03-26 DIAGNOSIS — E7800 Pure hypercholesterolemia, unspecified: Secondary | ICD-10-CM | POA: Diagnosis not present

## 2021-03-26 DIAGNOSIS — I4821 Permanent atrial fibrillation: Secondary | ICD-10-CM | POA: Diagnosis not present

## 2021-03-26 DIAGNOSIS — N183 Chronic kidney disease, stage 3 unspecified: Secondary | ICD-10-CM | POA: Diagnosis not present

## 2021-03-26 DIAGNOSIS — Z7901 Long term (current) use of anticoagulants: Secondary | ICD-10-CM | POA: Diagnosis not present

## 2021-03-26 DIAGNOSIS — L299 Pruritus, unspecified: Secondary | ICD-10-CM | POA: Diagnosis not present

## 2021-03-26 DIAGNOSIS — M519 Unspecified thoracic, thoracolumbar and lumbosacral intervertebral disc disorder: Secondary | ICD-10-CM | POA: Diagnosis not present

## 2021-03-26 DIAGNOSIS — R32 Unspecified urinary incontinence: Secondary | ICD-10-CM | POA: Diagnosis not present

## 2021-03-26 DIAGNOSIS — C44300 Unspecified malignant neoplasm of skin of unspecified part of face: Secondary | ICD-10-CM | POA: Diagnosis not present

## 2021-03-26 DIAGNOSIS — I447 Left bundle-branch block, unspecified: Secondary | ICD-10-CM | POA: Diagnosis not present

## 2021-03-26 DIAGNOSIS — Z89422 Acquired absence of other left toe(s): Secondary | ICD-10-CM | POA: Diagnosis not present

## 2021-03-26 DIAGNOSIS — I495 Sick sinus syndrome: Secondary | ICD-10-CM | POA: Diagnosis not present

## 2021-03-26 DIAGNOSIS — Z89421 Acquired absence of other right toe(s): Secondary | ICD-10-CM | POA: Diagnosis not present

## 2021-03-26 DIAGNOSIS — I428 Other cardiomyopathies: Secondary | ICD-10-CM | POA: Diagnosis not present

## 2021-03-26 DIAGNOSIS — Z950 Presence of cardiac pacemaker: Secondary | ICD-10-CM | POA: Diagnosis not present

## 2021-03-26 DIAGNOSIS — E039 Hypothyroidism, unspecified: Secondary | ICD-10-CM | POA: Diagnosis not present

## 2021-03-28 ENCOUNTER — Telehealth: Payer: Self-pay

## 2021-03-28 ENCOUNTER — Ambulatory Visit (INDEPENDENT_AMBULATORY_CARE_PROVIDER_SITE_OTHER): Payer: Medicare HMO

## 2021-03-28 DIAGNOSIS — I4821 Permanent atrial fibrillation: Secondary | ICD-10-CM | POA: Diagnosis not present

## 2021-03-28 NOTE — Telephone Encounter (Signed)
The patient called to get help with her monitor. Transmission received successfully.

## 2021-03-29 LAB — CUP PACEART REMOTE DEVICE CHECK
Battery Remaining Longevity: 132 mo
Battery Remaining Percentage: 100 %
Brady Statistic RA Percent Paced: 0 %
Brady Statistic RV Percent Paced: 96 %
Date Time Interrogation Session: 20220603034100
Implantable Lead Implant Date: 20140508
Implantable Lead Implant Date: 20140508
Implantable Lead Implant Date: 20201202
Implantable Lead Location: 753858
Implantable Lead Location: 753859
Implantable Lead Location: 753860
Implantable Lead Model: 4135
Implantable Lead Model: 4136
Implantable Lead Model: 4674
Implantable Lead Serial Number: 29333020
Implantable Lead Serial Number: 29379474
Implantable Lead Serial Number: 851686
Implantable Pulse Generator Implant Date: 20201202
Lead Channel Impedance Value: 659 Ohm
Lead Channel Impedance Value: 792 Ohm
Lead Channel Pacing Threshold Amplitude: 0.7 V
Lead Channel Pacing Threshold Amplitude: 0.7 V
Lead Channel Pacing Threshold Pulse Width: 0.4 ms
Lead Channel Pacing Threshold Pulse Width: 0.4 ms
Lead Channel Setting Pacing Amplitude: 2 V
Lead Channel Setting Pacing Amplitude: 2.5 V
Lead Channel Setting Pacing Pulse Width: 0.4 ms
Lead Channel Setting Pacing Pulse Width: 0.4 ms
Lead Channel Setting Sensing Sensitivity: 2.5 mV
Lead Channel Setting Sensing Sensitivity: 3 mV
Pulse Gen Serial Number: 750525

## 2021-04-01 DIAGNOSIS — R69 Illness, unspecified: Secondary | ICD-10-CM | POA: Diagnosis not present

## 2021-04-01 DIAGNOSIS — E7800 Pure hypercholesterolemia, unspecified: Secondary | ICD-10-CM | POA: Diagnosis not present

## 2021-04-01 DIAGNOSIS — I5032 Chronic diastolic (congestive) heart failure: Secondary | ICD-10-CM | POA: Diagnosis not present

## 2021-04-01 DIAGNOSIS — M519 Unspecified thoracic, thoracolumbar and lumbosacral intervertebral disc disorder: Secondary | ICD-10-CM | POA: Diagnosis not present

## 2021-04-01 DIAGNOSIS — Z950 Presence of cardiac pacemaker: Secondary | ICD-10-CM | POA: Diagnosis not present

## 2021-04-01 DIAGNOSIS — I080 Rheumatic disorders of both mitral and aortic valves: Secondary | ICD-10-CM | POA: Diagnosis not present

## 2021-04-01 DIAGNOSIS — I495 Sick sinus syndrome: Secondary | ICD-10-CM | POA: Diagnosis not present

## 2021-04-01 DIAGNOSIS — R32 Unspecified urinary incontinence: Secondary | ICD-10-CM | POA: Diagnosis not present

## 2021-04-01 DIAGNOSIS — C44300 Unspecified malignant neoplasm of skin of unspecified part of face: Secondary | ICD-10-CM | POA: Diagnosis not present

## 2021-04-01 DIAGNOSIS — K219 Gastro-esophageal reflux disease without esophagitis: Secondary | ICD-10-CM | POA: Diagnosis not present

## 2021-04-01 DIAGNOSIS — Z89421 Acquired absence of other right toe(s): Secondary | ICD-10-CM | POA: Diagnosis not present

## 2021-04-01 DIAGNOSIS — Z483 Aftercare following surgery for neoplasm: Secondary | ICD-10-CM | POA: Diagnosis not present

## 2021-04-01 DIAGNOSIS — I4821 Permanent atrial fibrillation: Secondary | ICD-10-CM | POA: Diagnosis not present

## 2021-04-01 DIAGNOSIS — L299 Pruritus, unspecified: Secondary | ICD-10-CM | POA: Diagnosis not present

## 2021-04-01 DIAGNOSIS — M1711 Unilateral primary osteoarthritis, right knee: Secondary | ICD-10-CM | POA: Diagnosis not present

## 2021-04-01 DIAGNOSIS — I447 Left bundle-branch block, unspecified: Secondary | ICD-10-CM | POA: Diagnosis not present

## 2021-04-01 DIAGNOSIS — E039 Hypothyroidism, unspecified: Secondary | ICD-10-CM | POA: Diagnosis not present

## 2021-04-01 DIAGNOSIS — E213 Hyperparathyroidism, unspecified: Secondary | ICD-10-CM | POA: Diagnosis not present

## 2021-04-01 DIAGNOSIS — I428 Other cardiomyopathies: Secondary | ICD-10-CM | POA: Diagnosis not present

## 2021-04-01 DIAGNOSIS — I130 Hypertensive heart and chronic kidney disease with heart failure and stage 1 through stage 4 chronic kidney disease, or unspecified chronic kidney disease: Secondary | ICD-10-CM | POA: Diagnosis not present

## 2021-04-01 DIAGNOSIS — N183 Chronic kidney disease, stage 3 unspecified: Secondary | ICD-10-CM | POA: Diagnosis not present

## 2021-04-01 DIAGNOSIS — Z7901 Long term (current) use of anticoagulants: Secondary | ICD-10-CM | POA: Diagnosis not present

## 2021-04-01 DIAGNOSIS — Z89422 Acquired absence of other left toe(s): Secondary | ICD-10-CM | POA: Diagnosis not present

## 2021-04-01 DIAGNOSIS — I420 Dilated cardiomyopathy: Secondary | ICD-10-CM | POA: Diagnosis not present

## 2021-04-07 DIAGNOSIS — L299 Pruritus, unspecified: Secondary | ICD-10-CM | POA: Diagnosis not present

## 2021-04-07 DIAGNOSIS — K219 Gastro-esophageal reflux disease without esophagitis: Secondary | ICD-10-CM | POA: Diagnosis not present

## 2021-04-07 DIAGNOSIS — I447 Left bundle-branch block, unspecified: Secondary | ICD-10-CM | POA: Diagnosis not present

## 2021-04-07 DIAGNOSIS — Z7901 Long term (current) use of anticoagulants: Secondary | ICD-10-CM | POA: Diagnosis not present

## 2021-04-07 DIAGNOSIS — I420 Dilated cardiomyopathy: Secondary | ICD-10-CM | POA: Diagnosis not present

## 2021-04-07 DIAGNOSIS — I4821 Permanent atrial fibrillation: Secondary | ICD-10-CM | POA: Diagnosis not present

## 2021-04-07 DIAGNOSIS — I130 Hypertensive heart and chronic kidney disease with heart failure and stage 1 through stage 4 chronic kidney disease, or unspecified chronic kidney disease: Secondary | ICD-10-CM | POA: Diagnosis not present

## 2021-04-07 DIAGNOSIS — M519 Unspecified thoracic, thoracolumbar and lumbosacral intervertebral disc disorder: Secondary | ICD-10-CM | POA: Diagnosis not present

## 2021-04-07 DIAGNOSIS — I495 Sick sinus syndrome: Secondary | ICD-10-CM | POA: Diagnosis not present

## 2021-04-07 DIAGNOSIS — M1711 Unilateral primary osteoarthritis, right knee: Secondary | ICD-10-CM | POA: Diagnosis not present

## 2021-04-07 DIAGNOSIS — I428 Other cardiomyopathies: Secondary | ICD-10-CM | POA: Diagnosis not present

## 2021-04-07 DIAGNOSIS — Z950 Presence of cardiac pacemaker: Secondary | ICD-10-CM | POA: Diagnosis not present

## 2021-04-07 DIAGNOSIS — I080 Rheumatic disorders of both mitral and aortic valves: Secondary | ICD-10-CM | POA: Diagnosis not present

## 2021-04-07 DIAGNOSIS — R69 Illness, unspecified: Secondary | ICD-10-CM | POA: Diagnosis not present

## 2021-04-07 DIAGNOSIS — I5032 Chronic diastolic (congestive) heart failure: Secondary | ICD-10-CM | POA: Diagnosis not present

## 2021-04-07 DIAGNOSIS — E7800 Pure hypercholesterolemia, unspecified: Secondary | ICD-10-CM | POA: Diagnosis not present

## 2021-04-07 DIAGNOSIS — Z89422 Acquired absence of other left toe(s): Secondary | ICD-10-CM | POA: Diagnosis not present

## 2021-04-07 DIAGNOSIS — E039 Hypothyroidism, unspecified: Secondary | ICD-10-CM | POA: Diagnosis not present

## 2021-04-07 DIAGNOSIS — E213 Hyperparathyroidism, unspecified: Secondary | ICD-10-CM | POA: Diagnosis not present

## 2021-04-07 DIAGNOSIS — C44300 Unspecified malignant neoplasm of skin of unspecified part of face: Secondary | ICD-10-CM | POA: Diagnosis not present

## 2021-04-07 DIAGNOSIS — Z89421 Acquired absence of other right toe(s): Secondary | ICD-10-CM | POA: Diagnosis not present

## 2021-04-07 DIAGNOSIS — R32 Unspecified urinary incontinence: Secondary | ICD-10-CM | POA: Diagnosis not present

## 2021-04-07 DIAGNOSIS — Z483 Aftercare following surgery for neoplasm: Secondary | ICD-10-CM | POA: Diagnosis not present

## 2021-04-07 DIAGNOSIS — N183 Chronic kidney disease, stage 3 unspecified: Secondary | ICD-10-CM | POA: Diagnosis not present

## 2021-04-08 DIAGNOSIS — L089 Local infection of the skin and subcutaneous tissue, unspecified: Secondary | ICD-10-CM | POA: Diagnosis not present

## 2021-04-08 DIAGNOSIS — L299 Pruritus, unspecified: Secondary | ICD-10-CM | POA: Diagnosis not present

## 2021-04-08 DIAGNOSIS — L570 Actinic keratosis: Secondary | ICD-10-CM | POA: Diagnosis not present

## 2021-04-08 DIAGNOSIS — L853 Xerosis cutis: Secondary | ICD-10-CM | POA: Diagnosis not present

## 2021-04-09 DIAGNOSIS — K219 Gastro-esophageal reflux disease without esophagitis: Secondary | ICD-10-CM | POA: Diagnosis not present

## 2021-04-09 DIAGNOSIS — E039 Hypothyroidism, unspecified: Secondary | ICD-10-CM | POA: Diagnosis not present

## 2021-04-09 DIAGNOSIS — I4821 Permanent atrial fibrillation: Secondary | ICD-10-CM | POA: Diagnosis not present

## 2021-04-09 DIAGNOSIS — R69 Illness, unspecified: Secondary | ICD-10-CM | POA: Diagnosis not present

## 2021-04-09 DIAGNOSIS — E213 Hyperparathyroidism, unspecified: Secondary | ICD-10-CM | POA: Diagnosis not present

## 2021-04-09 DIAGNOSIS — I420 Dilated cardiomyopathy: Secondary | ICD-10-CM | POA: Diagnosis not present

## 2021-04-09 DIAGNOSIS — L299 Pruritus, unspecified: Secondary | ICD-10-CM | POA: Diagnosis not present

## 2021-04-09 DIAGNOSIS — I130 Hypertensive heart and chronic kidney disease with heart failure and stage 1 through stage 4 chronic kidney disease, or unspecified chronic kidney disease: Secondary | ICD-10-CM | POA: Diagnosis not present

## 2021-04-09 DIAGNOSIS — Z89421 Acquired absence of other right toe(s): Secondary | ICD-10-CM | POA: Diagnosis not present

## 2021-04-09 DIAGNOSIS — I447 Left bundle-branch block, unspecified: Secondary | ICD-10-CM | POA: Diagnosis not present

## 2021-04-09 DIAGNOSIS — R32 Unspecified urinary incontinence: Secondary | ICD-10-CM | POA: Diagnosis not present

## 2021-04-09 DIAGNOSIS — Z7901 Long term (current) use of anticoagulants: Secondary | ICD-10-CM | POA: Diagnosis not present

## 2021-04-09 DIAGNOSIS — M1711 Unilateral primary osteoarthritis, right knee: Secondary | ICD-10-CM | POA: Diagnosis not present

## 2021-04-09 DIAGNOSIS — C44300 Unspecified malignant neoplasm of skin of unspecified part of face: Secondary | ICD-10-CM | POA: Diagnosis not present

## 2021-04-09 DIAGNOSIS — I5032 Chronic diastolic (congestive) heart failure: Secondary | ICD-10-CM | POA: Diagnosis not present

## 2021-04-09 DIAGNOSIS — E7800 Pure hypercholesterolemia, unspecified: Secondary | ICD-10-CM | POA: Diagnosis not present

## 2021-04-09 DIAGNOSIS — Z89422 Acquired absence of other left toe(s): Secondary | ICD-10-CM | POA: Diagnosis not present

## 2021-04-09 DIAGNOSIS — I428 Other cardiomyopathies: Secondary | ICD-10-CM | POA: Diagnosis not present

## 2021-04-09 DIAGNOSIS — M519 Unspecified thoracic, thoracolumbar and lumbosacral intervertebral disc disorder: Secondary | ICD-10-CM | POA: Diagnosis not present

## 2021-04-09 DIAGNOSIS — N183 Chronic kidney disease, stage 3 unspecified: Secondary | ICD-10-CM | POA: Diagnosis not present

## 2021-04-09 DIAGNOSIS — I495 Sick sinus syndrome: Secondary | ICD-10-CM | POA: Diagnosis not present

## 2021-04-09 DIAGNOSIS — Z483 Aftercare following surgery for neoplasm: Secondary | ICD-10-CM | POA: Diagnosis not present

## 2021-04-09 DIAGNOSIS — Z950 Presence of cardiac pacemaker: Secondary | ICD-10-CM | POA: Diagnosis not present

## 2021-04-09 DIAGNOSIS — I080 Rheumatic disorders of both mitral and aortic valves: Secondary | ICD-10-CM | POA: Diagnosis not present

## 2021-04-15 NOTE — Progress Notes (Signed)
Remote pacemaker transmission.   

## 2021-04-16 DIAGNOSIS — H401422 Capsular glaucoma with pseudoexfoliation of lens, left eye, moderate stage: Secondary | ICD-10-CM | POA: Diagnosis not present

## 2021-04-16 DIAGNOSIS — H0014 Chalazion left upper eyelid: Secondary | ICD-10-CM | POA: Diagnosis not present

## 2021-04-16 DIAGNOSIS — H52203 Unspecified astigmatism, bilateral: Secondary | ICD-10-CM | POA: Diagnosis not present

## 2021-04-16 DIAGNOSIS — H401413 Capsular glaucoma with pseudoexfoliation of lens, right eye, severe stage: Secondary | ICD-10-CM | POA: Diagnosis not present

## 2021-04-17 DIAGNOSIS — R32 Unspecified urinary incontinence: Secondary | ICD-10-CM | POA: Diagnosis not present

## 2021-04-17 DIAGNOSIS — I495 Sick sinus syndrome: Secondary | ICD-10-CM | POA: Diagnosis not present

## 2021-04-17 DIAGNOSIS — I4821 Permanent atrial fibrillation: Secondary | ICD-10-CM | POA: Diagnosis not present

## 2021-04-17 DIAGNOSIS — I420 Dilated cardiomyopathy: Secondary | ICD-10-CM | POA: Diagnosis not present

## 2021-04-17 DIAGNOSIS — I130 Hypertensive heart and chronic kidney disease with heart failure and stage 1 through stage 4 chronic kidney disease, or unspecified chronic kidney disease: Secondary | ICD-10-CM | POA: Diagnosis not present

## 2021-04-17 DIAGNOSIS — E7800 Pure hypercholesterolemia, unspecified: Secondary | ICD-10-CM | POA: Diagnosis not present

## 2021-04-17 DIAGNOSIS — E213 Hyperparathyroidism, unspecified: Secondary | ICD-10-CM | POA: Diagnosis not present

## 2021-04-17 DIAGNOSIS — M519 Unspecified thoracic, thoracolumbar and lumbosacral intervertebral disc disorder: Secondary | ICD-10-CM | POA: Diagnosis not present

## 2021-04-17 DIAGNOSIS — L299 Pruritus, unspecified: Secondary | ICD-10-CM | POA: Diagnosis not present

## 2021-04-17 DIAGNOSIS — Z7901 Long term (current) use of anticoagulants: Secondary | ICD-10-CM | POA: Diagnosis not present

## 2021-04-17 DIAGNOSIS — M1711 Unilateral primary osteoarthritis, right knee: Secondary | ICD-10-CM | POA: Diagnosis not present

## 2021-04-17 DIAGNOSIS — E039 Hypothyroidism, unspecified: Secondary | ICD-10-CM | POA: Diagnosis not present

## 2021-04-17 DIAGNOSIS — Z483 Aftercare following surgery for neoplasm: Secondary | ICD-10-CM | POA: Diagnosis not present

## 2021-04-17 DIAGNOSIS — Z950 Presence of cardiac pacemaker: Secondary | ICD-10-CM | POA: Diagnosis not present

## 2021-04-17 DIAGNOSIS — Z89421 Acquired absence of other right toe(s): Secondary | ICD-10-CM | POA: Diagnosis not present

## 2021-04-17 DIAGNOSIS — I428 Other cardiomyopathies: Secondary | ICD-10-CM | POA: Diagnosis not present

## 2021-04-17 DIAGNOSIS — I5032 Chronic diastolic (congestive) heart failure: Secondary | ICD-10-CM | POA: Diagnosis not present

## 2021-04-17 DIAGNOSIS — Z89422 Acquired absence of other left toe(s): Secondary | ICD-10-CM | POA: Diagnosis not present

## 2021-04-17 DIAGNOSIS — I080 Rheumatic disorders of both mitral and aortic valves: Secondary | ICD-10-CM | POA: Diagnosis not present

## 2021-04-17 DIAGNOSIS — N183 Chronic kidney disease, stage 3 unspecified: Secondary | ICD-10-CM | POA: Diagnosis not present

## 2021-04-17 DIAGNOSIS — K219 Gastro-esophageal reflux disease without esophagitis: Secondary | ICD-10-CM | POA: Diagnosis not present

## 2021-04-17 DIAGNOSIS — C44300 Unspecified malignant neoplasm of skin of unspecified part of face: Secondary | ICD-10-CM | POA: Diagnosis not present

## 2021-04-17 DIAGNOSIS — R69 Illness, unspecified: Secondary | ICD-10-CM | POA: Diagnosis not present

## 2021-04-17 DIAGNOSIS — I447 Left bundle-branch block, unspecified: Secondary | ICD-10-CM | POA: Diagnosis not present

## 2021-04-24 DIAGNOSIS — R32 Unspecified urinary incontinence: Secondary | ICD-10-CM | POA: Diagnosis not present

## 2021-04-24 DIAGNOSIS — R69 Illness, unspecified: Secondary | ICD-10-CM | POA: Diagnosis not present

## 2021-04-24 DIAGNOSIS — E7800 Pure hypercholesterolemia, unspecified: Secondary | ICD-10-CM | POA: Diagnosis not present

## 2021-04-24 DIAGNOSIS — I420 Dilated cardiomyopathy: Secondary | ICD-10-CM | POA: Diagnosis not present

## 2021-04-24 DIAGNOSIS — I4821 Permanent atrial fibrillation: Secondary | ICD-10-CM | POA: Diagnosis not present

## 2021-04-24 DIAGNOSIS — K219 Gastro-esophageal reflux disease without esophagitis: Secondary | ICD-10-CM | POA: Diagnosis not present

## 2021-04-24 DIAGNOSIS — Z7901 Long term (current) use of anticoagulants: Secondary | ICD-10-CM | POA: Diagnosis not present

## 2021-04-24 DIAGNOSIS — Z950 Presence of cardiac pacemaker: Secondary | ICD-10-CM | POA: Diagnosis not present

## 2021-04-24 DIAGNOSIS — L299 Pruritus, unspecified: Secondary | ICD-10-CM | POA: Diagnosis not present

## 2021-04-24 DIAGNOSIS — I447 Left bundle-branch block, unspecified: Secondary | ICD-10-CM | POA: Diagnosis not present

## 2021-04-24 DIAGNOSIS — E213 Hyperparathyroidism, unspecified: Secondary | ICD-10-CM | POA: Diagnosis not present

## 2021-04-24 DIAGNOSIS — M519 Unspecified thoracic, thoracolumbar and lumbosacral intervertebral disc disorder: Secondary | ICD-10-CM | POA: Diagnosis not present

## 2021-04-24 DIAGNOSIS — C44300 Unspecified malignant neoplasm of skin of unspecified part of face: Secondary | ICD-10-CM | POA: Diagnosis not present

## 2021-04-24 DIAGNOSIS — E039 Hypothyroidism, unspecified: Secondary | ICD-10-CM | POA: Diagnosis not present

## 2021-04-24 DIAGNOSIS — M1711 Unilateral primary osteoarthritis, right knee: Secondary | ICD-10-CM | POA: Diagnosis not present

## 2021-04-24 DIAGNOSIS — I5032 Chronic diastolic (congestive) heart failure: Secondary | ICD-10-CM | POA: Diagnosis not present

## 2021-04-24 DIAGNOSIS — I495 Sick sinus syndrome: Secondary | ICD-10-CM | POA: Diagnosis not present

## 2021-04-24 DIAGNOSIS — Z89421 Acquired absence of other right toe(s): Secondary | ICD-10-CM | POA: Diagnosis not present

## 2021-04-24 DIAGNOSIS — I080 Rheumatic disorders of both mitral and aortic valves: Secondary | ICD-10-CM | POA: Diagnosis not present

## 2021-04-24 DIAGNOSIS — I428 Other cardiomyopathies: Secondary | ICD-10-CM | POA: Diagnosis not present

## 2021-04-24 DIAGNOSIS — I130 Hypertensive heart and chronic kidney disease with heart failure and stage 1 through stage 4 chronic kidney disease, or unspecified chronic kidney disease: Secondary | ICD-10-CM | POA: Diagnosis not present

## 2021-04-24 DIAGNOSIS — N183 Chronic kidney disease, stage 3 unspecified: Secondary | ICD-10-CM | POA: Diagnosis not present

## 2021-04-24 DIAGNOSIS — Z483 Aftercare following surgery for neoplasm: Secondary | ICD-10-CM | POA: Diagnosis not present

## 2021-04-24 DIAGNOSIS — Z89422 Acquired absence of other left toe(s): Secondary | ICD-10-CM | POA: Diagnosis not present

## 2021-04-30 ENCOUNTER — Inpatient Hospital Stay: Payer: Self-pay | Admitting: Adult Health

## 2021-05-01 DIAGNOSIS — Z483 Aftercare following surgery for neoplasm: Secondary | ICD-10-CM | POA: Diagnosis not present

## 2021-05-01 DIAGNOSIS — E039 Hypothyroidism, unspecified: Secondary | ICD-10-CM | POA: Diagnosis not present

## 2021-05-01 DIAGNOSIS — I130 Hypertensive heart and chronic kidney disease with heart failure and stage 1 through stage 4 chronic kidney disease, or unspecified chronic kidney disease: Secondary | ICD-10-CM | POA: Diagnosis not present

## 2021-05-01 DIAGNOSIS — E7800 Pure hypercholesterolemia, unspecified: Secondary | ICD-10-CM | POA: Diagnosis not present

## 2021-05-01 DIAGNOSIS — I420 Dilated cardiomyopathy: Secondary | ICD-10-CM | POA: Diagnosis not present

## 2021-05-01 DIAGNOSIS — I495 Sick sinus syndrome: Secondary | ICD-10-CM | POA: Diagnosis not present

## 2021-05-01 DIAGNOSIS — K219 Gastro-esophageal reflux disease without esophagitis: Secondary | ICD-10-CM | POA: Diagnosis not present

## 2021-05-01 DIAGNOSIS — I447 Left bundle-branch block, unspecified: Secondary | ICD-10-CM | POA: Diagnosis not present

## 2021-05-01 DIAGNOSIS — N183 Chronic kidney disease, stage 3 unspecified: Secondary | ICD-10-CM | POA: Diagnosis not present

## 2021-05-01 DIAGNOSIS — I428 Other cardiomyopathies: Secondary | ICD-10-CM | POA: Diagnosis not present

## 2021-05-01 DIAGNOSIS — I5032 Chronic diastolic (congestive) heart failure: Secondary | ICD-10-CM | POA: Diagnosis not present

## 2021-05-01 DIAGNOSIS — Z950 Presence of cardiac pacemaker: Secondary | ICD-10-CM | POA: Diagnosis not present

## 2021-05-01 DIAGNOSIS — R32 Unspecified urinary incontinence: Secondary | ICD-10-CM | POA: Diagnosis not present

## 2021-05-01 DIAGNOSIS — C44300 Unspecified malignant neoplasm of skin of unspecified part of face: Secondary | ICD-10-CM | POA: Diagnosis not present

## 2021-05-01 DIAGNOSIS — I4821 Permanent atrial fibrillation: Secondary | ICD-10-CM | POA: Diagnosis not present

## 2021-05-01 DIAGNOSIS — Z89422 Acquired absence of other left toe(s): Secondary | ICD-10-CM | POA: Diagnosis not present

## 2021-05-01 DIAGNOSIS — I080 Rheumatic disorders of both mitral and aortic valves: Secondary | ICD-10-CM | POA: Diagnosis not present

## 2021-05-01 DIAGNOSIS — E213 Hyperparathyroidism, unspecified: Secondary | ICD-10-CM | POA: Diagnosis not present

## 2021-05-01 DIAGNOSIS — R69 Illness, unspecified: Secondary | ICD-10-CM | POA: Diagnosis not present

## 2021-05-01 DIAGNOSIS — Z89421 Acquired absence of other right toe(s): Secondary | ICD-10-CM | POA: Diagnosis not present

## 2021-05-01 DIAGNOSIS — L299 Pruritus, unspecified: Secondary | ICD-10-CM | POA: Diagnosis not present

## 2021-05-01 DIAGNOSIS — Z7901 Long term (current) use of anticoagulants: Secondary | ICD-10-CM | POA: Diagnosis not present

## 2021-05-01 DIAGNOSIS — M519 Unspecified thoracic, thoracolumbar and lumbosacral intervertebral disc disorder: Secondary | ICD-10-CM | POA: Diagnosis not present

## 2021-05-01 DIAGNOSIS — M1711 Unilateral primary osteoarthritis, right knee: Secondary | ICD-10-CM | POA: Diagnosis not present

## 2021-05-04 ENCOUNTER — Other Ambulatory Visit: Payer: Self-pay

## 2021-05-04 ENCOUNTER — Encounter (HOSPITAL_COMMUNITY): Payer: Self-pay | Admitting: Emergency Medicine

## 2021-05-04 ENCOUNTER — Emergency Department (HOSPITAL_COMMUNITY): Payer: Medicare HMO

## 2021-05-04 ENCOUNTER — Emergency Department (HOSPITAL_COMMUNITY)
Admission: EM | Admit: 2021-05-04 | Discharge: 2021-05-05 | Disposition: A | Discharge status: Home / Self Care | Payer: Medicare HMO | Attending: Emergency Medicine | Admitting: Emergency Medicine

## 2021-05-04 DIAGNOSIS — Z950 Presence of cardiac pacemaker: Secondary | ICD-10-CM | POA: Diagnosis not present

## 2021-05-04 DIAGNOSIS — K5730 Diverticulosis of large intestine without perforation or abscess without bleeding: Secondary | ICD-10-CM | POA: Diagnosis not present

## 2021-05-04 DIAGNOSIS — R9389 Abnormal findings on diagnostic imaging of other specified body structures: Secondary | ICD-10-CM | POA: Diagnosis not present

## 2021-05-04 DIAGNOSIS — N281 Cyst of kidney, acquired: Secondary | ICD-10-CM | POA: Diagnosis not present

## 2021-05-04 DIAGNOSIS — Z20822 Contact with and (suspected) exposure to covid-19: Secondary | ICD-10-CM | POA: Diagnosis not present

## 2021-05-04 DIAGNOSIS — N858 Other specified noninflammatory disorders of uterus: Secondary | ICD-10-CM | POA: Diagnosis not present

## 2021-05-04 DIAGNOSIS — I4819 Other persistent atrial fibrillation: Secondary | ICD-10-CM | POA: Diagnosis not present

## 2021-05-04 DIAGNOSIS — I130 Hypertensive heart and chronic kidney disease with heart failure and stage 1 through stage 4 chronic kidney disease, or unspecified chronic kidney disease: Secondary | ICD-10-CM | POA: Diagnosis not present

## 2021-05-04 DIAGNOSIS — Z7901 Long term (current) use of anticoagulants: Secondary | ICD-10-CM | POA: Insufficient documentation

## 2021-05-04 DIAGNOSIS — R10817 Generalized abdominal tenderness: Secondary | ICD-10-CM | POA: Insufficient documentation

## 2021-05-04 DIAGNOSIS — Z89421 Acquired absence of other right toe(s): Secondary | ICD-10-CM | POA: Diagnosis not present

## 2021-05-04 DIAGNOSIS — I10 Essential (primary) hypertension: Secondary | ICD-10-CM | POA: Diagnosis not present

## 2021-05-04 DIAGNOSIS — N939 Abnormal uterine and vaginal bleeding, unspecified: Secondary | ICD-10-CM | POA: Diagnosis not present

## 2021-05-04 DIAGNOSIS — Z85828 Personal history of other malignant neoplasm of skin: Secondary | ICD-10-CM | POA: Diagnosis not present

## 2021-05-04 DIAGNOSIS — N183 Chronic kidney disease, stage 3 unspecified: Secondary | ICD-10-CM | POA: Diagnosis not present

## 2021-05-04 DIAGNOSIS — K449 Diaphragmatic hernia without obstruction or gangrene: Secondary | ICD-10-CM | POA: Diagnosis not present

## 2021-05-04 DIAGNOSIS — I5043 Acute on chronic combined systolic (congestive) and diastolic (congestive) heart failure: Secondary | ICD-10-CM | POA: Diagnosis not present

## 2021-05-04 LAB — CBC WITH DIFFERENTIAL/PLATELET
Abs Immature Granulocytes: 0.05 10*3/uL (ref 0.00–0.07)
Basophils Absolute: 0 10*3/uL (ref 0.0–0.1)
Basophils Relative: 1 %
Eosinophils Absolute: 0.4 10*3/uL (ref 0.0–0.5)
Eosinophils Relative: 7 %
HCT: 38.6 % (ref 36.0–46.0)
Hemoglobin: 12.1 g/dL (ref 12.0–15.0)
Immature Granulocytes: 1 %
Lymphocytes Relative: 23 %
Lymphs Abs: 1.5 10*3/uL (ref 0.7–4.0)
MCH: 33.4 pg (ref 26.0–34.0)
MCHC: 31.3 g/dL (ref 30.0–36.0)
MCV: 106.6 fL — ABNORMAL HIGH (ref 80.0–100.0)
Monocytes Absolute: 0.7 10*3/uL (ref 0.1–1.0)
Monocytes Relative: 12 %
Neutro Abs: 3.6 10*3/uL (ref 1.7–7.7)
Neutrophils Relative %: 56 %
Platelets: 193 10*3/uL (ref 150–400)
RBC: 3.62 MIL/uL — ABNORMAL LOW (ref 3.87–5.11)
RDW: 14.5 % (ref 11.5–15.5)
WBC: 6.3 10*3/uL (ref 4.0–10.5)
nRBC: 0 % (ref 0.0–0.2)

## 2021-05-04 LAB — PROTIME-INR
INR: 1.1 (ref 0.8–1.2)
Prothrombin Time: 14.6 seconds (ref 11.4–15.2)

## 2021-05-04 LAB — LIPASE, BLOOD: Lipase: 34 U/L (ref 11–51)

## 2021-05-04 LAB — URINALYSIS, ROUTINE W REFLEX MICROSCOPIC
Bacteria, UA: NONE SEEN
Bilirubin Urine: NEGATIVE
Glucose, UA: NEGATIVE mg/dL
Ketones, ur: 5 mg/dL — AB
Leukocytes,Ua: NEGATIVE
Nitrite: NEGATIVE
Protein, ur: NEGATIVE mg/dL
Specific Gravity, Urine: 1.017 (ref 1.005–1.030)
pH: 5 (ref 5.0–8.0)

## 2021-05-04 LAB — COMPREHENSIVE METABOLIC PANEL
ALT: 13 U/L (ref 0–44)
AST: 23 U/L (ref 15–41)
Albumin: 4.1 g/dL (ref 3.5–5.0)
Alkaline Phosphatase: 57 U/L (ref 38–126)
Anion gap: 11 (ref 5–15)
BUN: 23 mg/dL (ref 8–23)
CO2: 22 mmol/L (ref 22–32)
Calcium: 10.5 mg/dL — ABNORMAL HIGH (ref 8.9–10.3)
Chloride: 107 mmol/L (ref 98–111)
Creatinine, Ser: 0.98 mg/dL (ref 0.44–1.00)
GFR, Estimated: 57 mL/min — ABNORMAL LOW (ref >60)
Glucose, Bld: 112 mg/dL — ABNORMAL HIGH (ref 70–99)
Potassium: 4 mmol/L (ref 3.5–5.1)
Sodium: 140 mmol/L (ref 135–145)
Total Bilirubin: 0.8 mg/dL (ref 0.3–1.2)
Total Protein: 7.2 g/dL (ref 6.5–8.1)

## 2021-05-04 LAB — TYPE AND SCREEN
ABO/RH(D): A POS
Antibody Screen: NEGATIVE

## 2021-05-04 LAB — RESP PANEL BY RT-PCR (FLU A&B, COVID) ARPGX2
Influenza A by PCR: NEGATIVE
Influenza B by PCR: NEGATIVE
SARS Coronavirus 2 by RT PCR: NEGATIVE

## 2021-05-04 LAB — APTT: aPTT: 32 seconds (ref 24–36)

## 2021-05-04 LAB — ABO/RH: ABO/RH(D): A POS

## 2021-05-04 MED ORDER — ACETAMINOPHEN 325 MG PO TABS
650.0000 mg | ORAL_TABLET | Freq: Once | ORAL | Status: AC
  Administered 2021-05-04: 650 mg via ORAL
  Filled 2021-05-04: qty 2

## 2021-05-04 MED ORDER — IOHEXOL 350 MG/ML SOLN
80.0000 mL | Freq: Once | INTRAVENOUS | Status: AC | PRN
  Administered 2021-05-04: 80 mL via INTRAVENOUS

## 2021-05-04 MED ORDER — SODIUM CHLORIDE (PF) 0.9 % IJ SOLN
INTRAMUSCULAR | Status: AC
  Filled 2021-05-04: qty 50

## 2021-05-04 NOTE — ED Provider Notes (Signed)
  Provider Note MRN:  [id number]  Arrival date & time: 05/04/21    ED Course and Medical Decision Making  Assumed care from Dr. Eulis Foster at shift change.  Postmenopausal bleeding, CT with some nodular irregularity of the uterus.  Case discussed with Dr. Berline Lopes of gynecology oncology, who agrees that there is no real need for emergent ultrasound this evening, can follow-up in the office.  Patient made aware of the possibility of uterine cancer, will return if bleeding worsens or is having any symptoms of symptomatic anemia, otherwise will follow-up with Dr. Berline Lopes.  Appropriate for discharge.  Procedures  Final Clinical Impressions(s) / ED Diagnoses     ICD-10-CM   1. Vaginal bleeding  N93.9     2. Abnormal finding on CT scan  R93.89       ED Discharge Orders          Ordered    Ambulatory referral to Gynecologic Oncology        05/04/21 2337              Discharge Instructions      You were evaluated in the Emergency Department and after careful evaluation, we did not find any emergent condition requiring admission or further testing in the hospital.  Your CT scan is showing an irregularity of the uterus.  As discussed, with this finding and your bleeding we are concerned for possible uterine cancer.  You will need further testing with the gynecology oncology team.  Please call the office number tomorrow morning to make sure you are scheduled for an appointment soon.  Please return to the Emergency Department if you experience any worsening of your condition.  Thank you for allowing [place] to be a part of your care.     Barth Kirks. [place] Small, [place] [email or web address]    Maudie Flakes, MD 05/04/21 [phone]

## 2021-05-04 NOTE — ED Notes (Signed)
Pt asked 2x for an estimated time of when his wife will get a room, emphasizing his age.  I explained the first time that I could not give an estimated time because it depends on room availability.   The 2nd time he kept talking over me.

## 2021-05-04 NOTE — ED Triage Notes (Signed)
Patient noticed vaginal bleeding last night, had an episode of incontinence during the night, mostly urine with blood in it. More vaginal bleeding today. Endorses generalized weakness, has all pelvic organs, states her stomach seems to be bigger than normal.

## 2021-05-04 NOTE — Discharge Instructions (Addendum)
You were evaluated in the Emergency Department and after careful evaluation, we did not find any emergent condition requiring admission or further testing in the hospital.  Your CT scan is showing an irregularity of the uterus.  As discussed, with this finding and your bleeding we are concerned for possible uterine cancer.  You will need further testing with the gynecology oncology team.  Please call the office number tomorrow morning to make sure you are scheduled for an appointment soon.  Please return to the Emergency Department if you experience any worsening of your condition.  Thank you for allowing [place] to be a part of your care.

## 2021-05-04 NOTE — ED Notes (Signed)
Dr.Wentz aware of pt's blood pressure.

## 2021-05-04 NOTE — ED Provider Notes (Signed)
Emergency Medicine Provider Triage Evaluation Note  Molly [name] , a 87 y.o. female  was evaluated in triage.  Pt complains of Fatigue also with some vaginal vs urinary bleeding. On eliquis last dose yst.   Denies CP but states feels quite fatigued. No SOB or NVD.   States she has many medical issues and is worried about becoming anemic.   Review of Systems  Positive: Bleeding (urinary vs vaginal) Negative: fever  Physical Exam  BP (!) 152/88 (BP Location: Right Arm)   Pulse 70   Temp 98.3 F (36.8 C) (Oral)   Resp 16   SpO2 99%  Gen:   Awake, no distress   Resp:  Normal effort  MSK:   Moves extremities without difficulty  Other:  Fatigued appearing, pale conjunctiva  Medical Decision Making  Medically screening exam initiated at 4:56 PM.  Appropriate orders placed.  Molly [name] was informed that the remainder of the evaluation will be completed by another provider, this initial triage assessment does not replace that evaluation, and the importance of remaining in the ED until their evaluation is complete.  Concern for anemia perhaps at baseline. Pt has had some vaginal vs urinary bleeidng Urine, labs ordered. Fatigue so will obtain other labs.    Molly [name] Molly [name], [place] 05/04/21 1658    Molly Bo, MD 05/04/21 2226

## 2021-05-04 NOTE — ED Provider Notes (Signed)
[facility] DEPT Provider Note   CSN: [id number] Arrival date & time: 05/04/21  1642     History No chief complaint on file.   Molly [name] is a 87 y.o. female.  HPI She presents for evaluation of blood on the perineum.  She has had urinary incontinence, and ongoing vaginal bleeding, since onset, around 2 AM today.  No prior similar problems.  She is on Eliquis for atrial fibrillation.  She denies fever.  She has decreased appetite today but did not vomit.  No known sick contacts.  She has had COVID vaccines.  She has chronic back pain.  She is here with her husband who helps to give history.  There are no other known modifying factors.    Past Medical History:  Diagnosis Date   Aortic insufficiency    Atrial tachycardia (HCC)    Cancer (HCC)    Skin cancer- basal.  1 mylenoma   Chronic combined systolic and diastolic CHF (congestive heart failure) (HCC)    CKD (chronic kidney disease), stage III (HCC)    Complication of anesthesia    DCM (dilated cardiomyopathy) ([place])    EF 40-45% by echo 2017 - nuclear stress test with no ischemia   GERD (gastroesophageal reflux disease)    H/O benign essential tremor    on amiodarone resolved off amio   H/O cardiac radiofrequency ablation    a. AV node ablation in 05/2019.   H/O hyperkalemia    Resolved off ACE inhibitors   High cholesterol    History of blood transfusion    Hyperkalemia    Hyperparathyroidism ([place])    Hypertension    Mitral regurgitation    trivial by echo 12/2020   Persistent atrial fibrillation (HCC)    PONV (postoperative nausea and vomiting)    PVC's (premature ventricular contractions)    Tachycardia-bradycardia syndrome ([place])    s/p PPM 02/2013    Patient Active Problem List   Diagnosis Date Noted   TIA (transient ischemic attack) 03/10/2021   Hyperparathyroidism, primary ([place]) 12/04/2020   Complete heart block ([place]) 09/27/2019   Atrial tachycardia ([place]) 09/13/2019    Iron deficiency anemia 08/14/2019   Long term (current) use of anticoagulants 07/12/2019   Acute on chronic combined systolic and diastolic CHF (congestive heart failure) ([place]) 05/25/2019   CKD (chronic kidney disease), stage III ([place]) 04/24/2019   Back pain, chronic 09/19/2018   Anxiety disorder, unspecified 09/19/2018   Visit for monitoring Tikosyn therapy 02/07/2018   Chronic combined systolic and diastolic heart failure ([place]) 12/28/2017   Hypotension 05/17/2017   SOB (shortness of breath)    DCM (dilated cardiomyopathy) ([place]) 11/10/2016   Aortic insufficiency    Mitral regurgitation 11/06/2015   Tachycardia-bradycardia syndrome ([place])    Pacemaker 04/18/2013   Claw toe, acquired 06/28/2012   Pain in joint involving ankle and foot 06/28/2012   PVC (premature ventricular contraction) 06/07/2012   Fatigue 06/07/2012   Symptomatic bradycardia 05/23/2012   Hyperlipidemia 10/29/2009   PULMONARY FUNCTION TESTS, ABNORMAL 10/29/2009   Essential hypertension 10/28/2009   Persistent atrial fibrillation ([place]) 10/28/2009    Past Surgical History:  Procedure Laterality Date   AMPUTATION Right 11/27/2014   Procedure: RIGHT 2ND AND 3RD TOE AMPUTATION;  Surgeon: Wylene Simmer, MD;  Location: [facility];  Service: Orthopedics;  Laterality: Right;   ATRIAL FIBRILLATION ABLATION N/A 05/29/2019   Procedure: AVN ABLATION;  Surgeon: Evans Lance, MD;  Location: [facility];  Service: Cardiovascular;  Laterality: N/A;  BIV UPGRADE N/A 09/27/2019   Procedure: BIV UPGRADE;  Surgeon: Evans Lance, MD;  Location: [facility];  Service: Cardiovascular;  Laterality: N/A;   BUNIONECTOMY     Right   CARDIAC CATHETERIZATION  09/25/2000   EF of 50% -- with normal left ventricular size and function   CARDIOVERSION N/A 12/28/2017   Procedure: CARDIOVERSION;  Surgeon: Dorothy Spark, MD;  Location: [facility] ENDOSCOPY;  Service: Cardiovascular;  Laterality: N/A;   CARDIOVERSION N/A 02/25/2018   Procedure:  CARDIOVERSION;  Surgeon: Skeet Latch, MD;  Location: [facility];  Service: Cardiovascular;  Laterality: N/A;   [place] Bilateral    Cataract   FOOT SURGERY     INSERT / REPLACE / REMOVE PACEMAKER  02/2013   PACEMAKER INSERTION  02/2013   PERMANENT PACEMAKER INSERTION N/A 03/02/2013   Procedure: PERMANENT PACEMAKER INSERTION;  Surgeon: Evans Lance, MD;  Location: [facility] CATH LAB;  Service: Cardiovascular;  Laterality: N/A;   SHOULDER ARTHROSCOPY W/ ROTATOR CUFF REPAIR     SHOULDER SURGERY Right    roto cuff   TOE AMPUTATION Left    2nd and 3rd, bunion under toes.   TONSILLECTOMY     VARICOSE VEIN SURGERY       OB History   No obstetric history on file.     Family History  Problem Relation Age of Onset   Hypertension Mother    Hypertension Father    Breast cancer Sister    Hypercalcemia Neg Hx     Social History   Tobacco Use   Smoking status: Never   Smokeless tobacco: Never  Vaping Use   Vaping Use: Never used  Substance Use Topics   Alcohol use: No   Drug use: No    Home Medications Prior to Admission medications   Medication Sig Start Date End Date Taking? Authorizing Provider  acetaminophen (TYLENOL) 500 MG tablet Take 1,000 mg by mouth 2 (two) times daily. For pain    [provider]  apixaban (ELIQUIS) 5 MG TABS tablet Take 1 tablet (5 mg total) by mouth 2 (two) times daily. 03/25/21   Sueanne Margarita, MD  dorzolamide-timolol (COSOPT) 22.3-6.8 MG/ML ophthalmic solution Place 1 drop into both eyes 2 (two) times daily. 06/09/20   [provider]  folic acid (FOLVITE) 1 MG tablet Take 1 tablet by mouth as directed. 6x a week Monday,Wednesday,Thursday,Friday,Saturday and Sunday. 12/24/20   [provider]  furosemide (LASIX) 20 MG tablet Take 1 tablet (20 mg total) by mouth daily. Take an extra tablet (20 mg) as needed 03/25/21   Sueanne Margarita, MD  levocetirizine (XYZAL) 5 MG tablet Take 1  tablet by mouth daily as needed for allergies. 12/10/20   [provider]  levothyroxine (SYNTHROID) 25 MCG tablet Take 25 mcg by mouth daily before breakfast.    [provider]  LORazepam (ATIVAN) 0.5 MG tablet TAKE 1 TABLET (0.5 MG TOTAL) BY MOUTH AT BEDTIME. 09/04/19   [place], Betty G, MD  methotrexate (RHEUMATREX) 2.5 MG tablet Take 5 mg by mouth every Tuesday. 12/24/20   [provider]  omeprazole (PRILOSEC) 20 MG capsule Take 20 mg by mouth daily.    [provider]  rosuvastatin (CRESTOR) 10 MG tablet Take 1 tablet (10 mg total) by mouth daily. 03/13/21   Caren Griffins, MD    Allergies    Ace inhibitors, Atorvastatin, Diltiazem hcl, Duloxetine hcl, Lisinopril,  Losartan, Tramadol hcl, Codeine, and Tramadol  Review of Systems   Review of Systems  All other systems reviewed and are negative.  Physical Exam Updated Vital Signs BP (!) 150/90   Pulse 70   Temp 98.3 F (36.8 C) (Oral)   Resp 18   Ht 5\' 4"  (1.626 m)   Wt 81.6 kg   SpO2 96%   BMI 30.90 kg/m   Physical Exam Vitals and nursing note reviewed.  Constitutional:      General: She is not in acute distress.    Appearance: She is well-developed. She is not ill-appearing, toxic-appearing or diaphoretic.  HENT:     Head: Normocephalic and atraumatic.     Right Ear: External ear normal.     Left Ear: External ear normal.     Mouth/Throat:     Mouth: Mucous membranes are moist.  Eyes:     Conjunctiva/sclera: Conjunctivae normal.     Pupils: Pupils are equal, round, and reactive to light.  Neck:     Trachea: Phonation normal.  Cardiovascular:     Rate and Rhythm: Normal rate and regular rhythm.     Heart sounds: Normal heart sounds.  Pulmonary:     Effort: Pulmonary effort is normal. No respiratory distress.     Breath sounds: Normal breath sounds. No stridor.  Abdominal:     General: There is no distension.     Palpations: Abdomen is soft.     Tenderness: There is abdominal  tenderness (Diffuse, mild).  Genitourinary:    Comments: External genitalia are atrophic consistent with age.  Patient examined in frog-leg position on stretcher.  Perineal tissue/external vaginal tissue are tender to palpation, an attempt to visualize by distracting the labia.  There appears to be dark blood coming from the vagina.  I assisted the nurse to catheterize the urethral meatus to obtain urine sample.  Clear yellow urine was obtained with this maneuver.  External anus appeared normal.  There is no blood coming from the anus. Musculoskeletal:        General: Normal range of motion.     Cervical back: Normal range of motion and neck supple.  Skin:    General: Skin is warm and dry.  Neurological:     Mental Status: She is alert and oriented to person, place, and time.     Cranial Nerves: No cranial nerve deficit.     Sensory: No sensory deficit.     Motor: No abnormal muscle tone.     Coordination: Coordination normal.  Psychiatric:        Mood and Affect: Mood normal.        Behavior: Behavior normal.        Thought Content: Thought content normal.        Judgment: Judgment normal.    ED Results / Procedures / Treatments   Labs (all labs ordered are listed, but only abnormal results are displayed) Labs Reviewed  CBC WITH DIFFERENTIAL/PLATELET - Abnormal; Notable for the following components:      Result Value   RBC 3.62 (*)    MCV 106.6 (*)    All other components within normal limits  COMPREHENSIVE METABOLIC PANEL - Abnormal; Notable for the following components:   Glucose, Bld 112 (*)    Calcium 10.5 (*)    GFR, Estimated 57 (*)    All other components within normal limits  URINALYSIS, ROUTINE W REFLEX MICROSCOPIC - Abnormal; Notable for the following components:   APPearance HAZY (*)  Hgb urine dipstick MODERATE (*)    Ketones, ur 5 (*)    All other components within normal limits  URINE CULTURE  RESP PANEL BY RT-PCR (FLU A&B, COVID) ARPGX2  LIPASE, BLOOD   APTT  PROTIME-INR  TYPE AND SCREEN  ABO/RH    EKG EKG Interpretation  Date/Time:  Sunday May 04 2021 17:18:43 EDT Ventricular Rate:  70 PR Interval:    QRS Duration: 168 QT Interval:  484 QTC Calculation: 522 R Axis:   -40 Text Interpretation: Ventricular-paced rhythm Abnormal ECG since last tracing no significant change Confirmed by Daleen Bo [phone]) on 05/04/2021 5:52:28 PM  Radiology CT Abdomen Pelvis W Contrast  Result Date: 05/04/2021 CLINICAL DATA:  Abdominal abscess/infection suspected EXAM: CT ABDOMEN AND PELVIS WITH CONTRAST TECHNIQUE: Multidetector CT imaging of the abdomen and pelvis was performed using the standard protocol following bolus administration of intravenous contrast. CONTRAST:  63mL OMNIPAQUE IOHEXOL 350 MG/ML SOLN COMPARISON:  Chest CT November 01, 2019. FINDINGS: Lower chest: On patchy subpleural reticulations and ground-glass in the lung bases. Cardiomegaly. Mitral annular calcifications. Coronary artery calcifications. Partially visualized intracardiac leads. Hepatobiliary: Calcified hepatic granuloma. No suspicious hepatic lesion. Gallbladder is unremarkable. No biliary ductal dilation. Pancreas: Within normal limits. Spleen: Splenic cleft.  Normal size spleen. Adrenals/Urinary Tract: Bilateral adrenal glands are unremarkable. Hypodense 2.6 cm cyst in the interpolar right kidney. No hydronephrosis. Urinary bladder is grossly unremarkable degree of distension. Stomach/Bowel: Small hiatal hernia, otherwise stomach is decompressed limiting evaluation. Normal position of the duodenum/ligament of [place]. No pathologic dilation of small bowel. The appendix is not definitely visualized however there is no pericecal inflammation. Terminal is unremarkable. Left-sided colonic diverticulosis without findings of acute diverticulitis. Vascular/Lymphatic: Aortic atherosclerosis without aneurysmal dilation. No pathologically enlarged abdominal or pelvic lymph nodes.  Reproductive: Enhancing nodular focus along the posterior wall of the uterus measuring approximately 13 mm on image 121/6 with distension of the endometrial canal measuring 4.2 cm. No suspicious adnexal lesions. Other: No abdominopelvic ascites. Fat containing left inguinal hernia. Pelvic floor laxity Musculoskeletal: Levoconvex curvature of thoracolumbar spine. Multilevel degenerative changes spine. No acute osseous abnormality. IMPRESSION: 1. Enhancing nodular focus along the posterior wall of the uterus measuring approximately 13 mm with distension of the endometrial canal measuring 4.2 cm. Further evaluation with pelvic ultrasound is recommended. 2. Left-sided colonic diverticulosis without findings of acute diverticulitis. 3. Similar patchy subpleural reticulations and ground-glass in the lung bases, which may represent interstitial lung disease. 4.  Aortic Atherosclerosis (ICD10-I70.0). Electronically Signed   By: Dahlia Bailiff MD   On: 05/04/2021 22:45    Procedures Procedures   Medications Ordered in ED Medications  sodium chloride (PF) 0.9 % injection (has no administration in time range)  acetaminophen (TYLENOL) tablet 650 mg (has no administration in time range)  iohexol (OMNIPAQUE) 350 MG/ML injection 80 mL (80 mLs Intravenous Contrast Given 05/04/21 2156)    ED Course  I have reviewed the triage vital signs and the nursing notes.  Pertinent labs & imaging results that were available during my care of the patient were reviewed by me and considered in my medical decision making (see chart for details).    MDM Rules/Calculators/A&P                           Patient Vitals for the past 24 hrs:  BP Temp Temp src Pulse Resp SpO2 Height Weight  05/04/21 2212 (!) 150/90 -- -- 70 18 96 % -- --  05/04/21  2100 (!) 191/130 -- -- 72 19 99 % -- --  05/04/21 2030 (!) 182/109 -- -- 70 -- 97 % -- --  05/04/21 2012 (!) 168/107 -- -- 70 18 98 % -- --  05/04/21 1701 -- -- -- -- -- -- 5\' 4"   (1.626 m) 81.6 kg  05/04/21 1653 (!) 152/88 98.3 F (36.8 C) Oral 70 16 99 % -- --     Medical Decision Making:  This patient is presenting for evaluation of perineal bleeding, suspected to be from vagina, which does require a range of treatment options, and is a complaint that involves a high risk of morbidity and mortality. The differential diagnoses include vaginal infection, vaginal bleeding, neoplastic disease. I decided to review old records, and in summary elderly female presenting with persistent vaginal bleeding for less than 24 hours, from a suspected vaginal source.  No prior history of cancer or vaginal bleeding.  She is postmenopausal.  I obtained additional historical information from husband at bedside.  Clinical Laboratory Tests Ordered, included CBC, Metabolic panel, and INR, type and screen, viral panel, lipase, urinalysis . Review indicates normal except glucose high, calcium high, MCV high, urinalysis with hemoglobin and ketones. Radiologic Tests Ordered, included CT abdomen pelvis.  I independently Visualized: Radiographic images, which show abnormal uterus, likely source of bleeding.    Critical Interventions-clinical evaluation, laboratory testing, CT imaging, medication treatment, observation reassessment  After These Interventions, the Patient was reevaluated and was found with symptomatic uterine abnormality for bleeding and pain.  CRITICAL CARE-no Performed by: Daleen Bo  Nursing Notes Reviewed/ Care Coordinated Applicable Imaging Reviewed Interpretation of Laboratory Data incorporated into ED treatment  Plan Dr. Sedonia Small will contact GYN for recommendations for further care and treatment    Final Clinical Impression(s) / ED Diagnoses Final diagnoses:  Vaginal bleeding    Rx / DC Orders ED Discharge Orders     None        Daleen Bo, MD 05/04/21 2302

## 2021-05-05 ENCOUNTER — Encounter: Payer: Self-pay | Admitting: Gynecologic Oncology

## 2021-05-05 ENCOUNTER — Telehealth: Payer: Self-pay | Admitting: *Deleted

## 2021-05-05 NOTE — Telephone Encounter (Signed)
Spoke with the patient and scheduled a new patient appt for tomorrow. Patient to see Dr Berline Lopes at 11:15 am and was given the arrival time of 10:45 am. Patient given the address and phone number for the clinic, along with the policy for mask and visitors

## 2021-05-06 ENCOUNTER — Other Ambulatory Visit: Payer: Self-pay

## 2021-05-06 ENCOUNTER — Inpatient Hospital Stay: Payer: Medicare HMO | Attending: Gynecologic Oncology | Admitting: Gynecologic Oncology

## 2021-05-06 ENCOUNTER — Encounter: Payer: Self-pay | Admitting: Gynecologic Oncology

## 2021-05-06 VITALS — BP 144/84 | HR 70 | Temp 98.6°F | Resp 18 | Ht 64.0 in | Wt 181.0 lb

## 2021-05-06 DIAGNOSIS — I447 Left bundle-branch block, unspecified: Secondary | ICD-10-CM | POA: Insufficient documentation

## 2021-05-06 DIAGNOSIS — Z950 Presence of cardiac pacemaker: Secondary | ICD-10-CM | POA: Insufficient documentation

## 2021-05-06 DIAGNOSIS — E039 Hypothyroidism, unspecified: Secondary | ICD-10-CM | POA: Insufficient documentation

## 2021-05-06 DIAGNOSIS — R6881 Early satiety: Secondary | ICD-10-CM | POA: Insufficient documentation

## 2021-05-06 DIAGNOSIS — I351 Nonrheumatic aortic (valve) insufficiency: Secondary | ICD-10-CM | POA: Insufficient documentation

## 2021-05-06 DIAGNOSIS — N950 Postmenopausal bleeding: Secondary | ICD-10-CM | POA: Diagnosis not present

## 2021-05-06 DIAGNOSIS — Z8673 Personal history of transient ischemic attack (TIA), and cerebral infarction without residual deficits: Secondary | ICD-10-CM | POA: Diagnosis not present

## 2021-05-06 DIAGNOSIS — R3989 Other symptoms and signs involving the genitourinary system: Secondary | ICD-10-CM | POA: Diagnosis not present

## 2021-05-06 DIAGNOSIS — I4819 Other persistent atrial fibrillation: Secondary | ICD-10-CM | POA: Diagnosis not present

## 2021-05-06 DIAGNOSIS — Z7901 Long term (current) use of anticoagulants: Secondary | ICD-10-CM | POA: Diagnosis not present

## 2021-05-06 DIAGNOSIS — I509 Heart failure, unspecified: Secondary | ICD-10-CM | POA: Diagnosis not present

## 2021-05-06 DIAGNOSIS — I340 Nonrheumatic mitral (valve) insufficiency: Secondary | ICD-10-CM | POA: Insufficient documentation

## 2021-05-06 DIAGNOSIS — R351 Nocturia: Secondary | ICD-10-CM | POA: Insufficient documentation

## 2021-05-06 DIAGNOSIS — N858 Other specified noninflammatory disorders of uterus: Secondary | ICD-10-CM | POA: Diagnosis not present

## 2021-05-06 DIAGNOSIS — I495 Sick sinus syndrome: Secondary | ICD-10-CM | POA: Diagnosis not present

## 2021-05-06 DIAGNOSIS — I420 Dilated cardiomyopathy: Secondary | ICD-10-CM | POA: Insufficient documentation

## 2021-05-06 DIAGNOSIS — E213 Hyperparathyroidism, unspecified: Secondary | ICD-10-CM | POA: Insufficient documentation

## 2021-05-06 DIAGNOSIS — M940 Chondrocostal junction syndrome [Tietze]: Secondary | ICD-10-CM | POA: Diagnosis not present

## 2021-05-06 DIAGNOSIS — E785 Hyperlipidemia, unspecified: Secondary | ICD-10-CM | POA: Diagnosis not present

## 2021-05-06 DIAGNOSIS — Z79899 Other long term (current) drug therapy: Secondary | ICD-10-CM | POA: Insufficient documentation

## 2021-05-06 DIAGNOSIS — R140 Abdominal distension (gaseous): Secondary | ICD-10-CM | POA: Diagnosis not present

## 2021-05-06 DIAGNOSIS — R9389 Abnormal findings on diagnostic imaging of other specified body structures: Secondary | ICD-10-CM | POA: Diagnosis not present

## 2021-05-06 DIAGNOSIS — I130 Hypertensive heart and chronic kidney disease with heart failure and stage 1 through stage 4 chronic kidney disease, or unspecified chronic kidney disease: Secondary | ICD-10-CM | POA: Insufficient documentation

## 2021-05-06 DIAGNOSIS — N183 Chronic kidney disease, stage 3 unspecified: Secondary | ICD-10-CM | POA: Diagnosis not present

## 2021-05-06 LAB — URINE CULTURE: Culture: NO GROWTH

## 2021-05-06 NOTE — Patient Instructions (Addendum)
It was a pleasure to meet you today.  I will call you with your biopsy results once we have them.  As we discussed, if the biopsy confirms that this is precancer or cancer of the uterus, then the standard of care treatment is surgery.  Due to your cardiac disease, I have some concerns about the safety of surgery.  We can work with your cardiologist to determine whether it would be safe for you to have surgery.  Other treatment options that we discussed include progesterone therapy and radiation.  If the biopsy only shows blood (no tissue), then I will talk to your cardiologist about doing a D&C (more sampling) under some local anesthesia and very light sedation in the outpatient surgery center.

## 2021-05-06 NOTE — Progress Notes (Signed)
GYNECOLOGIC ONCOLOGY NEW PATIENT CONSULTATION   Patient Name: Molly [name]  Patient Age: 87 y.o. Date of Service: 05/06/21 Referring Provider: Dr. Sedonia Small (Emergency Medicine)  Primary Care Provider: Harlan Stains, MD Consulting Provider: Jeral Pinch, MD   Assessment/Plan:  Postmenopausal patient with significant cardiac history presenting with thickened endometrial lining and postmenopausal bleeding concerning for endometrial cancer.  I discussed with the patient and her daughter today findings from her CT scan in the emergency department.  I am concerned that her lining is thickened and there is an area of nodularity.  These findings raise the suspicion for endometrial cancer.  Luckily, at least by her CT scan, there is not obvious evidence of metastatic disease if this is in fact uterine cancer.  In the setting of her age and postmenopausal bleeding, I recommend tissue biopsy for diagnosis.  Endometrial biopsy was performed today with what appeared to be mostly old blood.  I discussed that it is possible that there will not be any or sufficient tissue for diagnosis in this specimen.  If this is the case, then I will reach out to the patient's cardiologist to see if an outpatient procedure under some light sedation and local anesthesia to perform a dilation and curettage would be possible.  I am concerned that in the setting of endometrial cancer, the patient is not a great surgical candidate with her cardiac history.  We discussed treatment of endometrial cancer and that the standard of care is surgical staging.  In patients who cannot tolerate surgery, we used either progesterone therapy or radiation.  If biopsy confirms endometrial cancer, my recommendation would be to proceed with progesterone therapy in the form of a Mirena IUD.  We discussed that this method of progestin delivery is associated with similar or improved results compared to oral progesterone and limits systemic side  effects.  Once her biopsy is back, we will discuss further the need for any additional sampling.  Once diagnosis is confirmed, then I will reach out to her cardiologist to help stratify her risk for any minor or major surgery.  We will develop a treatment plan based on her fitness for surgery.  A copy of this note was sent to the patient's referring provider.   65 minutes of total time was spent for this patient encounter, including preparation, face-to-face counseling with the patient and coordination of care, and documentation of the encounter.  Jeral Pinch, MD  Division of Gynecologic Oncology  Department of Obstetrics and Gynecology  [facility]  ___________________________________________  Chief Complaint: Chief Complaint  Patient presents with   Uterine mass    History of Present Illness:  Molly [name] is a 87 y.o. y.o. female who is seen in consultation at the request of Dr. Sedonia Small for an evaluation of postmenopausal bleeding.  The patient reports that this past Saturday afternoon/evening, she began having light vaginal spotting.  This was the first postmenopausal bleeding that she had since going through menopause.  The bleeding slowly increased and then overnight on Saturday night, she had an episode of urinary incontinence in bed with what she describes as a lot of blood.  She denies any passage of clots.  She presented to the emergency department on Sunday with some continued minor bleeding.  She denies having any bleeding last night, and today has had just some spots on her pad.  She denies any associated cramping, pain.  She reports decreased appetite over the last several months although notes she is  only lost a few pounds.  She has to make herself eat.  She endorses some early satiety as well as abdominal bloating.  She endorses regular bowel movements with occasional constipation.  She has some nocturia, getting up several times a night to  urinate.  She wears a pad when she is out of the house more for security reasons and notes only occasional small urinary incontinence.  Patient presents with her daughter, Molly [name], today.   Past medical history is notable for back pain (she has been diagnosed with costo-iliac impingement syndrome and is doing therapy for this).  She also has atrial fibrillation on Eliquis, tachybradycardia syndrome with a pacemaker in place, left bundle branch block, chronic heart failure, and hypertension.  Her last echo was in March of this year and showed mildly reduced LVEF with an ejection fraction of 40-45% with mildly thickened heart muscle, severely enlarged left atrium, mildly calcified atrial valve.  She also has hyperlipidemia, stage III chronic kidney disease, hypothyroidism, hyperparathyroidism, and was recently diagnosed with a TIA during a hospital admission in mid May.  She endorses some shortness of breath with ambulation.  She has increasing difficulty getting around and uses a walker all of the time.  She goes through periods of time where she is quite fatigued and find it difficult to do some daily activities.  She lives in [place] with her husband.  She describes herself as mostly homebound and feeling "worn out" if she leaves the house.  She denies any tobacco or alcohol use.  PAST MEDICAL HISTORY:  Past Medical History:  Diagnosis Date   Aortic insufficiency    Atrial tachycardia (HCC)    Cancer (HCC)    Skin cancer- basal.  1 mylenoma   Chronic combined systolic and diastolic CHF (congestive heart failure) (HCC)    CKD (chronic kidney disease), stage III (HCC)    Complication of anesthesia    DCM (dilated cardiomyopathy) ([place])    EF 40-45% by echo 2017 - nuclear stress test with no ischemia   GERD (gastroesophageal reflux disease)    H/O benign essential tremor    on amiodarone resolved off amio   H/O cardiac radiofrequency ablation    a. AV node ablation in 05/2019.   H/O  hyperkalemia    Resolved off ACE inhibitors   High cholesterol    History of blood transfusion    Hyperkalemia    Hyperparathyroidism ([place])    Hypertension    Mitral regurgitation    trivial by echo 12/2020   Persistent atrial fibrillation (HCC)    PONV (postoperative nausea and vomiting)    PVC's (premature ventricular contractions)    Tachycardia-bradycardia syndrome ([place])    s/p PPM 02/2013     PAST SURGICAL HISTORY:  Past Surgical History:  Procedure Laterality Date   AMPUTATION Right 11/27/2014   Procedure: RIGHT 2ND AND 3RD TOE AMPUTATION;  Surgeon: Wylene Simmer, MD;  Location: [place];  Service: Orthopedics;  Laterality: Right;   APPENDECTOMY     performed at the time of one of her c-sections   ATRIAL FIBRILLATION ABLATION N/A 05/29/2019   Procedure: AVN ABLATION;  Surgeon: Evans Lance, MD;  Location: [facility];  Service: Cardiovascular;  Laterality: N/A;   BIV UPGRADE N/A 09/27/2019   Procedure: BIV UPGRADE;  Surgeon: Evans Lance, MD;  Location: [facility];  Service: Cardiovascular;  Laterality: N/A;   BUNIONECTOMY     Right   CARDIAC CATHETERIZATION  09/25/2000   EF  of 50% -- with normal left ventricular size and function   CARDIOVERSION N/A 12/28/2017   Procedure: CARDIOVERSION;  Surgeon: Dorothy Spark, MD;  Location: [facility];  Service: Cardiovascular;  Laterality: N/A;   CARDIOVERSION N/A 02/25/2018   Procedure: CARDIOVERSION;  Surgeon: Skeet Latch, MD;  Location: [place];  Service: Cardiovascular;  Laterality: N/A;   [place] Bilateral    Cataract   FOOT SURGERY     INSERT / REPLACE / REMOVE PACEMAKER  02/23/2013   PACEMAKER INSERTION  02/23/2013   PERMANENT PACEMAKER INSERTION N/A 03/02/2013   Procedure: PERMANENT PACEMAKER INSERTION;  Surgeon: Evans Lance, MD;  Location: [facility];  Service: Cardiovascular;  Laterality: N/A;   SHOULDER ARTHROSCOPY W/ ROTATOR CUFF REPAIR      SHOULDER SURGERY Right    roto cuff   TOE AMPUTATION Left    2nd and 3rd, bunion under toes.   TONSILLECTOMY     VARICOSE VEIN SURGERY      OB/GYN HISTORY:  OB History  Gravida Para Term Preterm AB Living  3 3          SAB IAB Ectopic Multiple Live Births               # Outcome Date GA Lbr Len/2nd Weight Sex Delivery Anes PTL Lv  3 Para           2 Para           1 Para             No LMP recorded. Patient is postmenopausal.  Age at menarche: 43 Age at menopause: Thinks around her mid 77s.  She remembers being on estrogen for heavy bleeding later in her reproductive years.  She still had a menses afterward but much less heavy.  When her menses stopped, she came off the estrogen, which she thinks was in her early 20s. Hx of HRT: As noted above Hx of STDs: Denies Last pap: 20 years ago History of abnormal pap smears: Denies  SCREENING STUDIES:  Last mammogram: 15 years ago  Last colonoscopy: Approximately 15 years ago  MEDICATIONS: Outpatient Encounter Medications as of 05/06/2021  Medication Sig   acetaminophen (TYLENOL) 500 MG tablet Take 1,000 mg by mouth 2 (two) times daily. For pain   apixaban (ELIQUIS) 5 MG TABS tablet Take 1 tablet (5 mg total) by mouth 2 (two) times daily.   dorzolamide-timolol (COSOPT) 22.3-6.8 MG/ML ophthalmic solution Place 1 drop into both eyes 2 (two) times daily.   furosemide (LASIX) 20 MG tablet Take 1 tablet (20 mg total) by mouth daily. Take an extra tablet (20 mg) as needed   levocetirizine (XYZAL) 5 MG tablet Take 1 tablet by mouth daily as needed for allergies.   levothyroxine (SYNTHROID) 25 MCG tablet Take 25 mcg by mouth daily before breakfast.   LORazepam (ATIVAN) 0.5 MG tablet TAKE 1 TABLET (0.5 MG TOTAL) BY MOUTH AT BEDTIME.   omeprazole (PRILOSEC) 20 MG capsule Take 20 mg by mouth daily.   rosuvastatin (CRESTOR) 5 MG tablet Take 5 mg by mouth daily.   [DISCONTINUED] rosuvastatin (CRESTOR) 10 MG tablet Take 1 tablet (10 mg  total) by mouth daily.   latanoprost (XALATAN) 0.005 % ophthalmic solution 1 drop at bedtime.   [DISCONTINUED] folic acid (FOLVITE) 1 MG tablet Take 1 tablet by mouth as directed. 6x a week Monday,Wednesday,Thursday,Friday,Saturday and Sunday.   [DISCONTINUED] methotrexate (RHEUMATREX) 2.5  MG tablet Take 5 mg by mouth every Tuesday.   No facility-administered encounter medications on file as of 05/06/2021.    ALLERGIES:  Allergies  Allergen Reactions   Ace Inhibitors     hyperkalemia   Atorvastatin Other (See Comments)    Other reaction(s): myalgia, Other (See Comments)   Diltiazem Hcl Other (See Comments)    Other reaction(s): made patient feel terrible, Other (See Comments)   Duloxetine Hcl Other (See Comments)    Other reaction(s): dizziness/nausea, Other (See Comments)   Lisinopril Other (See Comments)   Losartan     hyperkalemia   Codeine Nausea Only   Tramadol Nausea Only     FAMILY HISTORY:  Family History  Problem Relation Age of Onset   Hypertension Mother    Hypertension Father    Breast cancer Sister    Colon cancer Child    Hypercalcemia Neg Hx      SOCIAL HISTORY:  Social Connections: Not on file    REVIEW OF SYSTEMS:  Pertinent positives as per HPI Denies appetite changes, fevers, chills, fatigue, unexplained weight changes. Denies hearing loss, neck lumps or masses, mouth sores, ringing in ears or voice changes. Denies cough or wheezing.   Denies chest pain or palpitations. Denies leg swelling. Denies abdominal distention, pain, blood in stools, constipation, diarrhea, nausea, vomiting, or early satiety. Denies pain with intercourse, dysuria, frequency, hematuria or incontinence. Denies hot flashes, pelvic pain, or vaginal discharge.   Denies joint pain, back pain or muscle pain/cramps. Denies itching, rash, or wounds. Denies dizziness, headaches, numbness or seizures. Denies swollen lymph nodes or glands, denies easy bruising or bleeding. Denies  anxiety, depression, confusion, or decreased concentration.  Physical Exam:  Vital Signs for this encounter:  Blood pressure (!) 144/84, pulse 70, temperature 98.6 F (37 C), temperature source Oral, resp. rate 18, height 5\' 4"  (1.626 m), weight 181 lb (82.1 kg), SpO2 98 %. Body mass index is 31.07 kg/m. General: Alert, oriented, no acute distress.  HEENT: Normocephalic, atraumatic. Sclera anicteric.  Chest: Clear to auscultation bilaterally. No wheezes, rhonchi, or rales. Cardiovascular: Regular rate and rhythm, no murmurs, rubs, or gallops.  Abdomen: Normoactive bowel sounds. Soft, nondistended, nontender to palpation. No masses or hepatosplenomegaly appreciated. No palpable fluid wave.  Well-healed incision. Extremities: Grossly normal range of motion. Warm, well perfused.  Trace edema bilaterally.  Skin: No rashes or lesions.  Lymphatics: No cervical, supraclavicular, or inguinal adenopathy.  GU:  Normal external female genitalia. No lesions.  Mild atrophy noted.             Bladder/urethra:  No lesions or masses, well supported bladder             Vagina: Mildly atrophic vaginal mucosa, minimal blood within the vault that is older appearing and brown.             Cervix: Normal appearing, no lesions.  Mildly atrophic.             Uterus: 8-10 cm, mobile, no parametrial involvement or nodularity.             Adnexa: No masses appreciated.  Rectal: No nodularity.  Endometrial biopsy procedure Preoperative diagnosis: Postmenopausal bleeding, thickened endometrium Postoperative diagnosis: Same as above Physician: Berline Lopes MD Specimens: Endometrial biopsy Estimated blood loss: Minimal Procedure in detail: After the procedure was discussed with the patient including the risks and the benefits, verbal consent was obtained.  Patient was then placed in dorsolithotomy position and a speculum was placed in the  vagina.  The cervix was well visualized.  It was cleansed with Betadine x3.  A  single-tooth tenaculum was placed on the anterior lip of the cervix.  An endometrial Pipelle was then passed through the cervix into the uterine cavity to a depth of 8.5 cm without difficulty.  3 passes were performed with what appeared to be old blood, minimal tissue.  This was placed in formalin to be sent to pathology.  Tenaculum was removed from the cervix and tenaculum sites noted to be hemostatic.  All instruments were removed from the vagina.  Overall the patient tolerated the procedure well.  LABORATORY AND RADIOLOGIC DATA:  Outside medical records were reviewed to synthesize the above history, along with the history and physical obtained during the visit.   Lab Results  Component Value Date   WBC 6.3 05/04/2021   HGB 12.1 05/04/2021   HCT 38.6 05/04/2021   PLT 193 05/04/2021   GLUCOSE 112 (H) 05/04/2021   CHOL 219 (H) 03/11/2021   TRIG 176 (H) 03/11/2021   HDL 67 03/11/2021   LDLCALC 117 (H) 03/11/2021   ALT 13 05/04/2021   AST 23 05/04/2021   NA 140 05/04/2021   K 4.0 05/04/2021   CL 107 05/04/2021   CREATININE 0.98 05/04/2021   BUN 23 05/04/2021   CO2 22 05/04/2021   TSH 3.678 03/11/2021   INR 1.1 05/04/2021   HGBA1C 5.3 03/11/2021   MICROALBUR 4.0 (H) 04/26/2019   CT A/P on 7/10: Stomach/Bowel: Small hiatal hernia, otherwise stomach is decompressed limiting evaluation. Normal position of the duodenum/ligament of [place]. No pathologic dilation of small bowel. The appendix is not definitely visualized however there is no pericecal inflammation. Terminal is unremarkable. Left-sided colonic diverticulosis without findings of acute diverticulitis.   Vascular/Lymphatic: Aortic atherosclerosis without aneurysmal dilation. No pathologically enlarged abdominal or pelvic lymph nodes.   Reproductive: Enhancing nodular focus along the posterior wall of the uterus measuring approximately 13 mm on image 121/6 with distension of the endometrial canal measuring 4.2 cm. No  suspicious adnexal lesions.   Other: No abdominopelvic ascites. Fat containing left inguinal hernia. Pelvic floor laxity  IMPRESSION: 1. Enhancing nodular focus along the posterior wall of the uterus measuring approximately 13 mm with distension of the endometrial canal measuring 4.2 cm. Further evaluation with pelvic ultrasound is recommended. 2. Left-sided colonic diverticulosis without findings of acute diverticulitis. 3. Similar patchy subpleural reticulations and ground-glass in the lung bases, which may represent interstitial lung disease. 4.  Aortic Atherosclerosis (ICD10-I70.0).

## 2021-05-08 ENCOUNTER — Telehealth: Payer: Self-pay | Admitting: Gynecologic Oncology

## 2021-05-08 DIAGNOSIS — R9389 Abnormal findings on diagnostic imaging of other specified body structures: Secondary | ICD-10-CM

## 2021-05-08 LAB — SURGICAL PATHOLOGY

## 2021-05-08 NOTE — Telephone Encounter (Signed)
I called the patient to review recent endometrial biopsy.  Atypical glandular cells noted, suspicious for adenocarcinoma.  I discussed my recommendations, which would be to move forward with definitive surgery if we get clearance from cardiology and if the patient is amenable to having major surgery.  We discussed what the surgery would entail.  She has already had a CT scan that ruled out any clear evidence of metastatic disease.  She has a significant cardiac history and she has become fairly homebound and deconditioned.  She has significant concerns about being able to tolerate surgery, which I appreciate.  I will have my office fax clearance paperwork to her cardiologist.  I would like to see her in about 2 weeks to discuss her thoughts about surgery.  Hopefully at that time, we will have heard from cardiology what her risk is and whether she needs any additional testing before surgery if she is a candidate at an acceptable risk.  If she is deemed too high risk or she does not want to proceed with major surgery, then we discussed the option of doing a dilation and curettage to establish a diagnosis.  I can plan to send the specimen for intraoperative review.  If this looks like an endometrioid cancer, we could potentially place a Mirena IUD at that time for local treatment.  If the patient declines surgery but we confirm a cancer diagnosis, we discussed that progesterone or radiation therapy could be used pending determining the histology.  The patient has previously said that she would not be interested in chemotherapy or radiation given how frequently this would mean coming to the hospital and how weak she feels.  She is amenable to having a visit with me in about 2 weeks.  We will discuss further at that time after she has spent some time thinking about what she would like to do and we have determined her cardiac risk.  Jeral Pinch MD Gynecologic Oncology

## 2021-05-09 ENCOUNTER — Telehealth: Payer: Self-pay | Admitting: *Deleted

## 2021-05-09 DIAGNOSIS — I700 Atherosclerosis of aorta: Secondary | ICD-10-CM

## 2021-05-09 NOTE — Telephone Encounter (Signed)
Patient with diagnosis of afib on Eliquis for anticoagulation.    Procedure: D&C; SURGICAL STRATIFICATION/OPTIMIZATION Date of procedure: TBD  CHA2DS2-VASc Score = 8  This indicates a 10.8% annual risk of stroke. The patient's score is based upon: CHF History: Yes HTN History: Yes Diabetes History: No Stroke History: Yes Vascular Disease History: Yes Age Score: 2 Gender Score: 1  CAD not on problem list but aortic atherosclerosis noted on abdominal CT, problem list has been updated.  CrCl 56mL/min using adjusted body weight due to obesity Platelet count 193K  Pt is at elevated cardiovascular risk off of her Eliquis given elevated CHADS2VASc score and recent TIA 2 months ago. Recommend limiting time off of anticoagulation as much as possible with 24 hour Eliquis hold. If > 1 day hold is required, will need cardiologist clearance.

## 2021-05-09 NOTE — Telephone Encounter (Signed)
   Name: Molly [name]  DOB: 1933-04-29  MRN: [id number]   Primary Cardiologist: Fransico Him, MD  Chart reviewed as part of pre-operative protocol coverage.   Left a voicemail for patient to call back for ongoing preop assessment.   Abigail Butts, PA-C 05/09/2021, 4:35 PM

## 2021-05-09 NOTE — Telephone Encounter (Signed)
Per Dr Berline Lopes fax records and surgical stratification/optimization form to her cardiology    Patient called and scheduled a follow with Dr Berline Lopes on Monday

## 2021-05-09 NOTE — Telephone Encounter (Signed)
   [facility] Pre-operative Risk Assessment    Patient Name: Molly [name]  DOB: 12/16/1932 MRN: [id number]  HEARTCARE STAFF:  - IMPORTANT!!!!!! Under Visit Info/Reason for Call, type in Other and utilize the format Clearance MM/DD/YY or Clearance TBD. Do not use dashes or single digits. - Please review there is not already an duplicate clearance open for this procedure. - If request is for dental extraction, please clarify the # of teeth to be extracted. - If the patient is currently at the dentist's office, call Pre-Op Callback Staff (MA/nurse) to input urgent request.  - If the patient is not currently in the dentist office, please route to the Pre-Op pool.  Request for surgical clearance:  What type of surgery is being performed? D&C; SURGICAL STRATIFICATION/OPTIMIZATION  When is this surgery scheduled? TBD  What type of clearance is required (medical clearance vs. Pharmacy clearance to hold med vs. Both)? BOTH  Are there any medications that need to be held prior to surgery and how long?  [name] name and name of physician performing surgery? Molly [name]; DR. Jeral [name]  What is the office phone number? [phone]   7.   What is the office fax number? [phone]  8.   Anesthesia type (None, local, MAC, general) ? LIGHT SEDATION   Molly [name] 05/09/2021, 12:18 PM  _________________________________________________________________   (provider comments below)

## 2021-05-12 ENCOUNTER — Ambulatory Visit (HOSPITAL_BASED_OUTPATIENT_CLINIC_OR_DEPARTMENT_OTHER): Payer: Medicare HMO | Admitting: Gynecologic Oncology

## 2021-05-12 ENCOUNTER — Encounter: Payer: Self-pay | Admitting: Gynecologic Oncology

## 2021-05-12 ENCOUNTER — Other Ambulatory Visit: Payer: Self-pay

## 2021-05-12 ENCOUNTER — Inpatient Hospital Stay: Payer: Medicare HMO | Admitting: Gynecologic Oncology

## 2021-05-12 VITALS — BP 153/82 | HR 70 | Temp 97.4°F | Resp 18 | Ht 64.0 in | Wt 179.8 lb

## 2021-05-12 DIAGNOSIS — R9389 Abnormal findings on diagnostic imaging of other specified body structures: Secondary | ICD-10-CM

## 2021-05-12 DIAGNOSIS — N950 Postmenopausal bleeding: Secondary | ICD-10-CM | POA: Diagnosis not present

## 2021-05-12 DIAGNOSIS — N858 Other specified noninflammatory disorders of uterus: Secondary | ICD-10-CM

## 2021-05-12 DIAGNOSIS — I4819 Other persistent atrial fibrillation: Secondary | ICD-10-CM | POA: Diagnosis not present

## 2021-05-12 DIAGNOSIS — I509 Heart failure, unspecified: Secondary | ICD-10-CM | POA: Diagnosis not present

## 2021-05-12 DIAGNOSIS — I495 Sick sinus syndrome: Secondary | ICD-10-CM | POA: Diagnosis not present

## 2021-05-12 DIAGNOSIS — R6881 Early satiety: Secondary | ICD-10-CM | POA: Diagnosis not present

## 2021-05-12 DIAGNOSIS — R140 Abdominal distension (gaseous): Secondary | ICD-10-CM | POA: Diagnosis not present

## 2021-05-12 DIAGNOSIS — I447 Left bundle-branch block, unspecified: Secondary | ICD-10-CM | POA: Diagnosis not present

## 2021-05-12 DIAGNOSIS — M940 Chondrocostal junction syndrome [Tietze]: Secondary | ICD-10-CM | POA: Diagnosis not present

## 2021-05-12 DIAGNOSIS — R3989 Other symptoms and signs involving the genitourinary system: Secondary | ICD-10-CM | POA: Diagnosis not present

## 2021-05-12 DIAGNOSIS — R351 Nocturia: Secondary | ICD-10-CM | POA: Diagnosis not present

## 2021-05-12 NOTE — Progress Notes (Signed)
Patient here with her daughter for follow up with Dr. Jeral Pinch and for pre-operative discussion prior to her scheduled surgery on May 27, 2021. She is scheduled for dilation and curettage of the uterus, Mirena intrauterine device placement, possible hysteroscopy with Myosure. The surgery was discussed in detail.  See after visit summary for additional details. Visual aids used to discuss items related to surgery including sequential compression stockings, IV pump, multi-modal pain regimen including tylenol, and the female reproductive system to discuss surgery in detail.      Discussed post-op pain management in detail including the aspects of the enhanced recovery pathway. We discussed the use of tylenol post-op and to monitor for a maximum of 4,000 mg in a 24 hour period.     Discussed the use of SCDs and measures to take at home to prevent DVT including frequent mobility.  Reportable signs and symptoms of DVT discussed. Post-operative instructions discussed and expectations for after surgery.     5 minutes spent with the patient.  Verbalizing understanding of material discussed. No needs or concerns voiced at the end of the visit.   Advised patient and family to call for any needs.   Our office will reach out to her cardiologist for input in regards to risk with light anesthesia and to see if the procedure can be done with holding the Eliquis for one day or we can keep it going with no interruptions in therapy.   This appointment is included in the global surgical bundle as pre-operative teaching and has no charge.

## 2021-05-12 NOTE — Patient Instructions (Signed)
Preparing for your Surgery  Plan for surgery on May 27, 2021 with Dr. Jeral Pinch at [place] will be scheduled for a dilation and curettage of the uterus, Mirena intrauterine device placement, possible hysteroscopy with Myosure (using a camera to obtain sample from lining of the uterus).   Pre-operative Testing -You will receive a phone call from presurgical testing at [facility] to discuss surgery instructions and arrange for lab work if needed.  -Bring your insurance card, copy of an advanced directive if applicable, medication list.  -We will reach out to your cardiologist to see if Eliquis can be held or whether you can continue taking it before the procedure. We will let you know prior to surgery.  -Do not take supplements such as fish oil (omega 3), red yeast rice, turmeric before your surgery. You want to avoid medications with aspirin in them including headache powders such as BC or Goody's), Excedrin migraine.  Day Before Surgery at [place] will be advised you can have clear liquids up until 3 hours before your surgery.    Your role in recovery Your role is to become active as soon as directed by your doctor, while still giving yourself time to heal.  Rest when you feel tired. You will be asked to do the following in order to speed your recovery:  - Cough and breathe deeply. This helps to clear and expand your lungs and can prevent pneumonia after surgery.  - [place]. Do mild physical activity. Walking or moving your legs help your circulation and body functions return to normal. Do not try to get up or walk alone the first time after surgery.   -If you develop swelling on one leg or the other, pain in the back of your leg, redness/warmth in one of your legs, please call the office or go to the Emergency Room to have a doppler to rule out a blood clot. For shortness of breath, chest pain-seek care in the Emergency Room as soon  as possible. - Actively manage your pain. Managing your pain lets you move in comfort. We will ask you to rate your pain on a scale of zero to 10. It is your responsibility to tell your doctor or nurse where and how much you hurt so your pain can be treated.  Special Considerations -Your final pathology results from surgery should be available around one week after surgery and the results will be relayed to you when available.  -FMLA forms can be faxed to [phone] and please allow 5-7 business days for completion.  Pain Management After Surgery -Make sure that you have Tylenol to use on a regular basis after surgery for pain control.   Bowel Regimen -It is important to prevent constipation and drink adequate amounts of liquids.   Risks of Surgery Risks of surgery are low but include bleeding, infection, damage to surrounding structures, re-operation, blood clots, and very rarely death.  AFTER SURGERY INSTRUCTIONS  Return to work:  1-2 days if applicable  Activity: 1. Be up and out of the bed during the day.  Take a nap if needed.  You may walk up steps but be careful and use the hand rail.  Stair climbing will tire you more than you think, you may need to stop part way and rest.   2. No lifting or straining for 1 week over 10 pounds. No pushing, pulling, straining for 1 week.  3. No driving for minimum  24 hours after surgery.  Do not drive if you are taking narcotic pain medicine and make sure that your reaction time has returned.   4. You can shower as soon as the next day after surgery. Shower daily. No tub baths or submerging your body in water until cleared by your surgeon.   5. No sexual activity and nothing in the vagina for 4 weeks.  6. You may experience vaginal spotting and discharge after surgery.  The spotting is normal but if you experience heavy bleeding, call our office.  7. Take Tylenol for pain. Monitor your Tylenol intake to a max of 4,000 mg in a 24 hour  period.   Diet: 1. Low sodium Heart Healthy Diet is recommended but you are cleared to resume your normal (before surgery) diet after your procedure.  2. It is safe to use a laxative, such as Miralax or Colace, if you have difficulty moving your bowels.   Wound Care: 1. Keep clean and dry.  Shower daily.  Reasons to call the Doctor: Fever - Oral temperature greater than 100.4 degrees Fahrenheit Foul-smelling vaginal discharge Difficulty urinating Nausea and vomiting Difficulty breathing with or without chest pain New calf pain especially if only on one side Sudden, continuing increased vaginal bleeding with or without clots.   Contacts: For questions or concerns you should contact:  Dr. Jeral Pinch at [phone]  Joylene John, NP at [phone]  After Hours: call [phone] and have the GYN Oncologist paged/contacted (after 5 pm or on the weekends).  Messages sent via mychart are for non-urgent matters and are not responded to after hours so for urgent needs, please call the after hours number.

## 2021-05-12 NOTE — H&P (View-Only) (Signed)
Gynecologic Oncology Return Clinic Visit  05/12/21  Reason for Visit: treatment planning, discussion of surgery  Treatment History: 7/10: enhancing nodular focus along posterior uterine wall, 48mm, with distension of the endometrial canal measuring 4.2cm. No ascites or adenopathy.  7/15: EMB - rare minute fragments of atypical glandular epithelium suspicious for adenocarcinoma; florid papillary metaplasia  Interval History: The patient presents today with her daughter for discussion regarding neck steps in work-up/treatment.  She denies any significant difference in symptoms since she was here last week.  She reports doing well after her endometrial biopsy, denies any significant pain or cramping.  Denies any significant change in bleeding.  Past Medical/Surgical History: Past Medical History:  Diagnosis Date   Aortic insufficiency    Atrial tachycardia (HCC)    Cancer (HCC)    Skin cancer- basal.  1 mylenoma   Chronic combined systolic and diastolic CHF (congestive heart failure) (HCC)    CKD (chronic kidney disease), stage III (HCC)    Complication of anesthesia    DCM (dilated cardiomyopathy) ([place])    EF 40-45% by echo 2017 - nuclear stress test with no ischemia   GERD (gastroesophageal reflux disease)    H/O benign essential tremor    on amiodarone resolved off amio   H/O cardiac radiofrequency ablation    a. AV node ablation in 05/2019.   H/O hyperkalemia    Resolved off ACE inhibitors   High cholesterol    History of blood transfusion    Hyperkalemia    Hyperparathyroidism ([place])    Hypertension    Mitral regurgitation    trivial by echo 12/2020   Persistent atrial fibrillation (HCC)    PONV (postoperative nausea and vomiting)    PVC's (premature ventricular contractions)    Tachycardia-bradycardia syndrome ([place])    s/p PPM 02/2013    Past Surgical History:  Procedure Laterality Date   AMPUTATION Right 11/27/2014   Procedure: RIGHT 2ND AND 3RD TOE AMPUTATION;   Surgeon: Wylene Simmer, MD;  Location: [facility];  Service: Orthopedics;  Laterality: Right;   APPENDECTOMY     performed at the time of one of her c-sections   ATRIAL FIBRILLATION ABLATION N/A 05/29/2019   Procedure: AVN ABLATION;  Surgeon: Evans Lance, MD;  Location: [place] CV LAB;  Service: Cardiovascular;  Laterality: N/A;   BIV UPGRADE N/A 09/27/2019   Procedure: BIV UPGRADE;  Surgeon: Evans Lance, MD;  Location: [place] CV LAB;  Service: Cardiovascular;  Laterality: N/A;   BUNIONECTOMY     Right   CARDIAC CATHETERIZATION  09/25/2000   EF of 50% -- with normal left ventricular size and function   CARDIOVERSION N/A 12/28/2017   Procedure: CARDIOVERSION;  Surgeon: Dorothy Spark, MD;  [facility] ENDOSCOPY;  Service: Cardiovascular;  Laterality: N/A;   CARDIOVERSION N/A 02/25/2018   Procedure: CARDIOVERSION;  Surgeon: Skeet Latch, MD;  Location: [place];  Service: Cardiovascular;  Laterality: N/A;   [place] Bilateral    Cataract   FOOT SURGERY     INSERT / REPLACE / REMOVE PACEMAKER  02/23/2013   PACEMAKER INSERTION  02/23/2013   PERMANENT PACEMAKER INSERTION N/A 03/02/2013   Procedure: PERMANENT PACEMAKER INSERTION;  Surgeon: Evans Lance, MD;  Location[facility] CATH LAB;  Service: Cardiovascular;  Laterality: N/A;   SHOULDER ARTHROSCOPY W/ ROTATOR CUFF REPAIR     SHOULDER SURGERY Right    roto cuff   TOE AMPUTATION Left  2nd and 3rd, bunion under toes.   TONSILLECTOMY     VARICOSE VEIN SURGERY      Family History  Problem Relation Age of Onset   Hypertension Mother    Hypertension Father    Breast cancer Sister    Colon cancer Child    Hypercalcemia Neg Hx     Social History   Socioeconomic History   Marital status: Married    Spouse name: Not on file   Number of children: 3   Years of education: Not on file   Highest education level: Not on file  Occupational History   Not on file   Tobacco Use   Smoking status: Never   Smokeless tobacco: Never  Vaping Use   Vaping Use: Never used  Substance and Sexual Activity   Alcohol use: No   Drug use: No   Sexual activity: Not Currently  Other Topics Concern   Not on file  Social History Narrative   Not on file   Social Determinants of Health   Financial Resource Strain: Not on file  Food Insecurity: Not on file  Transportation Needs: Not on file  Physical Activity: Not on file  Stress: Not on file  Social Connections: Not on file    Current Medications:  Current Outpatient Medications:    acetaminophen (TYLENOL) 500 MG tablet, Take 1,000 mg by mouth 2 (two) times daily. For pain, Disp: , Rfl:    apixaban (ELIQUIS) 5 MG TABS tablet, Take 1 tablet (5 mg total) by mouth 2 (two) times daily., Disp: 60 tablet, Rfl: 11   dorzolamide-timolol (COSOPT) 22.3-6.8 MG/ML ophthalmic solution, Place 1 drop into both eyes 2 (two) times daily., Disp: , Rfl:    furosemide (LASIX) 20 MG tablet, Take 1 tablet (20 mg total) by mouth daily. Take an extra tablet (20 mg) as needed, Disp: 135 tablet, Rfl: 3   latanoprost (XALATAN) 0.005 % ophthalmic solution, 1 drop at bedtime., Disp: , Rfl:    levocetirizine (XYZAL) 5 MG tablet, Take 1 tablet by mouth daily as needed for allergies., Disp: , Rfl:    levothyroxine (SYNTHROID) 25 MCG tablet, Take 25 mcg by mouth daily before breakfast., Disp: , Rfl:    LORazepam (ATIVAN) 0.5 MG tablet, TAKE 1 TABLET (0.5 MG TOTAL) BY MOUTH AT BEDTIME., Disp: 30 tablet, Rfl: 2   omeprazole (PRILOSEC) 20 MG capsule, Take 20 mg by mouth daily., Disp: , Rfl:    rosuvastatin (CRESTOR) 5 MG tablet, Take 5 mg by mouth daily., Disp: , Rfl:   Review of Systems: Pertinent positives include leg swelling, vaginal bleeding, vaginal discharge, difficulty walking, joint pain, rash, easy bruising/bleeding. Denies appetite changes, fevers, chills, unexplained weight changes. Denies hearing loss, neck lumps or masses,  mouth sores, ringing in ears or voice changes. Denies cough or wheezing.  Denies shortness of breath. Denies chest pain or palpitations.  Denies abdominal distention, pain, blood in stools, constipation, diarrhea, nausea, vomiting, or early satiety. Denies pain with intercourse, dysuria, frequency, hematuria or incontinence. Denies hot flashes, pelvic pain.   Denies back pain or muscle pain/cramps. Denies itching or wounds. Denies dizziness, headaches, numbness or seizures. Denies swollen lymph nodes or glands. Denies anxiety, depression, confusion, or decreased concentration.  Physical Exam: BP (!) 153/82 (BP Location: Left Arm, Patient Position: Sitting)   Pulse 70   Temp (!) 97.4 F (36.3 C) (Tympanic)   Resp 18   Ht 5\' 4"  (1.626 m)   Wt 179 lb 12.8 oz (81.6 kg)  SpO2 97%   BMI 30.86 kg/m  General: Alert, oriented, no acute distress. HEENT: Atraumatic, normocephalic, sclera anicteric.  Laboratory & Radiologic Studies: A. ENDOMETRIUM, BIOPSY:  - Rare minute fragments of atypical glandular epithelium suspicious for  adenocarcinoma.  - Florid papillary metaplasia.   Assessment & Plan: Molly [name] is a 87 y.o. woman with suspected clinical stage I adenocarcinoma of the uterus who presents for treatment discussion.  I reviewed with the patient and her daughter biopsy results from last week, which show atypical glandular epithelium that is suspicious for adenocarcinoma although there is not enough tissue for definitive diagnosis.  Given her CT findings as well as biopsy results, I think that we can proceed with treatment planning as if we had a confirmed uterine cancer diagnosis.  I have spoken with the patient at her last visit as well as on the phone regarding her concern related to treatment.  From a quality of life standpoint as well as a frailty standpoint, she is not interested in adjuvant treatment such as chemotherapy or radiation.  We have discussed that standard of  care therapy for diagnosis of hyperplasia or malignancy of the uterus is surgery.  She has significant cardiac disease and given her frailty, she is not an ideal candidate for major surgery.  We have also discussed the option of minor surgery, in the form of a D&C and Mirena IUD placement.  The goal of such as surgery would be to confirm a diagnosis, decrease the risk of significant bleeding in the near future, and potentially offer treatment if cancer identified is one that we expect to be sensitive to hormonal therapy.  I reviewed that there are different types of endometrial cancer; one that is low-grade and endometrioid type would be much more likely to have a response to hormonal therapy.  An outpatient procedure would allow [place] to continue the patient on her anticoagulation or to stop it for a very brief period of time whereas hysterectomy would require stopping her anticoagulation for 2 days before and 2 days after surgery.  If the patient were to undergo hysterectomy, I reviewed the data that supports that there is no cancer survival benefit in lymph node assessment or removal.  The benefit comes from helping to determine stage as well as adjuvant treatment.  In someone who was not interested in pursuing adjuvant treatment options, at the patient underwent hysterectomy, I think performing total hysterectomy and BSO would help limit surgical risk, anesthesia time and be more appropriate given her desire not to receive chemotherapy or radiation.  After a long discussion and answering their questions, the patient would like to move forward with D&C and Mirena IUD insertion.  We have already reached out to her cardiologist, but will let them know that we plan to move forward with minor outpatient surgery.  This is a surgery that I am happy to do without disruption to her anticoagulation.  We will plan for D&C, possible hysteroscopy, Mirena IUD insertion in several weeks.  We discussed risks of surgery which  include but are not limited to bleeding, need for blood transfusion, infection, damage to surrounding tissues requiring repair, anesthesia risk (such as stroke, heart attack, VTE, and rarely death).  Perioperative instructions were reviewed with the patient and her daughter.  45 minutes of total time was spent for this patient encounter, including preparation, face-to-face counseling with the patient and coordination of care, and documentation of the encounter.  Jeral Pinch, MD  Division of Gynecologic Oncology  Department of Obstetrics and Gynecology  [facility]   

## 2021-05-12 NOTE — Progress Notes (Signed)
Gynecologic Oncology Return Clinic Visit  05/12/21  Reason for Visit: treatment planning, discussion of surgery  Treatment History: 7/10: enhancing nodular focus along posterior uterine wall, 34mm, with distension of the endometrial canal measuring 4.2cm. No ascites or adenopathy.  7/15: EMB - rare minute fragments of atypical glandular epithelium suspicious for adenocarcinoma; florid papillary metaplasia  Interval History: The patient presents today with her daughter for discussion regarding neck steps in work-up/treatment.  She denies any significant difference in symptoms since she was here last week.  She reports doing well after her endometrial biopsy, denies any significant pain or cramping.  Denies any significant change in bleeding.  Past Medical/Surgical History: Past Medical History:  Diagnosis Date   Aortic insufficiency    Atrial tachycardia (HCC)    Cancer (HCC)    Skin cancer- basal.  1 mylenoma   Chronic combined systolic and diastolic CHF (congestive heart failure) (HCC)    CKD (chronic kidney disease), stage III (HCC)    Complication of anesthesia    DCM (dilated cardiomyopathy) ([place])    EF 40-45% by echo 2017 - nuclear stress test with no ischemia   GERD (gastroesophageal reflux disease)    H/O benign essential tremor    on amiodarone resolved off amio   H/O cardiac radiofrequency ablation    a. AV node ablation in 05/2019.   H/O hyperkalemia    Resolved off ACE inhibitors   High cholesterol    History of blood transfusion    Hyperkalemia    Hyperparathyroidism ([place])    Hypertension    Mitral regurgitation    trivial by echo 12/2020   Persistent atrial fibrillation (HCC)    PONV (postoperative nausea and vomiting)    PVC's (premature ventricular contractions)    Tachycardia-bradycardia syndrome ([place])    s/p PPM 02/2013    Past Surgical History:  Procedure Laterality Date   AMPUTATION Right 11/27/2014   Procedure: RIGHT 2ND AND 3RD TOE AMPUTATION;   Surgeon: Wylene Simmer, MD;  Location: [place];  Service: Orthopedics;  Laterality: Right;   APPENDECTOMY     performed at the time of one of her c-sections   ATRIAL FIBRILLATION ABLATION N/A 05/29/2019   Procedure: AVN ABLATION;  Surgeon: Evans Lance, MD;  Location: [place] CV LAB;  Service: Cardiovascular;  Laterality: N/A;   BIV UPGRADE N/A 09/27/2019   Procedure: BIV UPGRADE;  Surgeon: Evans Lance, MD;  Location: [place] [facility];  Service: Cardiovascular;  Laterality: N/A;   BUNIONECTOMY     Right   CARDIAC CATHETERIZATION  09/25/2000   EF of 50% -- with normal left ventricular size and function   CARDIOVERSION N/A 12/28/2017   Procedure: CARDIOVERSION;  Surgeon: Dorothy Spark, MD;  Location: [facility] ENDOSCOPY;  Service: Cardiovascular;  Laterality: N/A;   CARDIOVERSION N/A 02/25/2018   Procedure: CARDIOVERSION;  Surgeon: Skeet Latch, MD;  Location: [place];  Service: Cardiovascular;  Laterality: N/A;   Buna Bilateral    Cataract   FOOT SURGERY     INSERT / REPLACE / REMOVE PACEMAKER  02/23/2013   PACEMAKER INSERTION  02/23/2013   PERMANENT PACEMAKER INSERTION N/A 03/02/2013   Procedure: PERMANENT PACEMAKER INSERTION;  Surgeon: Evans Lance, MD;  Location: [facility] CATH LAB;  Service: Cardiovascular;  Laterality: N/A;   SHOULDER ARTHROSCOPY W/ ROTATOR CUFF REPAIR     SHOULDER SURGERY Right    roto cuff   TOE AMPUTATION Left  2nd and 3rd, bunion under toes.   TONSILLECTOMY     VARICOSE VEIN SURGERY      Family History  Problem Relation Age of Onset   Hypertension Mother    Hypertension Father    Breast cancer Sister    Colon cancer Child    Hypercalcemia Neg Hx     Social History   Socioeconomic History   Marital status: Married    Spouse name: Not on file   Number of children: 3   Years of education: Not on file   Highest education level: Not on file  Occupational History   Not on file   Tobacco Use   Smoking status: Never   Smokeless tobacco: Never  Vaping Use   Vaping Use: Never used  Substance and Sexual Activity   Alcohol use: No   Drug use: No   Sexual activity: Not Currently  Other Topics Concern   Not on file  Social History Narrative   Not on file   Social Determinants of Health   Financial Resource Strain: Not on file  Food Insecurity: Not on file  Transportation Needs: Not on file  Physical Activity: Not on file  Stress: Not on file  Social Connections: Not on file    Current Medications:  Current Outpatient Medications:    acetaminophen (TYLENOL) 500 MG tablet, Take 1,000 mg by mouth 2 (two) times daily. For pain, Disp: , Rfl:    apixaban (ELIQUIS) 5 MG TABS tablet, Take 1 tablet (5 mg total) by mouth 2 (two) times daily., Disp: 60 tablet, Rfl: 11   dorzolamide-timolol (COSOPT) 22.3-6.8 MG/ML ophthalmic solution, Place 1 drop into both eyes 2 (two) times daily., Disp: , Rfl:    furosemide (LASIX) 20 MG tablet, Take 1 tablet (20 mg total) by mouth daily. Take an extra tablet (20 mg) as needed, Disp: 135 tablet, Rfl: 3   latanoprost (XALATAN) 0.005 % ophthalmic solution, 1 drop at bedtime., Disp: , Rfl:    levocetirizine (XYZAL) 5 MG tablet, Take 1 tablet by mouth daily as needed for allergies., Disp: , Rfl:    levothyroxine (SYNTHROID) 25 MCG tablet, Take 25 mcg by mouth daily before breakfast., Disp: , Rfl:    LORazepam (ATIVAN) 0.5 MG tablet, TAKE 1 TABLET (0.5 MG TOTAL) BY MOUTH AT BEDTIME., Disp: 30 tablet, Rfl: 2   omeprazole (PRILOSEC) 20 MG capsule, Take 20 mg by mouth daily., Disp: , Rfl:    rosuvastatin (CRESTOR) 5 MG tablet, Take 5 mg by mouth daily., Disp: , Rfl:   Review of Systems: Pertinent positives include leg swelling, vaginal bleeding, vaginal discharge, difficulty walking, joint pain, rash, easy bruising/bleeding. Denies appetite changes, fevers, chills, unexplained weight changes. Denies hearing loss, neck lumps or masses,  mouth sores, ringing in ears or voice changes. Denies cough or wheezing.  Denies shortness of breath. Denies chest pain or palpitations.  Denies abdominal distention, pain, blood in stools, constipation, diarrhea, nausea, vomiting, or early satiety. Denies pain with intercourse, dysuria, frequency, hematuria or incontinence. Denies hot flashes, pelvic pain.   Denies back pain or muscle pain/cramps. Denies itching or wounds. Denies dizziness, headaches, numbness or seizures. Denies swollen lymph nodes or glands. Denies anxiety, depression, confusion, or decreased concentration.  Physical Exam: BP (!) 153/82 (BP Location: Left Arm, Patient Position: Sitting)   Pulse 70   Temp (!) 97.4 F (36.3 C) (Tympanic)   Resp 18   Ht 5\' 4"  (1.626 m)   Wt 179 lb 12.8 oz (81.6 kg)  SpO2 97%   BMI 30.86 kg/m  General: Alert, oriented, no acute distress. HEENT: Atraumatic, normocephalic, sclera anicteric.  Laboratory & Radiologic Studies: A. ENDOMETRIUM, BIOPSY:  - Rare minute fragments of atypical glandular epithelium suspicious for  adenocarcinoma.  - Florid papillary metaplasia.   Assessment & Plan: Molly [name] is a 87 y.o. woman with suspected clinical stage I adenocarcinoma of the uterus who presents for treatment discussion.  I reviewed with the patient and her daughter biopsy results from last week, which show atypical glandular epithelium that is suspicious for adenocarcinoma although there is not enough tissue for definitive diagnosis.  Given her CT findings as well as biopsy results, I think that we can proceed with treatment planning as if we had a confirmed uterine cancer diagnosis.  I have spoken with the patient at her last visit as well as on the phone regarding her concern related to treatment.  From a quality of life standpoint as well as a frailty standpoint, she is not interested in adjuvant treatment such as chemotherapy or radiation.  We have discussed that standard of  care therapy for diagnosis of hyperplasia or malignancy of the uterus is surgery.  She has significant cardiac disease and given her frailty, she is not an ideal candidate for major surgery.  We have also discussed the option of minor surgery, in the form of a D&C and Mirena IUD placement.  The goal of such as surgery would be to confirm a diagnosis, decrease the risk of significant bleeding in the near future, and potentially offer treatment if cancer identified is one that we expect to be sensitive to hormonal therapy.  I reviewed that there are different types of endometrial cancer; one that is low-grade and endometrioid type would be much more likely to have a response to hormonal therapy.  An outpatient procedure would allow [place] to continue the patient on her anticoagulation or to stop it for a very brief period of time whereas hysterectomy would require stopping her anticoagulation for 2 days before and 2 days after surgery.  If the patient were to undergo hysterectomy, I reviewed the data that supports that there is no cancer survival benefit in lymph node assessment or removal.  The benefit comes from helping to determine stage as well as adjuvant treatment.  In someone who was not interested in pursuing adjuvant treatment options, at the patient underwent hysterectomy, I think performing total hysterectomy and BSO would help limit surgical risk, anesthesia time and be more appropriate given her desire not to receive chemotherapy or radiation.  After a long discussion and answering their questions, the patient would like to move forward with D&C and Mirena IUD insertion.  We have already reached out to her cardiologist, but will let them know that we plan to move forward with minor outpatient surgery.  This is a surgery that I am happy to do without disruption to her anticoagulation.  We will plan for D&C, possible hysteroscopy, Mirena IUD insertion in several weeks.  We discussed risks of surgery which  include but are not limited to bleeding, need for blood transfusion, infection, damage to surrounding tissues requiring repair, anesthesia risk (such as stroke, heart attack, VTE, and rarely death).  Perioperative instructions were reviewed with the patient and her daughter.  45 minutes of total time was spent for this patient encounter, including preparation, face-to-face counseling with the patient and coordination of care, and documentation of the encounter.  Jeral Pinch, MD  Division of Gynecologic Oncology  Department of Obstetrics and Gynecology  [facility]   

## 2021-05-12 NOTE — Patient Instructions (Signed)
Preparing for your Surgery   Plan for surgery on May 27, 2021 with Dr. Jeral Pinch at [facility] will be scheduled for a dilation and curettage of the uterus, Mirena intrauterine device placement, possible hysteroscopy with Myosure (using a camera to obtain sample from lining of the uterus).   Pre-operative Testing -You will receive a phone call from presurgical testing at [facility] to discuss surgery instructions and arrange for lab work if needed.   -Bring your insurance card, copy of an advanced directive if applicable, medication list.   -We will reach out to your cardiologist to see if Eliquis can be held or whether you can continue taking it before the procedure. We will let you know prior to surgery.   -Do not take supplements such as fish oil (omega 3), red yeast rice, turmeric before your surgery. You want to avoid medications with aspirin in them including headache powders such as BC or Goody's), Excedrin migraine.   Day Before Surgery at [facility] will be advised you can have clear liquids up until 3 hours before your surgery.     Your role in recovery Your role is to become active as soon as directed by your doctor, while still giving yourself time to heal.  Rest when you feel tired. You will be asked to do the following in order to speed your recovery:   - Cough and breathe deeply. This helps to clear and expand your lungs and can prevent pneumonia after surgery. - [place]. Do mild physical activity. Walking or moving your legs help your circulation and body functions return to normal. Do not try to get up or walk alone the first time after surgery.   -If you develop swelling on one leg or the other, pain in the back of your leg, redness/warmth in one of your legs, please call the office or go to the Emergency Room to have a doppler to rule out a blood clot. For shortness of breath, chest pain-seek care in the Emergency Room as  soon as possible. - Actively manage your pain. Managing your pain lets you move in comfort. We will ask you to rate your pain on a scale of zero to 10. It is your responsibility to tell your doctor or nurse where and how much you hurt so your pain can be treated.   Special Considerations -Your final pathology results from surgery should be available around one week after surgery and the results will be relayed to you when available.   -FMLA forms can be faxed to [phone] and please allow 5-7 business days for completion.   Pain Management After Surgery -Make sure that you have Tylenol to use on a regular basis after surgery for pain control.   Bowel Regimen -It is important to prevent constipation and drink adequate amounts of liquids.   Risks of Surgery Risks of surgery are low but include bleeding, infection, damage to surrounding structures, re-operation, blood clots, and very rarely death.   AFTER SURGERY INSTRUCTIONS   Return to work:  1-2 days if applicable   Activity: 1. Be up and out of the bed during the day.  Take a nap if needed.  You may walk up steps but be careful and use the hand rail.  Stair climbing will tire you more than you think, you may need to stop part way and rest.   2. No lifting or straining for 1 week over 10 pounds. No  pushing, pulling, straining for 1 week.   3. No driving for minimum 24 hours after surgery.  Do not drive if you are taking narcotic pain medicine and make sure that your reaction time has returned.   4. You can shower as soon as the next day after surgery. Shower daily. No tub baths or submerging your body in water until cleared by your surgeon.   5. No sexual activity and nothing in the vagina for 4 weeks.   6. You may experience vaginal spotting and discharge after surgery.  The spotting is normal but if you experience heavy bleeding, call our office.   7. Take Tylenol for pain. Monitor your Tylenol intake to a max of 4,000 mg in a  24 hour period.   Diet: 1. Low sodium Heart Healthy Diet is recommended but you are cleared to resume your normal (before surgery) diet after your procedure.   2. It is safe to use a laxative, such as Miralax or Colace, if you have difficulty moving your bowels.   Wound Care: 1. Keep clean and dry.  Shower daily.   Reasons to call the Doctor: Fever - Oral temperature greater than 100.4 degrees Fahrenheit Foul-smelling vaginal discharge Difficulty urinating Nausea and vomiting Difficulty breathing with or without chest pain New calf pain especially if only on one side Sudden, continuing increased vaginal bleeding with or without clots.   Contacts: For questions or concerns you should contact:   Dr. Jeral Pinch at [phone]   Joylene John, NP at [phone]   After Hours: call [phone] and have the GYN Oncologist paged/contacted (after 5 pm or on the weekends).   Messages sent via mychart are for non-urgent matters and are not responded to after hours so for urgent needs, please call the after hours number.

## 2021-05-13 NOTE — Telephone Encounter (Signed)
   Primary Cardiologist: Fransico Him, MD  Chart reviewed as part of pre-operative protocol coverage. Given past medical history and time since last visit, based on ACC/AHA guidelines, Molly [name] would be at acceptable risk for the planned procedure without further cardiovascular testing.   Patient with diagnosis of afib on Eliquis for anticoagulation.     Procedure: D&C; SURGICAL STRATIFICATION/OPTIMIZATION Date of procedure: TBD   CHA2DS2-VASc Score = 8  This indicates a 10.8% annual risk of stroke. The patient's score is based upon: CHF History: Yes HTN History: Yes Diabetes History: No Stroke History: Yes Vascular Disease History: Yes Age Score: 2 Gender Score: 1  CAD not on problem list but aortic atherosclerosis noted on abdominal CT, problem list has been updated.   CrCl 56mL/min using adjusted body weight due to obesity Platelet count 193K   Pt is at elevated cardiovascular risk off of her Eliquis given elevated CHADS2VASc score and recent TIA 2 months ago. Recommend limiting time off of anticoagulation as much as possible with 24 hour Eliquis hold. If > 1 day hold is required, will need cardiologist clearance.  Patient was advised that if she develops new symptoms prior to surgery to contact our office to arrange a follow-up appointment.  She verbalized understanding.  I will route this recommendation to the requesting party via Epic fax function and remove from pre-op pool.  Please call with questions.  Molly Ng. Payton Moder NP-C    05/13/2021, 9:35 AM [place] [address] Office [phone] Fax [phone]

## 2021-05-16 ENCOUNTER — Encounter: Payer: Self-pay | Admitting: Internal Medicine

## 2021-05-16 NOTE — Progress Notes (Signed)
[place] DEVICE PROGRAMMING  Patient Information: Name:  Molly [name]  DOB:  Mar 18, 1933  MRN:  [id number]    Willeen Cass, RN  P Cv Div Heartcare Device Planned Procedure:  D&C of uterus and IUD insertion. Possible hysteroscopy.  Surgeon:  Dr. Berline Lopes  Date of Procedure:  05/27/21  Cautery will be used.  Position during surgery:  unknown   Please send documentation back to:  [facility] (Fax # [phone])   Willeen Cass, RN  05/12/2021 10:13 AM     Device Information:  Clinic EP Physician:  [name], MD   Device Type:  Pacemaker Manufacturer and Phone #:  [employer]: [phone] Pacemaker Dependent?:  Yes.   Date of Last Device Check:  03/28/21 Normal Device Function?:  Yes.    Electrophysiologist's Recommendations:  Have magnet available. Provide continuous ECG monitoring when magnet is used or reprogramming is to be performed.  Procedure may interfere with device function.  Magnet should be placed over device during procedure.  Per Device Clinic Standing Orders, York Ram, RN  4:47 PM 05/16/2021

## 2021-05-16 NOTE — Progress Notes (Addendum)
COVID Vaccine Completed: yes x3 Date COVID Vaccine completed: Has received booster: COVID vaccine manufacturer: Moderna    Date of COVID positive in last 90 days: no  PCP - Harlan Stains, MD Cardiologist - Fransico Him, MD  Cardiac Clearance note dated 05/13/21 by Coletta Memos.  Chest x-ray -  EKG - 05/04/21 Epic Stress Test - 12/31/15 Epic ECHO - 01/07/21 Epic Cardiac Cath - 05/29/19 Epic Pacemaker/ICD device last checked: 03/28/21 Epic Spinal Cord Stimulator: N/A  Sleep Study - N/A CPAP -   Fasting Blood Sugar - N/A Checks Blood Sugar _____ times a day  Blood Thinner Instructions: Eliquis, hold 1 day before surgery Aspirin Instructions: Last Dose: 05/26/21  Activity level: Can perform activities of daily living without stopping and without symptoms of chest pain or shortness of breath. Has difficulty getting up and down stairs due to  arthritis.       Anesthesia review: SOB, HTN, A fib, complete heart block, HF  Patient denies shortness of breath, fever, cough and chest pain at PAT appointment   Patient verbalized understanding of instructions that were given to them at the PAT appointment. Patient was also instructed that they will need to review over the PAT instructions again at home before surgery.

## 2021-05-19 ENCOUNTER — Encounter (HOSPITAL_COMMUNITY): Payer: Self-pay

## 2021-05-19 ENCOUNTER — Other Ambulatory Visit: Payer: Self-pay

## 2021-05-19 ENCOUNTER — Encounter (HOSPITAL_COMMUNITY)
Admission: RE | Admit: 2021-05-19 | Discharge: 2021-05-19 | Disposition: A | Discharge status: Home / Self Care | Payer: Medicare HMO | Source: Ambulatory Visit | Attending: Gynecologic Oncology | Admitting: Gynecologic Oncology

## 2021-05-19 DIAGNOSIS — Z01812 Encounter for preprocedural laboratory examination: Secondary | ICD-10-CM | POA: Diagnosis not present

## 2021-05-19 HISTORY — DX: Presence of cardiac pacemaker: Z95.0

## 2021-05-19 HISTORY — DX: Transient cerebral ischemic attack, unspecified: G45.9

## 2021-05-19 HISTORY — DX: Unspecified osteoarthritis, unspecified site: M19.90

## 2021-05-19 NOTE — Patient Instructions (Addendum)
DUE TO COVID-19 ONLY ONE VISITOR IS ALLOWED TO COME WITH YOU AND STAY IN THE WAITING ROOM ONLY DURING PRE OP AND PROCEDURE.   **NO VISITORS ARE ALLOWED IN THE SHORT STAY AREA OR RECOVERY ROOM!!**       Your procedure is scheduled on: 05/27/21   Report to [facility] Main  Entrance   Report to Short Stay at 5:15 AM   [facility])    Call this number if you have problems the morning of surgery [phone]   Do not eat food :After Midnight.   May have liquids until  4:30 AM day of surgery  CLEAR LIQUID DIET  Foods Allowed                                                                     Foods Excluded  Water, Black Coffee and tea, regular and decaf                liquids that you cannot  Plain Jell-O in any flavor  (No red)                                      see through such as: Fruit ices (not with fruit pulp)                                              milk, soups, orange juice              Iced Popsicles (No red)                                                  All solid food                                   Apple juices Sports drinks like Gatorade (No red) Lightly seasoned clear broth or consume(fat free) Sugar, honey syrup   Oral Hygiene is also important to reduce your risk of infection.                                    Remember - BRUSH YOUR TEETH THE MORNING OF SURGERY WITH YOUR REGULAR TOOTHPASTE   Take these medicines the morning of surgery with A SIP OF WATER: Acetaminophen, Levocetirizine, Levothyroxine, Lorazepam, Omeprazole, Rosuvastatin.                               You may not have any metal on your body including hair pins, jewelry, and body piercing             Do not wear make-up, lotions, powders, perfumes, or deodorant  Do not wear nail polish including gel and S&S, artificial/acrylic nails, or  any other type of covering on natural nails including finger and toenails. If you have artificial nails, gel coating, etc. that needs to be removed by  a nail salon please have this removed prior to surgery or surgery may need to be canceled/ delayed if the surgeon/ anesthesia feels like they are unable to be safely monitored.   Do not shave  48 hours prior to surgery.              Do not bring valuables to the hospital. [place].   Contacts, dentures or bridgework may not be worn into surgery.   Patients discharged the day of surgery will not be allowed to drive home.   Please read over the following fact sheets you were given: IF YOU HAVE QUESTIONS ABOUT YOUR PRE OP INSTRUCTIONS PLEASE CALL [phone]- [place] - Preparing for Surgery Before surgery, you can play an important role.  Because skin is not sterile, your skin needs to be as free of germs as possible.  You can reduce the number of germs on your skin by washing with CHG (chlorahexidine gluconate) soap before surgery.  CHG is an antiseptic cleaner which kills germs and bonds with the skin to continue killing germs even after washing. Please DO NOT use if you have an allergy to CHG or antibacterial soaps.  If your skin becomes reddened/irritated stop using the CHG and inform your nurse when you arrive at Short Stay. Do not shave (including legs and underarms) for at least 48 hours prior to the first CHG shower.  You may shave your face/neck.  Please follow these instructions carefully:  1.  Shower with CHG Soap the night before surgery and the  morning of surgery.  2.  If you choose to wash your hair, wash your hair first as usual with your normal  shampoo.  3.  After you shampoo, rinse your hair and body thoroughly to remove the shampoo.                             4.  Use CHG as you would any other liquid soap.  You can apply chg directly to the skin and wash.  Gently with a scrungie or clean washcloth.  5.  Apply the CHG Soap to your body ONLY FROM THE NECK DOWN.   Do   not use on face/ open                            Wound or open sores. Avoid contact with eyes, ears mouth and   genitals (private parts).                       Wash face,  Genitals (private parts) with your normal soap.             6.  Wash thoroughly, paying special attention to the area where your    surgery  will be performed.  7.  Thoroughly rinse your body with warm water from the neck down.  8.  DO NOT shower/wash with your normal soap after using and rinsing off the CHG Soap.                9.  Pat yourself dry with a clean towel.  10.  Wear clean pajamas.            11.  Place clean sheets on your bed the night of your first shower and do not  sleep with pets. Day of Surgery : Do not apply any lotions/deodorants the morning of surgery.  Please wear clean clothes to the hospital/surgery center.  FAILURE TO FOLLOW THESE INSTRUCTIONS MAY RESULT IN THE CANCELLATION OF YOUR SURGERY  PATIENT SIGNATURE_________________________________  NURSE SIGNATURE__________________________________  ________________________________________________________________________

## 2021-05-20 ENCOUNTER — Telehealth: Payer: Self-pay

## 2021-05-20 NOTE — Telephone Encounter (Signed)
Spoke with Molly [name] and informed her that her surgery has been moved from Tuesday 8/2, to Monday 8/1 in the afternoon. Patient is ok with this change. She has been instructed to hold Eliquis 1 day before surgery. Her last dose will be Saturday evening. Patient verbalizes understanding. Instructed patient that I will call her on Friday to check in with her. She does have our office number and will call with any questions or concerns.

## 2021-05-21 NOTE — Anesthesia Preprocedure Evaluation (Addendum)
Anesthesia Evaluation  Patient identified by MRN, date of birth, ID band Patient awake    Reviewed: Allergy & Precautions, NPO status , Patient's Chart, lab work & pertinent test results  History of Anesthesia Complications (+) PONV and history of anesthetic complications  Airway Mallampati: II  TM Distance: >3 FB Neck ROM: Full    Dental  (+) Dental Advisory Given   Pulmonary neg pulmonary ROS, neg shortness of breath, neg sleep apnea, neg COPD, neg recent URI,  Covid-19 Nucleic Acid Test Results Lab Results      Component                Value               Date                      [place]              NEGATIVE            05/04/2021                [place]              NEGATIVE            03/10/2021                [place]              NOT DETECTED        11/06/2019                [place]              NOT DETECTED        09/23/2019                [place]              NEGATIVE            05/25/2019              breath sounds clear to auscultation       Cardiovascular hypertension, +CHF  + dysrhythmias Atrial Fibrillation + pacemaker  Rhythm:Regular  2017 stress:  ? There was no ST segment deviation noted during stress. ? The study is normal. There is no ischemia. ? This is a low risk study. ? The study could not be gated. No LV function information is available . The LV cavity size is normal   [employer] VVIR 70bpm   Neuro/Psych PSYCHIATRIC DISORDERS Anxiety TIA   GI/Hepatic GERD  ,  Endo/Other  Hypothyroidism   Renal/GU Renal diseaseLab Results      Component                Value               Date                      CREATININE               0.98                05/04/2021                Musculoskeletal  (+) Arthritis ,   Abdominal   Peds  Hematology  (+) Blood dyscrasia, , Lab Results      Component                Value  Date                      WBC                       7.0                 05/26/2021                HGB                      12.0                05/26/2021                HCT                      38.6                05/26/2021                MCV                      105.2 (H)           05/26/2021                PLT                      178                 05/26/2021              Anesthesia Other Findings Per cardiology preoperative evaluation 05/13/2021, "Chart reviewed as part of pre-operative protocol coverage. Given past medical history and time since last visit, based on ACC/AHA guidelines,Mikel A Bridgeswould be at acceptable risk for the planned procedure without further cardiovascular testing.   Reproductive/Obstetrics                           Anesthesia Physical Anesthesia Plan  ASA: 3  Anesthesia Plan: General   Post-op Pain Management:    Induction: Intravenous  PONV Risk Score and Plan: 4 or greater and Ondansetron, Dexamethasone, Treatment may vary due to age or medical condition, TIVA and Propofol infusion  Airway Management Planned: LMA  Additional Equipment: None  Intra-op Plan:   Post-operative Plan:   Informed Consent: I have reviewed the patients History and Physical, chart, labs and discussed the procedure including the risks, benefits and alternatives for the proposed anesthesia with the patient or authorized representative who has indicated his/her understanding and acceptance.     Dental advisory given  Plan Discussed with: CRNA and Anesthesiologist  Anesthesia Plan Comments: (See PAT note 05/19/2021, Konrad Felix, PA-C)      Anesthesia Quick Evaluation

## 2021-05-21 NOTE — Progress Notes (Signed)
Anesthesia Chart Review   Case: [id number] Date/Time: 05/26/21 1445   Procedures:      DILATATION AND CURETTAGE OF THE UTERUS     INTRAUTERINE DEVICE (IUD) INSERTION MIRENA     POSSIBLE HYSTEROSCOPY WITH MYOSURE   Anesthesia type: Choice   Pre-op diagnosis: SUSPECTED UTERINE CANCER   Location: [facility]OM 08 / [facility]   Surgeons: Lafonda Mosses, MD       DISCUSSION:88 y.o. never smoker with h/o PONV, HTN, GERD, PVCs, atrial fibrillation (Eliquis), CKD Stage III, CHF, tachy-brady syndrome s/p PPM implant 2014 (device orders in 05/16/21 progress note), TIA suspected uterine cancer scheduled for above procedure 05/26/21 with Dr. Jeral Pinch.   Per cardiology preoperative evaluation 05/13/2021, "Chart reviewed as part of pre-operative protocol coverage. Given past medical history and time since last visit, based on ACC/AHA guidelines, DYMIN PILGER would be at acceptable risk for the planned procedure without further cardiovascular testing.    Patient with diagnosis of afib on Eliquis for anticoagulation.     Procedure: D&C; SURGICAL STRATIFICATION/OPTIMIZATION Date of procedure: TBD   CHA2DS2-VASc Score = 8  This indicates a 10.8% annual risk of stroke. The patient's score is based upon: CHF History: Yes HTN History: Yes Diabetes History: No Stroke History: Yes Vascular Disease History: Yes Age Score: 2 Gender Score: 1  CAD not on problem list but aortic atherosclerosis noted on abdominal CT, problem list has been updated.   CrCl 60m/min using adjusted body weight due to obesity Platelet count 193K   Pt is at elevated cardiovascular risk off of her Eliquis given elevated CHADS2VASc score and recent TIA 2 months ago. Recommend limiting time off of anticoagulation as much as possible with 24 hour Eliquis hold. If > 1 day hold is required, will need cardiologist clearance."   VS: BP (!) 151/92   Pulse 70   Temp 36.6 C (Oral)   Resp 16   Ht '5\' 4"'$  (1.626 m)   SpO2 98%    BMI 30.86 kg/m   PROVIDERS: WHarlan Stains MD is PCP   TFransico Him MD is Cardiologist  LABS: Labs reviewed: Acceptable for surgery. (all labs ordered are listed, but only abnormal results are displayed)  Labs Reviewed  TYPE AND SCREEN     IMAGES:   EKG: 05/04/2021 Rate 70 bpm  Ventricular paced rhythm    CV: Echo 01/07/2021  1. Left ventricular ejection fraction, by estimation, is 40 to 45%. The  left ventricle has mildly decreased function. The left ventricle  demonstrates global hypokinesis. There is mild concentric left ventricular  hypertrophy. Left ventricular diastolic  parameters are indeterminate.   2. Right ventricular systolic function is normal. The right ventricular  size is normal.   3. Left atrial size was severely dilated.   4. The mitral valve is normal in structure. Trivial mitral valve  regurgitation. No evidence of mitral stenosis.   5. The aortic valve is normal in structure. There is mild calcification  of the aortic valve. There is mild thickening of the aortic valve. Aortic  valve regurgitation is trivial. No aortic stenosis is present.   6. The inferior vena cava is normal in size with greater than 50%  respiratory variability, suggesting right atrial pressure of 3 mmHg.  Stress Test 12/31/2015 There was no ST segment deviation noted during stress. The study is normal. There is no ischemia. This is a low risk study. The study could not be gated. No LV function information is available . The LV cavity  size is normal  Past Medical History:  Diagnosis Date   Aortic insufficiency    Arthritis    Atrial tachycardia (HCC)    Cancer (HCC)    Skin cancer- basal.  1 mylenoma   Chronic combined systolic and diastolic CHF (congestive heart failure) (HCC)    CKD (chronic kidney disease), stage III (HCC)    Complication of anesthesia    DCM (dilated cardiomyopathy) ([place])    EF 40-45% by echo 2017 - nuclear stress test with no ischemia   GERD  (gastroesophageal reflux disease)    H/O benign essential tremor    on amiodarone resolved off amio   H/O cardiac radiofrequency ablation    a. AV node ablation in 05/2019.   H/O hyperkalemia    Resolved off ACE inhibitors   High cholesterol    History of blood transfusion    Hyperkalemia    Hyperparathyroidism ([place])    Hypertension    Mitral regurgitation    trivial by echo 12/2020   Persistent atrial fibrillation (HCC)    PONV (postoperative nausea and vomiting)    Presence of permanent cardiac pacemaker    PVC's (premature ventricular contractions)    Tachycardia-bradycardia syndrome ([place])    s/p PPM 02/2013   TIA (transient ischemic attack)     Past Surgical History:  Procedure Laterality Date   AMPUTATION Right 11/27/2014   Procedure: RIGHT 2ND AND 3RD TOE AMPUTATION;  Surgeon: Wylene Simmer, MD;  Location: [facility];  Service: Orthopedics;  Laterality: Right;   APPENDECTOMY     performed at the time of one of her c-sections   ATRIAL FIBRILLATION ABLATION N/A 05/29/2019   Procedure: AVN ABLATION;  Surgeon: Evans Lance, MD;  Location: [place] [facility];  Service: Cardiovascular;  Laterality: N/A;   BIV UPGRADE N/A 09/27/2019   Procedure: BIV UPGRADE;  Surgeon: Evans Lance, MD;  Location: [place] [facility];  Service: Cardiovascular;  Laterality: N/A;   BUNIONECTOMY     Right   CARDIAC CATHETERIZATION  09/25/2000   EF of 50% -- with normal left ventricular size and function   CARDIOVERSION N/A 12/28/2017   Procedure: CARDIOVERSION;  Surgeon: Dorothy Spark, MD;  Location: [facility] ENDOSCOPY;  Service: Cardiovascular;  Laterality: N/A;   CARDIOVERSION N/A 02/25/2018   Procedure: CARDIOVERSION;  Surgeon: Skeet Latch, MD;  Location: [place];  Service: Cardiovascular;  Laterality: N/A;   [place] Bilateral    Cataract   FOOT SURGERY     INSERT / REPLACE / REMOVE PACEMAKER  02/23/2013   PACEMAKER INSERTION  02/23/2013    PERMANENT PACEMAKER INSERTION N/A 03/02/2013   Procedure: PERMANENT PACEMAKER INSERTION;  Surgeon: Evans Lance, MD;  Location[facility] C[facility];  Service: Cardiovascular;  Laterality: N/A;   SHOULDER ARTHROSCOPY W/ ROTATOR CUFF REPAIR     SHOULDER SURGERY Right    roto cuff   TOE AMPUTATION Left    2nd and 3rd, bunion under toes.   TONSILLECTOMY     VARICOSE VEIN SURGERY      MEDICATIONS:  acetaminophen (TYLENOL) 500 MG tablet   apixaban (ELIQUIS) 5 MG TABS tablet   dorzolamide-timolol (COSOPT) 22.3-6.8 MG/ML ophthalmic solution   furosemide (LASIX) 20 MG tablet   latanoprost (XALATAN) 0.005 % ophthalmic solution   levocetirizine (XYZAL) 5 MG tablet   levothyroxine (SYNTHROID) 50 MCG tablet   LORazepam (ATIVAN) 0.5 MG tablet   omeprazole (PRILOSEC) 20 MG capsule  rosuvastatin (CRESTOR) 5 MG tablet   No current facility-administered medications for this encounter.     Konrad Felix, PA-C [facility] [phone]

## 2021-05-23 ENCOUNTER — Telehealth: Payer: Self-pay

## 2021-05-23 NOTE — Telephone Encounter (Signed)
Telephone call to check on pre-operative status.  Patient compliant with pre-operative instructions.  Reinforced NPO after midnight.  No questions or concerns voiced. Since surgical date changed confirmed with patient that her surgery is Monday 8/1 and she is to arrive at 1pm. Instructed to call for any needs.

## 2021-05-26 ENCOUNTER — Encounter (HOSPITAL_COMMUNITY)
Admission: RE | Disposition: A | Discharge status: Home / Self Care | Payer: Self-pay | Source: Home / Self Care | Attending: Gynecologic Oncology

## 2021-05-26 ENCOUNTER — Telehealth: Payer: Self-pay | Admitting: Cardiology

## 2021-05-26 ENCOUNTER — Ambulatory Visit (HOSPITAL_COMMUNITY): Payer: Medicare HMO | Admitting: Physician Assistant

## 2021-05-26 ENCOUNTER — Ambulatory Visit (HOSPITAL_COMMUNITY): Payer: Medicare HMO | Admitting: Certified Registered"

## 2021-05-26 ENCOUNTER — Ambulatory Visit (HOSPITAL_COMMUNITY)
Admission: RE | Admit: 2021-05-26 | Discharge: 2021-05-26 | Disposition: A | Discharge status: Home / Self Care | Payer: Medicare HMO | Attending: Gynecologic Oncology | Admitting: Gynecologic Oncology

## 2021-05-26 ENCOUNTER — Encounter (HOSPITAL_COMMUNITY): Payer: Self-pay | Admitting: Gynecologic Oncology

## 2021-05-26 DIAGNOSIS — R9389 Abnormal findings on diagnostic imaging of other specified body structures: Secondary | ICD-10-CM

## 2021-05-26 DIAGNOSIS — Z85828 Personal history of other malignant neoplasm of skin: Secondary | ICD-10-CM | POA: Insufficient documentation

## 2021-05-26 DIAGNOSIS — N858 Other specified noninflammatory disorders of uterus: Secondary | ICD-10-CM

## 2021-05-26 DIAGNOSIS — N840 Polyp of corpus uteri: Secondary | ICD-10-CM | POA: Diagnosis not present

## 2021-05-26 DIAGNOSIS — Z950 Presence of cardiac pacemaker: Secondary | ICD-10-CM | POA: Diagnosis not present

## 2021-05-26 DIAGNOSIS — Z89421 Acquired absence of other right toe(s): Secondary | ICD-10-CM | POA: Insufficient documentation

## 2021-05-26 DIAGNOSIS — N950 Postmenopausal bleeding: Secondary | ICD-10-CM | POA: Insufficient documentation

## 2021-05-26 DIAGNOSIS — Z79890 Hormone replacement therapy (postmenopausal): Secondary | ICD-10-CM | POA: Diagnosis not present

## 2021-05-26 DIAGNOSIS — C541 Malignant neoplasm of endometrium: Secondary | ICD-10-CM | POA: Diagnosis not present

## 2021-05-26 DIAGNOSIS — Z7901 Long term (current) use of anticoagulants: Secondary | ICD-10-CM | POA: Diagnosis not present

## 2021-05-26 DIAGNOSIS — I4819 Other persistent atrial fibrillation: Secondary | ICD-10-CM | POA: Diagnosis not present

## 2021-05-26 DIAGNOSIS — I959 Hypotension, unspecified: Secondary | ICD-10-CM | POA: Diagnosis not present

## 2021-05-26 DIAGNOSIS — Z79899 Other long term (current) drug therapy: Secondary | ICD-10-CM | POA: Diagnosis not present

## 2021-05-26 HISTORY — PX: DILATATION & CURETTAGE/HYSTEROSCOPY WITH MYOSURE: SHX6511

## 2021-05-26 HISTORY — PX: DILATION AND CURETTAGE OF UTERUS: SHX78

## 2021-05-26 LAB — CBC
HCT: 38.6 % (ref 36.0–46.0)
Hemoglobin: 12 g/dL (ref 12.0–15.0)
MCH: 32.7 pg (ref 26.0–34.0)
MCHC: 31.1 g/dL (ref 30.0–36.0)
MCV: 105.2 fL — ABNORMAL HIGH (ref 80.0–100.0)
Platelets: 178 10*3/uL (ref 150–400)
RBC: 3.67 MIL/uL — ABNORMAL LOW (ref 3.87–5.11)
RDW: 14.3 % (ref 11.5–15.5)
WBC: 7 10*3/uL (ref 4.0–10.5)
nRBC: 0 % (ref 0.0–0.2)

## 2021-05-26 LAB — BASIC METABOLIC PANEL
Anion gap: 7 (ref 5–15)
BUN: 24 mg/dL — ABNORMAL HIGH (ref 8–23)
CO2: 24 mmol/L (ref 22–32)
Calcium: 10.5 mg/dL — ABNORMAL HIGH (ref 8.9–10.3)
Chloride: 111 mmol/L (ref 98–111)
Creatinine, Ser: 1.05 mg/dL — ABNORMAL HIGH (ref 0.44–1.00)
GFR, Estimated: 52 mL/min — ABNORMAL LOW (ref >60)
Glucose, Bld: 103 mg/dL — ABNORMAL HIGH (ref 70–99)
Potassium: 4.1 mmol/L (ref 3.5–5.1)
Sodium: 142 mmol/L (ref 135–145)

## 2021-05-26 LAB — TYPE AND SCREEN
ABO/RH(D): A POS
Antibody Screen: NEGATIVE

## 2021-05-26 SURGERY — DILATION AND CURETTAGE
Anesthesia: General | Site: Uterus

## 2021-05-26 MED ORDER — ORAL CARE MOUTH RINSE: 15.0000 mL | Freq: Once | OROMUCOSAL | Status: AC

## 2021-05-26 MED ORDER — SILVER NITRATE-POT NITRATE 75-25 % EX MISC
CUTANEOUS | Status: AC
  Filled 2021-05-26: qty 10

## 2021-05-26 MED ORDER — PHENYLEPHRINE 40 MCG/ML (10ML) SYRINGE FOR IV PUSH (FOR BLOOD PRESSURE SUPPORT)
PREFILLED_SYRINGE | INTRAVENOUS | Status: AC
  Filled 2021-05-26: qty 10

## 2021-05-26 MED ORDER — ACETAMINOPHEN 500 MG PO TABS
1000.0000 mg | ORAL_TABLET | ORAL | Status: DC
  Filled 2021-05-26: qty 2

## 2021-05-26 MED ORDER — PROPOFOL 10 MG/ML IV BOLUS
INTRAVENOUS | Status: AC
  Filled 2021-05-26: qty 20

## 2021-05-26 MED ORDER — ONDANSETRON HCL 4 MG/2ML IJ SOLN
INTRAMUSCULAR | Status: DC | PRN
  Administered 2021-05-26: 4 mg via INTRAVENOUS

## 2021-05-26 MED ORDER — LEVONORGESTREL 20 MCG/DAY IU IUD
1.0000 | INTRAUTERINE_SYSTEM | INTRAUTERINE | Status: DC
Start: 2021-05-26 — End: 2021-05-26
  Filled 2021-05-26: qty 1

## 2021-05-26 MED ORDER — LIDOCAINE HCL (PF) 1 % IJ SOLN
INTRAMUSCULAR | Status: AC
  Filled 2021-05-26: qty 60

## 2021-05-26 MED ORDER — FENTANYL CITRATE (PF) 100 MCG/2ML IJ SOLN
INTRAMUSCULAR | Status: DC | PRN
  Administered 2021-05-26 (×2): 50 ug via INTRAVENOUS

## 2021-05-26 MED ORDER — HEPARIN SODIUM (PORCINE) 5000 UNIT/ML IJ SOLN
5000.0000 [IU] | INTRAMUSCULAR | Status: AC
  Administered 2021-05-26: 5000 [IU] via SUBCUTANEOUS
  Filled 2021-05-26: qty 1

## 2021-05-26 MED ORDER — OXYCODONE HCL 5 MG/5ML PO SOLN
5.0000 mg | Freq: Once | ORAL | Status: DC | PRN
Start: 2021-05-26 — End: 2021-05-26

## 2021-05-26 MED ORDER — SODIUM CHLORIDE 0.9 % IR SOLN
Status: DC | PRN
  Administered 2021-05-26: 3000 mL

## 2021-05-26 MED ORDER — PROPOFOL 10 MG/ML IV BOLUS
INTRAVENOUS | Status: DC | PRN
  Administered 2021-05-26: 140 mg via INTRAVENOUS

## 2021-05-26 MED ORDER — FENTANYL CITRATE (PF) 100 MCG/2ML IJ SOLN
INTRAMUSCULAR | Status: AC
  Filled 2021-05-26: qty 2

## 2021-05-26 MED ORDER — LACTATED RINGERS IV SOLN: INTRAVENOUS | Status: DC

## 2021-05-26 MED ORDER — LIDOCAINE HCL (CARDIAC) PF 100 MG/5ML IV SOSY
PREFILLED_SYRINGE | INTRAVENOUS | Status: DC | PRN
  Administered 2021-05-26: 80 mg via INTRAVENOUS

## 2021-05-26 MED ORDER — 0.9 % SODIUM CHLORIDE (POUR BTL) OPTIME
TOPICAL | Status: DC | PRN
  Administered 2021-05-26: 1000 mL

## 2021-05-26 MED ORDER — DEXAMETHASONE SODIUM PHOSPHATE 10 MG/ML IJ SOLN
INTRAMUSCULAR | Status: DC | PRN
  Administered 2021-05-26: 5 mg via INTRAVENOUS

## 2021-05-26 MED ORDER — OXYCODONE HCL 5 MG PO TABS: 5.0000 mg | ORAL_TABLET | Freq: Once | ORAL | Status: DC | PRN

## 2021-05-26 MED ORDER — LIDOCAINE HCL 1 % IJ SOLN
INTRAMUSCULAR | Status: DC | PRN
  Administered 2021-05-26: 10 mL

## 2021-05-26 MED ORDER — FENTANYL CITRATE (PF) 100 MCG/2ML IJ SOLN: 25.0000 ug | INTRAMUSCULAR | Status: DC | PRN

## 2021-05-26 MED ORDER — ACETAMINOPHEN 500 MG PO TABS: 1000.0000 mg | ORAL_TABLET | Freq: Once | ORAL | Status: DC | PRN

## 2021-05-26 MED ORDER — PROPOFOL 500 MG/50ML IV EMUL
INTRAVENOUS | Status: DC | PRN
  Administered 2021-05-26: 125 ug/kg/min via INTRAVENOUS

## 2021-05-26 MED ORDER — CHLORHEXIDINE GLUCONATE 0.12 % MT SOLN
15.0000 mL | Freq: Once | OROMUCOSAL | Status: AC
  Administered 2021-05-26: 15 mL via OROMUCOSAL

## 2021-05-26 MED ORDER — ACETAMINOPHEN 160 MG/5ML PO SOLN: 1000.0000 mg | Freq: Once | ORAL | Status: DC | PRN

## 2021-05-26 MED ORDER — PROPOFOL 1000 MG/100ML IV EMUL
INTRAVENOUS | Status: AC
  Filled 2021-05-26: qty 100

## 2021-05-26 MED ORDER — ACETAMINOPHEN 10 MG/ML IV SOLN: 1000.0000 mg | Freq: Once | INTRAVENOUS | Status: DC | PRN

## 2021-05-26 MED ORDER — LIDOCAINE 2% (20 MG/ML) 5 ML SYRINGE
INTRAMUSCULAR | Status: AC
  Filled 2021-05-26: qty 5

## 2021-05-26 SURGICAL SUPPLY — 38 items
BACTOSHIELD CHG 4% 4OZ (MISCELLANEOUS)
BAG COUNTER SPONGE SURGICOUNT (BAG) ×3 IMPLANT
BIPOLAR CUTTING LOOP 21FR (ELECTRODE)
CANISTER SUCT 3000ML PPV (MISCELLANEOUS) ×3 IMPLANT
CATH ROBINSON RED A/P 16FR (CATHETERS) ×3 IMPLANT
DEVICE MYOSURE LITE (MISCELLANEOUS) IMPLANT
DEVICE MYOSURE REACH (MISCELLANEOUS) IMPLANT
DILATOR CANAL MILEX (MISCELLANEOUS) IMPLANT
DRAPE SHEET LG 3/4 BI-LAMINATE (DRAPES) ×3 IMPLANT
DRAPE UNDERBUTTOCKS STRL (DISPOSABLE) ×3 IMPLANT
DRSG TELFA 3X8 NADH (GAUZE/BANDAGES/DRESSINGS) ×3 IMPLANT
GAUZE 4X4 16PLY ~~LOC~~+RFID DBL (SPONGE) ×3 IMPLANT
GLOVE SURG ENC MOIS LTX SZ6 (GLOVE) ×6 IMPLANT
GLOVE SURG ENC MOIS LTX SZ6.5 (GLOVE) IMPLANT
GOWN STRL REUS W/ TWL LRG LVL3 (GOWN DISPOSABLE) ×4 IMPLANT
GOWN STRL REUS W/TWL LRG LVL3 (GOWN DISPOSABLE) ×9 IMPLANT
IV NS IRRIG 3000ML ARTHROMATIC (IV SOLUTION) ×3 IMPLANT
KIT BASIN OR (CUSTOM PROCEDURE TRAY) ×3 IMPLANT
KIT PROCEDURE FLUENT (KITS) ×3 IMPLANT
KIT TURNOVER KIT A (KITS) ×3 IMPLANT
LOOP CUTTING BIPOLAR 21FR (ELECTRODE) IMPLANT
MYOSURE XL FIBROID REM (MISCELLANEOUS)
NEEDLE SPNL 22GX3.5 QUINCKE BK (NEEDLE) ×3 IMPLANT
NS IRRIG 1000ML POUR BTL (IV SOLUTION) ×3 IMPLANT
PACK LITHOTOMY IV (CUSTOM PROCEDURE TRAY) ×3 IMPLANT
PACK VAGINAL MINOR WOMEN LF (CUSTOM PROCEDURE TRAY) ×3 IMPLANT
PAD OB MATERNITY 4.3X12.25 (PERSONAL CARE ITEMS) ×3 IMPLANT
PAD PREP 24X48 CUFFED NSTRL (MISCELLANEOUS) ×3 IMPLANT
PENCIL SMOKE EVACUATOR (MISCELLANEOUS) IMPLANT
SCRUB CHG 4% DYNA-HEX 4OZ (MISCELLANEOUS) IMPLANT
SEAL ROD LENS SCOPE MYOSURE (ABLATOR) ×3 IMPLANT
SURGILUBE 2OZ TUBE FLIPTOP (MISCELLANEOUS) ×3 IMPLANT
SYR BULB IRRIG 60ML STRL (SYRINGE) ×3 IMPLANT
SYSTEM TISS REMOVAL MYSR XL RM (MISCELLANEOUS) IMPLANT
TOWEL OR 17X26 10 PK STRL BLUE (TOWEL DISPOSABLE) ×3 IMPLANT
TOWEL OR NON WOVEN STRL DISP B (DISPOSABLE) ×3 IMPLANT
UNDERPAD 30X36 HEAVY ABSORB (UNDERPADS AND DIAPERS) ×6 IMPLANT
WATER STERILE IRR 500ML POUR (IV SOLUTION) ×3 IMPLANT

## 2021-05-26 NOTE — Transfer of Care (Signed)
Immediate Anesthesia Transfer of Care Note  Patient: Molly [name]  Procedure(s) Performed: DILATATION AND CURETTAGE OF THE UTERUS (Uterus) HYSTEROSCOPY (Uterus)  Patient Location: PACU  Anesthesia Type:General  Level of Consciousness: awake, drowsy and patient cooperative  Airway & Oxygen Therapy: Patient Spontanous Breathing and Patient connected to face mask oxygen  Post-op Assessment: Report given to RN and Post -op Vital signs reviewed and stable  Post vital signs: Reviewed and stable  Last Vitals:  Vitals Value Taken Time  BP 126/77 1600  Temp    Pulse 70 1600  Resp 18 1600  SpO2 100 1600    Last Pain:  Vitals:   05/26/21 1340  TempSrc:   PainSc: 0-No pain         Complications: No notable events documented.

## 2021-05-26 NOTE — Discharge Instructions (Signed)
05/26/2021  RESTART YOUR ELIQUIS TOMORROW MORNING (May 27, 2021 in the am). Do not take tonight.   Activity: 1. Be up and out of the bed during the day.  Take a nap if needed.  You may walk up steps but be careful and use the hand rail.  Stair climbing will tire you more than you think, you may need to stop part way and rest.   2. No lifting or straining over 10 lbs, pushing, pulling, straining for 2 weeks.  3. No driving for minimum of 24 hours after the procedure.  Do not drive if you are taking narcotic pain medicine. You need to make sure your reaction time has returned and you can brake safely.  4. Shower daily.  Use your regular soap to bathe and when finished pat your incision dry; don't rub.  No tub baths until cleared by your surgeon.   5. No sexual activity and nothing in the vagina for 4 weeks.  6. You may experience vaginal spotting after surgery. The spotting is normal but if you experience heavy bleeding, call our office.  8. Take Tylenol for pain. Monitor your Tylenol intake to a max of 4,000 mg.   Diet: 1. Low sodium Heart Healthy Diet is recommended.  2. It is safe to use a laxative, such as Miralax or Colace, if you have difficulty moving your bowels.  Wound Care: 1. Keep clean and dry.  Shower daily.  Reasons to call the Doctor: Fever - Oral temperature greater than 100.4 degrees Fahrenheit Foul-smelling vaginal discharge Difficulty urinating Nausea and vomiting Increased pain at the site of the incision that is unrelieved with pain medicine. Difficulty breathing with or without chest pain New calf pain especially if only on one side Sudden, continuing increased vaginal bleeding with or without clots.   Contacts: For questions or concerns you should contact:  Dr. Jeral Pinch at [phone]  Joylene John, NP at [phone]  After Hours: call [phone] and have the GYN Oncologist paged/contacted

## 2021-05-26 NOTE — Op Note (Signed)
PATIENT: Molly [name] DATE: 05/26/21  Preop Diagnosis: PMB, thickened endometrium, biopsy in clinic concerning for adenocarcinoma  Postoperative Diagnosis: same, high grade carcinoma on frozen section  Surgery: D&C, hysteroscopy  Surgeons:  Valarie Cones MD  Anesthesia: General   Estimated blood loss: <20 ml  IVF:  see I&O flowsheet   Urine output: n/a  Fluid balance: - [id number]   Complications: None apparent   Pathology: endometrial curetteings  Operative findings: On EUA, small mobile uterus, bulbous. On d&c, copious tissue and blood noted. On hysteroscopy, polypoid tissue noted along much of endometrium.  Given high grade histology on frozen, decision made not to proceed with Mirena IUD placement.  Procedure: The patient was identified in the preoperative holding area. Informed consent was signed on the chart. Patient was seen history was reviewed and exam was performed.   The patient was then taken to the operating room and placed in the supine position with SCD hose on. General anesthesia was then induced without difficulty. She was then placed in the dorsolithotomy position. The perineum was prepped with Betadine. The vagina was prepped with Betadine. The patient was then draped after the prep was dried.   Timeout was performed the patient, procedure, antibiotic, allergy, and length of procedure.   The speculum was placed in the vagina. The single tooth tenaculum was placed on the anterior lip of the cervix. The uterine sound was placed in the cervix and advanced to the fundus at 7.5cm. The cervix was successively dilated using pratts dilators to 61F.  Hysteroscopy was performed with findings noted above. A sharp curette was advanced to the fundus and a comprehensive curette of the endometrial cavity took place until a gritty feel was appreciated.  The specimen was collected on a telfa, some sent for frozen section and some sent for permanent pathology.  The  tenaculum was removed and hemostasis was observed.   The vagina was irrigated.  All instrument, suture, laparotomy, Ray-Tec, and needle counts were correct x2. The patient tolerated the procedure well and was taken recovery room in stable condition.   Valarie Cones MD

## 2021-05-26 NOTE — Interval H&P Note (Signed)
History and Physical Interval Note:  05/26/2021 2:17 PM  Molly [name]  has presented today for surgery, with the diagnosis of SUSPECTED UTERINE CANCER.  The various methods of treatment have been discussed with the patient and family. After consideration of risks, benefits and other options for treatment, the patient has consented to  Procedure(s): DILATATION AND CURETTAGE OF THE UTERUS (N/A) INTRAUTERINE DEVICE (IUD) INSERTION MIRENA (N/A) POSSIBLE HYSTEROSCOPY WITH MYOSURE (N/A) as a surgical intervention.  The patient's history has been reviewed, patient examined, no change in status, stable for surgery.  I have reviewed the patient's chart and labs.  Questions were answered to the patient's satisfaction.     Lafonda Mosses

## 2021-05-26 NOTE — Telephone Encounter (Signed)
New message:    Patient calling to see if there is anything else she needs before her appt today.

## 2021-05-26 NOTE — Telephone Encounter (Signed)
Spoke with pt who states she is having GYN surgery today and wanted to make sure there was nothing else needed from cardiology re: her PPM.  Pt has followed her preop instructions of holding Eliquis.  Pt advised PPM will continue to be monitored as normal and to ensure surgical team is aware she has an implanted PPM.  Pt verbalizes understanding and thanked RN for the call.

## 2021-05-26 NOTE — Anesthesia Procedure Notes (Signed)
Procedure Name: LMA Insertion Date/Time: 05/26/2021 3:04 PM Performed by: Raenette Rover, CRNA Pre-anesthesia Checklist: Patient identified, Emergency Drugs available, Suction available and Patient being monitored Patient Re-evaluated:Patient Re-evaluated prior to induction Oxygen Delivery Method: Circle system utilized Preoxygenation: Pre-oxygenation with 100% oxygen Induction Type: IV induction Ventilation: Mask ventilation without difficulty LMA: LMA inserted LMA Size: 4.0 Number of attempts: 1 Placement Confirmation: positive ETCO2 and breath sounds checked- equal and bilateral Tube secured with: Tape Dental Injury: Teeth and Oropharynx as per pre-operative assessment

## 2021-05-27 ENCOUNTER — Telehealth: Payer: Self-pay

## 2021-05-27 DIAGNOSIS — N858 Other specified noninflammatory disorders of uterus: Secondary | ICD-10-CM

## 2021-05-27 DIAGNOSIS — R9389 Abnormal findings on diagnostic imaging of other specified body structures: Secondary | ICD-10-CM

## 2021-05-27 DIAGNOSIS — N950 Postmenopausal bleeding: Secondary | ICD-10-CM

## 2021-05-27 NOTE — Telephone Encounter (Signed)
Daughter Molly [name] who is Molly [name] stated that her mother would like Dr. Berline Lopes to her daughter Molly [name]  with the final pathology results. Told Molly [name] that this message would be given to Dr. Berline Lopes.

## 2021-05-27 NOTE — Anesthesia Postprocedure Evaluation (Signed)
Anesthesia Post Note  Patient: Molly [name]  Procedure(s) Performed: DILATATION AND CURETTAGE OF THE UTERUS (Uterus) HYSTEROSCOPY (Uterus)     Patient location during evaluation: PACU Anesthesia Type: General Level of consciousness: patient cooperative and awake Pain management: pain level controlled Vital Signs Assessment: post-procedure vital signs reviewed and stable Respiratory status: spontaneous breathing, nonlabored ventilation, respiratory function stable and patient connected to nasal cannula oxygen Cardiovascular status: blood pressure returned to baseline and stable Postop Assessment: no apparent nausea or vomiting Anesthetic complications: no   No notable events documented.  Last Vitals:  Vitals:   05/26/21 1700 05/26/21 1715  BP: 140/80 (!) 156/93  Pulse: 70 70  Resp: 18   Temp:  36.7 C  SpO2: 93% 96%    Last Pain:  Vitals:   05/26/21 1715  TempSrc:   PainSc: 1                  Derion Kreiter

## 2021-05-27 NOTE — Telephone Encounter (Signed)
Spoke with Ms.Winborne  this morning. She states she is eating, drinking and urinating well. Experiencing some burning with urination.  Told her that if the burning does not resolve in the next 24 hrs to call the office as she may need to come to the office and give a urine sample to check for a UTI.She has not had a BM and has not passed gas. She will get  taking senokot-s and take 2 tabs bid and add Miralax 1 capful bib if no BM in the next 24 hours. Encouraged her to drink plenty of water. She denies fever or chills. I Instructed to call office with any fever, chills, purulent drainage,increase in bleeding as she resumed her Eliquis this am '5mg'$  bid.  She had to to change the pad tice last evening,  uncontrolled pain or any other questions or concerns. Patient verbalizes understanding.  Pt aware of post op appointments as well as the office number [phone] and after hours number [phone] to call if she has any questions or concerns

## 2021-05-28 ENCOUNTER — Telehealth: Payer: Self-pay | Admitting: Gynecologic Oncology

## 2021-05-28 ENCOUNTER — Encounter (HOSPITAL_COMMUNITY): Payer: Self-pay | Admitting: Gynecologic Oncology

## 2021-05-28 ENCOUNTER — Encounter: Payer: Self-pay | Admitting: Oncology

## 2021-05-28 NOTE — Telephone Encounter (Signed)
Called the patient daughter and [employer]. No answer. Left VM requesting callback.  Valarie Cones MD

## 2021-05-28 NOTE — Progress Notes (Signed)
Requested Her2 and ER/PR testing on accession [id number] with [facility] via email.

## 2021-05-28 NOTE — Telephone Encounter (Signed)
Spoke with patient's daughter. 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 Will plan to have pathology add ER/PR and HER2. Discussed standard of care treatment (surgery) in this setting as well as the use of chemo and/or RT in metastatic disease or non-operable disease. The patient has previously voiced to her daughter and me that she is not interested in major surgery or other cancer-directed care. She was completely exhausted after her D&C and voiced this again to her daughter Monday evening. We discussed prognosis, what end of life may look like, and the possibility of using palliative RT if bleeding increases. [name] will speak with her mother about this to see if [name] would be accepting of palliative RT. We discussed help at home. She had contacted [facility] [employer] yesterday to get some help in the home when she is at work. We also discussed the possibility of hospice referral. She will call the office to let me know if she'd like me to place a referral once she speaks with her mother. All questions answered.  Valarie Cones MD

## 2021-06-02 ENCOUNTER — Other Ambulatory Visit: Payer: Self-pay | Admitting: Oncology

## 2021-06-02 NOTE — Progress Notes (Signed)
Gynecologic Oncology Multi-Disciplinary Disposition Conference Note  Date of the Conference: 06/02/2021  Patient Name: Molly [name]  Referring Provider: Dr. Sedonia Small Primary GYN Oncologist: Dr. 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 Patient declined staging surgery and opted for no treatment.  Patient will contact us when hospice referral is needed.   This Multidisciplinary conference took place involving physicians from [place], [place], Radiation Oncology, Pathology, Radiology along with the Gynecologic Oncology Nurse Practitioner and RN.  Comprehensive assessment of the patient's malignancy, staging, need for surgery, chemotherapy, radiation therapy, and need for further testing were reviewed. Supportive measures, both inpatient and following discharge were also discussed. The recommended plan of care is documented. Greater than 35 minutes were spent correlating and coordinating this patient's care.

## 2021-06-03 ENCOUNTER — Ambulatory Visit (HOSPITAL_BASED_OUTPATIENT_CLINIC_OR_DEPARTMENT_OTHER): Payer: Medicare HMO | Admitting: Gynecologic Oncology

## 2021-06-03 ENCOUNTER — Encounter: Payer: Self-pay | Admitting: Gynecologic Oncology

## 2021-06-03 DIAGNOSIS — Z9889 Other specified postprocedural states: Secondary | ICD-10-CM

## 2021-06-03 DIAGNOSIS — C549 Malignant neoplasm of corpus uteri, unspecified: Secondary | ICD-10-CM

## 2021-06-03 DIAGNOSIS — Z7189 Other specified counseling: Secondary | ICD-10-CM

## 2021-06-03 NOTE — Progress Notes (Signed)
Gynecologic Oncology Telehealth Consult Note: Gyn-Onc  I connected with [name] on 06/03/21 at  3:30 PM EDT by telephone and verified that I am speaking with the correct person using two identifiers.  I discussed the limitations, risks, security and privacy concerns of performing an evaluation and management service by telemedicine and the availability of in-person appointments. I also discussed with the patient that there may be a patient responsible charge related to this service. The patient expressed understanding and agreed to proceed.  Other persons participating in the visit and their role in the encounter: Daughter, [name], and patient.  Patient's location: Home Provider's location: [facility]  Reason for Visit: Question regarding treatment planning  Treatment History: 8/1: D&C in the setting of postmenopausal bleeding. Pathology revealed high-grade serous carcinoma. HER2 IHC equivocal. ER weak-moderate, progesterone strongly positive.  Interval History: I spoke with the patient's daughter first and then the patient.  [name] reports that she has not talked with her mother today, but on previous phone call since surgery, she has seemed more depressed and "low."  She is unable to tell if this is related to new diagnosis or the cancer itself.  She feels that her mother has been increasingly tired and spending more time in bed.  She has had multiple conversations with her mother although did not address what might happen as the cancer progresses.  I subsequently spoke with the patient, who reports being tired.  She denies any vaginal bleeding since surgery.  She had some rectal bleeding on Saturday and Sunday.  This was after taking a laxative for constipation.  She has had no bleeding since Sunday.  She had a small bowel movement this morning.  Past Medical/Surgical History: Past Medical History:  Diagnosis Date   Aortic insufficiency    Arthritis    Atrial  tachycardia (HCC)    Cancer (HCC)    Skin cancer- basal.  1 mylenoma   Chronic combined systolic and diastolic CHF (congestive heart failure) (HCC)    CKD (chronic kidney disease), stage III (HCC)    Complication of anesthesia    DCM (dilated cardiomyopathy) ([place])    EF 40-45% by echo 2017 - nuclear stress test with no ischemia   GERD (gastroesophageal reflux disease)    H/O benign essential tremor    on amiodarone resolved off amio   H/O cardiac radiofrequency ablation    a. AV node ablation in 05/2019.   H/O hyperkalemia    Resolved off ACE inhibitors   High cholesterol    History of blood transfusion    Hyperkalemia    Hyperparathyroidism ([place])    Hypertension    Mitral regurgitation    trivial by echo 12/2020   Persistent atrial fibrillation (HCC)    PONV (postoperative nausea and vomiting)    Presence of permanent cardiac pacemaker    PVC's (premature ventricular contractions)    Tachycardia-bradycardia syndrome ([place])    s/p PPM 02/2013   TIA (transient ischemic attack)     Past Surgical History:  Procedure Laterality Date   AMPUTATION Right 11/27/2014   Procedure: RIGHT 2ND AND 3RD TOE AMPUTATION;  Surgeon: Wylene Simmer, MD;  Location: [facility];  Service: Orthopedics;  Laterality: Right;   APPENDECTOMY     performed at the time of one of her c-sections   ATRIAL FIBRILLATION ABLATION N/A 05/29/2019   Procedure: AVN ABLATION;  Surgeon: Evans Lance, MD;  Location: [facility];  Service: Cardiovascular;  Laterality: N/A;  BIV UPGRADE N/A 09/27/2019   Procedure: BIV UPGRADE;  Surgeon: Evans Lance, MD;  Location: [facility];  Service: Cardiovascular;  Laterality: N/A;   BUNIONECTOMY     Right   CARDIAC CATHETERIZATION  09/25/2000   EF of 50% -- with normal left ventricular size and function   CARDIOVERSION N/A 12/28/2017   Procedure: CARDIOVERSION;  Surgeon: Dorothy Spark, MD;  Location: [facility] ENDOSCOPY;  Service: Cardiovascular;  Laterality: N/A;    CARDIOVERSION N/A 02/25/2018   Procedure: CARDIOVERSION;  Surgeon: Skeet Latch, MD;  Location: [facility] ENDOSCOPY;  Service: Cardiovascular;  Laterality: N/A;   CESAREAN [place] N/A 05/26/2021   Procedure: HYSTEROSCOPY;  Surgeon: Lafonda Mosses, MD;  Location: [facility];  Service: Gynecology;  Laterality: N/A;   DILATION AND CURETTAGE OF UTERUS N/A 05/26/2021   Procedure: DILATATION AND CURETTAGE OF THE UTERUS;  Surgeon: Lafonda Mosses, MD;  Location: [facility];  Service: Gynecology;  Laterality: N/A;   EYE SURGERY Bilateral    Cataract   FOOT SURGERY     INSERT / REPLACE / REMOVE PACEMAKER  02/23/2013   PACEMAKER INSERTION  02/23/2013   PERMANENT PACEMAKER INSERTION N/A 03/02/2013   Procedure: PERMANENT PACEMAKER INSERTION;  Surgeon: Evans Lance, MD;  Location: [facility] CATH LAB;  Service: Cardiovascular;  Laterality: N/A;   SHOULDER ARTHROSCOPY W/ ROTATOR CUFF REPAIR     SHOULDER SURGERY Right    roto cuff   TOE AMPUTATION Left    2nd and 3rd, bunion under toes.   TONSILLECTOMY     VARICOSE VEIN SURGERY      Family History  Problem Relation Age of Onset   Hypertension Mother    Hypertension Father    Breast cancer Sister    Colon cancer Child    Hypercalcemia Neg Hx     Social History   Socioeconomic History   Marital status: Married    Spouse name: Not on file   Number of children: 3   Years of education: Not on file   Highest education level: Not on file  Occupational History   Not on file  Tobacco Use   Smoking status: Never   Smokeless tobacco: Never  Vaping Use   Vaping Use: Never used  Substance and Sexual Activity   Alcohol use: No   Drug use: No   Sexual activity: Not Currently  Other Topics Concern   Not on file  Social History Narrative   Not on file   Social Determinants of Health   Financial Resource Strain: Not on file  Food Insecurity: Not on file  Transportation Needs:  Not on file  Physical Activity: Not on file  Stress: Not on file  Social Connections: Not on file    Current Medications:  Current Outpatient Medications:    acetaminophen (TYLENOL) 500 MG tablet, Take 1,000 mg by mouth 2 (two) times daily., Disp: , Rfl:    dorzolamide-timolol (COSOPT) 22.3-6.8 MG/ML ophthalmic solution, Place 1 drop into both eyes 2 (two) times daily., Disp: , Rfl:    furosemide (LASIX) 20 MG tablet, Take 1 tablet (20 mg total) by mouth daily. Take an extra tablet (20 mg) as needed (Patient taking differently: Take 20 mg by mouth See admin instructions. Take 78m daily. Take an extra tablet (20 mg) as needed for swelling.), Disp: 135 tablet, Rfl: 3   latanoprost (XALATAN) 0.005 % ophthalmic solution, Place 1 drop into both  eyes at bedtime., Disp: , Rfl:    levocetirizine (XYZAL) 5 MG tablet, Take 5 mg by mouth daily with supper., Disp: , Rfl:    levothyroxine (SYNTHROID) 50 MCG tablet, Take 25 mcg by mouth daily before breakfast., Disp: , Rfl:    LORazepam (ATIVAN) 0.5 MG tablet, TAKE 1 TABLET (0.5 MG TOTAL) BY MOUTH AT BEDTIME. (Patient taking differently: Take 0.25 mg by mouth at bedtime.), Disp: 30 tablet, Rfl: 2   omeprazole (PRILOSEC) 20 MG capsule, Take 20 mg by mouth daily., Disp: , Rfl:    rosuvastatin (CRESTOR) 5 MG tablet, Take 5 mg by mouth daily., Disp: , Rfl:   Review of Symptoms: Pertinent positives include fatigue and constipation.  Physical Exam: There were no vitals taken for this visit. Deferred given limitations of phone visit.  Laboratory & Radiologic Studies: None new  Assessment & Plan: Molly [name] is a 87 y.o. 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  After speaking with both the patient and the daughter, the patient tells me that she has decided not to pursue cancer  directed care in the form of chemotherapy and/or surgery.  She does not feel that she is strong enough to undergo surgery.  She has lots of questions today about how her cancer may progress and what the end of life would look like.  I answered these to the best of my ability.  I addressed, as I had discussed with her daughter on her previous phone call, the possibility that she may have heavier vaginal bleeding at some point.  The patient seems open to the idea of considering palliative radiation if this happens.  We also discussed that part of the goal of deferring cancer directed care would be to keep her symptoms controlled and keep her at home.  One of the best ways that we have to do this is to involve hospice care so that if and when the patient develops symptoms, these can be treated at home and do not require coming into the hospital.  I had previously spoken with [name] about this option, and she will talk with her mother further about it in the next day or 2.  She will, my office know if they would like a referral placed at this time.  In terms of follow-up with me, I discussed the difficulty of having the patient come into clinic.  She has previously voiced how even coming for an outpatient clinic visit exhausts her for the day.  She was amenable to scheduling a phone visit, which we did for mid September.  She knows that she can call my clinic at any point before then with any questions or needs.  I discussed the assessment and treatment plan with the patient. The patient was provided with an opportunity to ask questions and all were answered. The patient agreed with the plan and demonstrated an understanding of the instructions.   The patient was advised to call back or see an in-person evaluation if the symptoms worsen or if the condition fails to improve as anticipated.   28 minutes of total time was spent for this patient encounter, including preparation, over-the-phone counseling with the  patient and coordination of care, and documentation of the encounter.   Jeral Pinch, MD  Division of Gynecologic Oncology  Department of Obstetrics and Gynecology  [facility]

## 2021-06-04 LAB — SURGICAL PATHOLOGY

## 2021-06-05 ENCOUNTER — Telehealth: Payer: Self-pay | Admitting: Oncology

## 2021-06-05 ENCOUNTER — Telehealth: Payer: Self-pay

## 2021-06-05 DIAGNOSIS — C549 Malignant neoplasm of corpus uteri, unspecified: Secondary | ICD-10-CM

## 2021-06-05 NOTE — Telephone Encounter (Signed)
Received phone call from patients daughter Willeen Cass. She states that she and her mom have spoken since Dr. Lulu Riding phone visit on Tuesday and have decided to pursue hospice. Dr. Berline Lopes and Elmo Putt have been notified. Instructed [name] that we will start the process and someone from hospice will be reaching out to her. She verbalized understanding and will call with any questions.

## 2021-06-05 NOTE — Telephone Encounter (Signed)
Called [name] (daughter) regarding hospice referral.  She would like a referral called in to [facility] and to have them contact her.  La[place] and spoke to [place].  She will reach out to [facility] to set up an appointment.

## 2021-06-09 ENCOUNTER — Other Ambulatory Visit: Payer: Self-pay | Admitting: Gynecologic Oncology

## 2021-06-09 ENCOUNTER — Telehealth: Payer: Self-pay

## 2021-06-09 DIAGNOSIS — N939 Abnormal uterine and vaginal bleeding, unspecified: Secondary | ICD-10-CM

## 2021-06-09 MED ORDER — MEDROXYPROGESTERONE ACETATE 10 MG PO TABS: 10.0000 mg | ORAL_TABLET | Freq: Every day | ORAL | 1 refills | Status: AC

## 2021-06-09 NOTE — Telephone Encounter (Signed)
Reviewed with [name] that Provera is not the typical treatment for the cancer. The provera should help to slow or stop the bleeding. If after a week of taking the medication it does not help she can stop taking the medication. Reviewed the risk of blood clots and signs to look for. She verbalized understanding and will call with any questions or concerns.

## 2021-06-09 NOTE — Telephone Encounter (Signed)
Received call from patients daughter Molly [name]. She reports that patient had very heavy vaginal bleeding over the weekend. She did not know how many pads she was soaking thru. The patient has episodes of dizziness and [name] is concerned that her moms hemoglobin is low. Hospice is scheduled to come out for the initial site visit this Wednesday 8/17 and she is wondering if they can check her hemoglobin or if she needs to bring her mom in to have it checked. She reports the bleeding has slowed down today but is still there. She states they are not ready for palliative radiation.   Spoke with Dr. Berline Lopes, she does not feel checking a CBC is necessary at this time. She is going to call in Provera '10mg'$  once daily to help with the bleeding. Patient and daughter Molly [name] are agreeable to this. Instructed to call with any questions or concerns.

## 2021-06-09 NOTE — Progress Notes (Signed)
See RN note. Per Dr. Berline Lopes, plan for patient to begin taking provera 10 mg daily to see if the uterine bleeding will slow or stop.

## 2021-06-18 ENCOUNTER — Other Ambulatory Visit: Payer: Self-pay | Admitting: Gynecologic Oncology

## 2021-06-18 DIAGNOSIS — N939 Abnormal uterine and vaginal bleeding, unspecified: Secondary | ICD-10-CM

## 2021-06-27 ENCOUNTER — Ambulatory Visit (INDEPENDENT_AMBULATORY_CARE_PROVIDER_SITE_OTHER)

## 2021-06-27 DIAGNOSIS — I495 Sick sinus syndrome: Secondary | ICD-10-CM | POA: Diagnosis not present

## 2021-07-01 LAB — CUP PACEART REMOTE DEVICE CHECK
Battery Remaining Longevity: 132 mo
Battery Remaining Percentage: 100 %
Brady Statistic RA Percent Paced: 0 %
Brady Statistic RV Percent Paced: 97 %
Date Time Interrogation Session: 20220902035200
Implantable Lead Implant Date: 20140508
Implantable Lead Implant Date: 20140508
Implantable Lead Implant Date: 20201202
Implantable Lead Location: 753858
Implantable Lead Location: 753859
Implantable Lead Location: 753860
Implantable Lead Model: 4135
Implantable Lead Model: 4136
Implantable Lead Model: 4674
Implantable Lead Serial Number: 29333020
Implantable Lead Serial Number: 29379474
Implantable Lead Serial Number: 851686
Implantable Pulse Generator Implant Date: 20201202
Lead Channel Impedance Value: 693 Ohm
Lead Channel Impedance Value: 825 Ohm
Lead Channel Pacing Threshold Amplitude: 0.7 V
Lead Channel Pacing Threshold Amplitude: 0.8 V
Lead Channel Pacing Threshold Pulse Width: 0.4 ms
Lead Channel Pacing Threshold Pulse Width: 0.4 ms
Lead Channel Setting Pacing Amplitude: 2 V
Lead Channel Setting Pacing Amplitude: 2.5 V
Lead Channel Setting Pacing Pulse Width: 0.4 ms
Lead Channel Setting Pacing Pulse Width: 0.4 ms
Lead Channel Setting Sensing Sensitivity: 2.5 mV
Lead Channel Setting Sensing Sensitivity: 3 mV
Pulse Gen Serial Number: 750525

## 2021-07-02 ENCOUNTER — Telehealth: Payer: Self-pay

## 2021-07-02 NOTE — Telephone Encounter (Signed)
Returning call to [facility] (patients daughter), she is requesting more information about Molly [name]'s cancer. Molly [name] reports that Molly [name] has been in hospice for 3 weeks now and her dad who has dementia is in denial about Molly [name]'s diagnosis. Molly [name] would like to tell her dad the exact type of cancer she has to help his understanding. Molly [name] is [employer] power of attorney.Per Molly [name]'s request a copy of Molly [name]'s pathology report with diagnosis has been sent to [email or web address].  Instructed to call with any questions or concerns.

## 2021-07-08 ENCOUNTER — Inpatient Hospital Stay: Payer: Medicare HMO | Attending: Gynecologic Oncology | Admitting: Gynecologic Oncology

## 2021-07-08 ENCOUNTER — Encounter: Payer: Self-pay | Admitting: Gynecologic Oncology

## 2021-07-08 DIAGNOSIS — C549 Malignant neoplasm of corpus uteri, unspecified: Secondary | ICD-10-CM

## 2021-07-08 NOTE — Progress Notes (Signed)
Remote pacemaker transmission.   

## 2021-07-08 NOTE — Progress Notes (Signed)
Gynecologic Oncology Telehealth Consult Note: Gyn-Onc  I connected with Molly [name] on 07/08/21 at  4:00 PM EDT by telephone and verified that I am speaking with the correct person using two identifiers.  I discussed the limitations, risks, security and privacy concerns of performing an evaluation and management service by telemedicine and the availability of in-person appointments. I also discussed with the patient that there may be a patient responsible charge related to this service. The patient expressed understanding and agreed to proceed.  Other persons participating in the visit and their role in the encounter: none.  Patient's location: home Provider's location: [facility]  Reason for Visit: follow-up of symptoms  Treatment History: 8/1: D&C in the setting of postmenopausal bleeding. Pathology revealed high-grade serous carcinoma. HER2 IHC equivocal. ER weak-moderate, progesterone strongly positive.  Interval History: Since her last visit, the patient notes increasing fatigue, often limiting her ability to do much during the day.  She denies any vaginal bleeding.  She reports minimal if any abdominal and pelvic pain.  She has some constipation and is using laxatives as needed although sometimes this causes diarrhea.  She denies any urinary symptoms.  Hospice is coming multiple times a week, with an aide coming 3 times a week and a nurse coming once a week.  She feels like she has adequate support.  Past Medical/Surgical History: Past Medical History:  Diagnosis Date   Aortic insufficiency    Arthritis    Atrial tachycardia (HCC)    Cancer (HCC)    Skin cancer- basal.  1 mylenoma   Chronic combined systolic and diastolic CHF (congestive heart failure) (HCC)    CKD (chronic kidney disease), stage III (HCC)    Complication of anesthesia    DCM (dilated cardiomyopathy) ([place])    EF 40-45% by echo 2017 - nuclear stress test with no ischemia   GERD (gastroesophageal reflux disease)     H/O benign essential tremor    on amiodarone resolved off amio   H/O cardiac radiofrequency ablation    a. AV node ablation in 05/2019.   H/O hyperkalemia    Resolved off ACE inhibitors   High cholesterol    History of blood transfusion    Hyperkalemia    Hyperparathyroidism ([place])    Hypertension    Mitral regurgitation    trivial by echo 12/2020   Persistent atrial fibrillation (HCC)    PONV (postoperative nausea and vomiting)    Presence of permanent cardiac pacemaker    PVC's (premature ventricular contractions)    Tachycardia-bradycardia syndrome ([place])    s/p PPM 02/2013   TIA (transient ischemic attack)     Past Surgical History:  Procedure Laterality Date   AMPUTATION Right 11/27/2014   Procedure: RIGHT 2ND AND 3RD TOE AMPUTATION;  Surgeon: Wylene Simmer, MD;  Location: [facility];  Service: Orthopedics;  Laterality: Right;   APPENDECTOMY     performed at the time of one of her c-sections   ATRIAL FIBRILLATION ABLATION N/A 05/29/2019   Procedure: AVN ABLATION;  Surgeon: Evans Lance, MD;  Location: [facility];  Service: Cardiovascular;  Laterality: N/A;   BIV UPGRADE N/A 09/27/2019   Procedure: BIV UPGRADE;  Surgeon: Evans Lance, MD;  Location: [facility];  Service: Cardiovascular;  Laterality: N/A;   BUNIONECTOMY     Right   CARDIAC CATHETERIZATION  09/25/2000   EF of 50% -- with normal left ventricular size and function   CARDIOVERSION N/A 12/28/2017   Procedure: CARDIOVERSION;  Surgeon: Dorothy Spark, MD;  Location: [facility] ENDOSCOPY;  Service: Cardiovascular;  Laterality: N/A;   CARDIOVERSION N/A 02/25/2018   Procedure: CARDIOVERSION;  Surgeon: Skeet Latch, MD;  Location: [facility] ENDOSCOPY;  Service: Cardiovascular;  Laterality: N/A;   CESAREAN [place] N/A 05/26/2021   Procedure: HYSTEROSCOPY;  Surgeon: Lafonda Mosses, MD;  [facility];  Service: Gynecology;   Laterality: N/A;   DILATION AND CURETTAGE OF UTERUS N/A 05/26/2021   Procedure: DILATATION AND CURETTAGE OF THE UTERUS;  Surgeon: Lafonda Mosses, MD;  [facility];  Service: Gynecology;  Laterality: N/A;   EYE SURGERY Bilateral    Cataract   FOOT SURGERY     INSERT / REPLACE / REMOVE PACEMAKER  02/23/2013   PACEMAKER INSERTION  02/23/2013   PERMANENT PACEMAKER INSERTION N/A 03/02/2013   Procedure: PERMANENT PACEMAKER INSERTION;  Surgeon: Evans Lance, MD;  Location: [facility] CATH LAB;  Service: Cardiovascular;  Laterality: N/A;   SHOULDER ARTHROSCOPY W/ ROTATOR CUFF REPAIR     SHOULDER SURGERY Right    roto cuff   TOE AMPUTATION Left    2nd and 3rd, bunion under toes.   TONSILLECTOMY     VARICOSE VEIN SURGERY      Family History  Problem Relation Age of Onset   Hypertension Mother    Hypertension Father    Breast cancer Sister    Colon cancer Child    Hypercalcemia Neg Hx     Social History   Socioeconomic History   Marital status: Married    Spouse name: Not on file   Number of children: 3   Years of education: Not on file   Highest education level: Not on file  Occupational History   Not on file  Tobacco Use   Smoking status: Never   Smokeless tobacco: Never  Vaping Use   Vaping Use: Never used  Substance and Sexual Activity   Alcohol use: No   Drug use: No   Sexual activity: Not Currently  Other Topics Concern   Not on file  Social History Narrative   Not on file   Social Determinants of Health   Financial Resource Strain: Not on file  Food Insecurity: Not on file  Transportation Needs: Not on file  Physical Activity: Not on file  Stress: Not on file  Social Connections: Not on file    Current Medications:  Current Outpatient Medications:    acetaminophen (TYLENOL) 500 MG tablet, Take 1,000 mg by mouth 2 (two) times daily., Disp: , Rfl:    dorzolamide-timolol (COSOPT) 22.3-6.8 MG/ML ophthalmic solution, Place 1 drop into both eyes 2 (two) times  daily., Disp: , Rfl:    furosemide (LASIX) 20 MG tablet, Take 1 tablet (20 mg total) by mouth daily. Take an extra tablet (20 mg) as needed (Patient taking differently: Take 20 mg by mouth See admin instructions. Take 19m daily. Take an extra tablet (20 mg) as needed for swelling.), Disp: 135 tablet, Rfl: 3   latanoprost (XALATAN) 0.005 % ophthalmic solution, Place 1 drop into both eyes at bedtime., Disp: , Rfl:    levocetirizine (XYZAL) 5 MG tablet, Take 5 mg by mouth daily with supper., Disp: , Rfl:    levothyroxine (SYNTHROID) 50 MCG tablet, Take 25 mcg by mouth daily before breakfast., Disp: , Rfl:    LORazepam (ATIVAN) 0.5 MG tablet, TAKE 1 TABLET (0.5 MG TOTAL) BY MOUTH AT BEDTIME. (Patient taking  differently: Take 0.25 mg by mouth at bedtime.), Disp: 30 tablet, Rfl: 2   medroxyPROGESTERone (PROVERA) 10 MG tablet, Take 1 tablet (10 mg total) by mouth daily., Disp: 15 tablet, Rfl: 1   omeprazole (PRILOSEC) 20 MG capsule, Take 20 mg by mouth daily., Disp: , Rfl:    rosuvastatin (CRESTOR) 5 MG tablet, Take 5 mg by mouth daily., Disp: , Rfl:   Review of Symptoms: Pertinent positives as per HPI.  Physical Exam: There were no vitals taken for this visit. Deferred given limitations of phone visit.  Laboratory & Radiologic Studies: None new  Assessment & Plan: Molly [name] is a 87 y.o. 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   Patient has overall remained fairly asymptomatic with the exception of her fatigue, which was significant at the time that I first met her.  We discussed that this will likely worsen with time.  She and I have had multiple conversations about what to expect in terms of symptoms that could develop.  She feels that support at this time is adequate from hospice.  She will call if anything comes up that she needs help  with before her next visit with me.  She like to have another phone visit in a month.  I discussed the assessment and treatment plan with the patient. The patient was provided with an opportunity to ask questions and all were answered. The patient agreed with the plan and demonstrated an understanding of the instructions.   The patient was advised to call back or see an in-person evaluation if the symptoms worsen or if the condition fails to improve as anticipated.   24 minutes of total time was spent for this patient encounter, including preparation, over-the phone counseling with the patient and coordination of care, and documentation of the encounter.   Jeral Pinch, MD  Division of Gynecologic Oncology  Department of Obstetrics and Gynecology  [facility]

## 2021-07-14 ENCOUNTER — Telehealth: Payer: Self-pay | Admitting: *Deleted

## 2021-07-14 NOTE — Telephone Encounter (Signed)
Called and moved her appt from 10/11 to 10/10

## 2021-08-04 ENCOUNTER — Inpatient Hospital Stay: Payer: Medicare HMO | Attending: Gynecologic Oncology | Admitting: Gynecologic Oncology

## 2021-08-04 ENCOUNTER — Encounter: Payer: Self-pay | Admitting: Gynecologic Oncology

## 2021-08-04 DIAGNOSIS — C549 Malignant neoplasm of corpus uteri, unspecified: Secondary | ICD-10-CM

## 2021-08-04 NOTE — Progress Notes (Signed)
Gynecologic Oncology Telehealth Consult Note: Gyn-Onc  I connected with [name] on 08/04/21 at  4:15 PM EDT by telephone and verified that I am speaking with the correct person using two identifiers.  I discussed the limitations, risks, security and privacy concerns of performing an evaluation and management service by telemedicine and the availability of in-person appointments. I also discussed with the patient that there may be a patient responsible charge related to this service. The patient expressed understanding and agreed to proceed.  Other persons participating in the visit and their role in the encounter: None.  Patient's location: Home Provider's location: [facility]  Reason for Visit: Symptom check in  Treatment History: 8/1: D&C in the setting of postmenopausal bleeding. Pathology revealed high-grade serous carcinoma. HER2 IHC equivocal. ER weak-moderate, progesterone strongly positive.  Interval History: Reports that she feels like things are "going downhill quickly".  She notes continued worsening of her significant fatigue and is spending most of the day in bed.  She continues to endorse minimal abdominal pain although does have some intermittent back pain.  She is tolerating some p.o. intake without nausea or emesis.  She denies any urinary symptoms.  Has small bowel movements every time she voids at the bedside commode.  Hospice is continuing to come in multiple times a week.  She had some vaginal bleeding yesterday for the first time since her last conversation.  Past Medical/Surgical History: Past Medical History:  Diagnosis Date   Aortic insufficiency    Arthritis    Atrial tachycardia (HCC)    Cancer (HCC)    Skin cancer- basal.  1 mylenoma   Chronic combined systolic and diastolic CHF (congestive heart failure) (HCC)    CKD (chronic kidney disease), stage III (HCC)    Complication of anesthesia    DCM (dilated cardiomyopathy) ([place])    EF  40-45% by echo 2017 - nuclear stress test with no ischemia   GERD (gastroesophageal reflux disease)    H/O benign essential tremor    on amiodarone resolved off amio   H/O cardiac radiofrequency ablation    a. AV node ablation in 05/2019.   H/O hyperkalemia    Resolved off ACE inhibitors   High cholesterol    History of blood transfusion    Hyperkalemia    Hyperparathyroidism ([place])    Hypertension    Mitral regurgitation    trivial by echo 12/2020   Persistent atrial fibrillation (HCC)    PONV (postoperative nausea and vomiting)    Presence of permanent cardiac pacemaker    PVC's (premature ventricular contractions)    Tachycardia-bradycardia syndrome ([place])    s/p PPM 02/2013   TIA (transient ischemic attack)     Past Surgical History:  Procedure Laterality Date   AMPUTATION Right 11/27/2014   Procedure: RIGHT 2ND AND 3RD TOE AMPUTATION;  Surgeon: Wylene Simmer, MD;  Location: [place];  Service: Orthopedics;  Laterality: Right;   APPENDECTOMY     performed at the time of one of her c-sections   ATRIAL FIBRILLATION ABLATION N/A 05/29/2019   Procedure: AVN ABLATION;  Surgeon: Evans Lance, MD;  Location: [place] [facility];  Service: Cardiovascular;  Laterality: N/A;   BIV UPGRADE N/A 09/27/2019   Procedure: BIV UPGRADE;  Surgeon: Evans Lance, MD;  Location: [place] [facility];  Service: Cardiovascular;  Laterality: N/A;   BUNIONECTOMY     Right   CARDIAC CATHETERIZATION  09/25/2000   EF of 50% -- with normal left  ventricular size and function   CARDIOVERSION N/A 12/28/2017   Procedure: CARDIOVERSION;  Surgeon: Dorothy Spark, MD;  Location[facility] ENDOSCOPY;  Service: Cardiovascular;  Laterality: N/A;   CARDIOVERSION N/A 02/25/2018   Procedure: CARDIOVERSION;  Surgeon: Skeet Latch, MD;  Location: [facility] ENDOSCOPY;  Service: Cardiovascular;  Laterality: N/A;   CESAREAN [place] N/A 05/26/2021    Procedure: HYSTEROSCOPY;  Surgeon: Lafonda Mosses, MD;  Location: [facility];  Service: Gynecology;  Laterality: N/A;   DILATION AND CURETTAGE OF UTERUS N/A 05/26/2021   Procedure: DILATATION AND CURETTAGE OF THE UTERUS;  Surgeon: Lafonda Mosses, MD;  Location: [facility];  Service: Gynecology;  Laterality: N/A;   EYE SURGERY Bilateral    Cataract   FOOT SURGERY     INSERT / REPLACE / REMOVE PACEMAKER  02/23/2013   PACEMAKER INSERTION  02/23/2013   PERMANENT PACEMAKER INSERTION N/A 03/02/2013   Procedure: PERMANENT PACEMAKER INSERTION;  Surgeon: Evans Lance, MD;  Location: [facility] CATH LAB;  Service: Cardiovascular;  Laterality: N/A;   SHOULDER ARTHROSCOPY W/ ROTATOR CUFF REPAIR     SHOULDER SURGERY Right    roto cuff   TOE AMPUTATION Left    2nd and 3rd, bunion under toes.   TONSILLECTOMY     VARICOSE VEIN SURGERY      Family History  Problem Relation Age of Onset   Hypertension Mother    Hypertension Father    Breast cancer Sister    Colon cancer Child    Hypercalcemia Neg Hx     Social History   Socioeconomic History   Marital status: Married    Spouse name: Not on file   Number of children: 3   Years of education: Not on file   Highest education level: Not on file  Occupational History   Not on file  Tobacco Use   Smoking status: Never   Smokeless tobacco: Never  Vaping Use   Vaping Use: Never used  Substance and Sexual Activity   Alcohol use: No   Drug use: No   Sexual activity: Not Currently  Other Topics Concern   Not on file  Social History Narrative   Not on file   Social Determinants of Health   Financial Resource Strain: Not on file  Food Insecurity: Not on file  Transportation Needs: Not on file  Physical Activity: Not on file  Stress: Not on file  Social Connections: Not on file    Current Medications:  Current Outpatient Medications:    acetaminophen (TYLENOL) 500 MG tablet, Take 1,000 mg by mouth 2 (two) times daily., Disp: , Rfl:     dorzolamide-timolol (COSOPT) 22.3-6.8 MG/ML ophthalmic solution, Place 1 drop into both eyes 2 (two) times daily., Disp: , Rfl:    furosemide (LASIX) 20 MG tablet, Take 1 tablet (20 mg total) by mouth daily. Take an extra tablet (20 mg) as needed (Patient taking differently: Take 20 mg by mouth See admin instructions. Take 20mg  daily. Take an extra tablet (20 mg) as needed for swelling.), Disp: 135 tablet, Rfl: 3   latanoprost (XALATAN) 0.005 % ophthalmic solution, Place 1 drop into both eyes at bedtime., Disp: , Rfl:    levocetirizine (XYZAL) 5 MG tablet, Take 5 mg by mouth daily with supper., Disp: , Rfl:    levothyroxine (SYNTHROID) 50 MCG tablet, Take 25 mcg by mouth daily before breakfast., Disp: , Rfl:    LORazepam (ATIVAN) 0.5  MG tablet, TAKE 1 TABLET (0.5 MG TOTAL) BY MOUTH AT BEDTIME. (Patient taking differently: Take 0.25 mg by mouth at bedtime.), Disp: 30 tablet, Rfl: 2   medroxyPROGESTERone (PROVERA) 10 MG tablet, Take 1 tablet (10 mg total) by mouth daily., Disp: 15 tablet, Rfl: 1   omeprazole (PRILOSEC) 20 MG capsule, Take 20 mg by mouth daily., Disp: , Rfl:    rosuvastatin (CRESTOR) 5 MG tablet, Take 5 mg by mouth daily., Disp: , Rfl:   Review of Symptoms: Pertinent positives as per HPI.  Physical Exam: There were no vitals taken for this visit. Deferred given limitations of phone visit  Laboratory & Radiologic Studies: None new  Assessment & Plan: Molly [name] is a 87 y.o. 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  Patient continues to endorse decline in her functional status.  She is spending more more time in bed.  Her symptoms continue to seem adequately controlled.  She has been hesitant to use pain medication because she denies having much pain and is worried about side effects.  I encouraged her to use pain medication  if and when she needs.  She voices feeling like she has adequate help at home between family and hospice.  She knows that she should and can reach out to my clinic at any time if she needs anything.  She would like to schedule a phone visit for check-in in 1 month.  I discussed the assessment and treatment plan with the patient. The patient was provided with an opportunity to ask questions and all were answered. The patient agreed with the plan and demonstrated an understanding of the instructions.   The patient was advised to call back or see an in-person evaluation if the symptoms worsen or if the condition fails to improve as anticipated.   22 minutes of total time was spent for this patient encounter, including preparation, face-to-face counseling with the patient and coordination of care, and documentation of the encounter.   Jeral Pinch, MD  Division of Gynecologic Oncology  Department of Obstetrics and Gynecology  [facility]

## 2021-08-05 ENCOUNTER — Ambulatory Visit: Admitting: Gynecologic Oncology

## 2021-08-26 DEATH — deceased

## 2021-09-08 ENCOUNTER — Inpatient Hospital Stay: Payer: Medicare HMO | Admitting: Gynecologic Oncology

## 2021-09-08 ENCOUNTER — Telehealth: Payer: Self-pay | Admitting: Gynecologic Oncology

## 2021-09-08 NOTE — Telephone Encounter (Signed)
Called patient's primary contact for our scheduled phone visit today.  Spoke with a gentleman who let me know that Trenell passed away 2 weeks ago.  Jeral Pinch MD Gynecologic Oncology

## 2022-06-10 IMAGING — CT CT HEAD W/O CM
3 series · 16 of 47 positions shown, 19 images · non-contrast
Comparison: 02/22/2017

CLINICAL DATA: Slurred speech and dizziness

EXAM:
CT HEAD WITHOUT CONTRAST
TECHNIQUE: Contiguous axial images were obtained from the base of the skull
through the vertex without intravenous contrast.

[Series 4: head 5.0 h30s · axial · 0.43mm/px · z∈[-78,+47]mm · 10 of 31 slices shown, 13 images]
[im 3/31  brain]
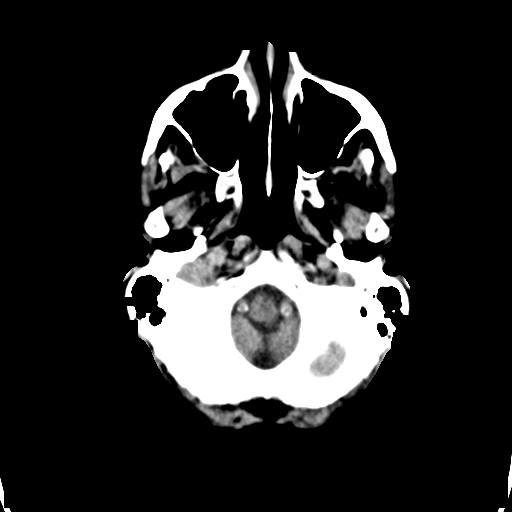
[im 3/31  bone]
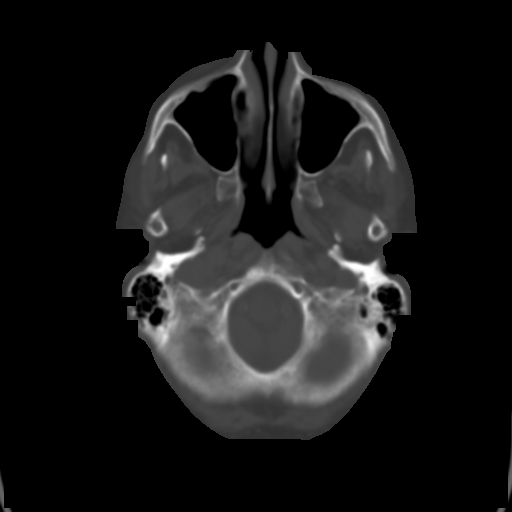
[im 6/31  brain]
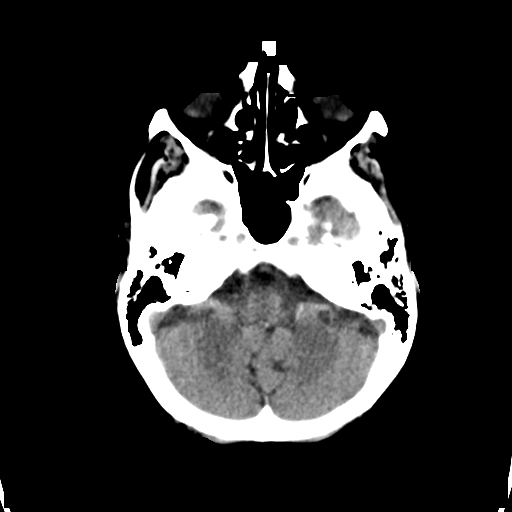
[im 9/31  brain]
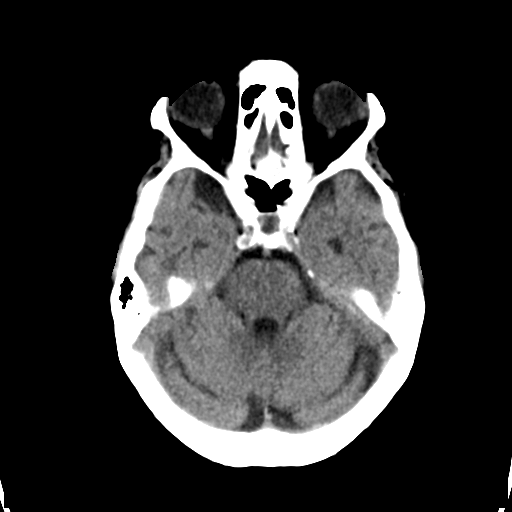
[im 11/31  brain]
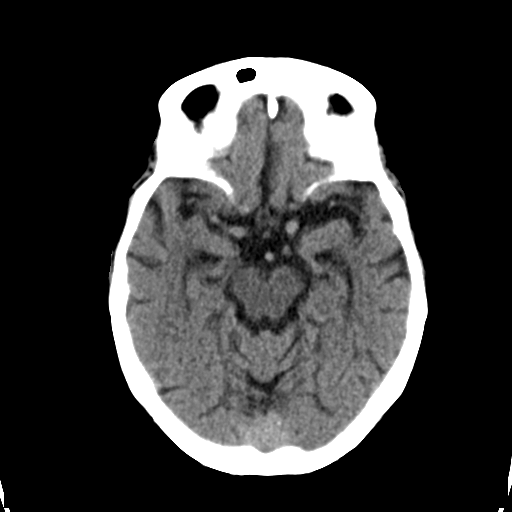
[im 14/31  brain]
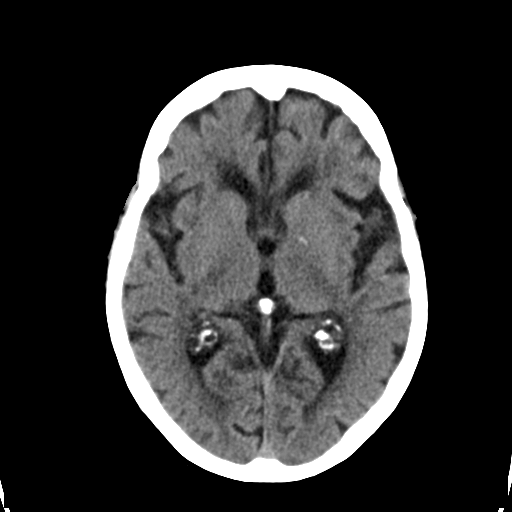
[im 14/31  bone]
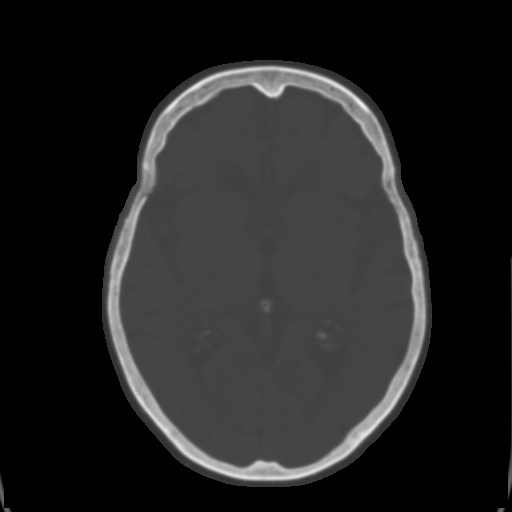
[im 17/31  brain]
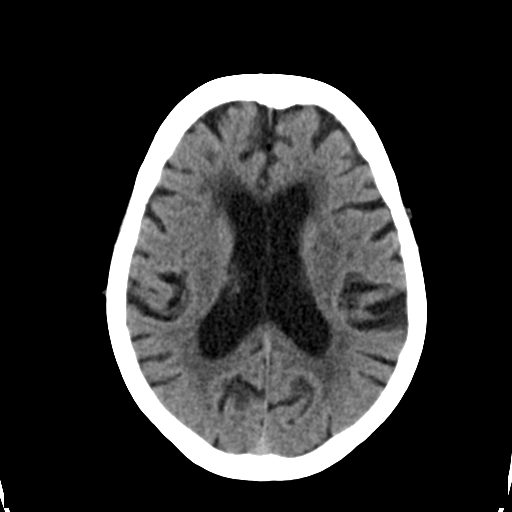
[im 20/31  brain]
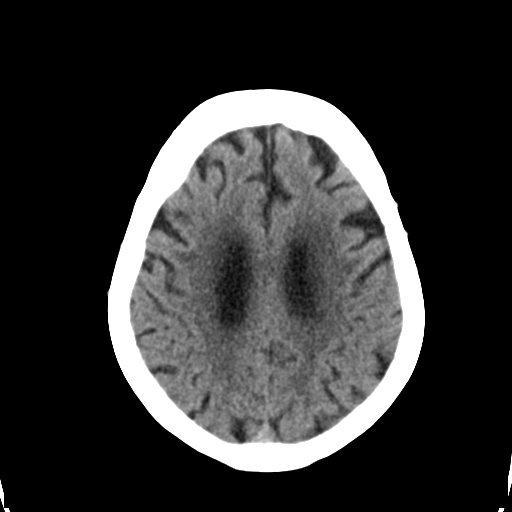
[im 23/31  brain]
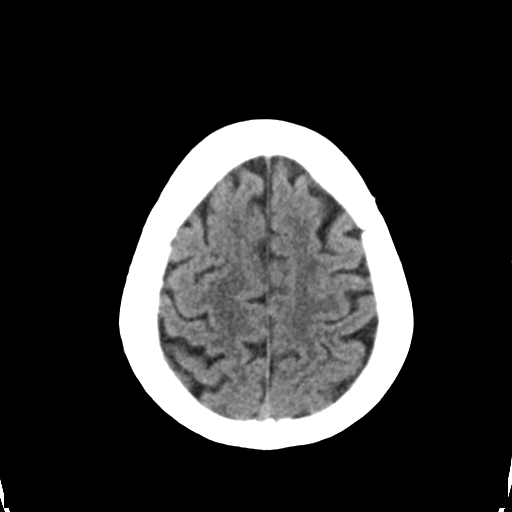
[im 25/31  brain]
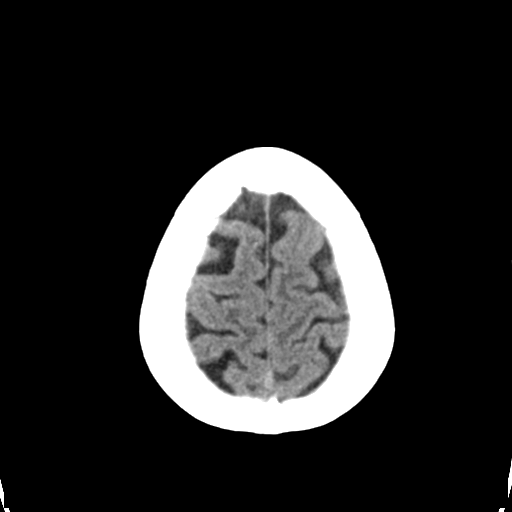
[im 25/31  bone]
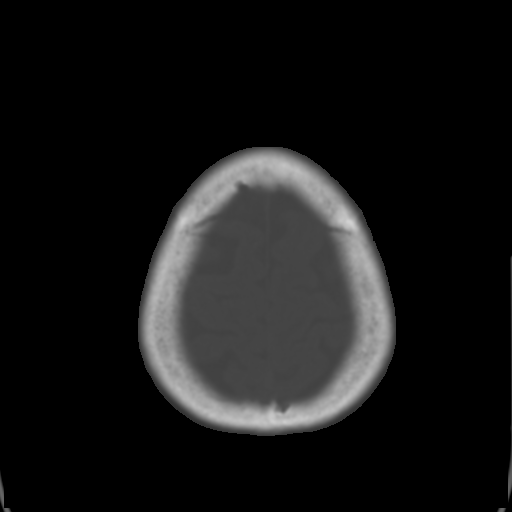
[im 28/31  brain]
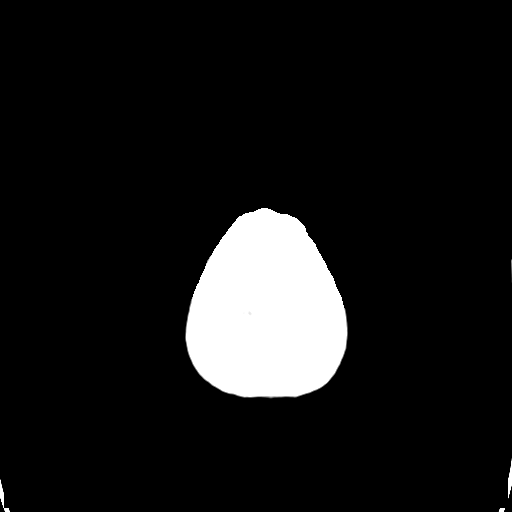

[Series 5: head 3.0 mpr cor · coronal · 0.30mm/px · 3 of 66 slices shown]
[im 22/66  brain]
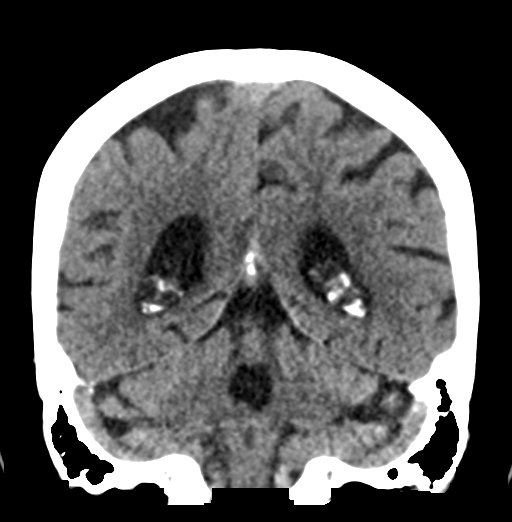
[im 29/66  brain]
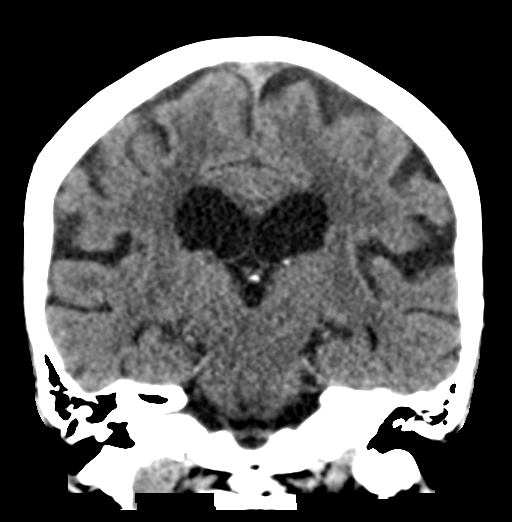
[im 37/66  brain]
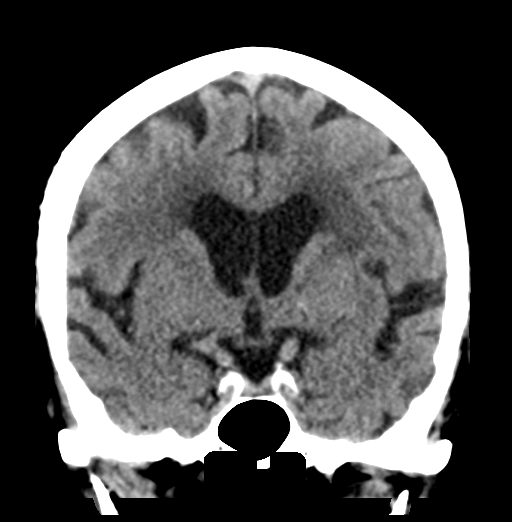

[Series 6: head 3.0 mpr sag · sagittal · 0.31mm/px · 3 of 50 slices shown]
[im 17/50  brain]
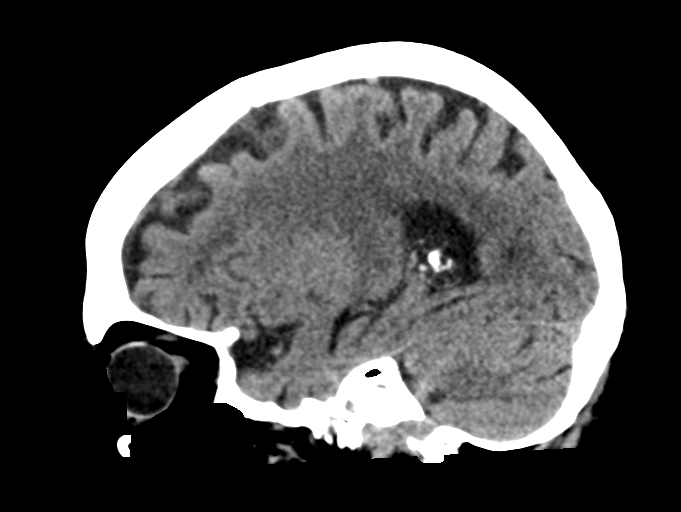
[im 25/50  brain]
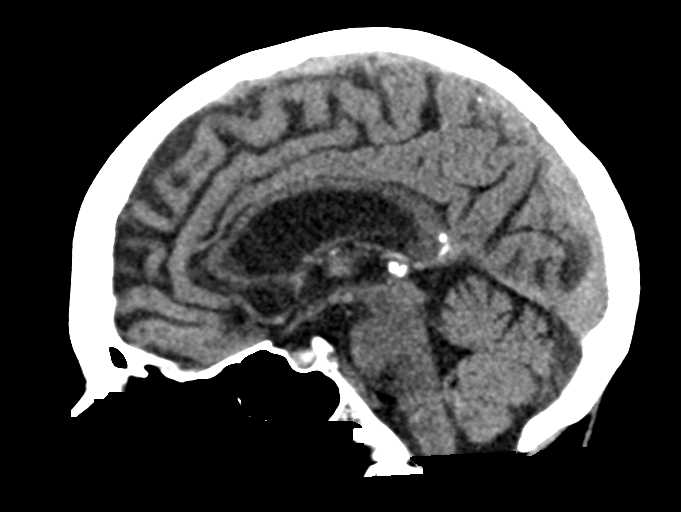
[im 33/50  brain]
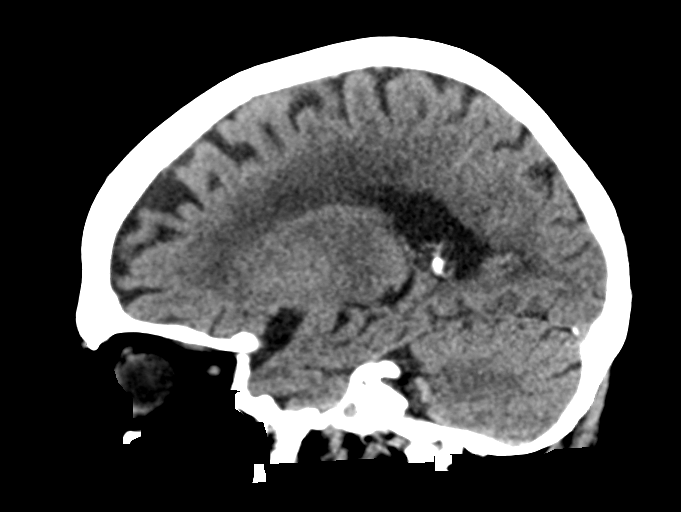

[16 of 47 positions shown; findings below may reference images not displayed]

FINDINGS: Brain: No evidence of acute infarction, hemorrhage, hydrocephalus,
extra-axial collection or mass lesion/mass effect. Chronic atrophic
and ischemic changes are noted.

Vascular: No hyperdense vessel or unexpected calcification.

Skull: Normal. Negative for fracture or focal lesion.

Sinuses/Orbits: No acute finding.

Other: None.
IMPRESSION: Chronic atrophic and ischemic changes. No acute abnormality is
noted.
